# Patient Record
Sex: Male | Born: 1950 | Race: Black or African American | Hispanic: No | Marital: Married | Smoking: Former smoker
Health system: Southern US, Community
[De-identification: ages and names within clinical notes are randomized; demographics above are authoritative.]

## PROBLEM LIST (undated history)

## (undated) DIAGNOSIS — I2510 Atherosclerotic heart disease of native coronary artery without angina pectoris: Secondary | ICD-10-CM

## (undated) DIAGNOSIS — I10 Essential (primary) hypertension: Secondary | ICD-10-CM

## (undated) DIAGNOSIS — R002 Palpitations: Secondary | ICD-10-CM

## (undated) DIAGNOSIS — M1710 Unilateral primary osteoarthritis, unspecified knee: Secondary | ICD-10-CM

## (undated) DIAGNOSIS — Z87442 Personal history of urinary calculi: Secondary | ICD-10-CM

## (undated) DIAGNOSIS — M545 Low back pain, unspecified: Secondary | ICD-10-CM

## (undated) DIAGNOSIS — T7840XA Allergy, unspecified, initial encounter: Secondary | ICD-10-CM

## (undated) DIAGNOSIS — K4090 Unilateral inguinal hernia, without obstruction or gangrene, not specified as recurrent: Secondary | ICD-10-CM

## (undated) DIAGNOSIS — E538 Deficiency of other specified B group vitamins: Secondary | ICD-10-CM

## (undated) DIAGNOSIS — E785 Hyperlipidemia, unspecified: Secondary | ICD-10-CM

## (undated) DIAGNOSIS — E119 Type 2 diabetes mellitus without complications: Secondary | ICD-10-CM

## (undated) DIAGNOSIS — J392 Other diseases of pharynx: Secondary | ICD-10-CM

## (undated) DIAGNOSIS — I779 Disorder of arteries and arterioles, unspecified: Secondary | ICD-10-CM

## (undated) HISTORY — PX: CARDIAC CATHETERIZATION: SHX172

## (undated) HISTORY — DX: Unilateral primary osteoarthritis, unspecified knee: M17.10

## (undated) HISTORY — DX: Hyperlipidemia, unspecified: E78.5

## (undated) HISTORY — DX: Deficiency of other specified B group vitamins: E53.8

## (undated) HISTORY — DX: Low back pain, unspecified: M54.50

## (undated) HISTORY — DX: Essential (primary) hypertension: I10

## (undated) HISTORY — DX: Allergy, unspecified, initial encounter: T78.40XA

## (undated) HISTORY — PX: KNEE ARTHROSCOPY: SHX127

## (undated) HISTORY — DX: Low back pain: M54.5

## (undated) HISTORY — DX: Atherosclerotic heart disease of native coronary artery without angina pectoris: I25.10

## (undated) HISTORY — DX: Disorder of arteries and arterioles, unspecified: I77.9

## (undated) HISTORY — PX: INGUINAL HERNIA REPAIR: SHX194

## (undated) NOTE — *Deleted (*Deleted)
Responded to page, pt unavailable, no family present, staff will call if further chaplain services needed.   Rev. Donnel Saxon Chaplain

---
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
## 1999-01-07 ENCOUNTER — Encounter: Admission: RE | Admit: 1999-01-07 | Discharge: 1999-04-07 | Payer: Self-pay | Admitting: Orthopedic Surgery

## 1999-04-18 ENCOUNTER — Encounter: Payer: Self-pay | Admitting: Orthopedic Surgery

## 1999-04-20 ENCOUNTER — Ambulatory Visit (HOSPITAL_COMMUNITY): Admission: RE | Admit: 1999-04-20 | Discharge: 1999-04-20 | Payer: Self-pay | Admitting: Orthopedic Surgery

## 1999-05-10 ENCOUNTER — Encounter: Admission: RE | Admit: 1999-05-10 | Discharge: 1999-08-04 | Payer: Self-pay | Admitting: Orthopedic Surgery

## 2000-05-02 ENCOUNTER — Encounter: Payer: Self-pay | Admitting: Emergency Medicine

## 2000-05-02 ENCOUNTER — Emergency Department (HOSPITAL_COMMUNITY): Admission: EM | Admit: 2000-05-02 | Discharge: 2000-05-02 | Payer: Self-pay | Admitting: Emergency Medicine

## 2001-05-29 ENCOUNTER — Ambulatory Visit (HOSPITAL_COMMUNITY): Admission: RE | Admit: 2001-05-29 | Discharge: 2001-05-29 | Payer: Self-pay | Admitting: Cardiology

## 2001-05-29 ENCOUNTER — Encounter: Payer: Self-pay | Admitting: Cardiology

## 2004-01-04 ENCOUNTER — Encounter: Admission: RE | Admit: 2004-01-04 | Discharge: 2004-01-04 | Payer: Self-pay | Admitting: Cardiology

## 2004-01-08 DIAGNOSIS — M179 Osteoarthritis of knee, unspecified: Secondary | ICD-10-CM

## 2004-01-08 DIAGNOSIS — M1710 Unilateral primary osteoarthritis, unspecified knee: Secondary | ICD-10-CM

## 2004-01-08 HISTORY — DX: Unilateral primary osteoarthritis, unspecified knee: M17.10

## 2004-01-08 HISTORY — DX: Osteoarthritis of knee, unspecified: M17.9

## 2004-01-14 ENCOUNTER — Inpatient Hospital Stay (HOSPITAL_COMMUNITY): Admission: RE | Admit: 2004-01-14 | Discharge: 2004-01-18 | Payer: Self-pay | Admitting: Orthopedic Surgery

## 2004-01-14 HISTORY — PX: TOTAL KNEE ARTHROPLASTY: SHX125

## 2005-02-17 ENCOUNTER — Ambulatory Visit: Payer: Self-pay | Admitting: Internal Medicine

## 2005-03-14 ENCOUNTER — Ambulatory Visit: Payer: Self-pay | Admitting: Internal Medicine

## 2005-03-21 ENCOUNTER — Ambulatory Visit: Payer: Self-pay | Admitting: Internal Medicine

## 2005-03-28 ENCOUNTER — Ambulatory Visit: Payer: Self-pay | Admitting: Internal Medicine

## 2005-04-21 ENCOUNTER — Encounter: Admission: RE | Admit: 2005-04-21 | Discharge: 2005-04-21 | Payer: Self-pay | Admitting: Orthopedic Surgery

## 2005-05-08 ENCOUNTER — Ambulatory Visit: Payer: Self-pay | Admitting: Internal Medicine

## 2005-05-31 ENCOUNTER — Ambulatory Visit: Payer: Self-pay | Admitting: Internal Medicine

## 2005-07-11 ENCOUNTER — Ambulatory Visit: Payer: Self-pay | Admitting: Internal Medicine

## 2005-10-10 ENCOUNTER — Ambulatory Visit: Payer: Self-pay | Admitting: Internal Medicine

## 2005-10-17 ENCOUNTER — Ambulatory Visit: Payer: Self-pay | Admitting: Internal Medicine

## 2006-02-07 ENCOUNTER — Ambulatory Visit: Payer: Self-pay | Admitting: Internal Medicine

## 2006-02-12 ENCOUNTER — Ambulatory Visit: Payer: Self-pay | Admitting: Internal Medicine

## 2006-02-14 ENCOUNTER — Ambulatory Visit: Payer: Self-pay | Admitting: Internal Medicine

## 2006-04-17 ENCOUNTER — Ambulatory Visit: Payer: Self-pay | Admitting: Internal Medicine

## 2006-08-09 ENCOUNTER — Ambulatory Visit: Payer: Self-pay | Admitting: Internal Medicine

## 2006-08-09 LAB — CONVERTED CEMR LAB
BUN: 10 mg/dL (ref 6–23)
Calcium: 9.8 mg/dL (ref 8.4–10.5)
Chloride: 107 meq/L (ref 96–112)
GFR calc non Af Amer: 67 mL/min
Glomerular Filtration Rate, Af Am: 81 mL/min/{1.73_m2}
Hgb A1c MFr Bld: 6.1 % — ABNORMAL HIGH (ref 4.6–6.0)

## 2006-08-14 ENCOUNTER — Ambulatory Visit: Payer: Self-pay | Admitting: Internal Medicine

## 2006-11-07 ENCOUNTER — Ambulatory Visit: Payer: Self-pay | Admitting: Internal Medicine

## 2006-11-07 LAB — CONVERTED CEMR LAB
ALT: 25 units/L (ref 0–40)
AST: 24 units/L (ref 0–37)
Albumin: 4 g/dL (ref 3.5–5.2)
Alkaline Phosphatase: 40 units/L (ref 39–117)
BUN: 12 mg/dL (ref 6–23)
Bilirubin, Direct: 0.2 mg/dL (ref 0.0–0.3)
Calcium: 9.4 mg/dL (ref 8.4–10.5)
Chloride: 109 meq/L (ref 96–112)
GFR calc Af Amer: 81 mL/min
GFR calc non Af Amer: 67 mL/min
Vitamin B-12: 643 pg/mL (ref 211–911)

## 2006-11-13 ENCOUNTER — Ambulatory Visit: Payer: Self-pay | Admitting: Internal Medicine

## 2007-03-14 ENCOUNTER — Ambulatory Visit: Payer: Self-pay | Admitting: Internal Medicine

## 2007-03-14 LAB — CONVERTED CEMR LAB
BUN: 8 mg/dL (ref 6–23)
Calcium: 9.7 mg/dL (ref 8.4–10.5)
Chloride: 107 meq/L (ref 96–112)
Creatinine, Ser: 1 mg/dL (ref 0.4–1.5)
Hgb A1c MFr Bld: 5.3 % (ref 4.6–6.0)
Potassium: 3.8 meq/L (ref 3.5–5.1)

## 2007-03-18 ENCOUNTER — Ambulatory Visit: Payer: Self-pay | Admitting: Internal Medicine

## 2007-05-10 DIAGNOSIS — E119 Type 2 diabetes mellitus without complications: Secondary | ICD-10-CM

## 2007-05-10 DIAGNOSIS — I10 Essential (primary) hypertension: Secondary | ICD-10-CM | POA: Insufficient documentation

## 2007-05-31 ENCOUNTER — Ambulatory Visit: Payer: Self-pay | Admitting: Internal Medicine

## 2007-05-31 ENCOUNTER — Inpatient Hospital Stay (HOSPITAL_COMMUNITY): Admission: EM | Admit: 2007-05-31 | Discharge: 2007-06-02 | Payer: Self-pay | Admitting: Emergency Medicine

## 2007-07-01 ENCOUNTER — Ambulatory Visit: Payer: Self-pay | Admitting: Cardiology

## 2007-07-09 ENCOUNTER — Ambulatory Visit: Payer: Self-pay

## 2007-07-09 LAB — CONVERTED CEMR LAB
Cholesterol: 363 mg/dL (ref 0–200)
HDL: 27.4 mg/dL — ABNORMAL LOW (ref >39.0)
TSH: 2.14 microintl units/mL (ref 0.35–5.50)
Total CHOL/HDL Ratio: 13.2

## 2007-07-11 ENCOUNTER — Ambulatory Visit: Payer: Self-pay

## 2007-08-26 ENCOUNTER — Encounter: Payer: Self-pay | Admitting: Internal Medicine

## 2007-10-23 ENCOUNTER — Telehealth: Payer: Self-pay | Admitting: Internal Medicine

## 2007-10-28 ENCOUNTER — Ambulatory Visit: Payer: Self-pay | Admitting: Internal Medicine

## 2007-10-28 DIAGNOSIS — M1990 Unspecified osteoarthritis, unspecified site: Secondary | ICD-10-CM

## 2007-10-28 DIAGNOSIS — F411 Generalized anxiety disorder: Secondary | ICD-10-CM | POA: Insufficient documentation

## 2007-10-28 DIAGNOSIS — F1020 Alcohol dependence, uncomplicated: Secondary | ICD-10-CM

## 2007-11-18 ENCOUNTER — Telehealth: Payer: Self-pay | Admitting: Internal Medicine

## 2008-01-15 ENCOUNTER — Encounter: Payer: Self-pay | Admitting: Internal Medicine

## 2008-01-23 ENCOUNTER — Ambulatory Visit: Payer: Self-pay | Admitting: Internal Medicine

## 2008-01-24 LAB — CONVERTED CEMR LAB
Albumin: 3.6 g/dL (ref 3.5–5.2)
BUN: 6 mg/dL (ref 6–23)
Calcium: 9.1 mg/dL (ref 8.4–10.5)
Direct LDL: 103.5 mg/dL
GFR calc Af Amer: 89 mL/min
Glucose, Bld: 275 mg/dL — ABNORMAL HIGH (ref 70–99)
HDL: 40.7 mg/dL (ref >39.0)
Sodium: 139 meq/L (ref 135–145)
Total Protein: 6.9 g/dL (ref 6.0–8.3)
Triglycerides: 462 mg/dL (ref 0–149)
VLDL: 92 mg/dL — ABNORMAL HIGH (ref 0–40)
Vitamin B-12: 499 pg/mL (ref 211–911)

## 2008-01-27 ENCOUNTER — Ambulatory Visit: Payer: Self-pay | Admitting: Internal Medicine

## 2008-01-27 DIAGNOSIS — E559 Vitamin D deficiency, unspecified: Secondary | ICD-10-CM

## 2008-01-27 DIAGNOSIS — E669 Obesity, unspecified: Secondary | ICD-10-CM

## 2008-02-12 ENCOUNTER — Ambulatory Visit: Payer: Self-pay | Admitting: Psychology

## 2008-03-02 ENCOUNTER — Inpatient Hospital Stay (HOSPITAL_COMMUNITY): Admission: RE | Admit: 2008-03-02 | Discharge: 2008-03-04 | Payer: Self-pay | Admitting: Psychiatry

## 2008-03-02 ENCOUNTER — Emergency Department (HOSPITAL_COMMUNITY): Admission: EM | Admit: 2008-03-02 | Discharge: 2008-03-02 | Payer: Self-pay | Admitting: Emergency Medicine

## 2008-03-02 ENCOUNTER — Ambulatory Visit: Payer: Self-pay | Admitting: Psychiatry

## 2008-03-05 ENCOUNTER — Other Ambulatory Visit (HOSPITAL_COMMUNITY): Admission: RE | Admit: 2008-03-05 | Discharge: 2008-05-14 | Payer: Self-pay | Admitting: Psychiatry

## 2008-03-12 ENCOUNTER — Ambulatory Visit: Payer: Self-pay | Admitting: Psychiatry

## 2008-04-09 ENCOUNTER — Ambulatory Visit: Payer: Self-pay | Admitting: Psychiatry

## 2008-04-21 ENCOUNTER — Ambulatory Visit: Payer: Self-pay | Admitting: Internal Medicine

## 2008-04-21 LAB — CONVERTED CEMR LAB
Albumin: 3.5 g/dL (ref 3.5–5.2)
BUN: 7 mg/dL (ref 6–23)
Calcium: 9.5 mg/dL (ref 8.4–10.5)
Creatinine, Ser: 1.1 mg/dL (ref 0.4–1.5)
GFR calc Af Amer: 89 mL/min
Glucose, Bld: 107 mg/dL — ABNORMAL HIGH (ref 70–99)
Hgb A1c MFr Bld: 6.7 % — ABNORMAL HIGH (ref 4.6–6.0)
Total Protein: 6.8 g/dL (ref 6.0–8.3)

## 2008-04-27 ENCOUNTER — Ambulatory Visit: Payer: Self-pay | Admitting: Internal Medicine

## 2008-04-27 LAB — CONVERTED CEMR LAB: Blood Glucose, Fingerstick: 108

## 2008-05-27 ENCOUNTER — Telehealth: Payer: Self-pay | Admitting: Internal Medicine

## 2008-06-24 ENCOUNTER — Ambulatory Visit: Payer: Self-pay | Admitting: Internal Medicine

## 2008-06-24 LAB — CONVERTED CEMR LAB
Calcium: 9.3 mg/dL (ref 8.4–10.5)
GFR calc Af Amer: 62 mL/min
Glucose, Bld: 88 mg/dL (ref 70–99)
Sodium: 143 meq/L (ref 135–145)
Uric Acid, Serum: 6.6 mg/dL (ref 4.0–7.8)
Vitamin B-12: 442 pg/mL (ref 211–911)

## 2008-06-29 ENCOUNTER — Ambulatory Visit: Payer: Self-pay | Admitting: Internal Medicine

## 2008-07-06 ENCOUNTER — Telehealth: Payer: Self-pay | Admitting: Internal Medicine

## 2008-08-04 ENCOUNTER — Telehealth: Payer: Self-pay | Admitting: Internal Medicine

## 2008-10-12 ENCOUNTER — Telehealth: Payer: Self-pay | Admitting: Internal Medicine

## 2008-10-29 ENCOUNTER — Ambulatory Visit: Payer: Self-pay | Admitting: Internal Medicine

## 2008-10-29 LAB — CONVERTED CEMR LAB
ALT: 18 units/L (ref 0–53)
Bilirubin, Direct: 0.1 mg/dL (ref 0.0–0.3)
CO2: 27 meq/L (ref 19–32)
Calcium: 9.4 mg/dL (ref 8.4–10.5)
Glucose, Bld: 109 mg/dL — ABNORMAL HIGH (ref 70–99)
Sodium: 139 meq/L (ref 135–145)
Total Protein: 7.1 g/dL (ref 6.0–8.3)

## 2008-11-02 ENCOUNTER — Ambulatory Visit: Payer: Self-pay | Admitting: Internal Medicine

## 2008-11-02 DIAGNOSIS — L259 Unspecified contact dermatitis, unspecified cause: Secondary | ICD-10-CM

## 2008-11-02 DIAGNOSIS — E538 Deficiency of other specified B group vitamins: Secondary | ICD-10-CM | POA: Insufficient documentation

## 2009-02-15 ENCOUNTER — Telehealth: Payer: Self-pay | Admitting: Internal Medicine

## 2009-02-23 ENCOUNTER — Telehealth: Payer: Self-pay | Admitting: Internal Medicine

## 2009-03-02 ENCOUNTER — Ambulatory Visit: Payer: Self-pay | Admitting: Internal Medicine

## 2009-03-02 DIAGNOSIS — R42 Dizziness and giddiness: Secondary | ICD-10-CM

## 2009-03-02 LAB — CONVERTED CEMR LAB: Blood Glucose, Fingerstick: 120

## 2009-03-03 LAB — CONVERTED CEMR LAB
BUN: 10 mg/dL (ref 6–23)
CO2: 28 meq/L (ref 19–32)
Calcium: 9.1 mg/dL (ref 8.4–10.5)
Creatinine, Ser: 1.1 mg/dL (ref 0.4–1.5)
Direct LDL: 72.5 mg/dL
Glucose, Bld: 141 mg/dL — ABNORMAL HIGH (ref 70–99)
Hgb A1c MFr Bld: 5.8 % (ref 4.6–6.5)
Triglycerides: 286 mg/dL — ABNORMAL HIGH (ref 0.0–149.0)
Vitamin B-12: 279 pg/mL (ref 211–911)

## 2009-05-06 ENCOUNTER — Telehealth: Payer: Self-pay | Admitting: Internal Medicine

## 2009-06-30 ENCOUNTER — Ambulatory Visit: Payer: Self-pay | Admitting: Internal Medicine

## 2009-07-05 ENCOUNTER — Ambulatory Visit: Payer: Self-pay | Admitting: Internal Medicine

## 2009-07-06 LAB — CONVERTED CEMR LAB
AST: 15 units/L (ref 0–37)
Alkaline Phosphatase: 65 units/L (ref 39–117)
GFR calc non Af Amer: 80.16 mL/min (ref >60)
Potassium: 3.8 meq/L (ref 3.5–5.1)
Sodium: 137 meq/L (ref 135–145)
Total Bilirubin: 1 mg/dL (ref 0.3–1.2)

## 2009-11-01 ENCOUNTER — Ambulatory Visit: Payer: Self-pay | Admitting: Internal Medicine

## 2009-11-02 ENCOUNTER — Encounter (INDEPENDENT_AMBULATORY_CARE_PROVIDER_SITE_OTHER): Payer: Self-pay | Admitting: *Deleted

## 2009-11-02 ENCOUNTER — Ambulatory Visit: Payer: Self-pay | Admitting: Internal Medicine

## 2009-11-02 DIAGNOSIS — M25569 Pain in unspecified knee: Secondary | ICD-10-CM

## 2009-11-02 DIAGNOSIS — M545 Low back pain, unspecified: Secondary | ICD-10-CM | POA: Insufficient documentation

## 2009-11-02 LAB — CONVERTED CEMR LAB
ALT: 18 units/L (ref 0–53)
AST: 20 units/L (ref 0–37)
BUN: 9 mg/dL (ref 6–23)
Bilirubin, Direct: 0.2 mg/dL (ref 0.0–0.3)
Blood Glucose, Fingerstick: 91
CO2: 26 meq/L (ref 19–32)
Chloride: 104 meq/L (ref 96–112)
Cholesterol: 171 mg/dL (ref 0–200)
Creatinine, Ser: 1.2 mg/dL (ref 0.4–1.5)
Direct LDL: 97.5 mg/dL
Potassium: 4 meq/L (ref 3.5–5.1)
TSH: 1.38 microintl units/mL (ref 0.35–5.50)
Total Protein: 7.2 g/dL (ref 6.0–8.3)
Triglycerides: 444 mg/dL — ABNORMAL HIGH (ref 0.0–149.0)
Vitamin B-12: 564 pg/mL (ref 211–911)

## 2009-12-01 ENCOUNTER — Encounter (INDEPENDENT_AMBULATORY_CARE_PROVIDER_SITE_OTHER): Payer: Self-pay | Admitting: *Deleted

## 2009-12-06 ENCOUNTER — Encounter (INDEPENDENT_AMBULATORY_CARE_PROVIDER_SITE_OTHER): Payer: Self-pay | Admitting: *Deleted

## 2009-12-06 ENCOUNTER — Ambulatory Visit: Payer: Self-pay | Admitting: Gastroenterology

## 2010-01-26 ENCOUNTER — Ambulatory Visit: Payer: Self-pay | Admitting: Internal Medicine

## 2010-01-26 LAB — CONVERTED CEMR LAB
AST: 21 units/L (ref 0–37)
Albumin: 3.8 g/dL (ref 3.5–5.2)
Alkaline Phosphatase: 63 units/L (ref 39–117)
CO2: 28 meq/L (ref 19–32)
Calcium: 9.2 mg/dL (ref 8.4–10.5)
Glucose, Bld: 93 mg/dL (ref 70–99)
Hgb A1c MFr Bld: 5.9 % (ref 4.6–6.5)
Potassium: 4.1 meq/L (ref 3.5–5.1)
Total Protein: 6.6 g/dL (ref 6.0–8.3)
Vitamin B-12: 502 pg/mL (ref 211–911)

## 2010-01-31 ENCOUNTER — Ambulatory Visit: Payer: Self-pay | Admitting: Internal Medicine

## 2010-01-31 DIAGNOSIS — R079 Chest pain, unspecified: Secondary | ICD-10-CM | POA: Insufficient documentation

## 2010-01-31 DIAGNOSIS — E785 Hyperlipidemia, unspecified: Secondary | ICD-10-CM | POA: Insufficient documentation

## 2010-02-16 ENCOUNTER — Encounter (HOSPITAL_COMMUNITY): Admission: RE | Admit: 2010-02-16 | Discharge: 2010-04-12 | Payer: Self-pay | Admitting: Internal Medicine

## 2010-02-17 ENCOUNTER — Encounter: Payer: Self-pay | Admitting: Cardiology

## 2010-02-17 ENCOUNTER — Ambulatory Visit: Payer: Self-pay | Admitting: Internal Medicine

## 2010-03-15 ENCOUNTER — Telehealth: Payer: Self-pay | Admitting: Internal Medicine

## 2010-05-25 ENCOUNTER — Ambulatory Visit: Payer: Self-pay | Admitting: Internal Medicine

## 2010-05-27 LAB — CONVERTED CEMR LAB
Cholesterol: 168 mg/dL (ref 0–200)
GFR calc non Af Amer: 86.53 mL/min (ref >60)
HDL: 25 mg/dL — ABNORMAL LOW (ref >39.00)
Potassium: 4.2 meq/L (ref 3.5–5.1)
Sodium: 141 meq/L (ref 135–145)
VLDL: 58.2 mg/dL — ABNORMAL HIGH (ref 0.0–40.0)

## 2010-05-30 ENCOUNTER — Ambulatory Visit: Payer: Self-pay | Admitting: Internal Medicine

## 2010-05-30 DIAGNOSIS — R635 Abnormal weight gain: Secondary | ICD-10-CM | POA: Insufficient documentation

## 2010-06-06 ENCOUNTER — Telehealth: Payer: Self-pay | Admitting: Internal Medicine

## 2010-09-20 ENCOUNTER — Ambulatory Visit: Payer: Self-pay | Admitting: Internal Medicine

## 2010-09-21 LAB — CONVERTED CEMR LAB
Albumin: 3.8 g/dL (ref 3.5–5.2)
Alkaline Phosphatase: 71 units/L (ref 39–117)
Basophils Absolute: 0 10*3/uL (ref 0.0–0.1)
CO2: 25 meq/L (ref 19–32)
Calcium: 9.5 mg/dL (ref 8.4–10.5)
Cholesterol: 156 mg/dL (ref 0–200)
Creatinine, Ser: 1.1 mg/dL (ref 0.4–1.5)
Direct LDL: 83 mg/dL
Eosinophils Absolute: 0.3 10*3/uL (ref 0.0–0.7)
Glucose, Bld: 114 mg/dL — ABNORMAL HIGH (ref 70–99)
HDL: 25.5 mg/dL — ABNORMAL LOW (ref >39.00)
Hemoglobin: 16.4 g/dL (ref 13.0–17.0)
Ketones, ur: NEGATIVE mg/dL
Lymphocytes Relative: 36.3 % (ref 12.0–46.0)
MCHC: 34.5 g/dL (ref 30.0–36.0)
Monocytes Absolute: 0.7 10*3/uL (ref 0.1–1.0)
Neutro Abs: 3.3 10*3/uL (ref 1.4–7.7)
RDW: 13.4 % (ref 11.5–14.6)
TSH: 1.17 microintl units/mL (ref 0.35–5.50)
Total Protein, Urine: NEGATIVE mg/dL
Triglycerides: 310 mg/dL — ABNORMAL HIGH (ref 0.0–149.0)
Urine Glucose: NEGATIVE mg/dL
VLDL: 62 mg/dL — ABNORMAL HIGH (ref 0.0–40.0)
pH: 5 (ref 5.0–8.0)

## 2010-10-30 ENCOUNTER — Encounter: Payer: Self-pay | Admitting: Internal Medicine

## 2010-11-08 NOTE — Miscellaneous (Signed)
Summary: [facility] PV  Clinical Lists Changes  Medications: Added new medication of MOVIPREP 100 GM  SOLR (PEG-KCL-NACL-NASULF-NA ASC-C) As per prep instructions. - Signed Rx of MOVIPREP 100 GM  SOLR (PEG-KCL-NACL-NASULF-NA ASC-C) As per prep instructions.;  #1 x 0;  Signed;  Entered by: Ezra Sites RN;  Authorized by: Meryl Dare MD [facility];  Method used: [facility] [address] [phone]*, [address], [place], [place], [place]  [address], Ph: [phone] or [phone], Fax: [phone] Allergies: Added new allergy or adverse reaction of CODEINE Observations: Added new observation of NKA: F (12/06/2009 7:56)    Prescriptions: MOVIPREP 100 GM  SOLR (PEG-KCL-NACL-NASULF-NA ASC-C) As per prep instructions.  #1 x 0   Entered by:   Ezra Sites RN   Authorized by:   Meryl Dare MD [facility]   Signed by:   Ezra Sites RN on 12/06/2009   Method used:   Electronically to        [facility] [address] [phone]* (retail)       [address]       [place], [place]  [address]       Ph: [phone] or [phone]       Fax: [phone]   RxID:   [id number]

## 2010-11-08 NOTE — Assessment & Plan Note (Signed)
Summary: 3 mos f/u #/ cd   Vital Signs:  Patient profile:   60 year old male Weight:      354 pounds Temp:     98.0 degrees F Pulse rate:   82 / minute BP sitting:   124 / 70  (left arm) Cuff size:   large  Vitals Entered By: Lamar Sprinkles, CMA (January 31, 2010 9:45 AM) CC: F/U   CC:  F/U.  History of Present Illness: The patient presents for a follow up of hypertension, diabetes, hyperlipidemia C/o CP x 10 - 20 sec every 3 wks, pulsating 5/10  Current Medications (verified): 1)  Furosemide 40 Mg Tabs (Furosemide) .... Once Daily 2)  Klor-Con M20 20 Meq  Tbcr (Potassium Chloride Crys Cr) .... Take 1 Tab By Mouth Daily 3)  Glimepiride 2 Mg Tabs (Glimepiride) .Marland Kitchen.. 1 By Mouth Two Times A Day 4)  Etodolac 400 Mg Tabs (Etodolac) .Marland Kitchen.. 1 By Mouth Two Times A Day Pc 5)  Vitamin D3 1000 Unit  Tabs (Cholecalciferol) .Marland Kitchen.. 1 By Mouth Daily 6)  Vitamin B-12 100 Mcg Tabs (Cyanocobalamin) .... Once Daily 7)  Amlodipine Besylate 10 Mg Tabs (Amlodipine Besylate) .Marland Kitchen.. 1 By Mouth Daily 8)  Lotensin 40 Mg Tabs (Benazepril Hcl) .Marland Kitchen.. 1 By Mouth Daily 9)  Triamcinolone Acetonide 0.1 % Oint (Triamcinolone Acetonide) .... Use 1-2 Times A Day As Needed 10)  Cyclobenzaprine Hcl 10 Mg Tabs (Cyclobenzaprine Hcl) .... 1/2-1 Tab By Mouth Two Times A Day As Needed Muscle Spasms 11)  Aspir-Low 81 Mg Tbec (Aspirin) .Marland Kitchen.. 1 Once Daily  Allergies (verified): 1)  ! 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 Catrter  Family History: Last updated: 01/27/2008 Family History Hypertension  Social History: Last updated: 04/27/2008 Occupation : disabled due to knees Married Never Smoked Alcohol use-yes 2 gal of Vodka/week.  8 wks 11ff ETOH 7/9  Review of  Systems  The patient denies fever, weight loss, weight gain, prolonged cough, and abdominal pain.    Physical Exam  General:  overweight-appearing.   Nose:  WNL Mouth:  Oral mucosa and oropharynx without lesions or exudates.  Teeth in good repair. Lungs:  Normal respiratory effort, chest expands symmetrically. Lungs are clear to auscultation, no crackles or wheezes. Heart:  Normal rate and regular rhythm. S1 and S2 normal without gallop, murmur, click, rub or other extra sounds. Abdomen:  Bowel sounds positive,abdomen soft and non-tender without masses, organomegaly or hernias noted. Msk:  Lumbar-sacral spine is tender to palpation over paraspinal muscles and painfull with the ROM   Neurologic:  alert & oriented X3 and gait normal.   Skin:  Intact without suspicious lesions or rashes Psych:  Cognition and judgment appear intact. Alert and cooperative with normal attention span and concentration. No apparent delusions, illusions, hallucinations   Impression & Recommendations:  Problem # 1:  CHEST PAIN (ICD-786.50) atypical Assessment New  Orders: EKG w/ Interpretation (93000) Cardiolite (Cardiolite)  Problem # 2:  DYSLIPIDEMIA (ICD-272.4) Assessment: Deteriorated  Problem # 3:  LOW BACK PAIN (ICD-724.2) Assessment: Unchanged  His updated medication list for this problem includes:    Etodolac 400 Mg Tabs (Etodolac) .Marland Kitchen... 1 by mouth two times a day pc    Cyclobenzaprine Hcl 10 Mg Tabs (Cyclobenzaprine hcl) .Marland Kitchen... 1/2-1 tab by mouth two  times a day as needed muscle spasms    Aspir-low 81 Mg Tbec (Aspirin) .Marland Kitchen... 1 once daily  Problem # 4:  KNEE PAIN (ICD-719.46) Assessment: Unchanged  His updated medication list for this problem includes:    Etodolac 400 Mg Tabs (Etodolac) .Marland Kitchen... 1 by mouth two times a day pc    Cyclobenzaprine Hcl 10 Mg Tabs (Cyclobenzaprine hcl) .Marland Kitchen... 1/2-1 tab by mouth two times a day as needed muscle spasms    Aspir-low 81 Mg Tbec (Aspirin) .Marland Kitchen... 1 once  daily  Problem # 5:  B12 DEFICIENCY (ICD-266.2) Assessment: Unchanged On prescription/OTC  therapy   Problem # 6:  HYPERTENSION (ICD-401.9) Assessment: Improved  His updated medication list for this problem includes:    Furosemide 40 Mg Tabs (Furosemide) ..... Once daily    Amlodipine Besylate 10 Mg Tabs (Amlodipine besylate) .Marland Kitchen... 1 by mouth daily    Lotensin 40 Mg Tabs (Benazepril hcl) .Marland Kitchen... 1 by mouth daily  BP today: 124/70 Prior BP: 116/74 (11/02/2009)  Labs Reviewed: K+: 4.1 (01/26/2010) Creat: : 1.1 (01/26/2010)   Chol: 138 (01/26/2010)   HDL: 24.50 (01/26/2010)   LDL: DEL (01/23/2008)   TG: 435.0 Triglyceride is over 400; calculations on Lipids are invalid. mg/dL (16/07/9603)  Problem # 7:  DIABETES MELLITUS, TYPE II (ICD-250.00) Assessment: Improved  His updated medication list for this problem includes:    Glimepiride 2 Mg Tabs (Glimepiride) .Marland Kitchen... 1 by mouth two times a day    Lotensin 40 Mg Tabs (Benazepril hcl) .Marland Kitchen... 1 by mouth daily    Aspir-low 81 Mg Tbec (Aspirin) .Marland Kitchen... 1 once daily  Complete Medication List: 1)  Furosemide 40 Mg Tabs (Furosemide) .... Once daily 2)  Klor-con M20 20 Meq Tbcr (Potassium chloride crys cr) .... Take 1 tab by mouth daily 3)  Glimepiride 2 Mg Tabs (Glimepiride) .Marland Kitchen.. 1 by mouth two times a day 4)  Etodolac 400 Mg Tabs (Etodolac) .Marland Kitchen.. 1 by mouth two times a day pc 5)  Vitamin D3 1000 Unit Tabs (Cholecalciferol) .Marland Kitchen.. 1 by mouth daily 6)  Vitamin B-12 100 Mcg Tabs (Cyanocobalamin) .... Once daily 7)  Amlodipine Besylate 10 Mg Tabs (Amlodipine besylate) .Marland Kitchen.. 1 by mouth daily 8)  Lotensin 40 Mg Tabs (Benazepril hcl) .Marland Kitchen.. 1 by mouth daily 9)  Triamcinolone Acetonide 0.1 % Oint (Triamcinolone acetonide) .... Use 1-2 times a day as needed 10)  Cyclobenzaprine Hcl 10 Mg Tabs (Cyclobenzaprine hcl) .... 1/2-1 tab by mouth two times a day as needed muscle spasms 11)  Aspir-low 81 Mg Tbec (Aspirin) .Marland Kitchen.. 1 once daily  Patient Instructions: 1)   Please schedule a follow-up appointment in 4 months. 2)  BMP prior to visit, ICD-9: 3)  Lipid Panel prior to visit, ICD-9: 4)  HbgA1C prior to visit, ICD-9:272.20

## 2010-11-08 NOTE — Progress Notes (Signed)
  Phone Note Refill Request   Refills Requested: Medication #1:  FUROSEMIDE 40 MG TABS once daily  Medication #2:  KLOR-CON M20 20 MEQ  TBCR Take 1 tab by mouth daily  Medication #3:  AMLODIPINE BESYLATE 10 MG TABS 1 by mouth daily Initial call taken by: Ami Bullins CMA,  March 15, 2010 2:51 PM    Prescriptions: LOTENSIN 40 MG TABS (BENAZEPRIL HCL) 1 by mouth daily  #30 Tablet x 9   Entered by:   Ami Bullins CMA   Authorized by:   Tresa Garter MD   Signed by:   Bill Salinas CMA on 03/15/2010   Method used:   Faxed to ...       [facility] [address] [phone]* (retail)       [address]       [place], [place]  [address]       Ph: [phone] or [phone]       Fax: [phone]   RxID:   [id number] AMLODIPINE BESYLATE 10 MG TABS (AMLODIPINE BESYLATE) 1 by mouth daily  #30 Tablet x 9   Entered by:   Ami Bullins CMA   Authorized by:   Tresa Garter MD   Signed by:   Bill Salinas CMA on 03/15/2010   Method used:   Faxed to ...       [facility] [address] [phone]* (retail)       [address]       [place], [place]  [address]       Ph: [phone] or [phone]       Fax: [phone]   RxID:   [id number] KLOR-CON M20 20 MEQ  TBCR (POTASSIUM CHLORIDE CRYS CR) Take 1 tab by mouth daily  #30 Tablet x 10   Entered by:   Ami Bullins CMA   Authorized by:   Tresa Garter MD   Signed by:   Bill Salinas CMA on 03/15/2010   Method used:   Faxed to ...       [facility] [address] [phone]* (retail)       [address]       [place], [place]  [address]       Ph: [phone] or [phone]       Fax: [phone]   RxID:   [id number] FUROSEMIDE 40 MG TABS (FUROSEMIDE) once daily  #30 Tablet x 8   Entered by:   Ami Bullins CMA   Authorized by:   Tresa Garter MD   Signed by:   Bill Salinas CMA on 03/15/2010   Method used:   Faxed to ...       [facility] [address] [phone]*  (retail)       [address]       [place], [place]  [address]       Ph: [phone] or [phone]       Fax: [phone]   RxID:   [id number]

## 2010-11-08 NOTE — Assessment & Plan Note (Signed)
Summary: 4 MONTH FOLLOW UP-LB   Vital Signs:  Patient profile:   60 year old male Weight:      369 pounds O2 Sat:      95 % Temp:     98 degrees F Pulse rate:   76 / minute BP sitting:   145 / 80  History of Present Illness: C/o wt gain - he had been on bad diet The patient presents for a follow up of hypertension, diabetes, hyperlipidemia   Allergies: 1)  ! 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 Catrter  Social History: Last updated: 04/27/2008 Occupation : disabled due to knees Married Never Smoked Alcohol use-yes 2 gal of Vodka/week.  8 wks 41ff ETOH 7/9  Review of Systems  The patient denies fever, dyspnea on exertion, and abdominal pain.    Physical Exam  General:  overweight-appearing.   Nose:  WNL Mouth:  Oral mucosa and oropharynx without lesions or exudates.  Teeth in good repair. Lungs:  Normal respiratory effort, chest expands symmetrically. Lungs are clear to auscultation, no crackles or wheezes. Heart:  Normal rate and regular rhythm. S1 and S2 normal without gallop, murmur, click, rub or other extra sounds. Abdomen:  Bowel sounds positive,abdomen soft and non-tender without masses, organomegaly or hernias noted. Msk:  Lumbar-sacral spine is tender to palpation over paraspinal muscles and painfull with the ROM   Neurologic:  alert & oriented X3 and gait normal.   Skin:  Intact without suspicious lesions or rashes Psych:  Cognition and judgment appear intact. Alert and cooperative with normal attention span and concentration. No apparent delusions, illusions, hallucinations   Impression & Recommendations:  Problem # 1:  LOW BACK PAIN (ICD-724.2) Assessment Unchanged  His updated medication list  for this problem includes:    Etodolac 400 Mg Tabs (Etodolac) .Marland Kitchen... 1 by mouth two times a day pc    Cyclobenzaprine Hcl 10 Mg Tabs (Cyclobenzaprine hcl) .Marland Kitchen... 1/2-1 tab by mouth two times a day as needed muscle spasms    Aspir-low 81 Mg Tbec (Aspirin) .Marland Kitchen... 1 once daily  Problem # 2:  KNEE PAIN (ICD-719.46) Assessment: Unchanged  His updated medication list for this problem includes:    Etodolac 400 Mg Tabs (Etodolac) .Marland Kitchen... 1 by mouth two times a day pc    Cyclobenzaprine Hcl 10 Mg Tabs (Cyclobenzaprine hcl) .Marland Kitchen... 1/2-1 tab by mouth two times a day as needed muscle spasms    Aspir-low 81 Mg Tbec (Aspirin) .Marland Kitchen... 1 once daily  Problem # 3:  B12 DEFICIENCY (ICD-266.2) Assessment: Comment Only On the regimen of medicine(s) reflected in the chart    Problem # 4:  ALCOHOLISM (ICD-303.90) Assessment: Improved Not drinking  Problem # 5:  VITAMIN D DEFICIENCY (ICD-268.9) Assessment: Unchanged On the regimen of medicine(s) reflected in the chart    Problem # 6:  WEIGHT GAIN, ABNORMAL (ICD-783.1) Assessment: Deteriorated Improve diet  Complete Medication List: 1)  Furosemide 40 Mg Tabs (Furosemide) .... Once daily 2)  Klor-con M20 20 Meq Tbcr (Potassium chloride crys cr) .... Take 1 tab by mouth daily 3)  Glimepiride 2 Mg Tabs (Glimepiride) .Marland Kitchen.. 1 by mouth two times a day 4)  Etodolac 400 Mg Tabs (Etodolac) .Marland Kitchen.. 1 by mouth two times a day pc 5)  Vitamin D3 1000 Unit  Tabs (Cholecalciferol) .Marland Kitchen.. 1 by mouth daily 6)  Vitamin B-12 100 Mcg Tabs (Cyanocobalamin) .... Once daily 7)  Amlodipine Besylate 10 Mg Tabs (Amlodipine besylate) .Marland Kitchen.. 1 by mouth daily 8)  Lotensin 40 Mg Tabs (Benazepril hcl) .Marland Kitchen.. 1 by mouth daily 9)  Triamcinolone Acetonide 0.1 % Oint (Triamcinolone acetonide) .... Use 1-2 times a day as needed 10)  Cyclobenzaprine Hcl 10 Mg Tabs (Cyclobenzaprine hcl) .... 1/2-1 tab by mouth two times a day as needed muscle spasms 11)  Aspir-low 81 Mg Tbec (Aspirin) .Marland Kitchen.. 1 once daily 12)   Glimepiride 2 Mg Tabs (Glimepiride) .... Take 1 two times a day  Patient Instructions: 1)  Please schedule a follow-up appointment in 4 months well w/labs and A1c 250.00, vit B12 266.20 and Vit D 268.9 2)  Loose weight! 3)  Use stretching and balance exercises that I have provided (15 min. or longer every day)  Prescriptions: GLIMEPIRIDE 2 MG TABS (GLIMEPIRIDE) take 1 two times a day  #60 x 12   Entered and Authorized by:   Tresa Garter MD   Signed by:   Tresa Garter MD on 05/30/2010   Method used:   Electronically to        [facility] [address] [phone]* (retail)       [address]       [place], [place]  [address]       Ph: [phone] or [phone]       Fax: [phone]   RxID:   [id number] CYCLOBENZAPRINE HCL 10 MG TABS (CYCLOBENZAPRINE HCL) 1/2-1 tab by mouth two times a day as needed muscle spasms  #60 x 12   Entered and Authorized by:   Tresa Garter MD   Signed by:   Tresa Garter MD on 05/30/2010   Method used:   Electronically to        [facility] [address] [phone]* (retail)       [address]       [place], [place]  [address]       Ph: [phone] or [phone]       Fax: [phone]   RxID:   [id number] ETODOLAC 400 MG TABS (ETODOLAC) 1 by mouth two times a day pc  #60 x 12   Entered and Authorized by:   Tresa Garter MD   Signed by:   Tresa Garter MD on 05/30/2010   Method used:   Electronically to        [facility] [address] [phone]* (retail)       [address]       [place], [place]  [address]       Ph: [phone] or [phone]       Fax: [phone]   RxID:   [id number]

## 2010-11-08 NOTE — Progress Notes (Signed)
Summary: RESULTS  Phone Note Call from Patient Call back at Home Phone [phone]   Summary of Call: Patient is requesting results of stress test from May. Says he forgot to ask at office visit. Please advise.  Initial call taken by: Lamar Sprinkles, CMA,  June 06, 2010 10:05 AM  Follow-up for Phone Call        It was nl - good news! Follow-up by: Tresa Garter MD,  June 06, 2010 5:29 PM  Additional Follow-up for Phone Call Additional follow up Details #1::        Pt informed  Additional Follow-up by: Lamar Sprinkles, CMA,  June 07, 2010 9:34 AM

## 2010-11-08 NOTE — Procedures (Signed)
Summary: Instructions for procedure/[facility] (out pt)  Instructions for procedure/[facility] (out pt)   Imported By: Sherian Rein 12/08/2009 08:51:52  _____________________________________________________________________  External Attachment:    Type:   Image     Comment:   External Document

## 2010-11-08 NOTE — Letter (Signed)
Summary: Diabetic Instructions  [facility]  [address] [place], [place] [address]   Phone: [phone]  Fax: [phone]    Brian Mccullough 1951/03/12 MRN: [id number]   _  _   ORAL DIABETIC MEDICATION INSTRUCTIONS  The day before your procedure:   Take your diabetic pill as you do normally  The day of your procedure:   Do not take your diabetic pill    We will check your blood sugar levels during the admission process and again in Recovery before discharging you home  ________________________________________________________________________

## 2010-11-08 NOTE — Letter (Signed)
Summary: [facility] Instructions  [facility]  [address] [place], [place] [address]   Phone: [phone]  Fax: [phone]       Brian Mccullough    20-Nov-1950    MRN: [id number]        Procedure Day Dorna Bloom:  Wednesday 01/12/2010     Arrival Time:  7:30am     Procedure Time:  8:30am     Location of Procedure:                    _X _  [facility] (4th Floor)                        PREPARATION FOR COLONOSCOPY WITH MOVIPREP   Starting 5 days prior to your procedure   Friday April 1  do not eat nuts, seeds, popcorn, corn, beans, peas,  salads, or any raw vegetables.  Do not take any fiber supplements (e.g. Metamucil, Citrucel, and Benefiber).  THE DAY BEFORE YOUR PROCEDURE         DATE: Tuesday April 5  1.  Drink clear liquids the entire day-NO SOLID FOOD  2.  Do not drink anything colored red or purple.  Avoid juices with pulp.  No orange juice.  3.  Drink at least 64 oz. (8 glasses) of fluid/clear liquids during the day to prevent dehydration and help the prep work efficiently.  CLEAR LIQUIDS INCLUDE: Water Jello Ice Popsicles Tea (sugar ok, no milk/cream) Powdered fruit flavored drinks Coffee (sugar ok, no milk/cream) Gatorade Juice: apple, white grape, white cranberry  Lemonade Clear bullion, consomm, broth Carbonated beverages (any kind) Strained chicken noodle soup Hard Candy                             4.  In the morning, mix first dose of MoviPrep solution:    Empty 1 Pouch A and 1 Pouch B into the disposable container    Add lukewarm drinking water to the top line of the container. Mix to dissolve    Refrigerate (mixed solution should be used within 24 hrs)  5.  Begin drinking the prep at 5:00 p.m. The MoviPrep container is divided by 4 marks.   Every 15 minutes drink the solution down to the next mark (approximately 8 oz) until the full liter is complete.   6.  Follow completed prep with 16 oz of clear liquid of your choice  (Nothing red or purple).  Continue to drink clear liquids until bedtime.  7.  Before going to bed, mix second dose of MoviPrep solution:    Empty 1 Pouch A and 1 Pouch B into the disposable container    Add lukewarm drinking water to the top line of the container. Mix to dissolve    Refrigerate  THE DAY OF YOUR PROCEDURE      DATE:  Wednesday April 6  Beginning at  3:30 a.m. (5 hours before procedure):         1. Every 15 minutes, drink the solution down to the next mark (approx 8 oz) until the full liter is complete.  2. Follow completed prep with 16 oz. of clear liquid of your choice.    3. You may drink clear liquids until 4:30 am (4 HOURS BEFORE PROCEDURE).   MEDICATION INSTRUCTIONS  Unless otherwise instructed, you should take regular prescription medications with a small sip of water  as early as possible the morning of your procedure.  Diabetic patients - see separate instructions.  Additional medication instructions: Do not take Furosemide morning of procedure.         OTHER INSTRUCTIONS  You will need a responsible adult at least 59 years of age to accompany you and drive you home.   This person must remain in the waiting room during your procedure.  Wear loose fitting clothing that is easily removed.  Leave jewelry and other valuables at home.  However, you may wish to bring a book to read or  an iPod/MP3 player to listen to music as you wait for your procedure to start.  Remove all body piercing jewelry and leave at home.  Total time from sign-in until discharge is approximately 2-3 hours.  You should go home directly after your procedure and rest.  You can resume normal activities the  day after your procedure.  The day of your procedure you should not:   Drive   Make legal decisions   Operate machinery   Drink alcohol   Return to work  You will receive specific instructions about eating, activities and medications before you  leave.    The above instructions have been reviewed and explained to me by   Ezra Sites RN  December 06, 2009 8:27 AM    I fully understand and can verbalize these instructions _____________________________ Date _________

## 2010-11-08 NOTE — Assessment & Plan Note (Signed)
Summary: Brian Mccullough   Vital Signs:  Patient profile:   60 year old male Weight:      348 pounds Temp:     98.Brian degrees F oral Pulse rate:   82 / minute BP sitting:   116 / 74  (left arm)  Vitals Entered By: Tora Perches (November 02, 2009 11:12 AM) CC: f/u Is Patient Diabetic? Yes CBG Result 91   CC:  f/u.  History of Present Illness: The patient presents for a follow up of hypertension, diabetes, hyperlipidemia, OA   Preventive Screening-Counseling & Management  Alcohol-Tobacco     Smoking Status: never  Current Medications (verified): 1)  Furosemide 40 Mg Tabs (Furosemide) .... Once Daily Prn 2)  Klor-Con M20 20 Meq  Tbcr (Potassium Chloride Crys Cr) .... Take 1 Tab By Mouth Daily 3)  Glimepiride 2 Mg Tabs (Glimepiride) .Marland Kitchen.. 1 By Mouth Bid Brian)  Etodolac 400 Mg Tabs (Etodolac) .Marland Kitchen.. 1 By Mouth Once Daily Pc 5)  Vitamin D3 1000 Unit  Tabs (Cholecalciferol) .Marland Kitchen.. 1 By Mouth Daily 6)  B-100 Complex   Tabs (Vitamins-Lipotropics) .Marland Kitchen.. 1 By Mouth Qd 7)  Amlodipine Besylate 10 Mg Tabs (Amlodipine Besylate) .Marland Kitchen.. 1 By Mouth Daily 8)  Lotensin 40 Mg Tabs (Benazepril Hcl) .Marland Kitchen.. 1 By Mouth Daily 9)  Triamcinolone Acetonide 0.1 % Oint (Triamcinolone Acetonide) .... Use 1-2 Times A Day 10)  Cyclobenzaprine Hcl 10 Mg Tabs (Cyclobenzaprine Hcl) .... Two Times A Day  Allergies (verified): No Known Drug Allergies  Past History:  Social History: Last updated: 04/27/2008 Occupation : disabled due to knees Married Never Smoked Alcohol use-yes 2 gal of Vodka/week.  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 Catrter  Review of Systems       The patient complains of difficulty walking.  The patient denies fever, dyspnea on exertion, abdominal pain, and melena.    Physical  Exam  General:  overweight-appearing.   Nose:  WNL Mouth:  Oral mucosa and oropharynx without lesions or exudates.  Teeth in good repair. Lungs:  Normal respiratory effort, chest expands symmetrically. Lungs are clear to auscultation, no crackles or wheezes. Heart:  Normal rate and regular rhythm. S1 and S2 normal without gallop, murmur, click, rub or other extra sounds. Abdomen:  Bowel sounds positive,abdomen soft and non-tender without masses, organomegaly or hernias noted. Msk:  Lumbar-sacral spine is tender to palpation over paraspinal muscles and painfull with the ROM   Neurologic:  alert & oriented X3 and gait normal.   Skin:  Intact without suspicious lesions or rashes Psych:  Cognition and judgment appear intact. Alert and cooperative with normal attention span and concentration. No apparent delusions, illusions, hallucinations   Impression & Recommendations:  Problem # 1:  LOW BACK PAIN (ICD-724.2)  The following medications were removed from the medication list:    Cyclobenzaprine Hcl 10 Mg Tabs (Cyclobenzaprine hcl) .Marland Kitchen..Marland Kitchen Two times a day His updated medication list for this problem includes:    Etodolac 400 Mg Tabs (Etodolac) .Marland Kitchen... 1 by mouth bid pc    Cyclobenzaprine Hcl 10 Mg Tabs (Cyclobenzaprine hcl) .Marland Kitchen... 1/2-1 tab by mouth two times a day as needed muscle spasms  Problem # 2:  KNEE PAIN (ICD-719.46)  The following medications were removed from the medication list:    Cyclobenzaprine Hcl  10 Mg Tabs (Cyclobenzaprine hcl) .Marland Kitchen..Marland Kitchen Two times a day His updated medication list for this problem includes:    Etodolac 400 Mg Tabs (Etodolac) .Marland Kitchen... 1 by mouth bid pc    Cyclobenzaprine Hcl 10 Mg Tabs (Cyclobenzaprine hcl) .Marland Kitchen... 1/2-1 tab by mouth two times a day as needed muscle spasms  Problem # 3:  B12 DEFICIENCY (ICD-266.2) Assessment: Improved  Problem # Brian:  ALCOHOLISM (ICD-303.90) Assessment: Improved  Problem # 5:  HYPERTENSION (ICD-401.9) Assessment: Improved  His  updated medication list for this problem includes:    Furosemide 40 Mg Tabs (Furosemide) ..... Once daily prn    Amlodipine Besylate 10 Mg Tabs (Amlodipine besylate) .Marland Kitchen... 1 by mouth daily    Lotensin 40 Mg Tabs (Benazepril hcl) .Marland Kitchen... 1 by mouth daily  Problem # 6:  DIABETES MELLITUS, TYPE II (ICD-250.00) Assessment: Unchanged  His updated medication list for this problem includes:    Glimepiride 2 Mg Tabs (Glimepiride) .Marland Kitchen... 1 by mouth bid    Lotensin 40 Mg Tabs (Benazepril hcl) .Marland Kitchen... 1 by mouth daily Labs is pending   Complete Medication List: 1)  Furosemide 40 Mg Tabs (Furosemide) .... Once daily prn 2)  Klor-con M20 20 Meq Tbcr (Potassium chloride crys cr) .... Take 1 tab by mouth daily 3)  Glimepiride 2 Mg Tabs (Glimepiride) .Marland Kitchen.. 1 by mouth bid Brian)  Etodolac 400 Mg Tabs (Etodolac) .Marland Kitchen.. 1 by mouth bid pc 5)  Vitamin D3 1000 Unit Tabs (Cholecalciferol) .Marland Kitchen.. 1 by mouth daily 6)  B-100 Complex Tabs (Vitamins-lipotropics) .Marland Kitchen.. 1 by mouth qd 7)  Amlodipine Besylate 10 Mg Tabs (Amlodipine besylate) .Marland Kitchen.. 1 by mouth daily 8)  Lotensin 40 Mg Tabs (Benazepril hcl) .Marland Kitchen.. 1 by mouth daily 9)  Triamcinolone Acetonide 0.1 % Oint (Triamcinolone acetonide) .... Use 1-2 times a day 10)  Cyclobenzaprine Hcl 10 Mg Tabs (Cyclobenzaprine hcl) .... 1/2-1 tab by mouth two times a day as needed muscle spasms  Other Orders: TD Toxoids IM 7 YR + (84132) Pneumococcal Vaccine (44010) Admin 1st Vaccine (27253) Admin of Any Addtl Vaccine (66440) Admin 1st Vaccine (State) [phone]) Admin of Any Addtl Vaccine (State) (95638V) Gastroenterology Referral (GI)  Patient Instructions: 1)  Please schedule a follow-up appointment in 3 months. 2)  BMP prior to visit, ICD-9: 3)  Hepatic Panel prior to visit, ICD-9: Brian)  Lipid Panel prior to visit, ICD-9: 5)  TSH prior to visit, ICD-9: 6)  HbgA1C prior to visit, ICD-9: 7)  Vit B12 266.20  250.00 Prescriptions: CYCLOBENZAPRINE HCL 10 MG TABS (CYCLOBENZAPRINE HCL)  1/2-1 tab by mouth two times a day as needed muscle spasms  #60 x 6   Entered and Authorized by:   Tresa Garter MD   Signed by:   Tresa Garter MD on 11/02/2009   Method used:   [facility]ically to        [facility] [address] [phone]* (retail)       [address]       [place], [place]  [address]       Ph: [phone] or [phone]       Fax: [phone]   RxID:   [id number] ETODOLAC 400 MG TABS (ETODOLAC) 1 by mouth bid pc  #60 x 6   Entered and Authorized by:   Tresa Garter MD   Signed by:   Tresa Garter MD on 11/02/2009   Method used:   [facility] [address] [phone]* (retail)       [address]       [place], [place]  [address]       Ph: [phone] or [phone]       Fax: [phone]   RxID:   [id number]    Tetanus/Td Vaccine    Vaccine Type: Td    Site: left deltoid    Mfr: Sanofi Pasteur    Dose: 0.5 ml    Route: IM    Given by: Tora Perches    Exp. Date: 08/24/2011    Lot #: [id number]    VIS given: 08/27/07 version given November 02, 2009.  Pneumovax Vaccine    Vaccine Type: Pneumovax    Site: right deltoid    Mfr: Merck    Dose: 0.5 ml    Route: IM    Given by: Tora Perches    Exp. Date: 11/09/2010    Lot #: [id number]    VIS given: 05/06/96 version given November 02, 2009.

## 2010-11-08 NOTE — Letter (Signed)
Summary: Previsit letter  [facility]  [address] [place], [place] [address]   Phone: [phone]  Fax: [phone]       11/02/2009 MRN: [id number]  [facility] [address] [place], [place]  [address]  Dear [name],  Welcome to the Gastroenterology Division at [facility].    You are scheduled to see a nurse for your pre-procedure visit on 12/06/2009 at 8:00AM on the 3rd floor at [facility], [address]  We ask that you try to arrive at our office 15 minutes prior to your appointment time to allow for check-in.  Your nurse visit will consist of discussing your medical and surgical history, your immediate family medical history, and your medications.    Please bring a complete list of all your medications or, if you prefer, bring the medication bottles and we will list them.  We will need to be aware of both prescribed and over the counter drugs.  We will need to know exact dosage information as well.  If you are on blood thinners (Coumadin, Plavix, Aggrenox, Ticlid, etc.) please call our office today/prior to your appointment, as we need to consult with your physician about holding your medication.   Please be prepared to read and sign documents such as consent forms, a financial agreement, and acknowledgement forms.  If necessary, and with your consent, a friend or relative is welcome to sit-in on the nurse visit with you.  Please bring your insurance card so that we may make a copy of it.  If your insurance requires a referral to see a specialist, please bring your referral form from your primary care physician.  No co-pay is required for this nurse visit.     If you cannot keep your appointment, please call [phone] to cancel or reschedule prior to your appointment date.  This allows [place] the opportunity to schedule an appointment for another patient in need of care.    Thank you for choosing [facility] for your medical  needs.  We appreciate the opportunity to care for you.  Please visit [place] at our website  to learn more about our practice.                     Sincerely.                                                                                                                   The Gastroenterology Division

## 2010-11-10 NOTE — Assessment & Plan Note (Signed)
Summary: CPX/MEDICARE/#./CD   Vital Signs:  Patient profile:   60 year old male Height:      75 inches Weight:      365 pounds BMI:     45.79 Temp:     98.6 degrees F oral Pulse rate:   96 / minute Pulse rhythm:   regular Resp:     16 per minute BP sitting:   144 / 78  (left arm) Cuff size:   large  Vitals Entered By: Lanier Prude, Beverly Gust) (September 20, 2010 9:29 AM) CC: MWV Is Patient Diabetic? Yes   CC:  MWV.  History of Present Illness: The patient presents for a preventive health examination   Preventive Screening-Counseling & Management  Alcohol-Tobacco     Alcohol drinks/day: 0     Smoking Status: former  Caffeine-Diet-Exercise     Caffeine use/day: 2     Does Patient Exercise: yes     Type of exercise: stationary bike     Times/week: 3  Hep-HIV-STD-Contraception     Dental Visit-last 6 months no     TSE monthly: no     Sun Exposure-Excessive: no  Safety-Violence-Falls     Seat Belt Use: yes     Helmet Use: n/a     Firearms in the Home: no firearms in the home     Smoke Detectors: yes     Violence in the Home: no risk noted     Sexual Abuse: no     Fall Risk: no  Allergies: 1)  ! 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 Catrter  Family History: Last updated: 01/27/2008 Family History Hypertension  Social History: Last updated: 04/27/2008 Occupation : disabled due to knees Married Never Smoked Alcohol use-yes 2 gal of Vodka/week.  8 wks 40ff ETOH 7/9  Social History: Smoking Status:  former Caffeine use/day:  2 Does Patient Exercise:  yes Dental Care w/in 6 mos.:  no Sun Exposure-Excessive:  no Seat Belt Use:  yes Fall Risk:  no  Review of Systems       The patient complains  of difficulty walking.  The patient denies anorexia, fever, weight loss, weight gain, vision loss, decreased hearing, hoarseness, chest pain, syncope, dyspnea on exertion, peripheral edema, prolonged cough, headaches, hemoptysis, abdominal pain, melena, hematochezia, severe indigestion/heartburn, hematuria, incontinence, genital sores, muscle weakness, suspicious skin lesions, transient blindness, depression, unusual weight change, abnormal bleeding, enlarged lymph nodes, angioedema, and testicular masses.         LBP  Physical Exam  General:  overweight-appearing.   Eyes:  No corneal or conjunctival inflammation noted. EOMI. Perrla. Funduscopic exam benign, without hemorrhages, exudates or papilledema. Vision grossly normal. Ears:  External ear exam shows no significant lesions or deformities.  Otoscopic examination reveals clear canals, tympanic membranes are intact bilaterally without bulging, retraction, inflammation or discharge. Hearing is grossly normal bilaterally. Nose:  WNL Mouth:  Oral mucosa and oropharynx without lesions or exudates.  Teeth in good repair. Lungs:  Normal respiratory effort, chest expands symmetrically. Lungs are clear to auscultation, no crackles or wheezes. Heart:  Normal rate and regular rhythm. S1 and S2 normal without gallop, murmur, click, rub or other extra sounds. Abdomen:  Bowel sounds positive,abdomen soft and non-tender without masses, organomegaly or hernias noted. Rectal:  No external abnormalities  noted. Normal sphincter tone. No rectal masses or tenderness. Genitalia:  Testes bilaterally descended without nodularity, tenderness or masses. No scrotal masses or lesions. No penis lesions or urethral discharge. Prostate:  Prostate gland firm and smooth, no enlargement, nodularity, tenderness, mass, asymmetry or induration. Msk:  Lumbar-sacral spine is tender to palpation over paraspinal muscles and painfull with the ROM Stiff knees   Pulses:  R and L  carotid,radial,femoral,dorsalis pedis and posterior tibial pulses are full and equal bilaterally Extremities:  No clubbing, cyanosis, edema, or deformity noted with normal full range of motion of all joints.   Neurologic:  alert & oriented X3 and gait normal.   Skin:  Intact without suspicious lesions or rashes Cervical Nodes:  No lymphadenopathy noted Inguinal Nodes:  No significant adenopathy Psych:  Cognition and judgment appear intact. Alert and cooperative with normal attention span and concentration. No apparent delusions, illusions, hallucinations   Impression & Recommendations:  Problem # 1:  HEALTH MAINTENANCE EXAM (ICD-V70.0) Assessment New  Health and age related issues were discussed. Available screening tests and vaccinations were discussed as well. Healthy life style including good diet and exercise was discussed. Labs ordered.  Orders: TLB-BMP (Basic Metabolic Panel-BMET) (80048-METABOL) TLB-CBC Platelet - w/Differential (85025-CBCD) TLB-Hepatic/Liver Function Pnl (80076-HEPATIC) TLB-Lipid Panel (80061-LIPID) TLB-PSA (Prostate Specific Antigen) (84153-PSA) TLB-TSH (Thyroid Stimulating Hormone) (84443-TSH) TLB-Udip ONLY (81003-UDIP) TLB-B12, Serum-Total ONLY (82607-B12) TLB-A1C / Hgb A1C (Glycohemoglobin) (83036-A1C)  Problem # 2:  DIABETES MELLITUS, TYPE II (ICD-250.00) Assessment: Unchanged  His updated medication list for this problem includes:    Glimepiride 2 Mg Tabs (Glimepiride) .Marland Kitchen... 1 by mouth two times a day    Lotensin 40 Mg Tabs (Benazepril hcl) .Marland Kitchen... 1 by mouth daily    Aspir-low 81 Mg Tbec (Aspirin) .Marland Kitchen... 1 once daily    Glimepiride 2 Mg Tabs (Glimepiride) .Marland Kitchen... Take 1 two times a day  Orders: TLB-B12, Serum-Total ONLY (82607-B12) TLB-A1C / Hgb A1C (Glycohemoglobin) (83036-A1C)  Complete Medication List: 1)  Furosemide 40 Mg Tabs (Furosemide) .... Once daily 2)  Klor-con M20 20 Meq Tbcr (Potassium chloride crys cr) .... Take 1 tab by mouth  daily 3)  Glimepiride 2 Mg Tabs (Glimepiride) .Marland Kitchen.. 1 by mouth two times a day 4)  Etodolac 400 Mg Tabs (Etodolac) .Marland Kitchen.. 1 by mouth two times a day pc 5)  Vitamin D3 1000 Unit Tabs (Cholecalciferol) .Marland Kitchen.. 1 by mouth daily 6)  Vitamin B-12 100 Mcg Tabs (Cyanocobalamin) .... Once daily 7)  Amlodipine Besylate 10 Mg Tabs (Amlodipine besylate) .Marland Kitchen.. 1 by mouth daily 8)  Lotensin 40 Mg Tabs (Benazepril hcl) .Marland Kitchen.. 1 by mouth daily 9)  Triamcinolone Acetonide 0.1 % Oint (Triamcinolone acetonide) .... Use 1-2 times a day as needed 10)  Cyclobenzaprine Hcl 10 Mg Tabs (Cyclobenzaprine hcl) .... 1/2-1 tab by mouth two times a day as needed muscle spasms 11)  Aspir-low 81 Mg Tbec (Aspirin) .Marland Kitchen.. 1 once daily 12)  Glimepiride 2 Mg Tabs (Glimepiride) .... Take 1 two times a day  Other Orders: Influenza Vaccine MCR (13086)  Patient Instructions: 1)  Please schedule a follow-up appointment in 4 months. 2)  BMP prior to visit, ICD-9: 3)  HbgA1C prior to visit, ICD-9: 4)  Urine Microalbumin prior to visit, ICD-9:250.00   Orders Added: 1)  Influenza Vaccine MCR [00025] 2)  Est. Patient 40-64 years [99396] 3)  TLB-BMP (Basic Metabolic Panel-BMET) [80048-METABOL] 4)  TLB-CBC Platelet - w/Differential [85025-CBCD] 5)  TLB-Hepatic/Liver Function Pnl [80076-HEPATIC] 6)  TLB-Lipid Panel [80061-LIPID] 7)  TLB-PSA (Prostate Specific Antigen) [  84153-PSA] 8)  TLB-TSH (Thyroid Stimulating Hormone) [84443-TSH] 9)  TLB-Udip ONLY [81003-UDIP] 10)  TLB-B12, Serum-Total ONLY [82607-B12] 11)  TLB-A1C / Hgb A1C (Glycohemoglobin) [83036-A1C]   Immunizations Administered:  Influenza Vaccine # 1:    Vaccine Type: Fluvax MCR    Site: left deltoid    Mfr: Sanofi Pasteur    Dose: 0.5 ml    Route: IM    Given by: Lanier Prude, CMA(AAMA)    Exp. Date: 04/08/2011    Lot #: [id number]    VIS given: 05/03/10 version given September 20, 2010.   Immunizations Administered:  Influenza Vaccine # 1:    Vaccine Type: Fluvax  MCR    Site: left deltoid    Mfr: Sanofi Pasteur    Dose: 0.5 ml    Route: IM    Given by: Lanier Prude, CMA(AAMA)    Exp. Date: 04/08/2011    Lot #: [id number]    VIS given: 05/03/10 version given September 20, 2010.

## 2011-01-10 ENCOUNTER — Other Ambulatory Visit: Payer: Self-pay | Admitting: Internal Medicine

## 2011-01-16 ENCOUNTER — Other Ambulatory Visit: Payer: Self-pay

## 2011-01-16 ENCOUNTER — Other Ambulatory Visit: Payer: Self-pay | Admitting: Internal Medicine

## 2011-01-16 DIAGNOSIS — E119 Type 2 diabetes mellitus without complications: Secondary | ICD-10-CM

## 2011-01-23 ENCOUNTER — Encounter: Payer: Self-pay | Admitting: Internal Medicine

## 2011-01-23 ENCOUNTER — Ambulatory Visit (INDEPENDENT_AMBULATORY_CARE_PROVIDER_SITE_OTHER): Payer: Medicare Other | Admitting: Internal Medicine

## 2011-01-23 ENCOUNTER — Telehealth: Payer: Self-pay | Admitting: Internal Medicine

## 2011-01-23 ENCOUNTER — Other Ambulatory Visit (INDEPENDENT_AMBULATORY_CARE_PROVIDER_SITE_OTHER): Payer: Medicare Other

## 2011-01-23 ENCOUNTER — Other Ambulatory Visit (INDEPENDENT_AMBULATORY_CARE_PROVIDER_SITE_OTHER): Payer: Medicare Other | Admitting: Internal Medicine

## 2011-01-23 DIAGNOSIS — I10 Essential (primary) hypertension: Secondary | ICD-10-CM

## 2011-01-23 DIAGNOSIS — E785 Hyperlipidemia, unspecified: Secondary | ICD-10-CM

## 2011-01-23 DIAGNOSIS — F1020 Alcohol dependence, uncomplicated: Secondary | ICD-10-CM

## 2011-01-23 DIAGNOSIS — E119 Type 2 diabetes mellitus without complications: Secondary | ICD-10-CM

## 2011-01-23 DIAGNOSIS — E538 Deficiency of other specified B group vitamins: Secondary | ICD-10-CM

## 2011-01-23 DIAGNOSIS — R635 Abnormal weight gain: Secondary | ICD-10-CM

## 2011-01-23 LAB — VITAMIN B12: Vitamin B-12: 563 pg/mL (ref 211–911)

## 2011-01-23 LAB — COMPREHENSIVE METABOLIC PANEL
ALT: 10 U/L (ref 0–53)
AST: 16 U/L (ref 0–37)
CO2: 25 mEq/L (ref 19–32)
Chloride: 104 mEq/L (ref 96–112)
Creatinine, Ser: 1.4 mg/dL (ref 0.4–1.5)
GFR: 65.64 mL/min (ref >60.00)
Sodium: 137 mEq/L (ref 135–145)
Total Bilirubin: 0.5 mg/dL (ref 0.3–1.2)
Total Protein: 6.9 g/dL (ref 6.0–8.3)

## 2011-01-23 LAB — MICROALBUMIN / CREATININE URINE RATIO
Creatinine,U: 81.2 mg/dL
Microalb Creat Ratio: 0.4 mg/g (ref 0.0–30.0)
Microalb, Ur: 0.3 mg/dL (ref 0.0–1.9)

## 2011-01-23 LAB — TSH: TSH: 1.96 u[IU]/mL (ref 0.35–5.50)

## 2011-01-23 LAB — LIPID PANEL
Cholesterol: 154 mg/dL (ref 0–200)
Total CHOL/HDL Ratio: 6
VLDL: 54 mg/dL — ABNORMAL HIGH (ref 0.0–40.0)

## 2011-01-23 NOTE — Assessment & Plan Note (Signed)
BP Readings from Last 3 Encounters:  01/23/11 118/76  09/20/10 144/78  05/30/10 145/80

## 2011-01-23 NOTE — Progress Notes (Signed)
  Subjective:    Patient ID: Brian Mccullough, male    DOB: 10-05-1951, 60 y.o.   MRN: [id number]  HPI The patient presents for a follow-up of  chronic hypertension, chronic dyslipidemia, type 2 diabetes controlled with medicines     Review of Systems  Constitutional: Negative for chills and appetite change.  HENT: Positive for congestion (mild allergies).   Eyes: Negative for itching.  Respiratory: Negative for cough.   Cardiovascular: Negative for chest pain.  Gastrointestinal: Negative for abdominal distention.  Genitourinary: Negative for hematuria.  Neurological: Positive for numbness (hands B sometimes).  Psychiatric/Behavioral: Negative for behavioral problems.   Wt Readings from Last 3 Encounters:  01/23/11 362 lb (164.202 kg)  09/20/10 365 lb (165.563 kg)  05/30/10 369 lb (167.377 kg)       Objective:   Physical Exam  Vitals reviewed. Constitutional: He is oriented to person, place, and time. He appears well-developed.  HENT:  Mouth/Throat: Oropharynx is clear and moist.  Eyes: Conjunctivae are normal. Pupils are equal, round, and reactive to light.  Neck: Normal range of motion. No JVD present. No thyromegaly present.  Cardiovascular: Normal rate, regular rhythm, normal heart sounds and intact distal pulses.  Exam reveals no gallop and no friction rub.   No murmur heard. Pulmonary/Chest: Effort normal and breath sounds normal. No respiratory distress. He has no wheezes. He has no rales. He exhibits no tenderness.  Abdominal: Soft. Bowel sounds are normal. He exhibits no distension and no mass. There is no tenderness. There is no rebound and no guarding.  Musculoskeletal: Normal range of motion. He exhibits tenderness (LS is tender with ROM). He exhibits no edema.  Lymphadenopathy:    He has no cervical adenopathy.  Neurological: He is alert and oriented to person, place, and time. He has normal reflexes. No cranial nerve deficit. He exhibits normal muscle tone.  Coordination normal.  Skin: Skin is warm and dry. No rash noted.  Psychiatric: He has a normal mood and affect. His behavior is normal. Judgment and thought content normal.          Assessment & Plan:  B12 DEFICIENCY On po B12  HYPERTENSION BP Readings from Last 3 Encounters:  01/23/11 118/76  09/20/10 144/78  05/30/10 145/80     DYSLIPIDEMIA Will check today  WEIGHT GAIN, ABNORMAL Better. See above  ALCOHOLISM Staying dry

## 2011-01-23 NOTE — Assessment & Plan Note (Signed)
On po B12 

## 2011-01-23 NOTE — Assessment & Plan Note (Signed)
Staying dry °

## 2011-01-23 NOTE — Assessment & Plan Note (Signed)
Better. See above

## 2011-01-23 NOTE — Assessment & Plan Note (Signed)
Will check today

## 2011-01-23 NOTE — Telephone Encounter (Signed)
Stacey , please, inform the patient:  the labs are ok Please, keep  next office visit appointment.   Thank you !   

## 2011-01-24 NOTE — Telephone Encounter (Signed)
Left detailed mess informing of below.  

## 2011-02-09 ENCOUNTER — Other Ambulatory Visit: Payer: Self-pay | Admitting: Internal Medicine

## 2011-02-21 NOTE — H&P (Signed)
Brian Mccullough, Brian Mccullough              ACCOUNT NO.:  [id number]   MEDICAL RECORD NO.:  [id number]          PATIENT TYPE:  INP   LOCATION:  0108                         FACILITY:  [facility]   PHYSICIAN:  Georgina Quint. [name], MDDATE OF BIRTH:  [birth year]   DATE OF ADMISSION:  05/31/2007  DATE OF DISCHARGE:                              HISTORY & PHYSICAL   CHIEF COMPLAIN:  Diarrhea, weakness, sweating.   HISTORY OF PRESENT ILLNESS:  The patient 60 year old male who presents  with complaint of getting sick past Sunday with chills, fever, later the  diarrhea started 6-7 times a day, watery.  He had been having abdominal  cramps, no appetite, drinking and eating very little.  He has been  getting progressively weak with profuse sweats, shivering,  lightheadedness.  Presented to the office after he drove from home.   PAST MEDICAL HISTORY:  1. B12 deficiency.  2. Diabetes.  3. Hypertension.  4. Obesity.  5. Hypogonadism.  6. Sleep apnea.   ALLERGIES:  None   CURRENT MEDICATIONS:  1. She should be taking furosemide 40 mg one to two daily.  2. K-Dur 20 mEq daily.  3. Baby aspirin 81 mg daily.  4. Amaryl 1 mg daily.  5. Clomid 50 mg one fourth tablet daily.  6. AndroGel 1% 10 grams daily.  7. Glucophage 1000 b.i.d.  8. B12 500 mg daily.  9. Lotrel 10/40 daily   SOCIAL HISTORY:  He does not smoke now.  He has been drinking one and a  half gallon of vodka per week, none over past week.   FAMILY HISTORY:  Both parents are living.  Grandparents had some kind of  cancer.  No diabetes in the family.   REVIEW OF SYSTEMS:  No chest pain or shortness of breath.  Feeling quite  sick now.  No vomiting.  The rest is as above or negative.   PHYSICAL EXAMINATION:  VITAL SIGNS:  Blood pressure 84/64, pulse 117,  temperature 100.4, weight 360 pounds.  She is in mild to moderate acute  distress.  Appears pale, clammy skin, sweating profusely.  Shivering.  HEENT: With dry oral mucosa.  Tremor in the  extended extremities.  LUNGS:  Clear.  No wheezes.  HEART:  S1, S2, tachycardia.  ABDOMEN:  Obese, soft, sensitive throughout, nontender.  EXTREMITIES:  Lower extremities with trace edema.  Calves nontender.  He  is alert and oriented, cooperative.   LABORATORY DATA:  None available at this time.   PLAN:  1. Acute gastroenteritis, likely viral, his wife will come and take      him to the ER for direct admit.  Will place on telemetry.  2. Hypertension due to dehydration, rule out other causes including      infection.  Will hydrate aggressively with normal saline.  3. Delirium tremens.  Will start on lorazepam protocol, thiamine.  4. Diabetes.  Will control with sliding scale.  5. Dehydration.  Will replace with IV fluids.  6. Vitamin B12 deficiency.  Will continue oral.      Georgina Quint. [name], MD  Electronically Signed  AVP/MEDQ  D:  05/31/2007  T:  06/02/2007  Job:  [id number]

## 2011-02-21 NOTE — Discharge Summary (Signed)
Brian Mccullough, Brian Mccullough              ACCOUNT NO.:  [id number]   MEDICAL RECORD NO.:  [id number]          PATIENT TYPE:  INP   LOCATION:  1420                         FACILITY:  [facility]   PHYSICIAN:  Georgina Quint. [name], MDDATE OF BIRTH:  02/22/1951   DATE OF ADMISSION:  05/31/2007  DATE OF DISCHARGE:  06/02/2007                               DISCHARGE SUMMARY   FINAL DIAGNOSES:  1. Acute gastroenteritis, likely infectious.  2. Dehydration.  3. Delirium tremens.  4. Hypertensive episode, resolved.  5. Hypokalemia, corrected.  6. Elevated CK-MB probably due to general illness.  Cardiology workup      next week.  7. Acute renal failure, improved with intravenous hydration.  8. Type 2 diabetes.   HISTORY:  The patient is a 60 year old male, who presented to the office  on May 31, 2007, with the complaint of diarrhea, no appetite, perfuse  sweating, he had a fever of 100.4, a little bit diaphoretic and pale.  His blood pressure was 84/64.  Several days prior, he had traveled to  [place] and ate at the [employer] with his son and had  developed acute diarrhea.  His son has had diarrhea as well.  He also  stopped drinking, which aggravated the problem.  Further details are  addressed in the History and Physical dated May 31, 2007.   HOSPITAL COURSE:  During the course of hospitalization, he received IV  fluids, oral Cipro, and symptomatic medicines.   PROBLEM LIST:  1. Diarrhea, likely infectious:  He was treated with Cipro p.o. and      Lomotil.  It has improved, but not completely resolved.  He wants      to go home today and I will let him go.  He will come back if      problems.  Cultures, C.diff, ova and parasites so far negative.      Diet discussed.  2. Hypotension, dehydration, hypokalemia:  Corrected.  3. Delirium tremens:  He is much better now.  He will finish a      Lorazepam taper protocol. He will be on Thiamine.  He is planning      to remain sober.  4.  Hypokalemia:  He will continue with potassium orally.  5. Elevated CK-MB:  His EKG was normal.  He has been asymptomatic from      a cardiac standpoint.  Will obtain a Cardiolite stress test as an      outpatient.  6. Acute renal failure due to the above-illness:  Plan as above.  7. Type 2 diabetes:  Will resume meds.  8. Hypertension:  Will resume meds if blood pressure goes up.  I will      see him back in one week.   LABORATORY DATA:  EKG x2 normal.  Stool culture negative.  C.diff -  negative.  Ova and parasites negative.  Urine test was 3 to 6 WBCs.  Sodium 133 on admission, potassium 2.8, creatinine 3.83, AST of 65,  white count 6.7, hemoglobin 16.8, myoglobin greater than 500, CK-MB at  7.5, troponin less than 0.05.  On August 22nd,  his CK total 908, CK-MB  of 9.6, Vitamin B12 greater than 2000, folate 17.2 normal.   DISCHARGE MEDICATIONS:  1. Cipro 500 mg 1 twice a day for 3 days.  2. Vitamin B12 1000 mcg daily.  3. Thiamine 100 mg daily.  4. Lomotil 2 tablets 4 times daily as needed.  5. Amaril 1 mg daily.  6. Glucophage 100 mg twice a day, hold if diarrhea.  7. Aspirin 81 mg daily.  8. K-Dur 20 mEq twice a day for 1 month.  9. Lotrel 10/40 1 daily, hold if blood pressure less than 120/80.  10.Lorazepam 1 mg as directed.   FOLLOWUP:  Dr. Posey Rea in one week with lab work.  Will obtain a  stress test next week.   SPECIAL INSTRUCTIONS:  Call if problems, return two weeks after you see  me in the followup.      Georgina Quint. [name], MD  Electronically Signed     AVP/MEDQ  D:  06/02/2007  T:  06/02/2007  Job:  [id number]

## 2011-02-21 NOTE — H&P (Signed)
NAMEDAMONIE, ELLENWOOD               ACCOUNT NO.:  [id number]   MEDICAL RECORD NO.:  [id number]          PATIENT TYPE:  INP   LOCATION:  0506                          [facility]ACILITY:  [facility]   PHYSICIAN:  Geoffery Lyons, M.D.      DATE OF BIRTH:  1951/08/01   DATE OF ADMISSION:  03/02/2008  DATE OF DISCHARGE:                       PSYCHIATRIC ADMISSION ASSESSMENT   TIME OF ASSESSMENT:  9:15 a.m.   IDENTIFYING INFORMATION:  A 60 year old married African American male.  This is a voluntary admission.   HISTORY OF PRESENT ILLNESS:  First [facility] admission for this 60 year old,  who says, I fell in love with drinking.  He is requesting help  detoxing from alcohol.  He says that for the past 2 years his alcohol  use has escalated so that at this point he is drinking a fifth a day and  drinking all day every day, sometimes waking up a little bit anxious and  sweaty.  Sleep is disrupted.  Recognizes that he has a problem with  alcohol and wants to be off of it.  He says that he was an occasional  drinker up until around 2003,  when he stopped working.  He was having  problems with pain in his knees and had a total knee replacement at that  time.  Also reports that around that time since he had stopped working  he feels that he may have had a problem with excess idle time on his  hands, having some boredom, and his drinking gradually began to  increase.  Denies any legal problems.  Denies any specific conflict at  home.  Denies any suicidal thoughts.  Recognizes that his alcohol use is  unhealthy.  His wife is supportive of him stopping, and he recognizes  that he needs help getting off the alcohol.   PAST PSYCHIATRIC HISTORY:  First inpatient psychiatric admission.  No  prior history of admissions for depression or other mental illness.  No  prior treatment for substance abuse.  Has never taken psychotropics.  No  history of brain injury, blackouts or amnestic episodes or seizure.   SOCIAL HISTORY:   Married 30 years.  Has 3 children, ages 89, 104 and 75.  Relationship with his wife is good.  He is not working, but his wife is.  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  No history of DWIs or  legal problems.  Denies other substance abuse.   FAMILY HISTORY:  Remarkable for alcoholism on both his mother and  father's side of the family.   ALCOHOL AND DRUG HISTORY:  Noted above.  Denies other substance abuse.   MEDICAL HISTORY:  The patient is followed by Dr. Georgina Quint. [name],  his primary care physician.  Current medical problems are low back pain,  diabetes mellitus type 2, hypertension.  His past medical history is  remarkable for history of hernia repair and total knee replacement.   CURRENT MEDICATIONS:  1. Lorazepam 1 mg as needed prescribed by his primary care physician      to help him get off the alcohol, has been on this  on a short-term      basis.  2. Klor-Con 20 mEq daily.  3. Lotrel 5/20 mg 2 tablets daily.  4. Vitamin B 1 tablet daily.  5. Amaryl 2 mg p.o. b.i.d.  6. Glucophage 1000 mg p.o. b.i.d.  7. Skelaxin 800 mg b.i.d. p.r.n. for back spasms.  8. Lasix 40 mg daily p.r.n. for edema.   DRUG ALLERGIES:  CODEINE.   PHYSICAL EXAMINATION:  Physical exam was done in the emergency room as  noted in the record.  This is a pleasant, large-built African American  male in no distress, 6 feet, 3 inches tall, 376 pounds, temperature 98,  pulse 109, respirations 20 and blood pressure 156/85.   Diagnostic studies were done in the emergency room.  Urine drug screen  was positive for benzodiazepines.  CBC:  WBC 8.3, hemoglobin 15.7,  hematocrit 45.5, platelets 222,000, MCV 97.2.  Chemistry:  Sodium 140,  potassium 4.1, chloride 106, carbon dioxide 19, BUN 9, creatinine 1.25,  and random glucose 154.  His alcohol level was 42.   MENTAL STATUS EXAM:  Fully alert, pleasant, cooperative, appropriate, in  full contact with reality.  Immediate, recent, remote memory are  intact.  Oriented to person, place, situation and circumstances and time.  No  signs of delirium.  Cognition is fully preserved.  Speech is normal.  Mood is neutral.  Thought processes logical and coherent.  He is anxious  to become abstinent from the alcohol.  He had an experience several  years ago of quitting smoking when he was smoking 3 packs per day and  has maintained abstinence since that time, is willing to work with any  followup programs but feels that his will to quit is his most important  factor, very determined to be abstinent, feels that it will be better  for his health, wants to see his children and grandchildren grow up.  Denying any dangerous thoughts.   AXIS I:  Alcohol abuse and dependence.  AXIS II:  Deferred.  AXIS III:  Diabetes mellitus type 2, hypertension, low back pain.  AXIS IV:  Moderate issues with some excess idle time.  Having supportive  family and spouse is an asset to him.  AXIS V:  Current 46, past year 18.   Estimated plan is to voluntarily admit the patient with q. 15-minute  checks in place to our dual diagnosis program with a goal of a safe  detox in 4 days.  We have started him on a Librium protocol and he will  receive tapering doses of Librium 25 mg q.i.d. today and tapering down  along with thiamine, multivitamin and additional medications to manage  any withdrawal symptoms.  His initial CIWA score was taken and will be  monitored every 6 hours while awake.  Estimated length of stay is 4  days.      Margaret A. Scott, N.P.      Geoffery Lyons, M.D.  Electronically Signed    MAS/MEDQ  D:  03/03/2008  T:  03/03/2008  Job:  [id number]

## 2011-02-21 NOTE — Assessment & Plan Note (Signed)
[facility]                            CARDIOLOGY OFFICE NOTE   ELDEAN, NANNA                     MRN:          [id number]  DATE:07/01/2007                            DOB:          03-Oct-1951    PRIMARY CARE PHYSICIAN:  Georgina Quint. [name], MD   REASON FOR PRESENTATION:  Evaluate patient with abnormal cardiac markers  in an abnormal EKG.   HISTORY OF PRESENT ILLNESS:  The patient is a 60 year old gentleman  without prior cardiac history. He was hospitalized late last month for  gastroenteritis. He had salmonella. He also had alcohol withdrawal at  that time. He was noted to have an elevated CK-MB in the absence of any  acute EKG changes. His ratio did not meet criteria for a non-Q-wave  myocardial infarction. His troponin's were negative. However, because of  this and cardiovascular risk factors, he was referred for further  evaluation.   The patient says he is not particularly active. He does do his  activities of daily living and occasionally pushes a lawnmower. He says  with this, there may be some moderate dyspnea with exertion, but not  anything that is new. This has not been progressive. He does not have  any resting shortness of breath, PND or orthopnea. He does not describe  chest discomfort, neck or arm discomfort. He says his heart rate does go  up sometimes. He is not noticing any palpitations, pre-syncope or  syncope. Again, he has a fairly low functional level.   PAST MEDICAL HISTORY:  1. Hypertension x20 years.  2. Diabetes mellitus x2 years.  3. ETOH abuse.   PAST SURGICAL HISTORY:  1. Total knee replacement in 2005.  2. Inguinal hernia repair.  3. Tonsillectomy.   ALLERGIES:  CODEINE.   CURRENT MEDICATIONS:  1. Lotrel 10/40 daily.  2. Glimepiride 1 mg daily.  3. Lasix 40 mg daily.  4. Metformin 1000 mg daily.  5. Klor-Con 20 mEq b.i.d.  6. B12.  7. Cyclobenzaprine 5 mg daily.  8. Lorazepam.  9. B1.  10.Aspirin.  11.Etodolac 400 mg daily.   SOCIAL HISTORY:  The patient is married. He has three children. He is  retired. He was a three pack per day smoker for 20 years, but quit in  2004. He does drink alcohol routinely and thinks he probably is an  alcoholic.   FAMILY HISTORY:  Noncontributory for early coronary artery disease.   REVIEW OF SYSTEMS:  As stated in the HPI and otherwise positive for dry  throat, joint pains. Negative for all other systems.   PHYSICAL EXAMINATION:  The patient is in no distress. Blood pressure  120/76, heart rate 115 and regular. He is pleasant. Weight 363 pounds.  Body Mass Index is 45.  HEENT: Eyelids unremarkable. Pupils equal, round, and reactive to light.  Fundi not visualized. Oral mucosa is unremarkable.  NECK: No jugular venous distention at 45 degrees. Carotid upstroke brisk  and symmetrical. No bruits, no thyromegaly.  LYMPHATICS: No cervical, axillary or inguinal adenopathy.  LUNGS: Clear to auscultation bilaterally.  BACK: No costovertebral angle tenderness.  CHEST: Unremarkable.  HEART: PMI not displaced or sustained. S1, S2 within normal limits. No  S3. No S4. No clicks, rub or murmurs.  ABDOMEN: Obese, positive bowel sounds, normal in frequency and pitch. No  bruits. No rebounds. No guarding. No midline pulsatile mass. No  hepatomegaly, splenomegaly.  SKIN: No rashes, no nodules.  EXTREMITIES: 2+ pulses throughout. No edema, cyanosis or clubbing.  NEURO: Oriented to person, place and time. Cranial nerves II-XII grossly  intact. Motor grossly intact throughout.   EKG: Sinus tachycardia, rate 115, left axis deviation. Left anterior  fascicular block, no acute ST-T wave changes.   ASSESSMENT/PLAN:  1. Abnormal cardiac enzymes. The patient had non-diagnostic cardiac      enzymes. He does have significant cardiovascular risk factors.      Also, concerned about his heart rate. He has dyspnea which could be      an anginal equivalent.  Given all this, the pre-test probability of      obstructive coronary disease is moderately high. Therefore, he will      need evaluation with a stress perfusion study.  2. Tachycardia. This will be evaluated in context of looking for      coronary disease. I also will be assessing his LV function with the      Cardiolite. I will get a BNP. If there is any suggestion of an      elevated BNP or low EF on Cardiolite, he will need an      echocardiogram. I am going to check a TSH. Risk reduction because      of diabetes, he might benefit from a statin. I will check a lipid      profile.  3. Obesity. He understands the need to lose weight with diet and      exercise and I will encourage this over time.  4. Alcohol. He understands that this is probably his most important      health concern. He is      going to look into getting help with this.  5. Followup. I would like to see him back based on the results of the      above.     Rollene Rotunda, MD, [facility]  Electronically Signed    JH/MedQ  DD: 07/01/2007  DT: 07/01/2007  Job #: [id number]   cc:   Georgina Quint. [name], MD

## 2011-02-24 NOTE — H&P (Signed)
NAME:  Brian Mccullough, Brian Mccullough                        ACCOUNT NO.:  [id number]   MEDICAL RECORD NO.:  [id number]                   PATIENT TYPE:  INP   LOCATION:  2550                                 FACILITY:  [facility]   PHYSICIAN:  Myrtie Neither, M.D.                 DATE OF BIRTH:  01/19/1951   DATE OF ADMISSION:  01/14/2004  DATE OF DISCHARGE:                                HISTORY & PHYSICAL   CHIEF COMPLAINT:  Painful left knee.   HISTORY OF PRESENT ILLNESS:  This is a 60 year old male who has been  followed in the office for degenerative joint disease, severe, in the left  knee with persisting pain both at rest as well as on ambulation.  The  patient is unable to do any particular exercise due to severe pain in the  left knee.  The patient is getting around with the use of a cane.   PAST MEDICAL HISTORY:  High blood pressure, previous arthroscopy of the left  knee, hernia repair, tonsillectomy and adenoidectomy.   SOCIAL HISTORY:  The patient lives with his wife and his two sons.  Occasionally uses alcohol.  Denies use of tobacco or illegal substances.   FAMILY HISTORY:  Noncontributory.   REVIEW OF SYMPTOMS:  Basically as in history of present illness.  No  cardiac, respiratory, urinary or bowel symptoms.   ALLERGIES:  None known.   MEDICATIONS:  1. Lotrel 10/10 one tablet daily  2. Advil PRN  3. Lodine 400 mg b.i.d.   PHYSICAL EXAMINATION:  GENERAL:  Patient is alert and oriented, in no acute  distress.  VITAL SIGNS:  Temperature 96, pulse 90, respirations 18, blood pressure  154/83, height 6 feet, 3 inches, weight 348 pounds.  HEENT: Head is normocephalic.  Eyes with conjunctiva and sclerae clear.  NECK:  Supple.  CHEST:  Clear.  CARDIAC:  S1 and S2 regular.  EXTREMITIES:  Left knee has limited range of motion, tender in medial and  lateral compartments with crepitus in both medial and lateral compartments  as well as patellofemoral joint.  Negative Homans' test.   Negative drawer's  test.  Pulses intact.   CLINICAL DATA:  X-ray in the office revealed severe degenerative joint  changes both medial and lateral compartments and patellofemoral joint.   IMPRESSION:  1. Severe degenerative joint disease left knee.  2. High blood pressure.   PLAN:  Left total knee arthroplasty.                                                Myrtie Neither, M.D.    AC/MEDQ  D:  01/14/2004  T:  01/14/2004  Job:  [id number]

## 2011-02-24 NOTE — Discharge Summary (Signed)
NAME:  Brian Mccullough, Brian Mccullough                        ACCOUNT NO.:  [id number]   MEDICAL RECORD NO.:  [id number]                   PATIENT TYPE:  INP   LOCATION:  5039                                 FACILITY:  [facility]   PHYSICIAN:  Myrtie Neither, M.D.                 DATE OF BIRTH:  September 17, 1951   DATE OF ADMISSION:  01/14/2004  DATE OF DISCHARGE:  01/18/2004                                 DISCHARGE SUMMARY   ADMISSION DIAGNOSIS:  Degenerative arthritis, left knee.   DISCHARGE DIAGNOSIS:  Degenerative arthritis, left knee.   COMPLICATIONS:  None.   INFECTIONS:  None.   OPERATION:  Left total knee arthroplasty done on January 14, 2004.   PERTINENT HISTORY:  This is a 60 year old male who has been followed in the  office for degenerative joint disease involving his left knee which  progressively worsened over the past year. Has been treated with therapeutic  injections and anti-inflammatories.   PERTINENT PHYSICAL EXAM OF THE LEFT KNEE:  Left knee range of motion is  limited. Tender medial and lateral compartment with crepitus both medial and  lateral compartment and patellofemoral joint. Negative Homan's test.  Negative drawers test. Pulses intact. X-rays reveal severe degenerative  joint changes with loss of joint space, both medial and lateral compartment  and patellofemoral joint.   HOSPITAL COURSE:  The patient underwent preop medical exam by his primary  care doctor, Dr. Shana Chute, found to be stable to undergo surgery. The patient  had preop laboratory, CMET, EKG, chest x-ray, UA, PT, PTT. The patient's  laboratory was found to be stable enough for the patient to undergo surgery.   HOSPITAL COURSE:  The patient underwent left total knee arthroplasty,  tolerated the procedure quite well. Postoperative course was fairly benign.  Use of CPM machine, physical therapy, OT therapy. The patient was placed on  prophylactic Coumadin and received pre and postoperative IV antibiotics. The  patient progressed with therapy fairly well and was able to be discharged to  return to the office in one week. The patient was discharged on Percocet 5  mg q.4h. p.r.n. for pain, CPM at home for one week, continue his Coumadin.  The patient was discharged in stable and satisfactory condition.                                                Myrtie Neither, M.D.    AC/MEDQ  D:  02/24/2004  T:  02/26/2004  Job:  [id number]

## 2011-02-24 NOTE — Discharge Summary (Signed)
Brian, Mccullough NO.:  [id number]   MEDICAL RECORD NO.:  [id number]          PATIENT TYPE:  IPS   LOCATION:  0506                          FACILITY:  [facility]   PHYSICIAN:  Geoffery Lyons, M.D.      DATE OF BIRTH:  06-Apr-1951   DATE OF ADMISSION:  03/02/2008  DATE OF DISCHARGE:  03/04/2008                               DISCHARGE SUMMARY   CHIEF COMPLAINT/PRESENT ILLNESS:  This was the first admission to [facility] for this 60 year old married African American  male, who endorsed, I fell in love with drinking.  Requesting help  with detox.  The past 2 years his alcohol use has escalated.  He is  drinking a fifth a day and drinking all day every day, sometimes waking  up a little bit anxious and sweaty.  Disrupted sleep.  He was an  occasional drinker up until around 2003, when he stopped working.  He  was having problems with pain in his knee, had a total knee replacement.  Since he stopped working, endorsed he might have a problem with excess  idle time on his hands.   PAST PSYCHIATRIC HISTORY:  First time inpatient.  No previous treatment.   ALCOHOL AND DRUG HISTORY:  As already stated, escalating use of alcohol.  No other substances.   MEDICAL HISTORY:  Back pain, diabetes mellitus type 2, hypertension,  status post total knee replacement.   MEDICATIONS:  He was on Ativan and lorazepam 1 mg as needed to help him  get off the alcohol, Klor-Con 20 mEq per day, Lotrel 5/20 two tablets  daily, vitamin B1 one daily, Amaryl 2 mg twice daily, Glucophage 1000  twice a day, Skelaxin 100 mg twice a day as needed for back spasm, Lasix  20 mg daily for edema.   PHYSICAL EXAMINATION:  Failed to show any acute findings.   LABORATORY WORK:  White blood cells 8.3, hemoglobin 15.7.  Mean  corpuscular volume 97.2.  Sodium 140, potassium 4.1, BUN 9, creatinine  1.25, glucose 154.   MENTAL STATUS EXAM:  Reveals a fully alert, cooperative male.  Mood  anxious.  Affect anxious.  Thought processes logical, coherent and  relevant.  Endorsed that he was to get detoxed, trying not to go back to  drinking.  He was smoking up to 3 packs of cigarettes per day.  No  delusions.  No homicidal ideas.  No hallucinations.  Cognition well-  preserved.   ADMITTING DIAGNOSIS:  AXIS I:  Alcohol dependence.  AXIS II:  No diagnosis.  AXIS III:  Diabetes mellitus type 2.  Hypertension.  Low back pain.  Status post total hip replacement.  AXIS IV:  Moderate.  AXIS V:  On admission 40, highest GAF in the last year 70.   COURSE IN THE HOSPITAL:  Was admitted, started in individual and group  psychotherapy.  He was detoxified with Librium.  He was maintained on  his other medications.  As already stated, he endorsed he fell in love  with alcohol.  It became a more important part of his life once he  stopped working due mostly to arthritis and knee replacement.  Endorsed  that he was doing more so out of boredom and that now he was completely  dependent.  The detox went uneventfully.  His mood was euthymic.  His  affect was brighter.  By May 27, he felt that he could be discharged.  He was endorsing commitment to abstinence.  The Librium was holding him.  He was going to be discharged into a stable home environment.  Was  willing to come to the [facility] program, so we went ahead and discharged to  outpatient followup.   DISCHARGE DIAGNOSES:  AXIS I:  Alcohol dependence.  AXIS II:  No diagnosis.  AXIS III:  Diabetes mellitus type 2.  Hypertension.  Low back pain.  Status post knee replacement.  AXIS IV:  Moderate.  AXIS V:  On discharge 50.   Discharged on Campral 333 two 3 times a day, Librium 25, 1 at 5:00 p.m.  May 27, 1 three times a day May 28, one twice a day May 29 and the 30,  one daily May 31 and June 1, K-Dur 20 mEq daily, Lotrel 5/20 one daily,  Lasix 40 mg, vitamin B1, Glucophage 1000 twice a day, Amaryl 2 mg twice  a day and Skelaxin 100 mg  twice a day as needed.  Follow at [facility].      Geoffery Lyons, M.D.  Electronically Signed     IL/MEDQ  D:  03/31/2008  T:  03/31/2008  Job:  [id number]

## 2011-02-24 NOTE — Op Note (Signed)
NAME:  Brian Mccullough, Brian Mccullough                        ACCOUNT NO.:  [id number]   MEDICAL RECORD NO.:  [id number]                   PATIENT TYPE:  INP   LOCATION:  2550                                 FACILITY:  [facility]   PHYSICIAN:  Myrtie Neither, M.D.                 DATE OF BIRTH:  08-11-1951   DATE OF PROCEDURE:  01/14/2004  DATE OF DISCHARGE:                                 OPERATIVE REPORT   PREOPERATIVE DIAGNOSIS:  Degenerative joint disease, left knee.   POSTOPERATIVE DIAGNOSIS:  Degenerative joint disease, left knee.   ANESTHESIA:  General.   PROCEDURE:  Left total knee arthroplasty.   DESCRIPTION OF PROCEDURE:  The patient was taken to the operating room after  being given adequate preoperative medication and general anesthesia is  intubated.  The left knee was then prepped with DuraPrep and draped in a  sterile manner.  Tourniquet and Bovie used for hemostasis.  Anterior midline  incision was made over the left knee followed through skin and subcutaneous  tissue with sharp and blunt dissection both medially and laterally.  Medial  paramedian incision was made into the capsule extending from the quadriceps  down to the patellar tendon.  The patella was reflected laterally.  Osteophytes about the femur, patella and tibia were resected.  Soft tissue  release medially was done due to the varus angulation of his knee.  ACL was  resected. Tibia was brought out anteriorly.  Tibial cutting jig was set at  10 mm and tibial plateau surface was resected.  Next reaming was done down  the femoral canal, distal femoral cutting jig was put in place, set at 6  degrees of valgus.  Distal femoral cutting surface was prepared.  Next  sizing of the femur was 75 with flushing gap of 2 mm.  Reaming down the  femoral canal was done, anterior and posterior and Champion cutting jig was  put in place, 75 mm, anterior and posterior cuts were made as well as  chamfering cuts were made.  All of the  osteophytes were resected off the  femur as well as soft tissue resection.  Trial components of the femur with  very good snug fit, good contact.  Next attention was turned to the tibial  surface which was sized at 83 mm with good coverage.  Appropriate cutting  jig was put in place and appropriate cuts were made.  Trial components for  the tibia and femur were put in place.  The patella was sized as a large  size patella.  Appropriate cutting jig put in place and surface of the  patella was resected.  With all three components in place, range of motion  was found to be full in extension, full in flexion, 16 mm poly looked to be  good and stable.  Copious irrigation was then done with pulse and antibiotic  solution.  Methyl methacrylate was mixed and femoral  component was press-  fit.  Patella and tibia were cemented.  Trial components for the poly then  tried again and the 18 mm was tried and found to be even more stable both in  extension and flexion.  The patella tracked quite well without any  subluxation.  The final poly 18 mm was snapped in place and locked and again  range of motion tested with full extension, full flexion, good medial and  lateral stability, good tracking of the patella.  Tourniquet was let down.  Hemostasis was obtained with Bovie followed by wound closure with 0 Vicryl  to the fascia and capsule, 2-0 for the subcutaneous tissue, skin staples for  the skin.  A compressive dressing was applied and knee immobilizer was  applied.  The patient tolerated the procedure quite well and was sent to the  recovery room in stable and satisfactory condition.  The patient had  epidural attempt that was not successful but he did have femoral block prior  to going to the operating room.  The patient went to the recovery room in  stable and satisfactory condition.                                              Myrtie Neither, M.D.   AC/MEDQ  D:  01/14/2004  T:  01/14/2004  Job:   [id number]

## 2011-03-03 ENCOUNTER — Other Ambulatory Visit: Payer: Self-pay | Admitting: Internal Medicine

## 2011-03-03 NOTE — Telephone Encounter (Signed)
Ok to Rf? Med is not active in centricity 

## 2011-03-28 ENCOUNTER — Other Ambulatory Visit: Payer: Self-pay | Admitting: *Deleted

## 2011-03-28 MED ORDER — CLOTRIMAZOLE-BETAMETHASONE 1-0.05 % EX CREA: TOPICAL_CREAM | Freq: Two times a day (BID) | CUTANEOUS | Status: DC

## 2011-05-29 ENCOUNTER — Other Ambulatory Visit (INDEPENDENT_AMBULATORY_CARE_PROVIDER_SITE_OTHER): Payer: Medicare Other

## 2011-05-29 ENCOUNTER — Ambulatory Visit (INDEPENDENT_AMBULATORY_CARE_PROVIDER_SITE_OTHER): Payer: Medicare Other | Admitting: Internal Medicine

## 2011-05-29 ENCOUNTER — Telehealth: Payer: Self-pay | Admitting: Internal Medicine

## 2011-05-29 ENCOUNTER — Encounter: Payer: Self-pay | Admitting: Internal Medicine

## 2011-05-29 DIAGNOSIS — I10 Essential (primary) hypertension: Secondary | ICD-10-CM

## 2011-05-29 DIAGNOSIS — M545 Low back pain, unspecified: Secondary | ICD-10-CM

## 2011-05-29 DIAGNOSIS — E538 Deficiency of other specified B group vitamins: Secondary | ICD-10-CM

## 2011-05-29 DIAGNOSIS — G4700 Insomnia, unspecified: Secondary | ICD-10-CM | POA: Insufficient documentation

## 2011-05-29 DIAGNOSIS — E669 Obesity, unspecified: Secondary | ICD-10-CM

## 2011-05-29 DIAGNOSIS — E119 Type 2 diabetes mellitus without complications: Secondary | ICD-10-CM

## 2011-05-29 DIAGNOSIS — Z79899 Other long term (current) drug therapy: Secondary | ICD-10-CM

## 2011-05-29 LAB — COMPREHENSIVE METABOLIC PANEL
ALT: 14 U/L (ref 0–53)
CO2: 26 mEq/L (ref 19–32)
Calcium: 9 mg/dL (ref 8.4–10.5)
Chloride: 105 mEq/L (ref 96–112)
GFR: 73.25 mL/min (ref >60.00)
Sodium: 138 mEq/L (ref 135–145)
Total Protein: 7.3 g/dL (ref 6.0–8.3)

## 2011-05-29 LAB — LIPID PANEL
HDL: 29.7 mg/dL — ABNORMAL LOW (ref >39.00)
LDL Cholesterol: 99 mg/dL (ref 0–99)
Total CHOL/HDL Ratio: 5

## 2011-05-29 LAB — VITAMIN B12: Vitamin B-12: 982 pg/mL — ABNORMAL HIGH (ref 211–911)

## 2011-05-29 MED ORDER — NORTRIPTYLINE HCL 25 MG PO CAPS: 25.0000 mg | ORAL_CAPSULE | Freq: Every day | ORAL | Status: DC

## 2011-05-29 MED ORDER — TRAMADOL HCL 50 MG PO TABS: 50.0000 mg | ORAL_TABLET | Freq: Two times a day (BID) | ORAL | Status: DC | PRN

## 2011-05-29 NOTE — Assessment & Plan Note (Signed)
BP Readings from Last 3 Encounters:  05/29/11 120/68  01/23/11 118/76  09/20/10 144/78

## 2011-05-29 NOTE — Assessment & Plan Note (Signed)
Try Nortriptiline

## 2011-05-29 NOTE — Telephone Encounter (Signed)
Stacey , please, inform the patient: labs are OK   Please, keep  next office visit appointment.   Thank you !   

## 2011-05-29 NOTE — Assessment & Plan Note (Signed)
Labs today On Rx

## 2011-05-29 NOTE — Progress Notes (Signed)
  Subjective:    Patient ID: Brian Mccullough, male    DOB: 1951/02/09, 60 y.o.   MRN: [id number]  HPI   The patient presents for a follow-up of  chronic hypertension, chronic dyslipidemia, type 2 diabetes controlled with medicines  C/o insomnia and snoring. Denies OSA.  C/o LBP   Review of Systems  Constitutional: Negative for appetite change, fatigue and unexpected weight change.  HENT: Negative for nosebleeds, congestion, sore throat, sneezing, trouble swallowing and neck pain.   Eyes: Negative for itching and visual disturbance.  Respiratory: Negative for cough.   Cardiovascular: Negative for chest pain, palpitations and leg swelling.  Gastrointestinal: Negative for nausea, diarrhea, blood in stool and abdominal distention.  Genitourinary: Negative for frequency and hematuria.  Musculoskeletal: Positive for back pain. Negative for joint swelling and gait problem.  Skin: Negative for rash.  Neurological: Negative for dizziness, tremors, speech difficulty and weakness.  Psychiatric/Behavioral: Positive for sleep disturbance. Negative for suicidal ideas, dysphoric mood and agitation. The patient is nervous/anxious.     Wt Readings from Last 3 Encounters:  05/29/11 356 lb (161.481 kg)  01/23/11 362 lb (164.202 kg)  09/20/10 365 lb (165.563 kg)       Objective:   Physical Exam  Constitutional: He is oriented to person, place, and time. He appears well-developed.       Obese  HENT:  Mouth/Throat: Oropharynx is clear and moist.  Eyes: Conjunctivae are normal. Pupils are equal, round, and reactive to light.  Neck: Normal range of motion. No JVD present. No thyromegaly present.  Cardiovascular: Normal rate, regular rhythm, normal heart sounds and intact distal pulses.  Exam reveals no gallop and no friction rub.   No murmur heard. Pulmonary/Chest: Effort normal and breath sounds normal. No respiratory distress. He has no wheezes. He has no rales. He exhibits no tenderness.    Abdominal: Soft. Bowel sounds are normal. He exhibits no distension and no mass. There is no tenderness. There is no rebound and no guarding.  Musculoskeletal: Normal range of motion. He exhibits tenderness (LS is tender). He exhibits no edema.  Lymphadenopathy:    He has no cervical adenopathy.  Neurological: He is alert and oriented to person, place, and time. He has normal reflexes. No cranial nerve deficit. He exhibits normal muscle tone. Coordination normal.  Skin: Skin is warm and dry. No rash noted.  Psychiatric: He has a normal mood and affect. His behavior is normal. Judgment and thought content normal.   Str leg elev is neg B R IT band is sensitive       Assessment & Plan:    Stretch hips/IT bands

## 2011-05-29 NOTE — Assessment & Plan Note (Signed)
Lost some wt

## 2011-05-29 NOTE — Assessment & Plan Note (Signed)
On Rx 

## 2011-05-29 NOTE — Assessment & Plan Note (Signed)
Loose wt Tramadol prn

## 2011-05-30 NOTE — Telephone Encounter (Signed)
Pt informed

## 2011-06-07 ENCOUNTER — Other Ambulatory Visit: Payer: Self-pay | Admitting: Internal Medicine

## 2011-06-26 ENCOUNTER — Other Ambulatory Visit: Payer: Self-pay | Admitting: Internal Medicine

## 2011-07-05 LAB — BASIC METABOLIC PANEL
Calcium: 9.3
Creatinine, Ser: 1.25
GFR calc Af Amer: >60
GFR calc non Af Amer: 60 — ABNORMAL LOW
Glucose, Bld: 154 — ABNORMAL HIGH
Sodium: 140

## 2011-07-05 LAB — RAPID URINE DRUG SCREEN, HOSP PERFORMED
Amphetamines: NOT DETECTED
Barbiturates: NOT DETECTED
Benzodiazepines: POSITIVE — AB
Cocaine: NOT DETECTED

## 2011-07-05 LAB — CBC
HCT: 45.5
Hemoglobin: 15.7
MCHC: 34.4
Platelets: 222
RDW: 12
WBC: 8.3

## 2011-07-05 LAB — HEPATIC FUNCTION PANEL
ALT: 29
AST: 34
Alkaline Phosphatase: 48
Bilirubin, Direct: 0.2
Bilirubin, Direct: 0.3
Indirect Bilirubin: 0.9
Indirect Bilirubin: 0.9
Total Bilirubin: 1.1
Total Bilirubin: 1.2

## 2011-07-05 LAB — DIFFERENTIAL
Basophils Absolute: 0
Eosinophils Relative: 2
Lymphocytes Relative: 22
Lymphs Abs: 1.8
Neutro Abs: 5.8

## 2011-07-05 LAB — TSH: TSH: 2.236

## 2011-07-06 ENCOUNTER — Other Ambulatory Visit: Payer: Self-pay | Admitting: Internal Medicine

## 2011-07-06 LAB — URINE DRUGS OF ABUSE SCREEN W ALC, ROUTINE (REF LAB)
Amphetamine Screen, Ur: NEGATIVE
Barbiturate Quant, Ur: NEGATIVE
Barbiturate Quant, Ur: NEGATIVE
Barbiturate Quant, Ur: NEGATIVE
Barbiturate Quant, Ur: NEGATIVE
Barbiturate Quant, Ur: NEGATIVE
Benzodiazepines.: POSITIVE — AB
Benzodiazepines.: POSITIVE — AB
Benzodiazepines.: POSITIVE — AB
Benzodiazepines.: POSITIVE — AB
Cocaine Metabolites: NEGATIVE
Cocaine Metabolites: NEGATIVE
Cocaine Metabolites: NEGATIVE
Cocaine Metabolites: NEGATIVE
Creatinine,U: 170.6
Creatinine,U: 176.9
Creatinine,U: 205.5
Ethyl Alcohol: <10
Marijuana Metabolite: NEGATIVE
Methadone: NEGATIVE
Methadone: NEGATIVE
Opiate Screen, Urine: NEGATIVE
Opiate Screen, Urine: NEGATIVE
Opiate Screen, Urine: NEGATIVE
Phencyclidine (PCP): NEGATIVE
Phencyclidine (PCP): NEGATIVE
Propoxyphene: NEGATIVE

## 2011-07-06 LAB — BENZODIAZEPINE, QUANTITATIVE, URINE
Alprazolam (GC/LC/MS), ur confirm: 540 ng/mL
Alprazolam (GC/LC/MS), ur confirm: NEGATIVE
Alprazolam (GC/LC/MS), ur confirm: NEGATIVE
Flurazepam GC/MS Conf: NEGATIVE
Flurazepam GC/MS Conf: NEGATIVE
Flurazepam GC/MS Conf: NEGATIVE
Flurazepam GC/MS Conf: NEGATIVE
Nordiazepam GC/MS Conf: NEGATIVE
Nordiazepam GC/MS Conf: NEGATIVE
Nordiazepam GC/MS Conf: NEGATIVE
Oxazepam GC/MS Conf: 280 ng/mL

## 2011-07-07 LAB — URINE DRUGS OF ABUSE SCREEN W ALC, ROUTINE (REF LAB)
Barbiturate Quant, Ur: NEGATIVE
Benzodiazepines.: POSITIVE — AB
Cocaine Metabolites: NEGATIVE
Creatinine,U: 148.7
Marijuana Metabolite: NEGATIVE
Methadone: NEGATIVE
Phencyclidine (PCP): NEGATIVE
Phencyclidine (PCP): NEGATIVE

## 2011-07-07 LAB — BENZODIAZEPINE, QUANTITATIVE, URINE: Alprazolam (GC/LC/MS), ur confirm: NEGATIVE

## 2011-07-21 LAB — VITAMIN B12: Vitamin B-12: >2000 — ABNORMAL HIGH (ref 211–911)

## 2011-07-21 LAB — OVA AND PARASITE EXAMINATION: Ova and parasites: NONE SEEN

## 2011-07-21 LAB — POCT CARDIAC MARKERS: Myoglobin, poc: >500

## 2011-07-21 LAB — URINALYSIS, ROUTINE W REFLEX MICROSCOPIC
Glucose, UA: NEGATIVE
pH: 5

## 2011-07-21 LAB — URINE MICROSCOPIC-ADD ON

## 2011-07-21 LAB — CBC
MCHC: 34.9
MCV: 95.1
Platelets: 165
RDW: 13.8

## 2011-07-21 LAB — DIFFERENTIAL
Basophils Relative: 0
Eosinophils Absolute: 0
Eosinophils Relative: 1
Monocytes Relative: 16 — ABNORMAL HIGH
Neutrophils Relative %: 68

## 2011-07-21 LAB — TROPONIN I
Troponin I: 0.03
Troponin I: 0.03

## 2011-07-21 LAB — MAGNESIUM: Magnesium: 1.7

## 2011-07-21 LAB — COMPREHENSIVE METABOLIC PANEL
AST: 65 — ABNORMAL HIGH
Albumin: 3.2 — ABNORMAL LOW
Calcium: 9
Creatinine, Ser: 3.83 — ABNORMAL HIGH
GFR calc Af Amer: 20 — ABNORMAL LOW

## 2011-07-21 LAB — STOOL CULTURE

## 2011-07-21 LAB — BASIC METABOLIC PANEL
CO2: 20
CO2: 21
Chloride: 101
Chloride: 106
Creatinine, Ser: 1.46
GFR calc Af Amer: 33 — ABNORMAL LOW
GFR calc Af Amer: >60
Potassium: 2.9 — ABNORMAL LOW
Sodium: 133 — ABNORMAL LOW
Sodium: 135

## 2011-07-21 LAB — CLOSTRIDIUM DIFFICILE EIA

## 2011-07-21 LAB — CK TOTAL AND CKMB (NOT AT ARMC)
CK, MB: 9.6 — ABNORMAL HIGH
Relative Index: 1.1
Total CK: 876 — ABNORMAL HIGH
Total CK: 908 — ABNORMAL HIGH

## 2011-07-24 ENCOUNTER — Other Ambulatory Visit: Payer: Self-pay | Admitting: Internal Medicine

## 2011-08-07 ENCOUNTER — Other Ambulatory Visit: Payer: Self-pay | Admitting: Internal Medicine

## 2011-09-21 ENCOUNTER — Other Ambulatory Visit: Payer: Medicare Other

## 2011-09-28 ENCOUNTER — Ambulatory Visit (INDEPENDENT_AMBULATORY_CARE_PROVIDER_SITE_OTHER): Payer: Medicare Other | Admitting: Internal Medicine

## 2011-09-28 ENCOUNTER — Encounter: Payer: Self-pay | Admitting: Internal Medicine

## 2011-09-28 ENCOUNTER — Other Ambulatory Visit (INDEPENDENT_AMBULATORY_CARE_PROVIDER_SITE_OTHER): Payer: Medicare Other

## 2011-09-28 VITALS — BP 120/72 | HR 72 | Temp 98.2°F | Resp 16 | Wt 343.0 lb

## 2011-09-28 DIAGNOSIS — F1020 Alcohol dependence, uncomplicated: Secondary | ICD-10-CM

## 2011-09-28 DIAGNOSIS — M545 Low back pain, unspecified: Secondary | ICD-10-CM

## 2011-09-28 DIAGNOSIS — Z136 Encounter for screening for cardiovascular disorders: Secondary | ICD-10-CM

## 2011-09-28 DIAGNOSIS — R002 Palpitations: Secondary | ICD-10-CM

## 2011-09-28 DIAGNOSIS — G4700 Insomnia, unspecified: Secondary | ICD-10-CM

## 2011-09-28 DIAGNOSIS — Z0000 Encounter for general adult medical examination without abnormal findings: Secondary | ICD-10-CM

## 2011-09-28 DIAGNOSIS — Z23 Encounter for immunization: Secondary | ICD-10-CM

## 2011-09-28 DIAGNOSIS — N320 Bladder-neck obstruction: Secondary | ICD-10-CM

## 2011-09-28 DIAGNOSIS — Z79899 Other long term (current) drug therapy: Secondary | ICD-10-CM

## 2011-09-28 DIAGNOSIS — E538 Deficiency of other specified B group vitamins: Secondary | ICD-10-CM

## 2011-09-28 DIAGNOSIS — E119 Type 2 diabetes mellitus without complications: Secondary | ICD-10-CM

## 2011-09-28 LAB — CBC WITH DIFFERENTIAL/PLATELET
Basophils Absolute: 0 10*3/uL (ref 0.0–0.1)
Eosinophils Relative: 3.2 % (ref 0.0–5.0)
HCT: 46.1 % (ref 39.0–52.0)
Hemoglobin: 15.9 g/dL (ref 13.0–17.0)
Lymphocytes Relative: 34.5 % (ref 12.0–46.0)
Monocytes Relative: 10 % (ref 3.0–12.0)
Platelets: 201 10*3/uL (ref 150.0–400.0)
RDW: 13.6 % (ref 11.5–14.6)
WBC: 6.5 10*3/uL (ref 4.5–10.5)

## 2011-09-28 LAB — URINALYSIS
Leukocytes, UA: NEGATIVE
Nitrite: NEGATIVE
Specific Gravity, Urine: 1.02 (ref 1.000–1.030)
Total Protein, Urine: NEGATIVE
pH: 5.5 (ref 5.0–8.0)

## 2011-09-28 LAB — BASIC METABOLIC PANEL
BUN: 10 mg/dL (ref 6–23)
Chloride: 107 mEq/L (ref 96–112)
Creatinine, Ser: 1.2 mg/dL (ref 0.4–1.5)
GFR: 81.09 mL/min (ref >60.00)
Potassium: 4 mEq/L (ref 3.5–5.1)

## 2011-09-28 LAB — HEMOGLOBIN A1C: Hgb A1c MFr Bld: 5.8 % (ref 4.6–6.5)

## 2011-09-28 LAB — VITAMIN B12: Vitamin B-12: 542 pg/mL (ref 211–911)

## 2011-09-28 LAB — TSH: TSH: 1.01 u[IU]/mL (ref 0.35–5.50)

## 2011-09-28 MED ORDER — TIZANIDINE HCL 4 MG PO TABS: 4.0000 mg | ORAL_TABLET | Freq: Three times a day (TID) | ORAL | Status: DC | PRN

## 2011-09-28 NOTE — Progress Notes (Signed)
  Subjective:    Patient ID: Brian Mccullough, male    DOB: [birth year], 60 y.o.   MRN: [id number]  HPI  The patient presents for a follow-up of  chronic hypertension, chronic dyslipidemia, type 2 diabetes controlled with medicines  C/o palpitations episodes - rare - last happened yesturday  Review of Systems  Constitutional: Negative for appetite change, fatigue and unexpected weight change.  HENT: Negative for hearing loss, ear pain, nosebleeds, congestion, sore throat, sneezing, trouble swallowing and neck pain.   Eyes: Negative for itching and visual disturbance.  Respiratory: Negative for cough.   Cardiovascular: Positive for palpitations (rareo once a yer 20-30 sec). Negative for chest pain and leg swelling.  Gastrointestinal: Negative for nausea, vomiting, diarrhea, blood in stool and abdominal distention.  Genitourinary: Negative for frequency and hematuria.  Musculoskeletal: Negative for back pain, joint swelling and gait problem.  Skin: Negative for rash and wound.  Neurological: Negative for dizziness, tremors, speech difficulty, weakness, light-headedness, numbness and headaches.  Psychiatric/Behavioral: Negative for suicidal ideas, sleep disturbance, dysphoric mood and agitation. The patient is not nervous/anxious and is not hyperactive.        Objective:   Physical Exam  Constitutional: He is oriented to person, place, and time. He appears well-developed.       Obese   HENT:  Mouth/Throat: Oropharynx is clear and moist.  Eyes: Conjunctivae are normal. Pupils are equal, round, and reactive to light.  Neck: Normal range of motion. No JVD present. No thyromegaly present.  Cardiovascular: Normal rate, regular rhythm, normal heart sounds and intact distal pulses.  Exam reveals no gallop and no friction rub.   No murmur heard. Pulmonary/Chest: Effort normal and breath sounds normal. No respiratory distress. He has no wheezes. He has no rales. He exhibits no tenderness.    Abdominal: Soft. Bowel sounds are normal. He exhibits no distension and no mass. There is no tenderness. There is no rebound and no guarding.  Musculoskeletal: Normal range of motion. He exhibits no edema and no tenderness.  Lymphadenopathy:    He has no cervical adenopathy.  Neurological: He is alert and oriented to person, place, and time. He has normal reflexes. No cranial nerve deficit. He exhibits normal muscle tone. Coordination normal.  Skin: Skin is warm and dry. No rash noted.  Psychiatric: He has a normal mood and affect. His behavior is normal. Judgment and thought content normal.    Wt Readings from Last 3 Encounters:  09/28/11 343 lb (155.584 kg)  05/29/11 356 lb (161.481 kg)  01/23/11 362 lb (164.202 kg)         Assessment & Plan:

## 2011-09-28 NOTE — Assessment & Plan Note (Signed)
Chronic OA/[employer] co wants him to switch to Tizanidine

## 2011-09-28 NOTE — Assessment & Plan Note (Signed)
Not drinking x several years

## 2011-09-28 NOTE — Assessment & Plan Note (Signed)
Better  

## 2011-09-28 NOTE — Assessment & Plan Note (Signed)
Continue with current prescription therapy as reflected on the Med list.  

## 2011-09-28 NOTE — Assessment & Plan Note (Signed)
Card cons offered

## 2011-09-28 NOTE — Assessment & Plan Note (Signed)
Continue with current prescription therapy as reflected on the Med list. Labs  

## 2011-09-29 ENCOUNTER — Telehealth: Payer: Self-pay | Admitting: *Deleted

## 2011-09-29 NOTE — Telephone Encounter (Signed)
Pt informed

## 2011-09-29 NOTE — Telephone Encounter (Signed)
Message copied by Merrilyn Puma on Fri Sep 29, 2011  5:05 PM ------      Message from: Janeal Holmes      Created: Thu Sep 28, 2011 10:41 PM       Misty Stanley, please, inform patient that all labs are OK      Thank you!

## 2011-10-19 ENCOUNTER — Encounter: Payer: Self-pay | Admitting: Gastroenterology

## 2011-11-16 ENCOUNTER — Ambulatory Visit (AMBULATORY_SURGERY_CENTER): Payer: Medicare Other | Admitting: *Deleted

## 2011-11-16 VITALS — Ht 75.0 in | Wt 325.0 lb

## 2011-11-16 DIAGNOSIS — Z1211 Encounter for screening for malignant neoplasm of colon: Secondary | ICD-10-CM

## 2011-11-16 MED ORDER — PEG-KCL-NACL-NASULF-NA ASC-C 100 G PO SOLR: ORAL | Status: DC

## 2011-11-30 ENCOUNTER — Ambulatory Visit (AMBULATORY_SURGERY_CENTER): Payer: Medicare Other | Admitting: Gastroenterology

## 2011-11-30 ENCOUNTER — Encounter: Payer: Self-pay | Admitting: Gastroenterology

## 2011-11-30 VITALS — BP 103/73 | HR 62 | Temp 96.3°F | Resp 14 | Ht 75.0 in | Wt 325.0 lb

## 2011-11-30 DIAGNOSIS — D126 Benign neoplasm of colon, unspecified: Secondary | ICD-10-CM

## 2011-11-30 DIAGNOSIS — Z1211 Encounter for screening for malignant neoplasm of colon: Secondary | ICD-10-CM

## 2011-11-30 LAB — GLUCOSE, CAPILLARY: Glucose-Capillary: 87 mg/dL (ref 70–99)

## 2011-11-30 MED ORDER — SODIUM CHLORIDE 0.9 % IV SOLN: 500.0000 mL | INTRAVENOUS | Status: DC

## 2011-11-30 NOTE — Progress Notes (Signed)
No complaints noted in the recovery room. Maw  Patient did not experience any of the following events: a burn prior to discharge; a fall within the facility; wrong site/side/patient/procedure/implant event; or a hospital transfer or hospital admission upon discharge from the facility. (G8907) Patient did not have preoperative order for IV antibiotic SSI prophylaxis. (G8918)  

## 2011-11-30 NOTE — Op Note (Signed)
[facility] [address] [place], [place]  [address]  COLONOSCOPY PROCEDURE REPORT PATIENT:  Brian Mccullough, Brian Mccullough  MR#:  [id number] BIRTHDATE:  1951/04/21, 60 yrs. old  GENDER:  male ENDOSCOPIST:  Judie Petit T. Russella Dar, MD, [facility] Referred by:  Linda Hedges. [name], M.D. PROCEDURE DATE:  11/30/2011 PROCEDURE:  Colonoscopy with snare polypectomy ASA CLASS:  Class II INDICATIONS:  1) Routine Risk Screening MEDICATIONS:   These medications were titrated to patient response per physician's verbal order, Fentanyl 150 mcg IV, Versed 14 mg IV DESCRIPTION OF PROCEDURE:   After the risks benefits and alternatives of the procedure were thoroughly explained, informed consent was obtained.  Digital rectal exam was performed and revealed no abnormalities.   The [id number] endoscope was introduced through the anus and advanced to the cecum, which was identified by both the appendix and ileocecal valve, without limitations.  The quality of the prep was good, using MoviPrep. The instrument was then slowly withdrawn as the colon was fully examined. <<PROCEDUREIMAGES>> FINDINGS:  A sessile polyp was found in the mid transverse colon. It was 6 mm in size. Polyp was snared without cautery. Retrieval was successful. Otherwise normal colonoscopy without other polyps, masses, vascular ectasias, or inflammatory changes.  Retroflexed views in the rectum revealed no abnormalities.  The time to cecum =  5.5  minutes. The scope was then withdrawn (time =  13.5  min) from the patient and the procedure completed.  COMPLICATIONS:  None  ENDOSCOPIC IMPRESSION: 1) 6 mm sessile polyp in the mid transverse colon  RECOMMENDATIONS: 1) Await pathology results 2) Consider sedation with MAC for future procedures 3) If the polyp is adenomatous (pre-cancerous) polyps, repeat colonoscopy in 5 years. Otherwise follow colorectal cancer screening guidelines for "routine risk" patients with colonoscopy in 10  years.  Venita Lick. Russella Dar, MD, [facility]  n. eSIGNED:   Venita Lick. Kena Limon at 11/30/2011 10:16 AM  Audie Pinto, [id number]

## 2011-11-30 NOTE — Patient Instructions (Addendum)
Please resume your prior medications today.  Call if any questions or concerns. Blood sugar 87.  Info sheet given on polyps.  YOU HAD AN ENDOSCOPIC PROCEDURE TODAY AT THE [facility]: Refer to the procedure report that was given to you for any specific questions about what was found during the examination.  If the procedure report does not answer your questions, please call your gastroenterologist to clarify.  If you requested that your care partner not be given the details of your procedure findings, then the procedure report has been included in a sealed envelope for you to review at your convenience later.  YOU SHOULD EXPECT: Some feelings of bloating in the abdomen. Passage of more gas than usual.  Walking can help get rid of the air that was put into your GI tract during the procedure and reduce the bloating. If you had a lower endoscopy (such as a colonoscopy or flexible sigmoidoscopy) you may notice spotting of blood in your stool or on the toilet paper. If you underwent a bowel prep for your procedure, then you may not have a normal bowel movement for a few days.  DIET: Your first meal following the procedure should be a light meal and then it is ok to progress to your normal diet.  A half-sandwich or bowl of soup is an example of a good first meal.  Heavy or fried foods are harder to digest and may make you feel nauseous or bloated.  Likewise meals heavy in dairy and vegetables can cause extra gas to form and this can also increase the bloating.  Drink plenty of fluids but you should avoid alcoholic beverages for 24 hours.  ACTIVITY: Your care partner should take you home directly after the procedure.  You should plan to take it easy, moving slowly for the rest of the day.  You can resume normal activity the day after the procedure however you should NOT DRIVE or use heavy machinery for 24 hours (because of the sedation medicines used during the test).    SYMPTOMS TO REPORT  IMMEDIATELY: A gastroenterologist can be reached at any hour.  During normal business hours, 8:30 AM to 5:00 PM Monday through Friday, call [phone].  After hours and on weekends, please call the GI answering service at [phone] who will take a message and have the physician on call contact you.   Following lower endoscopy (colonoscopy or flexible sigmoidoscopy):  Excessive amounts of blood in the stool  Significant tenderness or worsening of abdominal pains  Swelling of the abdomen that is new, acute  Fever of 100F or higher  Following upper endoscopy (EGD)  Vomiting of blood or coffee ground material  New chest pain or pain under the shoulder blades  Painful or persistently difficult swallowing  New shortness of breath  Fever of 100F or higher  Black, tarry-looking stools  FOLLOW UP: If any biopsies were taken you will be contacted by phone or by letter within the next 1-3 weeks.  Call your gastroenterologist if you have not heard about the biopsies in 3 weeks.  Our staff will call the home number listed on your records the next business day following your procedure to check on you and address any questions or concerns that you may have at that time regarding the information given to you following your procedure. This is a courtesy call and so if there is no answer at the home number and we have not heard from you through the emergency  physician on call, we will assume that you have returned to your regular daily activities without incident.  SIGNATURES/CONFIDENTIALITY: You and/or your care partner have signed paperwork which will be entered into your electronic medical record.  These signatures attest to the fact that that the information above on your After Visit Summary has been reviewed and is understood.  Full responsibility of the confidentiality of this discharge information lies with you and/or your care-partner.

## 2011-12-01 ENCOUNTER — Telehealth: Payer: Self-pay | Admitting: *Deleted

## 2011-12-01 NOTE — Telephone Encounter (Signed)
  Follow up Call-  Call back number 11/30/2011  Post procedure Call Back phone  # [phone]  Permission to leave phone message Yes     Patient questions:  Do you have a fever, pain , or abdominal swelling? no Pain Score  0 *  Have you tolerated food without any problems? yes  Have you been able to return to your normal activities? yes  Do you have any questions about your discharge instructions: Diet   no Medications  no Follow up visit  no  Do you have questions or concerns about your Care? no  Actions: * If pain score is 4 or above: No action needed, pain <4.

## 2011-12-04 ENCOUNTER — Other Ambulatory Visit: Payer: Self-pay | Admitting: Internal Medicine

## 2011-12-05 ENCOUNTER — Encounter: Payer: Self-pay | Admitting: Gastroenterology

## 2011-12-21 ENCOUNTER — Other Ambulatory Visit: Payer: Self-pay | Admitting: Internal Medicine

## 2011-12-21 NOTE — Telephone Encounter (Signed)
Please advise re: refills for below requests:     etodolac (LODINE) 400 MG tablet [Pharmacy Med Name: ETODOLAC 400 MG TABLET]   TAKE 1 TABLET BY MOUTH 2 TIMES A DAY AFTER MEALS   Disp: 60 tablet R: 5 Start: 12/21/2011  Class: Normal   Requested on: 06/26/2011   Originally ordered on: 01/17/2011  Last refill: 11/27/2011  Order History and Details     glimepiride (AMARYL) 2 MG tablet [Pharmacy Med Name: GLIMEPIRIDE 2 MG TABLET]   TAKE 1 TABLET BY MOUTH 2 TIMES A DAY   Disp: 60 tablet R: 5 Start: 12/21/2011  Class: Normal   Requested on: 06/26/2011   Originally ordered on: 01/17/2011  Last refill: 11/27/2011  Order History and Details    traMADol (ULTRAM) 50 MG tablet [Pharmacy Med Name: TRAMADOL HCL 50 MG TABLET]   The source prescription has been discontinued.   TAKE 1 TO 2 TABLETS BY MOUTH TWICE A DAY AS NEEDED FOR PAIN   Disp: 100 tablet R: 3 Start: 12/21/2011  Class: Normal   Requested on: 05/29/2011   Originally ordered on: 05/29/2011  Last refill: 10/23/2011

## 2012-01-08 ENCOUNTER — Other Ambulatory Visit: Payer: Self-pay | Admitting: Internal Medicine

## 2012-01-29 ENCOUNTER — Ambulatory Visit (INDEPENDENT_AMBULATORY_CARE_PROVIDER_SITE_OTHER): Payer: Medicare Other | Admitting: Internal Medicine

## 2012-01-29 ENCOUNTER — Encounter: Payer: Self-pay | Admitting: Internal Medicine

## 2012-01-29 ENCOUNTER — Other Ambulatory Visit (INDEPENDENT_AMBULATORY_CARE_PROVIDER_SITE_OTHER): Payer: Medicare Other

## 2012-01-29 VITALS — BP 98/68 | HR 80 | Temp 98.4°F | Resp 16 | Wt 314.0 lb

## 2012-01-29 DIAGNOSIS — Z136 Encounter for screening for cardiovascular disorders: Secondary | ICD-10-CM

## 2012-01-29 DIAGNOSIS — N320 Bladder-neck obstruction: Secondary | ICD-10-CM

## 2012-01-29 DIAGNOSIS — F411 Generalized anxiety disorder: Secondary | ICD-10-CM

## 2012-01-29 DIAGNOSIS — E119 Type 2 diabetes mellitus without complications: Secondary | ICD-10-CM

## 2012-01-29 DIAGNOSIS — Z125 Encounter for screening for malignant neoplasm of prostate: Secondary | ICD-10-CM

## 2012-01-29 DIAGNOSIS — I10 Essential (primary) hypertension: Secondary | ICD-10-CM

## 2012-01-29 DIAGNOSIS — R002 Palpitations: Secondary | ICD-10-CM

## 2012-01-29 DIAGNOSIS — E538 Deficiency of other specified B group vitamins: Secondary | ICD-10-CM

## 2012-01-29 DIAGNOSIS — F1020 Alcohol dependence, uncomplicated: Secondary | ICD-10-CM

## 2012-01-29 DIAGNOSIS — M545 Low back pain: Secondary | ICD-10-CM

## 2012-01-29 DIAGNOSIS — Z23 Encounter for immunization: Secondary | ICD-10-CM

## 2012-01-29 DIAGNOSIS — G4700 Insomnia, unspecified: Secondary | ICD-10-CM

## 2012-01-29 LAB — BASIC METABOLIC PANEL
CO2: 22 mEq/L (ref 19–32)
Calcium: 9.3 mg/dL (ref 8.4–10.5)
Creatinine, Ser: 1.2 mg/dL (ref 0.4–1.5)
Glucose, Bld: 77 mg/dL (ref 70–99)
Sodium: 141 mEq/L (ref 135–145)

## 2012-01-29 MED ORDER — SILDENAFIL CITRATE 100 MG PO TABS: 100.0000 mg | ORAL_TABLET | ORAL | Status: DC | PRN

## 2012-01-29 NOTE — Assessment & Plan Note (Signed)
Continue with current prescription therapy as reflected on the Med list.  

## 2012-01-29 NOTE — Progress Notes (Signed)
Patient ID: Brian Mccullough, male   DOB: Aug 01, 1951, 61 y.o.   MRN: [id number]  Subjective:    Patient ID: Brian Mccullough, male    DOB: [birth year], 61 y.o.   MRN: [id number]  HPI  The patient presents for a follow-up of  chronic hypertension, chronic dyslipidemia, type 2 diabetes controlled with medicines  C/o palpitations episodes - rare - last happened yesturday   Wt Readings from Last 3 Encounters:  01/29/12 314 lb (142.429 kg)  11/30/11 325 lb (147.419 kg)  11/16/11 325 lb (147.419 kg)   BP Readings from Last 3 Encounters:  01/29/12 98/68  11/30/11 103/73  09/28/11 120/72     Review of Systems  Constitutional: Negative for appetite change, fatigue and unexpected weight change.  HENT: Negative for hearing loss, ear pain, nosebleeds, congestion, sore throat, sneezing, trouble swallowing and neck pain.   Eyes: Negative for itching and visual disturbance.  Respiratory: Negative for cough.   Cardiovascular: Positive for palpitations (rare once a yer 20-30 sec ). Negative for chest pain and leg swelling.  Gastrointestinal: Negative for nausea, vomiting, diarrhea, blood in stool and abdominal distention.  Genitourinary: Negative for frequency and hematuria.  Musculoskeletal: Negative for back pain, joint swelling and gait problem.  Skin: Negative for rash and wound.  Neurological: Negative for dizziness, tremors, speech difficulty, weakness, light-headedness, numbness and headaches.  Psychiatric/Behavioral: Negative for suicidal ideas, sleep disturbance, dysphoric mood and agitation. The patient is not nervous/anxious and is not hyperactive.        Objective:   Physical Exam  Constitutional: He is oriented to person, place, and time. He appears well-developed.       Obese   HENT:  Mouth/Throat: Oropharynx is clear and moist.  Eyes: Conjunctivae are normal. Pupils are equal, round, and reactive to light.  Neck: Normal range of motion. No JVD present. No thyromegaly present.    Cardiovascular: Normal rate, regular rhythm, normal heart sounds and intact distal pulses.  Exam reveals no gallop and no friction rub.   No murmur heard. Pulmonary/Chest: Effort normal and breath sounds normal. No respiratory distress. He has no wheezes. He has no rales. He exhibits no tenderness.  Abdominal: Soft. Bowel sounds are normal. He exhibits no distension and no mass. There is no tenderness. There is no rebound and no guarding.  Musculoskeletal: Normal range of motion. He exhibits no edema and no tenderness.  Lymphadenopathy:    He has no cervical adenopathy.  Neurological: He is alert and oriented to person, place, and time. He has normal reflexes. No cranial nerve deficit. He exhibits normal muscle tone. Coordination normal.  Skin: Skin is warm and dry. No rash noted.  Psychiatric: He has a normal mood and affect. His behavior is normal. Judgment and thought content normal.   Lab Results  Component Value Date   WBC 6.5 09/28/2011   HGB 15.9 09/28/2011   HCT 46.1 09/28/2011   PLT 201.0 09/28/2011   GLUCOSE 70 09/28/2011   CHOL 156 05/29/2011   TRIG 138.0 05/29/2011   HDL 29.70* 05/29/2011   LDLDIRECT 88.1 01/23/2011   LDLCALC 99 05/29/2011   ALT 14 05/29/2011   AST 18 05/29/2011   NA 140 09/28/2011   K 4.0 09/28/2011   CL 107 09/28/2011   CREATININE 1.2 09/28/2011   BUN 10 09/28/2011   CO2 24 09/28/2011   TSH 1.01 09/28/2011   PSA 0.19 09/20/2010   HGBA1C 5.8 09/28/2011   MICROALBUR 0.3 01/23/2011  Assessment & Plan:

## 2012-01-29 NOTE — Assessment & Plan Note (Signed)
On Pamelor 

## 2012-02-09 ENCOUNTER — Other Ambulatory Visit: Payer: Self-pay | Admitting: Internal Medicine

## 2012-02-20 ENCOUNTER — Telehealth: Payer: Self-pay | Admitting: *Deleted

## 2012-02-20 ENCOUNTER — Other Ambulatory Visit: Payer: Self-pay | Admitting: *Deleted

## 2012-02-20 ENCOUNTER — Telehealth: Payer: Self-pay

## 2012-02-20 MED ORDER — POTASSIUM CHLORIDE CRYS ER 20 MEQ PO TBCR: 20.0000 meq | EXTENDED_RELEASE_TABLET | Freq: Every day | ORAL | Status: DC

## 2012-02-20 MED ORDER — TIZANIDINE HCL 4 MG PO TABS: 4.0000 mg | ORAL_TABLET | Freq: Three times a day (TID) | ORAL | Status: DC | PRN

## 2012-02-20 MED ORDER — BENAZEPRIL HCL 40 MG PO TABS: 40.0000 mg | ORAL_TABLET | Freq: Every day | ORAL | Status: DC

## 2012-02-20 MED ORDER — ETODOLAC 400 MG PO TABS: 400.0000 mg | ORAL_TABLET | Freq: Two times a day (BID) | ORAL | Status: DC

## 2012-02-20 MED ORDER — GLIMEPIRIDE 2 MG PO TABS: 2.0000 mg | ORAL_TABLET | Freq: Two times a day (BID) | ORAL | Status: DC

## 2012-02-20 MED ORDER — FUROSEMIDE 40 MG PO TABS: 40.0000 mg | ORAL_TABLET | Freq: Every day | ORAL | Status: DC

## 2012-02-20 MED ORDER — AMLODIPINE BESYLATE 10 MG PO TABS: 10.0000 mg | ORAL_TABLET | Freq: Every day | ORAL | Status: DC

## 2012-02-20 NOTE — Telephone Encounter (Signed)
MOM 60 cc bid prn Thx

## 2012-02-20 NOTE — Telephone Encounter (Signed)
Rec requests to Rf meds for 90 day supply. Rfs sent.

## 2012-02-20 NOTE — Telephone Encounter (Signed)
Patient called triage lmovm c/o constipation x 2 weeks with no relief from OTC medication. Please advise

## 2012-04-05 ENCOUNTER — Emergency Department (HOSPITAL_COMMUNITY)
Admission: EM | Admit: 2012-04-05 | Discharge: 2012-04-05 | Disposition: A | Discharge status: Home / Self Care | Payer: Medicare Other | Attending: Emergency Medicine | Admitting: Emergency Medicine

## 2012-04-05 ENCOUNTER — Emergency Department (HOSPITAL_COMMUNITY): Payer: Medicare Other

## 2012-04-05 ENCOUNTER — Encounter (HOSPITAL_COMMUNITY): Payer: Self-pay | Admitting: Emergency Medicine

## 2012-04-05 DIAGNOSIS — L03211 Cellulitis of face: Secondary | ICD-10-CM | POA: Insufficient documentation

## 2012-04-05 DIAGNOSIS — E119 Type 2 diabetes mellitus without complications: Secondary | ICD-10-CM | POA: Insufficient documentation

## 2012-04-05 DIAGNOSIS — I10 Essential (primary) hypertension: Secondary | ICD-10-CM | POA: Insufficient documentation

## 2012-04-05 DIAGNOSIS — Z79899 Other long term (current) drug therapy: Secondary | ICD-10-CM | POA: Insufficient documentation

## 2012-04-05 DIAGNOSIS — L0201 Cutaneous abscess of face: Secondary | ICD-10-CM | POA: Insufficient documentation

## 2012-04-05 DIAGNOSIS — K047 Periapical abscess without sinus: Secondary | ICD-10-CM | POA: Insufficient documentation

## 2012-04-05 DIAGNOSIS — R51 Headache: Secondary | ICD-10-CM | POA: Insufficient documentation

## 2012-04-05 DIAGNOSIS — Z7982 Long term (current) use of aspirin: Secondary | ICD-10-CM | POA: Insufficient documentation

## 2012-04-05 MED ORDER — BUPIVACAINE HCL (PF) 0.25 % IJ SOLN
10.0000 mL | Freq: Once | INTRAMUSCULAR | Status: AC
  Administered 2012-04-05: 10 mL

## 2012-04-05 MED ORDER — CLINDAMYCIN HCL 150 MG PO CAPS: 300.0000 mg | ORAL_CAPSULE | Freq: Three times a day (TID) | ORAL | Status: AC

## 2012-04-05 MED ORDER — HYDROCODONE-ACETAMINOPHEN 5-325 MG PO TABS: 1.0000 | ORAL_TABLET | Freq: Four times a day (QID) | ORAL | Status: AC | PRN

## 2012-04-05 MED ORDER — BUPIVACAINE HCL (PF) 0.5 % IJ SOLN
INTRAMUSCULAR | Status: AC
  Filled 2012-04-05: qty 10

## 2012-04-05 MED ORDER — CLINDAMYCIN PHOSPHATE 600 MG/50ML IV SOLN
600.0000 mg | Freq: Once | INTRAVENOUS | Status: AC
  Administered 2012-04-05: 600 mg via INTRAVENOUS
  Filled 2012-04-05: qty 50

## 2012-04-05 NOTE — ED Notes (Signed)
Tripped and fell Monday evening while exercising. Unsure if he hit his head or not. No LOC. Patient remembers everything and is not amnesic to the event. Starting noticing teeth ache, right jaw soreness, right jaw swelling 1 day afterwards. Discomfort has been increasing in severity. Has taken ibuprofen with no relief. Denies memory loss, dizziness or syncope. States that he has a generalized headache, rate as 8/10. Describes as a constant dull. Alert and oriented x 4, resp e/u, neuro intact, NAD.

## 2012-04-05 NOTE — Discharge Instructions (Signed)
Your pain and swelling over your face is caused by a tooth infection. Apply warm compresses to that part of the face several times a day. Take clindamycin as prescribed until all gone for the infection. Listerine mouth washes every 2-3 hrs. Ibuprofen for pain. Vicodin for severe pain, do not drive if taking this medication. Follow up with an oral surgeon. Call today to get an appointment for next week. Return or come to closest ER if swelling worsening, or if develop high fever.   Abscessed Tooth A tooth abscess is a collection of infected fluid (pus) from a bacterial infection in the inner part of the tooth (pulp). It usually occurs at the end of the tooth's root.  CAUSES   A very bad cavity (extensive tooth decay).   Trauma to the tooth, such as a broken or chipped tooth, that allows bacteria to enter into the pulp.  SYMPTOMS  Severe pain in and around the infected tooth.   Swelling and redness around the abscessed tooth or in the mouth or face.   Tenderness.   Pus drainage.   Bad breath.   Bitter taste in the mouth.   Difficulty swallowing.   Difficulty opening the mouth.   Feeling sick to your stomach (nauseous).   Vomiting.   Chills.   Swollen neck glands.  DIAGNOSIS  A medical and dental history will be taken.   An examination will be performed by tapping on the abscessed tooth.   X-rays may be taken of the tooth to identify the abscess.  TREATMENT The goal of treatment is to eliminate the infection.   You may be prescribed antibiotic medicine to stop the infection from spreading.   A root canal may be performed to save the tooth. If the tooth cannot be saved, it may be pulled (extracted) and the abscess may be drained.  HOME CARE INSTRUCTIONS  Only take over-the-counter or prescription medicines for pain, fever, or discomfort as directed by your caregiver.   Do not drive after taking pain medicine (narcotics).   Rinse your mouth (gargle) often with salt  water ( tsp salt in 8 oz of warm water) to relieve pain or swelling.   Do not apply heat to the outside of your face.   Return to your dentist for further treatment as directed.  SEEK IMMEDIATE DENTAL CARE IF:  You have a temperature by mouth above 102 F (38.9 C), not controlled by medicine.   You have chills or a very bad headache.   You have problems breathing or swallowing.   Your have trouble opening your mouth.   You develop swelling in the neck or around the eye.   Your pain is not helped by medicine.   Your pain is getting worse instead of better.  Document Released: 09/25/2005 Document Revised: 09/14/2011 Document Reviewed: 01/03/2011 [facility] Patient Information 2012 [place], [place].

## 2012-04-05 NOTE — ED Provider Notes (Signed)
History     CSN: [id number]  Arrival date & time 04/05/12  0243   First MD Initiated Contact with Patient 04/05/12 [phone]      Chief Complaint  Patient presents with  . Fall    (Consider location/radiation/quality/duration/timing/severity/associated sxs/prior treatment) Patient is a 61 y.o. male presenting with facial injury. The history is provided by the patient.  Facial Injury  The incident occurred more than 2 days ago. Pertinent negatives include no numbness, no headaches and no weakness.  Pt states he fell 5 days. States he does not believe he hit his head. States since then developed pain in the right upper jaw going up his right cheek. States has been swelling since then. Denies fever, chills, malaise. Not sure if related to his head injury. No other complains.   Past Medical History  Diagnosis Date  . Diabetes mellitus   . Hypertension   . Anxiety   . ED (erectile dysfunction)   . Vitamin B 12 deficiency   . Alcoholism   . OA (osteoarthritis) of knee   . LBP (low back pain)     Past Surgical History  Procedure Date  . Total knee arthroplasty     left  . Knee arthroscopy     right- Dr. Thelma Comp  . Inguinal hernia repair 1958  . Tonsillectomy and adenoidectomy 1960    Family History  Problem Relation Age of Onset  . Hypertension Other   . Hypertension Mother   . Diabetes Mother   . Diabetes Father     History  Substance Use Topics  . Smoking status: Former Games developer  . Smokeless tobacco: Never Used  . Alcohol Use: No     It was up to 2 gallon of Vodka/week.  8 wks. off ETOH since 7/9      Review of Systems  Constitutional: Negative for fever and chills.  HENT: Positive for facial swelling and dental problem. Negative for ear pain, congestion, rhinorrhea, mouth sores and neck stiffness.   Respiratory: Negative.   Cardiovascular: Negative.   Musculoskeletal: Negative.   Skin: Negative.   Neurological: Negative for weakness, numbness and headaches.      Allergies  Codeine  Home Medications   Current Outpatient Rx  Name Route Sig Dispense Refill  . AMLODIPINE BESYLATE 10 MG PO TABS Oral Take 1 tablet (10 mg total) by mouth daily. 90 tablet 2  . ASPIRIN 81 MG PO TBEC Oral Take 81 mg by mouth daily.      Marland Kitchen BENAZEPRIL HCL 40 MG PO TABS Oral Take 1 tablet (40 mg total) by mouth daily. 90 tablet 2  . VITAMIN D3 1000 UNITS PO TABS Oral Take 1,000 Units by mouth daily.      Marland Kitchen CLOTRIMAZOLE-BETAMETHASONE 1-0.05 % EX CREA Topical Apply topically 2 (two) times daily. 135 g 1  . CYANOCOBALAMIN 100 MCG PO TABS Oral Take 100 mcg by mouth daily.      . ETODOLAC 400 MG PO TABS Oral Take 1 tablet (400 mg total) by mouth 2 (two) times daily. 180 tablet 2  . FUROSEMIDE 40 MG PO TABS Oral Take 1 tablet (40 mg total) by mouth daily. 90 tablet 2  . GLIMEPIRIDE 2 MG PO TABS Oral Take 1 tablet (2 mg total) by mouth 2 (two) times daily. 180 tablet 2  . NORTRIPTYLINE HCL 25 MG PO CAPS Oral Take 1-2 capsules (25-50 mg total) by mouth at bedtime. 60 capsule 5  . OMEGA-3-ACID ETHYL ESTERS 1 G PO CAPS  Oral Take 1 g by mouth daily.    Marland Kitchen POTASSIUM CHLORIDE CRYS ER 20 MEQ PO TBCR Oral Take 1 tablet (20 mEq total) by mouth daily. 90 tablet 2  . TIZANIDINE HCL 4 MG PO TABS Oral Take 1 tablet (4 mg total) by mouth every 8 (eight) hours as needed. 90 tablet 2  . TRAMADOL HCL 50 MG PO TABS Oral Take 50 mg by mouth every 6 (six) hours as needed.    . TRIAMCINOLONE ACETONIDE 0.1 % EX OINT Topical Apply 1 application topically 2 (two) times daily at 10 AM and 5 PM.      . SILDENAFIL CITRATE 100 MG PO TABS Oral Take 1 tablet (100 mg total) by mouth as needed for erectile dysfunction. 12 tablet 11  . TRAMADOL HCL 50 MG PO TABS Oral Take 50-100 mg by mouth 2 (two) times daily as needed.        BP 154/75  Pulse 75  Temp 99.6 F (37.6 C) (Oral)  Resp 14  SpO2 99%  Physical Exam  Nursing note and vitals reviewed. Constitutional: He appears well-developed and  well-nourished. No distress.  HENT:  Head: Atraumatic.  Right Ear: External ear normal.  Left Ear: External ear normal.  Nose: Nose normal.  Mouth/Throat: Oropharynx is clear and moist.       Erythema and swelling over right upper lip extending into right maxilla. Poor dentition. Swelling over right upper lateral incisor. Tender to palpation.   Eyes: EOM are normal. Pupils are equal, round, and reactive to light.  Neck: Normal range of motion. Neck supple.  Cardiovascular: Normal rate, regular rhythm and normal heart sounds.   Pulmonary/Chest: Effort normal and breath sounds normal. No respiratory distress. He has no wheezes. He has no rales.  Lymphadenopathy:    He has no cervical adenopathy.  Neurological: He is alert. No cranial nerve deficit. Coordination normal.  Skin: Skin is warm and dry.  Psychiatric: He has a normal mood and affect.    ED Course  Procedures (including critical care time)  Labs Reviewed - No data to display Ct Head Wo Contrast  04/05/2012  *RADIOLOGY REPORT*  Clinical Data:  The patient fell on Monday.  Frontal and posterior headache.  Right-sided facial pain.  Swollen upper lip.  CT HEAD WITHOUT CONTRAST CT MAXILLOFACIAL WITHOUT CONTRAST  Technique:  Multidetector CT imaging of the head and maxillofacial structures were performed using the standard protocol without intravenous contrast. Multiplanar CT image reconstructions of the maxillofacial structures were also generated.  Comparison:   None.  CT HEAD  Findings: The the ventricles and sulci appear symmetrical.  No mass effect or midline shift.  No abnormal extra-axial fluid collections.  The gray-white matter junctions are distinct.  The basal cisterns are not effaced.  Calcification along the falx and frontal calvarium.  No depressed skull fractures.  Visualized mastoid air cells are not opacified.  Vascular calcifications.  IMPRESSION: No acute intracranial abnormalities.  CT MAXILLOFACIAL  Findings:    Subcutaneous soft tissue foreign bodies versus calcification in the anterior frontal region.  Soft tissue swelling inferior to the right orbit and extending along the right and anterior side of the mouth and mandible. Scattered soft tissue calcifications or foreign bodies in the subcutaneous fat bilaterally.  Mild mucosal membrane thickening in the right maxillary antrum.  No opacification of the paranasal sinuses.  No acute air-fluid levels.  There is a defect in the right lamina paprecia with mild herniation of medial rectus muscles.  There  is no associated soft tissue swelling or infiltration and no discrete cortical break is identified.  This is likely to represent chronic change.  No definite evidence of any acute fractures of the orbital rims, maxillary antral walls, nasal bones, nasal septum, pterygoid plates, zygomatic arches, mandibles, or temporomandibular joints. There appears to be poor dentition with prior tooth extractions and multiple dental caries.  Peri apical lucencies around multiple teeth may suggest periodontal disease.  Incidental note of concha bullosa in the middle turbinates.  Globes and extraocular muscles appear symmetrical.  IMPRESSION: Soft tissue swelling over the right side of the face and mandible. No underlying acute fractures are demonstrated.  There is an old appearing fracture of the medial wall of the right orbit with some herniation of orbital fat and extraocular muscles.  Original Report Authenticated By: Marlon Pel, M.D.   Ct Maxillofacial Wo Cm  04/05/2012  *RADIOLOGY REPORT*  Clinical Data:  The patient fell on Monday.  Frontal and posterior headache.  Right-sided facial pain.  Swollen upper lip.  CT HEAD WITHOUT CONTRAST CT MAXILLOFACIAL WITHOUT CONTRAST  Technique:  Multidetector CT imaging of the head and maxillofacial structures were performed using the standard protocol without intravenous contrast. Multiplanar CT image reconstructions of the maxillofacial  structures were also generated.  Comparison:   None.  CT HEAD  Findings: The the ventricles and sulci appear symmetrical.  No mass effect or midline shift.  No abnormal extra-axial fluid collections.  The gray-white matter junctions are distinct.  The basal cisterns are not effaced.  Calcification along the falx and frontal calvarium.  No depressed skull fractures.  Visualized mastoid air cells are not opacified.  Vascular calcifications.  IMPRESSION: No acute intracranial abnormalities.  CT MAXILLOFACIAL  Findings:   Subcutaneous soft tissue foreign bodies versus calcification in the anterior frontal region.  Soft tissue swelling inferior to the right orbit and extending along the right and anterior side of the mouth and mandible. Scattered soft tissue calcifications or foreign bodies in the subcutaneous fat bilaterally.  Mild mucosal membrane thickening in the right maxillary antrum.  No opacification of the paranasal sinuses.  No acute air-fluid levels.  There is a defect in the right lamina paprecia with mild herniation of medial rectus muscles.  There is no associated soft tissue swelling or infiltration and no discrete cortical break is identified.  This is likely to represent chronic change.  No definite evidence of any acute fractures of the orbital rims, maxillary antral walls, nasal bones, nasal septum, pterygoid plates, zygomatic arches, mandibles, or temporomandibular joints. There appears to be poor dentition with prior tooth extractions and multiple dental caries.  Peri apical lucencies around multiple teeth may suggest periodontal disease.  Incidental note of concha bullosa in the middle turbinates.  Globes and extraocular muscles appear symmetrical.  IMPRESSION: Soft tissue swelling over the right side of the face and mandible. No underlying acute fractures are demonstrated.  There is an old appearing fracture of the medial wall of the right orbit with some herniation of orbital fat and extraocular  muscles.  Original Report Authenticated By: Marlon Pel, M.D.   7:29 AM I do not think pt's fall is related to his facial swelling. He has an abscess of the right upper lateral incisor, with extending cellulitis up right cheek. Will I&D the abscess, clindamycin IV started here. He is non toxic, afebrile.   INCISION AND DRAINAGE Performed by: Jaynie Crumble A Consent: Verbal consent obtained. Risks and benefits: risks, benefits and alternatives  were discussed Type: abscess  Body area: right upper lateral incisor  Anesthesia: superior alveolar nerve block  Local anesthetic: Marcaine 0.25% wo epinephrine  Anesthetic total: 3 ml  Complexity: complex Blunt dissection to break up loculations  Drainage: purulent  Drainage amount: large  Patient tolerance: Patient tolerated the procedure well with no immediate complications.  8:57 AM Pt received 600mg  of IV clindamycin for the infection. Tooth I&Ded with large amount of purulent drainage. Pt feeling better. He is non toxic. Will start on clindamycin at home, follow up with oral surgery.   1. Dental abscess   2. Cellulitis of face       MDM          Lottie Mussel, PA 04/05/12 [phone]

## 2012-04-05 NOTE — ED Notes (Signed)
Pt states he tripped on sidewalk and fell on Monday.  Doesn't remember hitting face.  C/o headache and swelling to R side of face since fall.  Denies LOC.

## 2012-04-07 NOTE — ED Provider Notes (Signed)
Medical screening examination/treatment/procedure(s) were performed by non-physician practitioner and as supervising physician I was immediately available for consultation/collaboration.  Jasmine Awe, MD 04/07/12 2329

## 2012-05-27 ENCOUNTER — Other Ambulatory Visit (INDEPENDENT_AMBULATORY_CARE_PROVIDER_SITE_OTHER): Payer: Medicare Other

## 2012-05-27 ENCOUNTER — Ambulatory Visit (INDEPENDENT_AMBULATORY_CARE_PROVIDER_SITE_OTHER): Payer: Medicare Other | Admitting: Internal Medicine

## 2012-05-27 ENCOUNTER — Encounter: Payer: Self-pay | Admitting: Internal Medicine

## 2012-05-27 VITALS — BP 110/80 | HR 72 | Temp 98.4°F | Resp 16 | Wt 291.0 lb

## 2012-05-27 DIAGNOSIS — N529 Male erectile dysfunction, unspecified: Secondary | ICD-10-CM

## 2012-05-27 DIAGNOSIS — E785 Hyperlipidemia, unspecified: Secondary | ICD-10-CM

## 2012-05-27 DIAGNOSIS — E538 Deficiency of other specified B group vitamins: Secondary | ICD-10-CM

## 2012-05-27 DIAGNOSIS — M25569 Pain in unspecified knee: Secondary | ICD-10-CM

## 2012-05-27 DIAGNOSIS — E669 Obesity, unspecified: Secondary | ICD-10-CM

## 2012-05-27 DIAGNOSIS — E119 Type 2 diabetes mellitus without complications: Secondary | ICD-10-CM

## 2012-05-27 DIAGNOSIS — I10 Essential (primary) hypertension: Secondary | ICD-10-CM

## 2012-05-27 DIAGNOSIS — M545 Low back pain, unspecified: Secondary | ICD-10-CM

## 2012-05-27 DIAGNOSIS — Z79899 Other long term (current) drug therapy: Secondary | ICD-10-CM

## 2012-05-27 DIAGNOSIS — F1020 Alcohol dependence, uncomplicated: Secondary | ICD-10-CM

## 2012-05-27 LAB — BASIC METABOLIC PANEL
Chloride: 109 mEq/L (ref 96–112)
Creatinine, Ser: 1.1 mg/dL (ref 0.4–1.5)
GFR: 92.58 mL/min (ref >60.00)
Potassium: 3.8 mEq/L (ref 3.5–5.1)

## 2012-05-27 LAB — TESTOSTERONE: Testosterone: 301.03 ng/dL — ABNORMAL LOW (ref 350.00–890.00)

## 2012-05-27 LAB — HEMOGLOBIN A1C: Hgb A1c MFr Bld: 5.1 % (ref 4.6–6.5)

## 2012-05-27 MED ORDER — TADALAFIL 5 MG PO TABS: 5.0000 mg | ORAL_TABLET | Freq: Every day | ORAL | Status: DC | PRN

## 2012-05-27 NOTE — Assessment & Plan Note (Signed)
Wt loss  

## 2012-05-27 NOTE — Assessment & Plan Note (Signed)
Organic  Viagra doesn't work Will try daily cialis

## 2012-05-27 NOTE — Assessment & Plan Note (Signed)
Doing well 

## 2012-05-27 NOTE — Assessment & Plan Note (Addendum)
Continue with current prescription therapy as reflected on the Med list. We can cut back on rx dose

## 2012-05-27 NOTE — Patient Instructions (Addendum)
Wt Readings from Last 3 Encounters:  05/27/12 291 lb (131.997 kg)  01/29/12 314 lb (142.429 kg)  11/30/11 325 lb (147.419 kg)   Take Amlodipine 1/2 tab a day You can take Furosemide 20 mg every other day

## 2012-05-27 NOTE — Assessment & Plan Note (Signed)
Continue with current prescription therapy as reflected on the Med list. Better w/wt loss

## 2012-05-27 NOTE — Assessment & Plan Note (Signed)
Continue with current prescription therapy as reflected on the Med list.  

## 2012-05-27 NOTE — Assessment & Plan Note (Signed)
Cont w/wt lost

## 2012-05-27 NOTE — Progress Notes (Signed)
Patient ID: Brian Mccullough, male   DOB: [birth year], 61 y.o.   MRN: [id number] Patient ID: Brian Mccullough, male   DOB: 25-Aug-1951, 61 y.o.   MRN: [id number]  Subjective:    Patient ID: Brian Mccullough, male    DOB: 04-Feb-1951, 61 y.o.   MRN: [id number]  HPI  The patient presents for a follow-up of  chronic hypertension, chronic dyslipidemia, type 2 diabetes controlled with medicines. He lost wt on diet  F/u palpitations episodes - rare or none    Wt Readings from Last 3 Encounters:  05/27/12 291 lb (131.997 kg)  01/29/12 314 lb (142.429 kg)  11/30/11 325 lb (147.419 kg)   BP Readings from Last 3 Encounters:  05/27/12 110/80  04/05/12 154/75  01/29/12 98/68     Review of Systems  Constitutional: Negative for appetite change, fatigue and unexpected weight change.  HENT: Negative for hearing loss, ear pain, nosebleeds, congestion, sore throat, sneezing, trouble swallowing and neck pain.   Eyes: Negative for itching and visual disturbance.  Respiratory: Negative for cough.   Cardiovascular: Negative for chest pain, palpitations and leg swelling.  Gastrointestinal: Negative for nausea, vomiting, diarrhea, blood in stool and abdominal distention.  Genitourinary: Negative for frequency and hematuria.  Musculoskeletal: Negative for back pain, joint swelling and gait problem.  Skin: Negative for rash and wound.  Neurological: Negative for dizziness, tremors, speech difficulty, weakness, light-headedness, numbness and headaches.  Psychiatric/Behavioral: Negative for suicidal ideas, disturbed wake/sleep cycle, dysphoric mood and agitation. The patient is not nervous/anxious and is not hyperactive.        Objective:   Physical Exam  Constitutional: He is oriented to person, place, and time. He appears well-developed.       Obese   HENT:  Mouth/Throat: Oropharynx is clear and moist.  Eyes: Conjunctivae are normal. Pupils are equal, round, and reactive to light.  Neck: Normal range of  motion. No JVD present. No thyromegaly present.  Cardiovascular: Normal rate, regular rhythm, normal heart sounds and intact distal pulses.  Exam reveals no gallop and no friction rub.   No murmur heard. Pulmonary/Chest: Effort normal and breath sounds normal. No respiratory distress. He has no wheezes. He has no rales. He exhibits no tenderness.  Abdominal: Soft. Bowel sounds are normal. He exhibits no distension and no mass. There is no tenderness. There is no rebound and no guarding.  Musculoskeletal: Normal range of motion. He exhibits no edema and no tenderness.  Lymphadenopathy:    He has no cervical adenopathy.  Neurological: He is alert and oriented to person, place, and time. He has normal reflexes. No cranial nerve deficit. He exhibits normal muscle tone. Coordination normal.  Skin: Skin is warm and dry. No rash noted.  Psychiatric: He has a normal mood and affect. His behavior is normal. Judgment and thought content normal.   Lab Results  Component Value Date   WBC 6.5 09/28/2011   HGB 15.9 09/28/2011   HCT 46.1 09/28/2011   PLT 201.0 09/28/2011   GLUCOSE 77 01/29/2012   CHOL 156 05/29/2011   TRIG 138.0 05/29/2011   HDL 29.70* 05/29/2011   LDLDIRECT 88.1 01/23/2011   LDLCALC 99 05/29/2011   ALT 14 05/29/2011   AST 18 05/29/2011   NA 141 01/29/2012   K 4.1 01/29/2012   CL 111 01/29/2012   CREATININE 1.2 01/29/2012   BUN 13 01/29/2012   CO2 22 01/29/2012   TSH 1.01 09/28/2011   PSA 0.30 01/29/2012   HGBA1C 5.4 01/29/2012   MICROALBUR 0.3  01/23/2011           Assessment & Plan:

## 2012-05-28 ENCOUNTER — Telehealth: Payer: Self-pay | Admitting: Internal Medicine

## 2012-05-28 NOTE — Telephone Encounter (Signed)
Pt informed. OV scheduled.

## 2012-05-28 NOTE — Telephone Encounter (Signed)
Brian Mccullough, please, inform patient that all labs are normal except for low testosterone. OV if he would like to discuss replacement therapy.OK to w/in Thx

## 2012-05-29 ENCOUNTER — Ambulatory Visit (INDEPENDENT_AMBULATORY_CARE_PROVIDER_SITE_OTHER): Payer: Medicare Other | Admitting: Internal Medicine

## 2012-05-29 ENCOUNTER — Encounter: Payer: Self-pay | Admitting: Internal Medicine

## 2012-05-29 VITALS — BP 100/64 | HR 76 | Temp 98.1°F | Resp 16 | Wt 291.0 lb

## 2012-05-29 DIAGNOSIS — E291 Testicular hypofunction: Secondary | ICD-10-CM

## 2012-05-29 MED ORDER — TESTOSTERONE 20.25 MG/ACT (1.62%) TD GEL: 2.0000 | TRANSDERMAL | Status: DC

## 2012-05-29 NOTE — Progress Notes (Signed)
  Subjective:    Patient ID: Brian Mccullough, male    DOB: August 05, 1951, 61 y.o.   MRN: [id number]  HPI  To discuss low testosterone   Review of Systems     Objective:   Physical Exam    NE 20 min discussion    Assessment & Plan:

## 2012-05-29 NOTE — Assessment & Plan Note (Addendum)
Start Androgel 20 min discussion FTF

## 2012-06-03 ENCOUNTER — Telehealth: Payer: Self-pay

## 2012-06-03 MED ORDER — TESTOSTERONE CYPIONATE 200 MG/ML IM SOLN: 200.0000 mg | INTRAMUSCULAR | Status: DC

## 2012-06-03 NOTE — Telephone Encounter (Signed)
It is going to be IM Testosterone. He will have to have q 2 wks shots here OK Rx  Thx

## 2012-06-03 NOTE — Telephone Encounter (Signed)
Pt called stating that Rx for Androgel is too expensive and discount card cannot be used because he has Medicare. Pt is requesting a cheaper alternative, please advise.

## 2012-06-04 NOTE — Telephone Encounter (Signed)
Pt informed, Rx in cabinet for pt pick up. Pt will call to schedule nurse visit once medication is picked up

## 2012-06-05 ENCOUNTER — Ambulatory Visit (INDEPENDENT_AMBULATORY_CARE_PROVIDER_SITE_OTHER): Payer: Medicare Other | Admitting: General Practice

## 2012-06-05 DIAGNOSIS — N529 Male erectile dysfunction, unspecified: Secondary | ICD-10-CM

## 2012-06-05 DIAGNOSIS — E291 Testicular hypofunction: Secondary | ICD-10-CM

## 2012-06-05 MED ORDER — TESTOSTERONE CYPIONATE 200 MG/ML IM SOLN: 200.0000 mg | INTRAMUSCULAR | Status: DC

## 2012-06-19 ENCOUNTER — Ambulatory Visit (INDEPENDENT_AMBULATORY_CARE_PROVIDER_SITE_OTHER): Payer: Medicare Other | Admitting: *Deleted

## 2012-06-19 DIAGNOSIS — E291 Testicular hypofunction: Secondary | ICD-10-CM

## 2012-06-19 DIAGNOSIS — Z23 Encounter for immunization: Secondary | ICD-10-CM

## 2012-06-19 MED ORDER — TESTOSTERONE CYPIONATE 200 MG/ML IM SOLN
200.0000 mg | Freq: Once | INTRAMUSCULAR | Status: AC
  Administered 2012-06-19: 200 mg via INTRAMUSCULAR

## 2012-06-25 ENCOUNTER — Other Ambulatory Visit: Payer: Self-pay | Admitting: Internal Medicine

## 2012-07-03 ENCOUNTER — Ambulatory Visit (INDEPENDENT_AMBULATORY_CARE_PROVIDER_SITE_OTHER): Payer: Medicare Other

## 2012-07-03 DIAGNOSIS — E291 Testicular hypofunction: Secondary | ICD-10-CM

## 2012-07-03 MED ORDER — TESTOSTERONE CYPIONATE 200 MG/ML IM SOLN
200.0000 mg | Freq: Once | INTRAMUSCULAR | Status: AC
  Administered 2012-07-03: 200 mg via INTRAMUSCULAR

## 2012-07-17 ENCOUNTER — Ambulatory Visit (INDEPENDENT_AMBULATORY_CARE_PROVIDER_SITE_OTHER): Payer: Medicare Other | Admitting: *Deleted

## 2012-07-17 DIAGNOSIS — E291 Testicular hypofunction: Secondary | ICD-10-CM

## 2012-07-17 MED ORDER — TESTOSTERONE CYPIONATE 200 MG/ML IM SOLN
200.0000 mg | Freq: Once | INTRAMUSCULAR | Status: AC
  Administered 2012-07-17: 200 mg via INTRAMUSCULAR

## 2012-07-31 ENCOUNTER — Ambulatory Visit (INDEPENDENT_AMBULATORY_CARE_PROVIDER_SITE_OTHER): Payer: Medicare Other

## 2012-07-31 DIAGNOSIS — E291 Testicular hypofunction: Secondary | ICD-10-CM

## 2012-07-31 MED ORDER — TESTOSTERONE CYPIONATE 200 MG/ML IM SOLN
200.0000 mg | Freq: Once | INTRAMUSCULAR | Status: AC
  Administered 2012-07-31: 200 mg via INTRAMUSCULAR

## 2012-08-14 ENCOUNTER — Ambulatory Visit (INDEPENDENT_AMBULATORY_CARE_PROVIDER_SITE_OTHER): Payer: Medicare Other

## 2012-08-14 DIAGNOSIS — E291 Testicular hypofunction: Secondary | ICD-10-CM

## 2012-08-14 MED ORDER — TESTOSTERONE CYPIONATE 200 MG/ML IM SOLN
200.0000 mg | Freq: Once | INTRAMUSCULAR | Status: AC
  Administered 2012-08-14: 200 mg via INTRAMUSCULAR

## 2012-08-28 ENCOUNTER — Ambulatory Visit (INDEPENDENT_AMBULATORY_CARE_PROVIDER_SITE_OTHER): Payer: Medicare Other

## 2012-08-28 DIAGNOSIS — E291 Testicular hypofunction: Secondary | ICD-10-CM

## 2012-08-28 MED ORDER — TESTOSTERONE CYPIONATE 200 MG/ML IM SOLN
200.0000 mg | Freq: Once | INTRAMUSCULAR | Status: AC
  Administered 2012-08-28: 200 mg via INTRAMUSCULAR

## 2012-09-08 ENCOUNTER — Other Ambulatory Visit: Payer: Self-pay | Admitting: Internal Medicine

## 2012-09-09 ENCOUNTER — Other Ambulatory Visit: Payer: Self-pay | Admitting: Internal Medicine

## 2012-09-09 MED ORDER — TIZANIDINE HCL 4 MG PO TABS: 4.0000 mg | ORAL_TABLET | Freq: Three times a day (TID) | ORAL | Status: DC | PRN

## 2012-09-09 NOTE — Addendum Note (Signed)
Addended by: Merrilyn Puma on: 09/09/2012 03:07 PM   Modules accepted: Orders

## 2012-09-09 NOTE — Telephone Encounter (Signed)
Ok to Rf? 

## 2012-09-11 ENCOUNTER — Ambulatory Visit (INDEPENDENT_AMBULATORY_CARE_PROVIDER_SITE_OTHER): Payer: Medicare Other

## 2012-09-11 DIAGNOSIS — E291 Testicular hypofunction: Secondary | ICD-10-CM

## 2012-09-11 MED ORDER — TESTOSTERONE CYPIONATE 200 MG/ML IM SOLN
200.0000 mg | Freq: Once | INTRAMUSCULAR | Status: AC
  Administered 2012-09-11: 200 mg via INTRAMUSCULAR

## 2012-09-16 NOTE — Telephone Encounter (Signed)
Pt is also requesting Rx for updated Norvasc dosage - 5 mg QD. Pt says he was told to reduce medication at last OV, please advise

## 2012-09-17 MED ORDER — TRAMADOL HCL 50 MG PO TABS: 50.0000 mg | ORAL_TABLET | Freq: Two times a day (BID) | ORAL | Status: DC | PRN

## 2012-09-18 NOTE — Telephone Encounter (Signed)
Please advise on Norvasc Rx

## 2012-09-19 ENCOUNTER — Other Ambulatory Visit: Payer: Self-pay | Admitting: Internal Medicine

## 2012-09-20 NOTE — Telephone Encounter (Signed)
Ok to Rf? 

## 2012-09-25 ENCOUNTER — Ambulatory Visit (INDEPENDENT_AMBULATORY_CARE_PROVIDER_SITE_OTHER): Payer: Medicare Other | Admitting: *Deleted

## 2012-09-25 DIAGNOSIS — E291 Testicular hypofunction: Secondary | ICD-10-CM

## 2012-09-25 MED ORDER — TESTOSTERONE CYPIONATE 200 MG/ML IM SOLN
200.0000 mg | Freq: Once | INTRAMUSCULAR | Status: AC
  Administered 2012-09-25: 200 mg via INTRAMUSCULAR

## 2012-10-07 ENCOUNTER — Ambulatory Visit (INDEPENDENT_AMBULATORY_CARE_PROVIDER_SITE_OTHER): Payer: Medicare Other | Admitting: Internal Medicine

## 2012-10-07 ENCOUNTER — Other Ambulatory Visit (INDEPENDENT_AMBULATORY_CARE_PROVIDER_SITE_OTHER): Payer: Medicare Other

## 2012-10-07 ENCOUNTER — Encounter: Payer: Self-pay | Admitting: Internal Medicine

## 2012-10-07 VITALS — BP 110/60 | HR 80 | Temp 98.6°F | Resp 16 | Ht 75.0 in | Wt 294.0 lb

## 2012-10-07 DIAGNOSIS — Z136 Encounter for screening for cardiovascular disorders: Secondary | ICD-10-CM

## 2012-10-07 DIAGNOSIS — Z125 Encounter for screening for malignant neoplasm of prostate: Secondary | ICD-10-CM

## 2012-10-07 DIAGNOSIS — I10 Essential (primary) hypertension: Secondary | ICD-10-CM

## 2012-10-07 DIAGNOSIS — E119 Type 2 diabetes mellitus without complications: Secondary | ICD-10-CM

## 2012-10-07 DIAGNOSIS — E291 Testicular hypofunction: Secondary | ICD-10-CM

## 2012-10-07 DIAGNOSIS — Z0000 Encounter for general adult medical examination without abnormal findings: Secondary | ICD-10-CM

## 2012-10-07 DIAGNOSIS — F1020 Alcohol dependence, uncomplicated: Secondary | ICD-10-CM

## 2012-10-07 DIAGNOSIS — E538 Deficiency of other specified B group vitamins: Secondary | ICD-10-CM

## 2012-10-07 DIAGNOSIS — M545 Low back pain: Secondary | ICD-10-CM

## 2012-10-07 LAB — HEPATIC FUNCTION PANEL
ALT: 11 U/L (ref 0–53)
AST: 15 U/L (ref 0–37)
Albumin: 3.9 g/dL (ref 3.5–5.2)
Alkaline Phosphatase: 53 U/L (ref 39–117)
Total Protein: 7.3 g/dL (ref 6.0–8.3)

## 2012-10-07 LAB — URINALYSIS
Hgb urine dipstick: NEGATIVE
Leukocytes, UA: NEGATIVE
Specific Gravity, Urine: >=1.03 (ref 1.000–1.030)
Urine Glucose: NEGATIVE
Urobilinogen, UA: 0.2 (ref 0.0–1.0)

## 2012-10-07 LAB — PSA: PSA: 0.46 ng/mL (ref 0.10–4.00)

## 2012-10-07 LAB — BASIC METABOLIC PANEL
CO2: 26 mEq/L (ref 19–32)
Calcium: 9.1 mg/dL (ref 8.4–10.5)
GFR: 89.51 mL/min (ref >60.00)
Sodium: 141 mEq/L (ref 135–145)

## 2012-10-07 LAB — LIPID PANEL
Cholesterol: 182 mg/dL (ref 0–200)
LDL Cholesterol: 128 mg/dL — ABNORMAL HIGH (ref 0–99)
Total CHOL/HDL Ratio: 6
VLDL: 23.4 mg/dL (ref 0.0–40.0)

## 2012-10-07 MED ORDER — GLIMEPIRIDE 2 MG PO TABS: 2.0000 mg | ORAL_TABLET | Freq: Every day | ORAL | Status: DC

## 2012-10-07 MED ORDER — NORTRIPTYLINE HCL 25 MG PO CAPS: 25.0000 mg | ORAL_CAPSULE | Freq: Every day | ORAL | Status: DC

## 2012-10-07 MED ORDER — AMLODIPINE BESYLATE 5 MG PO TABS: 5.0000 mg | ORAL_TABLET | Freq: Every day | ORAL | Status: DC

## 2012-10-07 NOTE — Assessment & Plan Note (Signed)
We reduced Amaryl

## 2012-10-07 NOTE — Assessment & Plan Note (Signed)
Continue with current prescription therapy as reflected on the Med list. Better 

## 2012-10-07 NOTE — Assessment & Plan Note (Signed)
Continue with current prescription therapy as reflected on the Med list.  

## 2012-10-07 NOTE — Assessment & Plan Note (Signed)
Doing well 

## 2012-10-07 NOTE — Assessment & Plan Note (Signed)

## 2012-10-07 NOTE — Progress Notes (Signed)
Subjective:    HPI The patient is here for a wellness exam. The patient has been doing well overall without major physical or psychological issues going on lately.  The patient presents for a follow-up of  chronic hypertension, chronic dyslipidemia, type 2 diabetes controlled with medicines. He lost wt on diet; low CBGs at times- he reduced glimeperide to 1 qd  F/u palpitations episodes - rare or none    Wt Readings from Last 3 Encounters:  10/07/12 294 lb (133.358 kg)  05/29/12 291 lb (131.997 kg)  05/27/12 291 lb (131.997 kg)   BP Readings from Last 3 Encounters:  10/07/12 110/60  05/29/12 100/64  05/27/12 110/80     Review of Systems  Constitutional: Negative for appetite change, fatigue and unexpected weight change.  HENT: Negative for hearing loss, ear pain, nosebleeds, congestion, sore throat, sneezing, trouble swallowing and neck pain.   Eyes: Negative for itching and visual disturbance.  Respiratory: Negative for cough.   Cardiovascular: Negative for chest pain, palpitations and leg swelling.  Gastrointestinal: Negative for nausea, vomiting, diarrhea, blood in stool and abdominal distention.  Genitourinary: Negative for frequency and hematuria.  Musculoskeletal: Negative for back pain, joint swelling and gait problem.  Skin: Negative for rash and wound.  Neurological: Negative for dizziness, tremors, speech difficulty, weakness, light-headedness, numbness and headaches.  Psychiatric/Behavioral: Negative for suicidal ideas, sleep disturbance, dysphoric mood and agitation. The patient is not nervous/anxious and is not hyperactive.        Objective:   Physical Exam  Constitutional: He is oriented to person, place, and time. He appears well-developed. No distress.       Less obese   HENT:  Nose: Nose normal.  Mouth/Throat: Oropharynx is clear and moist. No oropharyngeal exudate.  Eyes: Conjunctivae normal are normal. Pupils are equal, round, and reactive to  light.  Neck: Normal range of motion. No JVD present. No thyromegaly present.  Cardiovascular: Normal rate, regular rhythm, normal heart sounds and intact distal pulses.  Exam reveals no gallop and no friction rub.   No murmur heard. Pulmonary/Chest: Effort normal and breath sounds normal. No respiratory distress. He has no wheezes. He has no rales. He exhibits no tenderness.  Abdominal: Soft. Bowel sounds are normal. He exhibits no distension and no mass. There is no tenderness. There is no rebound and no guarding.  Genitourinary: Rectum normal, prostate normal and penis normal. Guaiac negative stool. No penile tenderness.  Musculoskeletal: Normal range of motion. He exhibits no edema and no tenderness.  Lymphadenopathy:    He has no cervical adenopathy.  Neurological: He is alert and oriented to person, place, and time. He has normal reflexes. No cranial nerve deficit. He exhibits normal muscle tone. Coordination normal.  Skin: Skin is warm and dry. No rash noted. He is not diaphoretic.  Psychiatric: He has a normal mood and affect. His behavior is normal. Judgment and thought content normal.  [facility] Lab Results  Component Value Date   WBC 6.5 09/28/2011   HGB 15.9 09/28/2011   HCT 46.1 09/28/2011   PLT 201.0 09/28/2011   GLUCOSE 71 05/27/2012   CHOL 156 05/29/2011   TRIG 138.0 05/29/2011   HDL 29.70* 05/29/2011   LDLDIRECT 88.1 01/23/2011   LDLCALC 99 05/29/2011   ALT 14 05/29/2011   AST 18 05/29/2011   NA 141 05/27/2012   K 3.8 05/27/2012   CL 109 05/27/2012   CREATININE 1.1 05/27/2012   BUN 16 05/27/2012   CO2 23 05/27/2012   TSH  1.01 09/28/2011   PSA 0.30 01/29/2012   HGBA1C 5.1 05/27/2012   MICROALBUR 0.3 01/23/2011           Assessment & Plan:

## 2012-10-08 LAB — CBC WITH DIFFERENTIAL/PLATELET
Basophils Absolute: 0.2 10*3/uL — ABNORMAL HIGH (ref 0.0–0.1)
HCT: 51.4 % (ref 39.0–52.0)
Lymphs Abs: 1.9 10*3/uL (ref 0.7–4.0)
Monocytes Absolute: 0.7 10*3/uL (ref 0.1–1.0)
Monocytes Relative: 12.2 % — ABNORMAL HIGH (ref 3.0–12.0)
Neutrophils Relative %: 47.5 % (ref 43.0–77.0)
Platelets: 192 10*3/uL (ref 150.0–400.0)
RDW: 14 % (ref 11.5–14.6)

## 2012-10-08 LAB — HEMOGLOBIN A1C: Hgb A1c MFr Bld: 5.7 % (ref 4.6–6.5)

## 2012-10-08 LAB — VITAMIN B12: Vitamin B-12: 307 pg/mL (ref 211–911)

## 2012-10-09 ENCOUNTER — Encounter: Payer: Self-pay | Admitting: Internal Medicine

## 2012-10-10 ENCOUNTER — Ambulatory Visit (INDEPENDENT_AMBULATORY_CARE_PROVIDER_SITE_OTHER): Payer: Medicare Other | Admitting: *Deleted

## 2012-10-10 DIAGNOSIS — E291 Testicular hypofunction: Secondary | ICD-10-CM

## 2012-10-10 MED ORDER — TESTOSTERONE CYPIONATE 200 MG/ML IM SOLN
100.0000 mg | Freq: Once | INTRAMUSCULAR | Status: AC
  Administered 2012-10-10: 100 mg via INTRAMUSCULAR

## 2012-10-23 ENCOUNTER — Other Ambulatory Visit: Payer: Self-pay | Admitting: Internal Medicine

## 2012-10-23 ENCOUNTER — Ambulatory Visit: Payer: Medicare Other

## 2012-10-25 ENCOUNTER — Telehealth: Payer: Self-pay | Admitting: Internal Medicine

## 2012-10-25 NOTE — Telephone Encounter (Signed)
Noted. Thx.

## 2012-10-25 NOTE — Telephone Encounter (Signed)
Caller: Brian Mccullough/Patient; Phone: [phone]; Reason for Call: Patient wanted to let Dr.  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  They have a product that the patient would like to try, and the company will be calling to talk about this product.  Please let the patient know if this product would be something that Dr.  Posey Rea would approve for him to begin taking.  In order for the insurance company to pay for this, he would need a prescription.  Please contact the patient if you have any questions.  Thanks.

## 2012-11-20 ENCOUNTER — Other Ambulatory Visit: Payer: Self-pay | Admitting: Internal Medicine

## 2012-11-26 ENCOUNTER — Other Ambulatory Visit: Payer: Self-pay | Admitting: Internal Medicine

## 2012-12-22 ENCOUNTER — Other Ambulatory Visit: Payer: Self-pay | Admitting: Internal Medicine

## 2012-12-27 ENCOUNTER — Other Ambulatory Visit: Payer: Self-pay | Admitting: Internal Medicine

## 2013-01-03 ENCOUNTER — Telehealth: Payer: Self-pay | Admitting: Internal Medicine

## 2013-01-03 NOTE — Telephone Encounter (Signed)
Patient Information:  Caller Name: Ariv  Phone: [phone]  Patient: Brian Mccullough, Brian Mccullough  Gender: Male  DOB: June 04, 1951  Age: 62 Years  PCP: [name], Alex (Adults only)  Office Follow Up:  Does the office need to follow up with this patient?: Yes  Instructions For The Office: Please contact patient concerning Amaryl dosage. Episodes of low blood sugar  RN Note:  Please contact patient concerning Amaryl dosage.  Symptoms  Reason For Call & Symptoms: Patient states that Dr. Posey Rea changed his Amaryl 2mg  once a day instead of two due to drops in GBS. in 10/07/13.Marland Kitchen  He is taking Amaryl once a day.  His blood sugars have been running now in the low 60"s.  His total weight 80lb in last 18 months. voice is calm and clear. He states s/sx are  "queesy stomach and a slight blurry spell" .  Reviewed Health History In EMR: Yes  Reviewed Medications In EMR: Yes  Reviewed Allergies In EMR: Yes  Reviewed Surgeries / Procedures: Yes  Date of Onset of Symptoms: 12/20/2012  Guideline(s) Used:  Diabetes - Low Blood Sugar  Disposition Per Guideline:   Discuss with PCP and Callback by Nurse Today  Reason For Disposition Reached:   Morning (before breakfast) blood glucose < 80 mg/dl (4.5 mmol/L) and more than once in past week  Advice Given:  Call Back If:  There is no improvement within 30 minutes  Sleepiness or confusion occur  You become worse.  Treatment  Precose (acarbose):  ln this case low blood sugar must be treated with commercial glucose tablets. Take 3 glucose tablets now. If glucose tablets are not available, milk may work.  Special note: juice, candies and table sugar are not effective because of the mechanism of action of this diabetic pill.  Treatment  Orange juice (1/2 cup; 120 ml)  Table sugar or honey (3 teaspoons; 15 ml)  Reassurance  It sounds like an episode of low blood sugar (hypoglycemia) that we can treat at home.  Call Back If:  Morning blood glucose less  than 80 mg/dl (4.5 mmol/L) more than once in a week  You have more questions  You become worse.  Patient Will Follow Care Advice:  YES

## 2013-01-06 NOTE — Telephone Encounter (Signed)
Try Amaryl 1 mg daily (1/2 tab a day) Thx

## 2013-01-07 NOTE — Telephone Encounter (Signed)
Pt informed of MD's advisement. 

## 2013-01-23 ENCOUNTER — Other Ambulatory Visit (INDEPENDENT_AMBULATORY_CARE_PROVIDER_SITE_OTHER): Payer: Medicare Other

## 2013-01-23 ENCOUNTER — Ambulatory Visit (INDEPENDENT_AMBULATORY_CARE_PROVIDER_SITE_OTHER): Payer: Medicare Other | Admitting: Internal Medicine

## 2013-01-23 ENCOUNTER — Encounter: Payer: Self-pay | Admitting: Internal Medicine

## 2013-01-23 VITALS — BP 110/72 | HR 76 | Temp 98.1°F | Resp 16 | Wt 283.0 lb

## 2013-01-23 DIAGNOSIS — E291 Testicular hypofunction: Secondary | ICD-10-CM

## 2013-01-23 DIAGNOSIS — E119 Type 2 diabetes mellitus without complications: Secondary | ICD-10-CM

## 2013-01-23 DIAGNOSIS — E669 Obesity, unspecified: Secondary | ICD-10-CM

## 2013-01-23 DIAGNOSIS — I10 Essential (primary) hypertension: Secondary | ICD-10-CM

## 2013-01-23 DIAGNOSIS — E559 Vitamin D deficiency, unspecified: Secondary | ICD-10-CM

## 2013-01-23 LAB — CBC WITH DIFFERENTIAL/PLATELET
Basophils Relative: 0.4 % (ref 0.0–3.0)
Eosinophils Absolute: 0.2 10*3/uL (ref 0.0–0.7)
Eosinophils Relative: 3.8 % (ref 0.0–5.0)
HCT: 46.7 % (ref 39.0–52.0)
Hemoglobin: 15.9 g/dL (ref 13.0–17.0)
Lymphs Abs: 1.8 10*3/uL (ref 0.7–4.0)
MCHC: 34.1 g/dL (ref 30.0–36.0)
MCV: 89.8 fl (ref 78.0–100.0)
Monocytes Absolute: 0.7 10*3/uL (ref 0.1–1.0)
Neutro Abs: 3.2 10*3/uL (ref 1.4–7.7)
RBC: 5.2 Mil/uL (ref 4.22–5.81)

## 2013-01-23 LAB — BASIC METABOLIC PANEL
BUN: 12 mg/dL (ref 6–23)
Creatinine, Ser: 1 mg/dL (ref 0.4–1.5)
GFR: 94.45 mL/min (ref >60.00)
Glucose, Bld: 70 mg/dL (ref 70–99)

## 2013-01-23 LAB — HEMOGLOBIN A1C: Hgb A1c MFr Bld: 5.5 % (ref 4.6–6.5)

## 2013-01-23 MED ORDER — METFORMIN HCL 500 MG PO TABS: 500.0000 mg | ORAL_TABLET | Freq: Two times a day (BID) | ORAL | Status: DC

## 2013-01-23 MED ORDER — METFORMIN HCL 500 MG PO TABS: 500.0000 mg | ORAL_TABLET | Freq: Every day | ORAL | Status: DC

## 2013-01-23 NOTE — Assessment & Plan Note (Signed)
Continue with current prescription therapy as reflected on the Med list.  

## 2013-01-23 NOTE — Assessment & Plan Note (Signed)
Not on rx 

## 2013-01-23 NOTE — Assessment & Plan Note (Signed)
Change to metformin

## 2013-01-23 NOTE — Progress Notes (Signed)
Patient ID: Brian Mccullough, male   DOB: [birth year], 62 y.o.   MRN: [id number]   Subjective:    HPI The patient is here for a wellness exam. The patient has been doing well overall without major physical or psychological issues going on lately.  The patient presents for a follow-up of  chronic hypertension, chronic dyslipidemia, type 2 diabetes controlled with medicines. He lost wt on diet; low CBGs at times- he reduced glimeperide to 1 qd  F/u palpitations episodes - rare or none    Wt Readings from Last 3 Encounters:  01/23/13 283 lb (128.368 kg)  10/07/12 294 lb (133.358 kg)  05/29/12 291 lb (131.997 kg)   BP Readings from Last 3 Encounters:  01/23/13 110/72  10/07/12 110/60  05/29/12 100/64     Review of Systems  Constitutional: Negative for appetite change, fatigue and unexpected weight change.  HENT: Negative for hearing loss, ear pain, nosebleeds, congestion, sore throat, sneezing, trouble swallowing and neck pain.   Eyes: Negative for itching and visual disturbance.  Respiratory: Negative for cough.   Cardiovascular: Negative for chest pain, palpitations and leg swelling.  Gastrointestinal: Negative for nausea, vomiting, diarrhea, blood in stool and abdominal distention.  Genitourinary: Negative for frequency and hematuria.  Musculoskeletal: Negative for back pain, joint swelling and gait problem.  Skin: Negative for rash and wound.  Neurological: Negative for dizziness, tremors, speech difficulty, weakness, light-headedness, numbness and headaches.  Psychiatric/Behavioral: Negative for suicidal ideas, sleep disturbance, dysphoric mood and agitation. The patient is not nervous/anxious and is not hyperactive.        Objective:   Physical Exam  Constitutional: He is oriented to person, place, and time. He appears well-developed. No distress.  Less obese   HENT:  Nose: Nose normal.  Mouth/Throat: Oropharynx is clear and moist. No oropharyngeal exudate.  Eyes:  Conjunctivae are normal. Pupils are equal, round, and reactive to light.  Neck: Normal range of motion. No JVD present. No thyromegaly present.  Cardiovascular: Normal rate, regular rhythm, normal heart sounds and intact distal pulses.  Exam reveals no gallop and no friction rub.   No murmur heard. Pulmonary/Chest: Effort normal and breath sounds normal. No respiratory distress. He has no wheezes. He has no rales. He exhibits no tenderness.  Abdominal: Soft. Bowel sounds are normal. He exhibits no distension and no mass. There is no tenderness. There is no rebound and no guarding.  Genitourinary: Rectum normal, prostate normal and penis normal. Guaiac negative stool. No penile tenderness.  Musculoskeletal: Normal range of motion. He exhibits no edema and no tenderness.  Lymphadenopathy:    He has no cervical adenopathy.  Neurological: He is alert and oriented to person, place, and time. He has normal reflexes. No cranial nerve deficit. He exhibits normal muscle tone. Coordination normal.  Skin: Skin is warm and dry. No rash noted. He is not diaphoretic.  Psychiatric: He has a normal mood and affect. His behavior is normal. Judgment and thought content normal.  [facility] Results  Component Value Date   WBC 5.8 10/07/2012   HGB 17.1* 10/07/2012   HCT 51.4 10/07/2012   PLT 192.0 10/07/2012   GLUCOSE 63* 10/07/2012   CHOL 182 10/07/2012   TRIG 117.0 10/07/2012   HDL 30.20* 10/07/2012   LDLDIRECT 88.1 01/23/2011   LDLCALC 128* 10/07/2012   ALT 11 10/07/2012   AST 15 10/07/2012   NA 141 10/07/2012   K 4.2 10/07/2012   CL 107 10/07/2012   CREATININE 1.1 10/07/2012   BUN  13 10/07/2012   CO2 26 10/07/2012   TSH 0.92 10/07/2012   PSA 0.46 10/07/2012   HGBA1C 5.7 10/07/2012   MICROALBUR 0.3 01/23/2011           Assessment & Plan:

## 2013-01-23 NOTE — Assessment & Plan Note (Signed)
Much better Wt Readings from Last 3 Encounters:  01/23/13 283 lb (128.368 kg)  10/07/12 294 lb (133.358 kg)  05/29/12 291 lb (131.997 kg)

## 2013-02-05 ENCOUNTER — Ambulatory Visit: Payer: Medicare Other | Admitting: Internal Medicine

## 2013-02-25 ENCOUNTER — Other Ambulatory Visit: Payer: Self-pay | Admitting: Internal Medicine

## 2013-03-18 ENCOUNTER — Other Ambulatory Visit: Payer: Self-pay | Admitting: Internal Medicine

## 2013-03-26 ENCOUNTER — Other Ambulatory Visit: Payer: Self-pay | Admitting: Orthopedic Surgery

## 2013-03-26 ENCOUNTER — Ambulatory Visit
Admission: RE | Admit: 2013-03-26 | Discharge: 2013-03-26 | Disposition: A | Discharge status: Home / Self Care | Payer: Medicare Other | Source: Ambulatory Visit | Attending: Orthopedic Surgery | Admitting: Orthopedic Surgery

## 2013-03-26 DIAGNOSIS — M25462 Effusion, left knee: Secondary | ICD-10-CM

## 2013-03-27 ENCOUNTER — Ambulatory Visit (INDEPENDENT_AMBULATORY_CARE_PROVIDER_SITE_OTHER): Payer: Medicare Other | Admitting: Internal Medicine

## 2013-03-27 ENCOUNTER — Encounter: Payer: Self-pay | Admitting: Internal Medicine

## 2013-03-27 VITALS — BP 112/80 | HR 68 | Temp 97.9°F | Resp 16 | Wt 279.0 lb

## 2013-03-27 DIAGNOSIS — I10 Essential (primary) hypertension: Secondary | ICD-10-CM

## 2013-03-27 DIAGNOSIS — E559 Vitamin D deficiency, unspecified: Secondary | ICD-10-CM

## 2013-03-27 DIAGNOSIS — R1031 Right lower quadrant pain: Secondary | ICD-10-CM | POA: Insufficient documentation

## 2013-03-27 MED ORDER — IBUPROFEN 600 MG PO TABS: 600.0000 mg | ORAL_TABLET | Freq: Two times a day (BID) | ORAL | Status: DC | PRN

## 2013-03-27 NOTE — Progress Notes (Signed)
Subjective:    Groin Pain The patient's pertinent negatives include no genital injury. This is a new problem. The current episode started more than 1 month ago. The problem occurs intermittently. The problem has been unchanged. Pertinent negatives include no abdominal pain, chest pain, coughing, diarrhea, discolored urine, dysuria, fever, frequency, headaches, joint pain, joint swelling, nausea, rash, sore throat or vomiting. There is no history of an inguinal hernia.    The patient presents for a follow-up of  chronic hypertension, chronic dyslipidemia, type 2 diabetes controlled with medicines. He lost wt on diet; low CBGs at times- he reduced glimeperide to 1 qd  F/u palpitations episodes - rare or none    Wt Readings from Last 3 Encounters:  03/27/13 279 lb (126.554 kg)  01/23/13 283 lb (128.368 kg)  10/07/12 294 lb (133.358 kg)   BP Readings from Last 3 Encounters:  03/27/13 112/80  01/23/13 110/72  10/07/12 110/60     Review of Systems  Constitutional: Negative for fever, appetite change, fatigue and unexpected weight change.  HENT: Negative for hearing loss, ear pain, nosebleeds, congestion, sore throat, sneezing, trouble swallowing and neck pain.   Eyes: Negative for itching and visual disturbance.  Respiratory: Negative for cough.   Cardiovascular: Negative for chest pain, palpitations and leg swelling.  Gastrointestinal: Negative for nausea, vomiting, abdominal pain, diarrhea, blood in stool and abdominal distention.  Genitourinary: Negative for dysuria, frequency and hematuria.  Musculoskeletal: Negative for back pain, joint pain, joint swelling and gait problem.  Skin: Negative for rash and wound.  Neurological: Negative for dizziness, tremors, speech difficulty, weakness, light-headedness, numbness and headaches.  Psychiatric/Behavioral: Negative for suicidal ideas, sleep disturbance, dysphoric mood and agitation. The patient is not nervous/anxious and is not  hyperactive.        Objective:   Physical Exam  Constitutional: He is oriented to person, place, and time. He appears well-developed. No distress.  Less obese   HENT:  Nose: Nose normal.  Mouth/Throat: Oropharynx is clear and moist. No oropharyngeal exudate.  Eyes: Conjunctivae are normal. Pupils are equal, round, and reactive to light.  Neck: Normal range of motion. No JVD present. No thyromegaly present.  Cardiovascular: Normal rate, regular rhythm, normal heart sounds and intact distal pulses.  Exam reveals no gallop and no friction rub.   No murmur heard. Pulmonary/Chest: Effort normal and breath sounds normal. No respiratory distress. He has no wheezes. He has no rales. He exhibits no tenderness.  Abdominal: Soft. Bowel sounds are normal. He exhibits no distension and no mass. There is no tenderness. There is no rebound and no guarding.  Genitourinary: Rectum normal, prostate normal and penis normal. Guaiac negative stool. No penile tenderness.  B testes - nl NT  Musculoskeletal: Normal range of motion. He exhibits tenderness. He exhibits no edema.  No hernia R groin is a little sensitive  Lymphadenopathy:    He has no cervical adenopathy.  Neurological: He is alert and oriented to person, place, and time. He has normal reflexes. No cranial nerve deficit. He exhibits normal muscle tone. Coordination normal.  Skin: Skin is warm and dry. No rash noted. He is not diaphoretic.  Psychiatric: He has a normal mood and affect. His behavior is normal. Judgment and thought content normal.  [facility] Results  Component Value Date   WBC 5.9 01/23/2013   HGB 15.9 01/23/2013   HCT 46.7 01/23/2013   PLT 179.0 01/23/2013   GLUCOSE 70 01/23/2013   CHOL 182 10/07/2012   TRIG 117.0  10/07/2012   HDL 30.20* 10/07/2012   LDLDIRECT 88.1 01/23/2011   LDLCALC 128* 10/07/2012   ALT 11 10/07/2012   AST 15 10/07/2012   NA 140 01/23/2013   K 3.7 01/23/2013   CL 106 01/23/2013   CREATININE 1.0 01/23/2013    BUN 12 01/23/2013   CO2 26 01/23/2013   TSH 0.92 10/07/2012   PSA 0.46 10/07/2012   HGBA1C 5.5 01/23/2013   MICROALBUR 0.3 01/23/2011           Assessment & Plan:

## 2013-03-27 NOTE — Assessment & Plan Note (Addendum)
6/14 recurrent Bursitis(?) Ortho appt in 2 wk - he has it

## 2013-03-27 NOTE — Assessment & Plan Note (Signed)
Continue with current prescription therapy as reflected on the Med list.  

## 2013-04-21 NOTE — Assessment & Plan Note (Signed)
See meds 

## 2013-05-05 ENCOUNTER — Other Ambulatory Visit: Payer: Self-pay | Admitting: Internal Medicine

## 2013-05-12 ENCOUNTER — Other Ambulatory Visit: Payer: Self-pay | Admitting: Internal Medicine

## 2013-05-26 ENCOUNTER — Other Ambulatory Visit (INDEPENDENT_AMBULATORY_CARE_PROVIDER_SITE_OTHER): Payer: Medicare Other

## 2013-05-26 ENCOUNTER — Ambulatory Visit (INDEPENDENT_AMBULATORY_CARE_PROVIDER_SITE_OTHER): Payer: Medicare Other | Admitting: Internal Medicine

## 2013-05-26 ENCOUNTER — Encounter: Payer: Self-pay | Admitting: Internal Medicine

## 2013-05-26 VITALS — BP 100/70 | HR 72 | Temp 98.3°F | Resp 16 | Wt 283.0 lb

## 2013-05-26 DIAGNOSIS — I10 Essential (primary) hypertension: Secondary | ICD-10-CM

## 2013-05-26 DIAGNOSIS — E291 Testicular hypofunction: Secondary | ICD-10-CM

## 2013-05-26 DIAGNOSIS — E119 Type 2 diabetes mellitus without complications: Secondary | ICD-10-CM

## 2013-05-26 DIAGNOSIS — E669 Obesity, unspecified: Secondary | ICD-10-CM

## 2013-05-26 DIAGNOSIS — E538 Deficiency of other specified B group vitamins: Secondary | ICD-10-CM

## 2013-05-26 LAB — BASIC METABOLIC PANEL
BUN: 15 mg/dL (ref 6–23)
Chloride: 107 mEq/L (ref 96–112)
Glucose, Bld: 97 mg/dL (ref 70–99)
Potassium: 4 mEq/L (ref 3.5–5.1)

## 2013-05-26 LAB — HEMOGLOBIN A1C: Hgb A1c MFr Bld: 5.6 % (ref 4.6–6.5)

## 2013-05-26 MED ORDER — ETODOLAC 400 MG PO TABS: ORAL_TABLET | ORAL | Status: DC

## 2013-05-26 NOTE — Assessment & Plan Note (Signed)
Wt Readings from Last 3 Encounters:  05/26/13 283 lb (128.368 kg)  03/27/13 279 lb (126.554 kg)  01/23/13 283 lb (128.368 kg)

## 2013-05-26 NOTE — Progress Notes (Signed)
Subjective:    HPI  R groin pain has resolved w/stretching  The patient presents for a follow-up of  chronic hypertension, chronic dyslipidemia, type 2 diabetes controlled with medicines. He lost wt on diet; low CBGs at times- he reduced glimeperide to 1 qd  F/u palpitations episodes - rare or none    Wt Readings from Last 3 Encounters:  05/26/13 283 lb (128.368 kg)  03/27/13 279 lb (126.554 kg)  01/23/13 283 lb (128.368 kg)   BP Readings from Last 3 Encounters:  05/26/13 100/70  03/27/13 112/80  01/23/13 110/72     Review of Systems  Constitutional: Negative for appetite change, fatigue and unexpected weight change.  HENT: Negative for hearing loss, ear pain, nosebleeds, congestion, sore throat, sneezing, trouble swallowing and neck pain.   Eyes: Negative for itching and visual disturbance.  Respiratory: Negative for cough.   Cardiovascular: Negative for chest pain, palpitations and leg swelling.  Gastrointestinal: Negative for nausea, vomiting, diarrhea, blood in stool and abdominal distention.  Genitourinary: Negative for frequency and hematuria.  Musculoskeletal: Negative for back pain, joint swelling and gait problem.  Skin: Negative for rash and wound.  Neurological: Negative for dizziness, tremors, speech difficulty, weakness, light-headedness, numbness and headaches.  Psychiatric/Behavioral: Negative for suicidal ideas, sleep disturbance, dysphoric mood and agitation. The patient is not nervous/anxious and is not hyperactive.        Objective:   Physical Exam  Constitutional: He is oriented to person, place, and time. He appears well-developed. No distress.  Less obese   HENT:  Nose: Nose normal.  Mouth/Throat: Oropharynx is clear and moist. No oropharyngeal exudate.  Eyes: Conjunctivae are normal. Pupils are equal, round, and reactive to light.  Neck: Normal range of motion. No JVD present. No thyromegaly present.  Cardiovascular: Normal rate, regular  rhythm, normal heart sounds and intact distal pulses.  Exam reveals no gallop and no friction rub.   No murmur heard. Pulmonary/Chest: Effort normal and breath sounds normal. No respiratory distress. He has no wheezes. He has no rales. He exhibits no tenderness.  Abdominal: Soft. Bowel sounds are normal. He exhibits no distension and no mass. There is no tenderness. There is no rebound and no guarding.  Genitourinary: Rectum normal, prostate normal and penis normal. Guaiac negative stool. No penile tenderness.  Musculoskeletal: Normal range of motion. He exhibits no edema and no tenderness.  Lymphadenopathy:    He has no cervical adenopathy.  Neurological: He is alert and oriented to person, place, and time. He has normal reflexes. No cranial nerve deficit. He exhibits normal muscle tone. Coordination normal.  Skin: Skin is warm and dry. No rash noted. He is not diaphoretic.  Psychiatric: He has a normal mood and affect. His behavior is normal. Judgment and thought content normal.  [facility] Results  Component Value Date   WBC 5.9 01/23/2013   HGB 15.9 01/23/2013   HCT 46.7 01/23/2013   PLT 179.0 01/23/2013   GLUCOSE 70 01/23/2013   CHOL 182 10/07/2012   TRIG 117.0 10/07/2012   HDL 30.20* 10/07/2012   LDLDIRECT 88.1 01/23/2011   LDLCALC 128* 10/07/2012   ALT 11 10/07/2012   AST 15 10/07/2012   NA 140 01/23/2013   K 3.7 01/23/2013   CL 106 01/23/2013   CREATININE 1.0 01/23/2013   BUN 12 01/23/2013   CO2 26 01/23/2013   TSH 0.92 10/07/2012   PSA 0.46 10/07/2012   HGBA1C 5.5 01/23/2013   MICROALBUR 0.3 01/23/2011  Assessment & Plan:

## 2013-05-26 NOTE — Assessment & Plan Note (Signed)
Continue with current prescription therapy as reflected on the Med list.  

## 2013-05-26 NOTE — Assessment & Plan Note (Signed)
Continue with current prescription therapy as reflected on the Med list. Labs  

## 2013-06-18 ENCOUNTER — Telehealth: Payer: Self-pay | Admitting: Internal Medicine

## 2013-06-18 NOTE — Telephone Encounter (Signed)
Pt scheduled appoint 9.11.14 @ 2:45

## 2013-06-18 NOTE — Telephone Encounter (Signed)
Patient Information:  Caller Name: Bao  Phone: [phone]  Patient: ARION, SHANKLES  Gender: Male  DOB: [birth year]  Age: 62 Years  PCP: [name], Alex (Adults only)  Office Follow Up:  Does the office need to follow up with this patient?: Yes- No appointments available  Instructions For The Office: Can he be working in or should he go to UC?   Symptoms  Reason For Call & Symptoms: Had palpatations for an hour today @ 12:30- 30 min after taking long one hour walk. Last week had cold virus for a couple days. When it happened he sat down after feeling whoozy. He started to feel sweaty and was trying to take slow deep breaths. Feels fine for past hour. Has not had any episodes for past 3 - 4 months.  Reviewed Health History In EMR: Yes  Reviewed Medications In EMR: Yes  Reviewed Allergies In EMR: Yes  Reviewed Surgeries / Procedures: Yes  Date of Onset of Symptoms: 06/18/2013  Guideline(s) Used:  Heart Rate and Heartbeat Questions  Disposition Per Guideline:   See Today in Office  Reason For Disposition Reached:   Taking water pill (i.e., diuretic) or heart medication (e.g., digoxin)  Advice Given:  Avoid Caffeine  Avoid caffeine-containing beverages (Reason: caffeine is a stimulant and can aggravate palpitations).  Examples of caffeine-containing beverages include coffee, tea, colas, [address], [employer], and some energy drinks.  Reassurance  Everybody has palpitations at some point in their lives. Sometimes it is simply a heightened awareness of the heart's normal beating.  Occasional extra heart beats are experienced by most everyone. Lack of sleep, stress, and caffeinated beverages can make palpitations worse.  Patients with anxiety or stress may describe a "rapid heartbeat" or "pounding" in their chest from their heart beating.  Avoid Diet Pills  : Do not use diet pills (Reason: they act as stimulants).  Limit Alcohol:  Limit your alcohol consumption to no more  than 2 drinks a day. Ideally, eliminate alcohol entirely for the next 2 weeks.  Limit Smoking:   Stop or reduce your smoking.  Expected Course  : If your symptoms do not improve over the next couple days then you should make an appointment to see your doctor.  Call Back If:  Chest pain, lightheadedness, or difficulty breathing occurs  Heart beating more than 130 beats / minute  More than 3 extra or skipped beats / minute  You become worse.  Patient Will Follow Care Advice:  YES

## 2013-06-19 ENCOUNTER — Ambulatory Visit (INDEPENDENT_AMBULATORY_CARE_PROVIDER_SITE_OTHER): Payer: Medicare Other | Admitting: Internal Medicine

## 2013-06-19 ENCOUNTER — Encounter: Payer: Self-pay | Admitting: Internal Medicine

## 2013-06-19 ENCOUNTER — Other Ambulatory Visit (INDEPENDENT_AMBULATORY_CARE_PROVIDER_SITE_OTHER): Payer: Medicare Other

## 2013-06-19 VITALS — BP 120/80 | HR 76 | Temp 98.5°F | Resp 16 | Wt 285.0 lb

## 2013-06-19 DIAGNOSIS — Z23 Encounter for immunization: Secondary | ICD-10-CM

## 2013-06-19 DIAGNOSIS — E538 Deficiency of other specified B group vitamins: Secondary | ICD-10-CM

## 2013-06-19 DIAGNOSIS — R002 Palpitations: Secondary | ICD-10-CM

## 2013-06-19 DIAGNOSIS — E119 Type 2 diabetes mellitus without complications: Secondary | ICD-10-CM

## 2013-06-19 DIAGNOSIS — I10 Essential (primary) hypertension: Secondary | ICD-10-CM

## 2013-06-19 LAB — BASIC METABOLIC PANEL
BUN: 13 mg/dL (ref 6–23)
CO2: 24 mEq/L (ref 19–32)
Calcium: 9.4 mg/dL (ref 8.4–10.5)
Creatinine, Ser: 1.1 mg/dL (ref 0.4–1.5)

## 2013-06-19 MED ORDER — METOPROLOL TARTRATE 25 MG PO TABS: 25.0000 mg | ORAL_TABLET | Freq: Two times a day (BID) | ORAL | Status: DC | PRN

## 2013-06-19 NOTE — Assessment & Plan Note (Signed)
Continue with current prescription therapy as reflected on the Med list.  

## 2013-06-19 NOTE — Progress Notes (Signed)
Subjective:    HPI  C/o palpitations yesterday after an hour and 15 min walk. Palpitations started at home. It stopped after 45 min. A little lightheaded then, no CP.  The patient presents for a follow-up of  chronic hypertension, chronic dyslipidemia, type 2 diabetes controlled with medicines. He lost wt on diet; low CBGs at times- he reduced glimeperide to 1 qd  H/o palpitations episodes - rare or none    Wt Readings from Last 3 Encounters:  06/19/13 285 lb (129.275 kg)  05/26/13 283 lb (128.368 kg)  03/27/13 279 lb (126.554 kg)   BP Readings from Last 3 Encounters:  06/19/13 120/80  05/26/13 100/70  03/27/13 112/80     Review of Systems  Constitutional: Negative for appetite change, fatigue and unexpected weight change.  HENT: Negative for hearing loss, ear pain, nosebleeds, congestion, sore throat, sneezing, trouble swallowing and neck pain.   Eyes: Negative for itching and visual disturbance.  Respiratory: Negative for cough.   Cardiovascular: Negative for chest pain, palpitations and leg swelling.  Gastrointestinal: Negative for nausea, vomiting, diarrhea, blood in stool and abdominal distention.  Genitourinary: Negative for frequency and hematuria.  Musculoskeletal: Negative for back pain, joint swelling and gait problem.  Skin: Negative for rash and wound.  Neurological: Negative for dizziness, tremors, speech difficulty, weakness, light-headedness, numbness and headaches.  Psychiatric/Behavioral: Negative for suicidal ideas, sleep disturbance, dysphoric mood and agitation. The patient is not nervous/anxious and is not hyperactive.        Objective:   Physical Exam  Constitutional: He is oriented to person, place, and time. He appears well-developed. No distress.  Less obese   HENT:  Nose: Nose normal.  Mouth/Throat: Oropharynx is clear and moist. No oropharyngeal exudate.  Eyes: Conjunctivae are normal. Pupils are equal, round, and reactive to light.   Neck: Normal range of motion. No JVD present. No thyromegaly present.  Cardiovascular: Normal rate, regular rhythm, normal heart sounds and intact distal pulses.  Exam reveals no gallop and no friction rub.   No murmur heard. Pulmonary/Chest: Effort normal and breath sounds normal. No respiratory distress. He has no wheezes. He has no rales. He exhibits no tenderness.  Abdominal: Soft. Bowel sounds are normal. He exhibits no distension and no mass. There is no tenderness. There is no rebound and no guarding.  Genitourinary: Rectum normal, prostate normal and penis normal. Guaiac negative stool. No penile tenderness.  Musculoskeletal: Normal range of motion. He exhibits no edema and no tenderness.  Lymphadenopathy:    He has no cervical adenopathy.  Neurological: He is alert and oriented to person, place, and time. He has normal reflexes. No cranial nerve deficit. He exhibits normal muscle tone. Coordination normal.  Skin: Skin is warm and dry. No rash noted. He is not diaphoretic.  Psychiatric: He has a normal mood and affect. His behavior is normal. Judgment and thought content normal.  [facility] Results  Component Value Date   WBC 5.9 01/23/2013   HGB 15.9 01/23/2013   HCT 46.7 01/23/2013   PLT 179.0 01/23/2013   GLUCOSE 97 05/26/2013   CHOL 182 10/07/2012   TRIG 117.0 10/07/2012   HDL 30.20* 10/07/2012   LDLDIRECT 88.1 01/23/2011   LDLCALC 128* 10/07/2012   ALT 11 10/07/2012   AST 15 10/07/2012   NA 139 05/26/2013   K 4.0 05/26/2013   CL 107 05/26/2013   CREATININE 1.2 05/26/2013   BUN 15 05/26/2013   CO2 24 05/26/2013   TSH 0.92 10/07/2012  PSA 0.46 10/07/2012   HGBA1C 5.6 05/26/2013   MICROALBUR 0.3 01/23/2011           Assessment & Plan:

## 2013-06-19 NOTE — Assessment & Plan Note (Signed)
9/14 recurrent - likely SVT EKG Card ref Toprol prn Decaf coffee

## 2013-06-23 ENCOUNTER — Other Ambulatory Visit: Payer: Self-pay | Admitting: Internal Medicine

## 2013-06-23 ENCOUNTER — Ambulatory Visit (INDEPENDENT_AMBULATORY_CARE_PROVIDER_SITE_OTHER): Payer: Medicare Other | Admitting: Cardiovascular Disease

## 2013-06-23 ENCOUNTER — Encounter: Payer: Self-pay | Admitting: Cardiovascular Disease

## 2013-06-23 VITALS — BP 112/60 | HR 64 | Ht 75.0 in | Wt 288.0 lb

## 2013-06-23 DIAGNOSIS — E785 Hyperlipidemia, unspecified: Secondary | ICD-10-CM

## 2013-06-23 DIAGNOSIS — R0602 Shortness of breath: Secondary | ICD-10-CM

## 2013-06-23 DIAGNOSIS — I10 Essential (primary) hypertension: Secondary | ICD-10-CM

## 2013-06-23 DIAGNOSIS — E119 Type 2 diabetes mellitus without complications: Secondary | ICD-10-CM

## 2013-06-23 DIAGNOSIS — R002 Palpitations: Secondary | ICD-10-CM

## 2013-06-23 NOTE — Assessment & Plan Note (Signed)
Cholesterol is at goal.  Continue current dose of statin and diet Rx.  No myalgias or side effects.  F/U  LFT's in 6 months. Lab Results  Component Value Date   LDLCALC 128* 10/07/2012

## 2013-06-23 NOTE — Addendum Note (Signed)
Addended by: Scherrie Bateman E on: 06/23/2013 10:38 AM   Modules accepted: Orders

## 2013-06-23 NOTE — Assessment & Plan Note (Signed)
Well controlled.  Continue current medications and low sodium Dash type diet.    

## 2013-06-23 NOTE — Assessment & Plan Note (Signed)
Benign sounding  ETT to r/o CAD and exercise induced VT  Especially since they seem to occur post exercise  Echo to r/o structural heart disease

## 2013-06-23 NOTE — Progress Notes (Signed)
Patient ID: Brian Mccullough, male   DOB: 11-12-1950, 62 y.o.   MRN: [id number] 62 yo referred by Dr Paulette Blanch for palpitations.  History of HTN, elevated lipids and DM. Has had palpitations for years.  They usually only last a minute or two.  Earlier in month had an episode after walking for 1 hours and 20 mintues that lasted for an hour or so.  Flip flops not persistant tachycardia. No presyncope chest pain or dyspnea.  No excess cafeine or ETOH.  Dr Paulette Blanch gave him PRN lopresser. Has had a couple episodes since but they go away before he needs to take medicine.  No history of CAD but multiple CRF;s  Episodes not ppt by stress.  Frequently post exercise but not always.  Has lost about 100lbs over the last year with diet and exercise  ROS: Denies fever, malais, weight loss, blurry vision, decreased visual acuity, cough, sputum, SOB, hemoptysis, pleuritic pain, palpitaitons, heartburn, abdominal pain, melena, lower extremity edema, claudication, or rash.  All other systems reviewed and negative   General: Affect appropriate Healthy:  appears stated age HEENT: normal Neck supple with no adenopathy JVP normal no bruits no thyromegaly Lungs clear with no wheezing and good diaphragmatic motion Heart:  S1/S2 no murmur,rub, gallop or click PMI normal Abdomen: benighn, BS positve, no tenderness, no AAA no bruit.  No HSM or HJR Distal pulses intact with no bruits No edema Neuro non-focal Skin warm and dry No muscular weakness  Medications Current Outpatient Prescriptions  Medication Sig Dispense Refill  . amLODipine (NORVASC) 5 MG tablet Take 1 tablet (5 mg total) by mouth daily.  90 tablet  3  . aspirin 81 MG EC tablet Take 81 mg by mouth daily.        . benazepril (LOTENSIN) 40 MG tablet TAKE 1 TABLET (40 MG TOTAL) BY MOUTH DAILY.  90 tablet  1  . Cholecalciferol (VITAMIN D3) 1000 UNITS tablet Take 1,000 Units by mouth daily.        . clotrimazole-betamethasone (LOTRISONE) cream APPLY  TOPICALLY 2 (TWO) TIMES DAILY.  135 g  0  . cyanocobalamin 100 MCG tablet Take 100 mcg by mouth daily.        Marland Kitchen etodolac (LODINE) 400 MG tablet TAKE 1 TABLET (400 MG TOTAL) BY MOUTH 2 (TWO) TIMES DAILY.  180 tablet  2  . furosemide (LASIX) 40 MG tablet Take 40 mg by mouth every other day.      . ibuprofen (ADVIL,MOTRIN) 600 MG tablet Take 1 tablet (600 mg total) by mouth 2 (two) times daily as needed for pain.  60 tablet  1  . KLOR-CON M20 20 MEQ tablet TAKE 1 TABLET BY MOUTH DAILY  30 tablet  5  . metFORMIN (GLUCOPHAGE) 500 MG tablet Take 1 tablet (500 mg total) by mouth 2 (two) times daily with a meal.  180 tablet  3  . metoprolol tartrate (LOPRESSOR) 25 MG tablet Take 1-2 tablets (25-50 mg total) by mouth 2 (two) times daily as needed (palpitations).  60 tablet  3  . nortriptyline (PAMELOR) 25 MG capsule Take 1-2 capsules (25-50 mg total) by mouth at bedtime.  180 capsule  3  . omega-3 acid ethyl esters (LOVAZA) 1 G capsule Take 1 g by mouth daily.      . potassium chloride SA (KLOR-CON M20) 20 MEQ tablet Take 1 tablet (20 mEq total) by mouth daily.  90 tablet  2  . sildenafil (VIAGRA) 100 MG tablet Take 100 mg by mouth  as needed.      . tadalafil (CIALIS) 5 MG tablet Take 5 mg by mouth daily as needed.      . testosterone cypionate (DEPOTESTOTERONE CYPIONATE) 200 MG/ML injection Inject 200 mg into the muscle every 14 (fourteen) days.      Marland Kitchen tiZANidine (ZANAFLEX) 4 MG tablet TAKE 1 TABLET (4 MG TOTAL) BY MOUTH EVERY 8 (EIGHT) HOURS AS NEEDED.  90 tablet  2  . traMADol (ULTRAM) 50 MG tablet TAKE 1 TO 2 TABLETS BY MOUTH TWICE A DAY AS NEEDED FOR PAIN  100 tablet  3  . triamcinolone (KENALOG) 0.1 % ointment Apply 1 application topically 2 (two) times daily at 10 AM and 5 PM.         Current Facility-Administered Medications  Medication Dose Route Frequency Provider Last Rate Last Dose  . testosterone cypionate (DEPOTESTOTERONE CYPIONATE) injection 200 mg  200 mg Intramuscular Q14 Days Tresa Garter, MD        Allergies Codeine and Viagra  Family History: Family History  Problem Relation Age of Onset  . Hypertension Other   . Hypertension Mother   . Diabetes Mother   . Diabetes Father     Social History: History   Social History  . Marital Status: Married    Spouse Name: N/A    Number of Children: N/A  . Years of Education: N/A   Occupational History  . diabled     due to knees   Social History Main Topics  . Smoking status: Former Games developer  . Smokeless tobacco: Never Used  . Alcohol Use: No     Comment: It was up to 2 gallon of Vodka/week.  8 wks. off ETOH since 7/9  . Drug Use: No  . Sexual Activity: Yes   Other Topics Concern  . Not on file   Social History Narrative  . No narrative on file    Electrocardiogram:  9/11 SR rate 61 T wave inversion lead 3    Assessment and Plan

## 2013-06-23 NOTE — Patient Instructions (Signed)

## 2013-06-23 NOTE — Assessment & Plan Note (Signed)
Discussed low carb diet.  Target hemoglobin A1c is 6.5 or less.  Continue current medications.  

## 2013-06-27 ENCOUNTER — Ambulatory Visit (HOSPITAL_COMMUNITY): Payer: Medicare Other | Attending: Cardiology

## 2013-06-27 DIAGNOSIS — R002 Palpitations: Secondary | ICD-10-CM

## 2013-06-27 DIAGNOSIS — I079 Rheumatic tricuspid valve disease, unspecified: Secondary | ICD-10-CM | POA: Insufficient documentation

## 2013-06-27 DIAGNOSIS — I059 Rheumatic mitral valve disease, unspecified: Secondary | ICD-10-CM | POA: Insufficient documentation

## 2013-06-27 DIAGNOSIS — I379 Nonrheumatic pulmonary valve disorder, unspecified: Secondary | ICD-10-CM | POA: Insufficient documentation

## 2013-06-27 DIAGNOSIS — R0602 Shortness of breath: Secondary | ICD-10-CM

## 2013-06-27 NOTE — Progress Notes (Signed)
Echocardiogram performed by Matt LeBeau  

## 2013-07-21 ENCOUNTER — Ambulatory Visit (INDEPENDENT_AMBULATORY_CARE_PROVIDER_SITE_OTHER): Payer: Medicare Other | Admitting: Physician Assistant

## 2013-07-21 ENCOUNTER — Other Ambulatory Visit: Payer: Self-pay | Admitting: *Deleted

## 2013-07-21 DIAGNOSIS — R0602 Shortness of breath: Secondary | ICD-10-CM

## 2013-07-21 DIAGNOSIS — R002 Palpitations: Secondary | ICD-10-CM

## 2013-07-21 DIAGNOSIS — R9439 Abnormal result of other cardiovascular function study: Secondary | ICD-10-CM

## 2013-07-21 DIAGNOSIS — E119 Type 2 diabetes mellitus without complications: Secondary | ICD-10-CM

## 2013-07-21 NOTE — Progress Notes (Signed)
Exercise Treadmill Test  Pre-Exercise Testing Evaluation Rhythm: normal sinus  Rate: 67     Test  Exercise Tolerance Test Ordering MD: Charlton Haws, MD  Interpreting MD: Tereso Newcomer, PA-C  Unique Test No: 1  Treadmill:  1  Indication for ETT: SOB, Palpitations, Diabetes  Contraindication to ETT: No   Stress Modality: exercise - treadmill  Cardiac Imaging Performed: non   Protocol: standard Bruce - maximal  Max BP:  177/66  Max MPHR (bpm):  159 85% MPR (bpm):  135  MPHR obtained (bpm):  148 % MPHR obtained:  93  Reached 85% MPHR (min:sec):  3;27 Total Exercise Time (min-sec):  5:15  Workload in METS:  7.0 Borg Scale: 13  Reason ETT Terminated:  desired heart rate attained    ST Segment Analysis At Rest: normal ST segments - no evidence of significant ST depression With Exercise: significant ischemic ST depression  Other Information Arrhythmia:  No Angina during ETT:  absent (0) Quality of ETT:  diagnostic  ETT Interpretation:  abnormal - evidence of ST depression consistent with ischemia  Comments: Fair exercise capacity. No chest pain. Normal BP response to exercise. There was 1-2 mm of ST depression at peak exercise that improved quickly in recovery.  There was some residual ST flattening in recovery.  No exercise induced arrhythmias.   Recommendations: Patient had no symptoms c/w angina. However, there were significant ST changes at peak exercise that improved quickly in recovery. I will schedule a Lexiscan Myoview. Signed,  Tereso Newcomer, PA-C   07/21/2013 11:27 AM

## 2013-08-13 ENCOUNTER — Ambulatory Visit (HOSPITAL_COMMUNITY): Payer: Medicare Other | Attending: Cardiovascular Disease | Admitting: Radiology

## 2013-08-13 VITALS — BP 102/63 | Ht 75.0 in | Wt 290.0 lb

## 2013-08-13 DIAGNOSIS — I491 Atrial premature depolarization: Secondary | ICD-10-CM

## 2013-08-13 DIAGNOSIS — R002 Palpitations: Secondary | ICD-10-CM | POA: Insufficient documentation

## 2013-08-13 DIAGNOSIS — Z8249 Family history of ischemic heart disease and other diseases of the circulatory system: Secondary | ICD-10-CM | POA: Insufficient documentation

## 2013-08-13 DIAGNOSIS — R0789 Other chest pain: Secondary | ICD-10-CM

## 2013-08-13 DIAGNOSIS — R9439 Abnormal result of other cardiovascular function study: Secondary | ICD-10-CM

## 2013-08-13 DIAGNOSIS — E119 Type 2 diabetes mellitus without complications: Secondary | ICD-10-CM | POA: Insufficient documentation

## 2013-08-13 DIAGNOSIS — R079 Chest pain, unspecified: Secondary | ICD-10-CM

## 2013-08-13 DIAGNOSIS — R61 Generalized hyperhidrosis: Secondary | ICD-10-CM | POA: Insufficient documentation

## 2013-08-13 DIAGNOSIS — E785 Hyperlipidemia, unspecified: Secondary | ICD-10-CM | POA: Insufficient documentation

## 2013-08-13 DIAGNOSIS — R0602 Shortness of breath: Secondary | ICD-10-CM

## 2013-08-13 DIAGNOSIS — Z87891 Personal history of nicotine dependence: Secondary | ICD-10-CM | POA: Insufficient documentation

## 2013-08-13 DIAGNOSIS — R9431 Abnormal electrocardiogram [ECG] [EKG]: Secondary | ICD-10-CM | POA: Insufficient documentation

## 2013-08-13 MED ORDER — TECHNETIUM TC 99M SESTAMIBI GENERIC - CARDIOLITE
30.0000 | Freq: Once | INTRAVENOUS | Status: AC | PRN
  Administered 2013-08-13: 30 via INTRAVENOUS

## 2013-08-13 MED ORDER — REGADENOSON 0.4 MG/5ML IV SOLN
0.4000 mg | Freq: Once | INTRAVENOUS | Status: AC
  Administered 2013-08-13: 0.4 mg via INTRAVENOUS

## 2013-08-13 NOTE — Progress Notes (Signed)
[facility] SITE 3 NUCLEAR MED [address] [place], [place] [address] [phone]    Cardiology Nuclear Med Study  Brian Mccullough is a 62 y.o. male     MRN : [id number]     DOB: [birth year]  Procedure Date: 08/13/2013  Nuclear Med Background Indication for Stress Test:  Evaluation for Ischemia and Abnormal EKG History:  '14 ECHO: EF: 60-65% Cardiac Risk Factors: Family History - CAD, History of Smoking, Lipids and NIDDM  Symptoms:  Chest Pressure.  (last date of chest discomfort 05/24/13-05/25/13), Diaphoresis, Palpitations and SOB   Nuclear Pre-Procedure Caffeine/Decaff Intake:  None NPO After: 8:30pm   Lungs:  clear O2 Sat: 96% on room air. IV 0.9% NS with Angio Cath:  20g  IV Site: R Hand  IV Started by:  Milana Na, EMT-P  Chest Size (in):  52 Cup Size: n/a  Height: 6\' 3"  (1.905 m)  Weight:  290 lb (131.543 kg)  BMI:  Body mass index is 36.25 kg/(m^2). Tech Comments:  Rx with water    Nuclear Med Study 1 or 2 day study: 2 day  Stress Test Type:  Eugenie Birks  Reading MD: Tobias Alexander, MD  Order Authorizing Provider:  P.Nishan MD  Resting Radionuclide: Technetium 31m Sestamibi  Resting Radionuclide Dose: 33.0 mCi on 08/20/13   Stress Radionuclide:  Technetium 76m Sestamibi  Stress Radionuclide Dose: 33.0 mCi on 08/13/13          Stress Protocol Rest HR: 57 Stress HR: 78  Rest BP: 102/63 Stress BP: 108/65  Exercise Time (min): n/a METS: n/a   Predicted Max HR: 159 bpm % Max HR: 49.06 bpm Rate Pressure Product: 8424   Dose of Adenosine (mg):  n/a Dose of Lexiscan: 0.4 mg  Dose of Atropine (mg): n/a Dose of Dobutamine: n/a mcg/kg/min (at max HR)  Stress Test Technologist: Milana Na, EMT-P  Nuclear Technologist:  Domenic Polite, CNMT     Rest Procedure:  Myocardial perfusion imaging was performed at rest 45 minutes following the intravenous administration of Technetium 3m Sestamibi. Rest ECG: NSR - Normal EKG  Stress Procedure:  The  patient received IV Lexiscan 0.4 mg over 15-seconds.  Technetium 37m Sestamibi injected at 30-seconds. This patient had sob and chest discomfort with the Lexiscan injection. Quantitative spect images were obtained after a 45 minute delay. Stress ECG: No significant change from baseline ECG  QPS Raw Data Images:  Mild diaphragmatic attenuation.  Normal left ventricular size. Motion noted.  Stress Images:  There is decreased uptake in the basal inferior, basal and mid inferolateral wall.  Rest Images:  There is decreased uptake in the basal inferior, basal and mid inferolateral wall.  Subtraction (SDS):  No evidence of ischemia. Transient Ischemic Dilatation (Normal <1.22):  1.00 Lung/Heart Ratio (Normal <0.45):  0.28  Quantitative Gated Spect Images QGS EDV:  155 ml QGS ESV:  70 ml  Impression Exercise Capacity:  Lexiscan with no exercise. BP Response:  Normal blood pressure response. Clinical Symptoms:  Mild chest pain/dyspnea. ECG Impression:  No significant ST segment change suggestive of ischemia. Comparison with Prior Nuclear Study: No images to compare  Overall Impression:  Normal study with significant diaphragmatic attenuation. No ischemia.   LV Ejection Fraction: 55%.  LV Wall Motion:  NL LV Function; NL Wall Motion  Tobias Alexander, Rexene Edison 08/20/2013

## 2013-08-20 ENCOUNTER — Encounter: Payer: Self-pay | Admitting: Cardiology

## 2013-08-20 ENCOUNTER — Ambulatory Visit (HOSPITAL_COMMUNITY): Payer: Medicare Other | Attending: Cardiology

## 2013-08-20 DIAGNOSIS — R0789 Other chest pain: Secondary | ICD-10-CM

## 2013-08-20 DIAGNOSIS — R0989 Other specified symptoms and signs involving the circulatory and respiratory systems: Secondary | ICD-10-CM

## 2013-08-20 MED ORDER — TECHNETIUM TC 99M SESTAMIBI GENERIC - CARDIOLITE
33.0000 | Freq: Once | INTRAVENOUS | Status: AC | PRN
  Administered 2013-08-20: 33 via INTRAVENOUS

## 2013-08-22 ENCOUNTER — Encounter: Payer: Self-pay | Admitting: Physician Assistant

## 2013-08-22 ENCOUNTER — Other Ambulatory Visit: Payer: Self-pay | Admitting: Internal Medicine

## 2013-09-01 ENCOUNTER — Other Ambulatory Visit: Payer: Self-pay | Admitting: *Deleted

## 2013-09-01 MED ORDER — POTASSIUM CHLORIDE CRYS ER 20 MEQ PO TBCR: 20.0000 meq | EXTENDED_RELEASE_TABLET | Freq: Once | ORAL | Status: DC

## 2013-09-19 ENCOUNTER — Other Ambulatory Visit (HOSPITAL_COMMUNITY): Payer: Medicare Other

## 2013-10-03 ENCOUNTER — Other Ambulatory Visit: Payer: Self-pay | Admitting: Internal Medicine

## 2013-10-03 NOTE — Telephone Encounter (Signed)
Refill done.  

## 2013-10-08 ENCOUNTER — Encounter: Payer: Self-pay | Admitting: Internal Medicine

## 2013-10-08 ENCOUNTER — Ambulatory Visit (INDEPENDENT_AMBULATORY_CARE_PROVIDER_SITE_OTHER): Payer: Medicare Other | Admitting: Internal Medicine

## 2013-10-08 ENCOUNTER — Other Ambulatory Visit (INDEPENDENT_AMBULATORY_CARE_PROVIDER_SITE_OTHER): Payer: Medicare Other

## 2013-10-08 VITALS — BP 102/70 | HR 72 | Temp 99.1°F | Resp 16 | Ht 75.0 in | Wt 290.0 lb

## 2013-10-08 DIAGNOSIS — I10 Essential (primary) hypertension: Secondary | ICD-10-CM

## 2013-10-08 DIAGNOSIS — E538 Deficiency of other specified B group vitamins: Secondary | ICD-10-CM

## 2013-10-08 DIAGNOSIS — E119 Type 2 diabetes mellitus without complications: Secondary | ICD-10-CM

## 2013-10-08 DIAGNOSIS — E559 Vitamin D deficiency, unspecified: Secondary | ICD-10-CM

## 2013-10-08 DIAGNOSIS — M1990 Unspecified osteoarthritis, unspecified site: Secondary | ICD-10-CM

## 2013-10-08 LAB — BASIC METABOLIC PANEL
Chloride: 107 mEq/L (ref 96–112)
Potassium: 4.2 mEq/L (ref 3.5–5.1)

## 2013-10-08 LAB — HEMOGLOBIN A1C: Hgb A1c MFr Bld: 5.6 % (ref 4.6–6.5)

## 2013-10-08 MED ORDER — MELOXICAM 15 MG PO TABS: 15.0000 mg | ORAL_TABLET | Freq: Every day | ORAL | Status: DC

## 2013-10-08 NOTE — Assessment & Plan Note (Signed)
Continue with current prescription therapy as reflected on the Med list.  

## 2013-10-08 NOTE — Progress Notes (Signed)
Pre visit review using our clinic review tool, if applicable. No additional management support is needed unless otherwise documented below in the visit note. 

## 2013-10-08 NOTE — Progress Notes (Signed)
Subjective:    HPI  F/u palpitations yesterday after an hour and 15 min walk. Palpitations started at home. It stopped after 45 min. A little lightheaded then, no CP.  The patient presents for a follow-up of  chronic hypertension, chronic dyslipidemia, type 2 diabetes controlled with medicines. He lost wt on diet; low CBGs at times- he reduced glimeperide to 1 qd  H/o palpitations episodes - rare or none    Wt Readings from Last 3 Encounters:  10/08/13 290 lb (131.543 kg)  08/13/13 290 lb (131.543 kg)  06/23/13 288 lb (130.636 kg)   BP Readings from Last 3 Encounters:  10/08/13 102/70  08/13/13 102/63  06/23/13 112/60     Review of Systems  Constitutional: Negative for appetite change, fatigue and unexpected weight change.  HENT: Negative for congestion, ear pain, hearing loss, nosebleeds, sneezing, sore throat and trouble swallowing.   Eyes: Negative for itching and visual disturbance.  Respiratory: Negative for cough.   Cardiovascular: Negative for chest pain, palpitations and leg swelling.  Gastrointestinal: Negative for nausea, vomiting, diarrhea, blood in stool and abdominal distention.  Genitourinary: Negative for frequency and hematuria.  Musculoskeletal: Negative for back pain, gait problem, joint swelling and neck pain.  Skin: Negative for rash and wound.  Neurological: Negative for dizziness, tremors, speech difficulty, weakness, light-headedness, numbness and headaches.  Psychiatric/Behavioral: Negative for suicidal ideas, sleep disturbance, dysphoric mood and agitation. The patient is not nervous/anxious and is not hyperactive.        Objective:   Physical Exam  Constitutional: He is oriented to person, place, and time. He appears well-developed. No distress.  Less obese   HENT:  Nose: Nose normal.  Mouth/Throat: Oropharynx is clear and moist. No oropharyngeal exudate.  Eyes: Conjunctivae are normal. Pupils are equal, round, and reactive to light.   Neck: Normal range of motion. No JVD present. No thyromegaly present.  Cardiovascular: Normal rate, regular rhythm, normal heart sounds and intact distal pulses.  Exam reveals no gallop and no friction rub.   No murmur heard. Pulmonary/Chest: Effort normal and breath sounds normal. No respiratory distress. He has no wheezes. He has no rales. He exhibits no tenderness.  Abdominal: Soft. Bowel sounds are normal. He exhibits no distension and no mass. There is no tenderness. There is no rebound and no guarding.  Genitourinary: Rectum normal, prostate normal and penis normal. Guaiac negative stool. No penile tenderness.  Musculoskeletal: Normal range of motion. He exhibits no edema and no tenderness.  Lymphadenopathy:    He has no cervical adenopathy.  Neurological: He is alert and oriented to person, place, and time. He has normal reflexes. No cranial nerve deficit. He exhibits normal muscle tone. Coordination normal.  Skin: Skin is warm and dry. No rash noted. He is not diaphoretic.  Psychiatric: He has a normal mood and affect. His behavior is normal. Judgment and thought content normal.  [facility] Lab Results  Component Value Date   WBC 5.9 01/23/2013   HGB 15.9 01/23/2013   HCT 46.7 01/23/2013   PLT 179.0 01/23/2013   GLUCOSE 94 06/19/2013   CHOL 182 10/07/2012   TRIG 117.0 10/07/2012   HDL 30.20* 10/07/2012   LDLDIRECT 88.1 01/23/2011   LDLCALC 128* 10/07/2012   ALT 11 10/07/2012   AST 15 10/07/2012   NA 138 06/19/2013   K 3.8 06/19/2013   CL 107 06/19/2013   CREATININE 1.1 06/19/2013   BUN 13 06/19/2013   CO2 24 06/19/2013   TSH 0.80 06/19/2013  PSA 0.46 10/07/2012   HGBA1C 5.6 05/26/2013   MICROALBUR 0.3 01/23/2011           Assessment & Plan:

## 2013-10-08 NOTE — Assessment & Plan Note (Signed)
Will switch to Meloxicam due to cost

## 2013-10-09 ENCOUNTER — Encounter: Payer: Self-pay | Admitting: Internal Medicine

## 2013-10-14 ENCOUNTER — Telehealth: Payer: Self-pay | Admitting: *Deleted

## 2013-10-14 MED ORDER — FUROSEMIDE 40 MG PO TABS: 40.0000 mg | ORAL_TABLET | ORAL | Status: DC

## 2013-10-14 NOTE — Telephone Encounter (Signed)
Patient phoned requesting his lasix 40 mg to be refilled to correct pharmacy.  Correct pharmacy confirmed.  Refill request sent to corrected pharmacy.  Patient aware.

## 2013-12-12 ENCOUNTER — Other Ambulatory Visit: Payer: Self-pay | Admitting: Internal Medicine

## 2013-12-30 ENCOUNTER — Ambulatory Visit (INDEPENDENT_AMBULATORY_CARE_PROVIDER_SITE_OTHER): Payer: Medicare Other | Admitting: Internal Medicine

## 2013-12-30 ENCOUNTER — Encounter: Payer: Self-pay | Admitting: Internal Medicine

## 2013-12-30 VITALS — BP 134/62 | HR 76 | Temp 98.1°F | Resp 16 | Wt 294.0 lb

## 2013-12-30 DIAGNOSIS — Z23 Encounter for immunization: Secondary | ICD-10-CM

## 2013-12-30 DIAGNOSIS — R002 Palpitations: Secondary | ICD-10-CM

## 2013-12-30 DIAGNOSIS — E119 Type 2 diabetes mellitus without complications: Secondary | ICD-10-CM

## 2013-12-30 DIAGNOSIS — R1031 Right lower quadrant pain: Secondary | ICD-10-CM | POA: Insufficient documentation

## 2013-12-30 NOTE — Assessment & Plan Note (Signed)
R groin pain and swelling after a walk. It was obvious and resolved in a couple hrs - r/o hernia He is to travel in the end of April Surg ref

## 2013-12-30 NOTE — Assessment & Plan Note (Signed)
Resolved

## 2013-12-30 NOTE — Progress Notes (Signed)
Subjective:    HPI C/o R groin pain and swelling after a walk. It was obvious and resolved in a couple hrs  F/u palpitations  - no relapse  The patient presents for a follow-up of  chronic hypertension, chronic dyslipidemia, type 2 diabetes controlled with medicines. He lost wt on diet; low CBGs at times- he reduced glimeperide to 1 qd     Wt Readings from Last 3 Encounters:  12/30/13 294 lb (133.358 kg)  10/08/13 290 lb (131.543 kg)  08/13/13 290 lb (131.543 kg)   BP Readings from Last 3 Encounters:  12/30/13 134/62  10/08/13 102/70  08/13/13 102/63     Review of Systems  Constitutional: Negative for appetite change, fatigue and unexpected weight change.  HENT: Negative for congestion, ear pain, hearing loss, nosebleeds, sneezing, sore throat and trouble swallowing.   Eyes: Negative for itching and visual disturbance.  Respiratory: Negative for cough.   Cardiovascular: Negative for chest pain, palpitations and leg swelling.  Gastrointestinal: Negative for nausea, vomiting, diarrhea, blood in stool and abdominal distention.  Genitourinary: Negative for frequency and hematuria.  Musculoskeletal: Negative for back pain, gait problem, joint swelling and neck pain.  Skin: Negative for rash and wound.  Neurological: Negative for dizziness, tremors, speech difficulty, weakness, light-headedness, numbness and headaches.  Psychiatric/Behavioral: Negative for suicidal ideas, sleep disturbance, dysphoric mood and agitation. The patient is not nervous/anxious and is not hyperactive.        Objective:   Physical Exam  Constitutional: He is oriented to person, place, and time. He appears well-developed. No distress.  Less obese   HENT:  Nose: Nose normal.  Mouth/Throat: Oropharynx is clear and moist. No oropharyngeal exudate.  Eyes: Conjunctivae are normal. Pupils are equal, round, and reactive to light.  Neck: Normal range of motion. No JVD present. No thyromegaly present.   Cardiovascular: Normal rate, regular rhythm, normal heart sounds and intact distal pulses.  Exam reveals no gallop and no friction rub.   No murmur heard. Pulmonary/Chest: Effort normal and breath sounds normal. No respiratory distress. He has no wheezes. He has no rales. He exhibits no tenderness.  Abdominal: Soft. Bowel sounds are normal. He exhibits no distension and no mass. There is no tenderness. There is no rebound and no guarding.  Genitourinary: Penis normal. Guaiac negative stool. No penile tenderness.  I do not feel a hernia or varicies in B groin at the moment  Musculoskeletal: Normal range of motion. He exhibits no edema and no tenderness.  Lymphadenopathy:    He has no cervical adenopathy.  Neurological: He is alert and oriented to person, place, and time. He has normal reflexes. No cranial nerve deficit. He exhibits normal muscle tone. Coordination normal.  Skin: Skin is warm and dry. No rash noted. He is not diaphoretic.  Psychiatric: He has a normal mood and affect. His behavior is normal. Judgment and thought content normal.  [facility]   Lab Results  Component Value Date   WBC 5.9 01/23/2013   HGB 15.9 01/23/2013   HCT 46.7 01/23/2013   PLT 179.0 01/23/2013   GLUCOSE 95 10/08/2013   CHOL 182 10/07/2012   TRIG 117.0 10/07/2012   HDL 30.20* 10/07/2012   LDLDIRECT 88.1 01/23/2011   LDLCALC 128* 10/07/2012   ALT 11 10/07/2012   AST 15 10/07/2012   NA 140 10/08/2013   K 4.2 10/08/2013   CL 107 10/08/2013   CREATININE 1.2 10/08/2013   BUN 14 10/08/2013   CO2 27 10/08/2013   TSH  0.80 06/19/2013   PSA 0.46 10/07/2012   HGBA1C 5.6 10/08/2013   MICROALBUR 0.3 01/23/2011           Assessment & Plan:

## 2013-12-30 NOTE — Progress Notes (Signed)
Pre visit review using our clinic review tool, if applicable. No additional management support is needed unless otherwise documented below in the visit note. 

## 2013-12-30 NOTE — Assessment & Plan Note (Signed)
Continue with current prescription therapy as reflected on the Med list.  

## 2014-01-05 ENCOUNTER — Other Ambulatory Visit: Payer: Self-pay | Admitting: Internal Medicine

## 2014-01-07 DIAGNOSIS — K4090 Unilateral inguinal hernia, without obstruction or gangrene, not specified as recurrent: Secondary | ICD-10-CM

## 2014-01-07 HISTORY — DX: Unilateral inguinal hernia, without obstruction or gangrene, not specified as recurrent: K40.90

## 2014-01-08 ENCOUNTER — Telehealth: Payer: Self-pay

## 2014-01-08 NOTE — Telephone Encounter (Signed)
Relevant patient education mailed to patient.  

## 2014-01-11 ENCOUNTER — Encounter (HOSPITAL_COMMUNITY): Payer: Self-pay | Admitting: Emergency Medicine

## 2014-01-11 ENCOUNTER — Emergency Department (HOSPITAL_COMMUNITY)
Admission: EM | Admit: 2014-01-11 | Discharge: 2014-01-11 | Disposition: A | Discharge status: Home / Self Care | Payer: Medicare Other | Attending: Emergency Medicine | Admitting: Emergency Medicine

## 2014-01-11 DIAGNOSIS — F411 Generalized anxiety disorder: Secondary | ICD-10-CM | POA: Insufficient documentation

## 2014-01-11 DIAGNOSIS — Z87891 Personal history of nicotine dependence: Secondary | ICD-10-CM | POA: Insufficient documentation

## 2014-01-11 DIAGNOSIS — Z8719 Personal history of other diseases of the digestive system: Secondary | ICD-10-CM | POA: Insufficient documentation

## 2014-01-11 DIAGNOSIS — M1710 Unilateral primary osteoarthritis, unspecified knee: Secondary | ICD-10-CM | POA: Insufficient documentation

## 2014-01-11 DIAGNOSIS — Z79899 Other long term (current) drug therapy: Secondary | ICD-10-CM | POA: Insufficient documentation

## 2014-01-11 DIAGNOSIS — Z7982 Long term (current) use of aspirin: Secondary | ICD-10-CM | POA: Insufficient documentation

## 2014-01-11 DIAGNOSIS — IMO0002 Reserved for concepts with insufficient information to code with codable children: Secondary | ICD-10-CM | POA: Insufficient documentation

## 2014-01-11 DIAGNOSIS — K4090 Unilateral inguinal hernia, without obstruction or gangrene, not specified as recurrent: Secondary | ICD-10-CM | POA: Insufficient documentation

## 2014-01-11 DIAGNOSIS — E538 Deficiency of other specified B group vitamins: Secondary | ICD-10-CM | POA: Insufficient documentation

## 2014-01-11 DIAGNOSIS — E119 Type 2 diabetes mellitus without complications: Secondary | ICD-10-CM | POA: Insufficient documentation

## 2014-01-11 DIAGNOSIS — Z791 Long term (current) use of non-steroidal anti-inflammatories (NSAID): Secondary | ICD-10-CM | POA: Insufficient documentation

## 2014-01-11 DIAGNOSIS — I10 Essential (primary) hypertension: Secondary | ICD-10-CM | POA: Insufficient documentation

## 2014-01-11 LAB — URINALYSIS, ROUTINE W REFLEX MICROSCOPIC
Glucose, UA: NEGATIVE mg/dL
HGB URINE DIPSTICK: NEGATIVE
KETONES UR: 15 mg/dL — AB
Leukocytes, UA: NEGATIVE
Nitrite: NEGATIVE
Protein, ur: NEGATIVE mg/dL
Specific Gravity, Urine: 1.031 — ABNORMAL HIGH (ref 1.005–1.030)
UROBILINOGEN UA: 0.2 mg/dL (ref 0.0–1.0)
pH: 5 (ref 5.0–8.0)

## 2014-01-11 MED ORDER — HYDROMORPHONE HCL PF 1 MG/ML IJ SOLN
2.0000 mg | Freq: Once | INTRAMUSCULAR | Status: DC
  Filled 2014-01-11: qty 2

## 2014-01-11 MED ORDER — TRAMADOL HCL 50 MG PO TABS: 50.0000 mg | ORAL_TABLET | Freq: Four times a day (QID) | ORAL | Status: DC | PRN

## 2014-01-11 MED ORDER — ONDANSETRON 4 MG PO TBDP
4.0000 mg | ORAL_TABLET | Freq: Once | ORAL | Status: DC
  Filled 2014-01-11: qty 1

## 2014-01-11 NOTE — Progress Notes (Signed)
Patient ID: Brian Mccullough, male   DOB: 1951/04/26, 63 y.o.   MRN: [id number] Known HX RIH scheduled to see Dr. Dalbert Batman Tuesday. On my exam hernia is reduced. Recommend pain meds and F/U as scheduled. Will hold off on formal consult until he comes to the office. Georganna Skeans, MD, MPH, FACS Trauma: [phone] General Surgery: [phone]

## 2014-01-11 NOTE — ED Provider Notes (Signed)
CSN: [id number]     Arrival date & time 01/11/14  1846 History   First MD Initiated Contact with Patient 01/11/14 1930     Chief Complaint  Patient presents with  . Hernia     (Consider location/radiation/quality/duration/timing/severity/associated sxs/prior Treatment) HPI Pt with known R inguinal hernia is scheduled to see General Surgery later this week. He has noticed increased pain and swelling in R groin since earlier today. Pain is worse with movement. No abdominal pain or vomiting. Ate a full meal just prior to arrival.   Past Medical History  Diagnosis Date  . Diabetes mellitus   . Hypertension   . Anxiety   . ED (erectile dysfunction)   . Vitamin B 12 deficiency   . Alcoholism   . OA (osteoarthritis) of knee   . LBP (low back pain)   . Hx of cardiovascular stress test 08/2013    Lexiscan Myoview (11/14):  Diaphragmatic attenuation, no ischemia, EF 55%, normal study.   Past Surgical History  Procedure Laterality Date  . Total knee arthroplasty      left  . Knee arthroscopy      right- Dr. Jodell Cipro  . Inguinal hernia repair  1958  . Tonsillectomy and adenoidectomy  1960   Family History  Problem Relation Age of Onset  . Hypertension Other   . Hypertension Mother   . Diabetes Mother   . Diabetes Father    History  Substance Use Topics  . Smoking status: Former Research scientist (life sciences)  . Smokeless tobacco: Never Used  . Alcohol Use: No     Comment: It was up to 2 gallon of Vodka/week.  8 wks. off ETOH since 7/9    Review of Systems All other systems reviewed and are negative except as noted in HPI.     Allergies  Codeine  Home Medications   Current Outpatient Rx  Name  Route  Sig  Dispense  Refill  . aspirin 81 MG EC tablet   Oral   Take 81 mg by mouth daily.           . Cholecalciferol (VITAMIN D3) 1000 UNITS tablet   Oral   Take 1,000 Units by mouth daily.           . cyanocobalamin 100 MCG tablet   Oral   Take 100 mcg by mouth daily.           .  furosemide (LASIX) 40 MG tablet   Oral   Take 1 tablet (40 mg total) by mouth every other day.   30 tablet   5   . ibuprofen (ADVIL,MOTRIN) 600 MG tablet   Oral   Take 1 tablet (600 mg total) by mouth 2 (two) times daily as needed for pain.   60 tablet   1   . meloxicam (MOBIC) 15 MG tablet   Oral   Take 1 tablet (15 mg total) by mouth daily.   90 tablet   2   . metoprolol tartrate (LOPRESSOR) 25 MG tablet   Oral   Take 1-2 tablets (25-50 mg total) by mouth 2 (two) times daily as needed (palpitations).   60 tablet   3   . triamcinolone (KENALOG) 0.1 % ointment   Topical   Apply 1 application topically 2 (two) times daily at 10 AM and 5 PM.            BP 105/69  Pulse 68  Temp(Src) 98.2 F (36.8 C) (Oral)  Resp 18  Ht 6'  3" (1.905 m)  Wt 295 lb (133.811 kg)  BMI 36.87 kg/m2  SpO2 98% Physical Exam  Nursing note and vitals reviewed. Constitutional: He is oriented to person, place, and time. He appears well-developed and well-nourished.  HENT:  Head: Normocephalic and atraumatic.  Eyes: EOM are normal. Pupils are equal, round, and reactive to light.  Neck: Normal range of motion. Neck supple.  Cardiovascular: Normal rate, normal heart sounds and intact distal pulses.   Pulmonary/Chest: Effort normal and breath sounds normal.  Abdominal: Bowel sounds are normal. He exhibits no distension. There is no tenderness. There is no rebound.  Genitourinary:  R inguinal hernia, mildly tender to palpation, unable to reduce  Musculoskeletal: Normal range of motion. He exhibits no edema and no tenderness.  Neurological: He is alert and oriented to person, place, and time. He has normal strength. No cranial nerve deficit or sensory deficit.  Skin: Skin is warm and dry. No rash noted.  Psychiatric: He has a normal mood and affect.    ED Course  Procedures (including critical care time) Labs Review Labs Reviewed  URINALYSIS, ROUTINE W REFLEX MICROSCOPIC - Abnormal; Notable  for the following:    Specific Gravity, Urine 1.031 (*)    Bilirubin Urine SMALL (*)    Ketones, ur 15 (*)    All other components within normal limits   Imaging Review No results found.   EKG Interpretation None      MDM   Final diagnoses:  Inguinal hernia    Pt with inguinal hernia, unable to reduce. Will place supine, IM pain meds. Dr. Grandville Silos from Gen Surg to see patient in the ED. Hold off on labs for now.   7:42 PM Dr Grandville Silos states hernia was back in before he saw the patient. He recommends pain meds and hernia truss at home. Followup with Dr. Dalbert Batman in 2 days as scheduled.   Clyde Upshaw B. Karle Starch, MD 01/11/14 1945

## 2014-01-11 NOTE — ED Notes (Signed)
The pt has been diagnosed with an inguinal hernia 2 weeks ago by his doctor.  He has an appointment with a surgeon Tuesday.  Today the pain has been more severe and the swollen area in his rt groin is not getting smaller and it is hard

## 2014-01-11 NOTE — Discharge Instructions (Signed)

## 2014-01-13 ENCOUNTER — Encounter (INDEPENDENT_AMBULATORY_CARE_PROVIDER_SITE_OTHER): Payer: Self-pay | Admitting: General Surgery

## 2014-01-13 ENCOUNTER — Ambulatory Visit (INDEPENDENT_AMBULATORY_CARE_PROVIDER_SITE_OTHER): Payer: Medicare Other | Admitting: General Surgery

## 2014-01-13 VITALS — BP 134/84 | HR 80 | Temp 98.0°F | Resp 16 | Ht 75.0 in | Wt 293.8 lb

## 2014-01-13 DIAGNOSIS — K4091 Unilateral inguinal hernia, without obstruction or gangrene, recurrent: Secondary | ICD-10-CM | POA: Insufficient documentation

## 2014-01-13 DIAGNOSIS — K4090 Unilateral inguinal hernia, without obstruction or gangrene, not specified as recurrent: Secondary | ICD-10-CM | POA: Insufficient documentation

## 2014-01-13 NOTE — Progress Notes (Signed)
Patient ID: Brian Mccullough, male   DOB: Apr 19, 1951, 63 y.o.   MRN: [id number]  Chief Complaint  Patient presents with  . eval hernia    new pt    HPI Brian Mccullough is a 63 y.o. male.  He has seen Dr. Alain Marion and Dr. Marily Memos for right groin pain. It is felt he has a right inguinal hernia.  The patient states that he had Left inguinal hernia repair at age 23. The past 9 months she's noticed progressive pain in his right groin after he walks. Initial evaluation with Dr. Alain Marion failed to reveal the hernia. Evaluation by Marily Memos failed to reveal any bony or joint abnormalities. He says it is now obvious that he has a bulge. No bowel pain nausea or vomiting. The pain was apparently pretty bad this past weekend he went to the emergency room. Apparently saw Dr. Elwin Mocha who reduced a right inguinal hernia. Since he already had an appointment with me he recommended that he keep that appointment for definitive surgical management.  More recently, he states that he's noticed some stinging discomfort in his left groin.  Past history significant for obesity. Hypertension on Lopressor and Lasix and aspirin. Type 2 diabetes. 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 Nonsmoker.  Retired. Married.  HPI  Past Medical History  Diagnosis Date  . Diabetes mellitus   . Hypertension   . Anxiety   . ED (erectile dysfunction)   . Vitamin B 12 deficiency   . Alcoholism   . OA (osteoarthritis) of knee   . LBP (low back pain)   . Hx of cardiovascular stress test 08/2013    Lexiscan Myoview (11/14):  Diaphragmatic attenuation, no ischemia, EF 55%, normal study.    Past Surgical History  Procedure Laterality Date  . Total knee arthroplasty      left  . Knee arthroscopy      right- Dr. Jodell Cipro  . Inguinal hernia repair  1958  . Tonsillectomy and adenoidectomy  1960    Family History  Problem Relation Age of Onset  . Hypertension Other   .  Hypertension Mother   . Diabetes Mother   . Diabetes Father     Social History History  Substance Use Topics  . Smoking status: Former Research scientist (life sciences)  . Smokeless tobacco: Never Used  . Alcohol Use: No     Comment: It was up to 2 gallon of Vodka/week.  8 wks. off ETOH since 7/9    Allergies  Allergen Reactions  . Codeine Nausea Only    REACTION: nausea    Current Outpatient Prescriptions  Medication Sig Dispense Refill  . amLODipine (NORVASC) 5 MG tablet Take 5 mg by mouth daily.      Marland Kitchen aspirin 81 MG EC tablet Take 81 mg by mouth daily.        . benazepril (LOTENSIN) 40 MG tablet Take 40 mg by mouth daily.      . Cholecalciferol (VITAMIN D3) 1000 UNITS tablet Take 1,000 Units by mouth daily.        . clotrimazole-betamethasone (LOTRISONE) cream Apply 1 application topically 2 (two) times daily as needed (yeast).      . COCONUT OIL PO Take 1 tablet by mouth daily.      . cyanocobalamin 100 MCG tablet Take 100 mcg by mouth daily.        . furosemide (LASIX) 40 MG tablet Take 1 tablet (40 mg total) by mouth every other day.  [place]  tablet  5  . ibuprofen (ADVIL,MOTRIN) 600 MG tablet Take 1 tablet (600 mg total) by mouth 2 (two) times daily as needed for pain.  60 tablet  1  . meloxicam (MOBIC) 15 MG tablet Take 1 tablet (15 mg total) by mouth daily.  90 tablet  2  . metFORMIN (GLUCOPHAGE) 500 MG tablet Take 500 mg by mouth daily.      . metoprolol tartrate (LOPRESSOR) 25 MG tablet Take 1-2 tablets (25-50 mg total) by mouth 2 (two) times daily as needed (palpitations).  60 tablet  3  . potassium chloride SA (K-DUR,KLOR-CON) 20 MEQ tablet Take 20 mEq by mouth daily.      Marland Kitchen tiZANidine (ZANAFLEX) 4 MG tablet Take 4 mg by mouth every 8 (eight) hours as needed for muscle spasms.      . traMADol (ULTRAM) 50 MG tablet Take 1 tablet (50 mg total) by mouth every 6 (six) hours as needed for moderate pain.  30 tablet  0  . triamcinolone (KENALOG) 0.1 % ointment Apply 1 application topically 2 (two) times  daily as needed (rash).        Current Facility-Administered Medications  Medication Dose Route Frequency Provider Last Rate Last Dose  . testosterone cypionate (DEPOTESTOTERONE CYPIONATE) injection 200 mg  200 mg Intramuscular Q14 Days Brian Anger, MD        Review of Systems Review of Systems  Constitutional: Negative for fever, chills and unexpected weight change.  HENT: Negative for congestion, hearing loss, sore throat, trouble swallowing and voice change.   Eyes: Negative for visual disturbance.  Respiratory: Negative for cough and wheezing.   Cardiovascular: Negative for chest pain, palpitations and leg swelling.  Gastrointestinal: Negative for nausea, vomiting, abdominal pain, diarrhea, constipation, blood in stool, abdominal distention, anal bleeding and rectal pain.  Genitourinary: Negative for hematuria and difficulty urinating.  Musculoskeletal: Positive for joint swelling and myalgias. Negative for arthralgias.  Skin: Negative for rash and wound.  Neurological: Negative for seizures, syncope, weakness and headaches.  Hematological: Negative for adenopathy. Does not bruise/bleed easily.  Psychiatric/Behavioral: Negative for confusion. The patient is nervous/anxious.     Blood pressure 134/84, pulse 80, temperature 98 F (36.7 C), temperature source Oral, resp. rate 16, height 6\' 3"  (1.905 m), weight 293 lb 12.8 oz (133.267 kg).  Physical Exam Physical Exam  Constitutional: He is oriented to person, place, and time. He appears well-developed and well-nourished. No distress.  HENT:  Head: Normocephalic.  Nose: Nose normal.  Mouth/Throat: No oropharyngeal exudate.  Eyes: Conjunctivae and EOM are normal. Pupils are equal, round, and reactive to light. Right eye exhibits no discharge. Left eye exhibits no discharge. No scleral icterus.  Neck: Normal range of motion. Neck supple. No JVD present. No tracheal deviation present. No thyromegaly present.  Cardiovascular:  Normal rate, regular rhythm, normal heart sounds and intact distal pulses.   No murmur heard. Pulmonary/Chest: Effort normal and breath sounds normal. No stridor. No respiratory distress. He has no wheezes. He has no rales. He exhibits no tenderness.  Abdominal: Soft. Bowel sounds are normal. He exhibits no distension and no mass. There is no tenderness. There is no rebound and no guarding.  Somewhat obese. Umbilicus normal. Liver not enlarged.  Genitourinary:  Medium size right inguinal hernia, a little bit tender. Partially reducible. No scrotal mass. Smaller left inguinal hernia, but obvious.  Musculoskeletal: Normal range of motion. He exhibits no edema and no tenderness.  Lymphadenopathy:    He has no cervical adenopathy.  Neurological: He is alert and oriented to person, place, and time. He has normal reflexes. Coordination normal.  Skin: Skin is warm and dry. No rash noted. He is not diaphoretic. No erythema. No pallor.  Psychiatric: He has a normal mood and affect. His behavior is normal. Judgment and thought content normal.    Data Reviewed [facility] our office notes. ED notes. Lab work.  Assessment    Partial incarcerated right inguinal hernia without compromise bowel  Recurrent left inguinal hernia, becoming symptomatic  Obesity  Hypertension  Type 2 diabetes     Plan    He would like to have these hernias repaired, which I think is very reasonable considering his symptoms. He wants both repaired simultaneously  We'll schedule for open repair of bilateral inguinal hernias with mesh. I am reluctant to attempt this laparoscopically since the right side seems partially incarcerated.  I discussed the indications, details, techniques, numerous risk of the surgery with him. He is aware of the risk of bleeding, infection, nerve damage chronic pain, recurrence, injury to adjacent organs his intestine or bladder .  All of his questions rancher. He understands all these issues. He  agrees with this plan.       Brian Mccullough. Brian Mccullough, M.D., [facility] General and Minimally invasive Surgery Breast and Colorectal Surgery Office:   [phone] Pager:   [phone]  01/13/2014, 5:05 PM

## 2014-01-13 NOTE — Patient Instructions (Signed)
You have an obvious right inguinal hernia. This is only partially reducible, so it appears that it is somewhat incarcerated. This is the cause of your pain.There is no sign of any intestinal obstruction, however.  You also have a smaller recurrent left inguinal hernia.  We have talked about techniques for repair. Temporary disability. Risks and complications. You have stated that you would like to go ahead with the surgery  You will be scheduled for open repair of bilateral inguinal hernias with mesh sometime in May after you return from the wedding in the [place]    Inguinal Hernia, Adult Muscles help keep everything in the body in its proper place. But if a weak spot in the muscles develops, something can poke through. That is called a hernia. When this happens in the lower part of the belly (abdomen), it is called an inguinal hernia. (It takes its name from a part of the body in this region called the inguinal canal.) A weak spot in the wall of muscles lets some fat or part of the small intestine bulge through. An inguinal hernia can develop at any age. Men get them more often than women. CAUSES  In adults, an inguinal hernia develops over time.  It can be triggered by:  Suddenly straining the muscles of the lower abdomen.  Lifting heavy objects.  Straining to have a bowel movement. Difficult bowel movements (constipation) can lead to this.  Constant coughing. This may be caused by smoking or lung disease.  Being overweight.  Being pregnant.  Working at a job that requires long periods of standing or heavy lifting.  Having had an inguinal hernia before. One type can be an emergency situation. It is called a strangulated inguinal hernia. It develops if part of the small intestine slips through the weak spot and cannot get back into the abdomen. The blood supply can be cut off. If that happens, part of the intestine may die. This situation requires emergency  surgery. SYMPTOMS  Often, a small inguinal hernia has no symptoms. It is found when a healthcare provider does a physical exam. Larger hernias usually have symptoms.   In adults, symptoms may include:  A lump in the groin. This is easier to see when the person is standing. It might disappear when lying down.  In men, a lump in the scrotum.  Pain or burning in the groin. This occurs especially when lifting, straining or coughing.  A dull ache or feeling of pressure in the groin.  Signs of a strangulated hernia can include:  A bulge in the groin that becomes very painful and tender to the touch.  A bulge that turns red or purple.  Fever, nausea and vomiting.  Inability to have a bowel movement or to pass gas. DIAGNOSIS  To decide if you have an inguinal hernia, a healthcare provider will probably do a physical examination.  This will include asking questions about any symptoms you have noticed.  The healthcare provider might feel the groin area and ask you to cough. If an inguinal hernia is felt, the healthcare provider may try to slide it back into the abdomen.  Usually no other tests are needed. TREATMENT  Treatments can vary. The size of the hernia makes a difference. Options include:  Watchful waiting. This is often suggested if the hernia is small and you have had no symptoms.  No medical procedure will be done unless symptoms develop.  You will need to watch closely for symptoms. If any  occur, contact your healthcare provider right away.  Surgery. This is used if the hernia is larger or you have symptoms.  Open surgery. This is usually an outpatient procedure (you will not stay overnight in a hospital). An cut (incision) is made through the skin in the groin. The hernia is put back inside the abdomen. The weak area in the muscles is then repaired by herniorrhaphy or hernioplasty. Herniorrhaphy: in this type of surgery, the weak muscles are sewn back together.  Hernioplasty: a patch or mesh is used to close the weak area in the abdominal wall.  Laparoscopy. In this procedure, a surgeon makes small incisions. A thin tube with a tiny video camera (called a laparoscope) is put into the abdomen. The surgeon repairs the hernia with mesh by looking with the video camera and using two long instruments. HOME CARE INSTRUCTIONS   After surgery to repair an inguinal hernia:  You will need to take pain medicine prescribed by your healthcare provider. Follow all directions carefully.  You will need to take care of the wound from the incision.  Your activity will be restricted for awhile. This will probably include no heavy lifting for several weeks. You also should not do anything too active for a few weeks. When you can return to work will depend on the type of job that you have.  During "watchful waiting" periods, you should:  Maintain a healthy weight.  Eat a diet high in fiber (fruits, vegetables and whole grains).  Drink plenty of fluids to avoid constipation. This means drinking enough water and other liquids to keep your urine clear or pale yellow.  Do not lift heavy objects.  Do not stand for long periods of time.  Quit smoking. This should keep you from developing a frequent cough. SEEK MEDICAL CARE IF:   A bulge develops in your groin area.  You feel pain, a burning sensation or pressure in the groin. This might be worse if you are lifting or straining.  You develop a fever of more than 100.5 F (38.1 C). SEEK IMMEDIATE MEDICAL CARE IF:   Pain in the groin increases suddenly.  A bulge in the groin gets bigger suddenly and does not go down.  For men, there is sudden pain in the scrotum. Or, the size of the scrotum increases.  A bulge in the groin area becomes red or purple and is painful to touch.  You have nausea or vomiting that does not go away.  You feel your heart beating much faster than normal.  You cannot have a bowel  movement or pass gas.  You develop a fever of more than 102.0 F (38.9 C). Document Released: 02/11/2009 Document Revised: 12/18/2011 Document Reviewed: 02/11/2009 [facility] Patient Information 2014 [place], [place].

## 2014-01-15 ENCOUNTER — Telehealth (INDEPENDENT_AMBULATORY_CARE_PROVIDER_SITE_OTHER): Payer: Self-pay

## 2014-01-15 NOTE — Telephone Encounter (Signed)
Yesterday, pt had an episode of pain, burning and spasms in the right inguinal area.  He also reports that his right testicle became more swollen and tender.  After lying down a while the spasms and some of the swelling resolved.  If this happens again I suggested he lie down and gently push the hernia back in.  Apply ice and take Ibuprofen.  He has not heard from the surgery schedulers as yet, so this message will be routed to them as well.

## 2014-01-20 ENCOUNTER — Other Ambulatory Visit: Payer: Self-pay | Admitting: Internal Medicine

## 2014-02-02 ENCOUNTER — Encounter: Payer: Self-pay | Admitting: Internal Medicine

## 2014-02-02 ENCOUNTER — Encounter (HOSPITAL_BASED_OUTPATIENT_CLINIC_OR_DEPARTMENT_OTHER): Payer: Self-pay | Admitting: *Deleted

## 2014-02-02 ENCOUNTER — Other Ambulatory Visit (INDEPENDENT_AMBULATORY_CARE_PROVIDER_SITE_OTHER): Payer: Medicare Other

## 2014-02-02 ENCOUNTER — Ambulatory Visit (INDEPENDENT_AMBULATORY_CARE_PROVIDER_SITE_OTHER): Payer: Medicare Other | Admitting: Internal Medicine

## 2014-02-02 VITALS — BP 110/60 | HR 68 | Temp 100.4°F | Resp 16 | Wt 286.0 lb

## 2014-02-02 DIAGNOSIS — A09 Infectious gastroenteritis and colitis, unspecified: Secondary | ICD-10-CM | POA: Insufficient documentation

## 2014-02-02 DIAGNOSIS — I10 Essential (primary) hypertension: Secondary | ICD-10-CM

## 2014-02-02 DIAGNOSIS — M545 Low back pain, unspecified: Secondary | ICD-10-CM

## 2014-02-02 DIAGNOSIS — E785 Hyperlipidemia, unspecified: Secondary | ICD-10-CM

## 2014-02-02 DIAGNOSIS — E119 Type 2 diabetes mellitus without complications: Secondary | ICD-10-CM

## 2014-02-02 DIAGNOSIS — R1031 Right lower quadrant pain: Secondary | ICD-10-CM

## 2014-02-02 DIAGNOSIS — E538 Deficiency of other specified B group vitamins: Secondary | ICD-10-CM

## 2014-02-02 LAB — BASIC METABOLIC PANEL
BUN: 21 mg/dL (ref 6–23)
CALCIUM: 9 mg/dL (ref 8.4–10.5)
CO2: 25 meq/L (ref 19–32)
Chloride: 108 mEq/L (ref 96–112)
Creatinine, Ser: 1.2 mg/dL (ref 0.4–1.5)
GFR: 81.25 mL/min (ref >60.00)
GLUCOSE: 107 mg/dL — AB (ref 70–99)
Potassium: 3.8 mEq/L (ref 3.5–5.1)
SODIUM: 141 meq/L (ref 135–145)

## 2014-02-02 LAB — HEMOGLOBIN A1C: Hgb A1c MFr Bld: 5.5 % (ref 4.6–6.5)

## 2014-02-02 MED ORDER — CIPROFLOXACIN HCL 500 MG PO TABS: 500.0000 mg | ORAL_TABLET | Freq: Two times a day (BID) | ORAL | Status: DC

## 2014-02-02 NOTE — Assessment & Plan Note (Signed)
Continue with current prescription therapy as reflected on the Med list.  

## 2014-02-02 NOTE — Progress Notes (Signed)
Subjective:    HPI C/o diarrhea after a return from [place] last week F/u R groin hernia - surgery pending F/u palpitations  - no relapse  The patient presents for a follow-up of  chronic hypertension, chronic dyslipidemia, type 2 diabetes controlled with medicines. He lost wt on diet; low CBGs at times- he reduced glimeperide to 1 qd     Wt Readings from Last 3 Encounters:  02/02/14 286 lb (129.729 kg)  01/13/14 293 lb 12.8 oz (133.267 kg)  01/11/14 295 lb (133.811 kg)   BP Readings from Last 3 Encounters:  02/02/14 110/60  01/13/14 134/84  01/11/14 116/71     Review of Systems  Constitutional: Negative for appetite change, fatigue and unexpected weight change.  HENT: Negative for congestion, ear pain, hearing loss, nosebleeds, sneezing, sore throat and trouble swallowing.   Eyes: Negative for itching and visual disturbance.  Respiratory: Negative for cough.   Cardiovascular: Negative for chest pain, palpitations and leg swelling.  Gastrointestinal: Negative for nausea, vomiting, diarrhea, blood in stool and abdominal distention.  Genitourinary: Negative for frequency and hematuria.  Musculoskeletal: Negative for back pain, gait problem, joint swelling and neck pain.  Skin: Negative for rash and wound.  Neurological: Negative for dizziness, tremors, speech difficulty, weakness, light-headedness, numbness and headaches.  Psychiatric/Behavioral: Negative for suicidal ideas, sleep disturbance, dysphoric mood and agitation. The patient is not nervous/anxious and is not hyperactive.        Objective:   Physical Exam  Constitutional: He is oriented to person, place, and time. He appears well-developed. No distress.  Less obese   HENT:  Nose: Nose normal.  Mouth/Throat: Oropharynx is clear and moist. No oropharyngeal exudate.  Eyes: Conjunctivae are normal. Pupils are equal, round, and reactive to light.  Neck: Normal range of motion. No JVD present. No  thyromegaly present.  Cardiovascular: Normal rate, regular rhythm, normal heart sounds and intact distal pulses.  Exam reveals no gallop and no friction rub.   No murmur heard. Pulmonary/Chest: Effort normal and breath sounds normal. No respiratory distress. He has no wheezes. He has no rales. He exhibits no tenderness.  Abdominal: Soft. Bowel sounds are normal. He exhibits no distension and no mass. There is no tenderness. There is no rebound and no guarding.  Genitourinary: Penis normal. Guaiac negative stool. No penile tenderness.  I do not feel a hernia or varicies in B groin at the moment  Musculoskeletal: Normal range of motion. He exhibits no edema and no tenderness.  Lymphadenopathy:    He has no cervical adenopathy.  Neurological: He is alert and oriented to person, place, and time. He has normal reflexes. No cranial nerve deficit. He exhibits normal muscle tone. Coordination normal.  Skin: Skin is warm and dry. No rash noted. He is not diaphoretic.  Psychiatric: He has a normal mood and affect. His behavior is normal. Judgment and thought content normal.  [place]   Lab Results  Component Value Date   WBC 5.9 01/23/2013   HGB 15.9 01/23/2013   HCT 46.7 01/23/2013   PLT 179.0 01/23/2013   GLUCOSE 95 10/08/2013   CHOL 182 10/07/2012   TRIG 117.0 10/07/2012   HDL 30.20* 10/07/2012   LDLDIRECT 88.1 01/23/2011   LDLCALC 128* 10/07/2012   ALT 11 10/07/2012   AST 15 10/07/2012   NA 140 10/08/2013   K 4.2 10/08/2013   CL 107 10/08/2013   CREATININE 1.2 10/08/2013   BUN 14 10/08/2013   CO2 27 10/08/2013   TSH  0.80 06/19/2013   PSA 0.46 10/07/2012   HGBA1C 5.6 10/08/2013   MICROALBUR 0.3 01/23/2011           Assessment & Plan:

## 2014-02-02 NOTE — Patient Instructions (Signed)
Imodium AD as needed

## 2014-02-02 NOTE — Progress Notes (Signed)
Pre visit review using our clinic review tool, if applicable. No additional management support is needed unless otherwise documented below in the visit note. 

## 2014-02-02 NOTE — Assessment & Plan Note (Addendum)
6/38 [place] trip Cipro bid x 5 d Imodium

## 2014-02-02 NOTE — Progress Notes (Signed)
02/02/14 1511  OBSTRUCTIVE SLEEP APNEA  Have you ever been diagnosed with sleep apnea through a sleep study? No  Do you snore loudly (loud enough to be heard through closed doors)?  0  Do you often feel tired, fatigued, or sleepy during the daytime? 0  Has anyone observed you stop breathing during your sleep? 0  Do you have, or are you being treated for high blood pressure? 1  BMI more than 35 kg/m2? 1  Age over 62 years old? 1  Gender: 1  Obstructive Sleep Apnea Score 4  Score 4 or greater  Results sent to PCP (Dr. Lew Dawes)

## 2014-02-02 NOTE — Assessment & Plan Note (Signed)
Surgery is scheduled 

## 2014-02-03 ENCOUNTER — Telehealth: Payer: Self-pay | Admitting: Internal Medicine

## 2014-02-03 NOTE — H&P (Signed)
Brian Mccullough   MRN:  [id number]   Description: 63 year old male  Provider: [name], Mccullough  Department: [facility]              Diagnoses      Right inguinal hernia    -  Primary      550.90      Recurrent left inguinal hernia          550.91               Current Vitals Most recent update: 01/13/2014  4:21 PM by Brian Mccullough, Brian Mccullough      BP Pulse Temp(Src) Resp Ht Wt      134/84 80 98 F (36.7 C) (Oral) 16 6\' 3"  (1.905 m) 293 lb 12.8 oz (133.267 kg)      BMI 36.72 kg/m2                      History and Physical   Brian Mccullough    Status: Signed            Patient ID: Brian Mccullough, male   DOB: [birth year], 63 y.o.   MRN: [id number]             HPI Brian Mccullough is a 63 y.o. male.  He has seen Dr. Alain Mccullough and Dr. Marily Mccullough for right groin pain. It is felt he has a right inguinal hernia.   The patient states that he had Left inguinal hernia repair at age 54. The past 9 months she's noticed progressive pain in his right groin after he walks. Initial evaluation with Dr. Alain Mccullough failed to reveal the hernia. Evaluation by Marily Mccullough failed to reveal any bony or joint abnormalities. He says it is now obvious that he has a bulge. No bowel pain nausea or vomiting. The pain was apparently pretty bad this past weekend he went to the emergency room. Apparently saw Dr. Elwin Mccullough who reduced a right inguinal hernia. Since he already had an appointment with me he recommended that he keep that appointment for definitive surgical management.   More recently, he states that he's noticed some stinging discomfort in his left groin.   Past history significant for obesity. Hypertension on Lopressor and Lasix and aspirin. Type 2 diabetes. 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 Nonsmoker.   Retired. Married.         Past Medical History   Diagnosis  Date   .  Diabetes mellitus     .  Hypertension     .   Anxiety     .  ED (erectile dysfunction)     .  Vitamin B 12 deficiency     .  Alcoholism     .  OA (osteoarthritis) of knee     .  LBP (low back pain)     .  Hx of cardiovascular stress test  08/2013       Lexiscan Myoview (11/14):  Diaphragmatic attenuation, no ischemia, EF 55%, normal study.         Past Surgical History   Procedure  Laterality  Date   .  Total knee arthroplasty           left   .  Knee arthroscopy           right- Brian Mccullough   .  Inguinal hernia repair    1958   .  Tonsillectomy and adenoidectomy    1960         Family History   Problem  Relation  Age of Onset   .  Hypertension  Other     .  Hypertension  Mother     .  Diabetes  Mother     .  Diabetes  Father          Social History History   Substance Use Topics   .  Smoking status:  Former Research scientist (life sciences)   .  Smokeless tobacco:  Never Used   .  Alcohol Use:  No         Comment: It was up to 2 gallon of Vodka/week.  8 wks. off ETOH since 7/9         Allergies   Allergen  Reactions   .  Codeine  Nausea Only       REACTION: nausea         Current Outpatient Prescriptions   Medication  Sig  Dispense  Refill   .  amLODipine (NORVASC) 5 MG tablet  Take 5 mg by mouth daily.         Marland Kitchen  aspirin 81 MG EC tablet  Take 81 mg by mouth daily.           .  benazepril (LOTENSIN) 40 MG tablet  Take 40 mg by mouth daily.         .  Cholecalciferol (VITAMIN D3) 1000 UNITS tablet  Take 1,000 Units by mouth daily.           .  clotrimazole-betamethasone (LOTRISONE) cream  Apply 1 application topically 2 (two) times daily as needed (yeast).         .  COCONUT OIL PO  Take 1 tablet by mouth daily.         .  cyanocobalamin 100 MCG tablet  Take 100 mcg by mouth daily.           .  furosemide (LASIX) 40 MG tablet  Take 1 tablet (40 mg total) by mouth every other day.   30 tablet   5   .  ibuprofen (ADVIL,MOTRIN) 600 MG tablet  Take 1 tablet (600 mg total) by mouth 2 (two) times daily as needed for pain.   60  tablet   1   .  meloxicam (MOBIC) 15 MG tablet  Take 1 tablet (15 mg total) by mouth daily.   90 tablet   2   .  metFORMIN (GLUCOPHAGE) 500 MG tablet  Take 500 mg by mouth daily.         .  metoprolol tartrate (LOPRESSOR) 25 MG tablet  Take 1-2 tablets (25-50 mg total) by mouth 2 (two) times daily as needed (palpitations).   60 tablet   3   .  potassium chloride SA (K-DUR,KLOR-CON) 20 MEQ tablet  Take 20 mEq by mouth daily.         Marland Kitchen  tiZANidine (ZANAFLEX) 4 MG tablet  Take 4 mg by mouth every 8 (eight) hours as needed for muscle spasms.         .  traMADol (ULTRAM) 50 MG tablet  Take 1 tablet (50 mg total) by mouth every 6 (six) hours as needed for moderate pain.   30 tablet   0   .  triamcinolone (KENALOG) 0.1 % ointment  Apply 1 application topically 2 (two) times daily as needed (rash).  Current Facility-Administered Medications   Medication  Dose  Route  Frequency  Provider  Last Rate  Last Dose   .  testosterone cypionate (DEPOTESTOTERONE CYPIONATE) injection 200 mg   200 mg  Intramuscular  Q14 Days  Brian Anger, Mccullough              Review of Systems   Constitutional: Negative for fever, chills and unexpected weight change.  HENT: Negative for congestion, hearing loss, sore throat, trouble swallowing and voice change.   Eyes: Negative for visual disturbance.  Respiratory: Negative for cough and wheezing.   Cardiovascular: Negative for chest pain, palpitations and leg swelling.  Gastrointestinal: Negative for nausea, vomiting, abdominal pain, diarrhea, constipation, blood in stool, abdominal distention, anal bleeding and rectal pain.  Genitourinary: Negative for hematuria and difficulty urinating.  Musculoskeletal: Positive for joint swelling and myalgias. Negative for arthralgias.  Skin: Negative for rash and wound.  Neurological: Negative for seizures, syncope, weakness and headaches.  Hematological: Negative for adenopathy. Does not bruise/bleed easily.   Psychiatric/Behavioral: Negative for confusion. The patient is nervous/anxious.       Blood pressure 134/84, pulse 80, temperature 98 F (36.7 C), temperature source Oral, resp. rate 16, height 6\' 3"  (1.905 m), weight 293 lb 12.8 oz (133.267 kg).   Physical Exam   Constitutional: He is oriented to person, place, and time. He appears well-developed and well-nourished. No distress.  HENT:   Head: Normocephalic.   Nose: Nose normal.   Mouth/Throat: No oropharyngeal exudate.  Eyes: Conjunctivae and EOM are normal. Pupils are equal, round, and reactive to light. Right eye exhibits no discharge. Left eye exhibits no discharge. No scleral icterus.  Neck: Normal range of motion. Neck supple. No JVD present. No tracheal deviation present. No thyromegaly present.  Cardiovascular: Normal rate, regular rhythm, normal heart sounds and intact distal pulses.    No murmur heard. Pulmonary/Chest: Effort normal and breath sounds normal. No stridor. No respiratory distress. He has no wheezes. He has no rales. He exhibits no tenderness.  Abdominal: Soft. Bowel sounds are normal. He exhibits no distension and no mass. There is no tenderness. There is no rebound and no guarding.  Somewhat obese. Umbilicus normal. Liver not enlarged.  Genitourinary:  Medium size right inguinal hernia, a little bit tender. Partially reducible. No scrotal mass. Smaller left inguinal hernia, but obvious.  Musculoskeletal: Normal range of motion. He exhibits no edema and no tenderness.  Lymphadenopathy:    He has no cervical adenopathy.  Neurological: He is alert and oriented to person, place, and time. He has normal reflexes. Coordination normal.  Skin: Skin is warm and dry. No rash noted. He is not diaphoretic. No erythema. No pallor.  Psychiatric: He has a normal mood and affect. His behavior is normal. Judgment and thought content normal.      Data Reviewed [facility] our office notes. ED notes. Lab work.   Assessment     Partial incarcerated right inguinal hernia without compromise bowel   Recurrent left inguinal hernia, becoming symptomatic   Obesity   Hypertension   Type 2 diabetes      Plan    He would like to have these hernias repaired, which I think is very reasonable considering his symptoms. He wants both repaired simultaneously   We'll schedule for open repair of bilateral inguinal hernias with mesh. I am reluctant to attempt this laparoscopically since the right side seems partially incarcerated.   I discussed the indications, details, techniques, numerous risk of the surgery  with him. He is aware of the risk of bleeding, infection, nerve damage chronic pain, recurrence, injury to adjacent organs his intestine or bladder .  All of his questions are answered. He understands all these issues. He agrees with this plan.         Edsel Petrin. Dalbert Batman, M.D., [facility] General and Minimally invasive Surgery Breast and Colorectal Surgery Office:   [phone] Pager:   [phone]

## 2014-02-03 NOTE — Telephone Encounter (Signed)
Relevant patient education mailed to patient.  

## 2014-02-04 ENCOUNTER — Telehealth (INDEPENDENT_AMBULATORY_CARE_PROVIDER_SITE_OTHER): Payer: Self-pay

## 2014-02-04 NOTE — Telephone Encounter (Signed)
Brian Mccullough from [facility] surgery called states she was asking if Dr Dalbert Batman still wanted a CMET on this pt. Pt had a BMET drawn on 02/04/14. Call back # for [facility] [phone]. Please advise

## 2014-02-04 NOTE — Pre-Procedure Instructions (Signed)
Office will send a message to Dr. Dalbert Batman to clarify CMET order; pt. just had a BMET 02/02/2014 - does he need a CMET pre-op?

## 2014-02-05 ENCOUNTER — Encounter (HOSPITAL_BASED_OUTPATIENT_CLINIC_OR_DEPARTMENT_OTHER)
Admission: RE | Admit: 2014-02-05 | Discharge: 2014-02-05 | Disposition: A | Discharge status: Home / Self Care | Payer: Medicare Other | Source: Ambulatory Visit | Attending: General Surgery | Admitting: General Surgery

## 2014-02-05 LAB — URINALYSIS, ROUTINE W REFLEX MICROSCOPIC
GLUCOSE, UA: NEGATIVE mg/dL
Ketones, ur: 15 mg/dL — AB
Nitrite: NEGATIVE
PH: 5.5 (ref 5.0–8.0)
Protein, ur: 30 mg/dL — AB
SPECIFIC GRAVITY, URINE: 1.026 (ref 1.005–1.030)
Urobilinogen, UA: 2 mg/dL — ABNORMAL HIGH (ref 0.0–1.0)

## 2014-02-05 LAB — CBC WITH DIFFERENTIAL/PLATELET
BASOS PCT: 1 % (ref 0–1)
Basophils Absolute: 0.1 10*3/uL (ref 0.0–0.1)
EOS PCT: 3 % (ref 0–5)
Eosinophils Absolute: 0.3 10*3/uL (ref 0.0–0.7)
HEMATOCRIT: 40.7 % (ref 39.0–52.0)
HEMOGLOBIN: 14.6 g/dL (ref 13.0–17.0)
LYMPHS PCT: 26 % (ref 12–46)
Lymphs Abs: 2.5 10*3/uL (ref 0.7–4.0)
MCH: 31.4 pg (ref 26.0–34.0)
MCHC: 35.9 g/dL (ref 30.0–36.0)
MCV: 87.5 fL (ref 78.0–100.0)
MONOS PCT: 12 % (ref 3–12)
Monocytes Absolute: 1.2 10*3/uL — ABNORMAL HIGH (ref 0.1–1.0)
Neutro Abs: 5.5 10*3/uL (ref 1.7–7.7)
Neutrophils Relative %: 58 % (ref 43–77)
Platelets: 300 10*3/uL (ref 150–400)
RBC: 4.65 MIL/uL (ref 4.22–5.81)
RDW: 12.5 % (ref 11.5–15.5)
WBC: 9.6 10*3/uL (ref 4.0–10.5)

## 2014-02-05 LAB — URINE MICROSCOPIC-ADD ON

## 2014-02-09 ENCOUNTER — Ambulatory Visit (HOSPITAL_BASED_OUTPATIENT_CLINIC_OR_DEPARTMENT_OTHER)
Admission: RE | Admit: 2014-02-09 | Discharge: 2014-02-09 | Disposition: A | Discharge status: Home / Self Care | Payer: Medicare Other | Source: Ambulatory Visit | Attending: General Surgery | Admitting: General Surgery

## 2014-02-09 ENCOUNTER — Telehealth (INDEPENDENT_AMBULATORY_CARE_PROVIDER_SITE_OTHER): Payer: Self-pay

## 2014-02-09 ENCOUNTER — Encounter (HOSPITAL_BASED_OUTPATIENT_CLINIC_OR_DEPARTMENT_OTHER): Payer: Self-pay

## 2014-02-09 ENCOUNTER — Encounter (HOSPITAL_BASED_OUTPATIENT_CLINIC_OR_DEPARTMENT_OTHER)
Admission: RE | Disposition: A | Discharge status: Home / Self Care | Payer: Self-pay | Source: Ambulatory Visit | Attending: General Surgery

## 2014-02-09 ENCOUNTER — Encounter (HOSPITAL_BASED_OUTPATIENT_CLINIC_OR_DEPARTMENT_OTHER): Payer: Medicare Other | Admitting: Anesthesiology

## 2014-02-09 ENCOUNTER — Ambulatory Visit (HOSPITAL_BASED_OUTPATIENT_CLINIC_OR_DEPARTMENT_OTHER): Payer: Medicare Other | Admitting: Anesthesiology

## 2014-02-09 DIAGNOSIS — Z6835 Body mass index (BMI) 35.0-35.9, adult: Secondary | ICD-10-CM | POA: Insufficient documentation

## 2014-02-09 DIAGNOSIS — K4091 Unilateral inguinal hernia, without obstruction or gangrene, recurrent: Secondary | ICD-10-CM

## 2014-02-09 DIAGNOSIS — E669 Obesity, unspecified: Secondary | ICD-10-CM | POA: Insufficient documentation

## 2014-02-09 DIAGNOSIS — E119 Type 2 diabetes mellitus without complications: Secondary | ICD-10-CM | POA: Insufficient documentation

## 2014-02-09 DIAGNOSIS — I10 Essential (primary) hypertension: Secondary | ICD-10-CM | POA: Insufficient documentation

## 2014-02-09 DIAGNOSIS — K4030 Unilateral inguinal hernia, with obstruction, without gangrene, not specified as recurrent: Secondary | ICD-10-CM

## 2014-02-09 DIAGNOSIS — K4090 Unilateral inguinal hernia, without obstruction or gangrene, not specified as recurrent: Secondary | ICD-10-CM | POA: Diagnosis present

## 2014-02-09 DIAGNOSIS — M1990 Unspecified osteoarthritis, unspecified site: Secondary | ICD-10-CM | POA: Insufficient documentation

## 2014-02-09 HISTORY — PX: INSERTION OF MESH: SHX5868

## 2014-02-09 HISTORY — PX: INGUINAL HERNIA REPAIR: SHX194

## 2014-02-09 HISTORY — DX: Unilateral inguinal hernia, without obstruction or gangrene, not specified as recurrent: K40.90

## 2014-02-09 HISTORY — DX: Type 2 diabetes mellitus without complications: E11.9

## 2014-02-09 HISTORY — DX: Other diseases of pharynx: J39.2

## 2014-02-09 HISTORY — DX: Palpitations: R00.2

## 2014-02-09 LAB — GLUCOSE, CAPILLARY
Glucose-Capillary: 95 mg/dL (ref 70–99)
Glucose-Capillary: 115 mg/dL — ABNORMAL HIGH (ref 70–99)

## 2014-02-09 LAB — POCT HEMOGLOBIN-HEMACUE: HEMOGLOBIN: 14.6 g/dL (ref 13.0–17.0)

## 2014-02-09 SURGERY — REPAIR, HERNIA, INGUINAL, BILATERAL, ADULT
Anesthesia: General | Site: Groin | Laterality: Bilateral

## 2014-02-09 MED ORDER — OXYCODONE-ACETAMINOPHEN 7.5-325 MG PO TABS: 1.0000 | ORAL_TABLET | ORAL | Status: DC | PRN

## 2014-02-09 MED ORDER — DEXTROSE 5 % IV SOLN: 3.0000 g | INTRAVENOUS | Status: DC

## 2014-02-09 MED ORDER — SUCCINYLCHOLINE CHLORIDE 20 MG/ML IJ SOLN
INTRAMUSCULAR | Status: DC | PRN
  Administered 2014-02-09: 50 mg via INTRAVENOUS

## 2014-02-09 MED ORDER — FENTANYL CITRATE 0.05 MG/ML IJ SOLN
INTRAMUSCULAR | Status: DC | PRN
  Administered 2014-02-09: 100 ug via INTRAVENOUS
  Administered 2014-02-09: 50 ug via INTRAVENOUS
  Administered 2014-02-09: 100 ug via INTRAVENOUS

## 2014-02-09 MED ORDER — FENTANYL CITRATE 0.05 MG/ML IJ SOLN
INTRAMUSCULAR | Status: AC
  Filled 2014-02-09: qty 6

## 2014-02-09 MED ORDER — BUPIVACAINE-EPINEPHRINE (PF) 0.5% -1:200000 IJ SOLN
INTRAMUSCULAR | Status: AC
  Filled 2014-02-09: qty 60

## 2014-02-09 MED ORDER — FENTANYL CITRATE 0.05 MG/ML IJ SOLN: 50.0000 ug | INTRAMUSCULAR | Status: DC | PRN

## 2014-02-09 MED ORDER — MIDAZOLAM HCL 2 MG/ML PO SYRP: 12.0000 mg | ORAL_SOLUTION | Freq: Once | ORAL | Status: DC | PRN

## 2014-02-09 MED ORDER — MIDAZOLAM HCL 5 MG/5ML IJ SOLN
INTRAMUSCULAR | Status: DC | PRN
Start: 2014-02-09 — End: 2014-02-09
  Administered 2014-02-09: 2 mg via INTRAVENOUS

## 2014-02-09 MED ORDER — PROPOFOL 10 MG/ML IV BOLUS
INTRAVENOUS | Status: DC | PRN
  Administered 2014-02-09: 300 mg via INTRAVENOUS

## 2014-02-09 MED ORDER — OXYCODONE HCL 5 MG/5ML PO SOLN: 5.0000 mg | Freq: Once | ORAL | Status: AC | PRN

## 2014-02-09 MED ORDER — BUPIVACAINE-EPINEPHRINE 0.5% -1:200000 IJ SOLN
INTRAMUSCULAR | Status: DC | PRN
  Administered 2014-02-09: 30 mL

## 2014-02-09 MED ORDER — PHENYLEPHRINE HCL 10 MG/ML IJ SOLN
INTRAMUSCULAR | Status: DC | PRN
  Administered 2014-02-09: 100 ug via INTRAVENOUS

## 2014-02-09 MED ORDER — ONDANSETRON HCL 4 MG/2ML IJ SOLN
INTRAMUSCULAR | Status: DC | PRN
  Administered 2014-02-09: 4 mg via INTRAVENOUS

## 2014-02-09 MED ORDER — HYDROMORPHONE HCL PF 1 MG/ML IJ SOLN: 0.2500 mg | INTRAMUSCULAR | Status: DC | PRN

## 2014-02-09 MED ORDER — ACETAMINOPHEN 10 MG/ML IV SOLN
INTRAVENOUS | Status: DC | PRN
  Administered 2014-02-09: 1000 mg via INTRAVENOUS

## 2014-02-09 MED ORDER — MIDAZOLAM HCL 2 MG/2ML IJ SOLN
INTRAMUSCULAR | Status: AC
  Filled 2014-02-09: qty 2

## 2014-02-09 MED ORDER — DEXTROSE 5 % IV SOLN
3.0000 g | INTRAVENOUS | Status: DC | PRN
  Administered 2014-02-09: 3 g via INTRAVENOUS

## 2014-02-09 MED ORDER — MIDAZOLAM HCL 2 MG/2ML IJ SOLN: 1.0000 mg | INTRAMUSCULAR | Status: DC | PRN

## 2014-02-09 MED ORDER — CEFAZOLIN SODIUM 1-5 GM-% IV SOLN
INTRAVENOUS | Status: AC
  Filled 2014-02-09: qty 50

## 2014-02-09 MED ORDER — OXYCODONE HCL 5 MG PO TABS
5.0000 mg | ORAL_TABLET | Freq: Once | ORAL | Status: AC | PRN
  Administered 2014-02-09: 5 mg via ORAL

## 2014-02-09 MED ORDER — LIDOCAINE HCL (CARDIAC) 20 MG/ML IV SOLN
INTRAVENOUS | Status: DC | PRN
  Administered 2014-02-09: 50 mg via INTRAVENOUS

## 2014-02-09 MED ORDER — ROCURONIUM BROMIDE 100 MG/10ML IV SOLN
INTRAVENOUS | Status: DC | PRN
  Administered 2014-02-09: 20 mg via INTRAVENOUS

## 2014-02-09 MED ORDER — ACETAMINOPHEN 10 MG/ML IV SOLN
INTRAVENOUS | Status: AC
  Filled 2014-02-09: qty 100

## 2014-02-09 MED ORDER — GLYCOPYRROLATE 0.2 MG/ML IJ SOLN
INTRAMUSCULAR | Status: DC | PRN
  Administered 2014-02-09: 0.2 mg via INTRAVENOUS

## 2014-02-09 MED ORDER — CEFAZOLIN SODIUM-DEXTROSE 2-3 GM-% IV SOLR
INTRAVENOUS | Status: AC
  Filled 2014-02-09: qty 50

## 2014-02-09 MED ORDER — LACTATED RINGERS IV SOLN
INTRAVENOUS | Status: DC
  Administered 2014-02-09 (×2): via INTRAVENOUS

## 2014-02-09 MED ORDER — ONDANSETRON HCL 4 MG/2ML IJ SOLN: 4.0000 mg | Freq: Once | INTRAMUSCULAR | Status: DC | PRN

## 2014-02-09 MED ORDER — OXYCODONE HCL 5 MG PO TABS
ORAL_TABLET | ORAL | Status: AC
  Filled 2014-02-09: qty 1

## 2014-02-09 MED ORDER — CHLORHEXIDINE GLUCONATE 4 % EX LIQD: 1.0000 "application " | Freq: Once | CUTANEOUS | Status: DC

## 2014-02-09 SURGICAL SUPPLY — 62 items
ADH SKN CLS APL DERMABOND .7 (GAUZE/BANDAGES/DRESSINGS) ×1
APL SKNCLS STERI-STRIP NONHPOA (GAUZE/BANDAGES/DRESSINGS) ×1
BENZOIN TINCTURE PRP APPL 2/3 (GAUZE/BANDAGES/DRESSINGS) ×3 IMPLANT
BLADE 10 SAFETY STRL DISP (BLADE) ×3 IMPLANT
BLADE HEX COATED 2.75 (ELECTRODE) ×3 IMPLANT
BLADE SURG 10 STRL SS (BLADE) ×2 IMPLANT
BLADE SURG 15 STRL LF DISP TIS (BLADE) IMPLANT
BLADE SURG 15 STRL SS (BLADE) ×3
BLADE SURG ROTATE 9660 (MISCELLANEOUS) ×2 IMPLANT
CANISTER SUCT 1200ML W/VALVE (MISCELLANEOUS) ×3 IMPLANT
CLOSURE WOUND 1/2 X4 (GAUZE/BANDAGES/DRESSINGS) ×1
COVER MAYO STAND STRL (DRAPES) ×3 IMPLANT
COVER TABLE BACK 60X90 (DRAPES) ×3 IMPLANT
DECANTER SPIKE VIAL GLASS SM (MISCELLANEOUS) IMPLANT
DERMABOND ADVANCED (GAUZE/BANDAGES/DRESSINGS) ×2
DERMABOND ADVANCED .7 DNX12 (GAUZE/BANDAGES/DRESSINGS) IMPLANT
DRAIN PENROSE 1/2X12 LTX STRL (WOUND CARE) ×3 IMPLANT
DRAPE LAPAROTOMY TRNSV 102X78 (DRAPE) ×2 IMPLANT
DRAPE PED LAPAROTOMY (DRAPES) ×1 IMPLANT
ELECT REM PT RETURN 9FT ADLT (ELECTROSURGICAL) ×3
ELECTRODE REM PT RTRN 9FT ADLT (ELECTROSURGICAL) ×1 IMPLANT
GLOVE BIO SURGEON STRL SZ7 (GLOVE) ×2 IMPLANT
GLOVE EUDERMIC 7 POWDERFREE (GLOVE) ×3 IMPLANT
GLOVE EXAM NITRILE EXT CUFF MD (GLOVE) ×2 IMPLANT
GOWN STRL REUS W/ TWL LRG LVL3 (GOWN DISPOSABLE) ×1 IMPLANT
GOWN STRL REUS W/ TWL XL LVL3 (GOWN DISPOSABLE) ×1 IMPLANT
GOWN STRL REUS W/TWL LRG LVL3 (GOWN DISPOSABLE) ×3
GOWN STRL REUS W/TWL XL LVL3 (GOWN DISPOSABLE) ×3
MESH ULTRAPRO 3X6 7.6X15CM (Mesh General) ×4 IMPLANT
NDL HYPO 25X1 1.5 SAFETY (NEEDLE) ×1 IMPLANT
NEEDLE HYPO 22GX1.5 SAFETY (NEEDLE) ×2 IMPLANT
NEEDLE HYPO 25X1 1.5 SAFETY (NEEDLE) ×3 IMPLANT
NS IRRIG 1000ML POUR BTL (IV SOLUTION) ×3 IMPLANT
PACK BASIN DAY SURGERY FS (CUSTOM PROCEDURE TRAY) ×3 IMPLANT
PENCIL BUTTON HOLSTER BLD 10FT (ELECTRODE) ×3 IMPLANT
SLEEVE SCD COMPRESS KNEE MED (MISCELLANEOUS) ×2 IMPLANT
SPONGE GAUZE 4X4 12PLY STER LF (GAUZE/BANDAGES/DRESSINGS) IMPLANT
SPONGE LAP 4X18 X RAY DECT (DISPOSABLE) ×2 IMPLANT
STAPLER VISISTAT 35W (STAPLE) IMPLANT
STRIP CLOSURE SKIN 1/2X4 (GAUZE/BANDAGES/DRESSINGS) ×2 IMPLANT
SUT MNCRL AB 4-0 PS2 18 (SUTURE) ×5 IMPLANT
SUT PROLENE 1 CT (SUTURE) IMPLANT
SUT PROLENE 2 0 CT2 30 (SUTURE) ×18 IMPLANT
SUT SILK 2 0 SH (SUTURE) IMPLANT
SUT SILK 2 0 TIES 17X18 (SUTURE) ×3
SUT SILK 2-0 18XBRD TIE BLK (SUTURE) ×1 IMPLANT
SUT SILK 3 0 SH 30 (SUTURE) IMPLANT
SUT VIC AB 2-0 CT1 27 (SUTURE)
SUT VIC AB 2-0 CT1 TAPERPNT 27 (SUTURE) IMPLANT
SUT VIC AB 2-0 SH 27 (SUTURE) ×12
SUT VIC AB 2-0 SH 27XBRD (SUTURE) ×1 IMPLANT
SUT VIC AB 3-0 54X BRD REEL (SUTURE) IMPLANT
SUT VIC AB 3-0 BRD 54 (SUTURE)
SUT VIC AB 3-0 FS2 27 (SUTURE) IMPLANT
SUT VIC AB 3-0 SH 27 (SUTURE) ×6
SUT VIC AB 3-0 SH 27X BRD (SUTURE) ×1 IMPLANT
SUT VICRYL 3-0 CR8 SH (SUTURE) ×4 IMPLANT
SYR CONTROL 10ML LL (SYRINGE) ×3 IMPLANT
TRAY DSU PREP LF (CUSTOM PROCEDURE TRAY) ×3 IMPLANT
TUBE CONNECTING 20'X1/4 (TUBING) ×1
TUBE CONNECTING 20X1/4 (TUBING) ×2 IMPLANT
YANKAUER SUCT BULB TIP NO VENT (SUCTIONS) ×3 IMPLANT

## 2014-02-09 NOTE — Op Note (Signed)
Patient Name:           Brian Mccullough   Date of Surgery:        02/09/2014  Pre op Diagnosis:      Partially incarcerated right inguinal hernia, recurrent left inguinal hernia  Post op Diagnosis:    Same  Procedure:                 Upper appeared incarcerated right inguinal hernia with mesh, open repair recurrent left inguinal hernia with mesh  Karl Pock repair)  Surgeon:                     Edsel Petrin. Dalbert Batman, M.D., FACS  Assistant:                      None  Operative Indications:   Brian Mccullough is a 63 y.o. male. He has seen Dr. Alain Marion and Dr. Marily Memos for right groin pain. The patient states that he had Left inguinal hernia repair at age 35. The past 9 months she's noticed progressive pain in his right groin after he walks. Initial evaluation with Dr. Alain Marion failed to reveal the hernia. Evaluation by Marily Memos failed to reveal any bony or joint abnormalities. He says it is now obvious that he has a bulge. No abdominal pain nausea or vomiting. The pain was apparently pretty bad this past weekend he went to the emergency room. Apparently saw Dr. Elwin Mocha who reduced a right inguinal hernia. Since he already had an appointment with me he recommended that he keep that appointment for definitive surgical management.  More recently, he states that he's noticed some stinging discomfort in his left groin.  Examination reveals a moderate sized right inguinal hernia that is only partially reducible. He has an obvious but smaller hernia on the left. Past history significant for obesity. Hypertension on Lopressor and Lasix and aspirin. Type 2 diabetes. 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 Nonsmoker.   Operative Findings:       On the right side the patient had a large indirect hernia with a large sac that was reducible and had no incarcerated contents or sliding component. On the left there was intense chronic scar tissue and he had a large but  reducible sliding indirect left inguinal hernia. There was no evidence of direct hernia on either side.  Procedure in Detail:          Following the induction of general endotracheal anesthesia a surgical time out was performed. The lower abdomen groins and genitalia and upper thighs were prepped and draped in a sterile fashion. Intravenous antibiotics were given. 0.5% Marcaine with epinephrine was used as local infiltration anesthetic bilaterally.  I will essentially dictate the procedure once, noting differences in technique on each side since the repair was essentially the same.  Bilateral transverse incisions were made overlying the right and left inguinal canals. Dissection was carried down through the subcutaneous tissue and Scarpa's fascia. On the right side exposure of the external oblique and external inguinal ring was rather straightforward and the external oblique was incised in the direction of its fibers opening up the external ring, dissected away from the underlying tissues and  retractors were placed. On the left the dissection was somewhat more tedious because of intense scar tissue but ultimately were able to divide the external oblique  off of the cord structures and muscle layers and dissected down inferiorly exposing the inguinal ligament and  then dissecting it up above that as well. There was no evidence of mesh on the left. On each side we isolated the cord structures and controlled with a Penrose drain. On each side we skeletonized the cremasteric muscle fibers. On each side there was a large indirect hernia sac. The right side this was a simple  sac and on the left this was a complex sliding  hernia. On the right side we opened the sac and inspected it and then closed with a pursestring suture of 2-0 silk at the level of the internal ring and then amputated the redundant sac. On the left we simply dissected the indirect sac away from the cord structures making sure of the  the anatomy  and simply reduced it and then tightened the internal ring with figure-of-eight sutures of 2-0 Vicryl. On each side we  repaired and reinforced the repair with a 3" x 6" piece of UltraPro  mesh. The mesh was trimmed slightly to accommodate the anatomy the wound and sutured in place with running sutures of 2-0 Prolene and interrupted sutures of 2-0 Prolene. On the right side sutured the mesh so as  to overlap the pubic tubercle generously and then the running suture inferiorly along the inguinal ligament. On the left-side I used interrupted sutures along the inguinal ligament inferiorly. On both sides I placed interrupted mattress sutures of 2-0 Prolene medially, superiorly and superolaterally to position and secure the mesh. On each side I incised the mesh laterally so as to wrap around the cord structures at the internal ring. On each side I overlapped tails of the mesh laterally and then placed further sutures laterally. This provided very secure repair both medial and lateral to the internal ring but allowed adequate fingertip opening for the cord structures. There was no bleeding. The wounds were irrigated with saline. The external oblique was closed bilaterally with running sutures of 2-0 Vicryl. Scarpa's fascia was closed bilaterally with 3-0 Vicryl sutures and the skin was closed bilaterally with running subcuticular suture of 4-0 Monocryl and Dermabond. The patient tolerated the procedure well was taken to PACU in stable condition. EBL 25 cc or less.  Counts correct. Complications none.       Edsel Petrin. Dalbert Batman, M.D., FACS General and Minimally Invasive Surgery Breast and Colorectal Surgery  02/09/2014 9:50 AM

## 2014-02-09 NOTE — Transfer of Care (Signed)
Immediate Anesthesia Transfer of Care Note  Patient: Brian Mccullough  Procedure(s) Performed: Procedure(s): OPEN BILATERAL INGUINAL HERNIA REPAIR WITH MESH (Bilateral) INSERTION OF MESH (Bilateral)  Patient Location: PACU  Anesthesia Type:General  Level of Consciousness: sedated  Airway & Oxygen Therapy: Patient Spontanous Breathing and Patient connected to face mask oxygen  Post-op Assessment: Report given to PACU RN and Post -op Vital signs reviewed and stable  Post vital signs: Reviewed and stable  Complications: No apparent anesthesia complications

## 2014-02-09 NOTE — Anesthesia Procedure Notes (Signed)
Procedure Name: Intubation Date/Time: 02/09/2014 7:43 AM Performed by: Marrianne Mood Pre-anesthesia Checklist: Patient identified, Emergency Drugs available, Suction available, Patient being monitored and Timeout performed Patient Re-evaluated:Patient Re-evaluated prior to inductionOxygen Delivery Method: Circle System Utilized Preoxygenation: Pre-oxygenation with 100% oxygen Intubation Type: IV induction Ventilation: Mask ventilation without difficulty Laryngoscope Size: Miller and 3 Grade View: Grade IV Tube type: Oral Tube size: 8.0 mm Number of attempts: 1 Airway Equipment and Method: stylet and oral airway Placement Confirmation: ETT inserted through vocal cords under direct vision,  positive ETCO2 and breath sounds checked- equal and bilateral Secured at: 21 cm Tube secured with: Tape Dental Injury: Teeth and Oropharynx as per pre-operative assessment  Comments: Very poor dentition with loose and missing teeth throughout.  All teeth in pre-op condition after meticulously careful and gentle intubation.

## 2014-02-09 NOTE — Anesthesia Preprocedure Evaluation (Signed)
Anesthesia Evaluation  Patient identified by MRN, date of birth, ID band Patient awake    Reviewed: Allergy & Precautions, H&P , NPO status   Airway Mallampati: I TM Distance: >3 FB Neck ROM: Full    Dental  (+) Teeth Intact, Dental Advisory Given   Pulmonary Current Smoker,  breath sounds clear to auscultation        Cardiovascular hypertension, Pt. on medications Rhythm:Regular Rate:Normal     Neuro/Psych    GI/Hepatic   Endo/Other  diabetes, Well Controlled, Type 2, Oral Hypoglycemic AgentsMorbid obesity  Renal/GU      Musculoskeletal   Abdominal   Peds  Hematology   Anesthesia Other Findings   Reproductive/Obstetrics                           Anesthesia Physical Anesthesia Plan  ASA: III  Anesthesia Plan: General   Post-op Pain Management:    Induction: Intravenous  Airway Management Planned: Oral ETT  Additional Equipment:   Intra-op Plan:   Post-operative Plan: Extubation in OR  Informed Consent: I have reviewed the patients History and Physical, chart, labs and discussed the procedure including the risks, benefits and alternatives for the proposed anesthesia with the patient or authorized representative who has indicated his/her understanding and acceptance.   Dental advisory given  Plan Discussed with: CRNA, Anesthesiologist and Surgeon  Anesthesia Plan Comments:         Anesthesia Quick Evaluation

## 2014-02-09 NOTE — Discharge Instructions (Signed)
[facility] _______[facility] ° °UMBILICAL OR INGUINAL HERNIA REPAIR: POST OP INSTRUCTIONS ° °Always review your discharge instruction sheet given to you by the facility where your surgery was performed. °IF YOU HAVE DISABILITY OR FAMILY LEAVE FORMS, YOU MUST BRING THEM TO THE OFFICE FOR PROCESSING.   °DO NOT GIVE THEM TO YOUR DOCTOR. ° °1. A  prescription for pain medication may be given to you upon discharge.  Take your pain medication as prescribed, if needed.  If narcotic pain medicine is not needed, then you may take acetaminophen (Tylenol) or ibuprofen (Advil) as needed. °2. Take your usually prescribed medications unless otherwise directed. °3. If you need a refill on your pain medication, please contact your pharmacy.  They will contact our office to request authorization. Prescriptions will not be filled after 5 pm or on week-ends. °4. You should follow a light diet the first 24 hours after arrival home, such as soup and crackers, etc.  Be sure to include lots of fluids daily.  Resume your normal diet the day after surgery. °5. Most patients will experience some swelling and bruising around the umbilicus or in the groin and scrotum.  Ice packs and reclining will help.  Swelling and bruising can take several days to resolve.  °6. It is common to experience some constipation if taking pain medication after surgery.  Increasing fluid intake and taking a stool softener (such as Colace) will usually help or prevent this problem from occurring.  A mild laxative (Milk of Magnesia or Miralax) should be taken according to package directions if there are no bowel movements after 48 hours. °7. Unless discharge instructions indicate otherwise, you may remove your bandages 24-48 hours after surgery, and you may shower at that time.  You may have steri-strips (small skin tapes) in place directly over the incision.  These strips should be left on the skin for 7-10 days.  If your surgeon used skin glue on the  incision, you may shower in 24 hours.  The glue will flake off over the next 2-3 weeks.  Any sutures or staples will be removed at the office during your follow-up visit. °8. ACTIVITIES:  You may resume regular (light) daily activities beginning the next day--such as daily self-care, walking, climbing stairs--gradually increasing activities as tolerated.  You may have sexual intercourse when it is comfortable.  Refrain from any heavy lifting or straining until approved by your doctor. °a. You may drive when you are no longer taking prescription pain medication, you can comfortably wear a seatbelt, and you can safely maneuver your car and apply brakes. °b. RETURN TO WORK:  __________________________________________________________ °9. You should see your doctor in the office for a follow-up appointment approximately 2-3 weeks after your surgery.  Make sure that you call for this appointment within a day or two after you arrive home to insure a convenient appointment time. °10. OTHER INSTRUCTIONS:  __________________________________________________________________________________________________________________________________________________________________________________________  °WHEN TO CALL YOUR DOCTOR: °1. Fever over 101.0 °2. Inability to urinate °3. Nausea and/or vomiting °4. Extreme swelling or bruising °5. Continued bleeding from incision. °6. Increased pain, redness, or drainage from the incision ° °The clinic staff is available to answer your questions during regular business hours.  Please don’t hesitate to call and ask to speak to one of the nurses for clinical concerns.  If you have a medical emergency, go to the nearest emergency room or call 911.  A surgeon from [facility] is always on call at the hospital ° ° °  [address], [address], [place], [place]  [address] ? ° [address] [address], [place], [place]   [address] °[phone] ? [phone] ? FAX [phone] °Web site:  [email or web address] ° ° ° °Post Anesthesia Home Care Instructions ° °Activity: °Get plenty of rest for the remainder of the day. A responsible adult should stay with you for 24 hours following the procedure.  °For the next 24 hours, DO NOT: °-Drive a car °-Operate machinery °-Drink alcoholic beverages °-Take any medication unless instructed by your physician °-Make any legal decisions or sign important papers. ° °Meals: °Start with liquid foods such as gelatin or soup. Progress to regular foods as tolerated. Avoid greasy, spicy, heavy foods. If nausea and/or vomiting occur, drink only clear liquids until the nausea and/or vomiting subsides. Call your physician if vomiting continues. ° °Special Instructions/Symptoms: °Your throat may feel dry or sore from the anesthesia or the breathing tube placed in your throat during surgery. If this causes discomfort, gargle with warm salt water. The discomfort should disappear within 24 hours. ° °

## 2014-02-09 NOTE — Interval H&P Note (Signed)
History and Physical Interval Note:  02/09/2014 7:10 AM  Brian Mccullough  has presented today for surgery, with the diagnosis of bilateral ingunial hernia repairs  The various methods of treatment have been discussed with the patient and family. After consideration of risks, benefits and other options for treatment, the patient has consented to  Procedure(s): BILATERAL OPEN BILATERAL INGUINAL HERNIA REPAIR WITH MESH (Bilateral) INSERTION OF MESH (Bilateral) as a surgical intervention .  The patient's history has been reviewed, patient examined, no change in status, stable for surgery.  I have reviewed the patient's chart and labs.  Questions were answered to the patient's satisfaction.     [name]

## 2014-02-09 NOTE — Anesthesia Postprocedure Evaluation (Signed)
  Anesthesia Post-op Note  Patient: Brian Mccullough  Procedure(s) Performed: Procedure(s): OPEN BILATERAL INGUINAL HERNIA REPAIR WITH MESH (Bilateral) INSERTION OF MESH (Bilateral)  Patient Location: PACU  Anesthesia Type:General  Level of Consciousness: awake, alert , oriented and patient cooperative  Airway and Oxygen Therapy: Patient Spontanous Breathing  Post-op Pain: mild  Post-op Assessment: Post-op Vital signs reviewed, Patient's Cardiovascular Status Stable, Respiratory Function Stable, Patent Airway, No signs of Nausea or vomiting and Pain level controlled  Post-op Vital Signs: stable  Last Vitals:  Filed Vitals:   02/09/14 1015  BP: 121/69  Pulse: 67  Temp:   Resp: 22    Complications: No apparent anesthesia complications

## 2014-02-09 NOTE — Telephone Encounter (Signed)
Per Dr Darrel Hoover request will disregaurd msg re: U/A since pt has had IV antibiotics this am.

## 2014-02-11 ENCOUNTER — Encounter (HOSPITAL_BASED_OUTPATIENT_CLINIC_OR_DEPARTMENT_OTHER): Payer: Self-pay | Admitting: General Surgery

## 2014-02-12 ENCOUNTER — Encounter (INDEPENDENT_AMBULATORY_CARE_PROVIDER_SITE_OTHER): Payer: Medicare Other | Admitting: General Surgery

## 2014-02-24 ENCOUNTER — Other Ambulatory Visit: Payer: Self-pay | Admitting: Internal Medicine

## 2014-02-26 ENCOUNTER — Ambulatory Visit (INDEPENDENT_AMBULATORY_CARE_PROVIDER_SITE_OTHER): Payer: Medicare Other | Admitting: General Surgery

## 2014-02-26 ENCOUNTER — Encounter (INDEPENDENT_AMBULATORY_CARE_PROVIDER_SITE_OTHER): Payer: Self-pay | Admitting: General Surgery

## 2014-02-26 VITALS — BP 102/70 | HR 76 | Resp 18 | Ht 75.0 in | Wt 274.0 lb

## 2014-02-26 DIAGNOSIS — K4090 Unilateral inguinal hernia, without obstruction or gangrene, not specified as recurrent: Secondary | ICD-10-CM

## 2014-02-26 DIAGNOSIS — K4091 Unilateral inguinal hernia, without obstruction or gangrene, recurrent: Secondary | ICD-10-CM

## 2014-02-26 NOTE — Progress Notes (Signed)
Patient ID: Brian Mccullough, male   DOB: 10/09/1951, 63 y.o.   MRN: [id number] History: This gentleman underwent opera pair of recurrent left inguinal hernia with mesh, an open repair of partially incarcerated right inguinal hernia with mesh on 02/09/2014. He is doing very well considering. In a lot of pain the first week but now things are settling down. He still feels a little tightness and pulling. Normal bowel and bladder function. Normal appetite. He says he has mode as long.  Exam: Patient looks well. No distress Exam is standing. Bilateral groin scars are healing normally. No hematoma or infection. Normal thickening. No sign of infection. Repairs are intact  Assessment: Recurrent left inguinal hernia, and partially incarcerated right inguinal hernia, recovering uneventfully following open repair with mesh  Plan: Resume normal physical activities without restriction after June 15 Return to see me in 2 months.   Edsel Petrin. Dalbert Batman, M.D., [facility] General and Minimally invasive Surgery Breast and Colorectal Surgery Office:   [phone] Pager:   [phone]

## 2014-02-26 NOTE — Patient Instructions (Signed)
Your hernia repairs are intact, and they appear to be healing without any obvious surgical complication.  The discomfort and tightness that you feel is normal and will slowly go away.  The swelling and thickness will slowly go away over the next few months.  Walk a lot and stretch a lot.  You may resume normal activities without restriction after June 15  Return to see Dr. Dalbert Batman in 2 months to make sure everything is healed properly.

## 2014-03-15 ENCOUNTER — Other Ambulatory Visit: Payer: Self-pay | Admitting: Internal Medicine

## 2014-03-30 ENCOUNTER — Other Ambulatory Visit: Payer: Self-pay | Admitting: Internal Medicine

## 2014-04-02 ENCOUNTER — Other Ambulatory Visit: Payer: Self-pay | Admitting: Internal Medicine

## 2014-04-07 ENCOUNTER — Telehealth: Payer: Self-pay | Admitting: *Deleted

## 2014-04-07 DIAGNOSIS — E119 Type 2 diabetes mellitus without complications: Secondary | ICD-10-CM

## 2014-04-07 NOTE — Telephone Encounter (Signed)
Patient will come by for a lipid panel.  Order placed.

## 2014-04-08 ENCOUNTER — Other Ambulatory Visit (INDEPENDENT_AMBULATORY_CARE_PROVIDER_SITE_OTHER): Payer: Medicare Other

## 2014-04-08 DIAGNOSIS — E119 Type 2 diabetes mellitus without complications: Secondary | ICD-10-CM

## 2014-04-08 DIAGNOSIS — I10 Essential (primary) hypertension: Secondary | ICD-10-CM

## 2014-04-08 LAB — BASIC METABOLIC PANEL
BUN: 9 mg/dL (ref 6–23)
CHLORIDE: 106 meq/L (ref 96–112)
CO2: 25 meq/L (ref 19–32)
Calcium: 9.2 mg/dL (ref 8.4–10.5)
Creatinine, Ser: 0.9 mg/dL (ref 0.4–1.5)
GFR: 108.53 mL/min (ref >60.00)
Glucose, Bld: 104 mg/dL — ABNORMAL HIGH (ref 70–99)
Potassium: 3.8 mEq/L (ref 3.5–5.1)
Sodium: 139 mEq/L (ref 135–145)

## 2014-04-08 LAB — LIPID PANEL
CHOL/HDL RATIO: 5
CHOLESTEROL: 203 mg/dL — AB (ref 0–200)
HDL: 37.7 mg/dL — ABNORMAL LOW (ref >39.00)
LDL CALC: 146 mg/dL — AB (ref 0–99)
NonHDL: 165.3
Triglycerides: 96 mg/dL (ref 0.0–149.0)
VLDL: 19.2 mg/dL (ref 0.0–40.0)

## 2014-04-08 LAB — HEMOGLOBIN A1C: Hgb A1c MFr Bld: 5.2 % (ref 4.6–6.5)

## 2014-04-14 ENCOUNTER — Ambulatory Visit (INDEPENDENT_AMBULATORY_CARE_PROVIDER_SITE_OTHER): Payer: Medicare Other | Admitting: Internal Medicine

## 2014-04-14 ENCOUNTER — Encounter: Payer: Self-pay | Admitting: Internal Medicine

## 2014-04-14 ENCOUNTER — Telehealth: Payer: Self-pay | Admitting: Internal Medicine

## 2014-04-14 VITALS — BP 120/80 | HR 86 | Temp 99.4°F | Ht 75.0 in | Wt 260.5 lb

## 2014-04-14 DIAGNOSIS — J209 Acute bronchitis, unspecified: Secondary | ICD-10-CM

## 2014-04-14 DIAGNOSIS — E119 Type 2 diabetes mellitus without complications: Secondary | ICD-10-CM

## 2014-04-14 DIAGNOSIS — I10 Essential (primary) hypertension: Secondary | ICD-10-CM

## 2014-04-14 MED ORDER — AZITHROMYCIN 250 MG PO TABS: ORAL_TABLET | ORAL | Status: DC

## 2014-04-14 NOTE — Patient Instructions (Signed)
Please take all new medication as prescribed - the antibiotic  Please continue all other medications as before, and refills have been done if requested.  Please have the pharmacy call with any other refills you may need.  Please keep your appointments with your specialists as you may have planned   

## 2014-04-14 NOTE — Progress Notes (Signed)
Subjective:    Patient ID: Brian Mccullough, male    DOB: 1951/09/25, 63 y.o.   MRN: [id number]  HPI Here with acute onset mild to mod 2-3 days ST, HA, general weakness and malaise, with prod cough greenish sputum, but Pt denies chest pain, increased sob or doe, wheezing, orthopnea, PND, increased LE swelling, palpitations, dizziness or syncope. Felt better 2 days after onset, then worse again last few days  Wife ill with similar.  Pt denies polydipsia, polyuria, Pt denies new neurological symptoms such as new headache, or facial or extremity weakness or numbness Past Medical History  Diagnosis Date  . Vitamin B 12 deficiency   . LBP (low back pain)   . Hypertension     under control with meds., has been on med. x 30 yr.  . Non-insulin dependent type 2 diabetes mellitus   . OA (osteoarthritis) of knee 01/2004    left  . Palpitations   . Hyperactive gag reflex   . Inguinal hernia 01/2014    bilateral   Past Surgical History  Procedure Laterality Date  . Total knee arthroplasty Left 01/14/2004  . Knee arthroscopy Right   . Inguinal hernia repair  age 2-5  . Tonsillectomy and adenoidectomy  1960  . Inguinal hernia repair Bilateral 02/09/2014    Procedure: OPEN BILATERAL INGUINAL HERNIA REPAIR WITH MESH;  Surgeon: [name], MD;  Location: [place];  Service: General;  Laterality: Bilateral;  . Insertion of mesh Bilateral 02/09/2014    Procedure: INSERTION OF MESH;  Surgeon: [name], MD;  Location: [place];  Service: General;  Laterality: Bilateral;    reports that he has been smoking Cigars.  He has never used smokeless tobacco. He reports that he does not drink alcohol or use illicit drugs. family history includes Diabetes in his father and mother; Hypertension in his mother. Allergies  Allergen Reactions  . Codeine Nausea Only   Current Outpatient Prescriptions on File Prior to Visit  Medication Sig Dispense Refill  . amLODipine  (NORVASC) 5 MG tablet Take 5 mg by mouth daily.      Marland Kitchen amLODipine (NORVASC) 5 MG tablet TAKE 1 TABLET BY MOUTH DAILY  90 tablet  3  . aspirin 81 MG EC tablet Take 81 mg by mouth daily.        . benazepril (LOTENSIN) 40 MG tablet Take 40 mg by mouth daily.      . benazepril (LOTENSIN) 40 MG tablet TAKE 1 TABLET BY MOUTH EVERY DAY  90 tablet  3  . Cholecalciferol (VITAMIN D3) 1000 UNITS tablet Take 1,000 Units by mouth daily.       . clotrimazole-betamethasone (LOTRISONE) cream Apply 1 application topically 2 (two) times daily as needed (yeast).      . COCONUT OIL PO Take 1 tablet by mouth daily. 1000 mg      . cyanocobalamin 100 MCG tablet Take 1,000 mcg by mouth daily.       . furosemide (LASIX) 40 MG tablet Take 1 tablet (40 mg total) by mouth every other day.  30 tablet  5  . meloxicam (MOBIC) 15 MG tablet Take 1 tablet (15 mg total) by mouth daily.  90 tablet  2  . metFORMIN (GLUCOPHAGE) 500 MG tablet Take 500 mg by mouth daily.      . metoprolol tartrate (LOPRESSOR) 25 MG tablet Take 1-2 tablets (25-50 mg total) by mouth 2 (two) times daily as needed (palpitations).  60 tablet  3  . potassium chloride SA (K-DUR,KLOR-CON) 20 MEQ tablet TAKE ONE TABLET BY MOUTH EVERY DAY  30 tablet  11  . tiZANidine (ZANAFLEX) 4 MG tablet Take 4 mg by mouth every 8 (eight) hours as needed for muscle spasms.      Marland Kitchen tiZANidine (ZANAFLEX) 4 MG tablet TAKE 1 TABLET BY MOUTH EVERY 8 HOURS AS NEEDED  90 tablet  1  . traMADol (ULTRAM) 50 MG tablet TAKE 1-2 TABLETS BY MOUTH TWICE DAILY AS NEEDED  100 tablet  1  . triamcinolone (KENALOG) 0.1 % ointment Apply 1 application topically 2 (two) times daily as needed (rash).        Current Facility-Administered Medications on File Prior to Visit  Medication Dose Route Frequency Provider Last Rate Last Dose  . testosterone cypionate (DEPOTESTOTERONE CYPIONATE) injection 200 mg  200 mg Intramuscular Q14 Days Aleksei [name] V, MD       Review of Systems  Constitutional:  Negative for unusual diaphoresis or other sweats  HENT: Negative for ringing in ear Eyes: Negative for double vision or worsening visual disturbance.  Respiratory: Negative for choking and stridor.   Gastrointestinal: Negative for vomiting or other signifcant bowel change Genitourinary: Negative for hematuria or decreased urine volume.  Musculoskeletal: Negative for other MSK pain or swelling Skin: Negative for color change and worsening wound.  Neurological: Negative for tremors and numbness other than noted  Psychiatric/Behavioral: Negative for decreased concentration or agitation other than above       Objective:   Physical Exam BP 120/80  Pulse 86  Temp(Src) 99.4 F (37.4 C) (Oral)  Ht 6\' 3"  (1.905 m)  Wt 260 lb 8 oz (118.162 kg)  BMI 32.56 kg/m2  SpO2 97% VS noted, mild ill  Constitutional: Pt appears well-developed, well-nourished.  HENT: Head: NCAT.  Right Ear: External ear normal.  Left Ear: External ear normal.  Eyes: . Pupils are equal, round, and reactive to light. Conjunctivae and EOM are normal Bilat tm's with mild erythema.  Max sinus areas non tender.  Pharynx with mild erythema, no exudate Neck: Normal range of motion. Neck supple.  Bilat tm's with mild erythema.  Max sinus areas non tender.  Pharynx with mild erythema, no exudate Cardiovascular: Normal rate and regular rhythm.   Pulmonary/Chest: Effort normal and breath sounds normal.  Neurological: Pt is alert. Not confused , motor grossly intact Skin: Skin is warm. No rash Psychiatric: Pt behavior is normal. No agitation.     Assessment & Plan:

## 2014-04-14 NOTE — Telephone Encounter (Signed)
Relevant patient education mailed to patient.  

## 2014-04-14 NOTE — Progress Notes (Signed)
Pre visit review using our clinic review tool, if applicable. No additional management support is needed unless otherwise documented below in the visit note. 

## 2014-04-19 DIAGNOSIS — J209 Acute bronchitis, unspecified: Secondary | ICD-10-CM | POA: Insufficient documentation

## 2014-04-19 NOTE — Assessment & Plan Note (Signed)
Mild to mod, for antibx course,  to f/u any worsening symptoms or concerns 

## 2014-04-19 NOTE — Assessment & Plan Note (Signed)
stable overall by history and exam, recent data reviewed with pt, and pt to continue medical treatment as before,  to f/u any worsening symptoms or concerns BP Readings from Last 3 Encounters:  04/14/14 120/80  02/26/14 102/70  02/09/14 121/64

## 2014-04-19 NOTE — Assessment & Plan Note (Signed)
stable overall by history and exam, recent data reviewed with pt, and pt to continue medical treatment as before,  to f/u any worsening symptoms or concerns Lab Results  Component Value Date   HGBA1C 5.2 04/08/2014   To call for onset polys or cbg > 200

## 2014-04-20 ENCOUNTER — Other Ambulatory Visit: Payer: Self-pay | Admitting: Internal Medicine

## 2014-04-30 ENCOUNTER — Ambulatory Visit (INDEPENDENT_AMBULATORY_CARE_PROVIDER_SITE_OTHER): Payer: Medicare Other | Admitting: General Surgery

## 2014-04-30 ENCOUNTER — Encounter (INDEPENDENT_AMBULATORY_CARE_PROVIDER_SITE_OTHER): Payer: Self-pay | Admitting: General Surgery

## 2014-04-30 VITALS — BP 110/60 | HR 70 | Temp 98.3°F | Resp 18 | Ht 75.0 in | Wt 256.0 lb

## 2014-04-30 DIAGNOSIS — K4091 Unilateral inguinal hernia, without obstruction or gangrene, recurrent: Secondary | ICD-10-CM

## 2014-04-30 DIAGNOSIS — K4090 Unilateral inguinal hernia, without obstruction or gangrene, not specified as recurrent: Secondary | ICD-10-CM

## 2014-04-30 NOTE — Patient Instructions (Signed)
Your bilateral inguinal hernia repairs looked good. The repair is intact. There is no sign of any infection or fluid.  Be sure to stretch well before and after exercise or strenuous work.  Return to see Dr. Dalbert Batman if necessary.

## 2014-04-30 NOTE — Progress Notes (Signed)
Patient ID: Brian Mccullough, male   DOB: [birth year], 63 y.o.   MRN: [id number]   History:  This gentleman underwent opera pair of recurrent left inguinal hernia with mesh, an open repair of partially incarcerated right inguinal hernia with mesh on 02/09/2014. He is doing very well. He is back to full activities without restriction. Denies pain. Satisfied with his progress  Exam:  Patient looks well. No distress  Exam is standing. Bilateral groin scars have healed normally. No hematoma or infection. Still a little thickening of the left cord structures and the left testicle rides a little higher than the right but still in the scrotum. Nontender. Normal testicular contour and size. Nontender. No sign of infection. Repairs are intact   Assessment:  Recurrent left inguinal hernia, and partially incarcerated right inguinal hernia, recovering uneventfully following open repair with mesh   Plan:  Stretch well before and after strenuous activities Return to see me as needed.    Edsel Petrin. Dalbert Batman, M.D., [facility]  General and Minimally invasive Surgery  Breast and Colorectal Surgery  Office: [phone]  Pager: [phone]

## 2014-05-11 ENCOUNTER — Ambulatory Visit: Payer: Medicare Other | Admitting: Internal Medicine

## 2014-05-29 ENCOUNTER — Ambulatory Visit (INDEPENDENT_AMBULATORY_CARE_PROVIDER_SITE_OTHER): Payer: Medicare Other | Admitting: Internal Medicine

## 2014-05-29 ENCOUNTER — Encounter: Payer: Self-pay | Admitting: Internal Medicine

## 2014-05-29 VITALS — BP 110/62 | HR 64 | Temp 98.7°F | Resp 16 | Ht 75.0 in | Wt 259.0 lb

## 2014-05-29 DIAGNOSIS — Z0000 Encounter for general adult medical examination without abnormal findings: Secondary | ICD-10-CM

## 2014-05-29 DIAGNOSIS — K029 Dental caries, unspecified: Secondary | ICD-10-CM

## 2014-05-29 DIAGNOSIS — I10 Essential (primary) hypertension: Secondary | ICD-10-CM

## 2014-05-29 DIAGNOSIS — Z23 Encounter for immunization: Secondary | ICD-10-CM

## 2014-05-29 DIAGNOSIS — N320 Bladder-neck obstruction: Secondary | ICD-10-CM

## 2014-05-29 DIAGNOSIS — E119 Type 2 diabetes mellitus without complications: Secondary | ICD-10-CM

## 2014-05-29 DIAGNOSIS — E538 Deficiency of other specified B group vitamins: Secondary | ICD-10-CM

## 2014-05-29 MED ORDER — TRAMADOL HCL 50 MG PO TABS: ORAL_TABLET | ORAL | Status: DC

## 2014-05-29 MED ORDER — AMLODIPINE BESYLATE 5 MG PO TABS: 5.0000 mg | ORAL_TABLET | Freq: Every day | ORAL | Status: DC

## 2014-05-29 MED ORDER — MELOXICAM 15 MG PO TABS: 15.0000 mg | ORAL_TABLET | Freq: Every day | ORAL | Status: DC | PRN

## 2014-05-29 MED ORDER — TIZANIDINE HCL 4 MG PO TABS: 4.0000 mg | ORAL_TABLET | Freq: Three times a day (TID) | ORAL | Status: DC | PRN

## 2014-05-29 MED ORDER — METFORMIN HCL 500 MG PO TABS: ORAL_TABLET | ORAL | Status: DC

## 2014-05-29 NOTE — Progress Notes (Signed)
Subjective:    HPI The patient is here for a wellness exam.   The patient has been doing well overall without major physical or psychological issues going on lately.  The patient presents for a follow-up of  chronic hypertension, chronic dyslipidemia, type 2 diabetes controlled with medicines.  F/u palpitations episodes - rare or none    Wt Readings from Last 3 Encounters:  05/29/14 259 lb (117.482 kg)  04/30/14 256 lb (116.121 kg)  04/14/14 260 lb 8 oz (118.162 kg)   BP Readings from Last 3 Encounters:  05/29/14 110/62  04/30/14 110/60  04/14/14 120/80     Review of Systems  Constitutional: Negative for appetite change, fatigue and unexpected weight change.  HENT: Negative for congestion, ear pain, hearing loss, nosebleeds, sneezing, sore throat and trouble swallowing.   Eyes: Negative for itching and visual disturbance.  Respiratory: Negative for cough.   Cardiovascular: Negative for chest pain, palpitations and leg swelling.  Gastrointestinal: Negative for nausea, vomiting, diarrhea, blood in stool and abdominal distention.  Genitourinary: Negative for frequency and hematuria.  Musculoskeletal: Negative for back pain, gait problem, joint swelling and neck pain.  Skin: Negative for rash and wound.  Neurological: Negative for dizziness, tremors, speech difficulty, weakness, light-headedness, numbness and headaches.  Psychiatric/Behavioral: Negative for suicidal ideas, sleep disturbance, dysphoric mood and agitation. The patient is not nervous/anxious and is not hyperactive.        Objective:   Physical Exam  Constitutional: He is oriented to person, place, and time. He appears well-developed. No distress.  Less obese   HENT:  Nose: Nose normal.  Mouth/Throat: Oropharynx is clear and moist. No oropharyngeal exudate.  Eyes: Conjunctivae are normal. Pupils are equal, round, and reactive to light.  Neck: Normal range of motion. No JVD present. No thyromegaly present.   Cardiovascular: Normal rate, regular rhythm, normal heart sounds and intact distal pulses.  Exam reveals no gallop and no friction rub.   No murmur heard. Pulmonary/Chest: Effort normal and breath sounds normal. No respiratory distress. He has no wheezes. He has no rales. He exhibits no tenderness.  Abdominal: Soft. Bowel sounds are normal. He exhibits no distension and no mass. There is no tenderness. There is no rebound and no guarding.  Genitourinary: Rectum normal, prostate normal and penis normal. Guaiac negative stool. No penile tenderness.  Musculoskeletal: Normal range of motion. He exhibits no edema and no tenderness.  Lymphadenopathy:    He has no cervical adenopathy.  Neurological: He is alert and oriented to person, place, and time. He has normal reflexes. No cranial nerve deficit. He exhibits normal muscle tone. Coordination normal.  Skin: Skin is warm and dry. No rash noted. He is not diaphoretic.  Psychiatric: He has a normal mood and affect. His behavior is normal. Judgment and thought content normal.  Caries  Lab Results  Component Value Date   WBC 9.6 02/05/2014   HGB 14.6 02/09/2014   HCT 40.7 02/05/2014   PLT 300 02/05/2014   GLUCOSE 104* 04/08/2014   CHOL 203* 04/08/2014   TRIG 96.0 04/08/2014   HDL 37.70* 04/08/2014   LDLDIRECT 88.1 01/23/2011   LDLCALC 146* 04/08/2014   ALT 11 10/07/2012   AST 15 10/07/2012   NA 139 04/08/2014   K 3.8 04/08/2014   CL 106 04/08/2014   CREATININE 0.9 04/08/2014   BUN 9 04/08/2014   CO2 25 04/08/2014   TSH 0.80 06/19/2013   PSA 0.46 10/07/2012   HGBA1C 5.2 04/08/2014   MICROALBUR 0.3  01/23/2011           Assessment & Plan:

## 2014-05-29 NOTE — Assessment & Plan Note (Signed)
PSA

## 2014-05-29 NOTE — Assessment & Plan Note (Signed)
Doing well 

## 2014-05-29 NOTE — Assessment & Plan Note (Signed)
Continue with current prescription therapy as reflected on the Med list.  

## 2014-05-29 NOTE — Assessment & Plan Note (Signed)
Here for medicare wellness/physical  Diet: heart healthy  Physical activity: not sedentary  Depression/mood screen: negative  Hearing: intact to whispered voice  Visual acuity: grossly normal, performs annual eye exam  ADLs: capable  Fall risk: none  Home safety: good  Cognitive evaluation: intact to orientation, naming, recall and repetition  EOL planning: adv directives, full code/ I agree  I have personally reviewed and have noted  1. The patient's medical and social history  2. Their use of alcohol, tobacco or illicit drugs  3. Their current medications and supplements  4. The patient's functional ability including ADL's, fall risks, home safety risks and hearing or visual impairment.  5. Diet and physical activities  6. Evidence for depression or mood disorders    Today patient counseled on age appropriate routine health concerns for screening and prevention, each reviewed and up to date or declined. Immunizations reviewed and up to date or declined. Labs ordered and reviewed. Risk factors for depression reviewed and negative. Hearing function and visual acuity are intact. ADLs screened and addressed as needed. Functional ability and level of safety reviewed and appropriate. Education, counseling and referrals performed based on assessed risks today. Patient provided with a copy of personalized plan for preventive services.    [place] appt to address caries

## 2014-05-29 NOTE — Patient Instructions (Signed)

## 2014-05-29 NOTE — Progress Notes (Signed)
Pre visit review using our clinic review tool, if applicable. No additional management support is needed unless otherwise documented below in the visit note. 

## 2014-05-29 NOTE — Assessment & Plan Note (Signed)
[facility] appt to address caries

## 2014-07-07 ENCOUNTER — Telehealth: Payer: Self-pay | Admitting: Internal Medicine

## 2014-07-07 NOTE — Telephone Encounter (Signed)
Patient Information:  Caller Name: [place]  Phone: [phone]  Patient: Brian Mccullough, Brian Mccullough  Gender: Male  DOB: [birth year]  Age: 63 Years  PCP: [name], Alex (Adults only)  Office Follow Up:  Does the office need to follow up with this patient?: Yes  Instructions For The Office: Has see today in office disposition. No appt available in EPIC   Symptoms  Reason For Call & Symptoms: Rishon states he has had flu like symptoms onset 07/05/14.  Has had light headedness, chills, tactile fever, low energy. Feels some better on 07/07/14. States he had onset of orange colored urine on 07/07/14. Denies pain , new frequency or urgency with urination.  Has a refill for ZPak from a previous episode with similar symptoms. Asking shlould he refill the [place] not to take Zpak until directed by office. Per influenza seasonal protocol has see provider today in office due to fever > 3 days. No appointments available in EPIC. Please call Ad[facility] at [phone].  Reviewed Health History In EMR: Yes  Reviewed Medications In EMR: Yes  Reviewed Allergies In EMR: Yes  Reviewed Surgeries / Procedures: Yes  Date of Onset of Symptoms: 07/05/2014  Any Fever: Yes  Fever Taken: Tactile  Fever Time Of Reading: 06:35:32  Fever Last Reading: N/A  Guideline(s) Used:  Urination Pain - Male  Influenza - Seasonal  Disposition Per Guideline:   See Today in Office  Reason For Disposition Reached:   Fever present > 3 days (72 hours)  Advice Given:  N/A  Patient Will Follow Care Advice:  YES

## 2014-07-07 NOTE — Telephone Encounter (Signed)
I called pt- advised PCP is out of office all week. He needs to see another MD. Transferred to scheduler.

## 2014-07-09 ENCOUNTER — Ambulatory Visit: Payer: Medicare Other | Admitting: Nurse Practitioner

## 2014-07-09 ENCOUNTER — Other Ambulatory Visit: Payer: Medicare Other

## 2014-07-09 ENCOUNTER — Encounter: Payer: Self-pay | Admitting: Internal Medicine

## 2014-07-09 ENCOUNTER — Ambulatory Visit (INDEPENDENT_AMBULATORY_CARE_PROVIDER_SITE_OTHER): Payer: Medicare Other | Admitting: Internal Medicine

## 2014-07-09 VITALS — BP 100/60 | HR 86 | Temp 98.9°F | Resp 12 | Wt 252.8 lb

## 2014-07-09 DIAGNOSIS — R8290 Unspecified abnormal findings in urine: Secondary | ICD-10-CM

## 2014-07-09 DIAGNOSIS — K029 Dental caries, unspecified: Secondary | ICD-10-CM

## 2014-07-09 DIAGNOSIS — R3989 Other symptoms and signs involving the genitourinary system: Secondary | ICD-10-CM

## 2014-07-09 DIAGNOSIS — J069 Acute upper respiratory infection, unspecified: Secondary | ICD-10-CM

## 2014-07-09 LAB — POCT URINALYSIS DIPSTICK
Bilirubin, UA: NEGATIVE
Glucose, UA: NEGATIVE
Nitrite, UA: POSITIVE
PH UA: 6
SPEC GRAV UA: 1.015
Urobilinogen, UA: 0.2

## 2014-07-09 MED ORDER — SULFAMETHOXAZOLE-TMP DS 800-160 MG PO TABS: 1.0000 | ORAL_TABLET | Freq: Two times a day (BID) | ORAL | Status: DC

## 2014-07-09 NOTE — Progress Notes (Signed)
Pre visit review using our clinic review tool, if applicable. No additional management support is needed unless otherwise documented below in the visit note. 

## 2014-07-09 NOTE — Progress Notes (Signed)
   Subjective:    Patient ID: Brian Mccullough, male    DOB: Mar 10, 1951, 63 y.o.   MRN: [id number]  HPI    Symptoms began/28/15 with fever and chills. Symptoms progressed to include a frontal headache and some stiffness in his neck described as tightness. He had associated dizziness and diarrhea.  He's had a cough with thick, clear sputum which he believes represents postnasal drainage.  In the last 36 hours "viral symptoms" have essentially resolved; but as of 9/30 his urine turned orange  He's had a friend who had urine color changes in the context of prostate cancer and was concerned.  He does smoke 3 cigars a day. He has a past history of 14 pack years.    Review of Systems  He denies facial pain, nasal purulence  He is not having fever, chills, or sweats.  He also denies dysuria, pyuria, or hematuria.       Objective:   Physical Exam   Positive or pertinent physical findings include: Head shaven. Mustache He has severe caries especially the left maxilla.  General appearance :adequately nourished; in no distress. Eyes: No conjunctival inflammation or scleral icterus is present. Oral exam:  Lips and gums are healthy appearing.There is mild oropharyngeal erythema ; no exudate noted.  Heart:  Normal rate and regular rhythm. S1 and S2 normal without gallop, murmur, click, rub or other extra sounds   Lungs:Chest clear to auscultation; no wheezes, rhonchi,rales ,or rubs present.No increased work of breathing.  Vascular : all pulses equal ; no bruits present. Skin:Warm & dry.  Intact without suspicious lesions or rashes ; no jaundice or tenting Lymphatic: No lymphadenopathy is noted about the head, neck, axilla            Assessment & Plan:  #1 viral upper respiratory tract infection, resolving  #2 abnormal urine suggesting active infection  #3 severe caries  #4 active smoker  Plan: The risk of #3 and #4 were discussed. He'll be placed on sulfa pending the  results of his urine culture. Aggressive nasal hygiene recommended to prevent sinusitis

## 2014-07-09 NOTE — Patient Instructions (Addendum)
Plain Mucinex (NOT D) for thick secretions ;force NON dairy fluids .   Nasal cleansing in the shower as discussed with lather of mild shampoo.After 10 seconds wash off lather while  exhaling through nostrils. Make sure that all residual soap is removed to prevent irritation.  Flonase OR Nasacort AQ 1 spray in each nostril twice a day as needed. Use the "crossover" technique into opposite nostril spraying toward opposite ear @ 45 degree angle, not straight up into nostril.  Use a Neti pot daily only  as needed for significant sinus congestion; going from open side to congested side . Plain Allegra (NOT D )  160 daily , Loratidine 10 mg , OR Zyrtec 10 mg @ bedtime  as needed for itchy eyes & sneezing.  Your next office appointment will be determined based upon review of your pending urine culture. Those instructions will be transmitted to you through My Chart  OR  by mail;whichever process is your choice to receive results & recommendations .  Followup as needed for your acute issue. Please report any significant change in your symptoms.   Caries ( cavities) and plaque on the teeth can lead to significant local and systemic infections with threat to your health.Please have dental care completed as soon as possible.

## 2014-07-09 NOTE — Progress Notes (Signed)
   Subjective:    Patient ID: Brian Mccullough, male    DOB: May 23, 1951, 63 y.o.   MRN: [id number]  HPI  Brian Mccullough is here today for upper respiratory symptoms and a change in urine color.  He first noticed symptoms on 9/28.  Symptoms began with fever and chills.  He has experienced progression of symptoms to include headache, lateral neck stiffness described as "tightness," dizziness, and rhinorrhea.   Current symptoms include rhinorrhea, productive cough with thick, clear sputum originating from his nose.  In the past 36 hours he has not experienced fever, chills, headache, dizziness, or neck stiffness.  He is a current smoker: 3 black & mild cigars per day; with a pack year history of 14 years.  Of concern to him is a change in color of urine to orange on 9/30.    Review of Systems Patient denies dysuria, hematuria, increased urinary frequency, and urgency.     Objective:   Physical Exam  Positive physical findings: Nasal turbinate edema and redness, Pharyngeal redness, caries, mild anterior cervical lymphadenopathy.      Assessment & Plan:

## 2014-07-10 ENCOUNTER — Other Ambulatory Visit: Payer: Self-pay | Admitting: Internal Medicine

## 2014-07-12 LAB — URINE CULTURE: Colony Count: >=100000

## 2014-07-18 ENCOUNTER — Other Ambulatory Visit: Payer: Self-pay

## 2014-07-18 MED ORDER — TRAMADOL HCL 50 MG PO TABS: ORAL_TABLET | ORAL | Status: DC

## 2014-07-20 ENCOUNTER — Telehealth: Payer: Self-pay

## 2014-07-20 NOTE — Telephone Encounter (Signed)
Faxed over Tramadol 50mg  1 to 2 tablets po bid prn to [facility] on [address].

## 2014-08-31 ENCOUNTER — Ambulatory Visit (INDEPENDENT_AMBULATORY_CARE_PROVIDER_SITE_OTHER): Payer: Medicare Other | Admitting: Internal Medicine

## 2014-08-31 ENCOUNTER — Encounter: Payer: Self-pay | Admitting: Internal Medicine

## 2014-08-31 ENCOUNTER — Other Ambulatory Visit (INDEPENDENT_AMBULATORY_CARE_PROVIDER_SITE_OTHER): Payer: Medicare Other

## 2014-08-31 VITALS — BP 138/72 | HR 62 | Temp 99.2°F | Wt 259.0 lb

## 2014-08-31 DIAGNOSIS — E119 Type 2 diabetes mellitus without complications: Secondary | ICD-10-CM

## 2014-08-31 DIAGNOSIS — K029 Dental caries, unspecified: Secondary | ICD-10-CM

## 2014-08-31 DIAGNOSIS — Z0000 Encounter for general adult medical examination without abnormal findings: Secondary | ICD-10-CM

## 2014-08-31 DIAGNOSIS — E538 Deficiency of other specified B group vitamins: Secondary | ICD-10-CM

## 2014-08-31 DIAGNOSIS — N320 Bladder-neck obstruction: Secondary | ICD-10-CM

## 2014-08-31 DIAGNOSIS — I10 Essential (primary) hypertension: Secondary | ICD-10-CM

## 2014-08-31 DIAGNOSIS — E539 Vitamin B deficiency, unspecified: Secondary | ICD-10-CM

## 2014-08-31 LAB — CBC WITH DIFFERENTIAL/PLATELET
Basophils Absolute: 0 10*3/uL (ref 0.0–0.1)
Basophils Relative: 0.6 % (ref 0.0–3.0)
Eosinophils Absolute: 0.2 10*3/uL (ref 0.0–0.7)
Eosinophils Relative: 3.7 % (ref 0.0–5.0)
HEMATOCRIT: 44.7 % (ref 39.0–52.0)
HEMOGLOBIN: 14.6 g/dL (ref 13.0–17.0)
LYMPHS ABS: 2.1 10*3/uL (ref 0.7–4.0)
Lymphocytes Relative: 34.2 % (ref 12.0–46.0)
MCHC: 32.6 g/dL (ref 30.0–36.0)
MCV: 89.8 fl (ref 78.0–100.0)
MONOS PCT: 10.3 % (ref 3.0–12.0)
Monocytes Absolute: 0.6 10*3/uL (ref 0.1–1.0)
NEUTROS ABS: 3.1 10*3/uL (ref 1.4–7.7)
Neutrophils Relative %: 51.2 % (ref 43.0–77.0)
Platelets: 187 10*3/uL (ref 150.0–400.0)
RBC: 4.98 Mil/uL (ref 4.22–5.81)
RDW: 14.4 % (ref 11.5–15.5)
WBC: 6.1 10*3/uL (ref 4.0–10.5)

## 2014-08-31 LAB — BASIC METABOLIC PANEL
BUN: 11 mg/dL (ref 6–23)
CO2: 24 meq/L (ref 19–32)
CREATININE: 0.9 mg/dL (ref 0.4–1.5)
Calcium: 9.3 mg/dL (ref 8.4–10.5)
Chloride: 105 mEq/L (ref 96–112)
GFR: 112.66 mL/min (ref >60.00)
GLUCOSE: 102 mg/dL — AB (ref 70–99)
Potassium: 3.8 mEq/L (ref 3.5–5.1)
Sodium: 139 mEq/L (ref 135–145)

## 2014-08-31 LAB — VITAMIN B12: Vitamin B-12: 436 pg/mL (ref 211–911)

## 2014-08-31 LAB — HEMOGLOBIN A1C: Hgb A1c MFr Bld: 5.6 % (ref 4.6–6.5)

## 2014-08-31 LAB — LIPID PANEL
CHOL/HDL RATIO: 6
Cholesterol: 203 mg/dL — ABNORMAL HIGH (ref 0–200)
HDL: 33.4 mg/dL — AB (ref >39.00)
LDL Cholesterol: 150 mg/dL — ABNORMAL HIGH (ref 0–99)
NONHDL: 169.6
Triglycerides: 99 mg/dL (ref 0.0–149.0)
VLDL: 19.8 mg/dL (ref 0.0–40.0)

## 2014-08-31 LAB — URINALYSIS, ROUTINE W REFLEX MICROSCOPIC
Hgb urine dipstick: NEGATIVE
NITRITE: POSITIVE — AB
Total Protein, Urine: NEGATIVE
Urine Glucose: NEGATIVE
Urobilinogen, UA: 0.2 (ref 0.0–1.0)
pH: 5.5 (ref 5.0–8.0)

## 2014-08-31 LAB — TSH: TSH: 1.41 u[IU]/mL (ref 0.35–4.50)

## 2014-08-31 LAB — HEPATIC FUNCTION PANEL
ALBUMIN: 3.8 g/dL (ref 3.5–5.2)
ALT: 11 U/L (ref 0–53)
AST: 14 U/L (ref 0–37)
Alkaline Phosphatase: 54 U/L (ref 39–117)
Bilirubin, Direct: 0.1 mg/dL (ref 0.0–0.3)
Total Bilirubin: 0.6 mg/dL (ref 0.2–1.2)
Total Protein: 7.2 g/dL (ref 6.0–8.3)

## 2014-08-31 LAB — PSA: PSA: 0.25 ng/mL (ref 0.10–4.00)

## 2014-08-31 MED ORDER — METFORMIN HCL 500 MG PO TABS: ORAL_TABLET | ORAL | Status: DC

## 2014-08-31 MED ORDER — METOPROLOL TARTRATE 25 MG PO TABS: 25.0000 mg | ORAL_TABLET | Freq: Two times a day (BID) | ORAL | Status: DC | PRN

## 2014-08-31 MED ORDER — TIZANIDINE HCL 4 MG PO TABS: 4.0000 mg | ORAL_TABLET | Freq: Three times a day (TID) | ORAL | Status: DC | PRN

## 2014-08-31 MED ORDER — MELOXICAM 15 MG PO TABS: 15.0000 mg | ORAL_TABLET | Freq: Every day | ORAL | Status: DC | PRN

## 2014-08-31 NOTE — Progress Notes (Signed)
Subjective:    HPI  F/u palpitations  - no relapse  The patient presents for a follow-up of  chronic hypertension, chronic dyslipidemia, type 2 diabetes controlled with medicines. He lost wt on diet; low CBGs at times- he reduced glimeperide to 1 qd     Wt Readings from Last 3 Encounters:  08/31/14 259 lb (117.482 kg)  07/09/14 252 lb 12 oz (114.647 kg)  05/29/14 259 lb (117.482 kg)   BP Readings from Last 3 Encounters:  08/31/14 138/72  07/09/14 100/60  05/29/14 110/62     Review of Systems  Constitutional: Negative for appetite change, fatigue and unexpected weight change.  HENT: Negative for congestion, ear pain, hearing loss, nosebleeds, sneezing, sore throat and trouble swallowing.   Eyes: Negative for itching and visual disturbance.  Respiratory: Negative for cough.   Cardiovascular: Negative for chest pain, palpitations and leg swelling.  Gastrointestinal: Negative for nausea, vomiting, diarrhea, blood in stool and abdominal distention.  Genitourinary: Negative for frequency and hematuria.  Musculoskeletal: Negative for back pain, joint swelling, gait problem and neck pain.  Skin: Negative for rash and wound.  Neurological: Negative for dizziness, tremors, speech difficulty, weakness, light-headedness, numbness and headaches.  Psychiatric/Behavioral: Negative for suicidal ideas, sleep disturbance, dysphoric mood and agitation. The patient is not nervous/anxious and is not hyperactive.        Objective:   Physical Exam  Constitutional: He is oriented to person, place, and time. He appears well-developed. No distress.  NAD  HENT:  Mouth/Throat: Oropharynx is clear and moist.  Eyes: Conjunctivae are normal. Pupils are equal, round, and reactive to light.  Neck: Normal range of motion. No JVD present. No thyromegaly present.  Cardiovascular: Normal rate, regular rhythm, normal heart sounds and intact distal pulses.  Exam reveals no gallop and no friction rub.    No murmur heard. Pulmonary/Chest: Effort normal and breath sounds normal. No respiratory distress. He has no wheezes. He has no rales. He exhibits no tenderness.  Abdominal: Soft. Bowel sounds are normal. He exhibits no distension and no mass. There is no tenderness. There is no rebound and no guarding.  Musculoskeletal: Normal range of motion. He exhibits no edema or tenderness.  Lymphadenopathy:    He has no cervical adenopathy.  Neurological: He is alert and oriented to person, place, and time. He has normal reflexes. No cranial nerve deficit. He exhibits normal muscle tone. He displays a negative Romberg sign. Coordination and gait normal.  No meningeal signs  Skin: Skin is warm and dry. No rash noted.  Psychiatric: He has a normal mood and affect. His behavior is normal. Judgment and thought content normal.  Caries, missing teeth   Lab Results  Component Value Date   WBC 9.6 02/05/2014   HGB 14.6 02/09/2014   HCT 40.7 02/05/2014   PLT 300 02/05/2014   GLUCOSE 104* 04/08/2014   CHOL 203* 04/08/2014   TRIG 96.0 04/08/2014   HDL 37.70* 04/08/2014   LDLDIRECT 88.1 01/23/2011   LDLCALC 146* 04/08/2014   ALT 11 10/07/2012   AST 15 10/07/2012   NA 139 04/08/2014   K 3.8 04/08/2014   CL 106 04/08/2014   CREATININE 0.9 04/08/2014   BUN 9 04/08/2014   CO2 25 04/08/2014   TSH 0.80 06/19/2013   PSA 0.46 10/07/2012   HGBA1C 5.2 04/08/2014   MICROALBUR 0.3 01/23/2011           Assessment & Plan:  Patient ID: Brian Mccullough, male   DOB: 1950-10-18,  63 y.o.   MRN: [id number]

## 2014-08-31 NOTE — Assessment & Plan Note (Signed)
Continue with current prescription therapy as reflected on the Med list. Labs  

## 2014-08-31 NOTE — Assessment & Plan Note (Signed)
Continue with current prescription therapy as reflected on the Med list.  BP Readings from Last 3 Encounters:  08/31/14 138/72  07/09/14 100/60  05/29/14 110/62

## 2014-08-31 NOTE — Progress Notes (Signed)
Pre visit review using our clinic review tool, if applicable. No additional management support is needed unless otherwise documented below in the visit note. 

## 2014-08-31 NOTE — Assessment & Plan Note (Signed)
Brian Mccullough is making plans

## 2014-09-01 MED ORDER — CIPROFLOXACIN HCL 500 MG PO TABS: 500.0000 mg | ORAL_TABLET | Freq: Two times a day (BID) | ORAL | Status: DC

## 2014-09-16 ENCOUNTER — Telehealth: Payer: Self-pay | Admitting: Internal Medicine

## 2014-09-16 NOTE — Telephone Encounter (Signed)
Notified pt have him a1c from last visit...Brian Mccullough

## 2014-09-16 NOTE — Telephone Encounter (Signed)
Pt just wants his A1C numbers.   Best number [phone]

## 2014-12-30 ENCOUNTER — Other Ambulatory Visit: Payer: Self-pay | Admitting: Internal Medicine

## 2014-12-30 ENCOUNTER — Other Ambulatory Visit (INDEPENDENT_AMBULATORY_CARE_PROVIDER_SITE_OTHER): Payer: Medicare Other

## 2014-12-30 ENCOUNTER — Encounter: Payer: Self-pay | Admitting: Internal Medicine

## 2014-12-30 ENCOUNTER — Ambulatory Visit (INDEPENDENT_AMBULATORY_CARE_PROVIDER_SITE_OTHER): Payer: Medicare Other | Admitting: Internal Medicine

## 2014-12-30 VITALS — BP 106/68 | HR 49 | Wt 263.0 lb

## 2014-12-30 DIAGNOSIS — E669 Obesity, unspecified: Secondary | ICD-10-CM

## 2014-12-30 DIAGNOSIS — G4700 Insomnia, unspecified: Secondary | ICD-10-CM

## 2014-12-30 DIAGNOSIS — E538 Deficiency of other specified B group vitamins: Secondary | ICD-10-CM

## 2014-12-30 DIAGNOSIS — E119 Type 2 diabetes mellitus without complications: Secondary | ICD-10-CM

## 2014-12-30 DIAGNOSIS — I10 Essential (primary) hypertension: Secondary | ICD-10-CM | POA: Diagnosis not present

## 2014-12-30 DIAGNOSIS — R002 Palpitations: Secondary | ICD-10-CM

## 2014-12-30 LAB — HEMOGLOBIN A1C: Hgb A1c MFr Bld: 5.6 % (ref 4.6–6.5)

## 2014-12-30 LAB — BASIC METABOLIC PANEL
BUN: 13 mg/dL (ref 6–23)
CALCIUM: 9.5 mg/dL (ref 8.4–10.5)
CO2: 28 mEq/L (ref 19–32)
Chloride: 108 mEq/L (ref 96–112)
Creatinine, Ser: 1.12 mg/dL (ref 0.40–1.50)
GFR: 85.2 mL/min (ref >60.00)
GLUCOSE: 95 mg/dL (ref 70–99)
POTASSIUM: 4.4 meq/L (ref 3.5–5.1)
Sodium: 140 mEq/L (ref 135–145)

## 2014-12-30 MED ORDER — TRAZODONE HCL 50 MG PO TABS: 25.0000 mg | ORAL_TABLET | Freq: Every evening | ORAL | Status: DC | PRN

## 2014-12-30 MED ORDER — METOPROLOL TARTRATE 25 MG PO TABS: 25.0000 mg | ORAL_TABLET | Freq: Two times a day (BID) | ORAL | Status: DC | PRN

## 2014-12-30 NOTE — Assessment & Plan Note (Signed)
Chronic On Metformin 

## 2014-12-30 NOTE — Progress Notes (Signed)
Pre visit review using our clinic review tool, if applicable. No additional management support is needed unless otherwise documented below in the visit note. 

## 2014-12-30 NOTE — Assessment & Plan Note (Signed)
Wt Readings from Last 3 Encounters:  12/30/14 263 lb (119.296 kg)  08/31/14 259 lb (117.482 kg)  07/09/14 252 lb 12 oz (114.647 kg)

## 2014-12-30 NOTE — Assessment & Plan Note (Signed)
Cont Vit B12 

## 2014-12-30 NOTE — Assessment & Plan Note (Signed)
On Metoprolol prn 

## 2014-12-30 NOTE — Assessment & Plan Note (Signed)
Amlodipine, Benazepril, Furosemide

## 2014-12-30 NOTE — Assessment & Plan Note (Signed)
Trazodone qhs 

## 2014-12-30 NOTE — Progress Notes (Signed)
Subjective:    HPI  F/u palpitations  - relapse x1, pt lost his Rx  C/o insomnia  The patient presents for a follow-up of  chronic hypertension, chronic dyslipidemia, type 2 diabetes controlled with medicines. He lost wt on diet; low CBGs at times- he reduced glimeperide to 1 qd     Wt Readings from Last 3 Encounters:  12/30/14 263 lb (119.296 kg)  08/31/14 259 lb (117.482 kg)  07/09/14 252 lb 12 oz (114.647 kg)   BP Readings from Last 3 Encounters:  12/30/14 106/68  08/31/14 138/72  07/09/14 100/60     Review of Systems  Constitutional: Negative for appetite change, fatigue and unexpected weight change.  HENT: Negative for congestion, ear pain, hearing loss, nosebleeds, sneezing, sore throat and trouble swallowing.   Eyes: Negative for itching and visual disturbance.  Respiratory: Negative for cough.   Cardiovascular: Negative for chest pain, palpitations and leg swelling.  Gastrointestinal: Negative for nausea, vomiting, diarrhea, blood in stool and abdominal distention.  Genitourinary: Negative for frequency and hematuria.  Musculoskeletal: Negative for back pain, joint swelling, gait problem and neck pain.  Skin: Negative for rash and wound.  Neurological: Negative for dizziness, tremors, speech difficulty, weakness, light-headedness, numbness and headaches.  Psychiatric/Behavioral: Negative for suicidal ideas, sleep disturbance, dysphoric mood and agitation. The patient is not nervous/anxious and is not hyperactive.        Objective:   Physical Exam  Constitutional: He is oriented to person, place, and time. He appears well-developed. No distress.  NAD  HENT:  Mouth/Throat: Oropharynx is clear and moist.  Eyes: Conjunctivae are normal. Pupils are equal, round, and reactive to light.  Neck: Normal range of motion. No JVD present. No thyromegaly present.  Cardiovascular: Normal rate, regular rhythm, normal heart sounds and intact distal pulses.  Exam reveals no  gallop and no friction rub.   No murmur heard. Pulmonary/Chest: Effort normal and breath sounds normal. No respiratory distress. He has no wheezes. He has no rales. He exhibits no tenderness.  Abdominal: Soft. Bowel sounds are normal. He exhibits no distension and no mass. There is no tenderness. There is no rebound and no guarding.  Musculoskeletal: Normal range of motion. He exhibits no edema or tenderness.  Lymphadenopathy:    He has no cervical adenopathy.  Neurological: He is alert and oriented to person, place, and time. He has normal reflexes. No cranial nerve deficit. He exhibits normal muscle tone. He displays a negative Romberg sign. Coordination and gait normal.  No meningeal signs  Skin: Skin is warm and dry. No rash noted.  Psychiatric: He has a normal mood and affect. His behavior is normal. Judgment and thought content normal.  Caries, missing teeth   Lab Results  Component Value Date   WBC 6.1 08/31/2014   HGB 14.6 08/31/2014   HCT 44.7 08/31/2014   PLT 187.0 08/31/2014   GLUCOSE 102* 08/31/2014   CHOL 203* 08/31/2014   TRIG 99.0 08/31/2014   HDL 33.40* 08/31/2014   LDLDIRECT 88.1 01/23/2011   LDLCALC 150* 08/31/2014   ALT 11 08/31/2014   AST 14 08/31/2014   NA 139 08/31/2014   K 3.8 08/31/2014   CL 105 08/31/2014   CREATININE 0.9 08/31/2014   BUN 11 08/31/2014   CO2 24 08/31/2014   TSH 1.41 08/31/2014   PSA 0.25 08/31/2014   HGBA1C 5.6 08/31/2014   MICROALBUR 0.3 01/23/2011           Assessment & Plan:  Patient ID: Brian Mccullough  Graciella Belton, male   DOB: 06-10-1951, 64 y.o.   MRN: [id number]

## 2015-01-04 ENCOUNTER — Other Ambulatory Visit: Payer: Self-pay | Admitting: Internal Medicine

## 2015-02-04 ENCOUNTER — Other Ambulatory Visit: Payer: Self-pay | Admitting: Internal Medicine

## 2015-02-05 NOTE — Telephone Encounter (Signed)
Please advise, thanks.

## 2015-02-08 ENCOUNTER — Other Ambulatory Visit: Payer: Self-pay | Admitting: Internal Medicine

## 2015-03-17 ENCOUNTER — Other Ambulatory Visit: Payer: Self-pay | Admitting: Internal Medicine

## 2015-03-23 ENCOUNTER — Other Ambulatory Visit: Payer: Self-pay | Admitting: Internal Medicine

## 2015-03-23 NOTE — Telephone Encounter (Signed)
benazapril rx sent to pharm

## 2015-04-01 ENCOUNTER — Other Ambulatory Visit: Payer: Self-pay | Admitting: *Deleted

## 2015-04-01 MED ORDER — MELOXICAM 15 MG PO TABS: 15.0000 mg | ORAL_TABLET | Freq: Every day | ORAL | Status: DC | PRN

## 2015-04-01 MED ORDER — TIZANIDINE HCL 4 MG PO TABS: 4.0000 mg | ORAL_TABLET | Freq: Three times a day (TID) | ORAL | Status: DC | PRN

## 2015-04-01 MED ORDER — POTASSIUM CHLORIDE CRYS ER 20 MEQ PO TBCR: 20.0000 meq | EXTENDED_RELEASE_TABLET | Freq: Every day | ORAL | Status: DC

## 2015-04-01 MED ORDER — METOPROLOL TARTRATE 25 MG PO TABS: 25.0000 mg | ORAL_TABLET | Freq: Two times a day (BID) | ORAL | Status: DC | PRN

## 2015-04-01 MED ORDER — METFORMIN HCL 500 MG PO TABS: ORAL_TABLET | ORAL | Status: DC

## 2015-04-05 ENCOUNTER — Other Ambulatory Visit: Payer: Self-pay

## 2015-04-15 ENCOUNTER — Other Ambulatory Visit: Payer: Self-pay | Admitting: Internal Medicine

## 2015-04-28 ENCOUNTER — Ambulatory Visit (INDEPENDENT_AMBULATORY_CARE_PROVIDER_SITE_OTHER): Payer: Medicare Other | Admitting: Internal Medicine

## 2015-04-28 ENCOUNTER — Encounter: Payer: Self-pay | Admitting: Internal Medicine

## 2015-04-28 VITALS — BP 144/80 | HR 80 | Ht 75.0 in | Wt 264.0 lb

## 2015-04-28 DIAGNOSIS — M159 Polyosteoarthritis, unspecified: Secondary | ICD-10-CM

## 2015-04-28 DIAGNOSIS — Z0000 Encounter for general adult medical examination without abnormal findings: Secondary | ICD-10-CM

## 2015-04-28 DIAGNOSIS — I10 Essential (primary) hypertension: Secondary | ICD-10-CM

## 2015-04-28 DIAGNOSIS — E119 Type 2 diabetes mellitus without complications: Secondary | ICD-10-CM | POA: Diagnosis not present

## 2015-04-28 DIAGNOSIS — M150 Primary generalized (osteo)arthritis: Secondary | ICD-10-CM

## 2015-04-28 DIAGNOSIS — E538 Deficiency of other specified B group vitamins: Secondary | ICD-10-CM | POA: Diagnosis not present

## 2015-04-28 DIAGNOSIS — N320 Bladder-neck obstruction: Secondary | ICD-10-CM

## 2015-04-28 MED ORDER — TRIAMCINOLONE ACETONIDE 0.1 % EX OINT: 1.0000 "application " | TOPICAL_OINTMENT | Freq: Two times a day (BID) | CUTANEOUS | Status: DC | PRN

## 2015-04-28 NOTE — Assessment & Plan Note (Signed)
Chronic Amlodipine, Benazepril, Furosemide

## 2015-04-28 NOTE — Assessment & Plan Note (Signed)
Here for medicare wellness/physical  Diet: heart healthy  Physical activity: not sedentary  Depression/mood screen: negative  Hearing: intact to whispered voice  Visual acuity: grossly normal, performs annual eye exam  ADLs: capable  Fall risk: none  Home safety: good  Cognitive evaluation: intact to orientation, naming, recall and repetition  EOL planning: adv directives, full code/ I agree  I have personally reviewed and have noted  1. The patient's medical and social history  2. Their use of alcohol, tobacco or illicit drugs  3. Their current medications and supplements  4. The patient's functional ability including ADL's, fall risks, home safety risks and hearing or visual impairment.  5. Diet and physical activities  6. Evidence for depression or mood disorders 7. Providers roster reviewed   Today patient counseled on age appropriate routine health concerns for screening and prevention, each reviewed and up to date or declined. Immunizations reviewed and up to date or declined. Labs ordered and reviewed. Risk factors for depression reviewed and negative. Hearing function and visual acuity are intact. ADLs screened and addressed as needed. Functional ability and level of safety reviewed and appropriate. Education, counseling and referrals performed based on assessed risks today. Patient provided with a copy of personalized plan for preventive services.

## 2015-04-28 NOTE — Progress Notes (Signed)
Subjective:  Patient ID: Brian Mccullough, male    DOB: [birth year]  Age: 64 y.o. MRN: [id number]  CC: No chief complaint on file.   HPI [name] presents for well exam. F/u DM, HTN, OA  Past Medical History  Diagnosis Date  . Vitamin B 12 deficiency   . LBP (low back pain)   . Hypertension     under control with meds., has been on med. x 30 yr.  . Non-insulin dependent type 2 diabetes mellitus   . OA (osteoarthritis) of knee 01/2004    left  . Palpitations   . Hyperactive gag reflex   . Inguinal hernia 01/2014    bilateral   Family History  Problem Relation Age of Onset  . Hypertension Mother   . Diabetes Mother   . Diabetes Father    . Outpatient Prescriptions Prior to Visit  Medication Sig Dispense Refill  . amLODipine (NORVASC) 5 MG tablet Take 1 tablet (5 mg total) by mouth daily. 90 tablet 3  . aspirin 81 MG EC tablet Take 81 mg by mouth daily.      . benazepril (LOTENSIN) 40 MG tablet TAKE 1 TABLET BY MOUTH DAILY 90 tablet 1  . Cholecalciferol (VITAMIN D3) 1000 UNITS tablet Take 1,000 Units by mouth daily.     . ciprofloxacin (CIPRO) 500 MG tablet TAKE 1 TABLET BY MOUTH TWICE DAILY 20 tablet 0  . COCONUT OIL PO Take 1 tablet by mouth daily. 1000 mg    . cyanocobalamin 100 MCG tablet Take 1,000 mcg by mouth daily.     . furosemide (LASIX) 40 MG tablet Take 1 tablet (40 mg total) by mouth every other day. 30 tablet 5  . meloxicam (MOBIC) 15 MG tablet Take 1 tablet (15 mg total) by mouth daily as needed for pain. 90 tablet 3  . metFORMIN (GLUCOPHAGE) 500 MG tablet TAKE 1 TABLET BY MOUTH EVERY DAY WITH BREAKFAST 90 tablet 3  . metoprolol tartrate (LOPRESSOR) 25 MG tablet Take 1-2 tablets (25-50 mg total) by mouth 2 (two) times daily as needed (palpitations). 180 tablet 3  . potassium chloride SA (K-DUR,KLOR-CON) 20 MEQ tablet Take 1 tablet (20 mEq total) by mouth daily. 90 tablet 3  . tiZANidine (ZANAFLEX) 4 MG tablet Take 1 tablet (4 mg total) by mouth every 8  (eight) hours as needed for muscle spasms. 90 tablet 1  . traMADol (ULTRAM) 50 MG tablet TAKE 1-2 TABLETS BY MOUTH TWICE DAILY AS NEEDED 100 tablet 0  . traZODone (DESYREL) 50 MG tablet Take 0.5-1 tablets (25-50 mg total) by mouth at bedtime as needed for sleep. 90 tablet 1  . benazepril (LOTENSIN) 40 MG tablet Take 40 mg by mouth daily.    . clotrimazole-betamethasone (LOTRISONE) cream Apply 1 application topically 2 (two) times daily as needed (yeast).    . potassium chloride SA (K-DUR,KLOR-CON) 20 MEQ tablet TAKE 1 TABLET BY MOUTH EVERY DAY 30 tablet 5  . sulfamethoxazole-trimethoprim (BACTRIM DS) 800-160 MG per tablet Take 1 tablet by mouth 2 (two) times daily. 14 tablet 0  . triamcinolone (KENALOG) 0.1 % ointment Apply 1 application topically 2 (two) times daily as needed (rash).      Facility-Administered Medications Prior to Visit  Medication Dose Route Frequency Provider Last Rate Last Dose  . testosterone cypionate (DEPOTESTOTERONE CYPIONATE) injection 200 mg  200 mg Intramuscular Q14 Days Braelyn Jenson V, MD        ROS Review of Systems  Constitutional: Negative for appetite  change, fatigue and unexpected weight change.  HENT: Negative for congestion, nosebleeds, sneezing, sore throat and trouble swallowing.   Eyes: Negative for itching and visual disturbance.  Respiratory: Negative for cough.   Cardiovascular: Negative for chest pain, palpitations and leg swelling.  Gastrointestinal: Negative for nausea, diarrhea, blood in stool and abdominal distention.  Genitourinary: Negative for frequency and hematuria.  Musculoskeletal: Negative for back pain, joint swelling, gait problem and neck pain.  Skin: Negative for rash.  Neurological: Negative for dizziness, tremors, speech difficulty and weakness.  Psychiatric/Behavioral: Negative for sleep disturbance, dysphoric mood and agitation. The patient is not nervous/anxious.     Objective:  BP 144/80 mmHg  Pulse 80  Ht 6\' 3"   (1.905 m)  Wt 264 lb (119.75 kg)  BMI 33.00 kg/m2  SpO2 97%  BP Readings from Last 3 Encounters:  04/28/15 144/80  12/30/14 106/68  08/31/14 138/72    Wt Readings from Last 3 Encounters:  04/28/15 264 lb (119.75 kg)  12/30/14 263 lb (119.296 kg)  08/31/14 259 lb (117.482 kg)    Physical Exam  Constitutional: He is oriented to person, place, and time. He appears well-developed. No distress.  NAD  HENT:  Mouth/Throat: Oropharynx is clear and moist.  Eyes: Conjunctivae are normal. Pupils are equal, round, and reactive to light.  Neck: Normal range of motion. No JVD present. No thyromegaly present.  Cardiovascular: Normal rate, regular rhythm, normal heart sounds and intact distal pulses.  Exam reveals no gallop and no friction rub.   No murmur heard. Pulmonary/Chest: Effort normal and breath sounds normal. No respiratory distress. He has no wheezes. He has no rales. He exhibits no tenderness.  Abdominal: Soft. Bowel sounds are normal. He exhibits no distension and no mass. There is no tenderness. There is no rebound and no guarding.  Musculoskeletal: Normal range of motion. He exhibits no edema or tenderness.  Lymphadenopathy:    He has no cervical adenopathy.  Neurological: He is alert and oriented to person, place, and time. He has normal reflexes. No cranial nerve deficit. He exhibits normal muscle tone. He displays a negative Romberg sign. Coordination and gait normal.  Skin: Skin is warm and dry. No rash noted.  Psychiatric: He has a normal mood and affect. His behavior is normal. Judgment and thought content normal.    Lab Results  Component Value Date   WBC 6.1 08/31/2014   HGB 14.6 08/31/2014   HCT 44.7 08/31/2014   PLT 187.0 08/31/2014   GLUCOSE 95 12/30/2014   CHOL 203* 08/31/2014   TRIG 99.0 08/31/2014   HDL 33.40* 08/31/2014   LDLDIRECT 88.1 01/23/2011   LDLCALC 150* 08/31/2014   ALT 11 08/31/2014   AST 14 08/31/2014   NA 140 12/30/2014   K 4.4 12/30/2014     CL 108 12/30/2014   CREATININE 1.12 12/30/2014   BUN 13 12/30/2014   CO2 28 12/30/2014   TSH 1.41 08/31/2014   PSA 0.25 08/31/2014   HGBA1C 5.6 12/30/2014   MICROALBUR 0.3 01/23/2011    No results found.  Assessment & Plan:   Diagnoses and all orders for this visit:  Well adult exam Orders: -     PSA; Future -     TSH; Future -     Urinalysis; Future -     Vitamin B12; Future -     Basic metabolic panel; Future -     Hepatic function panel; Future -     Hemoglobin A1c; Future -  Lipid panel; Future  Essential hypertension Orders: -     PSA; Future -     TSH; Future -     Urinalysis; Future -     Vitamin B12; Future -     Basic metabolic panel; Future -     Hepatic function panel; Future -     Hemoglobin A1c; Future -     Lipid panel; Future  B12 deficiency Orders: -     PSA; Future -     TSH; Future -     Urinalysis; Future -     Vitamin B12; Future -     Basic metabolic panel; Future -     Hepatic function panel; Future -     Hemoglobin A1c; Future -     Lipid panel; Future  Diabetes mellitus type 2, controlled Orders: -     PSA; Future -     TSH; Future -     Urinalysis; Future -     Vitamin B12; Future -     Basic metabolic panel; Future -     Hepatic function panel; Future -     Hemoglobin A1c; Future -     Lipid panel; Future  Primary osteoarthritis involving multiple joints Orders: -     PSA; Future -     TSH; Future -     Urinalysis; Future -     Vitamin B12; Future -     Basic metabolic panel; Future -     Hepatic function panel; Future -     Hemoglobin A1c; Future -     Lipid panel; Future  Bladder neck obstruction Orders: -     PSA; Future -     TSH; Future -     Urinalysis; Future -     Vitamin B12; Future -     Basic metabolic panel; Future -     Hepatic function panel; Future -     Hemoglobin A1c; Future -     Lipid panel; Future  Other orders -     Cancel: clotrimazole-betamethasone (LOTRISONE) cream; Apply 1  application topically 2 (two) times daily as needed (yeast). -     Cancel: triamcinolone ointment (KENALOG) 0.1 %; Apply 1 application topically 2 (two) times daily as needed (rash). -     Cancel: clotrimazole-betamethasone (LOTRISONE) cream; Apply 1 application topically 2 (two) times daily as needed (yeast). -     triamcinolone ointment (KENALOG) 0.1 %; Apply 1 application topically 2 (two) times daily as needed (rash).  I have discontinued Mr. Thoman clotrimazole-betamethasone and sulfamethoxazole-trimethoprim. I have also changed his triamcinolone ointment. Additionally, I am having him maintain his aspirin, cyanocobalamin, cholecalciferol, furosemide, COCONUT OIL PO, amLODipine, traZODone, ciprofloxacin, benazepril, meloxicam, potassium chloride SA, tiZANidine, metFORMIN, metoprolol tartrate, and traMADol. We will continue to administer testosterone cypionate.  Meds ordered this encounter  Medications  . triamcinolone ointment (KENALOG) 0.1 %    Sig: Apply 1 application topically 2 (two) times daily as needed (rash).    Dispense:  80 g    Refill:  3     Follow-up: Return in about 4 months (around 08/29/2015).  Walker Kehr, MD

## 2015-04-28 NOTE — Assessment & Plan Note (Signed)
PSA

## 2015-04-28 NOTE — Assessment & Plan Note (Signed)
On Vit D 

## 2015-04-28 NOTE — Assessment & Plan Note (Signed)
On B12 

## 2015-04-28 NOTE — Assessment & Plan Note (Signed)
On Metformin 

## 2015-04-28 NOTE — Progress Notes (Signed)
Pre visit review using our clinic review tool, if applicable. No additional management support is needed unless otherwise documented below in the visit note. 

## 2015-04-28 NOTE — Patient Instructions (Signed)
Preventive Care for Adults A healthy lifestyle and preventive care can promote health and wellness. Preventive health guidelines for men include the following key practices:  A routine yearly physical is a good way to check with your health care provider about your health and preventative screening. It is a chance to share any concerns and updates on your health and to receive a thorough exam.  Visit your dentist for a routine exam and preventative care every 6 months. Brush your teeth twice a day and floss once a day. Good oral hygiene prevents tooth decay and gum disease.  The frequency of eye exams is based on your age, health, family medical history, use of contact lenses, and other factors. Follow your health care provider's recommendations for frequency of eye exams.  Eat a healthy diet. Foods such as vegetables, fruits, whole grains, low-fat dairy products, and lean protein foods contain the nutrients you need without too many calories. Decrease your intake of foods high in solid fats, added sugars, and salt. Eat the right amount of calories for you.Get information about a proper diet from your health care provider, if necessary.  Regular physical exercise is one of the most important things you can do for your health. Most adults should get at least 150 minutes of moderate-intensity exercise (any activity that increases your heart rate and causes you to sweat) each week. In addition, most adults need muscle-strengthening exercises on 2 or more days a week.  Maintain a healthy weight. The body mass index (BMI) is a screening tool to identify possible weight problems. It provides an estimate of body fat based on height and weight. Your health care provider can find your BMI and can help you achieve or maintain a healthy weight.For adults 20 years and older:  A BMI below 18.5 is considered underweight.  A BMI of 18.5 to 24.9 is normal.  A BMI of 25 to 29.9 is considered overweight.  A BMI  of 30 and above is considered obese.  Maintain normal blood lipids and cholesterol levels by exercising and minimizing your intake of saturated fat. Eat a balanced diet with plenty of fruit and vegetables. Blood tests for lipids and cholesterol should begin at age 50 and be repeated every 5 years. If your lipid or cholesterol levels are high, you are over 50, or you are at high risk for heart disease, you may need your cholesterol levels checked more frequently.Ongoing high lipid and cholesterol levels should be treated with medicines if diet and exercise are not working.  If you smoke, find out from your health care provider how to quit. If you do not use tobacco, do not start.  Lung cancer screening is recommended for adults aged 73-80 years who are at high risk for developing lung cancer because of a history of smoking. A yearly low-dose CT scan of the lungs is recommended for people who have at least a 30-pack-year history of smoking and are a current smoker or have quit within the past 15 years. A pack year of smoking is smoking an average of 1 pack of cigarettes a day for 1 year (for example: 1 pack a day for 30 years or 2 packs a day for 15 years). Yearly screening should continue until the smoker has stopped smoking for at least 15 years. Yearly screening should be stopped for people who develop a health problem that would prevent them from having lung cancer treatment.  If you choose to drink alcohol, do not have more than  2 drinks per day. One drink is considered to be 12 ounces (355 mL) of beer, 5 ounces (148 mL) of wine, or 1.5 ounces (44 mL) of liquor.  Avoid use of street drugs. Do not share needles with anyone. Ask for help if you need support or instructions about stopping the use of drugs.  High blood pressure causes heart disease and increases the risk of stroke. Your blood pressure should be checked at least every 1-2 years. Ongoing high blood pressure should be treated with  medicines, if weight loss and exercise are not effective.  If you are 45-79 years old, ask your health care provider if you should take aspirin to prevent heart disease.  Diabetes screening involves taking a blood sample to check your fasting blood sugar level. This should be done once every 3 years, after age 45, if you are within normal weight and without risk factors for diabetes. Testing should be considered at a younger age or be carried out more frequently if you are overweight and have at least 1 risk factor for diabetes.  Colorectal cancer can be detected and often prevented. Most routine colorectal cancer screening begins at the age of 50 and continues through age 75. However, your health care provider may recommend screening at an earlier age if you have risk factors for colon cancer. On a yearly basis, your health care provider may provide home test kits to check for hidden blood in the stool. Use of a small camera at the end of a tube to directly examine the colon (sigmoidoscopy or colonoscopy) can detect the earliest forms of colorectal cancer. Talk to your health care provider about this at age 50, when routine screening begins. Direct exam of the colon should be repeated every 5-10 years through age 75, unless early forms of precancerous polyps or small growths are found.  People who are at an increased risk for hepatitis B should be screened for this virus. You are considered at high risk for hepatitis B if:  You were born in a country where hepatitis B occurs often. Talk with your health care provider about which countries are considered high risk.  Your parents were born in a high-risk country and you have not received a shot to protect against hepatitis B (hepatitis B vaccine).  You have HIV or AIDS.  You use needles to inject street drugs.  You live with, or have sex with, someone who has hepatitis B.  You are a man who has sex with other men (MSM).  You get hemodialysis  treatment.  You take certain medicines for conditions such as cancer, organ transplantation, and autoimmune conditions.  Hepatitis C blood testing is recommended for all people born from 1945 through 1965 and any individual with known risks for hepatitis C.  Practice safe sex. Use condoms and avoid high-risk sexual practices to reduce the spread of sexually transmitted infections (STIs). STIs include gonorrhea, chlamydia, syphilis, trichomonas, herpes, HPV, and human immunodeficiency virus (HIV). Herpes, HIV, and HPV are viral illnesses that have no cure. They can result in disability, cancer, and death.  If you are at risk of being infected with HIV, it is recommended that you take a prescription medicine daily to prevent HIV infection. This is called preexposure prophylaxis (PrEP). You are considered at risk if:  You are a man who has sex with other men (MSM) and have other risk factors.  You are a heterosexual man, are sexually active, and are at increased risk for HIV infection.    You take drugs by injection.  You are sexually active with a partner who has HIV.  Talk with your health care provider about whether you are at high risk of being infected with HIV. If you choose to begin PrEP, you should first be tested for HIV. You should then be tested every 3 months for as long as you are taking PrEP.  A one-time screening for abdominal aortic aneurysm (AAA) and surgical repair of large AAAs by ultrasound are recommended for men ages 32 to 67 years who are current or former smokers.  Healthy men should no longer receive prostate-specific antigen (PSA) blood tests as part of routine cancer screening. Talk with your health care provider about prostate cancer screening.  Testicular cancer screening is not recommended for adult males who have no symptoms. Screening includes self-exam, a health care provider exam, and other screening tests. Consult with your health care provider about any symptoms  you have or any concerns you have about testicular cancer.  Use sunscreen. Apply sunscreen liberally and repeatedly throughout the day. You should seek shade when your shadow is shorter than you. Protect yourself by wearing long sleeves, pants, a wide-brimmed hat, and sunglasses year round, whenever you are outdoors.  Once a month, do a whole-body skin exam, using a mirror to look at the skin on your back. Tell your health care provider about new moles, moles that have irregular borders, moles that are larger than a pencil eraser, or moles that have changed in shape or color.  Stay current with required vaccines (immunizations).  Influenza vaccine. All adults should be immunized every year.  Tetanus, diphtheria, and acellular pertussis (Td, Tdap) vaccine. An adult who has not previously received Tdap or who does not know his vaccine status should receive 1 dose of Tdap. This initial dose should be followed by tetanus and diphtheria toxoids (Td) booster doses every 10 years. Adults with an unknown or incomplete history of completing a 3-dose immunization series with Td-containing vaccines should begin or complete a primary immunization series including a Tdap dose. Adults should receive a Td booster every 10 years.  Varicella vaccine. An adult without evidence of immunity to varicella should receive 2 doses or a second dose if he has previously received 1 dose.  Human papillomavirus (HPV) vaccine. Males aged 68-21 years who have not received the vaccine previously should receive the 3-dose series. Males aged 22-26 years may be immunized. Immunization is recommended through the age of 6 years for any male who has sex with males and did not get any or all doses earlier. Immunization is recommended for any person with an immunocompromised condition through the age of 49 years if he did not get any or all doses earlier. During the 3-dose series, the second dose should be obtained 4-8 weeks after the first  dose. The third dose should be obtained 24 weeks after the first dose and 16 weeks after the second dose.  Zoster vaccine. One dose is recommended for adults aged 50 years or older unless certain conditions are present.  Measles, mumps, and rubella (MMR) vaccine. Adults born before [birth year] generally are considered immune to measles and mumps. Adults born in [birth year] or later should have 1 or more doses of MMR vaccine unless there is a contraindication to the vaccine or there is laboratory evidence of immunity to each of the three diseases. A routine second dose of MMR vaccine should be obtained at least 28 days after the first dose for students attending postsecondary  schools, health care workers, or international travelers. People who received inactivated measles vaccine or an unknown type of measles vaccine during 1963-1967 should receive 2 doses of MMR vaccine. People who received inactivated mumps vaccine or an unknown type of mumps vaccine before 1979 and are at high risk for mumps infection should consider immunization with 2 doses of MMR vaccine. Unvaccinated health care workers born before 1957 who lack laboratory evidence of measles, mumps, or rubella immunity or laboratory confirmation of disease should consider measles and mumps immunization with 2 doses of MMR vaccine or rubella immunization with 1 dose of MMR vaccine.  Pneumococcal 13-valent conjugate (PCV13) vaccine. When indicated, a person who is uncertain of his immunization history and has no record of immunization should receive the PCV13 vaccine. An adult aged 19 years or older who has certain medical conditions and has not been previously immunized should receive 1 dose of PCV13 vaccine. This PCV13 should be followed with a dose of pneumococcal polysaccharide (PPSV23) vaccine. The PPSV23 vaccine dose should be obtained at least 8 weeks after the dose of PCV13 vaccine. An adult aged 19 years or older who has certain medical conditions and  previously received 1 or more doses of PPSV23 vaccine should receive 1 dose of PCV13. The PCV13 vaccine dose should be obtained 1 or more years after the last PPSV23 vaccine dose.  Pneumococcal polysaccharide (PPSV23) vaccine. When PCV13 is also indicated, PCV13 should be obtained first. All adults aged 65 years and older should be immunized. An adult younger than age 65 years who has certain medical conditions should be immunized. Any person who resides in a nursing home or long-term care facility should be immunized. An adult smoker should be immunized. People with an immunocompromised condition and certain other conditions should receive both PCV13 and PPSV23 vaccines. People with human immunodeficiency virus (HIV) infection should be immunized as soon as possible after diagnosis. Immunization during chemotherapy or radiation therapy should be avoided. Routine use of PPSV23 vaccine is not recommended for American Indians, [place] Natives, or people younger than 65 years unless there are medical conditions that require PPSV23 vaccine. When indicated, people who have unknown immunization and have no record of immunization should receive PPSV23 vaccine. One-time revaccination 5 years after the first dose of PPSV23 is recommended for people aged 19-64 years who have chronic kidney failure, nephrotic syndrome, asplenia, or immunocompromised conditions. People who received 1-2 doses of PPSV23 before age 65 years should receive another dose of PPSV23 vaccine at age 65 years or later if at least 5 years have passed since the previous dose. Doses of PPSV23 are not needed for people immunized with PPSV23 at or after age 65 years.  Meningococcal vaccine. Adults with asplenia or persistent complement component deficiencies should receive 2 doses of quadrivalent meningococcal conjugate (MenACWY-D) vaccine. The doses should be obtained at least 2 months apart. Microbiologists working with certain meningococcal bacteria,  military recruits, people at risk during an outbreak, and people who travel to or live in countries with a high rate of meningitis should be immunized. A first-year college student up through age 21 years who is living in a residence hall should receive a dose if he did not receive a dose on or after his 16th birthday. Adults who have certain high-risk conditions should receive one or more doses of vaccine.  Hepatitis A vaccine. Adults who wish to be protected from this disease, have certain high-risk conditions, work with hepatitis A-infected animals, work in hepatitis A research labs, or   travel to or work in countries with a high rate of hepatitis A should be immunized. Adults who were previously unvaccinated and who anticipate close contact with an international adoptee during the first 60 days after arrival in the [place] from a country with a high rate of hepatitis A should be immunized.  Hepatitis B vaccine. Adults should be immunized if they wish to be protected from this disease, have certain high-risk conditions, may be exposed to blood or other infectious body fluids, are household contacts or sex partners of hepatitis B positive people, are clients or workers in certain care facilities, or travel to or work in countries with a high rate of hepatitis B.  Haemophilus influenzae type b (Hib) vaccine. A previously unvaccinated person with asplenia or sickle cell disease or having a scheduled splenectomy should receive 1 dose of Hib vaccine. Regardless of previous immunization, a recipient of a hematopoietic stem cell transplant should receive a 3-dose series 6-12 months after his successful transplant. Hib vaccine is not recommended for adults with HIV infection. Preventive Service / Frequency Ages 52 to 17  Blood pressure check.** / Every 1 to 2 years.  Lipid and cholesterol check.** / Every 5 years beginning at age 69.  Hepatitis C blood test.** / For any individual with known risks for  hepatitis C.  Skin self-exam. / Monthly.  Influenza vaccine. / Every year.  Tetanus, diphtheria, and acellular pertussis (Tdap, Td) vaccine.** / Consult your health care provider. 1 dose of Td every 10 years.  Varicella vaccine.** / Consult your health care provider.  HPV vaccine. / 3 doses over 6 months, if 72 or younger.  Measles, mumps, rubella (MMR) vaccine.** / You need at least 1 dose of MMR if you were born in 1957 or later. You may also need a second dose.  Pneumococcal 13-valent conjugate (PCV13) vaccine.** / Consult your health care provider.  Pneumococcal polysaccharide (PPSV23) vaccine.** / 1 to 2 doses if you smoke cigarettes or if you have certain conditions.  Meningococcal vaccine.** / 1 dose if you are age 35 to 60 years and a Market researcher living in a residence hall, or have one of several medical conditions. You may also need additional booster doses.  Hepatitis A vaccine.** / Consult your health care provider.  Hepatitis B vaccine.** / Consult your health care provider.  Haemophilus influenzae type b (Hib) vaccine.** / Consult your health care provider. Ages 35 to 8  Blood pressure check.** / Every 1 to 2 years.  Lipid and cholesterol check.** / Every 5 years beginning at age 57.  Lung cancer screening. / Every year if you are aged 44-80 years and have a 30-pack-year history of smoking and currently smoke or have quit within the past 15 years. Yearly screening is stopped once you have quit smoking for at least 15 years or develop a health problem that would prevent you from having lung cancer treatment.  Fecal occult blood test (FOBT) of stool. / Every year beginning at age 55 and continuing until age 73. You may not have to do this test if you get a colonoscopy every 10 years.  Flexible sigmoidoscopy** or colonoscopy.** / Every 5 years for a flexible sigmoidoscopy or every 10 years for a colonoscopy beginning at age 28 and continuing until age  1.  Hepatitis C blood test.** / For all people born from [birth year] through 1965 and any individual with known risks for hepatitis C.  Skin self-exam. / Monthly.  Influenza vaccine. / Every  year.  Tetanus, diphtheria, and acellular pertussis (Tdap/Td) vaccine.** / Consult your health care provider. 1 dose of Td every 10 years.  Varicella vaccine.** / Consult your health care provider.  Zoster vaccine.** / 1 dose for adults aged 53 years or older.  Measles, mumps, rubella (MMR) vaccine.** / You need at least 1 dose of MMR if you were born in 1957 or later. You may also need a second dose.  Pneumococcal 13-valent conjugate (PCV13) vaccine.** / Consult your health care provider.  Pneumococcal polysaccharide (PPSV23) vaccine.** / 1 to 2 doses if you smoke cigarettes or if you have certain conditions.  Meningococcal vaccine.** / Consult your health care provider.  Hepatitis A vaccine.** / Consult your health care provider.  Hepatitis B vaccine.** / Consult your health care provider.  Haemophilus influenzae type b (Hib) vaccine.** / Consult your health care provider. Ages 77 and over  Blood pressure check.** / Every 1 to 2 years.  Lipid and cholesterol check.**/ Every 5 years beginning at age 85.  Lung cancer screening. / Every year if you are aged 55-80 years and have a 30-pack-year history of smoking and currently smoke or have quit within the past 15 years. Yearly screening is stopped once you have quit smoking for at least 15 years or develop a health problem that would prevent you from having lung cancer treatment.  Fecal occult blood test (FOBT) of stool. / Every year beginning at age 33 and continuing until age 11. You may not have to do this test if you get a colonoscopy every 10 years.  Flexible sigmoidoscopy** or colonoscopy.** / Every 5 years for a flexible sigmoidoscopy or every 10 years for a colonoscopy beginning at age 28 and continuing until age 73.  Hepatitis C blood  test.** / For all people born from [birth year] through 1964 and any individual with known risks for hepatitis C.  Abdominal aortic aneurysm (AAA) screening.** / A one-time screening for ages 50 to 27 years who are current or former smokers.  Skin self-exam. / Monthly.  Influenza vaccine. / Every year.  Tetanus, diphtheria, and acellular pertussis (Tdap/Td) vaccine.** / 1 dose of Td every 10 years.  Varicella vaccine.** / Consult your health care provider.  Zoster vaccine.** / 1 dose for adults aged 34 years or older.  Pneumococcal 13-valent conjugate (PCV13) vaccine.** / Consult your health care provider.  Pneumococcal polysaccharide (PPSV23) vaccine.** / 1 dose for all adults aged 63 years and older.  Meningococcal vaccine.** / Consult your health care provider.  Hepatitis A vaccine.** / Consult your health care provider.  Hepatitis B vaccine.** / Consult your health care provider.  Haemophilus influenzae type b (Hib) vaccine.** / Consult your health care provider. **Family history and personal history of risk and conditions may change your health care provider's recommendations. Document Released: 11/21/2001 Document Revised: 09/30/2013 Document Reviewed: 02/20/2011 [facility] Patient Information 2015 [place], [place]. This information is not intended to replace advice given to you by your health care provider. Make sure you discuss any questions you have with your health care provider.

## 2015-04-29 ENCOUNTER — Other Ambulatory Visit (INDEPENDENT_AMBULATORY_CARE_PROVIDER_SITE_OTHER): Payer: Medicare Other

## 2015-04-29 DIAGNOSIS — M159 Polyosteoarthritis, unspecified: Secondary | ICD-10-CM

## 2015-04-29 DIAGNOSIS — E538 Deficiency of other specified B group vitamins: Secondary | ICD-10-CM | POA: Diagnosis not present

## 2015-04-29 DIAGNOSIS — Z0000 Encounter for general adult medical examination without abnormal findings: Secondary | ICD-10-CM

## 2015-04-29 DIAGNOSIS — I10 Essential (primary) hypertension: Secondary | ICD-10-CM | POA: Diagnosis not present

## 2015-04-29 DIAGNOSIS — E119 Type 2 diabetes mellitus without complications: Secondary | ICD-10-CM

## 2015-04-29 DIAGNOSIS — N320 Bladder-neck obstruction: Secondary | ICD-10-CM | POA: Diagnosis not present

## 2015-04-29 DIAGNOSIS — M150 Primary generalized (osteo)arthritis: Secondary | ICD-10-CM

## 2015-04-29 LAB — LIPID PANEL
CHOL/HDL RATIO: 5
Cholesterol: 178 mg/dL (ref 0–200)
HDL: 37 mg/dL — AB (ref >39.00)
LDL CALC: 121 mg/dL — AB (ref 0–99)
NONHDL: 141
Triglycerides: 99 mg/dL (ref 0.0–149.0)
VLDL: 19.8 mg/dL (ref 0.0–40.0)

## 2015-04-29 LAB — URINALYSIS
BILIRUBIN URINE: NEGATIVE
Hgb urine dipstick: NEGATIVE
KETONES UR: NEGATIVE
Leukocytes, UA: NEGATIVE
Nitrite: NEGATIVE
PH: 6 (ref 5.0–8.0)
Specific Gravity, Urine: 1.025 (ref 1.000–1.030)
Total Protein, Urine: NEGATIVE
Urine Glucose: NEGATIVE
Urobilinogen, UA: 0.2 (ref 0.0–1.0)

## 2015-04-29 LAB — BASIC METABOLIC PANEL
BUN: 12 mg/dL (ref 6–23)
CALCIUM: 9.2 mg/dL (ref 8.4–10.5)
CO2: 26 meq/L (ref 19–32)
Chloride: 108 mEq/L (ref 96–112)
Creatinine, Ser: 0.94 mg/dL (ref 0.40–1.50)
GFR: 104.18 mL/min (ref >60.00)
Glucose, Bld: 113 mg/dL — ABNORMAL HIGH (ref 70–99)
POTASSIUM: 4 meq/L (ref 3.5–5.1)
SODIUM: 141 meq/L (ref 135–145)

## 2015-04-29 LAB — HEPATIC FUNCTION PANEL
ALBUMIN: 4 g/dL (ref 3.5–5.2)
ALT: 6 U/L (ref 0–53)
AST: 10 U/L (ref 0–37)
Alkaline Phosphatase: 50 U/L (ref 39–117)
BILIRUBIN TOTAL: 0.7 mg/dL (ref 0.2–1.2)
Bilirubin, Direct: 0.1 mg/dL (ref 0.0–0.3)
TOTAL PROTEIN: 6.4 g/dL (ref 6.0–8.3)

## 2015-04-29 LAB — PSA: PSA: 0.17 ng/mL (ref 0.10–4.00)

## 2015-04-29 LAB — VITAMIN B12: Vitamin B-12: 410 pg/mL (ref 211–911)

## 2015-04-29 LAB — TSH: TSH: 0.69 u[IU]/mL (ref 0.35–4.50)

## 2015-04-29 LAB — HEMOGLOBIN A1C: Hgb A1c MFr Bld: 5.4 % (ref 4.6–6.5)

## 2015-06-07 ENCOUNTER — Other Ambulatory Visit: Payer: Self-pay | Admitting: Internal Medicine

## 2015-06-12 ENCOUNTER — Other Ambulatory Visit: Payer: Self-pay | Admitting: Internal Medicine

## 2015-06-25 ENCOUNTER — Other Ambulatory Visit: Payer: Self-pay | Admitting: Internal Medicine

## 2015-07-17 ENCOUNTER — Other Ambulatory Visit: Payer: Self-pay | Admitting: Internal Medicine

## 2015-08-05 ENCOUNTER — Other Ambulatory Visit: Payer: Self-pay | Admitting: *Deleted

## 2015-08-05 MED ORDER — AMLODIPINE BESYLATE 5 MG PO TABS: 5.0000 mg | ORAL_TABLET | Freq: Every day | ORAL | Status: DC

## 2015-08-24 ENCOUNTER — Other Ambulatory Visit: Payer: Self-pay | Admitting: *Deleted

## 2015-08-24 MED ORDER — AMLODIPINE BESYLATE 5 MG PO TABS: 5.0000 mg | ORAL_TABLET | Freq: Every day | ORAL | Status: DC

## 2015-08-24 MED ORDER — BENAZEPRIL HCL 40 MG PO TABS: 40.0000 mg | ORAL_TABLET | Freq: Every day | ORAL | Status: DC

## 2015-08-24 MED ORDER — TRAZODONE HCL 50 MG PO TABS: 25.0000 mg | ORAL_TABLET | Freq: Every evening | ORAL | Status: DC | PRN

## 2015-08-30 ENCOUNTER — Ambulatory Visit (INDEPENDENT_AMBULATORY_CARE_PROVIDER_SITE_OTHER): Payer: Medicare Other | Admitting: Internal Medicine

## 2015-08-30 ENCOUNTER — Other Ambulatory Visit (INDEPENDENT_AMBULATORY_CARE_PROVIDER_SITE_OTHER): Payer: Medicare Other

## 2015-08-30 ENCOUNTER — Encounter: Payer: Self-pay | Admitting: Internal Medicine

## 2015-08-30 VITALS — BP 100/60 | HR 72 | Wt 252.0 lb

## 2015-08-30 DIAGNOSIS — Z23 Encounter for immunization: Secondary | ICD-10-CM

## 2015-08-30 DIAGNOSIS — I10 Essential (primary) hypertension: Secondary | ICD-10-CM

## 2015-08-30 DIAGNOSIS — E538 Deficiency of other specified B group vitamins: Secondary | ICD-10-CM

## 2015-08-30 DIAGNOSIS — K029 Dental caries, unspecified: Secondary | ICD-10-CM

## 2015-08-30 DIAGNOSIS — E119 Type 2 diabetes mellitus without complications: Secondary | ICD-10-CM

## 2015-08-30 LAB — HEMOGLOBIN A1C: Hgb A1c MFr Bld: 5.3 % (ref 4.6–6.5)

## 2015-08-30 LAB — HEPATITIS C ANTIBODY: HCV Ab: NEGATIVE

## 2015-08-30 LAB — BASIC METABOLIC PANEL
BUN: 9 mg/dL (ref 6–23)
CO2: 25 meq/L (ref 19–32)
Calcium: 9.7 mg/dL (ref 8.4–10.5)
Chloride: 105 mEq/L (ref 96–112)
Creatinine, Ser: 0.98 mg/dL (ref 0.40–1.50)
GFR: 99.18 mL/min (ref >60.00)
GLUCOSE: 68 mg/dL — AB (ref 70–99)
POTASSIUM: 4.4 meq/L (ref 3.5–5.1)
Sodium: 139 mEq/L (ref 135–145)

## 2015-08-30 MED ORDER — POTASSIUM CHLORIDE CRYS ER 20 MEQ PO TBCR
20.0000 meq | EXTENDED_RELEASE_TABLET | Freq: Every day | ORAL | Status: DC
Start: 2015-08-30 — End: 2016-03-27

## 2015-08-30 NOTE — Progress Notes (Signed)
Pre visit review using our clinic review tool, if applicable. No additional management support is needed unless otherwise documented below in the visit note. 

## 2015-08-30 NOTE — Assessment & Plan Note (Signed)
Metformin 

## 2015-08-30 NOTE — Assessment & Plan Note (Signed)
Amlodipine, Benazepril, Furosemide 

## 2015-08-30 NOTE — Assessment & Plan Note (Signed)
11/16 new full dentures

## 2015-08-30 NOTE — Progress Notes (Signed)
Subjective:  Patient ID: Brian Mccullough, male    DOB: 01/20/1951  Age: 64 y.o. MRN: [id number]  CC: No chief complaint on file.   HPI Brian Mccullough presents for f/u HTN, DM, OA f/u  Outpatient Prescriptions Prior to Visit  Medication Sig Dispense Refill  . amLODipine (NORVASC) 5 MG tablet Take 1 tablet (5 mg total) by mouth daily. 90 tablet 3  . aspirin 81 MG EC tablet Take 81 mg by mouth daily.      . benazepril (LOTENSIN) 40 MG tablet Take 1 tablet (40 mg total) by mouth daily. 90 tablet 3  . Cholecalciferol (VITAMIN D3) 1000 UNITS tablet Take 1,000 Units by mouth daily.     . ciprofloxacin (CIPRO) 500 MG tablet TAKE 1 TABLET BY MOUTH TWICE DAILY 20 tablet 0  . COCONUT OIL PO Take 1 tablet by mouth daily. 1000 mg    . cyanocobalamin 100 MCG tablet Take 1,000 mcg by mouth daily.     . furosemide (LASIX) 40 MG tablet Take 1 tablet (40 mg total) by mouth every other day. 30 tablet 5  . meloxicam (MOBIC) 15 MG tablet TAKE 1 TABLET BY MOUTH EVERY DAY AS NEEDED FOR PAIN 90 tablet 0  . metFORMIN (GLUCOPHAGE) 500 MG tablet TAKE 1 TABLET BY MOUTH EVERY DAY WITH BREAKFAST 90 tablet 3  . metoprolol tartrate (LOPRESSOR) 25 MG tablet Take 1-2 tablets (25-50 mg total) by mouth 2 (two) times daily as needed (palpitations). 180 tablet 3  . potassium chloride SA (K-DUR,KLOR-CON) 20 MEQ tablet Take 1 tablet (20 mEq total) by mouth daily. 90 tablet 3  . tiZANidine (ZANAFLEX) 4 MG tablet TAKE 1 TABLET BY MOUTH EVERY 8 HOURS AS NEEDED FOR MUSCLE SPASMS 90 tablet 2  . traMADol (ULTRAM) 50 MG tablet TAKE 1 TO 2 TABLETS BY MOUTH TWICE DAILY AS NEEDED 100 tablet 1  . traZODone (DESYREL) 50 MG tablet Take 0.5-1 tablets (25-50 mg total) by mouth at bedtime as needed for sleep. 90 tablet 1  . triamcinolone ointment (KENALOG) 0.1 % Apply 1 application topically 2 (two) times daily as needed (rash). 80 g 3   Facility-Administered Medications Prior to Visit  Medication Dose Route Frequency Provider Last Rate  Last Dose  . testosterone cypionate (DEPOTESTOTERONE CYPIONATE) injection 200 mg  200 mg Intramuscular Q14 Days Aleksei [name] V, MD        ROS Review of Systems  Constitutional: Negative for appetite change, fatigue and unexpected weight change.  HENT: Negative for congestion, nosebleeds, sneezing, sore throat and trouble swallowing.   Eyes: Negative for itching and visual disturbance.  Respiratory: Negative for cough.   Cardiovascular: Negative for chest pain, palpitations and leg swelling.  Gastrointestinal: Negative for nausea, diarrhea, blood in stool and abdominal distention.  Genitourinary: Negative for frequency and hematuria.  Musculoskeletal: Negative for back pain, joint swelling, gait problem and neck pain.  Skin: Negative for rash.  Neurological: Negative for dizziness, tremors, speech difficulty and weakness.  Psychiatric/Behavioral: Negative for sleep disturbance, dysphoric mood and agitation. The patient is not nervous/anxious.     Objective:  BP 100/60 mmHg  Pulse 72  Wt 252 lb (114.306 kg)  SpO2 99%  BP Readings from Last 3 Encounters:  08/30/15 100/60  04/28/15 144/80  12/30/14 106/68    Wt Readings from Last 3 Encounters:  08/30/15 252 lb (114.306 kg)  04/28/15 264 lb (119.75 kg)  12/30/14 263 lb (119.296 kg)    Physical Exam  Constitutional: He is oriented to  person, place, and time. He appears well-developed. No distress.  NAD  HENT:  Mouth/Throat: Oropharynx is clear and moist.  Eyes: Conjunctivae are normal. Pupils are equal, round, and reactive to light.  Neck: Normal range of motion. No JVD present. No thyromegaly present.  Cardiovascular: Normal rate, regular rhythm, normal heart sounds and intact distal pulses.  Exam reveals no gallop and no friction rub.   No murmur heard. Pulmonary/Chest: Effort normal and breath sounds normal. No respiratory distress. He has no wheezes. He has no rales. He exhibits no tenderness.  Abdominal: Soft. Bowel  sounds are normal. He exhibits no distension and no mass. There is no tenderness. There is no rebound and no guarding.  Musculoskeletal: Normal range of motion. He exhibits no edema or tenderness.  Lymphadenopathy:    He has no cervical adenopathy.  Neurological: He is alert and oriented to person, place, and time. He has normal reflexes. No cranial nerve deficit. He exhibits normal muscle tone. He displays a negative Romberg sign. Coordination and gait normal.  Skin: Skin is warm and dry. No rash noted.  Psychiatric: He has a normal mood and affect. His behavior is normal. Judgment and thought content normal.    Lab Results  Component Value Date   WBC 6.1 08/31/2014   HGB 14.6 08/31/2014   HCT 44.7 08/31/2014   PLT 187.0 08/31/2014   GLUCOSE 113* 04/29/2015   CHOL 178 04/29/2015   TRIG 99.0 04/29/2015   HDL 37.00* 04/29/2015   LDLDIRECT 88.1 01/23/2011   LDLCALC 121* 04/29/2015   ALT 6 04/29/2015   AST 10 04/29/2015   NA 141 04/29/2015   K 4.0 04/29/2015   CL 108 04/29/2015   CREATININE 0.94 04/29/2015   BUN 12 04/29/2015   CO2 26 04/29/2015   TSH 0.69 04/29/2015   PSA 0.17 04/29/2015   HGBA1C 5.4 04/29/2015   MICROALBUR 0.3 01/23/2011    No results found.  Assessment & Plan:   There are no diagnoses linked to this encounter. I am having Brian Mccullough maintain his aspirin, cyanocobalamin, cholecalciferol, furosemide, COCONUT OIL PO, potassium chloride SA, metFORMIN, metoprolol tartrate, triamcinolone ointment, traMADol, tiZANidine, meloxicam, ciprofloxacin, amLODipine, benazepril, and traZODone. We will continue to administer testosterone cypionate.  No orders of the defined types were placed in this encounter.     Follow-up: No Follow-up on file.  Walker Kehr, MD

## 2015-08-30 NOTE — Assessment & Plan Note (Signed)
On B12 

## 2015-10-06 ENCOUNTER — Other Ambulatory Visit: Payer: Self-pay | Admitting: Internal Medicine

## 2015-10-25 ENCOUNTER — Other Ambulatory Visit: Payer: Self-pay | Admitting: Internal Medicine

## 2015-10-25 NOTE — Telephone Encounter (Signed)
Please advise, thanks.

## 2015-10-29 NOTE — Telephone Encounter (Signed)
Tramadol rx called in to [facility]

## 2015-11-30 ENCOUNTER — Encounter: Payer: Self-pay | Admitting: Internal Medicine

## 2015-11-30 ENCOUNTER — Ambulatory Visit (INDEPENDENT_AMBULATORY_CARE_PROVIDER_SITE_OTHER): Payer: Medicare Other | Admitting: Internal Medicine

## 2015-11-30 ENCOUNTER — Other Ambulatory Visit (INDEPENDENT_AMBULATORY_CARE_PROVIDER_SITE_OTHER): Payer: Medicare Other

## 2015-11-30 VITALS — BP 100/68 | HR 73 | Wt 245.0 lb

## 2015-11-30 DIAGNOSIS — M5441 Lumbago with sciatica, right side: Secondary | ICD-10-CM

## 2015-11-30 DIAGNOSIS — I10 Essential (primary) hypertension: Secondary | ICD-10-CM

## 2015-11-30 DIAGNOSIS — M5442 Lumbago with sciatica, left side: Secondary | ICD-10-CM

## 2015-11-30 DIAGNOSIS — G8929 Other chronic pain: Secondary | ICD-10-CM

## 2015-11-30 DIAGNOSIS — E119 Type 2 diabetes mellitus without complications: Secondary | ICD-10-CM

## 2015-11-30 DIAGNOSIS — E538 Deficiency of other specified B group vitamins: Secondary | ICD-10-CM

## 2015-11-30 LAB — BASIC METABOLIC PANEL
BUN: 9 mg/dL (ref 6–23)
CHLORIDE: 107 meq/L (ref 96–112)
CO2: 28 mEq/L (ref 19–32)
Calcium: 9.7 mg/dL (ref 8.4–10.5)
Creatinine, Ser: 1.01 mg/dL (ref 0.40–1.50)
GFR: 95.71 mL/min (ref >60.00)
Glucose, Bld: 81 mg/dL (ref 70–99)
POTASSIUM: 4.7 meq/L (ref 3.5–5.1)
Sodium: 141 mEq/L (ref 135–145)

## 2015-11-30 LAB — HEMOGLOBIN A1C: HEMOGLOBIN A1C: 5.5 % (ref 4.6–6.5)

## 2015-11-30 MED ORDER — TRAMADOL HCL 50 MG PO TABS: ORAL_TABLET | ORAL | Status: DC

## 2015-11-30 NOTE — Assessment & Plan Note (Signed)
On Metformin labs 

## 2015-11-30 NOTE — Progress Notes (Signed)
Subjective:  Patient ID: Brian Mccullough, male    DOB: 08-Jan-1951  Age: 65 y.o. MRN: [id number]  CC: No chief complaint on file.   HPI Brian Mccullough presents for OA, DM2, HTN f/u  Outpatient Prescriptions Prior to Visit  Medication Sig Dispense Refill  . amLODipine (NORVASC) 5 MG tablet Take 1 tablet (5 mg total) by mouth daily. 90 tablet 3  . aspirin 81 MG EC tablet Take 81 mg by mouth daily.      . benazepril (LOTENSIN) 40 MG tablet Take 1 tablet (40 mg total) by mouth daily. 90 tablet 3  . Cholecalciferol (VITAMIN D3) 1000 UNITS tablet Take 1,000 Units by mouth daily.     . ciprofloxacin (CIPRO) 500 MG tablet TAKE 1 TABLET BY MOUTH TWICE DAILY 20 tablet 0  . COCONUT OIL PO Take 1 tablet by mouth daily. 1000 mg    . cyanocobalamin 100 MCG tablet Take 1,000 mcg by mouth daily.     . furosemide (LASIX) 40 MG tablet Take 1 tablet (40 mg total) by mouth every other day. 30 tablet 5  . meloxicam (MOBIC) 15 MG tablet TAKE 1 TABLET BY MOUTH EVERY DAY AS NEEDED FOR PAIN 90 tablet 0  . metFORMIN (GLUCOPHAGE) 500 MG tablet TAKE 1 TABLET BY MOUTH EVERY DAY WITH BREAKFAST 90 tablet 3  . metoprolol tartrate (LOPRESSOR) 25 MG tablet Take 1 to 2 tablets by  mouth two times daily as  needed for palpitations 360 tablet 3  . potassium chloride SA (K-DUR,KLOR-CON) 20 MEQ tablet Take 1 tablet (20 mEq total) by mouth daily. 90 tablet 3  . tiZANidine (ZANAFLEX) 4 MG tablet TAKE 1 TABLET BY MOUTH EVERY 8 HOURS AS NEEDED FOR MUSCLE SPASMS 90 tablet 2  . traMADol (ULTRAM) 50 MG tablet TAKE 1-2 TABLETS BY MOUTH TWICE DAILY AS NEEDED 100 tablet 0  . traZODone (DESYREL) 50 MG tablet Take 0.5-1 tablets (25-50 mg total) by mouth at bedtime as needed for sleep. 90 tablet 1  . triamcinolone ointment (KENALOG) 0.1 % Apply 1 application topically 2 (two) times daily as needed (rash). 80 g 3   Facility-Administered Medications Prior to Visit  Medication Dose Route Frequency Provider Last Rate Last Dose  .  testosterone cypionate (DEPOTESTOTERONE CYPIONATE) injection 200 mg  200 mg Intramuscular Q14 Days Aleksei [name] V, MD        ROS Review of Systems  Constitutional: Negative for appetite change, fatigue and unexpected weight change.  HENT: Negative for congestion, nosebleeds, sneezing, sore throat and trouble swallowing.   Eyes: Negative for itching and visual disturbance.  Respiratory: Negative for cough.   Cardiovascular: Negative for chest pain, palpitations and leg swelling.  Gastrointestinal: Negative for nausea, diarrhea, blood in stool and abdominal distention.  Genitourinary: Negative for frequency and hematuria.  Musculoskeletal: Positive for back pain and arthralgias. Negative for joint swelling, gait problem and neck pain.  Skin: Negative for rash.  Neurological: Negative for dizziness, tremors, speech difficulty and weakness.  Psychiatric/Behavioral: Negative for sleep disturbance, dysphoric mood and agitation. The patient is not nervous/anxious.     Objective:  BP 100/68 mmHg  Pulse 73  Wt 245 lb (111.131 kg)  SpO2 97%  BP Readings from Last 3 Encounters:  11/30/15 100/68  08/30/15 100/60  04/28/15 144/80    Wt Readings from Last 3 Encounters:  11/30/15 245 lb (111.131 kg)  08/30/15 252 lb (114.306 kg)  04/28/15 264 lb (119.75 kg)    Physical Exam  Constitutional: He is  oriented to person, place, and time. He appears well-developed. No distress.  NAD  HENT:  Mouth/Throat: Oropharynx is clear and moist.  Eyes: Conjunctivae are normal. Pupils are equal, round, and reactive to light.  Neck: Normal range of motion. No JVD present. No thyromegaly present.  Cardiovascular: Normal rate, regular rhythm, normal heart sounds and intact distal pulses.  Exam reveals no gallop and no friction rub.   No murmur heard. Pulmonary/Chest: Effort normal and breath sounds normal. No respiratory distress. He has no wheezes. He has no rales. He exhibits no tenderness.    Abdominal: Soft. Bowel sounds are normal. He exhibits no distension and no mass. There is no tenderness. There is no rebound and no guarding.  Musculoskeletal: Normal range of motion. He exhibits no edema or tenderness.  Lymphadenopathy:    He has no cervical adenopathy.  Neurological: He is alert and oriented to person, place, and time. He has normal reflexes. No cranial nerve deficit. He exhibits normal muscle tone. He displays a negative Romberg sign. Coordination and gait normal.  Skin: Skin is warm and dry. No rash noted.  Psychiatric: He has a normal mood and affect. His behavior is normal. Judgment and thought content normal.    Lab Results  Component Value Date   WBC 6.1 08/31/2014   HGB 14.6 08/31/2014   HCT 44.7 08/31/2014   PLT 187.0 08/31/2014   GLUCOSE 68* 08/30/2015   CHOL 178 04/29/2015   TRIG 99.0 04/29/2015   HDL 37.00* 04/29/2015   LDLDIRECT 88.1 01/23/2011   LDLCALC 121* 04/29/2015   ALT 6 04/29/2015   AST 10 04/29/2015   NA 139 08/30/2015   K 4.4 08/30/2015   CL 105 08/30/2015   CREATININE 0.98 08/30/2015   BUN 9 08/30/2015   CO2 25 08/30/2015   TSH 0.69 04/29/2015   PSA 0.17 04/29/2015   HGBA1C 5.3 08/30/2015   MICROALBUR 0.3 01/23/2011    No results found.  Assessment & Plan:   There are no diagnoses linked to this encounter. I am having Mr. Czarny maintain his aspirin, cyanocobalamin, cholecalciferol, furosemide, COCONUT OIL PO, metFORMIN, triamcinolone ointment, tiZANidine, meloxicam, ciprofloxacin, amLODipine, benazepril, traZODone, potassium chloride SA, metoprolol tartrate, traMADol, amoxicillin, and chlorhexidine. We will continue to administer testosterone cypionate.  Meds ordered this encounter  Medications  . amoxicillin (AMOXIL) 500 MG capsule    Sig: TK ONE C PO  TID FOR 7 DAYS    Refill:  0  . chlorhexidine (PERIDEX) 0.12 % solution    Sig:     Refill:  0     Follow-up: No Follow-up on file.  Walker Kehr, MD

## 2015-11-30 NOTE — Assessment & Plan Note (Signed)
Tramadol prn ° Potential benefits of a long term opioids use as well as potential risks (i.e. addiction risk, apnea etc) and complications (i.e. Somnolence, constipation and others) were explained to the patient and were aknowledged. ° ° °

## 2015-11-30 NOTE — Progress Notes (Signed)
Pre visit review using our clinic review tool, if applicable. No additional management support is needed unless otherwise documented below in the visit note. 

## 2015-11-30 NOTE — Assessment & Plan Note (Signed)
On B12 

## 2015-12-22 ENCOUNTER — Other Ambulatory Visit: Payer: Self-pay | Admitting: Internal Medicine

## 2015-12-27 ENCOUNTER — Other Ambulatory Visit: Payer: Self-pay

## 2015-12-27 MED ORDER — TRAZODONE HCL 50 MG PO TABS: 25.0000 mg | ORAL_TABLET | Freq: Every evening | ORAL | Status: DC | PRN

## 2016-03-25 ENCOUNTER — Other Ambulatory Visit: Payer: Self-pay | Admitting: Internal Medicine

## 2016-03-27 ENCOUNTER — Encounter: Payer: Self-pay | Admitting: Internal Medicine

## 2016-03-27 ENCOUNTER — Ambulatory Visit (INDEPENDENT_AMBULATORY_CARE_PROVIDER_SITE_OTHER): Payer: Medicare Other | Admitting: Internal Medicine

## 2016-03-27 ENCOUNTER — Other Ambulatory Visit (INDEPENDENT_AMBULATORY_CARE_PROVIDER_SITE_OTHER): Payer: Medicare Other

## 2016-03-27 VITALS — BP 90/64 | HR 73 | Wt 246.0 lb

## 2016-03-27 DIAGNOSIS — G8929 Other chronic pain: Secondary | ICD-10-CM

## 2016-03-27 DIAGNOSIS — M5442 Lumbago with sciatica, left side: Secondary | ICD-10-CM

## 2016-03-27 DIAGNOSIS — Z638 Other specified problems related to primary support group: Secondary | ICD-10-CM

## 2016-03-27 DIAGNOSIS — E119 Type 2 diabetes mellitus without complications: Secondary | ICD-10-CM | POA: Diagnosis not present

## 2016-03-27 DIAGNOSIS — I10 Essential (primary) hypertension: Secondary | ICD-10-CM

## 2016-03-27 DIAGNOSIS — Z0000 Encounter for general adult medical examination without abnormal findings: Secondary | ICD-10-CM

## 2016-03-27 DIAGNOSIS — M5441 Lumbago with sciatica, right side: Secondary | ICD-10-CM

## 2016-03-27 DIAGNOSIS — F439 Reaction to severe stress, unspecified: Secondary | ICD-10-CM

## 2016-03-27 DIAGNOSIS — N320 Bladder-neck obstruction: Secondary | ICD-10-CM | POA: Diagnosis not present

## 2016-03-27 DIAGNOSIS — E538 Deficiency of other specified B group vitamins: Secondary | ICD-10-CM | POA: Diagnosis not present

## 2016-03-27 LAB — BASIC METABOLIC PANEL
BUN: 12 mg/dL (ref 6–23)
CALCIUM: 9.5 mg/dL (ref 8.4–10.5)
CO2: 24 mEq/L (ref 19–32)
Chloride: 107 mEq/L (ref 96–112)
Creatinine, Ser: 0.98 mg/dL (ref 0.40–1.50)
GFR: 99 mL/min (ref >60.00)
GLUCOSE: 87 mg/dL (ref 70–99)
POTASSIUM: 4.2 meq/L (ref 3.5–5.1)
Sodium: 140 mEq/L (ref 135–145)

## 2016-03-27 LAB — LIPID PANEL
CHOL/HDL RATIO: 5
Cholesterol: 175 mg/dL (ref 0–200)
HDL: 33.6 mg/dL — AB (ref >39.00)
LDL CALC: 120 mg/dL — AB (ref 0–99)
NonHDL: 141.59
TRIGLYCERIDES: 109 mg/dL (ref 0.0–149.0)
VLDL: 21.8 mg/dL (ref 0.0–40.0)

## 2016-03-27 LAB — CBC WITH DIFFERENTIAL/PLATELET
BASOS ABS: 0 10*3/uL (ref 0.0–0.1)
Basophils Relative: 0.8 % (ref 0.0–3.0)
EOS ABS: 0.2 10*3/uL (ref 0.0–0.7)
Eosinophils Relative: 4.7 % (ref 0.0–5.0)
HCT: 44.8 % (ref 39.0–52.0)
Hemoglobin: 15.3 g/dL (ref 13.0–17.0)
LYMPHS ABS: 2 10*3/uL (ref 0.7–4.0)
Lymphocytes Relative: 41.3 % (ref 12.0–46.0)
MCHC: 34.2 g/dL (ref 30.0–36.0)
MCV: 88.6 fl (ref 78.0–100.0)
MONO ABS: 0.4 10*3/uL (ref 0.1–1.0)
Monocytes Relative: 8.4 % (ref 3.0–12.0)
NEUTROS PCT: 44.8 % (ref 43.0–77.0)
Neutro Abs: 2.2 10*3/uL (ref 1.4–7.7)
PLATELETS: 188 10*3/uL (ref 150.0–400.0)
RBC: 5.06 Mil/uL (ref 4.22–5.81)
RDW: 13.2 % (ref 11.5–15.5)
WBC: 4.9 10*3/uL (ref 4.0–10.5)

## 2016-03-27 LAB — HEPATIC FUNCTION PANEL
ALBUMIN: 4.1 g/dL (ref 3.5–5.2)
ALK PHOS: 49 U/L (ref 39–117)
ALT: 6 U/L (ref 0–53)
AST: 10 U/L (ref 0–37)
Bilirubin, Direct: 0.1 mg/dL (ref 0.0–0.3)
TOTAL PROTEIN: 6.8 g/dL (ref 6.0–8.3)
Total Bilirubin: 0.6 mg/dL (ref 0.2–1.2)

## 2016-03-27 LAB — URINALYSIS
Hgb urine dipstick: NEGATIVE
Ketones, ur: 15 — AB
Leukocytes, UA: NEGATIVE
NITRITE: NEGATIVE
PH: 5.5 (ref 5.0–8.0)
Specific Gravity, Urine: >=1.03 — AB (ref 1.000–1.030)
URINE GLUCOSE: NEGATIVE
Urobilinogen, UA: 1 (ref 0.0–1.0)

## 2016-03-27 LAB — HEMOGLOBIN A1C: Hgb A1c MFr Bld: 5.2 % (ref 4.6–6.5)

## 2016-03-27 LAB — PSA: PSA: 0.25 ng/mL (ref 0.10–4.00)

## 2016-03-27 LAB — TSH: TSH: 0.63 u[IU]/mL (ref 0.35–4.50)

## 2016-03-27 MED ORDER — TIZANIDINE HCL 4 MG PO TABS: 4.0000 mg | ORAL_TABLET | Freq: Three times a day (TID) | ORAL | Status: DC | PRN

## 2016-03-27 MED ORDER — MELOXICAM 15 MG PO TABS: ORAL_TABLET | ORAL | Status: DC

## 2016-03-27 MED ORDER — AMLODIPINE BESYLATE 5 MG PO TABS: 5.0000 mg | ORAL_TABLET | Freq: Every day | ORAL | Status: DC

## 2016-03-27 MED ORDER — TRAZODONE HCL 50 MG PO TABS: 25.0000 mg | ORAL_TABLET | Freq: Every evening | ORAL | Status: DC | PRN

## 2016-03-27 MED ORDER — BENAZEPRIL HCL 40 MG PO TABS: 40.0000 mg | ORAL_TABLET | Freq: Every day | ORAL | Status: DC

## 2016-03-27 MED ORDER — METOPROLOL TARTRATE 25 MG PO TABS: ORAL_TABLET | ORAL | Status: DC

## 2016-03-27 MED ORDER — POTASSIUM CHLORIDE CRYS ER 20 MEQ PO TBCR: 20.0000 meq | EXTENDED_RELEASE_TABLET | Freq: Every day | ORAL | Status: DC

## 2016-03-27 NOTE — Progress Notes (Signed)
Subjective:  Patient ID: Brian Mccullough, male    DOB: August 18, 1951  Age: 65 y.o. MRN: [id number]  CC: No chief complaint on file.   HPI Brian Mccullough presents for DM2, HTN, B12 def f/u. C/o stress - wife's brother is terminally ill  Outpatient Prescriptions Prior to Visit  Medication Sig Dispense Refill  . amLODipine (NORVASC) 5 MG tablet Take 1 tablet (5 mg total) by mouth daily. 90 tablet 3  . aspirin 81 MG EC tablet Take 81 mg by mouth daily.      . benazepril (LOTENSIN) 40 MG tablet Take 1 tablet (40 mg total) by mouth daily. 90 tablet 3  . chlorhexidine (PERIDEX) 0.12 % solution   0  . Cholecalciferol (VITAMIN D3) 1000 UNITS tablet Take 1,000 Units by mouth daily.     . COCONUT OIL PO Take 1 tablet by mouth daily. 1000 mg    . cyanocobalamin 100 MCG tablet Take 1,000 mcg by mouth daily.     . furosemide (LASIX) 40 MG tablet Take 1 tablet (40 mg total) by mouth every other day. 30 tablet 5  . meloxicam (MOBIC) 15 MG tablet TAKE 1 TABLET BY MOUTH EVERY DAY AS NEEDED FOR PAIN 90 tablet 0  . metFORMIN (GLUCOPHAGE) 500 MG tablet Take 1 tablet by mouth  every day with breakfast 90 tablet 3  . metoprolol tartrate (LOPRESSOR) 25 MG tablet Take 1 to 2 tablets by  mouth two times daily as  needed for palpitations 360 tablet 3  . potassium chloride SA (K-DUR,KLOR-CON) 20 MEQ tablet Take 1 tablet (20 mEq total) by mouth daily. 90 tablet 3  . traMADol (ULTRAM) 50 MG tablet TAKE 1-2 TABLETS BY MOUTH TWICE DAILY AS NEEDED 100 tablet 3  . traZODone (DESYREL) 50 MG tablet Take 0.5-1 tablets (25-50 mg total) by mouth at bedtime as needed for sleep. 90 tablet 0  . triamcinolone ointment (KENALOG) 0.1 % Apply 1 application topically 2 (two) times daily as needed (rash). 80 g 3  . tiZANidine (ZANAFLEX) 4 MG tablet TAKE 1 TABLET BY MOUTH EVERY 8 HOURS AS NEEDED FOR MUSCLE SPASMS 90 tablet 2   Facility-Administered Medications Prior to Visit  Medication Dose Route Frequency Provider Last Rate Last  Dose  . testosterone cypionate (DEPOTESTOTERONE CYPIONATE) injection 200 mg  200 mg Intramuscular Q14 Days Cassandria Anger, MD        ROS Review of Systems  Constitutional: Positive for fatigue. Negative for appetite change and unexpected weight change.  HENT: Negative for congestion, nosebleeds, sneezing, sore throat and trouble swallowing.   Eyes: Negative for itching and visual disturbance.  Respiratory: Negative for cough.   Cardiovascular: Negative for chest pain, palpitations and leg swelling.  Gastrointestinal: Negative for nausea, diarrhea, blood in stool and abdominal distention.  Genitourinary: Negative for frequency and hematuria.  Musculoskeletal: Negative for back pain, joint swelling, gait problem and neck pain.  Skin: Negative for rash.  Neurological: Negative for dizziness, tremors, speech difficulty and weakness.  Psychiatric/Behavioral: Positive for sleep disturbance. Negative for dysphoric mood and agitation. The patient is nervous/anxious.     Objective:  BP 90/64 mmHg  Pulse 73  Wt 246 lb (111.585 kg)  SpO2 97%  BP Readings from Last 3 Encounters:  03/27/16 90/64  11/30/15 100/68  08/30/15 100/60    Wt Readings from Last 3 Encounters:  03/27/16 246 lb (111.585 kg)  11/30/15 245 lb (111.131 kg)  08/30/15 252 lb (114.306 kg)    Physical Exam  Constitutional:  He is oriented to person, place, and time. He appears well-developed. No distress.  NAD  HENT:  Mouth/Throat: Oropharynx is clear and moist.  Eyes: Conjunctivae are normal. Pupils are equal, round, and reactive to light.  Neck: Normal range of motion. No JVD present. No thyromegaly present.  Cardiovascular: Normal rate, regular rhythm, normal heart sounds and intact distal pulses.  Exam reveals no gallop and no friction rub.   No murmur heard. Pulmonary/Chest: Effort normal and breath sounds normal. No respiratory distress. He has no wheezes. He has no rales. He exhibits no tenderness.    Abdominal: Soft. Bowel sounds are normal. He exhibits no distension and no mass. There is no tenderness. There is no rebound and no guarding.  Musculoskeletal: Normal range of motion. He exhibits tenderness. He exhibits no edema.  Lymphadenopathy:    He has no cervical adenopathy.  Neurological: He is alert and oriented to person, place, and time. He has normal reflexes. No cranial nerve deficit. He exhibits normal muscle tone. He displays a negative Romberg sign. Coordination and gait normal.  Skin: Skin is warm and dry. No rash noted.  Psychiatric: He has a normal mood and affect. His behavior is normal. Judgment and thought content normal.  L knee w/a scar Neuro - nonfocal Romberg (-)  Lab Results  Component Value Date   WBC 6.1 08/31/2014   HGB 14.6 08/31/2014   HCT 44.7 08/31/2014   PLT 187.0 08/31/2014   GLUCOSE 81 11/30/2015   CHOL 178 04/29/2015   TRIG 99.0 04/29/2015   HDL 37.00* 04/29/2015   LDLDIRECT 88.1 01/23/2011   LDLCALC 121* 04/29/2015   ALT 6 04/29/2015   AST 10 04/29/2015   NA 141 11/30/2015   K 4.7 11/30/2015   CL 107 11/30/2015   CREATININE 1.01 11/30/2015   BUN 9 11/30/2015   CO2 28 11/30/2015   TSH 0.69 04/29/2015   PSA 0.17 04/29/2015   HGBA1C 5.5 11/30/2015   MICROALBUR 0.3 01/23/2011    No results found.  Assessment & Plan:   Diagnoses and all orders for this visit:  Essential hypertension  B12 deficiency  Controlled type 2 diabetes mellitus without complication, without long-term current use of insulin (HCC)  Chronic bilateral low back pain with bilateral sciatica  Stress at home  Other orders -     tiZANidine (ZANAFLEX) 4 MG tablet; Take 1 tablet (4 mg total) by mouth every 8 (eight) hours as needed for muscle spasms. -     benazepril (LOTENSIN) 40 MG tablet; Take 1 tablet (40 mg total) by mouth daily. -     amLODipine (NORVASC) 5 MG tablet; Take 1 tablet (5 mg total) by mouth daily. -     potassium chloride SA (K-DUR,KLOR-CON) 20  MEQ tablet; Take 1 tablet (20 mEq total) by mouth daily. -     metoprolol tartrate (LOPRESSOR) 25 MG tablet;  -     traZODone (DESYREL) 50 MG tablet; Take 0.5-1 tablets (25-50 mg total) by mouth at bedtime as needed for sleep. -     meloxicam (MOBIC) 15 MG tablet;    I have changed Mr. Walla's tiZANidine. I am also having him maintain his aspirin, cyanocobalamin, cholecalciferol, furosemide, COCONUT OIL PO, triamcinolone ointment, meloxicam, amLODipine, benazepril, potassium chloride SA, metoprolol tartrate, chlorhexidine, traMADol, metFORMIN, and traZODone. We will continue to administer testosterone cypionate.  Meds ordered this encounter  Medications  . tiZANidine (ZANAFLEX) 4 MG tablet    Sig: Take 1 tablet (4 mg total) by mouth every 8 (  eight) hours as needed for muscle spasms.    Dispense:  90 tablet    Refill:  2     Follow-up: No Follow-up on file.  Walker Kehr, MD

## 2016-03-27 NOTE — Assessment & Plan Note (Signed)
On B12 

## 2016-03-27 NOTE — Assessment & Plan Note (Signed)
On Metformin 

## 2016-03-27 NOTE — Assessment & Plan Note (Signed)
Amlodipine, Benazepril, Furosemide 

## 2016-03-27 NOTE — Progress Notes (Signed)
Pre visit review using our clinic review tool, if applicable. No additional management support is needed unless otherwise documented below in the visit note. 

## 2016-03-27 NOTE — Assessment & Plan Note (Signed)
6/17 wife's brother is terminally ill

## 2016-03-27 NOTE — Assessment & Plan Note (Signed)
Tramadol prn ° Potential benefits of a long term opioids use as well as potential risks (i.e. addiction risk, apnea etc) and complications (i.e. Somnolence, constipation and others) were explained to the patient and were aknowledged. ° ° °

## 2016-05-15 ENCOUNTER — Other Ambulatory Visit: Payer: Self-pay | Admitting: Internal Medicine

## 2016-06-02 ENCOUNTER — Other Ambulatory Visit: Payer: Self-pay | Admitting: Internal Medicine

## 2016-06-08 NOTE — Telephone Encounter (Signed)
Done

## 2016-06-26 ENCOUNTER — Other Ambulatory Visit: Payer: Self-pay | Admitting: Internal Medicine

## 2016-06-30 ENCOUNTER — Other Ambulatory Visit: Payer: Self-pay | Admitting: *Deleted

## 2016-06-30 MED ORDER — TRIAMCINOLONE ACETONIDE 0.1 % EX OINT: 1.0000 "application " | TOPICAL_OINTMENT | Freq: Two times a day (BID) | CUTANEOUS | 0 refills | Status: DC | PRN

## 2016-07-27 ENCOUNTER — Encounter: Payer: Medicare Other | Admitting: Internal Medicine

## 2016-08-01 ENCOUNTER — Ambulatory Visit (INDEPENDENT_AMBULATORY_CARE_PROVIDER_SITE_OTHER): Payer: Medicare Other | Admitting: Internal Medicine

## 2016-08-01 ENCOUNTER — Other Ambulatory Visit (INDEPENDENT_AMBULATORY_CARE_PROVIDER_SITE_OTHER): Payer: Medicare Other

## 2016-08-01 ENCOUNTER — Encounter: Payer: Self-pay | Admitting: Internal Medicine

## 2016-08-01 VITALS — BP 100/60 | HR 61 | Temp 98.9°F | Ht 75.0 in | Wt 236.0 lb

## 2016-08-01 DIAGNOSIS — Z23 Encounter for immunization: Secondary | ICD-10-CM | POA: Diagnosis not present

## 2016-08-01 DIAGNOSIS — I10 Essential (primary) hypertension: Secondary | ICD-10-CM | POA: Diagnosis not present

## 2016-08-01 DIAGNOSIS — Z0000 Encounter for general adult medical examination without abnormal findings: Secondary | ICD-10-CM | POA: Diagnosis not present

## 2016-08-01 DIAGNOSIS — E119 Type 2 diabetes mellitus without complications: Secondary | ICD-10-CM | POA: Diagnosis not present

## 2016-08-01 DIAGNOSIS — E559 Vitamin D deficiency, unspecified: Secondary | ICD-10-CM

## 2016-08-01 DIAGNOSIS — E538 Deficiency of other specified B group vitamins: Secondary | ICD-10-CM | POA: Diagnosis not present

## 2016-08-01 LAB — MICROALBUMIN / CREATININE URINE RATIO
CREATININE, U: 198.6 mg/dL
MICROALB UR: 0.7 mg/dL (ref 0.0–1.9)
Microalb Creat Ratio: 0.4 mg/g (ref 0.0–30.0)

## 2016-08-01 LAB — BASIC METABOLIC PANEL
BUN: 11 mg/dL (ref 6–23)
CALCIUM: 9.9 mg/dL (ref 8.4–10.5)
CO2: 29 mEq/L (ref 19–32)
Chloride: 108 mEq/L (ref 96–112)
Creatinine, Ser: 0.94 mg/dL (ref 0.40–1.50)
GFR: 103.77 mL/min (ref >60.00)
Glucose, Bld: 90 mg/dL (ref 70–99)
Potassium: 4.6 mEq/L (ref 3.5–5.1)
SODIUM: 141 meq/L (ref 135–145)

## 2016-08-01 LAB — HEMOGLOBIN A1C: Hgb A1c MFr Bld: 5.3 % (ref 4.6–6.5)

## 2016-08-01 MED ORDER — TRAMADOL HCL 50 MG PO TABS: ORAL_TABLET | ORAL | 2 refills | Status: DC

## 2016-08-01 NOTE — Assessment & Plan Note (Signed)
Low BP - d/c Norvasc

## 2016-08-01 NOTE — Patient Instructions (Signed)

## 2016-08-01 NOTE — Assessment & Plan Note (Signed)
On Vit D 

## 2016-08-01 NOTE — Assessment & Plan Note (Signed)
Here for medicare wellness/physical  Diet: heart healthy - lost wt due to new dentures Physical activity: not sedentary  Depression/mood screen: negative  Hearing: intact to whispered voice  Visual acuity: grossly normal, performs annual eye exam  ADLs: capable  Fall risk: none  Home safety: good  Cognitive evaluation: intact to orientation, naming, recall and repetition  EOL planning: adv directives, full code/ I agree  I have personally reviewed and have noted  1. The patient's medical and social history  2. Their use of alcohol, tobacco or illicit drugs  3. Their current medications and supplements  4. The patient's functional ability including ADL's, fall risks, home safety risks and hearing or visual impairment.  5. Diet and physical activities  6. Evidence for depression or mood disorders 7. Providers roster reviewed   Today patient counseled on age appropriate routine health concerns for screening and prevention, each reviewed and up to date or declined. Immunizations reviewed and up to date or declined. Labs ordered and reviewed. Risk factors for depression reviewed and negative. Hearing function and visual acuity are intact. ADLs screened and addressed as needed. Functional ability and level of safety reviewed and appropriate. Education, counseling and referrals performed based on assessed risks today. Patient provided with a copy of personalized plan for preventive services.

## 2016-08-01 NOTE — Assessment & Plan Note (Signed)
Lost 

## 2016-08-01 NOTE — Addendum Note (Signed)
Addended by: Cresenciano Lick on: 08/01/2016 09:37 AM   Modules accepted: Orders

## 2016-08-01 NOTE — Assessment & Plan Note (Signed)
Chronic. 

## 2016-08-01 NOTE — Progress Notes (Signed)
Pre visit review using our clinic review tool, if applicable. No additional management support is needed unless otherwise documented below in the visit note. 

## 2016-08-01 NOTE — Progress Notes (Signed)
Subjective:  Patient ID: Brian Mccullough, male    DOB: 02-Apr-1951  Age: 65 y.o. MRN: [id number]  CC: No chief complaint on file.   HPI Brian Mccullough presents for a well exam and a HTN, DM, OA f/u. Loosing wt due to dentures adaptation  Outpatient Medications Prior to Visit  Medication Sig Dispense Refill  . amLODipine (NORVASC) 5 MG tablet Take 1 tablet (5 mg total) by mouth daily. 90 tablet 3  . aspirin 81 MG EC tablet Take 81 mg by mouth daily.      . benazepril (LOTENSIN) 40 MG tablet Take 1 tablet (40 mg total) by mouth daily. 90 tablet 3  . chlorhexidine (PERIDEX) 0.12 % solution   0  . Cholecalciferol (VITAMIN D3) 1000 UNITS tablet Take 1,000 Units by mouth daily.     . COCONUT OIL PO Take 1 tablet by mouth daily. 1000 mg    . cyanocobalamin 100 MCG tablet Take 1,000 mcg by mouth daily.     . furosemide (LASIX) 40 MG tablet Take 1 tablet (40 mg total) by mouth every other day. 30 tablet 5  . meloxicam (MOBIC) 15 MG tablet TAKE 1 TABLET BY MOUTH EVERY DAY AS NEEDED FOR PAIN 90 tablet 1  . metFORMIN (GLUCOPHAGE) 500 MG tablet Take 1 tablet by mouth  every day with breakfast 90 tablet 3  . metoprolol tartrate (LOPRESSOR) 25 MG tablet Take 1 to 2 tablets by  mouth two times daily as  needed for palpitations 360 tablet 3  . potassium chloride SA (K-DUR,KLOR-CON) 20 MEQ tablet Take 1 tablet (20 mEq total) by mouth daily. 90 tablet 3  . tiZANidine (ZANAFLEX) 4 MG tablet TAKE 1 TABLET(4 MG) BY MOUTH EVERY 8 HOURS AS NEEDED FOR MUSCLE SPASMS 270 tablet 0  . traMADol (ULTRAM) 50 MG tablet TAKE 1-2 TABLETS BY MOUTH TWICE DAILY AS NEEDED 100 tablet 2  . traZODone (DESYREL) 50 MG tablet TAKE 0.5-1 TABLETS BY MOUTH AT BEDTIME AS NEEDED FOR  SLEEP. 90 tablet 3  . triamcinolone ointment (KENALOG) 0.1 % Apply 1 application topically 2 (two) times daily as needed (rash). 80 g 0   Facility-Administered Medications Prior to Visit  Medication Dose Route Frequency Provider Last Rate Last Dose  .  testosterone cypionate (DEPOTESTOTERONE CYPIONATE) injection 200 mg  200 mg Intramuscular Q14 Days Cassandria Anger, MD        ROS Review of Systems  Constitutional: Negative for appetite change, fatigue and unexpected weight change.  HENT: Negative for congestion, nosebleeds, sneezing, sore throat and trouble swallowing.   Eyes: Negative for itching and visual disturbance.  Respiratory: Negative for cough.   Cardiovascular: Negative for chest pain, palpitations and leg swelling.  Gastrointestinal: Negative for abdominal distention, blood in stool, diarrhea and nausea.  Genitourinary: Negative for frequency and hematuria.  Musculoskeletal: Positive for arthralgias, back pain and gait problem. Negative for joint swelling and neck pain.  Skin: Negative for rash.  Neurological: Negative for dizziness, tremors, speech difficulty and weakness.  Psychiatric/Behavioral: Negative for agitation, dysphoric mood and sleep disturbance. The patient is not nervous/anxious.     Objective:  BP 100/60   Pulse 61   Temp 98.9 F (37.2 C) (Oral)   Ht 6\' 3"  (1.905 m)   Wt 236 lb (107 kg)   SpO2 96%   BMI 29.50 kg/m   BP Readings from Last 3 Encounters:  08/01/16 100/60  03/27/16 90/64  11/30/15 100/68    Wt Readings from Last 3 Encounters:  08/01/16 236 lb (107 kg)  03/27/16 246 lb (111.6 kg)  11/30/15 245 lb (111.1 kg)    Physical Exam  Constitutional: He is oriented to person, place, and time. He appears well-developed and well-nourished. No distress.  HENT:  Head: Normocephalic and atraumatic.  Right Ear: External ear normal.  Left Ear: External ear normal.  Nose: Nose normal.  Mouth/Throat: Oropharynx is clear and moist. No oropharyngeal exudate.  Eyes: Conjunctivae and EOM are normal. Pupils are equal, round, and reactive to light. Right eye exhibits no discharge. Left eye exhibits no discharge. No scleral icterus.  Neck: Normal range of motion. Neck supple. No JVD present. No  tracheal deviation present. No thyromegaly present.  Cardiovascular: Normal rate, regular rhythm, normal heart sounds and intact distal pulses.  Exam reveals no gallop and no friction rub.   No murmur heard. Pulmonary/Chest: Effort normal and breath sounds normal. No stridor. No respiratory distress. He has no wheezes. He has no rales. He exhibits no tenderness.  Abdominal: Soft. Bowel sounds are normal. He exhibits no distension and no mass. There is no tenderness. There is no rebound and no guarding.  Genitourinary: Rectum normal and penis normal. Rectal exam shows guaiac negative stool. No penile tenderness.  Musculoskeletal: Normal range of motion. He exhibits no edema or tenderness.  Lymphadenopathy:    He has no cervical adenopathy.  Neurological: He is alert and oriented to person, place, and time. He has normal reflexes. No cranial nerve deficit. He exhibits normal muscle tone. Coordination normal.  Skin: Skin is warm and dry. No rash noted. He is not diaphoretic. No erythema. No pallor.  Psychiatric: He has a normal mood and affect. His behavior is normal. Judgment and thought content normal.  prostate 1+  Lab Results  Component Value Date   WBC 4.9 03/27/2016   HGB 15.3 03/27/2016   HCT 44.8 03/27/2016   PLT 188.0 03/27/2016   GLUCOSE 87 03/27/2016   CHOL 175 03/27/2016   TRIG 109.0 03/27/2016   HDL 33.60 (L) 03/27/2016   LDLDIRECT 88.1 01/23/2011   LDLCALC 120 (H) 03/27/2016   ALT 6 03/27/2016   AST 10 03/27/2016   NA 140 03/27/2016   K 4.2 03/27/2016   CL 107 03/27/2016   CREATININE 0.98 03/27/2016   BUN 12 03/27/2016   CO2 24 03/27/2016   TSH 0.63 03/27/2016   PSA 0.25 03/27/2016   HGBA1C 5.2 03/27/2016   MICROALBUR 0.3 01/23/2011    No results found.  Assessment & Plan:   There are no diagnoses linked to this encounter. I am having [name] maintain his aspirin, cyanocobalamin, cholecalciferol, furosemide, COCONUT OIL PO, chlorhexidine, metFORMIN,  benazepril, amLODipine, potassium chloride SA, metoprolol tartrate, meloxicam, traZODone, traMADol, tiZANidine, and triamcinolone ointment. We will continue to administer testosterone cypionate.  No orders of the defined types were placed in this encounter.    Follow-up: No Follow-up on file.  Walker Kehr, MD

## 2016-08-02 LAB — HEPATITIS C ANTIBODY: HCV Ab: NEGATIVE

## 2016-10-03 ENCOUNTER — Other Ambulatory Visit: Payer: Self-pay | Admitting: *Deleted

## 2016-10-03 MED ORDER — TIZANIDINE HCL 4 MG PO TABS: ORAL_TABLET | ORAL | 0 refills | Status: DC

## 2016-10-19 ENCOUNTER — Encounter: Payer: Self-pay | Admitting: Gastroenterology

## 2016-11-04 ENCOUNTER — Other Ambulatory Visit: Payer: Self-pay | Admitting: Internal Medicine

## 2016-12-04 ENCOUNTER — Ambulatory Visit (INDEPENDENT_AMBULATORY_CARE_PROVIDER_SITE_OTHER): Payer: Medicare Other | Admitting: Internal Medicine

## 2016-12-04 ENCOUNTER — Encounter: Payer: Self-pay | Admitting: Internal Medicine

## 2016-12-04 ENCOUNTER — Other Ambulatory Visit (INDEPENDENT_AMBULATORY_CARE_PROVIDER_SITE_OTHER): Payer: Medicare Other

## 2016-12-04 DIAGNOSIS — E119 Type 2 diabetes mellitus without complications: Secondary | ICD-10-CM | POA: Diagnosis not present

## 2016-12-04 DIAGNOSIS — I10 Essential (primary) hypertension: Secondary | ICD-10-CM | POA: Diagnosis not present

## 2016-12-04 DIAGNOSIS — M5442 Lumbago with sciatica, left side: Secondary | ICD-10-CM

## 2016-12-04 DIAGNOSIS — M5441 Lumbago with sciatica, right side: Secondary | ICD-10-CM | POA: Diagnosis not present

## 2016-12-04 DIAGNOSIS — E559 Vitamin D deficiency, unspecified: Secondary | ICD-10-CM | POA: Diagnosis not present

## 2016-12-04 DIAGNOSIS — E538 Deficiency of other specified B group vitamins: Secondary | ICD-10-CM | POA: Diagnosis not present

## 2016-12-04 DIAGNOSIS — G8929 Other chronic pain: Secondary | ICD-10-CM

## 2016-12-04 LAB — BASIC METABOLIC PANEL
BUN: 12 mg/dL (ref 6–23)
CO2: 27 mEq/L (ref 19–32)
Calcium: 9.4 mg/dL (ref 8.4–10.5)
Chloride: 109 mEq/L (ref 96–112)
Creatinine, Ser: 0.95 mg/dL (ref 0.40–1.50)
GFR: 102.4 mL/min (ref >60.00)
GLUCOSE: 78 mg/dL (ref 70–99)
POTASSIUM: 4.4 meq/L (ref 3.5–5.1)
SODIUM: 142 meq/L (ref 135–145)

## 2016-12-04 LAB — HEMOGLOBIN A1C: HEMOGLOBIN A1C: 5.5 % (ref 4.6–6.5)

## 2016-12-04 MED ORDER — METOPROLOL TARTRATE 25 MG PO TABS: ORAL_TABLET | ORAL | 3 refills | Status: DC

## 2016-12-04 MED ORDER — TIZANIDINE HCL 4 MG PO TABS: ORAL_TABLET | ORAL | 0 refills | Status: DC

## 2016-12-04 MED ORDER — MELOXICAM 15 MG PO TABS: ORAL_TABLET | ORAL | 0 refills | Status: DC

## 2016-12-04 MED ORDER — FUROSEMIDE 40 MG PO TABS: 40.0000 mg | ORAL_TABLET | Freq: Every day | ORAL | 0 refills | Status: DC | PRN

## 2016-12-04 MED ORDER — POTASSIUM CHLORIDE CRYS ER 20 MEQ PO TBCR: 20.0000 meq | EXTENDED_RELEASE_TABLET | Freq: Every day | ORAL | 3 refills | Status: DC

## 2016-12-04 MED ORDER — BENAZEPRIL HCL 40 MG PO TABS: 40.0000 mg | ORAL_TABLET | Freq: Every day | ORAL | 3 refills | Status: DC

## 2016-12-04 MED ORDER — METFORMIN HCL 500 MG PO TABS: ORAL_TABLET | ORAL | 3 refills | Status: DC

## 2016-12-04 NOTE — Assessment & Plan Note (Signed)
Labs On metformin

## 2016-12-04 NOTE — Assessment & Plan Note (Signed)
On Vit D 

## 2016-12-04 NOTE — Assessment & Plan Note (Signed)
Benazepril, Furosemide

## 2016-12-04 NOTE — Assessment & Plan Note (Signed)
Tramadol prn 

## 2016-12-04 NOTE — Assessment & Plan Note (Signed)
On B12 

## 2016-12-04 NOTE — Progress Notes (Signed)
Subjective:  Patient ID: Brian Mccullough, male    DOB: September 03, 1951  Age: 66 y.o. MRN: [id number]  CC: Follow-up (HTN)   HPI Brian Mccullough presents for HTN, DM, OA, LBP f/u  Outpatient Medications Prior to Visit  Medication Sig Dispense Refill  . aspirin 81 MG EC tablet Take 81 mg by mouth daily.      . benazepril (LOTENSIN) 40 MG tablet Take 1 tablet (40 mg total) by mouth daily. 90 tablet 3  . chlorhexidine (PERIDEX) 0.12 % solution   0  . Cholecalciferol (VITAMIN D3) 1000 UNITS tablet Take 1,000 Units by mouth daily.     . COCONUT OIL PO Take 1 tablet by mouth daily. 1000 mg    . cyanocobalamin 100 MCG tablet Take 1,000 mcg by mouth daily.     . furosemide (LASIX) 40 MG tablet Take 1 tablet (40 mg total) by mouth every other day. 30 tablet 5  . meloxicam (MOBIC) 15 MG tablet TAKE 1 TABLET BY MOUTH  EVERY DAY AS NEEDED FOR  PAIN 90 tablet 0  . metFORMIN (GLUCOPHAGE) 500 MG tablet Take 1 tablet by mouth  every day with breakfast 90 tablet 3  . metoprolol tartrate (LOPRESSOR) 25 MG tablet Take 1 to 2 tablets by  mouth two times daily as  needed for palpitations 360 tablet 3  . potassium chloride SA (K-DUR,KLOR-CON) 20 MEQ tablet Take 1 tablet (20 mEq total) by mouth daily. 90 tablet 3  . tiZANidine (ZANAFLEX) 4 MG tablet TAKE 1 TABLET(4 MG) BY MOUTH EVERY 8 HOURS AS NEEDED FOR MUSCLE SPASMS 270 tablet 0  . traMADol (ULTRAM) 50 MG tablet TAKE 1-2 TABLETS BY MOUTH TWICE DAILY AS NEEDED 100 tablet 2  . traZODone (DESYREL) 50 MG tablet TAKE 0.5-1 TABLETS BY MOUTH AT BEDTIME AS NEEDED FOR  SLEEP. 90 tablet 3  . triamcinolone ointment (KENALOG) 0.1 % Apply 1 application topically 2 (two) times daily as needed (rash). 80 g 0   Facility-Administered Medications Prior to Visit  Medication Dose Route Frequency Provider Last Rate Last Dose  . testosterone cypionate (DEPOTESTOTERONE CYPIONATE) injection 200 mg  200 mg Intramuscular Q14 Days Jentzen Minasyan V, MD        ROS Review of  Systems  Constitutional: Negative for appetite change, fatigue and unexpected weight change.  HENT: Negative for congestion, nosebleeds, sneezing, sore throat and trouble swallowing.   Eyes: Negative for itching and visual disturbance.  Respiratory: Negative for cough.   Cardiovascular: Negative for chest pain, palpitations and leg swelling.  Gastrointestinal: Negative for abdominal distention, blood in stool, diarrhea and nausea.  Genitourinary: Negative for frequency and hematuria.  Musculoskeletal: Positive for arthralgias and back pain. Negative for gait problem, joint swelling and neck pain.  Skin: Negative for rash.  Neurological: Negative for dizziness, tremors, speech difficulty and weakness.  Psychiatric/Behavioral: Negative for agitation, dysphoric mood and sleep disturbance. The patient is not nervous/anxious.     Objective:  BP 122/72   Pulse (!) 56   Temp 99 F (37.2 C) (Oral)   Resp 16   Ht 6\' 3"  (1.905 m)   Wt 238 lb 12 oz (108.3 kg)   SpO2 99%   BMI 29.84 kg/m   BP Readings from Last 3 Encounters:  12/04/16 122/72  08/01/16 100/60  03/27/16 90/64    Wt Readings from Last 3 Encounters:  12/04/16 238 lb 12 oz (108.3 kg)  08/01/16 236 lb (107 kg)  03/27/16 246 lb (111.6 kg)  Physical Exam  Constitutional: He is oriented to person, place, and time. He appears well-developed. No distress.  NAD  HENT:  Mouth/Throat: Oropharynx is clear and moist.  Eyes: Conjunctivae are normal. Pupils are equal, round, and reactive to light.  Neck: Normal range of motion. No JVD present. No thyromegaly present.  Cardiovascular: Normal rate, regular rhythm, normal heart sounds and intact distal pulses.  Exam reveals no gallop and no friction rub.   No murmur heard. Pulmonary/Chest: Effort normal and breath sounds normal. No respiratory distress. He has no wheezes. He has no rales. He exhibits no tenderness.  Abdominal: Soft. Bowel sounds are normal. He exhibits no distension  and no mass. There is no tenderness. There is no rebound and no guarding.  Musculoskeletal: Normal range of motion. He exhibits tenderness. He exhibits no edema.  Lymphadenopathy:    He has no cervical adenopathy.  Neurological: He is alert and oriented to person, place, and time. He has normal reflexes. No cranial nerve deficit. He exhibits normal muscle tone. He displays a negative Romberg sign. Coordination and gait normal.  Skin: Skin is warm and dry. No rash noted.  Psychiatric: He has a normal mood and affect. His behavior is normal. Judgment and thought content normal.    Lab Results  Component Value Date   WBC 4.9 03/27/2016   HGB 15.3 03/27/2016   HCT 44.8 03/27/2016   PLT 188.0 03/27/2016   GLUCOSE 90 08/01/2016   CHOL 175 03/27/2016   TRIG 109.0 03/27/2016   HDL 33.60 (L) 03/27/2016   LDLDIRECT 88.1 01/23/2011   LDLCALC 120 (H) 03/27/2016   ALT 6 03/27/2016   AST 10 03/27/2016   NA 141 08/01/2016   K 4.6 08/01/2016   CL 108 08/01/2016   CREATININE 0.94 08/01/2016   BUN 11 08/01/2016   CO2 29 08/01/2016   TSH 0.63 03/27/2016   PSA 0.25 03/27/2016   HGBA1C 5.3 08/01/2016   MICROALBUR 0.7 08/01/2016    No results found.  Assessment & Plan:   There are no diagnoses linked to this encounter. I am having Mr. Corazza maintain his aspirin, cyanocobalamin, cholecalciferol, furosemide, COCONUT OIL PO, chlorhexidine, metFORMIN, benazepril, potassium chloride SA, metoprolol tartrate, traZODone, triamcinolone ointment, traMADol, tiZANidine, and meloxicam. We will continue to administer testosterone cypionate.  No orders of the defined types were placed in this encounter.    Follow-up: No Follow-up on file.  Walker Kehr, MD

## 2016-12-04 NOTE — Progress Notes (Signed)
Pre-visit discussion using our clinic review tool. No additional management support is needed unless otherwise documented below in the visit note.  

## 2017-01-22 ENCOUNTER — Other Ambulatory Visit: Payer: Self-pay | Admitting: Internal Medicine

## 2017-02-12 ENCOUNTER — Encounter: Payer: Self-pay | Admitting: Gastroenterology

## 2017-03-01 ENCOUNTER — Other Ambulatory Visit: Payer: Self-pay

## 2017-03-01 MED ORDER — TRAMADOL HCL 50 MG PO TABS: ORAL_TABLET | ORAL | 1 refills | Status: DC

## 2017-04-04 ENCOUNTER — Encounter: Payer: Self-pay | Admitting: Gastroenterology

## 2017-04-04 ENCOUNTER — Ambulatory Visit (AMBULATORY_SURGERY_CENTER): Payer: Self-pay

## 2017-04-04 VITALS — Ht 76.0 in | Wt 230.0 lb

## 2017-04-04 DIAGNOSIS — Z86010 Personal history of colonic polyps: Secondary | ICD-10-CM

## 2017-04-04 MED ORDER — NA SULFATE-K SULFATE-MG SULF 17.5-3.13-1.6 GM/177ML PO SOLN: 1.0000 | Freq: Once | ORAL | 0 refills | Status: AC

## 2017-04-04 NOTE — Progress Notes (Signed)
Denies allergies to eggs or soy products. Denies complication of anesthesia or sedation. Denies use of weight loss medication. Denies use of O2.   Emmi instructions declined. Patient has had many colonoscopies.

## 2017-04-09 ENCOUNTER — Ambulatory Visit (INDEPENDENT_AMBULATORY_CARE_PROVIDER_SITE_OTHER): Payer: Medicare Other | Admitting: Internal Medicine

## 2017-04-09 ENCOUNTER — Other Ambulatory Visit (INDEPENDENT_AMBULATORY_CARE_PROVIDER_SITE_OTHER): Payer: Medicare Other

## 2017-04-09 ENCOUNTER — Telehealth: Payer: Self-pay | Admitting: Internal Medicine

## 2017-04-09 ENCOUNTER — Encounter: Payer: Self-pay | Admitting: Internal Medicine

## 2017-04-09 DIAGNOSIS — E119 Type 2 diabetes mellitus without complications: Secondary | ICD-10-CM

## 2017-04-09 DIAGNOSIS — R42 Dizziness and giddiness: Secondary | ICD-10-CM | POA: Diagnosis not present

## 2017-04-09 DIAGNOSIS — E559 Vitamin D deficiency, unspecified: Secondary | ICD-10-CM | POA: Diagnosis not present

## 2017-04-09 DIAGNOSIS — I10 Essential (primary) hypertension: Secondary | ICD-10-CM | POA: Diagnosis not present

## 2017-04-09 DIAGNOSIS — E538 Deficiency of other specified B group vitamins: Secondary | ICD-10-CM | POA: Diagnosis not present

## 2017-04-09 DIAGNOSIS — N500 Atrophy of testis: Secondary | ICD-10-CM

## 2017-04-09 LAB — BASIC METABOLIC PANEL
BUN: 12 mg/dL (ref 6–23)
CHLORIDE: 110 meq/L (ref 96–112)
CO2: 25 meq/L (ref 19–32)
Calcium: 9.3 mg/dL (ref 8.4–10.5)
Creatinine, Ser: 0.88 mg/dL (ref 0.40–1.50)
GFR: 111.73 mL/min (ref >60.00)
GLUCOSE: 97 mg/dL (ref 70–99)
POTASSIUM: 3.9 meq/L (ref 3.5–5.1)
SODIUM: 143 meq/L (ref 135–145)

## 2017-04-09 LAB — HEMOGLOBIN A1C: Hgb A1c MFr Bld: 5.2 % (ref 4.6–6.5)

## 2017-04-09 MED ORDER — BENAZEPRIL HCL 40 MG PO TABS: 20.0000 mg | ORAL_TABLET | Freq: Every day | ORAL | 3 refills | Status: DC

## 2017-04-09 NOTE — Telephone Encounter (Signed)
They needs an additional order put in [facility] 2[address] ultrasound venus flow

## 2017-04-09 NOTE — Assessment & Plan Note (Signed)
Labs Metformin 

## 2017-04-09 NOTE — Progress Notes (Signed)
Subjective:  Patient ID: Brian Mccullough, male    DOB: Feb 07, 1951  Age: 66 y.o. MRN: [id number]  CC: No chief complaint on file.   HPI GRAIG HESSLING presents for DM, OA, HTN f/u C/o occ lightheadedness - 2 seconds  Outpatient Medications Prior to Visit  Medication Sig Dispense Refill  . aspirin 81 MG EC tablet Take 81 mg by mouth daily.      . benazepril (LOTENSIN) 40 MG tablet Take 1 tablet (40 mg total) by mouth daily. 90 tablet 3  . Cholecalciferol (VITAMIN D3) 1000 UNITS tablet Take 1,000 Units by mouth daily.     . COCONUT OIL PO Take 1 tablet by mouth daily. 1000 mg    . cyanocobalamin 100 MCG tablet Take 1,000 mcg by mouth daily.     . meloxicam (MOBIC) 15 MG tablet TAKE 1 TABLET BY MOUTH  EVERY DAY AS NEEDED FOR  PAIN 90 tablet 0  . metFORMIN (GLUCOPHAGE) 500 MG tablet Take 1 tablet by mouth  every day with breakfast 90 tablet 3  . metoprolol tartrate (LOPRESSOR) 25 MG tablet Take 1 to 2 tablets by  mouth two times daily as  needed for palpitations 360 tablet 3  . potassium chloride SA (K-DUR,KLOR-CON) 20 MEQ tablet Take 1 tablet (20 mEq total) by mouth daily. 90 tablet 3  . tiZANidine (ZANAFLEX) 4 MG tablet TAKE 1 TABLET(4 MG) BY MOUTH EVERY 8 HOURS AS NEEDED FOR MUSCLE SPASMS 270 tablet 0  . traMADol (ULTRAM) 50 MG tablet TAKE 1-2 TABLETS BY MOUTH TWICE DAILY AS NEEDED 100 tablet 1  . traZODone (DESYREL) 50 MG tablet TAKE 0.5-1 TABLETS BY MOUTH AT BEDTIME AS NEEDED FOR  SLEEP. 90 tablet 3  . triamcinolone ointment (KENALOG) 0.1 % Apply 1 application topically 2 (two) times daily as needed (rash). 80 g 0  . furosemide (LASIX) 40 MG tablet TAKE 1 TABLET BY MOUTH  DAILY AS NEEDED 90 tablet 0   Facility-Administered Medications Prior to Visit  Medication Dose Route Frequency Provider Last Rate Last Dose  . testosterone cypionate (DEPOTESTOTERONE CYPIONATE) injection 200 mg  200 mg Intramuscular Q14 Days Cearra Portnoy, Evie Lacks, MD        ROS Review of Systems    Constitutional: Positive for fatigue. Negative for appetite change and unexpected weight change.  HENT: Negative for congestion, nosebleeds, sneezing, sore throat and trouble swallowing.   Eyes: Negative for itching and visual disturbance.  Respiratory: Negative for cough.   Cardiovascular: Negative for chest pain, palpitations and leg swelling.  Gastrointestinal: Negative for abdominal distention, blood in stool, diarrhea and nausea.  Genitourinary: Negative for frequency and hematuria.  Musculoskeletal: Negative for back pain, gait problem, joint swelling and neck pain.  Skin: Negative for rash.  Neurological: Positive for light-headedness. Negative for dizziness, tremors, speech difficulty and weakness.  Psychiatric/Behavioral: Negative for agitation, dysphoric mood and sleep disturbance. The patient is not nervous/anxious.   Scrotum: L testis - very small; R nl  Objective:  BP 118/70 (BP Location: Left Arm, Patient Position: Sitting, Cuff Size: Large)   Pulse (!) 58   Temp 99.1 F (37.3 C) (Oral)   Ht 6\' 4"  (1.93 m)   Wt 231 lb (104.8 kg)   SpO2 99%   BMI 28.12 kg/m   BP Readings from Last 3 Encounters:  04/09/17 118/70  12/04/16 122/72  08/01/16 100/60    Wt Readings from Last 3 Encounters:  04/09/17 231 lb (104.8 kg)  04/04/17 230 lb (104.3 kg)  12/04/16  238 lb 12 oz (108.3 kg)    Physical Exam  Constitutional: He is oriented to person, place, and time. He appears well-developed. No distress.  NAD  HENT:  Mouth/Throat: Oropharynx is clear and moist.  Eyes: Conjunctivae are normal. Pupils are equal, round, and reactive to light.  Neck: Normal range of motion. No JVD present. No thyromegaly present.  Cardiovascular: Normal rate, regular rhythm, normal heart sounds and intact distal pulses.  Exam reveals no gallop and no friction rub.   No murmur heard. Pulmonary/Chest: Effort normal and breath sounds normal. No respiratory distress. He has no wheezes. He has no  rales. He exhibits no tenderness.  Abdominal: Soft. Bowel sounds are normal. He exhibits no distension and no mass. There is no tenderness. There is no rebound and no guarding.  Musculoskeletal: Normal range of motion. He exhibits no edema or tenderness.  Lymphadenopathy:    He has no cervical adenopathy.  Neurological: He is alert and oriented to person, place, and time. He has normal reflexes. No cranial nerve deficit. He exhibits normal muscle tone. He displays a negative Romberg sign. Coordination and gait normal.  Skin: Skin is warm and dry. No rash noted.  Psychiatric: He has a normal mood and affect. His behavior is normal. Judgment and thought content normal.  L knee w/a scar  Lab Results  Component Value Date   WBC 4.9 03/27/2016   HGB 15.3 03/27/2016   HCT 44.8 03/27/2016   PLT 188.0 03/27/2016   GLUCOSE 78 12/04/2016   CHOL 175 03/27/2016   TRIG 109.0 03/27/2016   HDL 33.60 (L) 03/27/2016   LDLDIRECT 88.1 01/23/2011   LDLCALC 120 (H) 03/27/2016   ALT 6 03/27/2016   AST 10 03/27/2016   NA 142 12/04/2016   K 4.4 12/04/2016   CL 109 12/04/2016   CREATININE 0.95 12/04/2016   BUN 12 12/04/2016   CO2 27 12/04/2016   TSH 0.63 03/27/2016   PSA 0.25 03/27/2016   HGBA1C 5.5 12/04/2016   MICROALBUR 0.7 08/01/2016    No results found.  Assessment & Plan:   There are no diagnoses linked to this encounter. I have discontinued Mr. Canan's furosemide. I am also having him maintain his aspirin, cyanocobalamin, cholecalciferol, COCONUT OIL PO, traZODone, triamcinolone ointment, benazepril, metFORMIN, metoprolol tartrate, potassium chloride SA, tiZANidine, meloxicam, and traMADol. We will continue to administer testosterone cypionate.  No orders of the defined types were placed in this encounter.    Follow-up: No Follow-up on file.  Walker Kehr, MD

## 2017-04-09 NOTE — Assessment & Plan Note (Signed)
On Vit D 

## 2017-04-09 NOTE — Assessment & Plan Note (Signed)
Reduce Benazepril to 20 mg/d due to lightheadedness

## 2017-04-09 NOTE — Assessment & Plan Note (Signed)
On B12 

## 2017-04-09 NOTE — Assessment & Plan Note (Signed)
?  L testis Scrotum US

## 2017-04-09 NOTE — Assessment & Plan Note (Signed)
Reduce Benazepril to 20 mg/d due to lightheadedness Labs

## 2017-04-10 NOTE — Addendum Note (Signed)
Addended by: Karren Cobble on: 04/10/2017 01:00 PM   Modules accepted: Orders

## 2017-04-10 NOTE — Telephone Encounter (Signed)
order entered 

## 2017-04-13 ENCOUNTER — Ambulatory Visit
Admission: RE | Admit: 2017-04-13 | Discharge: 2017-04-13 | Disposition: A | Discharge status: Home / Self Care | Payer: Medicare Other | Source: Ambulatory Visit | Attending: Internal Medicine | Admitting: Internal Medicine

## 2017-04-13 DIAGNOSIS — N500 Atrophy of testis: Secondary | ICD-10-CM

## 2017-04-15 ENCOUNTER — Other Ambulatory Visit: Payer: Self-pay | Admitting: Internal Medicine

## 2017-04-15 DIAGNOSIS — N500 Atrophy of testis: Secondary | ICD-10-CM

## 2017-04-18 ENCOUNTER — Ambulatory Visit (AMBULATORY_SURGERY_CENTER): Payer: Medicare Other | Admitting: Gastroenterology

## 2017-04-18 ENCOUNTER — Encounter: Payer: Self-pay | Admitting: Gastroenterology

## 2017-04-18 VITALS — BP 156/83 | HR 48 | Temp 98.0°F | Resp 15 | Ht 76.0 in | Wt 231.0 lb

## 2017-04-18 DIAGNOSIS — D123 Benign neoplasm of transverse colon: Secondary | ICD-10-CM

## 2017-04-18 DIAGNOSIS — Z86010 Personal history of colonic polyps: Secondary | ICD-10-CM | POA: Diagnosis present

## 2017-04-18 MED ORDER — SODIUM CHLORIDE 0.9 % IV SOLN: 500.0000 mL | INTRAVENOUS | Status: DC

## 2017-04-18 MED ORDER — DEXTROSE 5 % IV SOLN: INTRAVENOUS | Status: DC

## 2017-04-18 NOTE — Patient Instructions (Signed)
NO ASPIRIN, ASPIRIN PRODUCTS OR NSAIDS(MOTRIN, ADVIL, IBUPROFEN OR ALEVE) FOR TWO WEEKS, May 02, 2017.  HANDOUTS GIVEN: POLYPS.     YOU HAD AN ENDOSCOPIC PROCEDURE TODAY AT [facility]:   Refer to the procedure report that was given to you for any specific questions about what was found during the examination.  If the procedure report does not answer your questions, please call your gastroenterologist to clarify.  If you requested that your care partner not be given the details of your procedure findings, then the procedure report has been included in a sealed envelope for you to review at your convenience later.  YOU SHOULD EXPECT: Some feelings of bloating in the abdomen. Passage of more gas than usual.  Walking can help get rid of the air that was put into your GI tract during the procedure and reduce the bloating. If you had a lower endoscopy (such as a colonoscopy or flexible sigmoidoscopy) you may notice spotting of blood in your stool or on the toilet paper. If you underwent a bowel prep for your procedure, you may not have a normal bowel movement for a few days.  Please Note:  You might notice some irritation and congestion in your nose or some drainage.  This is from the oxygen used during your procedure.  There is no need for concern and it should clear up in a day or so.  SYMPTOMS TO REPORT IMMEDIATELY:   Following lower endoscopy (colonoscopy or flexible sigmoidoscopy):  Excessive amounts of blood in the stool  Significant tenderness or worsening of abdominal pains  Swelling of the abdomen that is new, acute  Fever of 100F or higher   For urgent or emergent issues, a gastroenterologist can be reached at any hour by calling [phone].   DIET:  We do recommend a small meal at first, but then you may proceed to your regular diet.  Drink plenty of fluids but you should avoid alcoholic beverages for 24 hours.  ACTIVITY:  You should plan to take it easy for  the rest of today and you should NOT DRIVE or use heavy machinery until tomorrow (because of the sedation medicines used during the test).    FOLLOW UP: Our staff will call the number listed on your records the next business day following your procedure to check on you and address any questions or concerns that you may have regarding the information given to you following your procedure. If we do not reach you, we will leave a message.  However, if you are feeling well and you are not experiencing any problems, there is no need to return our call.  We will assume that you have returned to your regular daily activities without incident.  If any biopsies were taken you will be contacted by phone or by letter within the next 1-3 weeks.  Please call us at [phone] if you have not heard about the biopsies in 3 weeks.    SIGNATURES/CONFIDENTIALITY: You and/or your care partner have signed paperwork which will be entered into your electronic medical record.  These signatures attest to the fact that that the information above on your After Visit Summary has been reviewed and is understood.  Full responsibility of the confidentiality of this discharge information lies with you and/or your care-partner.

## 2017-04-18 NOTE — Progress Notes (Signed)
Report to PACU, RN, vss, BBS= Clear.  

## 2017-04-18 NOTE — Op Note (Signed)
[place] Patient Name: Brian Mccullough Procedure Date: 04/18/2017 1:27 PM MRN: [id number] Endoscopist: Ladene Artist , MD Age: 66 Referring MD:  Date of Birth: [birth year] Gender: Male Account #: [id number] Procedure:                Colonoscopy Indications:              Surveillance: Personal history of adenomatous                            polyps on last colonoscopy 5 years ago Medicines:                Monitored Anesthesia Care Procedure:                Pre-Anesthesia Assessment:                           - Prior to the procedure, a History and Physical                            was performed, and patient medications and                            allergies were reviewed. The patient's tolerance of                            previous anesthesia was also reviewed. The risks                            and benefits of the procedure and the sedation                            options and risks were discussed with the patient.                            All questions were answered, and informed consent                            was obtained. Prior Anticoagulants: The patient has                            taken no previous anticoagulant or antiplatelet                            agents. ASA Grade Assessment: II - A patient with                            mild systemic disease. After reviewing the risks                            and benefits, the patient was deemed in                            satisfactory condition to undergo the procedure.  After obtaining informed consent, the colonoscope                            was passed under direct vision. Throughout the                            procedure, the patient's blood pressure, pulse, and                            oxygen saturations were monitored continuously. The                            Colonoscope was introduced through the anus and                            advanced to the the cecum,  identified by                            appendiceal orifice and ileocecal valve. The                            ileocecal valve, appendiceal orifice, and rectum                            were photographed. The quality of the bowel                            preparation was [Prep Quality]. The colonoscopy was                            performed without difficulty. The patient tolerated                            the procedure well. The quality of the bowel                            preparation was good. Scope In: 1:46:41 PM Scope Out: 2:05:05 PM Scope Withdrawal Time: 0 hours 12 minutes 50 seconds  Total Procedure Duration: 0 hours 18 minutes 24 seconds  Findings:                 The perianal and digital rectal examinations were                            normal.                           A 12 mm polyp was found in the transverse colon.                            The polyp was sessile. The polyp was removed with a                            hot snare. Resection and retrieval were complete.  A 8 mm polyp was found in the transverse colon. The                            polyp was sessile. The polyp was removed with a                            cold snare. Resection and retrieval were complete.                           The exam was otherwise normal throughout the                            examined colon. Complications:            No immediate complications. Estimated blood loss:                            None. Estimated Blood Loss:     Estimated blood loss: none. Estimated blood loss:                            none. Impression:               - One 12 mm polyp in the transverse colon, removed                            with a hot snare. Resected and retrieved.                           - One 8 mm polyp in the transverse colon, removed                            with a cold snare. Resected and retrieved. Recommendation:           - Patient has a contact number  available for                            emergencies. The signs and symptoms of potential                            delayed complications were discussed with the                            patient. Return to normal activities tomorrow.                            Written discharge instructions were provided to the                            patient.                           - Resume previous diet.                           - Continue  present medications.                           - Await pathology results.                           - Repeat colonoscopy in 3 - 5 years for                            surveillance pending pathology review.                           - No aspirin, ibuprofen, naproxen, or other                            non-steroidal anti-inflammatory drugs for 2 weeks                            after polyp removal. Ladene Artist, MD 04/18/2017 2:09:28 PM This report has been signed electronically.

## 2017-04-18 NOTE — Progress Notes (Signed)
Called to room to assist during endoscopic procedure.  Patient ID and intended procedure confirmed with present staff. Received instructions for my participation in the procedure from the performing physician.  

## 2017-04-18 NOTE — Progress Notes (Signed)
Pt. states no medical or surgical changes since previsit. Blood sugar 66, D5W IV infusing at [facility]. Patient denies any low blood sugar symptoms at this time.

## 2017-04-19 ENCOUNTER — Telehealth: Payer: Self-pay | Admitting: *Deleted

## 2017-04-19 NOTE — Telephone Encounter (Signed)
  Follow up Call-  Call back number 04/18/2017  Post procedure Call Back phone  # [phone]  Permission to leave phone message Yes  Some recent data might be hidden     Patient questions:  Do you have a fever, pain , or abdominal swelling? No. Pain Score  0 *  Have you tolerated food without any problems? Yes.    Have you been able to return to your normal activities? Yes.    Do you have any questions about your discharge instructions: Diet   No. Medications  No. Follow up visit  No.  Do you have questions or concerns about your Care? No.  Actions: * If pain score is 4 or above: No action needed, pain <4.

## 2017-04-25 ENCOUNTER — Encounter: Payer: Self-pay | Admitting: Gastroenterology

## 2017-04-30 ENCOUNTER — Other Ambulatory Visit: Payer: Self-pay | Admitting: Internal Medicine

## 2017-05-14 ENCOUNTER — Other Ambulatory Visit: Payer: Self-pay | Admitting: Urology

## 2017-05-14 DIAGNOSIS — D4012 Neoplasm of uncertain behavior of left testis: Secondary | ICD-10-CM | POA: Diagnosis not present

## 2017-05-21 ENCOUNTER — Encounter (HOSPITAL_BASED_OUTPATIENT_CLINIC_OR_DEPARTMENT_OTHER): Payer: Self-pay | Admitting: *Deleted

## 2017-05-21 NOTE — Progress Notes (Signed)
To [facility] at 0845-Istat,Ekg on arrival-Npo after Mn.

## 2017-05-30 NOTE — H&P (Signed)
CC: I have swelling in my scrotum.  HPI: Brian Mccullough is a 66 year-old male patient who was referred by Dr. Evie Lacks. [name], MD who is here for scrotal swelling.  The mass is on the left side. He does have testicular pain on the side of his mass.   He has had the following surgeries: Hernia Repair. The patient denies having the following scrotal surgeries: Vasectomy, Hydrocele Repair, Undescended Testis Surgery, and Varicocele Repair.   Brian Mccullough is a 66 yo BM who is sent by Dr. Alain Marion for a left testicular mass. 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 He was having some pain and saw Dr. Alain Marion and a scrotal US was done which showed a hypoechoic mass in the superior pole of the left testicle. He can't feel a mass but something doesn't feel right. He is voiding without complaints but has seen Dr. Janice Norrie in the past for prostatitis. He has not had a vasectomy. He has a history of chronic back pain and has been on tramadol for years. He had atrophy of the testicles on the [place].      ALLERGIES: Codeine - Nausea    MEDICATIONS: Metformin Hcl 500 mg tablet  Aspirin Ec 81 mg tablet, delayed release  Benazepril Hcl 40 mg tablet 1/2 tablet PO Daily  Coconut Oil 1,000 mg capsule  Meloxicam 15 mg tablet tablet PRN  Metoprolol Tartrate 25 mg tablet tablet PRN  Potassium Chloride 20 meq tablet, extended release  Tizanidine Hcl 4 mg tablet tablet PRN  Tramadol Hcl 50 mg tablet  Trazodone Hcl 50 mg tablet tablet PRN  Triamcinolone Acetonide 0.1 % ointment  Vitamin B12  Vitamin D3 1,000 unit tablet     GU PSH: None   NON-GU PSH: Hernia Repair Total Knee Replacement, Left    GU PMH: Renal calculus      PMH Notes:  1898-10-09 00:00:00 - Note: Normal Routine History And Physical Adult   NON-GU PMH: Arthritis Diabetes Type 2 Hypertension    FAMILY HISTORY: Death of family member - Father liver disease - Father    Notes: 3 sons   SOCIAL HISTORY:  Marital Status: Married Preferred Language: English; Ethnicity: Not Hispanic Or Latino; Race: Black or African American Current Smoking Status: Patient smokes. Has smoked since 05/10/1987. Smokes less than 1/2 pack per day.   Tobacco Use Assessment Completed: Used Tobacco in last 30 days? Does not use smokeless tobacco. Has never drank.  Drinks 2 caffeinated drinks per day. Patient's occupation is/was retired.    REVIEW OF SYSTEMS:    GU Review Male:   Patient denies frequent urination, hard to postpone urination, burning/ pain with urination, get up at night to urinate, leakage of urine, stream starts and stops, trouble starting your stream, have to strain to urinate , erection problems, and penile pain.  Gastrointestinal (Upper):   Patient denies indigestion/ heartburn, nausea, and vomiting.  Gastrointestinal (Lower):   Patient reports diarrhea and constipation.   Constitutional:   Patient denies fever, night sweats, weight loss, and fatigue.  Skin:   Patient denies skin rash/ lesion and itching.  Eyes:   Patient denies blurred vision and double vision.  Ears/ Nose/ Throat:   Patient reports sinus problems. Patient denies sore throat.  Hematologic/Lymphatic:   Patient denies swollen glands and easy bruising.  Cardiovascular:   Patient denies leg swelling and chest pains.  Respiratory:   Patient reports cough. Patient denies shortness of breath.  Endocrine:  Patient denies excessive thirst.  Musculoskeletal:   Patient reports back pain and joint pain.   Neurological:   Patient denies headaches and dizziness.  Psychologic:   Patient denies depression and anxiety.   VITAL SIGNS:      05/14/2017 08:18 AM  Weight 230 lb / 104.33 kg  Height 75.5 in / 191.77 cm  BP 131/77 mmHg  Pulse 60 /min  Temperature 99.8 F / 37.6 C  BMI 28.4 kg/m   GU PHYSICAL EXAMINATION:    Scrotum: No lesions. No edema. No cysts. No warts.  Epididymides: Right: no spermatocele, no masses, no cysts, no  tenderness, no induration, no enlargement. Left: no spermatocele, no masses, no cysts, no tenderness, no induration, no enlargement.  Testes: Atrophic left testis. Solid mass left testis 1.5cm. No tenderness, no swelling, no enlargement left testis. No tenderness, no swelling, no enlargement right testis. Normal location left testis. Normal location right testis. No cyst, no varicocele, no hydrocele left testis. No mass, no cyst, no varicocele, no hydrocele right testis.   Urethral Meatus: Normal size. No lesion, no wart, no discharge, no polyp. Normal location.  Penis: Circumcised, no warts, no cracks. No dorsal Peyronie's plaques, no left corporal Peyronie's plaques, no right corporal Peyronie's plaques, no scarring, no warts. No balanitis, no meatal stenosis.   MULTI-SYSTEM PHYSICAL EXAMINATION:    Constitutional: Well-nourished. No physical deformities. Normally developed. Good grooming.  Neck: Neck symmetrical, not swollen. Normal tracheal position.  Respiratory: No labored breathing, no use of accessory muscles. CTA  Cardiovascular: Normal temperature, RRR without murmur  Lymphatic: No enlargement of neck, axillae, groin.  Skin: No paleness, no jaundice, no cyanosis. No lesion, no ulcer, no rash.  Neurologic / Psychiatric: Oriented to time, oriented to place, oriented to person. No depression, no anxiety, no agitation.  Gastrointestinal: No hernia. No mass, no tenderness, no rigidity, non obese abdomen.   Ears, Nose, Mouth, and Throat: Left ear no scars, no lesions, no masses. Right ear no scars, no lesions, no masses. Nose no scars, no lesions, no masses. Normal hearing. Normal lips.  Musculoskeletal: Normal gait and station of head and neck.     PAST DATA REVIEWED:  Source Of History:  Patient  Records Review:   Previous Doctor Records  Urine Test Review:   Urinalysis  X-Ray Review: Scrotal Ultrasound: Reviewed Films. Reviewed Report. Discussed With Patient.     10/27/05  PSA  Total  PSA 0.21     08/14/06 02/08/06 10/27/05  Hormones  Testosterone, Total 1.99  1.20  1.14    Notes:                     Records and labs from Dr. Alain Marion reviewed. His PSA is very low at 0.17.    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 I reviewed the risks of bleeding, infection, wound complications, nerve injury, thrombotic events and anesthetic complications. He is not interested in a prosthesis. I will get markers on him as well.  PLAN:           Orders Labs LDH, Alpha Fetoprotein (AFP), Beta HCG, Alpha Fetoprotein (AFP)          Schedule Return Visit/Planned Activity: 2 Weeks - Schedule Surgery    CC: I have swelling in my scrotum.  HPI: Brian Mccullough is a 66 year-old male patient who was referred by Dr. Evie Lacks. [name], MD who is here for scrotal swelling.  The mass is on the left side. He does have testicular pain on the side of his mass.   He has had the following surgeries: Hernia Repair. The patient denies having the following scrotal surgeries: Vasectomy, Hydrocele Repair, Undescended Testis Surgery, and Varicocele Repair.   Mr. Kinney is a 66 yo BM who is sent by Dr. Alain Marion for a left testicular mass. 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 He was having some pain and saw Dr. Alain Marion and a scrotal US was done which showed a hypoechoic mass in the superior pole of the left testicle. He can't feel a mass but something doesn't feel right. He is voiding without complaints but has seen Dr. Janice Norrie in the past for  prostatitis. He has not had a vasectomy. He has a history of chronic back pain and has been on tramadol for years. He had atrophy of the testicles on the [place].      ALLERGIES: Codeine - Nausea    MEDICATIONS: Metformin Hcl 500 mg tablet  Aspirin Ec 81 mg tablet, delayed release  Benazepril Hcl 40 mg tablet 1/2 tablet PO Daily  Coconut Oil 1,000 mg capsule  Meloxicam 15 mg tablet tablet PRN  Metoprolol Tartrate 25 mg tablet tablet PRN  Potassium Chloride 20 meq tablet, extended release  Tizanidine Hcl 4 mg tablet tablet PRN  Tramadol Hcl 50 mg tablet  Trazodone Hcl 50 mg tablet tablet PRN  Triamcinolone Acetonide 0.1 % ointment  Vitamin B12  Vitamin D3 1,000 unit tablet     GU PSH: None   NON-GU PSH: Hernia Repair Total Knee Replacement, Left    GU PMH: Renal calculus      PMH Notes:  1898-10-09 00:00:00 - Note: Normal Routine History And Physical Adult   NON-GU PMH: Arthritis Diabetes Type 2 Hypertension    FAMILY HISTORY: Death of family member - Father liver disease - Father    Notes: 3 sons   SOCIAL HISTORY: Marital Status: Married Preferred Language: English; Ethnicity: Not Hispanic Or Latino; Race: Black or African American Current Smoking Status: Patient smokes. Has smoked since 05/10/1987. Smokes less than 1/2 pack per day.   Tobacco Use Assessment Completed: Used Tobacco in last 30 days? Does not use smokeless tobacco. Has never drank.  Drinks 2 caffeinated drinks per day. Patient's occupation is/was retired.    REVIEW OF SYSTEMS:    GU Review Male:   Patient denies frequent urination, hard to postpone urination, burning/ pain with urination, get up at night to urinate, leakage of urine, stream starts and stops, trouble starting your stream, have to strain to urinate , erection problems, and penile pain.  Gastrointestinal (Upper):   Patient denies indigestion/ heartburn, nausea, and vomiting.  Gastrointestinal (Lower):   Patient reports diarrhea and  constipation.   Constitutional:   Patient denies fever, night sweats, weight loss, and fatigue.  Skin:   Patient denies skin rash/ lesion and itching.  Eyes:   Patient denies blurred vision and double vision.  Ears/ Nose/ Throat:   Patient  reports sinus problems. Patient denies sore throat.  Hematologic/Lymphatic:   Patient denies swollen glands and easy bruising.  Cardiovascular:   Patient denies leg swelling and chest pains.  Respiratory:   Patient reports cough. Patient denies shortness of breath.  Endocrine:   Patient denies excessive thirst.  Musculoskeletal:   Patient reports back pain and joint pain.   Neurological:   Patient denies headaches and dizziness.  Psychologic:   Patient denies depression and anxiety.   VITAL SIGNS:      05/14/2017 08:18 AM  Weight 230 lb / 104.33 kg  Height 75.5 in / 191.77 cm  BP 131/77 mmHg  Pulse 60 /min  Temperature 99.8 F / 37.6 C  BMI 28.4 kg/m   GU PHYSICAL EXAMINATION:    Scrotum: No lesions. No edema. No cysts. No warts.  Epididymides: Right: no spermatocele, no masses, no cysts, no tenderness, no induration, no enlargement. Left: no spermatocele, no masses, no cysts, no tenderness, no induration, no enlargement.  Testes: Atrophic left testis. Solid mass left testis 1.5cm. No tenderness, no swelling, no enlargement left testis. No tenderness, no swelling, no enlargement right testis. Normal location left testis. Normal location right testis. No cyst, no varicocele, no hydrocele left testis. No mass, no cyst, no varicocele, no hydrocele right testis.   Urethral Meatus: Normal size. No lesion, no wart, no discharge, no polyp. Normal location.  Penis: Circumcised, no warts, no cracks. No dorsal Peyronie's plaques, no left corporal Peyronie's plaques, no right corporal Peyronie's plaques, no scarring, no warts. No balanitis, no meatal stenosis.   MULTI-SYSTEM PHYSICAL EXAMINATION:    Constitutional: Well-nourished. No physical deformities.  Normally developed. Good grooming.  Neck: Neck symmetrical, not swollen. Normal tracheal position.  Respiratory: No labored breathing, no use of accessory muscles. CTA  Cardiovascular: Normal temperature, RRR without murmur  Lymphatic: No enlargement of neck, axillae, groin.  Skin: No paleness, no jaundice, no cyanosis. No lesion, no ulcer, no rash.  Neurologic / Psychiatric: Oriented to time, oriented to place, oriented to person. No depression, no anxiety, no agitation.  Gastrointestinal: No hernia. No mass, no tenderness, no rigidity, non obese abdomen.   Ears, Nose, Mouth, and Throat: Left ear no scars, no lesions, no masses. Right ear no scars, no lesions, no masses. Nose no scars, no lesions, no masses. Normal hearing. Normal lips.  Musculoskeletal: Normal gait and station of head and neck.     PAST DATA REVIEWED:  Source Of History:  Patient  Records Review:   Previous Doctor Records  Urine Test Review:   Urinalysis  X-Ray Review: Scrotal Ultrasound: Reviewed Films. Reviewed Report. Discussed With Patient.     10/27/05  PSA  Total PSA 0.21     08/14/06 02/08/06 10/27/05  Hormones  Testosterone, Total 1.99  1.20  1.14    Notes:                     Records and labs from Dr. Alain Marion reviewed. His PSA is very low at 0.17.    PROCEDURES:          Urinalysis - 81003 Dipstick Dipstick Cont'd  Specimen: Voided Bilirubin: Neg  Color: Yellow Ketones: Trace  Appearance: Clear Blood: Neg  Specific Gravity: 1.025 Protein: Neg  pH: 5.0 Urobilinogen: 1.0  Glucose: Neg Nitrites: Neg    Leukocyte Esterase: Neg    ASSESSMENT:      ICD-10 Details  1 GU:   Testis, Left, Neoplasm of uncertain behavior - Q03.47 He has a left  testicular mass in an atrophied testicle and needs a left radical orchiectomy. I reviewed the risks of bleeding, infection, wound complications, nerve injury, thrombotic events and anesthetic complications. He is not interested in a prosthesis. I will get markers on  him as well.   PLAN:           Orders Labs LDH, Alpha Fetoprotein (AFP), Beta HCG, Alpha Fetoprotein (AFP)          Schedule Return Visit/Planned Activity: 2 Weeks - Schedule Surgery

## 2017-05-31 ENCOUNTER — Other Ambulatory Visit: Payer: Self-pay

## 2017-05-31 ENCOUNTER — Ambulatory Visit (HOSPITAL_BASED_OUTPATIENT_CLINIC_OR_DEPARTMENT_OTHER): Payer: Medicare Other | Admitting: Anesthesiology

## 2017-05-31 ENCOUNTER — Ambulatory Visit (HOSPITAL_BASED_OUTPATIENT_CLINIC_OR_DEPARTMENT_OTHER)
Admission: RE | Admit: 2017-05-31 | Discharge: 2017-05-31 | Disposition: A | Discharge status: Home / Self Care | Payer: Medicare Other | Source: Ambulatory Visit | Attending: Urology | Admitting: Urology

## 2017-05-31 ENCOUNTER — Encounter (HOSPITAL_BASED_OUTPATIENT_CLINIC_OR_DEPARTMENT_OTHER)
Admission: RE | Disposition: A | Discharge status: Home / Self Care | Payer: Self-pay | Source: Ambulatory Visit | Attending: Urology

## 2017-05-31 ENCOUNTER — Encounter (HOSPITAL_BASED_OUTPATIENT_CLINIC_OR_DEPARTMENT_OTHER): Payer: Self-pay | Admitting: *Deleted

## 2017-05-31 DIAGNOSIS — E119 Type 2 diabetes mellitus without complications: Secondary | ICD-10-CM | POA: Insufficient documentation

## 2017-05-31 DIAGNOSIS — D4012 Neoplasm of uncertain behavior of left testis: Secondary | ICD-10-CM | POA: Diagnosis not present

## 2017-05-31 DIAGNOSIS — I10 Essential (primary) hypertension: Secondary | ICD-10-CM | POA: Diagnosis not present

## 2017-05-31 DIAGNOSIS — Z79899 Other long term (current) drug therapy: Secondary | ICD-10-CM | POA: Diagnosis not present

## 2017-05-31 DIAGNOSIS — Z7984 Long term (current) use of oral hypoglycemic drugs: Secondary | ICD-10-CM | POA: Diagnosis not present

## 2017-05-31 DIAGNOSIS — Z7982 Long term (current) use of aspirin: Secondary | ICD-10-CM | POA: Insufficient documentation

## 2017-05-31 DIAGNOSIS — N452 Orchitis: Secondary | ICD-10-CM | POA: Diagnosis not present

## 2017-05-31 DIAGNOSIS — F17210 Nicotine dependence, cigarettes, uncomplicated: Secondary | ICD-10-CM | POA: Diagnosis not present

## 2017-05-31 HISTORY — PX: ORCHIECTOMY: SHX2116

## 2017-05-31 LAB — POCT I-STAT 4, (NA,K, GLUC, HGB,HCT)
Glucose, Bld: 81 mg/dL (ref 65–99)
HEMATOCRIT: 44 % (ref 39.0–52.0)
HEMOGLOBIN: 15 g/dL (ref 13.0–17.0)
Potassium: 3.7 mmol/L (ref 3.5–5.1)
Sodium: 144 mmol/L (ref 135–145)

## 2017-05-31 LAB — GLUCOSE, CAPILLARY: GLUCOSE-CAPILLARY: 81 mg/dL (ref 65–99)

## 2017-05-31 SURGERY — ORCHIECTOMY
Anesthesia: General | Laterality: Left

## 2017-05-31 MED ORDER — ACETAMINOPHEN 325 MG PO TABS
650.0000 mg | ORAL_TABLET | ORAL | Status: DC | PRN
  Filled 2017-05-31: qty 2

## 2017-05-31 MED ORDER — FENTANYL CITRATE (PF) 100 MCG/2ML IJ SOLN
INTRAMUSCULAR | Status: AC
  Filled 2017-05-31: qty 2

## 2017-05-31 MED ORDER — TRAMADOL HCL 50 MG PO TABS
ORAL_TABLET | ORAL | Status: AC
  Filled 2017-05-31: qty 1

## 2017-05-31 MED ORDER — DEXAMETHASONE SODIUM PHOSPHATE 4 MG/ML IJ SOLN
INTRAMUSCULAR | Status: DC | PRN
  Administered 2017-05-31: 10 mg via INTRAVENOUS

## 2017-05-31 MED ORDER — TRAMADOL HCL 50 MG PO TABS
50.0000 mg | ORAL_TABLET | Freq: Two times a day (BID) | ORAL | Status: DC | PRN
Start: 2017-05-31 — End: 2017-05-31
  Administered 2017-05-31: 50 mg via ORAL
  Filled 2017-05-31: qty 1

## 2017-05-31 MED ORDER — CEFAZOLIN SODIUM-DEXTROSE 2-4 GM/100ML-% IV SOLN
INTRAVENOUS | Status: AC
  Filled 2017-05-31: qty 100

## 2017-05-31 MED ORDER — ONDANSETRON HCL 4 MG/2ML IJ SOLN
INTRAMUSCULAR | Status: AC
  Filled 2017-05-31: qty 2

## 2017-05-31 MED ORDER — CEFAZOLIN SODIUM-DEXTROSE 2-4 GM/100ML-% IV SOLN
2.0000 g | INTRAVENOUS | Status: AC
  Administered 2017-05-31: 2 g via INTRAVENOUS
  Filled 2017-05-31: qty 100

## 2017-05-31 MED ORDER — LIDOCAINE 2% (20 MG/ML) 5 ML SYRINGE
INTRAMUSCULAR | Status: DC | PRN
  Administered 2017-05-31: 100 mg via INTRAVENOUS

## 2017-05-31 MED ORDER — MORPHINE SULFATE (PF) 2 MG/ML IV SOLN
2.0000 mg | INTRAVENOUS | Status: DC | PRN
  Filled 2017-05-31: qty 1

## 2017-05-31 MED ORDER — PROMETHAZINE HCL 25 MG/ML IJ SOLN
6.2500 mg | INTRAMUSCULAR | Status: DC | PRN
  Filled 2017-05-31: qty 1

## 2017-05-31 MED ORDER — LACTATED RINGERS IV SOLN
INTRAVENOUS | Status: DC
  Administered 2017-05-31 (×2): via INTRAVENOUS
  Filled 2017-05-31: qty 1000

## 2017-05-31 MED ORDER — DEXAMETHASONE SODIUM PHOSPHATE 10 MG/ML IJ SOLN
INTRAMUSCULAR | Status: AC
  Filled 2017-05-31: qty 1

## 2017-05-31 MED ORDER — SODIUM CHLORIDE 0.9% FLUSH
3.0000 mL | Freq: Two times a day (BID) | INTRAVENOUS | Status: DC
  Filled 2017-05-31: qty 3

## 2017-05-31 MED ORDER — PROPOFOL 10 MG/ML IV BOLUS
INTRAVENOUS | Status: AC
  Filled 2017-05-31: qty 40

## 2017-05-31 MED ORDER — ACETAMINOPHEN 650 MG RE SUPP
650.0000 mg | RECTAL | Status: DC | PRN
  Filled 2017-05-31: qty 1

## 2017-05-31 MED ORDER — PROPOFOL 10 MG/ML IV BOLUS
INTRAVENOUS | Status: DC | PRN
  Administered 2017-05-31: 300 mg via INTRAVENOUS
  Administered 2017-05-31: 50 mg via INTRAVENOUS

## 2017-05-31 MED ORDER — LIDOCAINE 2% (20 MG/ML) 5 ML SYRINGE
INTRAMUSCULAR | Status: AC
  Filled 2017-05-31: qty 5

## 2017-05-31 MED ORDER — HYDROMORPHONE HCL 1 MG/ML IJ SOLN
0.2500 mg | INTRAMUSCULAR | Status: DC | PRN
  Filled 2017-05-31: qty 0.5

## 2017-05-31 MED ORDER — BUPIVACAINE HCL (PF) 0.25 % IJ SOLN
INTRAMUSCULAR | Status: DC | PRN
  Administered 2017-05-31: 18 mL

## 2017-05-31 MED ORDER — MIDAZOLAM HCL 5 MG/5ML IJ SOLN
INTRAMUSCULAR | Status: DC | PRN
  Administered 2017-05-31: 2 mg via INTRAVENOUS

## 2017-05-31 MED ORDER — ONDANSETRON HCL 4 MG/2ML IJ SOLN
INTRAMUSCULAR | Status: DC | PRN
  Administered 2017-05-31: 4 mg via INTRAVENOUS

## 2017-05-31 MED ORDER — SODIUM CHLORIDE 0.9% FLUSH
3.0000 mL | INTRAVENOUS | Status: DC | PRN
  Filled 2017-05-31: qty 3

## 2017-05-31 MED ORDER — FENTANYL CITRATE (PF) 100 MCG/2ML IJ SOLN
INTRAMUSCULAR | Status: DC | PRN
Start: 2017-05-31 — End: 2017-05-31
  Administered 2017-05-31 (×2): 50 ug via INTRAVENOUS

## 2017-05-31 MED ORDER — SODIUM CHLORIDE 0.9 % IV SOLN
250.0000 mL | INTRAVENOUS | Status: DC | PRN
  Filled 2017-05-31: qty 250

## 2017-05-31 MED ORDER — MIDAZOLAM HCL 2 MG/2ML IJ SOLN
INTRAMUSCULAR | Status: AC
  Filled 2017-05-31: qty 2

## 2017-05-31 SURGICAL SUPPLY — 50 items
ADH SKN CLS APL DERMABOND .7 (GAUZE/BANDAGES/DRESSINGS) ×2
APPLICATOR COTTON TIP 6IN STRL (MISCELLANEOUS) IMPLANT
BLADE CLIPPER SURG (BLADE) ×3 IMPLANT
BLADE SURG 15 STRL LF DISP TIS (BLADE) ×1 IMPLANT
BLADE SURG 15 STRL SS (BLADE) ×3
BNDG GAUZE ELAST 4 BULKY (GAUZE/BANDAGES/DRESSINGS) ×3 IMPLANT
CANISTER SUCTION 1200CC (MISCELLANEOUS) ×3 IMPLANT
CLEANER CAUTERY TIP 5X5 PAD (MISCELLANEOUS) ×1 IMPLANT
COVER BACK TABLE 60X90IN (DRAPES) ×3 IMPLANT
COVER MAYO STAND STRL (DRAPES) ×3 IMPLANT
DERMABOND ADVANCED (GAUZE/BANDAGES/DRESSINGS) ×4
DERMABOND ADVANCED .7 DNX12 (GAUZE/BANDAGES/DRESSINGS) ×1 IMPLANT
DISSECTOR ROUND CHERRY 3/8 STR (MISCELLANEOUS) IMPLANT
DRAIN PENROSE 18X1/4 LTX STRL (WOUND CARE) ×2 IMPLANT
DRAPE LAPAROTOMY TRNSV 102X78 (DRAPE) ×3 IMPLANT
DRSG TEGADERM 4X4.75 (GAUZE/BANDAGES/DRESSINGS) IMPLANT
ELECT NDL TIP 2.8 STRL (NEEDLE) IMPLANT
ELECT NEEDLE TIP 2.8 STRL (NEEDLE) ×3 IMPLANT
ELECT REM PT RETURN 9FT ADLT (ELECTROSURGICAL) ×3
ELECTRODE REM PT RTRN 9FT ADLT (ELECTROSURGICAL) ×1 IMPLANT
GLOVE SURG SS PI 8.0 STRL IVOR (GLOVE) ×3 IMPLANT
GOWN STRL REUS W/TWL LRG LVL3 (GOWN DISPOSABLE) IMPLANT
GOWN STRL REUS W/TWL XL LVL3 (GOWN DISPOSABLE) ×3 IMPLANT
KIT RM TURNOVER CYSTO AR (KITS) ×3 IMPLANT
NEEDLE HYPO 22GX1.5 SAFETY (NEEDLE) ×3 IMPLANT
NS IRRIG 500ML POUR BTL (IV SOLUTION) ×2 IMPLANT
PACK BASIN DAY SURGERY FS (CUSTOM PROCEDURE TRAY) ×3 IMPLANT
PAD CLEANER CAUTERY TIP 5X5 (MISCELLANEOUS) ×2
PENCIL BUTTON HOLSTER BLD 10FT (ELECTRODE) ×3 IMPLANT
SET COLLECT BLD 21X3/4 12 (NEEDLE) IMPLANT
SPONGE GAUZE 4X4 12PLY (GAUZE/BANDAGES/DRESSINGS) IMPLANT
SUPPORT SCROTAL LG STRP (MISCELLANEOUS) ×1 IMPLANT
SUPPORTER ATHLETIC LG (MISCELLANEOUS) ×1
SUT CHROMIC 3 0 SH 27 (SUTURE) ×3 IMPLANT
SUT CHROMIC GUT AB #0 18 (SUTURE) IMPLANT
SUT MNCRL AB 4-0 PS2 18 (SUTURE) ×2 IMPLANT
SUT SILK 3 0 TIES 17X18 (SUTURE)
SUT SILK 3-0 18XBRD TIE BLK (SUTURE) IMPLANT
SUT VICRYL 0 TIES 12 18 (SUTURE) ×2 IMPLANT
SUT VICRYL 2 0 18  UND BR (SUTURE)
SUT VICRYL 2 0 18 UND BR (SUTURE) IMPLANT
SYR 20CC LL (SYRINGE) ×3 IMPLANT
SYR BULB IRRIGATION 50ML (SYRINGE) ×3 IMPLANT
SYR CONTROL 10ML LL (SYRINGE) ×3 IMPLANT
TOWEL OR 17X24 6PK STRL BLUE (TOWEL DISPOSABLE) ×6 IMPLANT
TRAY DSU PREP LF (CUSTOM PROCEDURE TRAY) ×3 IMPLANT
TUBE CONNECTING 12'X1/4 (SUCTIONS) ×1
TUBE CONNECTING 12X1/4 (SUCTIONS) ×2 IMPLANT
WATER STERILE IRR 500ML POUR (IV SOLUTION) IMPLANT
YANKAUER SUCT BULB TIP NO VENT (SUCTIONS) ×3 IMPLANT

## 2017-05-31 NOTE — Anesthesia Preprocedure Evaluation (Addendum)
Anesthesia Evaluation  Patient identified by MRN, date of birth, ID band Patient awake    Reviewed: Allergy & Precautions, H&P , NPO status , Patient's Chart, lab work & pertinent test results  Airway Mallampati: I  TM Distance: >3 FB Neck ROM: Full    Dental  (+) Upper Dentures, Lower Dentures   Pulmonary Current Smoker,    breath sounds clear to auscultation       Cardiovascular hypertension, Pt. on medications  Rhythm:Regular Rate:Normal  ECG: SB, rate 57  Notes Recorded by Liliane Shi, PA-C on 08/22/2013 at 11:53 AM Please inform patient stress test normal. Richardson Dopp, PA-C 11:53 AM 08/22/2013   Neuro/Psych negative psych ROS   GI/Hepatic negative GI ROS, Neg liver ROS,   Endo/Other  diabetes, Well Controlled, Type 2, Oral Hypoglycemic Agents  Renal/GU negative Renal ROS     Musculoskeletal  (+) Arthritis ,   Abdominal   Peds  Hematology   Anesthesia Other Findings   Reproductive/Obstetrics                            Anesthesia Physical  Anesthesia Plan  ASA: III  Anesthesia Plan: General   Post-op Pain Management:    Induction: Intravenous  PONV Risk Score and Plan: 1 and Ondansetron, Dexamethasone and Treatment may vary due to age or medical condition  Airway Management Planned: LMA  Additional Equipment:   Intra-op Plan:   Post-operative Plan: Extubation in OR  Informed Consent: I have reviewed the patients History and Physical, chart, labs and discussed the procedure including the risks, benefits and alternatives for the proposed anesthesia with the patient or authorized representative who has indicated his/her understanding and acceptance.     Plan Discussed with: CRNA  Anesthesia Plan Comments:        Anesthesia Quick Evaluation

## 2017-05-31 NOTE — Discharge Instructions (Addendum)
Post Anesthesia Home Care Instructions  Activity: Get plenty of rest for the remainder of the day. A responsible individual must stay with you for 24 hours following the procedure.  For the next 24 hours, DO NOT: -Drive a car -Paediatric nurse -Drink alcoholic beverages -Take any medication unless instructed by your physician -Make any legal decisions or sign important papers.  Meals: Start with liquid foods such as gelatin or soup. Progress to regular foods as tolerated. Avoid greasy, spicy, heavy foods. If nausea and/or vomiting occur, drink only clear liquids until the nausea and/or vomiting subsides. Call your physician if vomiting continues.  Special Instructions/Symptoms: Your throat may feel dry or sore from the anesthesia or the breathing tube placed in your throat during surgery. If this causes discomfort, gargle with warm salt water. The discomfort should disappear within 24 hours.  If you had a scopolamine patch placed behind your ear for the management of post- operative nausea and/or vomiting:  1. The medication in the patch is effective for 72 hours, after which it should be removed.  Wrap patch in a tissue and discard in the trash. Wash hands thoroughly with soap and water. 2. You may remove the patch earlier than 72 hours if you experience unpleasant side effects which may include dry mouth, dizziness or visual disturbances. 3. Avoid touching the patch. Wash your hands with soap and water after contact with the patch.   Orchiectomy, Care After This sheet gives you information about how to care for yourself after your procedure. Your health care provider may also give you more specific instructions. If you have problems or questions, contact your health care provider. What can I expect after the procedure? After the procedure, it is common to have:  Pain.  Bruising.  Blood pooling (hematoma) in the area where your testicles were removed.  Depression or mood  changes.  Fatigue.  Hot flashes.  Follow these instructions at home: Managing pain and swelling  If directed, put ice on the affected area: ? Put ice in a plastic bag. ? Place a towel between your skin and the bag. ? Leave the ice on for 20 minutes, 2-3 times a day.  Wear scrotal support as told by your health care provider.  To relieve pressure and pain when sitting, you may use a donut cushion if directed by your health care provider. Incision care  Follow instructions from your health care provider about how to take care of your incision. Make sure you: ? Wash your hands with soap and water before you change your bandage (dressing). If soap and water are not available, use hand sanitizer. ? Change your dressing once a day, or as often as told by your health care provider. If the dressing sticks to your incision area: ? Use warm, soapy water or hydrogen peroxide to dampen the dressing. ? When the dressing becomes loose, lift it from the incision area. Make sure that the incision stays closed. ? Leave stitches (sutures), skin glue, or adhesive strips in place. These skin closures may need to stay in place for 2 weeks or longer. If adhesive strip edges start to loosen and curl up, you may trim the loose edges. Do not remove adhesive strips completely unless your health care provider tells you to do that.  Keep your dressing dry until it has been removed.  Check your incision area every day for signs of infection. Check for: ? More redness, swelling, or pain. ? More fluid or blood. ? Warmth. ?  Pus or a bad smell. Bathing  Do not take baths, swim, or use a hot tub until your health care provider approves. You may start taking showers two days after your procedure.  Do not rub your incision to dry it. Pat the area gently with a clean cloth or let it air-dry. Medicines  Take over-the-counter and prescription medicines only as told by your health care provider.  If you were  prescribed an antibiotic medicine, use it as told by your health care provider. Do not stop using the antibiotic even if you start to feel better.  If you had both testicles removed, talk with your health care provider about medicine supplements to replace one of the male hormones (testosterone) that your body will no longer make. Driving  Do not drive for 24 hours if you were given a medicine to help you relax (sedative).  Do not drive or use heavy machinery while taking prescription pain medicines. Activity  Avoid activities that may cause your incision to open, such as jogging, playing sports, and straining with bowel movements. Ask your health care provider what activities are safe for you.  Do not lift anything that is heavier than 10 lb (4.5 kg), or the limit that your health care provider tells you, until he or she says that it is safe.  Do not engage in sexual activity until the area is healed and your health care provider approves. This could take up to 4 weeks. General instructions  To prevent or treat constipation while you are taking prescription pain medicine, your health care provider may recommend that you: ? Drink enough fluid to keep your urine clear or pale yellow. ? Take over-the-counter or prescription medicines. ? Eat foods that are high in fiber, such as fresh fruits and vegetables, whole grains, and beans. ? Limit foods that are high in fat and processed sugars, such as fried and sweet foods.  Do not use any products that contain nicotine or tobacco, such as cigarettes and e-cigarettes. If you need help quitting, ask your health care provider.  Keep all follow-up visits as told by your health care provider. This is important. Contact a health care provider if:  You have more pain, swelling, or redness in your genital or groin area.  You have more fluid or blood coming from your incision.  Your incision feels warm to the touch.  You have pus or a bad smell  coming from your incision.  You have constipation that is not helped by changing your diet or drinking more fluid.  You develop nausea or vomiting.  You cannot eat or drink without vomiting. Get help right away if:  You have dizziness or nausea that does not go away.  You have trouble breathing.  You have a wet (congested) cough.  You have a fever or shaking chills.  Your incision breaks open after the skin closures have been removed.  You are not able to urinate. Summary  After this procedure, it is most common to have bruising or blood pooling in the area where the testicles were removed.  You should check your incision area every day for signs of infection, such as redness, swelling, pain, fluid, blood, warmth, pus, or a bad smell.  Avoid activities that may cause your incision to open, such as jogging, playing sports, and straining with bowel movements. Ask your health care provider what activities are safe for you.  You should not engage in sexual activity until the area is healed and  your health care provider approves. This could take up to 4 weeks.  Men who have both testicles removed may have emotional and physical side effects. Your health care provider can help you with ways to manage those side effects. This information is not intended to replace advice given to you by your health care provider. Make sure you discuss any questions you have with your health care provider. Document Released: 05/28/2013 Document Revised: 08/17/2016 Document Reviewed: 08/17/2016 Elsevier Interactive Patient Education  2017 [employer].

## 2017-05-31 NOTE — Brief Op Note (Signed)
05/31/2017  10:48 AM  PATIENT:  Brian Mccullough  66 y.o. male  PRE-OPERATIVE DIAGNOSIS:  LEFT TESTICULAR MASS  POST-OPERATIVE DIAGNOSIS:  LEFT TESTICULAR MASS  PROCEDURE:  Procedure(s): LEFT RADICAL ORCHIECTOMY (Left)  SURGEON:  Surgeon(s) and Role:    * Irine Seal, MD - Primary  PHYSICIAN ASSISTANT:   ASSISTANTS: none   ANESTHESIA:   general  EBL:  Total I/O In: 700 [I.V.:700] Out: -   BLOOD ADMINISTERED:none  DRAINS: none   LOCAL MEDICATIONS USED:  MARCAINE    and Amount: 15 ml 0.25%  SPECIMEN:  Source of Specimen:  left testicle and left inguinal node  DISPOSITION OF SPECIMEN:  PATHOLOGY  COUNTS:  YES  TOURNIQUET:  * No tourniquets in log *  DICTATION: .Other Dictation: Dictation Number [id number]  PLAN OF CARE: Discharge to home after PACU  PATIENT DISPOSITION:  PACU - hemodynamically stable.   Delay start of Pharmacological VTE agent (>24hrs) due to surgical blood loss or risk of bleeding: not applicable

## 2017-05-31 NOTE — Anesthesia Procedure Notes (Signed)
Procedure Name: LMA Insertion Date/Time: 05/31/2017 9:47 AM Performed by: Murvin Natal Pre-anesthesia Checklist: Patient identified, Emergency Drugs available, Suction available and Patient being monitored Patient Re-evaluated:Patient Re-evaluated prior to induction Oxygen Delivery Method: Circle system utilized Preoxygenation: Pre-oxygenation with 100% oxygen Induction Type: IV induction Ventilation: Mask ventilation without difficulty LMA: LMA inserted LMA Size: 5.0 Number of attempts: 1 Placement Confirmation: positive ETCO2 Tube secured with: Tape Dental Injury: Teeth and Oropharynx as per pre-operative assessment

## 2017-05-31 NOTE — Interval H&P Note (Signed)
History and Physical Interval Note:  05/31/2017 8:28 AM  Brian Mccullough  has presented today for surgery, with the diagnosis of LEFT TESTICULAR MASS  The various methods of treatment have been discussed with the patient and family. After consideration of risks, benefits and other options for treatment, the patient has consented to  Procedure(s): LEFT RADICAL ORCHIECTOMY (Left) as a surgical intervention .  The patient's history has been reviewed, patient examined, no change in status, stable for surgery.  I have reviewed the patient's chart and labs.  Questions were answered to the patient's satisfaction.     [name]

## 2017-05-31 NOTE — Transfer of Care (Signed)
Last Vitals:  Vitals:   05/31/17 0745 05/31/17 1100  BP: 136/75 (!) 149/79  Pulse: 63 70  Resp: 16 (!) 9  Temp: 37.2 C (!) 36.3 C  SpO2: 100% 100%    Last Pain:  Vitals:   05/31/17 0745  TempSrc: Oral      Patients Stated Pain Goal: 6 (05/31/17 0800)  Immediate Anesthesia Transfer of Care Note  Patient: Brian Mccullough  Procedure(s) Performed: Procedure(s) (LRB): LEFT RADICAL ORCHIECTOMY (Left)  Patient Location: PACU  Anesthesia Type: General  Level of Consciousness: awake, alert  and oriented  Airway & Oxygen Therapy: Patient Spontanous Breathing and Patient connected to nasal cannula oxygen  Post-op Assessment: Report given to PACU RN and Post -op Vital signs reviewed and stable  Post vital signs: Reviewed and stable  Complications: No apparent anesthesia complications

## 2017-06-01 ENCOUNTER — Encounter (HOSPITAL_BASED_OUTPATIENT_CLINIC_OR_DEPARTMENT_OTHER): Payer: Self-pay | Admitting: Urology

## 2017-06-01 NOTE — Op Note (Signed)
NAMEJERMICHAEL, BELMARES              ACCOUNT NO.:  [id number]  MEDICAL RECORD NO.:  [id number]  LOCATION:                                 FACILITY:  PHYSICIAN:  Marshall Cork. Jeffie Pollock, M.D.         DATE OF BIRTH:  DATE OF PROCEDURE:  05/31/2017 DATE OF DISCHARGE:                              OPERATIVE REPORT   PREOPERATIVE DIAGNOSIS:  Left testicular mass.  POSTOPERATIVE DIAGNOSIS:  Left testicular mass.  PROCEDURE:  Left inguinal and scrotal exploration with left orchiectomy.  SURGEON:  Marshall Cork. Jeffie Pollock, M.D.  ANESTHESIA:  General.  SPECIMEN:  Left testicle and left inguinal node.  DRAINS:  None.  BLOOD LOSS:  Minimal.  COMPLICATIONS:  None.  INDICATIONS:  Mr. Sites is a 66 year old African American male who had undergone a prior left inguinal hernia.  He had left testicular atrophy and pain, and on ultrasound, had a hypoechoic area in the testicle, which is worrisome for neoplasm.  It was felt that radical orchectomy was indicated.  FINDINGS AND PROCEDURE:  He was taken to the operating room where he was given Ancef.  A general anesthetic was induced.  He was placed in a supine position.  His lower abdomen and genitalia were clipped.  He was prepped with Betadine solution, draped in usual sterile fashion.  His prior inguinal incision was opened with a knife and carried down through the fat and subcutaneous tissues with the Bovie.  There was fair amount of residual scarring from the prior hernia repair.  Initially, I felt I had the cord up, but on closer inspection, it was just fat adjacent to the cord.  There was a prominent lymph node, so this material was removed and sent for permanent section.  At this point, attempts were made to palpate the testicle, which was quite small and pushed it up into the inguinal area to aid dissection since I could not easily find the cord and was afraid that further dissection might be problematic in this area.  So, I made a small incision in  the left upper scrotum, exposing the testicle, it was quite atrophied with some minimal induration of the upper pole.  The testicle was densely adherent to the surrounding tissues and a Bovie was required to dissect the cord away from the surrounding tissues up to the level of the inguinal area.  I was unable to pass it through into the inguinal incision, but dissected the cord as high as I could through the scrotal incision.  The cord was then broken down into two packets using hemostats, they were clamped.  The specimen was removed and each of the packets was doubly ligated with a 0 Vicryl tie.  Inspection revealed no bleeding in the scrotal or inguinal incision. The scrotal incision was closed using a running 3-0 chromic on the dartos and interrupted vertical mattress, 3-0 chromic on the skin.  The wound was infiltrated with approximately 4-5 mL of 0.25% Marcaine.  The angle incision was then infiltrated with about 10 mL of 0.25% Marcaine.  A subcutaneous layer of interrupted 3-0 chromic was placed followed by a 4-0 intracuticular Monocryl stitch reinforced with Dermabond after the wound  was cleaned and dried.  A dressing of 4x4s was applied followed by scrotal support.  The patient's anesthetic was reversed.  He was moved to the recovery room in stable condition.  There were no complications.     Marshall Cork. Jeffie Pollock, M.D.   ______________________________ Marshall Cork. Jeffie Pollock, M.D.    JJW/MEDQ  D:  05/31/2017  T:  05/31/2017  Job:  [id number]

## 2017-06-01 NOTE — Anesthesia Postprocedure Evaluation (Signed)
Anesthesia Post Note  Patient: Brian Mccullough  Procedure(s) Performed: Procedure(s) (LRB): LEFT RADICAL ORCHIECTOMY (Left)     Patient location during evaluation: PACU Anesthesia Type: General Level of consciousness: awake and alert Pain management: pain level controlled Vital Signs Assessment: post-procedure vital signs reviewed and stable Respiratory status: spontaneous breathing, nonlabored ventilation, respiratory function stable and patient connected to nasal cannula oxygen Cardiovascular status: blood pressure returned to baseline and stable Postop Assessment: no signs of nausea or vomiting Anesthetic complications: no    Last Vitals:  Vitals:   05/31/17 1145 05/31/17 1245  BP: 134/72 (!) 160/78  Pulse: (!) 58 (!) 47  Resp: 15 16  Temp:  36.9 C  SpO2: 100% 98%    Last Pain:  Vitals:   05/31/17 1215  TempSrc:   PainSc: 4                  Marinda Tyer P Senaida Chilcote

## 2017-06-07 DIAGNOSIS — N500 Atrophy of testis: Secondary | ICD-10-CM | POA: Diagnosis not present

## 2017-06-26 ENCOUNTER — Telehealth: Payer: Self-pay | Admitting: Internal Medicine

## 2017-06-26 NOTE — Telephone Encounter (Signed)
Check  registry last filled 04/21/2017...Brian Mccullough

## 2017-06-26 NOTE — Telephone Encounter (Signed)
Patient is requesting refill on tramadol to be sent to [facility] at [facility] and [address].  Patient is currently out of medication.  States pharmacy has been requesting for a week but do not see anything in chart.

## 2017-06-26 NOTE — Telephone Encounter (Signed)
OK to fill this/these prescription(s) with additional refills x2 Thank you!  

## 2017-06-27 MED ORDER — TRAMADOL HCL 50 MG PO TABS: ORAL_TABLET | ORAL | 2 refills | Status: DC

## 2017-06-27 NOTE — Telephone Encounter (Signed)
Notified pt MD ok refill has been called into [facility] had to leave on pharmacy vm.Marland KitchenJohny Chess

## 2017-08-14 ENCOUNTER — Other Ambulatory Visit (INDEPENDENT_AMBULATORY_CARE_PROVIDER_SITE_OTHER): Payer: Medicare Other

## 2017-08-14 ENCOUNTER — Encounter: Payer: Self-pay | Admitting: Internal Medicine

## 2017-08-14 ENCOUNTER — Encounter: Payer: Medicare Other | Admitting: Internal Medicine

## 2017-08-14 ENCOUNTER — Ambulatory Visit (INDEPENDENT_AMBULATORY_CARE_PROVIDER_SITE_OTHER): Payer: Medicare Other | Admitting: Internal Medicine

## 2017-08-14 VITALS — BP 120/82 | HR 57 | Temp 99.6°F | Ht 75.5 in | Wt 221.0 lb

## 2017-08-14 DIAGNOSIS — G8929 Other chronic pain: Secondary | ICD-10-CM

## 2017-08-14 DIAGNOSIS — Z23 Encounter for immunization: Secondary | ICD-10-CM | POA: Diagnosis not present

## 2017-08-14 DIAGNOSIS — M5442 Lumbago with sciatica, left side: Secondary | ICD-10-CM

## 2017-08-14 DIAGNOSIS — M5441 Lumbago with sciatica, right side: Secondary | ICD-10-CM

## 2017-08-14 DIAGNOSIS — E119 Type 2 diabetes mellitus without complications: Secondary | ICD-10-CM | POA: Diagnosis not present

## 2017-08-14 DIAGNOSIS — Z0000 Encounter for general adult medical examination without abnormal findings: Secondary | ICD-10-CM

## 2017-08-14 DIAGNOSIS — E538 Deficiency of other specified B group vitamins: Secondary | ICD-10-CM

## 2017-08-14 DIAGNOSIS — I10 Essential (primary) hypertension: Secondary | ICD-10-CM

## 2017-08-14 DIAGNOSIS — E559 Vitamin D deficiency, unspecified: Secondary | ICD-10-CM

## 2017-08-14 LAB — LIPID PANEL
CHOL/HDL RATIO: 5
Cholesterol: 167 mg/dL (ref 0–200)
HDL: 35.7 mg/dL — AB (ref >39.00)
LDL CALC: 110 mg/dL — AB (ref 0–99)
NONHDL: 130.98
Triglycerides: 107 mg/dL (ref 0.0–149.0)
VLDL: 21.4 mg/dL (ref 0.0–40.0)

## 2017-08-14 LAB — CBC WITH DIFFERENTIAL/PLATELET
BASOS ABS: 0 10*3/uL (ref 0.0–0.1)
Basophils Relative: 1.2 % (ref 0.0–3.0)
Eosinophils Absolute: 0.1 10*3/uL (ref 0.0–0.7)
Eosinophils Relative: 3.6 % (ref 0.0–5.0)
HCT: 46.1 % (ref 39.0–52.0)
Hemoglobin: 15.7 g/dL (ref 13.0–17.0)
Lymphocytes Relative: 33 % (ref 12.0–46.0)
Lymphs Abs: 1.3 10*3/uL (ref 0.7–4.0)
MCHC: 34 g/dL (ref 30.0–36.0)
MCV: 91.6 fl (ref 78.0–100.0)
MONO ABS: 0.4 10*3/uL (ref 0.1–1.0)
Monocytes Relative: 11.1 % (ref 3.0–12.0)
NEUTROS PCT: 51.1 % (ref 43.0–77.0)
Neutro Abs: 2 10*3/uL (ref 1.4–7.7)
Platelets: 173 10*3/uL (ref 150.0–400.0)
RBC: 5.03 Mil/uL (ref 4.22–5.81)
RDW: 13.6 % (ref 11.5–15.5)
WBC: 3.9 10*3/uL — ABNORMAL LOW (ref 4.0–10.5)

## 2017-08-14 LAB — URINALYSIS
BILIRUBIN URINE: NEGATIVE
HGB URINE DIPSTICK: NEGATIVE
KETONES UR: NEGATIVE
LEUKOCYTES UA: NEGATIVE
NITRITE: NEGATIVE
Specific Gravity, Urine: <=1.005 — AB (ref 1.000–1.030)
TOTAL PROTEIN, URINE-UPE24: NEGATIVE
Urine Glucose: NEGATIVE
Urobilinogen, UA: 0.2 (ref 0.0–1.0)
pH: 5.5 (ref 5.0–8.0)

## 2017-08-14 LAB — TSH: TSH: 0.74 u[IU]/mL (ref 0.35–4.50)

## 2017-08-14 LAB — BASIC METABOLIC PANEL
BUN: 10 mg/dL (ref 6–23)
CHLORIDE: 106 meq/L (ref 96–112)
CO2: 29 mEq/L (ref 19–32)
Calcium: 9.7 mg/dL (ref 8.4–10.5)
Creatinine, Ser: 0.84 mg/dL (ref 0.40–1.50)
GFR: 117.77 mL/min (ref >60.00)
GLUCOSE: 83 mg/dL (ref 70–99)
POTASSIUM: 4.5 meq/L (ref 3.5–5.1)
Sodium: 140 mEq/L (ref 135–145)

## 2017-08-14 LAB — HEMOGLOBIN A1C: Hgb A1c MFr Bld: 5.2 % (ref 4.6–6.5)

## 2017-08-14 LAB — HEPATIC FUNCTION PANEL
ALT: 4 U/L (ref 0–53)
AST: 8 U/L (ref 0–37)
Albumin: 4.2 g/dL (ref 3.5–5.2)
Alkaline Phosphatase: 56 U/L (ref 39–117)
BILIRUBIN DIRECT: 0.1 mg/dL (ref 0.0–0.3)
BILIRUBIN TOTAL: 0.6 mg/dL (ref 0.2–1.2)
Total Protein: 6.9 g/dL (ref 6.0–8.3)

## 2017-08-14 LAB — MICROALBUMIN / CREATININE URINE RATIO
Creatinine,U: 54.1 mg/dL
Microalb Creat Ratio: 1.3 mg/g (ref 0.0–30.0)
Microalb, Ur: <0.7 mg/dL (ref 0.0–1.9)

## 2017-08-14 LAB — PSA: PSA: 0.21 ng/mL (ref 0.10–4.00)

## 2017-08-14 MED ORDER — TRIAMCINOLONE ACETONIDE 0.1 % EX OINT: 1.0000 "application " | TOPICAL_OINTMENT | Freq: Two times a day (BID) | CUTANEOUS | 0 refills | Status: DC | PRN

## 2017-08-14 MED ORDER — ZOSTER VAC RECOMB ADJUVANTED 50 MCG/0.5ML IM SUSR: 0.5000 mL | Freq: Once | INTRAMUSCULAR | 1 refills | Status: AC

## 2017-08-14 NOTE — Progress Notes (Signed)
Subjective:  Patient ID: Brian Mccullough, male    DOB: 1951/08/03  Age: 66 y.o. MRN: [id number]  CC: No chief complaint on file.   HPI Brian Mccullough presents for a well exam F/u his testicular surgery - doing well F/u HTN, DM    Outpatient Medications Prior to Visit  Medication Sig Dispense Refill  . aspirin 81 MG EC tablet Take 81 mg by mouth daily.      . benazepril (LOTENSIN) 40 MG tablet Take 0.5 tablets (20 mg total) by mouth daily. 90 tablet 3  . Cholecalciferol (VITAMIN D3) 1000 UNITS tablet Take 1,000 Units by mouth daily.     . cyanocobalamin 100 MCG tablet Take 1,000 mcg by mouth daily.     . meloxicam (MOBIC) 15 MG tablet Take 1 tablet (15 mg total) by mouth daily as needed for pain. Annual appt due in Oct must see provider for future refills 90 tablet 0  . metFORMIN (GLUCOPHAGE) 500 MG tablet Take 1 tablet by mouth  every day with breakfast 90 tablet 3  . metoprolol tartrate (LOPRESSOR) 25 MG tablet Take 1 to 2 tablets by  mouth two times daily as  needed for palpitations 360 tablet 3  . potassium chloride SA (K-DUR,KLOR-CON) 20 MEQ tablet Take 1 tablet (20 mEq total) by mouth daily. 90 tablet 3  . tiZANidine (ZANAFLEX) 4 MG tablet TAKE 1 TABLET(4 MG) BY MOUTH EVERY 8 HOURS AS NEEDED FOR MUSCLE SPASMS 270 tablet 0  . traMADol (ULTRAM) 50 MG tablet TAKE 1-2 TABLETS BY MOUTH TWICE DAILY AS NEEDED 100 tablet 2  . traZODone (DESYREL) 50 MG tablet TAKE 0.5-1 TABLETS BY MOUTH AT BEDTIME AS NEEDED FOR  SLEEP. 90 tablet 3  . triamcinolone ointment (KENALOG) 0.1 % Apply 1 application topically 2 (two) times daily as needed (rash). 80 g 0  . amLODipine (NORVASC) 5 MG tablet Take 1 tablet (5 mg total) by mouth daily. Annual appt due in Oct must see provider for future refills (Patient not taking: Reported on 08/14/2017) 90 tablet 0   No facility-administered medications prior to visit.     ROS Review of Systems  Constitutional: Negative for appetite change, fatigue and  unexpected weight change.  HENT: Negative for congestion, nosebleeds, sneezing, sore throat and trouble swallowing.   Eyes: Negative for itching and visual disturbance.  Respiratory: Negative for cough.   Cardiovascular: Negative for chest pain, palpitations and leg swelling.  Gastrointestinal: Negative for abdominal distention, blood in stool, diarrhea and nausea.  Genitourinary: Negative for frequency and hematuria.  Musculoskeletal: Positive for arthralgias. Negative for back pain, gait problem, joint swelling and neck pain.  Skin: Negative for rash.  Neurological: Negative for dizziness, tremors, speech difficulty and weakness.  Psychiatric/Behavioral: Negative for agitation, dysphoric mood and sleep disturbance. The patient is not nervous/anxious.     Objective:  BP 120/82 (BP Location: Left Arm, Patient Position: Sitting, Cuff Size: Large)   Pulse (!) 57   Temp 99.6 F (37.6 C) (Oral)   Ht 6' 3.5" (1.918 m)   Wt 221 lb (100.2 kg)   SpO2 98%   BMI 27.26 kg/m   BP Readings from Last 3 Encounters:  08/14/17 120/82  05/31/17 (!) 160/78  04/18/17 (!) 156/83    Wt Readings from Last 3 Encounters:  08/14/17 221 lb (100.2 kg)  05/31/17 221 lb 8 oz (100.5 kg)  04/18/17 231 lb (104.8 kg)    Physical Exam  Constitutional: He is oriented to person, place, and time.  He appears well-developed. No distress.  NAD  HENT:  Mouth/Throat: Oropharynx is clear and moist.  Eyes: Conjunctivae are normal. Pupils are equal, round, and reactive to light.  Neck: Normal range of motion. No JVD present. No thyromegaly present.  Cardiovascular: Normal rate, regular rhythm, normal heart sounds and intact distal pulses. Exam reveals no gallop and no friction rub.  No murmur heard. Pulmonary/Chest: Effort normal and breath sounds normal. No respiratory distress. He has no wheezes. He has no rales. He exhibits no tenderness.  Abdominal: Soft. Bowel sounds are normal. He exhibits no distension and no  mass. There is no tenderness. There is no rebound and no guarding.  Musculoskeletal: Normal range of motion. He exhibits no edema or tenderness.  Lymphadenopathy:    He has no cervical adenopathy.  Neurological: He is alert and oriented to person, place, and time. He has normal reflexes. No cranial nerve deficit. He exhibits normal muscle tone. He displays a negative Romberg sign. Coordination and gait normal.  Skin: Skin is warm and dry. No rash noted.  Psychiatric: He has a normal mood and affect. His behavior is normal. Judgment and thought content normal.  recent rectal per Dr Fuller Plan  Lab Results  Component Value Date   WBC 4.9 03/27/2016   HGB 15.0 05/31/2017   HCT 44.0 05/31/2017   PLT 188.0 03/27/2016   GLUCOSE 81 05/31/2017   CHOL 175 03/27/2016   TRIG 109.0 03/27/2016   HDL 33.60 (L) 03/27/2016   LDLDIRECT 88.1 01/23/2011   LDLCALC 120 (H) 03/27/2016   ALT 6 03/27/2016   AST 10 03/27/2016   NA 144 05/31/2017   K 3.7 05/31/2017   CL 110 04/09/2017   CREATININE 0.88 04/09/2017   BUN 12 04/09/2017   CO2 25 04/09/2017   TSH 0.63 03/27/2016   PSA 0.25 03/27/2016   HGBA1C 5.2 04/09/2017   MICROALBUR 0.7 08/01/2016    No results found.  Assessment & Plan:   There are no diagnoses linked to this encounter. I have discontinued Devinn L. Stella's amLODipine. I am also having him maintain his aspirin, cyanocobalamin, cholecalciferol, traZODone, triamcinolone ointment, metFORMIN, metoprolol tartrate, potassium chloride SA, tiZANidine, benazepril, meloxicam, and traMADol.  No orders of the defined types were placed in this encounter.    Follow-up: No Follow-up on file.  Walker Kehr, MD

## 2017-08-14 NOTE — Assessment & Plan Note (Signed)
Here for medicare wellness/physical  Diet: heart healthy  Physical activity: not sedentary  Depression/mood screen: negative  Hearing: intact to whispered voice  Visual acuity: grossly normal, performs annual eye exam  ADLs: capable  Fall risk: none  Home safety: good  Cognitive evaluation: intact to orientation, naming, recall and repetition  EOL planning: adv directives, full code/ I agree  I have personally reviewed and have noted  1. The patient's medical and social history  2. Their use of alcohol, tobacco or illicit drugs  3. Their current medications and supplements  4. The patient's functional ability including ADL's, fall risks, home safety risks and hearing or visual impairment.  5. Diet and physical activities  6. Evidence for depression or mood disorders 7. Providers roster reviewed   Today patient counseled on age appropriate routine health concerns for screening and prevention, each reviewed and up to date or declined. Immunizations reviewed and up to date or declined. Labs ordered and reviewed. Risk factors for depression reviewed and negative. Hearing function and visual acuity are intact. ADLs screened and addressed as needed. Functional ability and level of safety reviewed and appropriate. Education, counseling and referrals performed based on assessed risks today. Patient provided with a copy of personalized plan for preventive services.   Colon Dr Fuller Plan 2018, due in 2023

## 2017-08-14 NOTE — Assessment & Plan Note (Signed)
On Vit D 

## 2017-08-14 NOTE — Patient Instructions (Signed)

## 2017-08-14 NOTE — Assessment & Plan Note (Signed)
On VB12

## 2017-08-14 NOTE — Addendum Note (Signed)
Addended by: Karren Cobble on: 08/14/2017 12:41 PM   Modules accepted: Orders

## 2017-08-14 NOTE — Assessment & Plan Note (Signed)
Benazepril was reduced to 20 mg/d - no lightheadedness

## 2017-08-14 NOTE — Assessment & Plan Note (Signed)
Labs

## 2017-08-14 NOTE — Assessment & Plan Note (Signed)
Tramadol prn ° Potential benefits of a long term opioids use as well as potential risks (i.e. addiction risk, apnea etc) and complications (i.e. Somnolence, constipation and others) were explained to the patient and were aknowledged. ° ° °

## 2017-08-21 ENCOUNTER — Other Ambulatory Visit: Payer: Self-pay | Admitting: Internal Medicine

## 2017-09-18 ENCOUNTER — Other Ambulatory Visit: Payer: Self-pay | Admitting: Internal Medicine

## 2017-11-06 ENCOUNTER — Other Ambulatory Visit: Payer: Self-pay | Admitting: Internal Medicine

## 2017-11-15 ENCOUNTER — Ambulatory Visit (INDEPENDENT_AMBULATORY_CARE_PROVIDER_SITE_OTHER): Payer: Medicare Other | Admitting: Internal Medicine

## 2017-11-15 ENCOUNTER — Encounter: Payer: Self-pay | Admitting: Internal Medicine

## 2017-11-15 ENCOUNTER — Other Ambulatory Visit (INDEPENDENT_AMBULATORY_CARE_PROVIDER_SITE_OTHER): Payer: Medicare Other

## 2017-11-15 VITALS — BP 116/74 | HR 48 | Temp 99.0°F | Ht 76.0 in | Wt 224.0 lb

## 2017-11-15 DIAGNOSIS — M5442 Lumbago with sciatica, left side: Secondary | ICD-10-CM

## 2017-11-15 DIAGNOSIS — E538 Deficiency of other specified B group vitamins: Secondary | ICD-10-CM

## 2017-11-15 DIAGNOSIS — Z23 Encounter for immunization: Secondary | ICD-10-CM

## 2017-11-15 DIAGNOSIS — E119 Type 2 diabetes mellitus without complications: Secondary | ICD-10-CM

## 2017-11-15 DIAGNOSIS — I10 Essential (primary) hypertension: Secondary | ICD-10-CM

## 2017-11-15 DIAGNOSIS — M5441 Lumbago with sciatica, right side: Secondary | ICD-10-CM | POA: Diagnosis not present

## 2017-11-15 DIAGNOSIS — G8929 Other chronic pain: Secondary | ICD-10-CM

## 2017-11-15 LAB — BASIC METABOLIC PANEL
BUN: 11 mg/dL (ref 6–23)
CO2: 29 meq/L (ref 19–32)
CREATININE: 0.92 mg/dL (ref 0.40–1.50)
Calcium: 9.2 mg/dL (ref 8.4–10.5)
Chloride: 104 mEq/L (ref 96–112)
GFR: 105.95 mL/min (ref >60.00)
GLUCOSE: 82 mg/dL (ref 70–99)
Potassium: 4.3 mEq/L (ref 3.5–5.1)
SODIUM: 137 meq/L (ref 135–145)

## 2017-11-15 LAB — HEMOGLOBIN A1C: HEMOGLOBIN A1C: 5.5 % (ref 4.6–6.5)

## 2017-11-15 MED ORDER — BENAZEPRIL HCL 20 MG PO TABS
20.0000 mg | ORAL_TABLET | Freq: Every day | ORAL | 3 refills | Status: DC
Start: 2017-11-15 — End: 2018-12-16

## 2017-11-15 MED ORDER — POTASSIUM CHLORIDE CRYS ER 20 MEQ PO TBCR: 20.0000 meq | EXTENDED_RELEASE_TABLET | Freq: Every day | ORAL | 3 refills | Status: DC

## 2017-11-15 MED ORDER — METFORMIN HCL 500 MG PO TABS: 500.0000 mg | ORAL_TABLET | Freq: Every day | ORAL | 3 refills | Status: DC

## 2017-11-15 MED ORDER — TRAZODONE HCL 50 MG PO TABS: ORAL_TABLET | ORAL | 3 refills | Status: DC

## 2017-11-15 MED ORDER — MELOXICAM 15 MG PO TABS: 15.0000 mg | ORAL_TABLET | Freq: Every day | ORAL | 3 refills | Status: DC | PRN

## 2017-11-15 MED ORDER — TRAMADOL HCL 50 MG PO TABS: ORAL_TABLET | ORAL | 3 refills | Status: DC

## 2017-11-15 MED ORDER — TIZANIDINE HCL 4 MG PO TABS: ORAL_TABLET | ORAL | 1 refills | Status: DC

## 2017-11-15 NOTE — Patient Instructions (Signed)
Wt Readings from Last 3 Encounters:  11/15/17 224 lb (101.6 kg)  08/14/17 221 lb (100.2 kg)  05/31/17 221 lb 8 oz (100.5 kg)

## 2017-11-15 NOTE — Progress Notes (Signed)
Subjective:  Patient ID: Brian Mccullough, male    DOB: [birth year]  Age: 67 y.o. MRN: [id number]  CC: No chief complaint on file.   HPI Brian Mccullough presents for DM, HTN, OA f/u  Outpatient Medications Prior to Visit  Medication Sig Dispense Refill  . aspirin 81 MG EC tablet Take 81 mg by mouth daily.      . Cholecalciferol (VITAMIN D3) 1000 UNITS tablet Take 1,000 Units by mouth daily.     . cyanocobalamin 100 MCG tablet Take 1,000 mcg by mouth daily.     . metoprolol tartrate (LOPRESSOR) 25 MG tablet Take 1 to 2 tablets by  mouth two times daily as  needed for palpitations 360 tablet 3  . triamcinolone ointment (KENALOG) 0.1 % Apply 1 application 2 (two) times daily as needed topically (rash). 80 g 0  . benazepril (LOTENSIN) 40 MG tablet Take 0.5 tablets (20 mg total) by mouth daily. 90 tablet 3  . benazepril (LOTENSIN) 40 MG tablet TAKE 1 TABLET BY MOUTH  DAILY (Patient taking differently: TAKE 1/2 TABLET BY MOUTH  DAILY) 90 tablet 3  . meloxicam (MOBIC) 15 MG tablet TAKE 1 TABLET BY MOUTH  DAILY AS NEEDED FOR PAIN. 90 tablet 0  . metFORMIN (GLUCOPHAGE) 500 MG tablet TAKE 1 TABLET BY MOUTH  EVERY DAY WITH BREAKFAST 90 tablet 3  . potassium chloride SA (K-DUR,KLOR-CON) 20 MEQ tablet TAKE 1 TABLET BY MOUTH  DAILY 90 tablet 3  . tiZANidine (ZANAFLEX) 4 MG tablet TAKE 1 TABLET BY MOUTH  EVERY 8 HOURS AS NEEDED FOR MUSCLE SPASM(S) 270 tablet 0  . traMADol (ULTRAM) 50 MG tablet TAKE 1-2 TABLETS BY MOUTH TWICE DAILY AS NEEDED 100 tablet 2  . traZODone (DESYREL) 50 MG tablet TAKE 0.5-1 TABLETS BY MOUTH AT BEDTIME AS NEEDED FOR  SLEEP. 90 tablet 3  . amLODipine (NORVASC) 5 MG tablet TAKE 1 TABLET BY MOUTH  DAILY. 90 tablet 3   No facility-administered medications prior to visit.     ROS Review of Systems  Constitutional: Negative for appetite change, fatigue and unexpected weight change.  HENT: Negative for congestion, nosebleeds, sneezing, sore throat and trouble swallowing.   Eyes:  Negative for itching and visual disturbance.  Respiratory: Negative for cough.   Cardiovascular: Negative for chest pain, palpitations and leg swelling.  Gastrointestinal: Negative for abdominal distention, blood in stool, diarrhea and nausea.  Genitourinary: Negative for frequency and hematuria.  Musculoskeletal: Negative for back pain, gait problem, joint swelling and neck pain.  Skin: Negative for rash.  Neurological: Negative for dizziness, tremors, speech difficulty and weakness.  Psychiatric/Behavioral: Negative for agitation, dysphoric mood and sleep disturbance. The patient is not nervous/anxious.     Objective:  BP 116/74 (BP Location: Left Arm, Patient Position: Sitting, Cuff Size: Large)   Pulse (!) 48   Temp 99 F (37.2 C) (Oral)   Ht 6\' 4"  (1.93 m)   Wt 224 lb (101.6 kg)   SpO2 98%   BMI 27.27 kg/m   BP Readings from Last 3 Encounters:  11/15/17 116/74  08/14/17 120/82  05/31/17 (!) 160/78    Wt Readings from Last 3 Encounters:  11/15/17 224 lb (101.6 kg)  08/14/17 221 lb (100.2 kg)  05/31/17 221 lb 8 oz (100.5 kg)    Physical Exam  Constitutional: He is oriented to person, place, and time. He appears well-developed. No distress.  NAD  HENT:  Mouth/Throat: Oropharynx is clear and moist.  Eyes: Conjunctivae are normal. Pupils  are equal, round, and reactive to light.  Neck: Normal range of motion. No JVD present. No thyromegaly present.  Cardiovascular: Normal rate, regular rhythm, normal heart sounds and intact distal pulses. Exam reveals no gallop and no friction rub.  No murmur heard. Pulmonary/Chest: Effort normal and breath sounds normal. No respiratory distress. He has no wheezes. He has no rales. He exhibits no tenderness.  Abdominal: Soft. Bowel sounds are normal. He exhibits no distension and no mass. There is no tenderness. There is no rebound and no guarding.  Musculoskeletal: Normal range of motion. He exhibits no edema or tenderness.    Lymphadenopathy:    He has no cervical adenopathy.  Neurological: He is alert and oriented to person, place, and time. He has normal reflexes. No cranial nerve deficit. He exhibits normal muscle tone. He displays a negative Romberg sign. Coordination and gait normal.  Skin: Skin is warm and dry. No rash noted.  Psychiatric: He has a normal mood and affect. His behavior is normal. Judgment and thought content normal.  HR in the 60s  Lab Results  Component Value Date   WBC 3.9 (L) 08/14/2017   HGB 15.7 08/14/2017   HCT 46.1 08/14/2017   PLT 173.0 08/14/2017   GLUCOSE 83 08/14/2017   CHOL 167 08/14/2017   TRIG 107.0 08/14/2017   HDL 35.70 (L) 08/14/2017   LDLDIRECT 88.1 01/23/2011   LDLCALC 110 (H) 08/14/2017   ALT 4 08/14/2017   AST 8 08/14/2017   NA 140 08/14/2017   K 4.5 08/14/2017   CL 106 08/14/2017   CREATININE 0.84 08/14/2017   BUN 10 08/14/2017   CO2 29 08/14/2017   TSH 0.74 08/14/2017   PSA 0.21 08/14/2017   HGBA1C 5.2 08/14/2017   MICROALBUR <0.7 08/14/2017    No results found.  Assessment & Plan:   Diagnoses and all orders for this visit:  Need for pneumococcal vaccination -     Pneumococcal polysaccharide vaccine 23-valent greater than or equal to 2yo subcutaneous/IM  Other orders -     benazepril (LOTENSIN) 20 MG tablet; Take 1 tablet (20 mg total) by mouth daily. -     meloxicam (MOBIC) 15 MG tablet; Take 1 tablet (15 mg total) by mouth daily as needed. for pain -     metFORMIN (GLUCOPHAGE) 500 MG tablet; Take 1 tablet (500 mg total) by mouth daily with breakfast. -     potassium chloride SA (K-DUR,KLOR-CON) 20 MEQ tablet; Take 1 tablet (20 mEq total) by mouth daily. -     tiZANidine (ZANAFLEX) 4 MG tablet; TAKE 1 TABLET BY MOUTH  EVERY 8 HOURS AS NEEDED FOR MUSCLE SPASM(S) -     traMADol (ULTRAM) 50 MG tablet; TAKE 1-2 TABLETS BY MOUTH TWICE DAILY AS NEEDED -     traZODone (DESYREL) 50 MG tablet; TAKE 0.5-1 TABLETS BY MOUTH AT BEDTIME AS NEEDED FOR   SLEEP.   I have discontinued Brian Mccullough's benazepril, amLODipine, and benazepril. I have also changed his meloxicam, metFORMIN, and potassium chloride SA. Additionally, I am having him start on benazepril. Lastly, I am having him maintain his aspirin, cyanocobalamin, cholecalciferol, metoprolol tartrate, triamcinolone ointment, tiZANidine, traMADol, and traZODone.  Meds ordered this encounter  Medications  . benazepril (LOTENSIN) 20 MG tablet    Sig: Take 1 tablet (20 mg total) by mouth daily.    Dispense:  90 tablet    Refill:  3  . meloxicam (MOBIC) 15 MG tablet    Sig: Take 1 tablet (15  mg total) by mouth daily as needed. for pain    Dispense:  90 tablet    Refill:  3  . metFORMIN (GLUCOPHAGE) 500 MG tablet    Sig: Take 1 tablet (500 mg total) by mouth daily with breakfast.    Dispense:  90 tablet    Refill:  3  . potassium chloride SA (K-DUR,KLOR-CON) 20 MEQ tablet    Sig: Take 1 tablet (20 mEq total) by mouth daily.    Dispense:  90 tablet    Refill:  3  . tiZANidine (ZANAFLEX) 4 MG tablet    Sig: TAKE 1 TABLET BY MOUTH  EVERY 8 HOURS AS NEEDED FOR MUSCLE SPASM(S)    Dispense:  270 tablet    Refill:  1  . traMADol (ULTRAM) 50 MG tablet    Sig: TAKE 1-2 TABLETS BY MOUTH TWICE DAILY AS NEEDED    Dispense:  100 tablet    Refill:  3  . traZODone (DESYREL) 50 MG tablet    Sig: TAKE 0.5-1 TABLETS BY MOUTH AT BEDTIME AS NEEDED FOR  SLEEP.    Dispense:  90 tablet    Refill:  3     Follow-up: Return in about 4 months (around 03/15/2018) for a follow-up visit.  Walker Kehr, MD

## 2017-11-15 NOTE — Assessment & Plan Note (Signed)
On B12 

## 2017-11-15 NOTE — Assessment & Plan Note (Signed)
Zanaflex Tramadol

## 2017-11-15 NOTE — Assessment & Plan Note (Signed)
Metformin [facility] 

## 2017-11-15 NOTE — Assessment & Plan Note (Signed)
Benazepril 20 mg/d

## 2018-01-03 ENCOUNTER — Telehealth: Payer: Self-pay | Admitting: Internal Medicine

## 2018-01-03 NOTE — Telephone Encounter (Signed)
Copied from [place]. Topic: Quick Communication - See Telephone Encounter >> Jan 03, 2018  3:37 PM Aurelio Brash B wrote: CRM for notification. See Telephone encounter for: 01/03/18.  Chaw from [facility] is asking for clarification  on the pts benazepril (LOTENSIN) .  20mg   -  or   40 mg    contact [phone]  ref [phone]

## 2018-01-03 NOTE — Telephone Encounter (Signed)
Faxed to pharmacy

## 2018-03-18 ENCOUNTER — Ambulatory Visit (INDEPENDENT_AMBULATORY_CARE_PROVIDER_SITE_OTHER): Payer: Medicare Other | Admitting: Internal Medicine

## 2018-03-18 ENCOUNTER — Encounter: Payer: Self-pay | Admitting: Internal Medicine

## 2018-03-18 ENCOUNTER — Other Ambulatory Visit (INDEPENDENT_AMBULATORY_CARE_PROVIDER_SITE_OTHER): Payer: Medicare Other

## 2018-03-18 DIAGNOSIS — E119 Type 2 diabetes mellitus without complications: Secondary | ICD-10-CM

## 2018-03-18 DIAGNOSIS — E559 Vitamin D deficiency, unspecified: Secondary | ICD-10-CM | POA: Diagnosis not present

## 2018-03-18 DIAGNOSIS — M5441 Lumbago with sciatica, right side: Secondary | ICD-10-CM | POA: Diagnosis not present

## 2018-03-18 DIAGNOSIS — M5442 Lumbago with sciatica, left side: Secondary | ICD-10-CM

## 2018-03-18 DIAGNOSIS — N50811 Right testicular pain: Secondary | ICD-10-CM | POA: Diagnosis not present

## 2018-03-18 DIAGNOSIS — E538 Deficiency of other specified B group vitamins: Secondary | ICD-10-CM | POA: Diagnosis not present

## 2018-03-18 DIAGNOSIS — G8929 Other chronic pain: Secondary | ICD-10-CM

## 2018-03-18 LAB — URINALYSIS
Bilirubin Urine: NEGATIVE
Hgb urine dipstick: NEGATIVE
KETONES UR: NEGATIVE
LEUKOCYTES UA: NEGATIVE
Nitrite: NEGATIVE
PH: 6 (ref 5.0–8.0)
Specific Gravity, Urine: 1.01 (ref 1.000–1.030)
TOTAL PROTEIN, URINE-UPE24: NEGATIVE
Urine Glucose: NEGATIVE
Urobilinogen, UA: 1 (ref 0.0–1.0)

## 2018-03-18 MED ORDER — TRAMADOL HCL 50 MG PO TABS: ORAL_TABLET | ORAL | 3 refills | Status: DC

## 2018-03-18 MED ORDER — CIPROFLOXACIN HCL 500 MG PO TABS: 500.0000 mg | ORAL_TABLET | Freq: Two times a day (BID) | ORAL | 0 refills | Status: DC

## 2018-03-18 NOTE — Assessment & Plan Note (Signed)
Tramadol

## 2018-03-18 NOTE — Patient Instructions (Signed)
Support nylon briefs

## 2018-03-18 NOTE — Addendum Note (Signed)
Addended by: Cassandria Anger on: 03/18/2018 09:56 AM   Modules accepted: Orders

## 2018-03-18 NOTE — Assessment & Plan Note (Signed)
On Metformin 

## 2018-03-18 NOTE — Progress Notes (Addendum)
Subjective:  Patient ID: Brian Mccullough, male    DOB: 05-11-1951  Age: 67 y.o. MRN: [id number]  CC: No chief complaint on file.   HPI Brian Mccullough presents for a possible UTI sx's C/o a "pull" in the R groin - testicle F/u DM, OA  Outpatient Medications Prior to Visit  Medication Sig Dispense Refill  . aspirin 81 MG EC tablet Take 81 mg by mouth daily.      . benazepril (LOTENSIN) 20 MG tablet Take 1 tablet (20 mg total) by mouth daily. 90 tablet 3  . Cholecalciferol (VITAMIN D3) 1000 UNITS tablet Take 1,000 Units by mouth daily.     . cyanocobalamin 100 MCG tablet Take 1,000 mcg by mouth daily.     . meloxicam (MOBIC) 15 MG tablet Take 1 tablet (15 mg total) by mouth daily as needed. for pain 90 tablet 3  . metFORMIN (GLUCOPHAGE) 500 MG tablet Take 1 tablet (500 mg total) by mouth daily with breakfast. 90 tablet 3  . metoprolol tartrate (LOPRESSOR) 25 MG tablet Take 1 to 2 tablets by  mouth two times daily as  needed for palpitations 360 tablet 3  . potassium chloride SA (K-DUR,KLOR-CON) 20 MEQ tablet Take 1 tablet (20 mEq total) by mouth daily. 90 tablet 3  . tiZANidine (ZANAFLEX) 4 MG tablet TAKE 1 TABLET BY MOUTH  EVERY 8 HOURS AS NEEDED FOR MUSCLE SPASM(S) 270 tablet 1  . traMADol (ULTRAM) 50 MG tablet TAKE 1-2 TABLETS BY MOUTH TWICE DAILY AS NEEDED 100 tablet 3  . traZODone (DESYREL) 50 MG tablet TAKE 0.5-1 TABLETS BY MOUTH AT BEDTIME AS NEEDED FOR  SLEEP. 90 tablet 3  . triamcinolone ointment (KENALOG) 0.1 % Apply 1 application 2 (two) times daily as needed topically (rash). 80 g 0   No facility-administered medications prior to visit.     ROS: Review of Systems  Constitutional: Negative for appetite change, fatigue and unexpected weight change.  HENT: Negative for congestion, nosebleeds, sneezing, sore throat and trouble swallowing.   Eyes: Negative for itching and visual disturbance.  Respiratory: Negative for cough.   Cardiovascular: Negative for chest pain,  palpitations and leg swelling.  Gastrointestinal: Negative for abdominal distention, blood in stool, diarrhea and nausea.  Genitourinary: Positive for testicular pain. Negative for frequency and hematuria.  Musculoskeletal: Positive for arthralgias. Negative for back pain, gait problem, joint swelling and neck pain.  Skin: Negative for rash.  Neurological: Negative for dizziness, tremors, speech difficulty and weakness.  Psychiatric/Behavioral: Negative for agitation, dysphoric mood and sleep disturbance. The patient is not nervous/anxious.     Objective:  BP 112/68 (BP Location: Left Arm, Patient Position: Sitting, Cuff Size: Large)   Pulse 60   Temp 99 F (37.2 C) (Oral)   Ht 6\' 4"  (1.93 m)   Wt 221 lb (100.2 kg)   SpO2 99%   BMI 26.90 kg/m   BP Readings from Last 3 Encounters:  03/18/18 112/68  11/15/17 116/74  08/14/17 120/82    Wt Readings from Last 3 Encounters:  03/18/18 221 lb (100.2 kg)  11/15/17 224 lb (101.6 kg)  08/14/17 221 lb (100.2 kg)    Physical Exam  Constitutional: He is oriented to person, place, and time. He appears well-developed. No distress.  NAD  HENT:  Mouth/Throat: Oropharynx is clear and moist.  Eyes: Pupils are equal, round, and reactive to light. Conjunctivae are normal.  Neck: Normal range of motion. No JVD present. No thyromegaly present.  Cardiovascular: Normal rate, regular  rhythm, normal heart sounds and intact distal pulses. Exam reveals no gallop and no friction rub.  No murmur heard. Pulmonary/Chest: Effort normal and breath sounds normal. No respiratory distress. He has no wheezes. He has no rales. He exhibits no tenderness.  Abdominal: Soft. Bowel sounds are normal. He exhibits no distension and no mass. There is no tenderness. There is no rebound and no guarding.  Musculoskeletal: Normal range of motion. He exhibits no edema or tenderness.  Lymphadenopathy:    He has no cervical adenopathy.  Neurological: He is alert and oriented  to person, place, and time. He has normal reflexes. No cranial nerve deficit. He exhibits normal muscle tone. He displays a negative Romberg sign. Coordination and gait normal.  Skin: Skin is warm and dry. No rash noted.  Psychiatric: He has a normal mood and affect. His behavior is normal. Judgment and thought content normal.  R testis is sensitive  Lab Results  Component Value Date   WBC 3.9 (L) 08/14/2017   HGB 15.7 08/14/2017   HCT 46.1 08/14/2017   PLT 173.0 08/14/2017   GLUCOSE 82 11/15/2017   CHOL 167 08/14/2017   TRIG 107.0 08/14/2017   HDL 35.70 (L) 08/14/2017   LDLDIRECT 88.1 01/23/2011   LDLCALC 110 (H) 08/14/2017   ALT 4 08/14/2017   AST 8 08/14/2017   NA 137 11/15/2017   K 4.3 11/15/2017   CL 104 11/15/2017   CREATININE 0.92 11/15/2017   BUN 11 11/15/2017   CO2 29 11/15/2017   TSH 0.74 08/14/2017   PSA 0.21 08/14/2017   HGBA1C 5.5 11/15/2017   MICROALBUR <0.7 08/14/2017    No results found.  Assessment & Plan:   There are no diagnoses linked to this encounter.   No orders of the defined types were placed in this encounter.    Follow-up: No follow-ups on file.  Walker Kehr, MD

## 2018-03-18 NOTE — Assessment & Plan Note (Addendum)
?  epididimitis Cipro UA

## 2018-03-18 NOTE — Assessment & Plan Note (Signed)
On B12 

## 2018-03-18 NOTE — Assessment & Plan Note (Signed)
Vit D 

## 2018-05-24 DIAGNOSIS — E119 Type 2 diabetes mellitus without complications: Secondary | ICD-10-CM | POA: Diagnosis not present

## 2018-05-24 LAB — HM DIABETES EYE EXAM

## 2018-06-05 ENCOUNTER — Encounter: Payer: Self-pay | Admitting: Internal Medicine

## 2018-06-05 NOTE — Progress Notes (Signed)
Abstracted and sent to scan  

## 2018-06-21 ENCOUNTER — Other Ambulatory Visit: Payer: Self-pay | Admitting: Internal Medicine

## 2018-08-16 ENCOUNTER — Encounter: Payer: Medicare Other | Admitting: Internal Medicine

## 2018-08-19 ENCOUNTER — Other Ambulatory Visit (INDEPENDENT_AMBULATORY_CARE_PROVIDER_SITE_OTHER): Payer: Medicare Other

## 2018-08-19 ENCOUNTER — Encounter: Payer: Self-pay | Admitting: Internal Medicine

## 2018-08-19 ENCOUNTER — Ambulatory Visit (INDEPENDENT_AMBULATORY_CARE_PROVIDER_SITE_OTHER): Payer: Medicare Other | Admitting: Internal Medicine

## 2018-08-19 DIAGNOSIS — I10 Essential (primary) hypertension: Secondary | ICD-10-CM

## 2018-08-19 DIAGNOSIS — M5442 Lumbago with sciatica, left side: Secondary | ICD-10-CM

## 2018-08-19 DIAGNOSIS — E119 Type 2 diabetes mellitus without complications: Secondary | ICD-10-CM

## 2018-08-19 DIAGNOSIS — M5441 Lumbago with sciatica, right side: Secondary | ICD-10-CM | POA: Diagnosis not present

## 2018-08-19 DIAGNOSIS — E538 Deficiency of other specified B group vitamins: Secondary | ICD-10-CM | POA: Diagnosis not present

## 2018-08-19 DIAGNOSIS — R002 Palpitations: Secondary | ICD-10-CM | POA: Diagnosis not present

## 2018-08-19 DIAGNOSIS — G8929 Other chronic pain: Secondary | ICD-10-CM

## 2018-08-19 LAB — BASIC METABOLIC PANEL
BUN: 10 mg/dL (ref 6–23)
CALCIUM: 9.3 mg/dL (ref 8.4–10.5)
CHLORIDE: 106 meq/L (ref 96–112)
CO2: 30 mEq/L (ref 19–32)
CREATININE: 0.98 mg/dL (ref 0.40–1.50)
GFR: 98.27 mL/min (ref >60.00)
Glucose, Bld: 89 mg/dL (ref 70–99)
Potassium: 4.8 mEq/L (ref 3.5–5.1)
Sodium: 140 mEq/L (ref 135–145)

## 2018-08-19 LAB — HEMOGLOBIN A1C: HEMOGLOBIN A1C: 5.4 % (ref 4.6–6.5)

## 2018-08-19 MED ORDER — TRAMADOL HCL 50 MG PO TABS: ORAL_TABLET | ORAL | 3 refills | Status: DC

## 2018-08-19 NOTE — Assessment & Plan Note (Signed)
Tramadol, Zanaflex prn  Potential benefits of a long term opioids use as well as potential risks (i.e. addiction risk, apnea etc) and complications (i.e. Somnolence, constipation and others) were explained to the patient and were aknowledged. 

## 2018-08-19 NOTE — Assessment & Plan Note (Signed)
B 12

## 2018-08-19 NOTE — Assessment & Plan Note (Addendum)
Amlodipine and Benazepril CT ca scoring info

## 2018-08-19 NOTE — Patient Instructions (Signed)

## 2018-08-19 NOTE — Progress Notes (Signed)
Subjective:  Patient ID: Brian Mccullough, male    DOB: 1950/12/28  Age: 67 y.o. MRN: [id number]  CC: No chief complaint on file.   HPI Brian Mccullough presents for DM, HTN, insomnia f/u  Outpatient Medications Prior to Visit  Medication Sig Dispense Refill  . aspirin 81 MG EC tablet Take 81 mg by mouth daily.      . benazepril (LOTENSIN) 20 MG tablet Take 1 tablet (20 mg total) by mouth daily. 90 tablet 3  . Cholecalciferol (VITAMIN D3) 1000 UNITS tablet Take 1,000 Units by mouth daily.     . ciprofloxacin (CIPRO) 500 MG tablet Take 1 tablet (500 mg total) by mouth 2 (two) times daily. 20 tablet 0  . cyanocobalamin 100 MCG tablet Take 1,000 mcg by mouth daily.     . meloxicam (MOBIC) 15 MG tablet Take 1 tablet (15 mg total) by mouth daily as needed. for pain 90 tablet 3  . metFORMIN (GLUCOPHAGE) 500 MG tablet Take 1 tablet (500 mg total) by mouth daily with breakfast. 90 tablet 3  . metoprolol tartrate (LOPRESSOR) 25 MG tablet Take 1 to 2 tablets by  mouth two times daily as  needed for palpitations 360 tablet 3  . potassium chloride SA (K-DUR,KLOR-CON) 20 MEQ tablet Take 1 tablet (20 mEq total) by mouth daily. 90 tablet 3  . tiZANidine (ZANAFLEX) 4 MG tablet TAKE 1 TABLET BY MOUTH  EVERY 8 HOURS AS NEEDED FOR MUSCLE SPASM(S) 270 tablet 1  . traMADol (ULTRAM) 50 MG tablet TAKE 1-2 TABLETS BY MOUTH TWICE DAILY AS NEEDED 100 tablet 3  . traZODone (DESYREL) 50 MG tablet TAKE 0.5-1 TABLETS BY MOUTH AT BEDTIME AS NEEDED FOR  SLEEP. 90 tablet 3  . triamcinolone ointment (KENALOG) 0.1 % Apply 1 application 2 (two) times daily as needed topically (rash). 80 g 0   No facility-administered medications prior to visit.     ROS: Review of Systems  Constitutional: Negative for appetite change, fatigue and unexpected weight change.  HENT: Negative for congestion, nosebleeds, sneezing, sore throat and trouble swallowing.   Eyes: Negative for itching and visual disturbance.  Respiratory: Negative  for cough.   Cardiovascular: Negative for chest pain, palpitations and leg swelling.  Gastrointestinal: Negative for abdominal distention, blood in stool, diarrhea and nausea.  Genitourinary: Negative for frequency and hematuria.  Musculoskeletal: Negative for back pain, gait problem, joint swelling and neck pain.  Skin: Negative for rash.  Neurological: Negative for dizziness, tremors, speech difficulty and weakness.  Psychiatric/Behavioral: Positive for sleep disturbance. Negative for agitation, dysphoric mood and suicidal ideas. The patient is not nervous/anxious.     Objective:  BP 118/82 (BP Location: Left Arm, Patient Position: Sitting, Cuff Size: Normal)   Pulse (!) 46   Temp 98.9 F (37.2 C) (Oral)   Ht 6\' 4"  (1.93 m)   Wt 220 lb (99.8 kg)   SpO2 99%   BMI 26.78 kg/m   BP Readings from Last 3 Encounters:  08/19/18 118/82  03/18/18 112/68  11/15/17 116/74    Wt Readings from Last 3 Encounters:  08/19/18 220 lb (99.8 kg)  03/18/18 221 lb (100.2 kg)  11/15/17 224 lb (101.6 kg)    Physical Exam  Constitutional: He is oriented to person, place, and time. He appears well-developed. No distress.  NAD  HENT:  Mouth/Throat: Oropharynx is clear and moist.  Eyes: Pupils are equal, round, and reactive to light. Conjunctivae are normal.  Neck: Normal range of motion. No JVD present.  No thyromegaly present.  Cardiovascular: Normal rate, regular rhythm, normal heart sounds and intact distal pulses. Exam reveals no gallop and no friction rub.  No murmur heard. Pulmonary/Chest: Effort normal and breath sounds normal. No respiratory distress. He has no wheezes. He has no rales. He exhibits no tenderness.  Abdominal: Soft. Bowel sounds are normal. He exhibits no distension and no mass. There is no tenderness. There is no rebound and no guarding.  Musculoskeletal: Normal range of motion. He exhibits no edema or tenderness.  Lymphadenopathy:    He has no cervical adenopathy.    Neurological: He is alert and oriented to person, place, and time. He has normal reflexes. No cranial nerve deficit. He exhibits normal muscle tone. He displays a negative Romberg sign. Coordination and gait normal.  Skin: Skin is warm and dry. No rash noted.  Psychiatric: He has a normal mood and affect. His behavior is normal. Judgment and thought content normal.    Lab Results  Component Value Date   WBC 3.9 (L) 08/14/2017   HGB 15.7 08/14/2017   HCT 46.1 08/14/2017   PLT 173.0 08/14/2017   GLUCOSE 82 11/15/2017   CHOL 167 08/14/2017   TRIG 107.0 08/14/2017   HDL 35.70 (L) 08/14/2017   LDLDIRECT 88.1 01/23/2011   LDLCALC 110 (H) 08/14/2017   ALT 4 08/14/2017   AST 8 08/14/2017   NA 137 11/15/2017   K 4.3 11/15/2017   CL 104 11/15/2017   CREATININE 0.92 11/15/2017   BUN 11 11/15/2017   CO2 29 11/15/2017   TSH 0.74 08/14/2017   PSA 0.21 08/14/2017   HGBA1C 5.5 11/15/2017   MICROALBUR <0.7 08/14/2017    No results found.  Assessment & Plan:   There are no diagnoses linked to this encounter.   No orders of the defined types were placed in this encounter.    Follow-up: No follow-ups on file.  Walker Kehr, MD

## 2018-08-19 NOTE — Assessment & Plan Note (Signed)
Toprol prn

## 2018-12-16 ENCOUNTER — Ambulatory Visit: Payer: Medicare Other | Admitting: Internal Medicine

## 2018-12-16 ENCOUNTER — Other Ambulatory Visit: Payer: Self-pay

## 2018-12-16 MED ORDER — TIZANIDINE HCL 4 MG PO TABS: ORAL_TABLET | ORAL | 0 refills | Status: DC

## 2018-12-16 MED ORDER — MELOXICAM 15 MG PO TABS: 15.0000 mg | ORAL_TABLET | Freq: Every day | ORAL | 3 refills | Status: DC | PRN

## 2018-12-16 MED ORDER — METFORMIN HCL 500 MG PO TABS: 500.0000 mg | ORAL_TABLET | Freq: Every day | ORAL | 3 refills | Status: DC

## 2018-12-16 MED ORDER — BENAZEPRIL HCL 20 MG PO TABS: 20.0000 mg | ORAL_TABLET | Freq: Every day | ORAL | 3 refills | Status: DC

## 2018-12-16 MED ORDER — METOPROLOL TARTRATE 25 MG PO TABS: ORAL_TABLET | ORAL | 3 refills | Status: DC

## 2018-12-16 MED ORDER — POTASSIUM CHLORIDE CRYS ER 20 MEQ PO TBCR: 20.0000 meq | EXTENDED_RELEASE_TABLET | Freq: Every day | ORAL | 3 refills | Status: DC

## 2018-12-16 MED ORDER — TRAZODONE HCL 50 MG PO TABS: ORAL_TABLET | ORAL | 3 refills | Status: DC

## 2018-12-20 ENCOUNTER — Encounter: Payer: Self-pay | Admitting: Internal Medicine

## 2018-12-20 ENCOUNTER — Other Ambulatory Visit (INDEPENDENT_AMBULATORY_CARE_PROVIDER_SITE_OTHER): Payer: Medicare Other

## 2018-12-20 ENCOUNTER — Other Ambulatory Visit: Payer: Self-pay

## 2018-12-20 ENCOUNTER — Ambulatory Visit (INDEPENDENT_AMBULATORY_CARE_PROVIDER_SITE_OTHER): Payer: Medicare Other | Admitting: Internal Medicine

## 2018-12-20 DIAGNOSIS — M5441 Lumbago with sciatica, right side: Secondary | ICD-10-CM

## 2018-12-20 DIAGNOSIS — I10 Essential (primary) hypertension: Secondary | ICD-10-CM

## 2018-12-20 DIAGNOSIS — M5442 Lumbago with sciatica, left side: Secondary | ICD-10-CM

## 2018-12-20 DIAGNOSIS — E291 Testicular hypofunction: Secondary | ICD-10-CM | POA: Diagnosis not present

## 2018-12-20 DIAGNOSIS — E538 Deficiency of other specified B group vitamins: Secondary | ICD-10-CM

## 2018-12-20 DIAGNOSIS — E119 Type 2 diabetes mellitus without complications: Secondary | ICD-10-CM

## 2018-12-20 DIAGNOSIS — G8929 Other chronic pain: Secondary | ICD-10-CM

## 2018-12-20 DIAGNOSIS — R002 Palpitations: Secondary | ICD-10-CM

## 2018-12-20 DIAGNOSIS — E559 Vitamin D deficiency, unspecified: Secondary | ICD-10-CM

## 2018-12-20 LAB — BASIC METABOLIC PANEL
BUN: 10 mg/dL (ref 6–23)
CO2: 27 meq/L (ref 19–32)
Calcium: 9.2 mg/dL (ref 8.4–10.5)
Chloride: 104 mEq/L (ref 96–112)
Creatinine, Ser: 1.02 mg/dL (ref 0.40–1.50)
GFR: 88.19 mL/min (ref >60.00)
GLUCOSE: 82 mg/dL (ref 70–99)
Potassium: 3.9 mEq/L (ref 3.5–5.1)
Sodium: 138 mEq/L (ref 135–145)

## 2018-12-20 LAB — HEMOGLOBIN A1C: Hgb A1c MFr Bld: 5.4 % (ref 4.6–6.5)

## 2018-12-20 MED ORDER — TRAMADOL HCL 50 MG PO TABS: ORAL_TABLET | ORAL | 3 refills | Status: DC

## 2018-12-20 NOTE — Assessment & Plan Note (Signed)
Tramadol, Zanaflex prn  Potential benefits of a long term opioids use as well as potential risks (i.e. addiction risk, apnea etc) and complications (i.e. Somnolence, constipation and others) were explained to the patient and were aknowledged. 

## 2018-12-20 NOTE — Assessment & Plan Note (Signed)
Vit D 

## 2018-12-20 NOTE — Assessment & Plan Note (Signed)
Potential benefits of a long term testosterone use as well as potential risks  and complications were explained to the patient and were aknowledged.

## 2018-12-20 NOTE — Assessment & Plan Note (Signed)
On Metoprolol prn

## 2018-12-20 NOTE — Progress Notes (Signed)
Subjective:  Patient ID: Brian Mccullough, male    DOB: May 05, 1951  Age: 68 y.o. MRN: [id number]  CC: No chief complaint on file.   HPI Brian Mccullough presents for DM, HTN, Vit B12 def  Outpatient Medications Prior to Visit  Medication Sig Dispense Refill  . aspirin 81 MG EC tablet Take 81 mg by mouth daily.      . benazepril (LOTENSIN) 20 MG tablet Take 1 tablet (20 mg total) by mouth daily. 90 tablet 3  . Cholecalciferol (VITAMIN D3) 1000 UNITS tablet Take 1,000 Units by mouth daily.     . ciprofloxacin (CIPRO) 500 MG tablet Take 1 tablet (500 mg total) by mouth 2 (two) times daily. 20 tablet 0  . cyanocobalamin 100 MCG tablet Take 1,000 mcg by mouth daily.     . meloxicam (MOBIC) 15 MG tablet Take 1 tablet (15 mg total) by mouth daily as needed. for pain 90 tablet 3  . metFORMIN (GLUCOPHAGE) 500 MG tablet Take 1 tablet (500 mg total) by mouth daily with breakfast. 90 tablet 3  . metoprolol tartrate (LOPRESSOR) 25 MG tablet Take 1 to 2 tablets by  mouth two times daily as  needed for palpitations 360 tablet 3  . potassium chloride SA (K-DUR,KLOR-CON) 20 MEQ tablet Take 1 tablet (20 mEq total) by mouth daily. 90 tablet 3  . tiZANidine (ZANAFLEX) 4 MG tablet TAKE 1 TABLET BY MOUTH  EVERY 8 HOURS AS NEEDED FOR MUSCLE SPASM(S) 270 tablet 0  . traZODone (DESYREL) 50 MG tablet TAKE 0.5-1 TABLETS BY MOUTH AT BEDTIME AS NEEDED FOR  SLEEP. 90 tablet 3  . triamcinolone ointment (KENALOG) 0.1 % Apply 1 application 2 (two) times daily as needed topically (rash). 80 g 0  . traMADol (ULTRAM) 50 MG tablet TAKE 1-2 TABLETS BY MOUTH TWICE DAILY AS NEEDED 100 tablet 3   No facility-administered medications prior to visit.     ROS: Review of Systems  Constitutional: Negative for appetite change, fatigue and unexpected weight change.  HENT: Negative for congestion, nosebleeds, sneezing, sore throat and trouble swallowing.   Eyes: Negative for itching and visual disturbance.  Respiratory: Negative  for cough.   Cardiovascular: Negative for chest pain, palpitations and leg swelling.  Gastrointestinal: Negative for abdominal distention, blood in stool, diarrhea and nausea.  Genitourinary: Negative for frequency and hematuria.  Musculoskeletal: Negative for back pain, gait problem, joint swelling and neck pain.  Skin: Negative for rash.  Neurological: Negative for dizziness, tremors, speech difficulty and weakness.  Psychiatric/Behavioral: Negative for agitation, dysphoric mood, sleep disturbance and suicidal ideas. The patient is not nervous/anxious.     Objective:  BP 124/80 (BP Location: Left Arm, Patient Position: Sitting, Cuff Size: Normal)   Pulse 61   Temp 98.6 F (37 C) (Oral)   Ht 6\' 4"  (1.93 m)   Wt 228 lb (103.4 kg)   SpO2 99%   BMI 27.75 kg/m   BP Readings from Last 3 Encounters:  12/20/18 124/80  08/19/18 118/82  03/18/18 112/68    Wt Readings from Last 3 Encounters:  12/20/18 228 lb (103.4 kg)  08/19/18 220 lb (99.8 kg)  03/18/18 221 lb (100.2 kg)    Physical Exam Constitutional:      General: He is not in acute distress.    Appearance: He is well-developed.     Comments: NAD  Eyes:     Conjunctiva/sclera: Conjunctivae normal.     Pupils: Pupils are equal, round, and reactive to light.  Neck:  Musculoskeletal: Normal range of motion.     Thyroid: No thyromegaly.     Vascular: No JVD.  Cardiovascular:     Rate and Rhythm: Normal rate and regular rhythm.     Heart sounds: Normal heart sounds. No murmur. No friction rub. No gallop.   Pulmonary:     Effort: Pulmonary effort is normal. No respiratory distress.     Breath sounds: Normal breath sounds. No wheezing or rales.  Chest:     Chest wall: No tenderness.  Abdominal:     General: Bowel sounds are normal. There is no distension.     Palpations: Abdomen is soft. There is no mass.     Tenderness: There is no abdominal tenderness. There is no guarding or rebound.  Musculoskeletal: Normal range  of motion.        General: No tenderness.  Lymphadenopathy:     Cervical: No cervical adenopathy.  Skin:    General: Skin is warm and dry.     Findings: No rash.  Neurological:     Mental Status: He is alert and oriented to person, place, and time.     Cranial Nerves: No cranial nerve deficit.     Motor: No abnormal muscle tone.     Coordination: Coordination normal.     Gait: Gait normal.     Deep Tendon Reflexes: Reflexes are normal and symmetric.  Psychiatric:        Behavior: Behavior normal.        Thought Content: Thought content normal.        Judgment: Judgment normal.     Lab Results  Component Value Date   WBC 3.9 (L) 08/14/2017   HGB 15.7 08/14/2017   HCT 46.1 08/14/2017   PLT 173.0 08/14/2017   GLUCOSE 89 08/19/2018   CHOL 167 08/14/2017   TRIG 107.0 08/14/2017   HDL 35.70 (L) 08/14/2017   LDLDIRECT 88.1 01/23/2011   LDLCALC 110 (H) 08/14/2017   ALT 4 08/14/2017   AST 8 08/14/2017   NA 140 08/19/2018   K 4.8 08/19/2018   CL 106 08/19/2018   CREATININE 0.98 08/19/2018   BUN 10 08/19/2018   CO2 30 08/19/2018   TSH 0.74 08/14/2017   PSA 0.21 08/14/2017   HGBA1C 5.4 08/19/2018   MICROALBUR <0.7 08/14/2017    No results found.  Assessment & Plan:   Diagnoses and all orders for this visit:  Essential hypertension  Hypogonadism male  B12 deficiency  Chronic bilateral low back pain with bilateral sciatica  Controlled type 2 diabetes mellitus without complication, without long-term current use of insulin (HCC)  Vitamin D deficiency  Other orders -     traMADol (ULTRAM) 50 MG tablet; TAKE 1-2 TABLETS BY MOUTH TWICE DAILY AS NEEDED     Meds ordered this encounter  Medications  . traMADol (ULTRAM) 50 MG tablet    Sig: TAKE 1-2 TABLETS BY MOUTH TWICE DAILY AS NEEDED    Dispense:  100 tablet    Refill:  3     Follow-up: No follow-ups on file.  Walker Kehr, MD

## 2018-12-20 NOTE — Assessment & Plan Note (Signed)
On B12 

## 2018-12-20 NOTE — Assessment & Plan Note (Signed)
Metformin 

## 2018-12-20 NOTE — Assessment & Plan Note (Signed)
CT ca scoring info given 2019

## 2019-02-25 ENCOUNTER — Other Ambulatory Visit: Payer: Self-pay | Admitting: Internal Medicine

## 2019-04-21 ENCOUNTER — Other Ambulatory Visit (INDEPENDENT_AMBULATORY_CARE_PROVIDER_SITE_OTHER): Payer: Medicare Other

## 2019-04-21 ENCOUNTER — Encounter: Payer: Self-pay | Admitting: Internal Medicine

## 2019-04-21 ENCOUNTER — Ambulatory Visit (INDEPENDENT_AMBULATORY_CARE_PROVIDER_SITE_OTHER): Payer: Medicare Other | Admitting: Internal Medicine

## 2019-04-21 ENCOUNTER — Other Ambulatory Visit: Payer: Self-pay

## 2019-04-21 DIAGNOSIS — G8929 Other chronic pain: Secondary | ICD-10-CM | POA: Diagnosis not present

## 2019-04-21 DIAGNOSIS — E119 Type 2 diabetes mellitus without complications: Secondary | ICD-10-CM | POA: Diagnosis not present

## 2019-04-21 DIAGNOSIS — I10 Essential (primary) hypertension: Secondary | ICD-10-CM

## 2019-04-21 DIAGNOSIS — R1032 Left lower quadrant pain: Secondary | ICD-10-CM

## 2019-04-21 DIAGNOSIS — E538 Deficiency of other specified B group vitamins: Secondary | ICD-10-CM

## 2019-04-21 LAB — BASIC METABOLIC PANEL
BUN: 14 mg/dL (ref 6–23)
CO2: 25 mEq/L (ref 19–32)
Calcium: 9.3 mg/dL (ref 8.4–10.5)
Chloride: 107 mEq/L (ref 96–112)
Creatinine, Ser: 0.98 mg/dL (ref 0.40–1.50)
GFR: 92.27 mL/min (ref >60.00)
Glucose, Bld: 87 mg/dL (ref 70–99)
Potassium: 4.5 mEq/L (ref 3.5–5.1)
Sodium: 140 mEq/L (ref 135–145)

## 2019-04-21 LAB — HEMOGLOBIN A1C: Hgb A1c MFr Bld: 5.5 % (ref 4.6–6.5)

## 2019-04-21 NOTE — Assessment & Plan Note (Signed)
Labs

## 2019-04-21 NOTE — Assessment & Plan Note (Signed)
B 12

## 2019-04-21 NOTE — Assessment & Plan Note (Addendum)
Exam ok - will watch CT abd if worse

## 2019-04-21 NOTE — Assessment & Plan Note (Signed)
Metformin [facility] 

## 2019-04-21 NOTE — Patient Instructions (Signed)
If you have medicare related insurance (such as traditional Medicare, Blue Cross Medicare, [employer] Medicare, or similar), Please make an appointment at the scheduling desk with Jill, the Wellness Health Coach, for your Wellness visit in this office, which is a benefit with your insurance.  

## 2019-04-21 NOTE — Progress Notes (Signed)
Subjective:  Patient ID: Brian Mccullough, male    DOB: [birth year]  Age: 68 y.o. MRN: [id number]  CC: No chief complaint on file.   HPI Brian Mccullough presents for HTN, OA, insomnia f/u C/o chronic L groin pain - mild and sporadic x long time  Outpatient Medications Prior to Visit  Medication Sig Dispense Refill  . aspirin 81 MG EC tablet Take 81 mg by mouth daily.      . benazepril (LOTENSIN) 20 MG tablet Take 1 tablet (20 mg total) by mouth daily. 90 tablet 3  . Cholecalciferol (VITAMIN D3) 1000 UNITS tablet Take 1,000 Units by mouth daily.     . ciprofloxacin (CIPRO) 500 MG tablet Take 1 tablet (500 mg total) by mouth 2 (two) times daily. 20 tablet 0  . cyanocobalamin 100 MCG tablet Take 1,000 mcg by mouth daily.     . meloxicam (MOBIC) 15 MG tablet Take 1 tablet (15 mg total) by mouth daily as needed. for pain 90 tablet 3  . metFORMIN (GLUCOPHAGE) 500 MG tablet Take 1 tablet (500 mg total) by mouth daily with breakfast. 90 tablet 3  . metoprolol tartrate (LOPRESSOR) 25 MG tablet Take 1 to 2 tablets by  mouth two times daily as  needed for palpitations 360 tablet 3  . potassium chloride SA (K-DUR,KLOR-CON) 20 MEQ tablet Take 1 tablet (20 mEq total) by mouth daily. 90 tablet 3  . tiZANidine (ZANAFLEX) 4 MG tablet TAKE 1 TABLET BY MOUTH  EVERY 8 HOURS AS NEEDED FOR MUSCLE SPASM(S) 270 tablet 0  . traMADol (ULTRAM) 50 MG tablet TAKE 1 TO 2 TABLETS BY MOUTH TWICE DAILY AS NEEDED 100 tablet 3  . traZODone (DESYREL) 50 MG tablet TAKE 0.5-1 TABLETS BY MOUTH AT BEDTIME AS NEEDED FOR  SLEEP. 90 tablet 3  . triamcinolone ointment (KENALOG) 0.1 % Apply 1 application 2 (two) times daily as needed topically (rash). 80 g 0   No facility-administered medications prior to visit.     ROS: Review of Systems  Constitutional: Negative for appetite change, fatigue and unexpected weight change.  HENT: Negative for congestion, nosebleeds, sneezing, sore throat and trouble swallowing.   Eyes:  Negative for itching and visual disturbance.  Respiratory: Negative for cough.   Cardiovascular: Negative for chest pain, palpitations and leg swelling.  Gastrointestinal: Negative for abdominal distention, blood in stool, diarrhea and nausea.  Genitourinary: Negative for frequency and hematuria.  Musculoskeletal: Positive for arthralgias. Negative for back pain, gait problem, joint swelling and neck pain.  Skin: Negative for rash.  Neurological: Negative for dizziness, tremors, speech difficulty and weakness.  Psychiatric/Behavioral: Negative for agitation, dysphoric mood, sleep disturbance and suicidal ideas. The patient is not nervous/anxious.     Objective:  BP 122/72 (BP Location: Left Arm, Patient Position: Sitting, Cuff Size: Large)   Pulse (!) 45   Temp 98.3 F (36.8 C) (Oral)   Ht 6\' 4"  (1.93 m)   Wt 215 lb (97.5 kg)   SpO2 98%   BMI 26.17 kg/m   BP Readings from Last 3 Encounters:  04/21/19 122/72  12/20/18 124/80  08/19/18 118/82    Wt Readings from Last 3 Encounters:  04/21/19 215 lb (97.5 kg)  12/20/18 228 lb (103.4 kg)  08/19/18 220 lb (99.8 kg)    Physical Exam Constitutional:      General: He is not in acute distress.    Appearance: He is well-developed.     Comments: NAD  Eyes:     Conjunctiva/sclera:  Conjunctivae normal.     Pupils: Pupils are equal, round, and reactive to light.  Neck:     Musculoskeletal: Normal range of motion.     Thyroid: No thyromegaly.     Vascular: No JVD.  Cardiovascular:     Rate and Rhythm: Normal rate and regular rhythm.     Heart sounds: Normal heart sounds. No murmur. No friction rub. No gallop.   Pulmonary:     Effort: Pulmonary effort is normal. No respiratory distress.     Breath sounds: Normal breath sounds. No wheezing or rales.  Chest:     Chest wall: No tenderness.  Abdominal:     General: Bowel sounds are normal. There is no distension.     Palpations: Abdomen is soft. There is no mass.     Tenderness:  There is no abdominal tenderness. There is no guarding or rebound.  Musculoskeletal: Normal range of motion.        General: No tenderness.  Lymphadenopathy:     Cervical: No cervical adenopathy.  Skin:    General: Skin is warm and dry.     Findings: No rash.  Neurological:     Mental Status: He is alert and oriented to person, place, and time.     Cranial Nerves: No cranial nerve deficit.     Motor: No abnormal muscle tone.     Coordination: Coordination normal.     Gait: Gait normal.     Deep Tendon Reflexes: Reflexes are normal and symmetric.  Psychiatric:        Behavior: Behavior normal.        Thought Content: Thought content normal.        Judgment: Judgment normal.   L testis absent No hernia R testis OK  Lab Results  Component Value Date   WBC 3.9 (L) 08/14/2017   HGB 15.7 08/14/2017   HCT 46.1 08/14/2017   PLT 173.0 08/14/2017   GLUCOSE 82 12/20/2018   CHOL 167 08/14/2017   TRIG 107.0 08/14/2017   HDL 35.70 (L) 08/14/2017   LDLDIRECT 88.1 01/23/2011   LDLCALC 110 (H) 08/14/2017   ALT 4 08/14/2017   AST 8 08/14/2017   NA 138 12/20/2018   K 3.9 12/20/2018   CL 104 12/20/2018   CREATININE 1.02 12/20/2018   BUN 10 12/20/2018   CO2 27 12/20/2018   TSH 0.74 08/14/2017   PSA 0.21 08/14/2017   HGBA1C 5.4 12/20/2018   MICROALBUR <0.7 08/14/2017    No results found.  Assessment & Plan:   There are no diagnoses linked to this encounter.   No orders of the defined types were placed in this encounter.    Follow-up: No follow-ups on file.  Walker Kehr, MD

## 2019-08-25 ENCOUNTER — Other Ambulatory Visit: Payer: Self-pay

## 2019-08-25 ENCOUNTER — Encounter: Payer: Self-pay | Admitting: Internal Medicine

## 2019-08-25 ENCOUNTER — Ambulatory Visit (INDEPENDENT_AMBULATORY_CARE_PROVIDER_SITE_OTHER): Payer: Medicare Other | Admitting: Internal Medicine

## 2019-08-25 VITALS — BP 126/74 | HR 45 | Temp 98.3°F | Ht 76.0 in | Wt 215.0 lb

## 2019-08-25 DIAGNOSIS — M5441 Lumbago with sciatica, right side: Secondary | ICD-10-CM | POA: Diagnosis not present

## 2019-08-25 DIAGNOSIS — E559 Vitamin D deficiency, unspecified: Secondary | ICD-10-CM | POA: Diagnosis not present

## 2019-08-25 DIAGNOSIS — E538 Deficiency of other specified B group vitamins: Secondary | ICD-10-CM | POA: Diagnosis not present

## 2019-08-25 DIAGNOSIS — E119 Type 2 diabetes mellitus without complications: Secondary | ICD-10-CM

## 2019-08-25 DIAGNOSIS — G8929 Other chronic pain: Secondary | ICD-10-CM

## 2019-08-25 DIAGNOSIS — Z0000 Encounter for general adult medical examination without abnormal findings: Secondary | ICD-10-CM

## 2019-08-25 DIAGNOSIS — M5442 Lumbago with sciatica, left side: Secondary | ICD-10-CM

## 2019-08-25 MED ORDER — TRAZODONE HCL 50 MG PO TABS: ORAL_TABLET | ORAL | 3 refills | Status: DC

## 2019-08-25 MED ORDER — METOPROLOL TARTRATE 25 MG PO TABS: ORAL_TABLET | ORAL | 3 refills | Status: DC

## 2019-08-25 MED ORDER — METFORMIN HCL 500 MG PO TABS: 500.0000 mg | ORAL_TABLET | Freq: Every day | ORAL | 3 refills | Status: DC

## 2019-08-25 MED ORDER — BENAZEPRIL HCL 20 MG PO TABS: 20.0000 mg | ORAL_TABLET | Freq: Every day | ORAL | 3 refills | Status: DC

## 2019-08-25 MED ORDER — TRAMADOL HCL 50 MG PO TABS: ORAL_TABLET | ORAL | 3 refills | Status: DC

## 2019-08-25 MED ORDER — POTASSIUM CHLORIDE CRYS ER 20 MEQ PO TBCR: 20.0000 meq | EXTENDED_RELEASE_TABLET | Freq: Every day | ORAL | 3 refills | Status: DC

## 2019-08-25 MED ORDER — TRIAMCINOLONE ACETONIDE 0.1 % EX OINT: 1.0000 "application " | TOPICAL_OINTMENT | Freq: Two times a day (BID) | CUTANEOUS | 0 refills | Status: DC | PRN

## 2019-08-25 MED ORDER — MELOXICAM 15 MG PO TABS: 15.0000 mg | ORAL_TABLET | Freq: Every day | ORAL | 3 refills | Status: DC | PRN

## 2019-08-25 MED ORDER — TIZANIDINE HCL 4 MG PO TABS: ORAL_TABLET | ORAL | 0 refills | Status: DC

## 2019-08-25 NOTE — Assessment & Plan Note (Signed)
Metformin 

## 2019-08-25 NOTE — Progress Notes (Signed)
Subjective:  Patient ID: Brian Mccullough, male    DOB: 17-Nov-1950  Age: 68 y.o. MRN: [id number]  CC: No chief complaint on file.   HPI DARRAN FRANKLIN presents for DM, HTN, OA f/u  Outpatient Medications Prior to Visit  Medication Sig Dispense Refill  . aspirin 81 MG EC tablet Take 81 mg by mouth daily.      . benazepril (LOTENSIN) 20 MG tablet Take 1 tablet (20 mg total) by mouth daily. 90 tablet 3  . Cholecalciferol (VITAMIN D3) 1000 UNITS tablet Take 1,000 Units by mouth daily.     . ciprofloxacin (CIPRO) 500 MG tablet Take 1 tablet (500 mg total) by mouth 2 (two) times daily. 20 tablet 0  . cyanocobalamin 100 MCG tablet Take 1,000 mcg by mouth daily.     . meloxicam (MOBIC) 15 MG tablet Take 1 tablet (15 mg total) by mouth daily as needed. for pain 90 tablet 3  . metFORMIN (GLUCOPHAGE) 500 MG tablet Take 1 tablet (500 mg total) by mouth daily with breakfast. 90 tablet 3  . metoprolol tartrate (LOPRESSOR) 25 MG tablet Take 1 to 2 tablets by  mouth two times daily as  needed for palpitations 360 tablet 3  . potassium chloride SA (K-DUR,KLOR-CON) 20 MEQ tablet Take 1 tablet (20 mEq total) by mouth daily. 90 tablet 3  . tiZANidine (ZANAFLEX) 4 MG tablet TAKE 1 TABLET BY MOUTH  EVERY 8 HOURS AS NEEDED FOR MUSCLE SPASM(S) 270 tablet 0  . traMADol (ULTRAM) 50 MG tablet TAKE 1 TO 2 TABLETS BY MOUTH TWICE DAILY AS NEEDED 100 tablet 3  . traZODone (DESYREL) 50 MG tablet TAKE 0.5-1 TABLETS BY MOUTH AT BEDTIME AS NEEDED FOR  SLEEP. 90 tablet 3  . triamcinolone ointment (KENALOG) 0.1 % Apply 1 application 2 (two) times daily as needed topically (rash). 80 g 0   No facility-administered medications prior to visit.     ROS: Review of Systems  Constitutional: Negative for appetite change, fatigue and unexpected weight change.  HENT: Negative for congestion, nosebleeds, sneezing, sore throat and trouble swallowing.   Eyes: Negative for itching and visual disturbance.  Respiratory: Negative  for cough.   Cardiovascular: Negative for chest pain, palpitations and leg swelling.  Gastrointestinal: Negative for abdominal distention, blood in stool, diarrhea and nausea.  Genitourinary: Negative for frequency and hematuria.  Musculoskeletal: Positive for arthralgias and gait problem. Negative for back pain, joint swelling and neck pain.  Skin: Negative for rash.  Neurological: Negative for dizziness, tremors, speech difficulty and weakness.  Psychiatric/Behavioral: Negative for agitation, dysphoric mood, sleep disturbance and suicidal ideas. The patient is not nervous/anxious.     Objective:  BP 126/74 (BP Location: Left Arm, Patient Position: Sitting, Cuff Size: Normal)   Pulse (!) 45   Temp 98.3 F (36.8 C) (Oral)   Ht 6\' 4"  (1.93 m)   Wt 215 lb (97.5 kg)   SpO2 99%   BMI 26.17 kg/m   BP Readings from Last 3 Encounters:  08/25/19 126/74  04/21/19 122/72  12/20/18 124/80    Wt Readings from Last 3 Encounters:  08/25/19 215 lb (97.5 kg)  04/21/19 215 lb (97.5 kg)  12/20/18 228 lb (103.4 kg)    Physical Exam Constitutional:      General: He is not in acute distress.    Appearance: He is well-developed.     Comments: NAD  Eyes:     Conjunctiva/sclera: Conjunctivae normal.     Pupils: Pupils are equal, round,  and reactive to light.  Neck:     Musculoskeletal: Normal range of motion.     Thyroid: No thyromegaly.     Vascular: No JVD.  Cardiovascular:     Rate and Rhythm: Normal rate and regular rhythm.     Heart sounds: Normal heart sounds. No murmur. No friction rub. No gallop.   Pulmonary:     Effort: Pulmonary effort is normal. No respiratory distress.     Breath sounds: Normal breath sounds. No wheezing or rales.  Chest:     Chest wall: No tenderness.  Abdominal:     General: Bowel sounds are normal. There is no distension.     Palpations: Abdomen is soft. There is no mass.     Tenderness: There is no abdominal tenderness. There is no guarding or rebound.   Musculoskeletal: Normal range of motion.        General: No tenderness.  Lymphadenopathy:     Cervical: No cervical adenopathy.  Skin:    General: Skin is warm and dry.     Findings: No rash.  Neurological:     Mental Status: He is alert and oriented to person, place, and time.     Cranial Nerves: No cranial nerve deficit.     Motor: No abnormal muscle tone.     Coordination: Coordination normal.     Gait: Gait normal.     Deep Tendon Reflexes: Reflexes are normal and symmetric.  Psychiatric:        Behavior: Behavior normal.        Thought Content: Thought content normal.        Judgment: Judgment normal.   R shin eczema LS tender w/ROM  Lab Results  Component Value Date   WBC 3.9 (L) 08/14/2017   HGB 15.7 08/14/2017   HCT 46.1 08/14/2017   PLT 173.0 08/14/2017   GLUCOSE 87 04/21/2019   CHOL 167 08/14/2017   TRIG 107.0 08/14/2017   HDL 35.70 (L) 08/14/2017   LDLDIRECT 88.1 01/23/2011   LDLCALC 110 (H) 08/14/2017   ALT 4 08/14/2017   AST 8 08/14/2017   NA 140 04/21/2019   K 4.5 04/21/2019   CL 107 04/21/2019   CREATININE 0.98 04/21/2019   BUN 14 04/21/2019   CO2 25 04/21/2019   TSH 0.74 08/14/2017   PSA 0.21 08/14/2017   HGBA1C 5.5 04/21/2019   MICROALBUR <0.7 08/14/2017    No results found.  Assessment & Plan:   There are no diagnoses linked to this encounter.   No orders of the defined types were placed in this encounter.    Follow-up: No follow-ups on file.  Walker Kehr, MD

## 2019-08-25 NOTE — Assessment & Plan Note (Signed)
On B12 

## 2019-08-25 NOTE — Assessment & Plan Note (Signed)
OA/MSK Tramadol, Zanaflex prn  Potential benefits of a long term opioids use as well as potential risks (i.e. addiction risk, apnea etc) and complications (i.e. Somnolence, constipation and others) were explained to the patient and were aknowledged.

## 2019-08-29 ENCOUNTER — Other Ambulatory Visit (INDEPENDENT_AMBULATORY_CARE_PROVIDER_SITE_OTHER): Payer: Medicare Other

## 2019-08-29 DIAGNOSIS — M5442 Lumbago with sciatica, left side: Secondary | ICD-10-CM | POA: Diagnosis not present

## 2019-08-29 DIAGNOSIS — E538 Deficiency of other specified B group vitamins: Secondary | ICD-10-CM

## 2019-08-29 DIAGNOSIS — E119 Type 2 diabetes mellitus without complications: Secondary | ICD-10-CM | POA: Diagnosis not present

## 2019-08-29 DIAGNOSIS — Z0000 Encounter for general adult medical examination without abnormal findings: Secondary | ICD-10-CM | POA: Diagnosis not present

## 2019-08-29 DIAGNOSIS — G8929 Other chronic pain: Secondary | ICD-10-CM

## 2019-08-29 DIAGNOSIS — E559 Vitamin D deficiency, unspecified: Secondary | ICD-10-CM

## 2019-08-29 DIAGNOSIS — M5441 Lumbago with sciatica, right side: Secondary | ICD-10-CM

## 2019-08-29 LAB — URINALYSIS
Bilirubin Urine: NEGATIVE
Hgb urine dipstick: NEGATIVE
Ketones, ur: NEGATIVE
Leukocytes,Ua: NEGATIVE
Nitrite: NEGATIVE
Specific Gravity, Urine: 1.01 (ref 1.000–1.030)
Total Protein, Urine: NEGATIVE
Urine Glucose: NEGATIVE
Urobilinogen, UA: 0.2 (ref 0.0–1.0)
pH: 6 (ref 5.0–8.0)

## 2019-08-29 LAB — CBC WITH DIFFERENTIAL/PLATELET
Basophils Absolute: 0.1 10*3/uL (ref 0.0–0.1)
Basophils Relative: 1.2 % (ref 0.0–3.0)
Eosinophils Absolute: 0.2 10*3/uL (ref 0.0–0.7)
Eosinophils Relative: 4.1 % (ref 0.0–5.0)
HCT: 45.8 % (ref 39.0–52.0)
Hemoglobin: 15.6 g/dL (ref 13.0–17.0)
Lymphocytes Relative: 40.2 % (ref 12.0–46.0)
Lymphs Abs: 2 10*3/uL (ref 0.7–4.0)
MCHC: 34 g/dL (ref 30.0–36.0)
MCV: 91 fl (ref 78.0–100.0)
Monocytes Absolute: 0.5 10*3/uL (ref 0.1–1.0)
Monocytes Relative: 9.6 % (ref 3.0–12.0)
Neutro Abs: 2.2 10*3/uL (ref 1.4–7.7)
Neutrophils Relative %: 44.9 % (ref 43.0–77.0)
Platelets: 188 10*3/uL (ref 150.0–400.0)
RBC: 5.03 Mil/uL (ref 4.22–5.81)
RDW: 13.4 % (ref 11.5–15.5)
WBC: 4.9 10*3/uL (ref 4.0–10.5)

## 2019-08-29 LAB — BASIC METABOLIC PANEL
BUN: 10 mg/dL (ref 6–23)
CO2: 25 mEq/L (ref 19–32)
Calcium: 9.3 mg/dL (ref 8.4–10.5)
Chloride: 105 mEq/L (ref 96–112)
Creatinine, Ser: 0.99 mg/dL (ref 0.40–1.50)
GFR: 91.1 mL/min (ref >60.00)
Glucose, Bld: 99 mg/dL (ref 70–99)
Potassium: 4.6 mEq/L (ref 3.5–5.1)
Sodium: 138 mEq/L (ref 135–145)

## 2019-08-29 LAB — HEPATIC FUNCTION PANEL
ALT: 4 U/L (ref 0–53)
AST: 9 U/L (ref 0–37)
Albumin: 4.2 g/dL (ref 3.5–5.2)
Alkaline Phosphatase: 57 U/L (ref 39–117)
Bilirubin, Direct: 0.1 mg/dL (ref 0.0–0.3)
Total Bilirubin: 0.6 mg/dL (ref 0.2–1.2)
Total Protein: 6.7 g/dL (ref 6.0–8.3)

## 2019-08-29 LAB — LIPID PANEL
Cholesterol: 175 mg/dL (ref 0–200)
HDL: 35.5 mg/dL — ABNORMAL LOW (ref >39.00)
LDL Cholesterol: 119 mg/dL — ABNORMAL HIGH (ref 0–99)
NonHDL: 139.48
Total CHOL/HDL Ratio: 5
Triglycerides: 104 mg/dL (ref 0.0–149.0)
VLDL: 20.8 mg/dL (ref 0.0–40.0)

## 2019-08-29 LAB — PSA: PSA: 0.29 ng/mL (ref 0.10–4.00)

## 2019-08-29 LAB — TSH: TSH: 0.87 u[IU]/mL (ref 0.35–4.50)

## 2019-09-01 ENCOUNTER — Encounter: Payer: Self-pay | Admitting: Internal Medicine

## 2019-11-04 ENCOUNTER — Other Ambulatory Visit: Payer: Self-pay | Admitting: Internal Medicine

## 2019-12-22 ENCOUNTER — Other Ambulatory Visit: Payer: Self-pay | Admitting: *Deleted

## 2019-12-22 ENCOUNTER — Encounter: Payer: Self-pay | Admitting: Internal Medicine

## 2019-12-22 ENCOUNTER — Other Ambulatory Visit: Payer: Self-pay

## 2019-12-22 ENCOUNTER — Ambulatory Visit (INDEPENDENT_AMBULATORY_CARE_PROVIDER_SITE_OTHER): Payer: Medicare Other | Admitting: Internal Medicine

## 2019-12-22 DIAGNOSIS — G8929 Other chronic pain: Secondary | ICD-10-CM

## 2019-12-22 DIAGNOSIS — E119 Type 2 diabetes mellitus without complications: Secondary | ICD-10-CM

## 2019-12-22 DIAGNOSIS — E538 Deficiency of other specified B group vitamins: Secondary | ICD-10-CM | POA: Diagnosis not present

## 2019-12-22 DIAGNOSIS — M5442 Lumbago with sciatica, left side: Secondary | ICD-10-CM | POA: Diagnosis not present

## 2019-12-22 DIAGNOSIS — I10 Essential (primary) hypertension: Secondary | ICD-10-CM | POA: Diagnosis not present

## 2019-12-22 DIAGNOSIS — E785 Hyperlipidemia, unspecified: Secondary | ICD-10-CM | POA: Diagnosis not present

## 2019-12-22 DIAGNOSIS — M5441 Lumbago with sciatica, right side: Secondary | ICD-10-CM

## 2019-12-22 LAB — BASIC METABOLIC PANEL
BUN: 14 mg/dL (ref 6–23)
CO2: 25 mEq/L (ref 19–32)
Calcium: 9 mg/dL (ref 8.4–10.5)
Chloride: 106 mEq/L (ref 96–112)
Creatinine, Ser: 0.87 mg/dL (ref 0.40–1.50)
GFR: 105.64 mL/min (ref >60.00)
Glucose, Bld: 82 mg/dL (ref 70–99)
Potassium: 4 mEq/L (ref 3.5–5.1)
Sodium: 138 mEq/L (ref 135–145)

## 2019-12-22 LAB — HEMOGLOBIN A1C: Hgb A1c MFr Bld: 5.3 % (ref 4.6–6.5)

## 2019-12-22 MED ORDER — TIZANIDINE HCL 4 MG PO TABS: ORAL_TABLET | ORAL | 0 refills | Status: DC

## 2019-12-22 MED ORDER — METOPROLOL TARTRATE 25 MG PO TABS: ORAL_TABLET | ORAL | 0 refills | Status: DC

## 2019-12-22 MED ORDER — MELOXICAM 15 MG PO TABS: 15.0000 mg | ORAL_TABLET | Freq: Every day | ORAL | 3 refills | Status: DC | PRN

## 2019-12-22 MED ORDER — TRAZODONE HCL 50 MG PO TABS: ORAL_TABLET | ORAL | 3 refills | Status: DC

## 2019-12-22 MED ORDER — BENAZEPRIL HCL 20 MG PO TABS: 20.0000 mg | ORAL_TABLET | Freq: Every day | ORAL | 3 refills | Status: DC

## 2019-12-22 MED ORDER — POTASSIUM CHLORIDE CRYS ER 20 MEQ PO TBCR: 20.0000 meq | EXTENDED_RELEASE_TABLET | Freq: Every day | ORAL | 3 refills | Status: DC

## 2019-12-22 MED ORDER — METFORMIN HCL 500 MG PO TABS: 500.0000 mg | ORAL_TABLET | Freq: Every day | ORAL | 3 refills | Status: DC

## 2019-12-22 MED ORDER — TRAMADOL HCL 50 MG PO TABS: ORAL_TABLET | ORAL | 3 refills | Status: DC

## 2019-12-22 NOTE — Assessment & Plan Note (Signed)
Tramadol, Zanaflex prn  Potential benefits of a long term opioids use as well as potential risks (i.e. addiction risk, apnea etc) and complications (i.e. Somnolence, constipation and others) were explained to the patient and were aknowledged.

## 2019-12-22 NOTE — Addendum Note (Signed)
Addended by: Cresenciano Lick on: 12/22/2019 09:01 AM   Modules accepted: Orders

## 2019-12-22 NOTE — Patient Instructions (Signed)

## 2019-12-22 NOTE — Assessment & Plan Note (Signed)
Benazepril 

## 2019-12-22 NOTE — Assessment & Plan Note (Signed)
On Metformin 

## 2019-12-22 NOTE — Assessment & Plan Note (Signed)
CT info given

## 2019-12-22 NOTE — Progress Notes (Signed)
Subjective:  Patient ID: Brian Mccullough, male    DOB: 1951/07/07  Age: 69 y.o. MRN: [id number]  CC: No chief complaint on file.   HPI Brian Mccullough presents for DM, HTN, OA f/u  Outpatient Medications Prior to Visit  Medication Sig Dispense Refill  . aspirin 81 MG EC tablet Take 81 mg by mouth daily.      . benazepril (LOTENSIN) 20 MG tablet TAKE 1 TABLET BY MOUTH  DAILY 90 tablet 0  . Cholecalciferol (VITAMIN D3) 1000 UNITS tablet Take 1,000 Units by mouth daily.     . cyanocobalamin 100 MCG tablet Take 1,000 mcg by mouth daily.     . meloxicam (MOBIC) 15 MG tablet TAKE 1 TABLET BY MOUTH  DAILY AS NEEDED FOR PAIN 90 tablet 3  . metFORMIN (GLUCOPHAGE) 500 MG tablet TAKE 1 TABLET BY MOUTH  DAILY WITH BREAKFAST 90 tablet 3  . metoprolol tartrate (LOPRESSOR) 25 MG tablet TAKE 1 TO 2 TABLETS BY  MOUTH TWO TIMES DAILY AS  NEEDED FOR PALPITATIONS 360 tablet 3  . potassium chloride SA (KLOR-CON) 20 MEQ tablet TAKE 1 TABLET BY MOUTH  DAILY 90 tablet 0  . tiZANidine (ZANAFLEX) 4 MG tablet TAKE 1 TABLET BY MOUTH  EVERY 8 HOURS AS NEEDED FOR MUSCLE SPASM(S) 270 tablet 0  . traMADol (ULTRAM) 50 MG tablet TAKE 1 TO 2 TABLETS BY MOUTH TWICE DAILY AS NEEDED 100 tablet 3  . traZODone (DESYREL) 50 MG tablet TAKE 1/2 TO 1 TABLETS BY  MOUTH AT BEDTIME AS NEEDED  FOR SLEEP. 90 tablet 0  . triamcinolone ointment (KENALOG) 0.1 % Apply 1 application topically 2 (two) times daily as needed (rash). 80 g 0  . ciprofloxacin (CIPRO) 500 MG tablet Take 1 tablet (500 mg total) by mouth 2 (two) times daily. 20 tablet 0   No facility-administered medications prior to visit.    ROS: Review of Systems  Constitutional: Negative for appetite change, fatigue and unexpected weight change.  HENT: Negative for congestion, nosebleeds, sneezing, sore throat and trouble swallowing.   Eyes: Negative for itching and visual disturbance.  Respiratory: Negative for cough.   Cardiovascular: Negative for chest pain,  palpitations and leg swelling.  Gastrointestinal: Negative for abdominal distention, blood in stool, diarrhea and nausea.  Genitourinary: Negative for frequency and hematuria.  Musculoskeletal: Positive for back pain. Negative for gait problem, joint swelling and neck pain.  Skin: Negative for rash.  Neurological: Negative for dizziness, tremors, speech difficulty and weakness.  Psychiatric/Behavioral: Negative for agitation, dysphoric mood, sleep disturbance and suicidal ideas. The patient is not nervous/anxious.     Objective:  BP 122/76 (BP Location: Left Arm, Patient Position: Sitting, Cuff Size: Normal)   Pulse (!) 46   Temp 98.5 F (36.9 C) (Oral)   Ht 6\' 4"  (1.93 m)   Wt 219 lb (99.3 kg)   SpO2 98%   BMI 26.66 kg/m   BP Readings from Last 3 Encounters:  12/22/19 122/76  08/25/19 126/74  04/21/19 122/72    Wt Readings from Last 3 Encounters:  12/22/19 219 lb (99.3 kg)  08/25/19 215 lb (97.5 kg)  04/21/19 215 lb (97.5 kg)    Physical Exam Constitutional:      General: He is not in acute distress.    Appearance: He is well-developed.     Comments: NAD  Eyes:     Conjunctiva/sclera: Conjunctivae normal.     Pupils: Pupils are equal, round, and reactive to light.  Neck:  Thyroid: No thyromegaly.     Vascular: No JVD.  Cardiovascular:     Rate and Rhythm: Normal rate and regular rhythm.     Heart sounds: Normal heart sounds. No murmur. No friction rub. No gallop.   Pulmonary:     Effort: Pulmonary effort is normal. No respiratory distress.     Breath sounds: Normal breath sounds. No wheezing or rales.  Chest:     Chest wall: No tenderness.  Abdominal:     General: Bowel sounds are normal. There is no distension.     Palpations: Abdomen is soft. There is no mass.     Tenderness: There is no abdominal tenderness. There is no guarding or rebound.  Musculoskeletal:        General: Tenderness present. Normal range of motion.     Cervical back: Normal range of  motion.  Lymphadenopathy:     Cervical: No cervical adenopathy.  Skin:    General: Skin is warm and dry.     Findings: No rash.  Neurological:     Mental Status: He is alert and oriented to person, place, and time.     Cranial Nerves: No cranial nerve deficit.     Motor: No abnormal muscle tone.     Coordination: Coordination normal.     Gait: Gait normal.     Deep Tendon Reflexes: Reflexes are normal and symmetric.  Psychiatric:        Behavior: Behavior normal.        Thought Content: Thought content normal.        Judgment: Judgment normal.    LS - sensitive w/ROM   Lab Results  Component Value Date   WBC 4.9 08/29/2019   HGB 15.6 08/29/2019   HCT 45.8 08/29/2019   PLT 188.0 08/29/2019   GLUCOSE 99 08/29/2019   CHOL 175 08/29/2019   TRIG 104.0 08/29/2019   HDL 35.50 (L) 08/29/2019   LDLDIRECT 88.1 01/23/2011   LDLCALC 119 (H) 08/29/2019   ALT 4 08/29/2019   AST 9 08/29/2019   NA 138 08/29/2019   K 4.6 08/29/2019   CL 105 08/29/2019   CREATININE 0.99 08/29/2019   BUN 10 08/29/2019   CO2 25 08/29/2019   TSH 0.87 08/29/2019   PSA 0.29 08/29/2019   HGBA1C 5.5 04/21/2019   MICROALBUR <0.7 08/14/2017    No results found.  Assessment & Plan:    Walker Kehr, MD

## 2019-12-22 NOTE — Assessment & Plan Note (Signed)
On B12 

## 2020-03-11 ENCOUNTER — Other Ambulatory Visit: Payer: Self-pay | Admitting: Internal Medicine

## 2020-03-29 ENCOUNTER — Ambulatory Visit: Payer: Medicare Other | Admitting: Internal Medicine

## 2020-04-26 ENCOUNTER — Encounter: Payer: Self-pay | Admitting: Internal Medicine

## 2020-04-26 ENCOUNTER — Other Ambulatory Visit: Payer: Self-pay

## 2020-04-26 ENCOUNTER — Ambulatory Visit (INDEPENDENT_AMBULATORY_CARE_PROVIDER_SITE_OTHER): Payer: Medicare Other | Admitting: Internal Medicine

## 2020-04-26 DIAGNOSIS — E559 Vitamin D deficiency, unspecified: Secondary | ICD-10-CM | POA: Diagnosis not present

## 2020-04-26 DIAGNOSIS — I10 Essential (primary) hypertension: Secondary | ICD-10-CM | POA: Diagnosis not present

## 2020-04-26 DIAGNOSIS — E785 Hyperlipidemia, unspecified: Secondary | ICD-10-CM | POA: Diagnosis not present

## 2020-04-26 DIAGNOSIS — E119 Type 2 diabetes mellitus without complications: Secondary | ICD-10-CM | POA: Diagnosis not present

## 2020-04-26 DIAGNOSIS — M65311 Trigger thumb, right thumb: Secondary | ICD-10-CM

## 2020-04-26 LAB — BASIC METABOLIC PANEL
BUN: 11 mg/dL (ref 6–23)
CO2: 28 mEq/L (ref 19–32)
Calcium: 9.7 mg/dL (ref 8.4–10.5)
Chloride: 107 mEq/L (ref 96–112)
Creatinine, Ser: 0.96 mg/dL (ref 0.40–1.50)
GFR: 94.2 mL/min (ref >60.00)
Glucose, Bld: 84 mg/dL (ref 70–99)
Potassium: 4.3 mEq/L (ref 3.5–5.1)
Sodium: 141 mEq/L (ref 135–145)

## 2020-04-26 LAB — HEMOGLOBIN A1C: Hgb A1c MFr Bld: 5.4 % (ref 4.6–6.5)

## 2020-04-26 MED ORDER — METOPROLOL TARTRATE 25 MG PO TABS: ORAL_TABLET | ORAL | 3 refills | Status: DC

## 2020-04-26 MED ORDER — TIZANIDINE HCL 4 MG PO TABS: ORAL_TABLET | ORAL | 0 refills | Status: DC

## 2020-04-26 MED ORDER — METHYLPREDNISOLONE ACETATE 40 MG/ML IJ SUSP
10.0000 mg | Freq: Once | INTRAMUSCULAR | Status: AC
  Administered 2020-04-26: 10 mg via INTRAMUSCULAR

## 2020-04-26 MED ORDER — POTASSIUM CHLORIDE CRYS ER 20 MEQ PO TBCR: 20.0000 meq | EXTENDED_RELEASE_TABLET | Freq: Every day | ORAL | 3 refills | Status: DC

## 2020-04-26 MED ORDER — TRAMADOL HCL 50 MG PO TABS: ORAL_TABLET | ORAL | 3 refills | Status: DC

## 2020-04-26 MED ORDER — MELOXICAM 15 MG PO TABS: 15.0000 mg | ORAL_TABLET | Freq: Every day | ORAL | 3 refills | Status: DC | PRN

## 2020-04-26 MED ORDER — BENAZEPRIL HCL 20 MG PO TABS: 20.0000 mg | ORAL_TABLET | Freq: Every day | ORAL | 3 refills | Status: DC

## 2020-04-26 MED ORDER — METFORMIN HCL 500 MG PO TABS: 500.0000 mg | ORAL_TABLET | Freq: Every day | ORAL | 3 refills | Status: DC

## 2020-04-26 MED ORDER — TRAZODONE HCL 50 MG PO TABS: ORAL_TABLET | ORAL | 3 refills | Status: DC

## 2020-04-26 NOTE — Assessment & Plan Note (Signed)
A cardiac CT scan for calcium scoring offered 

## 2020-04-26 NOTE — Addendum Note (Signed)
Addended by: Darlys Gales on: 04/26/2020 03:18 PM   Modules accepted: Orders

## 2020-04-26 NOTE — Assessment & Plan Note (Addendum)
On Metformin A cardiac CT scan for calcium scoring offered

## 2020-04-26 NOTE — Assessment & Plan Note (Addendum)
Will inject ACE wrap

## 2020-04-26 NOTE — Patient Instructions (Addendum)
Trigger Finger  Trigger finger, also called stenosing tenosynovitis,  is a condition that causes a finger to get stuck in a bent position. Each finger has a tendon, which is a tough, cord-like tissue that connects muscle to bone, and each tendon passes through a tunnel of tissue called a tendon sheath. To move your finger, your tendon needs to glide freely through the sheath. Trigger finger happens when the tendon or the sheath thickens, making it difficult to move your finger. Trigger finger can affect any finger or a thumb. It may affect more than one finger. Mild cases may clear up with rest and medicine. Severe cases require more treatment. What are the causes? Trigger finger is caused by a thickened finger tendon or tendon sheath. The cause of this thickening is not known. What increases the risk? The following factors may make you more likely to develop this condition:  Doing activities that require a strong grip.  Having rheumatoid arthritis, gout, or diabetes.  Being 68-71 years old.  Being male. What are the signs or symptoms? Symptoms of this condition include:  Pain when bending or straightening your finger.  Tenderness or swelling where your finger attaches to the palm of your hand.  A lump in the palm of your hand or on the inside of your finger.  Hearing a noise like a pop or a snap when you try to straighten your finger.  Feeling a catching or locking sensation when you try to straighten your finger.  Being unable to straighten your finger. How is this diagnosed? This condition is diagnosed based on your symptoms and a physical exam. How is this treated? This condition may be treated by:  Resting your finger and avoiding activities that make symptoms worse.  Wearing a finger splint to keep your finger extended.  Taking NSAIDs, such as ibuprofen, to relieve pain and swelling.  Doing gentle exercises to stretch the finger as told by your health care  provider.  Having medicine that reduces swelling and inflammation (steroids) injected into the tendon sheath. Injections may need to be repeated.  Having surgery to open the tendon sheath. This may be done if other treatments do not work and you cannot straighten your finger. You may need physical therapy after surgery. Follow these instructions at home: If you have a splint:  Wear the splint as told by your health care provider. Remove it only as told by your health care provider.  Loosen it if your fingers tingle, become numb, or turn cold and blue.  Keep it clean.  If the splint is not waterproof: ? Do not let it get wet. ? Cover it with a watertight covering when you take a bath or shower. Managing pain, stiffness, and swelling     If directed, apply heat to the affected area as often as told by your health care provider. Use the heat source that your health care provider recommends, such as a moist heat pack or a heating pad.  Place a towel between your skin and the heat source.  Leave the heat on for 20-30 minutes.  Remove the heat if your skin turns bright red. This is especially important if you are unable to feel pain, heat, or cold. You may have a greater risk of getting burned. If directed, put ice on the painful area. To do this:  If you have a removable splint, remove it as told by your health care provider.  Put ice in a plastic bag.  Place a  towel between your skin and the bag or between your splint and the bag.  Leave the ice on for 20 minutes, 2-3 times a day.  Activity  Rest your finger as told by your health care provider. Avoid activities that make the pain worse.  Return to your normal activities as told by your health care provider. Ask your health care provider what activities are safe for you.  Do exercises as told by your health care provider.  Ask your health care provider when it is safe to drive if you have a splint on your hand. General  instructions  Take over-the-counter and prescription medicines only as told by your health care provider.  Keep all follow-up visits as told by your health care provider. This is important. Contact a health care provider if:  Your symptoms are not improving with home care. Summary  Trigger finger, also called stenosing tenosynovitis, causes your finger to get stuck in a bent position. This can make it difficult and painful to straighten your finger.  This condition develops when a finger tendon or tendon sheath thickens.  Treatment may include resting your finger, wearing a splint, and taking medicines.  In severe cases, surgery to open the tendon sheath may be needed. This information is not intended to replace advice given to you by your health care provider. Make sure you discuss any questions you have with your health care provider. Document Revised: 02/10/2019 Document Reviewed: 02/10/2019 Elsevier Patient Education  [place].   Cardiac CT calcium scoring test $150 Tel # is [phone]   Computed tomography, more commonly known as a CT or CAT scan, is a diagnostic medical imaging test. Like traditional x-rays, it produces multiple images or pictures of the inside of the body. The cross-sectional images generated during a CT scan can be reformatted in multiple planes. They can even generate three-dimensional images. These images can be viewed on a computer monitor, printed on film or by a 3D printer, or transferred to a CD or DVD. CT images of internal organs, bones, soft tissue and blood vessels provide greater detail than traditional x-rays, particularly of soft tissues and blood vessels. A cardiac CT scan for coronary calcium is a non-invasive way of obtaining information about the presence, location and extent of calcified plaque in the coronary arteries--the vessels that supply oxygen-containing blood to the heart muscle. Calcified plaque results when there is a  build-up of fat and other substances under the inner layer of the artery. This material can calcify which signals the presence of atherosclerosis, a disease of the vessel wall, also called coronary artery disease (CAD). People with this disease have an increased risk for heart attacks. In addition, over time, progression of plaque build up (CAD) can narrow the arteries or even close off blood flow to the heart. The result may be chest pain, sometimes called "angina," or a heart attack. Because calcium is a marker of CAD, the amount of calcium detected on a cardiac CT scan is a helpful prognostic tool. The findings on cardiac CT are expressed as a calcium score. Another name for this test is coronary artery calcium scoring.  What are some common uses of the procedure? The goal of cardiac CT scan for calcium scoring is to determine if CAD is present and to what extent, even if there are no symptoms. It is a screening study that may be recommended by a physician for patients with risk factors for CAD but no clinical symptoms. The major risk  factors for CAD are: . high blood cholesterol levels  . family history of heart attacks  . diabetes  . high blood pressure  . cigarette smoking  . overweight or obese  . physical inactivity   A negative cardiac CT scan for calcium scoring shows no calcification within the coronary arteries. This suggests that CAD is absent or so minimal it cannot be seen by this technique. The chance of having a heart attack over the next two to five years is very low under these circumstances. A positive test means that CAD is present, regardless of whether or not the patient is experiencing any symptoms. The amount of calcification--expressed as the calcium score--may help to predict the likelihood of a myocardial infarction (heart attack) in the coming years and helps your medical doctor or cardiologist decide whether the patient may need to take preventive medicine or undertake  other measures such as diet and exercise to lower the risk for heart attack. The extent of CAD is graded according to your calcium score:  Calcium Score  Presence of CAD (coronary artery disease)  0 No evidence of CAD   1-10 Minimal evidence of CAD  11-100 Mild evidence of CAD  101-400 Moderate evidence of CAD  Over 400 Extensive evidence of CAD

## 2020-04-26 NOTE — Assessment & Plan Note (Signed)
Vit D 

## 2020-04-26 NOTE — Progress Notes (Signed)
Subjective:  Patient ID: Brian Mccullough, male    DOB: [birth year]  Age: 69 y.o. MRN: [id number]  CC: No chief complaint on file.   HPI Brian Mccullough presents for trigger thumb on the R  F/u DM, HTN, insomnia  Outpatient Medications Prior to Visit  Medication Sig Dispense Refill  . aspirin 81 MG EC tablet Take 81 mg by mouth daily.      . Cholecalciferol (VITAMIN D3) 1000 UNITS tablet Take 1,000 Units by mouth daily.     . cyanocobalamin 100 MCG tablet Take 1,000 mcg by mouth daily.     Marland Kitchen triamcinolone ointment (KENALOG) 0.1 % Apply 1 application topically 2 (two) times daily as needed (rash). 80 g 0  . benazepril (LOTENSIN) 20 MG tablet Take 1 tablet (20 mg total) by mouth daily. 90 tablet 3  . meloxicam (MOBIC) 15 MG tablet Take 1 tablet (15 mg total) by mouth daily as needed. for pain 90 tablet 3  . metFORMIN (GLUCOPHAGE) 500 MG tablet Take 1 tablet (500 mg total) by mouth daily with breakfast. 90 tablet 3  . metoprolol tartrate (LOPRESSOR) 25 MG tablet TAKE 1 TO 2 TABLETS BY  MOUTH TWICE DAILY AS NEEDED FOR PALPITATIONS 360 tablet 3  . potassium chloride SA (KLOR-CON) 20 MEQ tablet Take 1 tablet (20 mEq total) by mouth daily. 90 tablet 3  . tiZANidine (ZANAFLEX) 4 MG tablet TAKE 1 TABLET BY MOUTH  EVERY 8 HOURS AS NEEDED FOR MUSCLE SPASM(S) 270 tablet 0  . traMADol (ULTRAM) 50 MG tablet TAKE 1 TO 2 TABLETS BY MOUTH TWICE DAILY AS NEEDED 100 tablet 3  . traZODone (DESYREL) 50 MG tablet TAKE 1/2 TO 1 TABLETS BY  MOUTH AT BEDTIME AS NEEDED  FOR SLEEP. 90 tablet 3   No facility-administered medications prior to visit.    ROS: Review of Systems  Objective:  BP 138/88 (BP Location: Left Arm, Patient Position: Sitting, Cuff Size: Normal)   Pulse (!) 57   Temp 99 F (37.2 C) (Oral)   Ht 6\' 4"  (1.93 m)   Wt 219 lb (99.3 kg)   SpO2 97%   BMI 26.66 kg/m   BP Readings from Last 3 Encounters:  04/26/20 138/88  12/22/19 122/76  08/25/19 126/74    Wt Readings from Last 3  Encounters:  04/26/20 219 lb (99.3 kg)  12/22/19 219 lb (99.3 kg)  08/25/19 215 lb (97.5 kg)    Physical Exam  Trigger R thumb   Procedure Note :    Trigger finger tendon Injection:   Indication : Trigger thumb R   Risks including unsuccessful procedure , bleeding, infection, bruising, skin atrophy and others were explained to the patient in detail as well as the benefits. Informed consent was obtained verbally.   Tthe patient was placed in a comfortable position.    Flexor digitorum tendon was marked and  the skin was prepped with Betadine and alcohol. 1 inch 25-gauge needle was used. The needle was advanced  into the skin down to tendon. It  was injected with 0.5 mL of 2% lidocaine and 10 mg of Depo-Medrol in a usual fashion.  Band-Aids applied.   Tolerated well. Complications: None. Good pain relief following the procedure.  Lab Results  Component Value Date   WBC 4.9 08/29/2019   HGB 15.6 08/29/2019   HCT 45.8 08/29/2019   PLT 188.0 08/29/2019   GLUCOSE 82 12/22/2019   CHOL 175 08/29/2019   TRIG 104.0 08/29/2019   HDL 35.50 (  L) 08/29/2019   LDLDIRECT 88.1 01/23/2011   LDLCALC 119 (H) 08/29/2019   ALT 4 08/29/2019   AST 9 08/29/2019   NA 138 12/22/2019   K 4.0 12/22/2019   CL 106 12/22/2019   CREATININE 0.87 12/22/2019   BUN 14 12/22/2019   CO2 25 12/22/2019   TSH 0.87 08/29/2019   PSA 0.29 08/29/2019   HGBA1C 5.3 12/22/2019   MICROALBUR <0.7 08/14/2017    No results found.  Assessment & Plan:   There are no diagnoses linked to this encounter.   Meds ordered this encounter  Medications  . benazepril (LOTENSIN) 20 MG tablet    Sig: Take 1 tablet (20 mg total) by mouth daily.    Dispense:  90 tablet    Refill:  3    Requesting 1 year supply  . meloxicam (MOBIC) 15 MG tablet    Sig: Take 1 tablet (15 mg total) by mouth daily as needed. for pain    Dispense:  90 tablet    Refill:  3    Requesting 1 year supply  . metFORMIN (GLUCOPHAGE) 500 MG tablet      Sig: Take 1 tablet (500 mg total) by mouth daily with breakfast.    Dispense:  90 tablet    Refill:  3    Requesting 1 year supply  . metoprolol tartrate (LOPRESSOR) 25 MG tablet    Sig: TAKE 1 TO 2 TABLETS BY  MOUTH TWICE DAILY AS NEEDED FOR PALPITATIONS    Dispense:  360 tablet    Refill:  3    Requesting 1 year supply  . potassium chloride SA (KLOR-CON) 20 MEQ tablet    Sig: Take 1 tablet (20 mEq total) by mouth daily.    Dispense:  90 tablet    Refill:  3    Requesting 1 year supply  . tiZANidine (ZANAFLEX) 4 MG tablet    Sig: TAKE 1 TABLET BY MOUTH  EVERY 8 HOURS AS NEEDED FOR MUSCLE SPASM(S)    Dispense:  270 tablet    Refill:  0  . traMADol (ULTRAM) 50 MG tablet    Sig: TAKE 1 TO 2 TABLETS BY MOUTH TWICE DAILY AS NEEDED    Dispense:  100 tablet    Refill:  3  . traZODone (DESYREL) 50 MG tablet    Sig: TAKE 1/2 TO 1 TABLETS BY  MOUTH AT BEDTIME AS NEEDED  FOR SLEEP.    Dispense:  90 tablet    Refill:  3    Requesting 1 year supply     Follow-up: Return in about 3 months (around 07/27/2020) for a follow-up visit.  Walker Kehr, MD

## 2020-04-27 LAB — URINALYSIS
Bilirubin Urine: NEGATIVE
Glucose, UA: NEGATIVE
Hgb urine dipstick: NEGATIVE
Ketones, ur: NEGATIVE
Leukocytes,Ua: NEGATIVE
Nitrite: NEGATIVE
Protein, ur: NEGATIVE
Specific Gravity, Urine: 1.006 (ref 1.001–1.03)
pH: 5.5 (ref 5.0–8.0)

## 2020-04-27 LAB — MICROALBUMIN / CREATININE URINE RATIO
Creatinine, Urine: 29 mg/dL (ref 20–320)
Microalb, Ur: <0.2 mg/dL

## 2020-05-12 ENCOUNTER — Other Ambulatory Visit: Payer: Self-pay

## 2020-06-29 ENCOUNTER — Ambulatory Visit: Payer: Medicare Other

## 2020-07-09 DIAGNOSIS — I252 Old myocardial infarction: Secondary | ICD-10-CM

## 2020-07-09 HISTORY — DX: Old myocardial infarction: I25.2

## 2020-07-27 ENCOUNTER — Ambulatory Visit: Payer: Medicare Other | Admitting: Internal Medicine

## 2020-07-30 ENCOUNTER — Encounter: Payer: Self-pay | Admitting: Internal Medicine

## 2020-07-30 ENCOUNTER — Ambulatory Visit (INDEPENDENT_AMBULATORY_CARE_PROVIDER_SITE_OTHER): Payer: Medicare Other

## 2020-07-30 ENCOUNTER — Ambulatory Visit (INDEPENDENT_AMBULATORY_CARE_PROVIDER_SITE_OTHER): Payer: Medicare Other | Admitting: Internal Medicine

## 2020-07-30 ENCOUNTER — Other Ambulatory Visit: Payer: Self-pay

## 2020-07-30 VITALS — BP 142/78 | HR 39 | Temp 97.2°F | Ht 76.0 in | Wt 225.2 lb

## 2020-07-30 DIAGNOSIS — E291 Testicular hypofunction: Secondary | ICD-10-CM

## 2020-07-30 DIAGNOSIS — E538 Deficiency of other specified B group vitamins: Secondary | ICD-10-CM

## 2020-07-30 DIAGNOSIS — N320 Bladder-neck obstruction: Secondary | ICD-10-CM

## 2020-07-30 DIAGNOSIS — E119 Type 2 diabetes mellitus without complications: Secondary | ICD-10-CM

## 2020-07-30 DIAGNOSIS — Z23 Encounter for immunization: Secondary | ICD-10-CM

## 2020-07-30 DIAGNOSIS — Z0000 Encounter for general adult medical examination without abnormal findings: Secondary | ICD-10-CM

## 2020-07-30 DIAGNOSIS — E559 Vitamin D deficiency, unspecified: Secondary | ICD-10-CM

## 2020-07-30 LAB — CBC WITH DIFFERENTIAL/PLATELET
Basophils Absolute: 0.1 10*3/uL (ref 0.0–0.1)
Basophils Relative: 2.3 % (ref 0.0–3.0)
Eosinophils Absolute: 0.3 10*3/uL (ref 0.0–0.7)
Eosinophils Relative: 5.1 % — ABNORMAL HIGH (ref 0.0–5.0)
HCT: 45.3 % (ref 39.0–52.0)
Hemoglobin: 15.2 g/dL (ref 13.0–17.0)
Lymphocytes Relative: 37.6 % (ref 12.0–46.0)
Lymphs Abs: 1.9 10*3/uL (ref 0.7–4.0)
MCHC: 33.5 g/dL (ref 30.0–36.0)
MCV: 90.2 fl (ref 78.0–100.0)
Monocytes Absolute: 0.4 10*3/uL (ref 0.1–1.0)
Monocytes Relative: 8.4 % (ref 3.0–12.0)
Neutro Abs: 2.4 10*3/uL (ref 1.4–7.7)
Neutrophils Relative %: 46.6 % (ref 43.0–77.0)
Platelets: 172 10*3/uL (ref 150.0–400.0)
RBC: 5.03 Mil/uL (ref 4.22–5.81)
RDW: 13.2 % (ref 11.5–15.5)
WBC: 5.1 10*3/uL (ref 4.0–10.5)

## 2020-07-30 LAB — COMPREHENSIVE METABOLIC PANEL
ALT: 6 U/L (ref 0–53)
AST: 11 U/L (ref 0–37)
Albumin: 4.2 g/dL (ref 3.5–5.2)
Alkaline Phosphatase: 62 U/L (ref 39–117)
BUN: 11 mg/dL (ref 6–23)
CO2: 30 mEq/L (ref 19–32)
Calcium: 9.4 mg/dL (ref 8.4–10.5)
Chloride: 106 mEq/L (ref 96–112)
Creatinine, Ser: 0.93 mg/dL (ref 0.40–1.50)
GFR: 84.42 mL/min (ref >60.00)
Glucose, Bld: 78 mg/dL (ref 70–99)
Potassium: 4.5 mEq/L (ref 3.5–5.1)
Sodium: 141 mEq/L (ref 135–145)
Total Bilirubin: 0.5 mg/dL (ref 0.2–1.2)
Total Protein: 6.6 g/dL (ref 6.0–8.3)

## 2020-07-30 LAB — LIPID PANEL
Cholesterol: 179 mg/dL (ref 0–200)
HDL: 36.5 mg/dL — ABNORMAL LOW (ref >39.00)
LDL Cholesterol: 116 mg/dL — ABNORMAL HIGH (ref 0–99)
NonHDL: 142.26
Total CHOL/HDL Ratio: 5
Triglycerides: 133 mg/dL (ref 0.0–149.0)
VLDL: 26.6 mg/dL (ref 0.0–40.0)

## 2020-07-30 LAB — PSA: PSA: 0.26 ng/mL (ref 0.10–4.00)

## 2020-07-30 LAB — TSH: TSH: 0.88 u[IU]/mL (ref 0.35–4.50)

## 2020-07-30 LAB — HEMOGLOBIN A1C: Hgb A1c MFr Bld: 5.7 % (ref 4.6–6.5)

## 2020-07-30 MED ORDER — TIZANIDINE HCL 4 MG PO TABS: ORAL_TABLET | ORAL | 0 refills | Status: DC

## 2020-07-30 MED ORDER — TRAMADOL HCL 50 MG PO TABS
ORAL_TABLET | ORAL | 3 refills | Status: DC
Start: 2020-07-30 — End: 2020-12-06

## 2020-07-30 MED ORDER — METOPROLOL TARTRATE 25 MG PO TABS
ORAL_TABLET | ORAL | 3 refills | Status: DC
Start: 2020-07-30 — End: 2020-08-09

## 2020-07-30 MED ORDER — METFORMIN HCL 500 MG PO TABS: 500.0000 mg | ORAL_TABLET | Freq: Every day | ORAL | 3 refills | Status: DC

## 2020-07-30 MED ORDER — BENAZEPRIL HCL 20 MG PO TABS: 20.0000 mg | ORAL_TABLET | Freq: Every day | ORAL | 3 refills | Status: DC

## 2020-07-30 MED ORDER — TRAZODONE HCL 50 MG PO TABS
ORAL_TABLET | ORAL | 3 refills | Status: DC
Start: 2020-07-30 — End: 2021-06-03

## 2020-07-30 MED ORDER — POTASSIUM CHLORIDE CRYS ER 20 MEQ PO TBCR
20.0000 meq | EXTENDED_RELEASE_TABLET | Freq: Every day | ORAL | 3 refills | Status: DC
Start: 2020-07-30 — End: 2020-08-03

## 2020-07-30 NOTE — Assessment & Plan Note (Signed)
On B12 

## 2020-07-30 NOTE — Assessment & Plan Note (Signed)
Vit D 

## 2020-07-30 NOTE — Progress Notes (Addendum)
I connected with [name] today by telephone and verified that I am speaking with the correct person using two identifiers. Location patient: home Location provider: work Persons participating in the virtual visit: Jaecion Dempster and Lisette Abu, LPN.   I discussed the limitations, risks, security and privacy concerns of performing an evaluation and management service by telephone and the availability of in person appointments. I also discussed with the patient that there may be a patient responsible charge related to this service. The patient expressed understanding and verbally consented to this telephonic visit.    Interactive audio and video telecommunications were attempted between this provider and patient, however failed, due to patient having technical difficulties OR patient did not have access to video capability.  We continued and completed visit with audio only.  Some vital signs may be absent or patient reported.   Time Spent with patient on telephone encounter: 20 minutes  Subjective:   Brian Mccullough is a 69 y.o. male who presents for Medicare Annual/Subsequent preventive examination.  Review of Systems    No ROS. Medicare Wellness Visit. Additional risk factors are reflected in social history. Cardiac Risk Factors include: advanced age (>61men, >32 women);diabetes mellitus;dyslipidemia;family history of premature cardiovascular disease;hypertension;male gender     Objective:    There were no vitals filed for this visit. There is no height or weight on file to calculate BMI.  Advanced Directives 07/30/2020 05/31/2017 04/18/2017 04/04/2017 02/02/2014  Does Patient Have a Medical Advance Directive? No No No No Patient does not have advance directive;Patient would not like information  Would patient like information on creating a medical advance directive? No - Patient declined No - Patient declined - - -    Current Medications (verified) Outpatient Encounter  Medications as of 07/30/2020  Medication Sig   aspirin 81 MG EC tablet Take 81 mg by mouth daily.     benazepril (LOTENSIN) 20 MG tablet Take 1 tablet (20 mg total) by mouth daily.   Cholecalciferol (VITAMIN D3) 1000 UNITS tablet Take 1,000 Units by mouth daily.    cyanocobalamin 100 MCG tablet Take 1,000 mcg by mouth daily.    meloxicam (MOBIC) 15 MG tablet Take 1 tablet (15 mg total) by mouth daily as needed. for pain   metFORMIN (GLUCOPHAGE) 500 MG tablet Take 1 tablet (500 mg total) by mouth daily with breakfast.   metoprolol tartrate (LOPRESSOR) 25 MG tablet TAKE 1 TO 2 TABLETS BY  MOUTH TWICE DAILY AS NEEDED FOR PALPITATIONS   potassium chloride SA (KLOR-CON) 20 MEQ tablet Take 1 tablet (20 mEq total) by mouth daily.   traZODone (DESYREL) 50 MG tablet TAKE 1/2 TO 1 TABLETS BY  MOUTH AT BEDTIME AS NEEDED  FOR SLEEP.   triamcinolone ointment (KENALOG) 0.1 % Apply 1 application topically 2 (two) times daily as needed (rash).   No facility-administered encounter medications on file as of 07/30/2020.    Allergies (verified) Codeine   History: Past Medical History:  Diagnosis Date   Allergy    Hyperactive gag reflex    Hypertension    under control with meds., has been on med. x 30 yr.   Inguinal hernia 01/2014   bilateral   LBP (low back pain)    Non-insulin dependent type 2 diabetes mellitus (HCC)    OA (osteoarthritis) of knee 01/2004   left   Palpitations    Vitamin B 12 deficiency    Past Surgical History:  Procedure Laterality Date   INGUINAL HERNIA REPAIR  age  3-5   INGUINAL HERNIA REPAIR Bilateral 02/09/2014   Procedure: OPEN BILATERAL INGUINAL HERNIA REPAIR WITH MESH;  Surgeon: [name], MD;  Location: [facility];  Service: General;  Laterality: Bilateral;   INSERTION OF MESH Bilateral 02/09/2014   Procedure: INSERTION OF MESH;  Surgeon: [name], MD;  Location: [facility];  Service: General;  Laterality: Bilateral;   KNEE  ARTHROSCOPY Right    ORCHIECTOMY Left 05/31/2017   Procedure: LEFT RADICAL ORCHIECTOMY;  Surgeon: Irine Seal, MD;  Location: [facility];  Service: Urology;  Laterality: Left;   TONSILLECTOMY AND ADENOIDECTOMY  1960   TOTAL KNEE ARTHROPLASTY Left 01/14/2004   Family History  Problem Relation Age of Onset   Hypertension Mother    Diabetes Mother    Diabetes Father    Colon cancer Neg Hx    Esophageal cancer Neg Hx    Rectal cancer Neg Hx    Stomach cancer Neg Hx    Social History   Socioeconomic History   Marital status: Married    Spouse name: Not on file   Number of children: Not on file   Years of education: Not on file   Highest education level: Not on file  Occupational History   Occupation: diabled    Employer: UNEMPLOYED    Comment: due to knees  Tobacco Use   Smoking status: Current Every Day Smoker    Years: 22.00    Types: Cigars   Smokeless tobacco: Never Used   Tobacco comment: 2-3 small cigars/day  Vaping Use   Vaping Use: Never used  Substance and Sexual Activity   Alcohol use: No   Drug use: No   Sexual activity: Yes  Other Topics Concern   Not on file  Social History Narrative   Not on file   Social Determinants of Health   Financial Resource Strain: Low Risk    Difficulty of Paying Living Expenses: Not hard at all  Food Insecurity: No Food Insecurity   Worried About Charity fundraiser in the Last Year: Never true   [place] in the Last Year: Never true  Transportation Needs: No Transportation Needs   Lack of Transportation (Medical): No   Lack of Transportation (Non-Medical): No  Physical Activity: Sufficiently Active   Days of Exercise per Week: 5 days   Minutes of Exercise per Session: 30 min  Stress: No Stress Concern Present   Feeling of Stress : Not at all  Social Connections: Socially Integrated   Frequency of Communication with Friends and Family: More than three times a week   Frequency of Social Gatherings  with Friends and Family: More than three times a week   Attends Religious Services: More than 4 times per year   Active Member of [employer] or Organizations: Yes   Attends Music therapist: More than 4 times per year   Marital Status: Married    Tobacco Counseling Ready to quit: Not Answered Counseling given: Not Answered Comment: 2-3 small cigars/day   Clinical Intake:  Pre-visit preparation completed: Yes  Pain : No/denies pain     Nutritional Risks: None Diabetes: Yes CBG done?: No Did pt. bring in CBG monitor from home?: No  How often do you need to have someone help you when you read instructions, pamphlets, or other written materials from your doctor or pharmacy?: 1 - Never What is the last grade level you completed in school?: HSG  Diabetic? yes  Interpreter Needed?: No  Information entered by :: Lisette Abu, LPN   Activities of Daily Living In your present state of health, do you have any difficulty performing the following activities: 07/30/2020  Hearing? N  Vision? N  Difficulty concentrating or making decisions? N  Walking or climbing stairs? N  Dressing or bathing? N  Doing errands, shopping? N  Preparing Food and eating ? N  Using the Toilet? N  In the past six months, have you accidently leaked urine? N  Do you have problems with loss of bowel control? N  Managing your Medications? N  Managing your Finances? N  Housekeeping or managing your Housekeeping? N  Some recent data might be hidden    Patient Care Team: [name], Evie Lacks, MD as PCP - General Marily Memos, MD (Orthopedic Surgery)  Indicate any recent Medical Services you may have received from other than [facility] providers in the past year (date may be approximate).     Assessment:   This is a routine wellness examination for [place].  Hearing/Vision screen No exam data present  Dietary issues and exercise activities discussed: Current Exercise Habits: Home  exercise routine, Type of exercise: walking;Other - see comments (yard work), Time (Minutes): 30, Frequency (Times/Week): 5, Weekly Exercise (Minutes/Week): 150, Intensity: Moderate, Exercise limited by: None identified  Goals   None    Depression Screen PHQ 2/9 Scores 07/30/2020 12/22/2019 12/20/2018 04/09/2017  PHQ - 2 Score 0 0 0 0    Fall Risk Fall Risk  07/30/2020 12/22/2019 12/20/2018 04/09/2017  Falls in the past year? 0 0 0 No  Number falls in past yr: 0 - - -  Injury with Fall? 0 - - -  Risk for fall due to : No Fall Risks - - -  Follow up Falls evaluation completed Falls evaluation completed Falls evaluation completed -    Any stairs in or around the home? No  If so, are there any without handrails? No  Home free of loose throw rugs in walkways, pet beds, electrical cords, etc? Yes  Adequate lighting in your home to reduce risk of falls? Yes   ASSISTIVE DEVICES UTILIZED TO PREVENT FALLS:  Life alert? No  Use of a cane, walker or w/c? No  Grab bars in the bathroom? No  Shower chair or bench in shower? No  Elevated toilet seat or a handicapped toilet? No   TIMED UP AND GO:  Was the test performed? No .  Length of time to ambulate 10 feet: 0 sec.   Gait steady and fast without use of assistive device  Cognitive Function: Patient is cogitatively intact.        Immunizations Immunization History  Administered Date(s) Administered   Fluad Quad(high Dose 65+) 07/30/2020   Influenza Split 09/28/2011, 06/19/2012   Influenza Whole 06/29/2008, 07/05/2009, 09/20/2010   Influenza, High Dose Seasonal PF 08/14/2017, 06/14/2018, 07/12/2019   Influenza,inj,Quad PF,6+ Mos 06/19/2013, 05/29/2014, 08/30/2015, 08/01/2016   PFIZER SARS-COV-2 Vaccination 10/28/2019, 11/18/2019   Pneumococcal Conjugate-13 05/29/2014   Pneumococcal Polysaccharide-23 11/02/2009, 11/15/2017   Td 11/02/2009   Tdap 07/30/2020   Zoster 12/30/2013    TDAP status: Up to date Flu Vaccine status: Up to  date Pneumococcal vaccine status: Up to date Covid-19 vaccine status: Completed vaccines  Qualifies for Shingles Vaccine? Yes   Zostavax completed Yes   Shingrix Completed?: No.    Education has been provided regarding the importance of this vaccine. Patient has been advised to call insurance company to determine out of  pocket expense if they have not yet received this vaccine. Advised may also receive vaccine at local pharmacy or Health Dept. Verbalized acceptance and understanding.  Screening Tests Health Maintenance  Topic Date Due   FOOT EXAM  Never done   OPHTHALMOLOGY EXAM  05/25/2019   COLONOSCOPY  04/18/2020   HEMOGLOBIN A1C  10/27/2020   TETANUS/TDAP  07/30/2030   INFLUENZA VACCINE  Completed   COVID-19 Vaccine  Completed   Hepatitis C Screening  Completed   PNA vac Low Risk Adult  Completed    Health Maintenance  Health Maintenance Due  Topic Date Due   FOOT EXAM  Never done   OPHTHALMOLOGY EXAM  05/25/2019   COLONOSCOPY  04/18/2020    Colorectal cancer screening: Completed 04/18/2017. Repeat every 3 years  Lung Cancer Screening: (Low Dose CT Chest recommended if Age 74-80 years, 30 pack-year currently smoking OR have quit w/in 15years.) does not qualify.   Lung Cancer Screening Referral: no  Additional Screening:  Hepatitis C Screening: does qualify; Completed yes  Vision Screening: Recommended annual ophthalmology exams for early detection of glaucoma and other disorders of the eye. Is the patient up to date with their annual eye exam?  Yes  Who is the provider or what is the name of the office in which the patient attends annual eye exams? [facility] If pt is not established with a provider, would they like to be referred to a provider to establish care? No .   Dental Screening: Recommended annual dental exams for proper oral hygiene  Community Resource Referral / Chronic Care Management: CRR required this visit?  No   CCM required this visit?  No       Plan:     I have personally reviewed and noted the following in the patient's chart:   Medical and social history Use of alcohol, tobacco or illicit drugs  Current medications and supplements Functional ability and status Nutritional status Physical activity Advanced directives List of other physicians Hospitalizations, surgeries, and ER visits in previous 12 months Vitals Screenings to include cognitive, depression, and falls Referrals and appointments  In addition, I have reviewed and discussed with patient certain preventive protocols, quality metrics, and best practice recommendations. A written personalized care plan for preventive services as well as general preventive health recommendations were provided to patient.     Sheral Flow, LPN   [id number]   Nurse Notes:  There were no vitals filed for this visit. There is no height or weight on file to calculate BMI. Patient stated that he has no issues with gait or balance; does not use any assistive devices.  Medical screening examination/treatment/procedure(s) were performed by non-physician practitioner and as supervising physician I was immediately available for consultation/collaboration.  I agree with above. Lew Dawes, MD

## 2020-07-30 NOTE — Patient Instructions (Addendum)
Brian Mccullough , Thank you for taking time to come for your Medicare Wellness Visit. I appreciate your ongoing commitment to your health goals. Please review the following plan we discussed and let me know if I can assist you in the future.   Screening recommendations/referrals: Colonoscopy: 04/18/2017; due every 3 years Recommended yearly ophthalmology/optometry visit for glaucoma screening and checkup Recommended yearly dental visit for hygiene and checkup  Vaccinations: Influenza vaccine: 07/30/2020 Pneumococcal vaccine: up to date Tdap vaccine: 07/30/2020 Shingles vaccine: never done   Covid-19: up to date (with booster)  Advanced directives: Advance directive discussed with you today. Even though you declined this today please call our office should you change your mind and we can give you the proper paperwork for you to fill out.  Conditions/risks identified: Yes; Reviewed health maintenance screenings with patient today and relevant education, vaccines, and/or referrals were provided. Please continue to do your personal lifestyle choices by: daily care of teeth and gums, regular physical activity (goal should be 5 days a week for 30 minutes), eat a healthy diet, avoid tobacco and drug use, limiting any alcohol intake, taking a low-dose aspirin (if not allergic or have been advised by your provider otherwise) and taking vitamins and minerals as recommended by your provider. Continue doing brain stimulating activities (puzzles, reading, adult coloring books, staying active) to keep memory sharp. Continue to eat heart healthy diet (full of fruits, vegetables, whole grains, lean protein, water--limit salt, fat, and sugar intake) and increase physical activity as tolerated.  Next appointment:  Please schedule your next Medicare Wellness Visit with your Nurse Health Advisor in 1 year by calling [phone].   Preventive Care 68 Years and Older, Male Preventive care refers to lifestyle choices and  visits with your health care provider that can promote health and wellness. What does preventive care include?  A yearly physical exam. This is also called an annual well check.  Dental exams once or twice a year.  Routine eye exams. Ask your health care provider how often you should have your eyes checked.  Personal lifestyle choices, including:  Daily care of your teeth and gums.  Regular physical activity.  Eating a healthy diet.  Avoiding tobacco and drug use.  Limiting alcohol use.  Practicing safe sex.  Taking low doses of aspirin every day.  Taking vitamin and mineral supplements as recommended by your health care provider. What happens during an annual well check? The services and screenings done by your health care provider during your annual well check will depend on your age, overall health, lifestyle risk factors, and family history of disease. Counseling  Your health care provider may ask you questions about your:  Alcohol use.  Tobacco use.  Drug use.  Emotional well-being.  Home and relationship well-being.  Sexual activity.  Eating habits.  History of falls.  Memory and ability to understand (cognition).  Work and work Statistician. Screening  You may have the following tests or measurements:  Height, weight, and BMI.  Blood pressure.  Lipid and cholesterol levels. These may be checked every 5 years, or more frequently if you are over 22 years old.  Skin check.  Lung cancer screening. You may have this screening every year starting at age 68 if you have a 30-pack-year history of smoking and currently smoke or have quit within the past 15 years.  Fecal occult blood test (FOBT) of the stool. You may have this test every year starting at age 68.  Flexible sigmoidoscopy or colonoscopy.  You may have a sigmoidoscopy every 5 years or a colonoscopy every 10 years starting at age 68.  Prostate cancer screening. Recommendations will vary  depending on your family history and other risks.  Hepatitis C blood test.  Hepatitis B blood test.  Sexually transmitted disease (STD) testing.  Diabetes screening. This is done by checking your blood sugar (glucose) after you have not eaten for a while (fasting). You may have this done every 1-3 years.  Abdominal aortic aneurysm (AAA) screening. You may need this if you are a current or former smoker.  Osteoporosis. You may be screened starting at age 68 if you are at high risk. Talk with your health care provider about your test results, treatment options, and if necessary, the need for more tests. Vaccines  Your health care provider may recommend certain vaccines, such as:  Influenza vaccine. This is recommended every year.  Tetanus, diphtheria, and acellular pertussis (Tdap, Td) vaccine. You may need a Td booster every 10 years.  Zoster vaccine. You may need this after age 21.  Pneumococcal 13-valent conjugate (PCV13) vaccine. One dose is recommended after age 2.  Pneumococcal polysaccharide (PPSV23) vaccine. One dose is recommended after age 27. Talk to your health care provider about which screenings and vaccines you need and how often you need them. This information is not intended to replace advice given to you by your health care provider. Make sure you discuss any questions you have with your health care provider. Document Released: 10/22/2015 Document Revised: 06/14/2016 Document Reviewed: 07/27/2015 Elsevier Interactive Patient Education  2017 [place] Prevention in the Home Falls can cause injuries. They can happen to people of all ages. There are many things you can do to make your home safe and to help prevent falls. What can I do on the outside of my home?  Regularly fix the edges of walkways and driveways and fix any cracks.  Remove anything that might make you trip as you walk through a door, such as a raised step or threshold.  Trim any bushes  or trees on the path to your home.  Use bright outdoor lighting.  Clear any walking paths of anything that might make someone trip, such as rocks or tools.  Regularly check to see if handrails are loose or broken. Make sure that both sides of any steps have handrails.  Any raised decks and porches should have guardrails on the edges.  Have any leaves, snow, or ice cleared regularly.  Use sand or salt on walking paths during winter.  Clean up any spills in your garage right away. This includes oil or grease spills. What can I do in the bathroom?  Use night lights.  Install grab bars by the toilet and in the tub and shower. Do not use towel bars as grab bars.  Use non-skid mats or decals in the tub or shower.  If you need to sit down in the shower, use a plastic, non-slip stool.  Keep the floor dry. Clean up any water that spills on the floor as soon as it happens.  Remove soap buildup in the tub or shower regularly.  Attach bath mats securely with double-sided non-slip rug tape.  Do not have throw rugs and other things on the floor that can make you trip. What can I do in the bedroom?  Use night lights.  Make sure that you have a light by your bed that is easy to reach.  Do not use any sheets  or blankets that are too big for your bed. They should not hang down onto the floor.  Have a firm chair that has side arms. You can use this for support while you get dressed.  Do not have throw rugs and other things on the floor that can make you trip. What can I do in the kitchen?  Clean up any spills right away.  Avoid walking on wet floors.  Keep items that you use a lot in easy-to-reach places.  If you need to reach something above you, use a strong step stool that has a grab bar.  Keep electrical cords out of the way.  Do not use floor polish or wax that makes floors slippery. If you must use wax, use non-skid floor wax.  Do not have throw rugs and other things on  the floor that can make you trip. What can I do with my stairs?  Do not leave any items on the stairs.  Make sure that there are handrails on both sides of the stairs and use them. Fix handrails that are broken or loose. Make sure that handrails are as long as the stairways.  Check any carpeting to make sure that it is firmly attached to the stairs. Fix any carpet that is loose or worn.  Avoid having throw rugs at the top or bottom of the stairs. If you do have throw rugs, attach them to the floor with carpet tape.  Make sure that you have a light switch at the top of the stairs and the bottom of the stairs. If you do not have them, ask someone to add them for you. What else can I do to help prevent falls?  Wear shoes that:  Do not have high heels.  Have rubber bottoms.  Are comfortable and fit you well.  Are closed at the toe. Do not wear sandals.  If you use a stepladder:  Make sure that it is fully opened. Do not climb a closed stepladder.  Make sure that both sides of the stepladder are locked into place.  Ask someone to hold it for you, if possible.  Clearly mark and make sure that you can see:  Any grab bars or handrails.  First and last steps.  Where the edge of each step is.  Use tools that help you move around (mobility aids) if they are needed. These include:  Canes.  Walkers.  Scooters.  Crutches.  Turn on the lights when you go into a dark area. Replace any light bulbs as soon as they burn out.  Set up your furniture so you have a clear path. Avoid moving your furniture around.  If any of your floors are uneven, fix them.  If there are any pets around you, be aware of where they are.  Review your medicines with your doctor. Some medicines can make you feel dizzy. This can increase your chance of falling. Ask your doctor what other things that you can do to help prevent falls. This information is not intended to replace advice given to you by  your health care provider. Make sure you discuss any questions you have with your health care provider. Document Released: 07/22/2009 Document Revised: 03/02/2016 Document Reviewed: 10/30/2014 Elsevier Interactive Patient Education  2017 [employer].

## 2020-07-30 NOTE — Assessment & Plan Note (Signed)
Labs Metformin 

## 2020-07-30 NOTE — Progress Notes (Signed)
Subjective:  Patient ID: Brian Mccullough, male    DOB: 08-23-1951  Age: 69 y.o. MRN: [id number]  CC: Follow-up   HPI ARVIL UTZ presents for a well exam and HTN,DM, OA f/u  Outpatient Medications Prior to Visit  Medication Sig Dispense Refill  . aspirin 81 MG EC tablet Take 81 mg by mouth daily.      . benazepril (LOTENSIN) 20 MG tablet Take 1 tablet (20 mg total) by mouth daily. 90 tablet 3  . Cholecalciferol (VITAMIN D3) 1000 UNITS tablet Take 1,000 Units by mouth daily.     . cyanocobalamin 100 MCG tablet Take 1,000 mcg by mouth daily.     . meloxicam (MOBIC) 15 MG tablet Take 1 tablet (15 mg total) by mouth daily as needed. for pain 90 tablet 3  . metFORMIN (GLUCOPHAGE) 500 MG tablet Take 1 tablet (500 mg total) by mouth daily with breakfast. 90 tablet 3  . metoprolol tartrate (LOPRESSOR) 25 MG tablet TAKE 1 TO 2 TABLETS BY  MOUTH TWICE DAILY AS NEEDED FOR PALPITATIONS 360 tablet 3  . potassium chloride SA (KLOR-CON) 20 MEQ tablet Take 1 tablet (20 mEq total) by mouth daily. 90 tablet 3  . tiZANidine (ZANAFLEX) 4 MG tablet TAKE 1 TABLET BY MOUTH  EVERY 8 HOURS AS NEEDED FOR MUSCLE SPASM(S) 270 tablet 0  . traMADol (ULTRAM) 50 MG tablet TAKE 1 TO 2 TABLETS BY MOUTH TWICE DAILY AS NEEDED 100 tablet 3  . traZODone (DESYREL) 50 MG tablet TAKE 1/2 TO 1 TABLETS BY  MOUTH AT BEDTIME AS NEEDED  FOR SLEEP. 90 tablet 3  . triamcinolone ointment (KENALOG) 0.1 % Apply 1 application topically 2 (two) times daily as needed (rash). 80 g 0   No facility-administered medications prior to visit.    ROS: Review of Systems  Constitutional: Negative for appetite change, fatigue and unexpected weight change.  HENT: Negative for congestion, nosebleeds, sneezing, sore throat and trouble swallowing.   Eyes: Negative for itching and visual disturbance.  Respiratory: Negative for cough.   Cardiovascular: Negative for chest pain, palpitations and leg swelling.  Gastrointestinal: Negative for  abdominal distention, blood in stool, diarrhea and nausea.  Genitourinary: Negative for frequency and hematuria.  Musculoskeletal: Positive for arthralgias. Negative for back pain, gait problem, joint swelling and neck pain.  Skin: Negative for rash.  Neurological: Negative for dizziness, tremors, speech difficulty and weakness.  Psychiatric/Behavioral: Negative for agitation, dysphoric mood and sleep disturbance. The patient is not nervous/anxious.     Objective:  BP (!) 142/78 (BP Location: Left Arm, Patient Position: Sitting, Cuff Size: Large)   Pulse (!) 39   Temp (!) 97.2 F (36.2 C) (Temporal)   Ht 6\' 4"  (1.93 m)   Wt 225 lb 3.2 oz (102.2 kg)   SpO2 99%   BMI 27.41 kg/m   BP Readings from Last 3 Encounters:  07/30/20 (!) 142/78  04/26/20 138/88  12/22/19 122/76    Wt Readings from Last 3 Encounters:  07/30/20 225 lb 3.2 oz (102.2 kg)  04/26/20 219 lb (99.3 kg)  12/22/19 219 lb (99.3 kg)    Physical Exam Constitutional:      General: He is not in acute distress.    Appearance: He is well-developed.     Comments: NAD  Eyes:     Conjunctiva/sclera: Conjunctivae normal.     Pupils: Pupils are equal, round, and reactive to light.  Neck:     Thyroid: No thyromegaly.     Vascular: No  JVD.  Cardiovascular:     Rate and Rhythm: Normal rate and regular rhythm.     Heart sounds: Normal heart sounds. No murmur heard.  No friction rub. No gallop.   Pulmonary:     Effort: Pulmonary effort is normal. No respiratory distress.     Breath sounds: Normal breath sounds. No wheezing or rales.  Chest:     Chest wall: No tenderness.  Abdominal:     General: Bowel sounds are normal. There is no distension.     Palpations: Abdomen is soft. There is no mass.     Tenderness: There is no abdominal tenderness. There is no guarding or rebound.  Musculoskeletal:        General: Tenderness present. Normal range of motion.     Cervical back: Normal range of motion.  Lymphadenopathy:      Cervical: No cervical adenopathy.  Skin:    General: Skin is warm and dry.     Findings: No rash.  Neurological:     Mental Status: He is alert and oriented to person, place, and time.     Cranial Nerves: No cranial nerve deficit.     Motor: No abnormal muscle tone.     Coordination: Coordination normal.     Gait: Gait normal.     Deep Tendon Reflexes: Reflexes are normal and symmetric.  Psychiatric:        Behavior: Behavior normal.        Thought Content: Thought content normal.        Judgment: Judgment normal.     Lab Results  Component Value Date   WBC 4.9 08/29/2019   HGB 15.6 08/29/2019   HCT 45.8 08/29/2019   PLT 188.0 08/29/2019   GLUCOSE 84 04/26/2020   CHOL 175 08/29/2019   TRIG 104.0 08/29/2019   HDL 35.50 (L) 08/29/2019   LDLDIRECT 88.1 01/23/2011   LDLCALC 119 (H) 08/29/2019   ALT 4 08/29/2019   AST 9 08/29/2019   NA 141 04/26/2020   K 4.3 04/26/2020   CL 107 04/26/2020   CREATININE 0.96 04/26/2020   BUN 11 04/26/2020   CO2 28 04/26/2020   TSH 0.87 08/29/2019   PSA 0.29 08/29/2019   HGBA1C 5.4 04/26/2020   MICROALBUR <0.2 04/26/2020    No results found.  Assessment & Plan:    Follow-up: No follow-ups on file.  Walker Kehr, MD

## 2020-07-30 NOTE — Assessment & Plan Note (Signed)
PSA

## 2020-07-30 NOTE — Assessment & Plan Note (Signed)

## 2020-07-30 NOTE — Addendum Note (Signed)
Addended by: Rolm Baptise on: 07/30/2020 08:41 AM   Modules accepted: Orders

## 2020-07-30 NOTE — Addendum Note (Signed)
Addended by: Cresenciano Lick on: 07/30/2020 08:30 AM   Modules accepted: Orders

## 2020-08-03 ENCOUNTER — Inpatient Hospital Stay (HOSPITAL_COMMUNITY)
Admission: EM | Admit: 2020-08-03 | Discharge: 2020-08-09 | DRG: 231 | Disposition: A | Discharge status: Home / Self Care | Payer: Medicare Other | Attending: Cardiothoracic Surgery | Admitting: Cardiothoracic Surgery

## 2020-08-03 ENCOUNTER — Emergency Department (HOSPITAL_COMMUNITY): Payer: Medicare Other

## 2020-08-03 ENCOUNTER — Inpatient Hospital Stay (HOSPITAL_COMMUNITY)
Admission: EM | Disposition: A | Discharge status: Home / Self Care | Payer: Self-pay | Source: Home / Self Care | Attending: Cardiothoracic Surgery

## 2020-08-03 ENCOUNTER — Other Ambulatory Visit: Payer: Self-pay

## 2020-08-03 DIAGNOSIS — E785 Hyperlipidemia, unspecified: Secondary | ICD-10-CM | POA: Diagnosis not present

## 2020-08-03 DIAGNOSIS — I471 Supraventricular tachycardia: Secondary | ICD-10-CM | POA: Diagnosis not present

## 2020-08-03 DIAGNOSIS — Z79899 Other long term (current) drug therapy: Secondary | ICD-10-CM

## 2020-08-03 DIAGNOSIS — R079 Chest pain, unspecified: Secondary | ICD-10-CM | POA: Diagnosis not present

## 2020-08-03 DIAGNOSIS — D696 Thrombocytopenia, unspecified: Secondary | ICD-10-CM | POA: Diagnosis not present

## 2020-08-03 DIAGNOSIS — F17210 Nicotine dependence, cigarettes, uncomplicated: Secondary | ICD-10-CM | POA: Diagnosis present

## 2020-08-03 DIAGNOSIS — I213 ST elevation (STEMI) myocardial infarction of unspecified site: Secondary | ICD-10-CM | POA: Diagnosis not present

## 2020-08-03 DIAGNOSIS — I4901 Ventricular fibrillation: Secondary | ICD-10-CM | POA: Diagnosis not present

## 2020-08-03 DIAGNOSIS — Z9861 Coronary angioplasty status: Secondary | ICD-10-CM

## 2020-08-03 DIAGNOSIS — I2510 Atherosclerotic heart disease of native coronary artery without angina pectoris: Secondary | ICD-10-CM | POA: Diagnosis not present

## 2020-08-03 DIAGNOSIS — R6889 Other general symptoms and signs: Secondary | ICD-10-CM | POA: Diagnosis not present

## 2020-08-03 DIAGNOSIS — I2119 ST elevation (STEMI) myocardial infarction involving other coronary artery of inferior wall: Secondary | ICD-10-CM | POA: Diagnosis not present

## 2020-08-03 DIAGNOSIS — I509 Heart failure, unspecified: Secondary | ICD-10-CM

## 2020-08-03 DIAGNOSIS — Z885 Allergy status to narcotic agent status: Secondary | ICD-10-CM

## 2020-08-03 DIAGNOSIS — Z20822 Contact with and (suspected) exposure to covid-19: Secondary | ICD-10-CM | POA: Diagnosis not present

## 2020-08-03 DIAGNOSIS — I442 Atrioventricular block, complete: Secondary | ICD-10-CM | POA: Diagnosis present

## 2020-08-03 DIAGNOSIS — J9811 Atelectasis: Secondary | ICD-10-CM | POA: Diagnosis not present

## 2020-08-03 DIAGNOSIS — I2111 ST elevation (STEMI) myocardial infarction involving right coronary artery: Secondary | ICD-10-CM | POA: Diagnosis not present

## 2020-08-03 DIAGNOSIS — I959 Hypotension, unspecified: Secondary | ICD-10-CM | POA: Diagnosis not present

## 2020-08-03 DIAGNOSIS — G4700 Insomnia, unspecified: Secondary | ICD-10-CM | POA: Diagnosis present

## 2020-08-03 DIAGNOSIS — Z7982 Long term (current) use of aspirin: Secondary | ICD-10-CM | POA: Diagnosis not present

## 2020-08-03 DIAGNOSIS — Z8249 Family history of ischemic heart disease and other diseases of the circulatory system: Secondary | ICD-10-CM | POA: Diagnosis not present

## 2020-08-03 DIAGNOSIS — Z7984 Long term (current) use of oral hypoglycemic drugs: Secondary | ICD-10-CM

## 2020-08-03 DIAGNOSIS — E7800 Pure hypercholesterolemia, unspecified: Secondary | ICD-10-CM | POA: Diagnosis not present

## 2020-08-03 DIAGNOSIS — I480 Paroxysmal atrial fibrillation: Secondary | ICD-10-CM | POA: Diagnosis not present

## 2020-08-03 DIAGNOSIS — Z791 Long term (current) use of non-steroidal anti-inflammatories (NSAID): Secondary | ICD-10-CM | POA: Diagnosis not present

## 2020-08-03 DIAGNOSIS — I088 Other rheumatic multiple valve diseases: Secondary | ICD-10-CM | POA: Diagnosis not present

## 2020-08-03 DIAGNOSIS — I10 Essential (primary) hypertension: Secondary | ICD-10-CM

## 2020-08-03 DIAGNOSIS — J939 Pneumothorax, unspecified: Secondary | ICD-10-CM | POA: Diagnosis not present

## 2020-08-03 DIAGNOSIS — D62 Acute posthemorrhagic anemia: Secondary | ICD-10-CM | POA: Diagnosis not present

## 2020-08-03 DIAGNOSIS — Z833 Family history of diabetes mellitus: Secondary | ICD-10-CM

## 2020-08-03 DIAGNOSIS — E119 Type 2 diabetes mellitus without complications: Secondary | ICD-10-CM

## 2020-08-03 DIAGNOSIS — Z96652 Presence of left artificial knee joint: Secondary | ICD-10-CM | POA: Diagnosis present

## 2020-08-03 DIAGNOSIS — I25110 Atherosclerotic heart disease of native coronary artery with unstable angina pectoris: Secondary | ICD-10-CM | POA: Diagnosis not present

## 2020-08-03 DIAGNOSIS — Z01810 Encounter for preprocedural cardiovascular examination: Secondary | ICD-10-CM | POA: Diagnosis not present

## 2020-08-03 DIAGNOSIS — Z743 Need for continuous supervision: Secondary | ICD-10-CM | POA: Diagnosis not present

## 2020-08-03 DIAGNOSIS — Z951 Presence of aortocoronary bypass graft: Secondary | ICD-10-CM

## 2020-08-03 DIAGNOSIS — Z9889 Other specified postprocedural states: Secondary | ICD-10-CM

## 2020-08-03 DIAGNOSIS — I499 Cardiac arrhythmia, unspecified: Secondary | ICD-10-CM | POA: Diagnosis not present

## 2020-08-03 DIAGNOSIS — R918 Other nonspecific abnormal finding of lung field: Secondary | ICD-10-CM | POA: Diagnosis not present

## 2020-08-03 HISTORY — PX: CORONARY/GRAFT ACUTE MI REVASCULARIZATION: CATH118305

## 2020-08-03 HISTORY — PX: LEFT HEART CATH AND CORONARY ANGIOGRAPHY: CATH118249

## 2020-08-03 HISTORY — DX: ST elevation (STEMI) myocardial infarction involving right coronary artery: I21.11

## 2020-08-03 LAB — CBC WITH DIFFERENTIAL/PLATELET
Abs Immature Granulocytes: 0.02 10*3/uL (ref 0.00–0.07)
Basophils Absolute: 0.1 10*3/uL (ref 0.0–0.1)
Basophils Relative: 1 %
Eosinophils Absolute: 0.5 10*3/uL (ref 0.0–0.5)
Eosinophils Relative: 6 %
HCT: 44.5 % (ref 39.0–52.0)
Hemoglobin: 14.9 g/dL (ref 13.0–17.0)
Immature Granulocytes: 0 %
Lymphocytes Relative: 54 %
Lymphs Abs: 4.2 10*3/uL — ABNORMAL HIGH (ref 0.7–4.0)
MCH: 30.4 pg (ref 26.0–34.0)
MCHC: 33.5 g/dL (ref 30.0–36.0)
MCV: 90.8 fL (ref 80.0–100.0)
Monocytes Absolute: 0.8 10*3/uL (ref 0.1–1.0)
Monocytes Relative: 11 %
Neutro Abs: 2.2 10*3/uL (ref 1.7–7.7)
Neutrophils Relative %: 28 %
Platelets: 194 10*3/uL (ref 150–400)
RBC: 4.9 MIL/uL (ref 4.22–5.81)
RDW: 12.5 % (ref 11.5–15.5)
WBC: 7.8 10*3/uL (ref 4.0–10.5)
nRBC: 0 % (ref 0.0–0.2)

## 2020-08-03 LAB — APTT: aPTT: 26 seconds (ref 24–36)

## 2020-08-03 LAB — COMPREHENSIVE METABOLIC PANEL
ALT: 10 U/L (ref 0–44)
AST: 20 U/L (ref 15–41)
Albumin: 3.6 g/dL (ref 3.5–5.0)
Alkaline Phosphatase: 58 U/L (ref 38–126)
Anion gap: 9 (ref 5–15)
BUN: 10 mg/dL (ref 8–23)
CO2: 23 mmol/L (ref 22–32)
Calcium: 9.1 mg/dL (ref 8.9–10.3)
Chloride: 108 mmol/L (ref 98–111)
Creatinine, Ser: 1.28 mg/dL — ABNORMAL HIGH (ref 0.61–1.24)
GFR, Estimated: >60 mL/min (ref >60)
Glucose, Bld: 123 mg/dL — ABNORMAL HIGH (ref 70–99)
Potassium: 3.4 mmol/L — ABNORMAL LOW (ref 3.5–5.1)
Sodium: 140 mmol/L (ref 135–145)
Total Bilirubin: 0.8 mg/dL (ref 0.3–1.2)
Total Protein: 6.4 g/dL — ABNORMAL LOW (ref 6.5–8.1)

## 2020-08-03 LAB — LIPID PANEL
Cholesterol: 172 mg/dL (ref 0–200)
HDL: 35 mg/dL — ABNORMAL LOW (ref >40)
LDL Cholesterol: 114 mg/dL — ABNORMAL HIGH (ref 0–99)
Total CHOL/HDL Ratio: 4.9 RATIO
Triglycerides: 115 mg/dL (ref <150)
VLDL: 23 mg/dL (ref 0–40)

## 2020-08-03 LAB — PROTIME-INR
INR: 1.1 (ref 0.8–1.2)
Prothrombin Time: 13.3 seconds (ref 11.4–15.2)

## 2020-08-03 LAB — TROPONIN I (HIGH SENSITIVITY): Troponin I (High Sensitivity): 25 ng/L — ABNORMAL HIGH (ref <18)

## 2020-08-03 LAB — ABO/RH: ABO/RH(D): AB POS

## 2020-08-03 SURGERY — CORONARY/GRAFT ACUTE MI REVASCULARIZATION
Anesthesia: LOCAL

## 2020-08-03 MED ORDER — NITROGLYCERIN 1 MG/10 ML FOR IR/CATH LAB
INTRA_ARTERIAL | Status: AC
  Filled 2020-08-03: qty 10

## 2020-08-03 MED ORDER — VERAPAMIL HCL 2.5 MG/ML IV SOLN
INTRAVENOUS | Status: DC | PRN
  Administered 2020-08-03: 10 mL via INTRA_ARTERIAL

## 2020-08-03 MED ORDER — NOREPINEPHRINE 4 MG/250ML-% IV SOLN
INTRAVENOUS | Status: AC
  Filled 2020-08-03: qty 250

## 2020-08-03 MED ORDER — LIDOCAINE HCL (CARDIAC) PF 100 MG/5ML IV SOSY
PREFILLED_SYRINGE | INTRAVENOUS | Status: AC
  Filled 2020-08-03: qty 5

## 2020-08-03 MED ORDER — IOHEXOL 350 MG/ML SOLN
INTRAVENOUS | Status: AC
  Filled 2020-08-03: qty 1

## 2020-08-03 MED ORDER — POTASSIUM CHLORIDE CRYS ER 20 MEQ PO TBCR
20.0000 meq | EXTENDED_RELEASE_TABLET | Freq: Every day | ORAL | 1 refills | Status: DC
Start: 2020-08-03 — End: 2020-08-17

## 2020-08-03 MED ORDER — VERAPAMIL HCL 2.5 MG/ML IV SOLN
INTRAVENOUS | Status: AC
  Filled 2020-08-03: qty 2

## 2020-08-03 MED ORDER — SODIUM CHLORIDE 0.9 % IV SOLN
INTRAVENOUS | Status: DC
  Administered 2020-08-03: 20 mL/h via INTRAVENOUS

## 2020-08-03 MED ORDER — TIROFIBAN HCL IN NACL 5-0.9 MG/100ML-% IV SOLN
INTRAVENOUS | Status: AC | PRN
  Administered 2020-08-03: 0.15 ug/kg/min via INTRAVENOUS

## 2020-08-03 MED ORDER — LIDOCAINE HCL (PF) 1 % IJ SOLN
INTRAMUSCULAR | Status: DC | PRN
  Administered 2020-08-03: 2 mL
  Administered 2020-08-03: 8 mL

## 2020-08-03 MED ORDER — HEPARIN SODIUM (PORCINE) 5000 UNIT/ML IJ SOLN
4000.0000 [IU] | Freq: Once | INTRAMUSCULAR | Status: AC
  Administered 2020-08-03: 4000 [IU] via INTRAVENOUS
  Filled 2020-08-03: qty 1

## 2020-08-03 MED ORDER — MIDAZOLAM HCL 2 MG/2ML IJ SOLN
INTRAMUSCULAR | Status: AC
  Filled 2020-08-03: qty 2

## 2020-08-03 MED ORDER — MIDAZOLAM HCL 2 MG/2ML IJ SOLN
INTRAMUSCULAR | Status: DC | PRN
  Administered 2020-08-03 (×2): 1 mg via INTRAVENOUS

## 2020-08-03 MED ORDER — HEPARIN SODIUM (PORCINE) 1000 UNIT/ML IJ SOLN
INTRAMUSCULAR | Status: DC | PRN
  Administered 2020-08-03: 6000 [IU] via INTRAVENOUS

## 2020-08-03 MED ORDER — FENTANYL CITRATE (PF) 100 MCG/2ML IJ SOLN
INTRAMUSCULAR | Status: DC | PRN
  Administered 2020-08-03 (×2): 25 ug via INTRAVENOUS

## 2020-08-03 MED ORDER — AMIODARONE HCL IN DEXTROSE 360-4.14 MG/200ML-% IV SOLN
INTRAVENOUS | Status: AC | PRN
  Administered 2020-08-03: 60 mg/h via INTRAVENOUS

## 2020-08-03 MED ORDER — AMIODARONE HCL IN DEXTROSE 360-4.14 MG/200ML-% IV SOLN
INTRAVENOUS | Status: AC
  Filled 2020-08-03: qty 200

## 2020-08-03 MED ORDER — FENTANYL CITRATE (PF) 100 MCG/2ML IJ SOLN
INTRAMUSCULAR | Status: AC
  Filled 2020-08-03: qty 2

## 2020-08-03 MED ORDER — HEPARIN (PORCINE) IN NACL 1000-0.9 UT/500ML-% IV SOLN
INTRAVENOUS | Status: DC | PRN
  Administered 2020-08-03: 500 mL

## 2020-08-03 MED ORDER — LIDOCAINE HCL (PF) 1 % IJ SOLN
INTRAMUSCULAR | Status: AC
  Filled 2020-08-03: qty 30

## 2020-08-03 MED ORDER — TIROFIBAN (AGGRASTAT) BOLUS VIA INFUSION
INTRAVENOUS | Status: DC | PRN
  Administered 2020-08-03: 2550 ug via INTRAVENOUS

## 2020-08-03 MED ORDER — FENTANYL CITRATE (PF) 100 MCG/2ML IJ SOLN
50.0000 ug | Freq: Once | INTRAMUSCULAR | Status: AC
  Administered 2020-08-03: 50 ug via INTRAVENOUS
  Filled 2020-08-03: qty 2

## 2020-08-03 MED ORDER — IOHEXOL 350 MG/ML SOLN
INTRAVENOUS | Status: DC | PRN
  Administered 2020-08-03: 115 mL

## 2020-08-03 MED ORDER — HEPARIN (PORCINE) IN NACL 1000-0.9 UT/500ML-% IV SOLN
INTRAVENOUS | Status: AC
  Filled 2020-08-03: qty 1000

## 2020-08-03 MED ORDER — LIDOCAINE HCL (CARDIAC) PF 100 MG/5ML IV SOSY
PREFILLED_SYRINGE | INTRAVENOUS | Status: DC | PRN
  Administered 2020-08-03: 100 mg via INTRAVENOUS

## 2020-08-03 MED ORDER — AMIODARONE HCL 150 MG/3ML IV SOLN
INTRAVENOUS | Status: AC
  Filled 2020-08-03: qty 3

## 2020-08-03 MED ORDER — TIROFIBAN HCL IN NACL 5-0.9 MG/100ML-% IV SOLN
INTRAVENOUS | Status: AC
  Filled 2020-08-03: qty 100

## 2020-08-03 MED ORDER — AMIODARONE HCL 150 MG/3ML IV SOLN
INTRAVENOUS | Status: DC | PRN
  Administered 2020-08-03: 150 mg via INTRAVENOUS

## 2020-08-03 SURGICAL SUPPLY — 20 items
BALLN IABP SENSA PLUS 8F 50CC (BALLOONS) ×2
BALLN SAPPHIRE 2.5X15 (BALLOONS) ×2
BALLOON IABP SENS PLUS 8F 50CC (BALLOONS) IMPLANT
BALLOON SAPPHIRE 2.5X15 (BALLOONS) IMPLANT
CATH 5FR JL3.5 JR4 ANG PIG MP (CATHETERS) ×1 IMPLANT
CATH LAUNCHER 6FR JR4 (CATHETERS) ×1 IMPLANT
DEVICE RAD COMP TR BAND LRG (VASCULAR PRODUCTS) ×1 IMPLANT
GLIDESHEATH SLEND SS 6F .021 (SHEATH) ×1 IMPLANT
GUIDEWIRE INQWIRE 1.5J.035X260 (WIRE) IMPLANT
INQWIRE 1.5J .035X260CM (WIRE) ×2
KIT ENCORE 26 ADVANTAGE (KITS) ×1 IMPLANT
KIT HEART LEFT (KITS) ×2 IMPLANT
KIT MICROPUNCTURE NIT STIFF (SHEATH) ×1 IMPLANT
PACK CARDIAC CATHETERIZATION (CUSTOM PROCEDURE TRAY) ×2 IMPLANT
SHEATH PROBE COVER 6X72 (BAG) ×1 IMPLANT
SYR MEDRAD MARK 7 150ML (SYRINGE) ×2 IMPLANT
TRANSDUCER W/STOPCOCK (MISCELLANEOUS) ×2 IMPLANT
TUBING CIL FLEX 10 FLL-RA (TUBING) ×2 IMPLANT
WIRE COUGAR XT STRL 190CM (WIRE) ×1 IMPLANT
WIRE MICROINTRODUCER 60CM (WIRE) ×1 IMPLANT

## 2020-08-03 NOTE — H&P (Signed)
Cardiology History & Physical    Patient ID: Brian Mccullough MRN: [id number], DOB/AGE: [birth year]   Admit date: 08/03/2020  Primary Physician: Cassandria Anger, MD Primary Cardiologist: No primary care provider on file.  Patient Profile    This is a 69 year old male with a history of hypertension and diabetes who developed chest pain an hour prior to presentation to the ED and was found to have STEMI on admission.  History of Present Illness    This is a 69 year old male without significant prior cardiovascular history but with comorbidities of hypertension and diabetes who was in his usual state of health until around 930 this evening when he developed acute chest pain.  He waited about 45 minutes but given unrelenting chest pain he called EMS.  He was found to have a inferior STEMI on EKG in the field.  He received aspirin in route as well as nitroglycerin.  He had an isolated episode of hypotension to 80 mmHg systolic but this was short-lived.  Despite 3 sublingual nitroglycerin total over a period of 20 minutes he was unable to relieve his chest pain.  ECG at presentation with probable third-degree AV block with junctional escape rhythm.  QRS difficult to assess given his quite acute elevated inferior ST segments.  Coronary angiography showed acute occlusion of his early mid RCA.  Is a large system supplying the inferior and lateral walls.  He required cardioversion for intraprocedural VF.  Subsequent angiography of the left system shows early obstructive disease of the ostial LAD, ostial ramus, and ostial circumflex.  He underwent balloon angioplasty of the culprit and discussed with CTS with plan for urgent CABG for full revascularization given the extent of his other obstructive disease.  IABP placed post angioplasty in anticipation of CABG.  Past Medical History   Past Medical History:  Diagnosis Date  . Allergy   . Hyperactive gag reflex   . Hypertension    under control  with meds., has been on med. x 30 yr.  . Inguinal hernia 01/2014   bilateral  . LBP (low back pain)   . Non-insulin dependent type 2 diabetes mellitus ([place])   . OA (osteoarthritis) of knee 01/2004   left  . Palpitations   . Vitamin B 12 deficiency     Past Surgical History:  Procedure Laterality Date  . INGUINAL HERNIA REPAIR  age 1-5  . INGUINAL HERNIA REPAIR Bilateral 02/09/2014   Procedure: OPEN BILATERAL INGUINAL HERNIA REPAIR WITH MESH;  Surgeon: [name], MD;  Location: [facility];  Service: General;  Laterality: Bilateral;  . INSERTION OF MESH Bilateral 02/09/2014   Procedure: INSERTION OF MESH;  Surgeon: [name], MD;  Location: [facility];  Service: General;  Laterality: Bilateral;  . KNEE ARTHROSCOPY Right   . ORCHIECTOMY Left 05/31/2017   Procedure: LEFT RADICAL ORCHIECTOMY;  Surgeon: Irine Seal, MD;  Location[facility];  Service: Urology;  Laterality: Left;  . TONSILLECTOMY AND ADENOIDECTOMY  1960  . TOTAL KNEE ARTHROPLASTY Left 01/14/2004     Allergies Allergies  Allergen Reactions  . Codeine Nausea Only    Home Medications    Prior to Admission medications   Medication Sig Start Date End Date Taking? Authorizing Provider  aspirin 81 MG EC tablet Take 81 mg by mouth daily.      [provider]  benazepril (LOTENSIN) 20 MG tablet Take 1 tablet (20 mg total) by mouth daily. 07/30/20   [name], Tyrone Apple  V, MD  Cholecalciferol (VITAMIN D3) 1000 UNITS tablet Take 1,000 Units by mouth daily.     [provider]  cyanocobalamin 100 MCG tablet Take 1,000 mcg by mouth daily.     [provider]  meloxicam (MOBIC) 15 MG tablet Take 1 tablet (15 mg total) by mouth daily as needed. for pain 04/26/20   [name], Evie Lacks, MD  metFORMIN (GLUCOPHAGE) 500 MG tablet Take 1 tablet (500 mg total) by mouth daily with breakfast. 07/30/20   [name], Evie Lacks, MD  metoprolol tartrate (LOPRESSOR) 25  MG tablet TAKE 1 TO 2 TABLETS BY  MOUTH TWICE DAILY AS NEEDED FOR PALPITATIONS 07/30/20   [name], Evie Lacks, MD  potassium chloride SA (KLOR-CON) 20 MEQ tablet Take 1 tablet (20 mEq total) by mouth daily. 08/03/20   Josue Hector, MD  tiZANidine (ZANAFLEX) 4 MG tablet TAKE 1 TABLET BY MOUTH  EVERY 8 HOURS AS NEEDED FOR MUSCLE SPASM(S) 07/30/20   [name], Evie Lacks, MD  traMADol (ULTRAM) 50 MG tablet TAKE 1 TO 2 TABLETS BY MOUTH TWICE DAILY AS NEEDED 07/30/20   [name], Evie Lacks, MD  traZODone (DESYREL) 50 MG tablet TAKE 1/2 TO 1 TABLETS BY  MOUTH AT BEDTIME AS NEEDED  FOR SLEEP. 07/30/20   [name], Evie Lacks, MD  triamcinolone ointment (KENALOG) 0.1 % Apply 1 application topically 2 (two) times daily as needed (rash). 08/25/19   [name], Evie Lacks, MD    Family History    Family History  Problem Relation Age of Onset  . Hypertension Mother   . Diabetes Mother   . Diabetes Father   . Colon cancer Neg Hx   . Esophageal cancer Neg Hx   . Rectal cancer Neg Hx   . Stomach cancer Neg Hx    He indicated that his mother is deceased. He indicated that his father is deceased. He indicated that the status of his neg hx is unknown.   Social History    Social History   Socioeconomic History  . Marital status: Married    Spouse name: Not on file  . Number of children: Not on file  . Years of education: Not on file  . Highest education level: Not on file  Occupational History  . Occupation: Corporate investment banker: UNEMPLOYED    Comment: due to knees  Tobacco Use  . Smoking status: Current Every Day Smoker    Years: 22.00    Types: Cigars  . Smokeless tobacco: Never Used  . Tobacco comment: 2-3 small cigars/day  Vaping Use  . Vaping Use: Never used  Substance and Sexual Activity  . Alcohol use: No  . Drug use: No  . Sexual activity: Yes  Other Topics Concern  . Not on file  Social History Narrative  . Not on file   Social Determinants of Health   Financial Resource  Strain: Low Risk   . Difficulty of Paying Living Expenses: Not hard at all  Food Insecurity: No Food Insecurity  . Worried About Charity fundraiser in the Last Year: Never true  . Ran Out of Food in the Last Year: Never true  Transportation Needs: No Transportation Needs  . Lack of Transportation (Medical): No  . Lack of Transportation (Non-Medical): No  Physical Activity: Sufficiently Active  . Days of Exercise per Week: 5 days  . Minutes of Exercise per Session: 30 min  Stress: No Stress Concern Present  . Feeling of Stress : Not at all  Social  Connections: Socially Integrated  . Frequency of Communication with Friends and Family: More than three times a week  . Frequency of Social Gatherings with Friends and Family: More than three times a week  . Attends Religious Services: More than 4 times per year  . Active Member of Clubs or Organizations: Yes  . Attends Archivist Meetings: More than 4 times per year  . Marital Status: Married  Human resources officer Violence:   . Fear of Current or Ex-Partner: Not on file  . Emotionally Abused: Not on file  . Physically Abused: Not on file  . Sexually Abused: Not on file     Review of Systems    General:  No chills, fever, night sweats or weight changes.  Cardiovascular:  No chest pain, dyspnea on exertion, edema, orthopnea, palpitations, paroxysmal nocturnal dyspnea. Dermatological: No rash, lesions/masses Respiratory: No cough, dyspnea Urologic: No hematuria, dysuria Abdominal:   No nausea, vomiting, diarrhea, bright red blood per rectum, melena, or hematemesis Neurologic:  No visual changes, wkns, changes in mental status. All other systems reviewed and are otherwise negative except as noted above.  Physical Exam    BP 112/73   Pulse (!) 45   Temp (!) 97.4 F (36.3 C) (Oral)   Resp 11   Ht 6\' 4"  (1.93 m)   Wt 102 kg   SpO2 100%   BMI 27.37 kg/m  General: Alert, NAD HEENT: Normal  Neck: No bruits cannon A  waves. Lungs:  Resp regular and unlabored, CTA bilaterally. Heart: Regular rhythm, no s3, s4, or murmurs. Abdomen: Soft, non-tender, non-distended, BS +.  Extremities: Warm. No clubbing, cyanosis or edema. DP/PT/Radials 2+ and equal bilaterally. Psych: Normal affect. Neuro: Alert and oriented. No gross focal deficits. No abnormal movements.  Labs    Troponin (Point of Care Test) No results for input(s): TROPIPOC in the last 72 hours. No results for input(s): CKTOTAL, CKMB, TROPONINI in the last 72 hours. Lab Results  Component Value Date   WBC 5.1 07/30/2020   HGB 15.2 07/30/2020   HCT 45.3 07/30/2020   MCV 90.2 07/30/2020   PLT 172.0 07/30/2020    Recent Labs  Lab 07/30/20 0830  NA 141  K 4.5  CL 106  CO2 30  BUN 11  CREATININE 0.93  CALCIUM 9.4  PROT 6.6  BILITOT 0.5  ALKPHOS 62  ALT 6  AST 11  GLUCOSE 78   Lab Results  Component Value Date   CHOL 179 07/30/2020   HDL 36.50 (L) 07/30/2020   LDLCALC 116 (H) 07/30/2020   TRIG 133.0 07/30/2020   No results found for: [facility]   Radiology Studies    DG Chest Port 1 View  Result Date: 08/03/2020 CLINICAL DATA:  Chest pain EXAM: PORTABLE CHEST 1 VIEW COMPARISON:  05/31/2007 FINDINGS: The heart size and mediastinal contours are within normal limits. Both lungs are clear. The visualized skeletal structures are unremarkable. IMPRESSION: No active disease. Electronically Signed   By: Donavan Foil M.D.   On: 08/03/2020 22:31    ECG & Cardiac Imaging   ECG per HPI but with probable complete heart block with ventricular escape rhythm.  Marked ST elevations in the inferior leads.  Assessment & Plan    This is a 69 year old male with history of hypertension and diabetes presenting with inferior ST elevation myocardial infarction.  He underwent balloon angioplasty of culprit RCA but given atherosclerotic burden of proximal left -sided vasculature deferred stenting with plan for urgent CABG.  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 History of hypertension Type 2 diabetes not on insulin Insomnia   Plan ST elevation MI with RCA - Aspirin 81 mg indefinitely - Referral for urgent CABG; IABP 1:1 in place - ASA/clopidogrel 1 year form CABG ([facility]URE) - Transthoracic echo - High intensity statin indefinitely  Four-vessel obstructive coronary artery disease - Plan for urgent CABG after  Ventricular fibrillation complicating STEMI - Amiodarone infusion and bolus  T2DM not on insulin -Hold home Metformin -Sliding scale insulin -Favor SGLT2 inhibitor at discharge  Hypertension -Hold home benazepril -Start metoprolol 12.5 mg tartrate twice daily -Follow-up echo to guide agent Blanch Media  Nutrition: N.p.o. at midnight for potential staged revascularization DVT ppx: Lovenox GI ppx: Start PPI on DAPT Advanced Care Planning: Full code  Signed, Delight Hoh, MD 08/03/2020, 10:42 PM

## 2020-08-03 NOTE — ED Provider Notes (Signed)
[facility] EMERGENCY DEPARTMENT Provider Note   CSN: [id number] Arrival date & time: 08/03/20  2204     History Chief Complaint  Patient presents with   Code STEMI    LEVEL 5 CAVEAT 2/2 ACUITY OF CONDITION  Brian Mccullough is a 69 y.o. male.  69 year old male with a history of hypertension, diabetes, tobacco use (smokes Black and milds) presents to the emergency department as a code STEMI.  He began having chest pain 1 hour prior to arrival.  Pain has been constant, substernal, nonradiating.  He states that it feels like indigestion.  He has had associated diaphoresis.  No nausea, vomiting, shortness of breath, syncope.  He was given 324 mg aspirin in route by EMS.  Reports compliance with his daily medications without prior history of coronary intervention.  The history is provided by the patient. No language interpreter was used.       Past Medical History:  Diagnosis Date   Allergy    Hyperactive gag reflex    Hypertension    under control with meds., has been on med. x 30 yr.   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   Stress at home 03/27/2016   Bladder neck obstruction 05/29/2014   Caries 05/29/2014   Acute bronchitis 04/19/2014   Traveler's diarrhea 02/02/2014   Right inguinal hernia 01/13/2014   Recurrent left inguinal hernia 01/13/2014   Rt groin pain 12/30/2013   Palpitations 06/19/2013   Right groin pain 03/27/2013   Well adult exam 10/07/2012   Hypogonadism male 05/29/2012   Erectile dysfunction 05/27/2012   Insomnia 05/29/2011   WEIGHT GAIN,  ABNORMAL 05/30/2010   Dyslipidemia 01/31/2010   CHEST PAIN 01/31/2010   Groin pain, chronic, left 11/02/2009   LOW BACK PAIN 11/02/2009   DIZZINESS 03/02/2009   B12 deficiency 11/02/2008   ECZEMA 11/02/2008   Vitamin D deficiency 01/27/2008   Obesity 01/27/2008   ANXIETY 10/28/2007   ALCOHOLISM 10/28/2007   Osteoarthritis 10/28/2007   Diabetes mellitus type 2, controlled ([place]) 05/10/2007   Essential hypertension 05/10/2007    Past Surgical History:  Procedure Laterality Date   INGUINAL HERNIA REPAIR  age [age]   INGUINAL HERNIA REPAIR Bilateral 02/09/2014   Procedure: OPEN BILATERAL INGUINAL HERNIA REPAIR WITH MESH;  Surgeon: [name], MD;  Location: [place];  Service: General;  Laterality: Bilateral;   INSERTION OF MESH Bilateral 02/09/2014   Procedure: INSERTION OF MESH;  Surgeon: [name], MD;  Location: [place];  Service: General;  Laterality: Bilateral;   KNEE ARTHROSCOPY Right    ORCHIECTOMY Left 05/31/2017   Procedure: LEFT RADICAL ORCHIECTOMY;  Surgeon: Irine Seal, MD;  Location[facility];  Service: Urology;  Laterality: Left;   TONSILLECTOMY AND ADENOIDECTOMY  1960   TOTAL KNEE ARTHROPLASTY Left 01/14/2004       Family History  Problem Relation Age of Onset   Hypertension Mother    Diabetes Mother    Diabetes Father    Colon cancer Neg Hx    Esophageal cancer Neg  Hx    Rectal cancer Neg Hx    Stomach cancer Neg Hx     Social History   Tobacco Use   Smoking status: Current Every Day Smoker    Years: 22.00    Types: Cigars   Smokeless tobacco: Never Used   Tobacco comment: 2-3 small cigars/day  Vaping Use   Vaping Use: Never used  Substance Use Topics   Alcohol use: No   Drug use: No    Home Medications Prior to Admission medications   Medication Sig Start Date End Date Taking? Authorizing Provider  aspirin 81 MG EC tablet Take 81 mg by mouth daily.       [provider]  benazepril (LOTENSIN) 20 MG tablet Take 1 tablet (20 mg total) by mouth daily. 07/30/20   [name], Evie Lacks, MD  Cholecalciferol (VITAMIN D3) 1000 UNITS tablet Take 1,000 Units by mouth daily.     [provider]  cyanocobalamin 100 MCG tablet Take 1,000 mcg by mouth daily.     [provider]  meloxicam (MOBIC) 15 MG tablet Take 1 tablet (15 mg total) by mouth daily as needed. for pain 04/26/20   [name], Evie Lacks, MD  metFORMIN (GLUCOPHAGE) 500 MG tablet Take 1 tablet (500 mg total) by mouth daily with breakfast. 07/30/20   [name], Evie Lacks, MD  metoprolol tartrate (LOPRESSOR) 25 MG tablet TAKE 1 TO 2 TABLETS BY  MOUTH TWICE DAILY AS NEEDED FOR PALPITATIONS 07/30/20   [name], Evie Lacks, MD  potassium chloride SA (KLOR-CON) 20 MEQ tablet Take 1 tablet (20 mEq total) by mouth daily. 08/03/20   Josue Hector, MD  tiZANidine (ZANAFLEX) 4 MG tablet TAKE 1 TABLET BY MOUTH  EVERY 8 HOURS AS NEEDED FOR MUSCLE SPASM(S) 07/30/20   [name], Evie Lacks, MD  traMADol (ULTRAM) 50 MG tablet TAKE 1 TO 2 TABLETS BY MOUTH TWICE DAILY AS NEEDED 07/30/20   [name], Evie Lacks, MD  traZODone (DESYREL) 50 MG tablet TAKE 1/2 TO 1 TABLETS BY  MOUTH AT BEDTIME AS NEEDED  FOR SLEEP. 07/30/20   [name], Evie Lacks, MD  triamcinolone ointment (KENALOG) 0.1 % Apply 1 application topically 2 (two) times daily as needed (rash). 08/25/19   [name], Evie Lacks, MD    Allergies    Codeine  Review of Systems   Review of Systems  Unable to perform ROS: Acuity of condition     Physical Exam Updated Vital Signs BP 112/73    Pulse (!) 45    Temp (!) 97.4 F (36.3 C) (Oral)    Resp 11    Ht 6\' 4"  (1.93 m)    Wt 102 kg    SpO2 100%    BMI 27.37 kg/m   Physical Exam Vitals and nursing note reviewed.  Constitutional:      General: He is not in acute distress.    Appearance: He is well-developed. He is ill-appearing and diaphoretic.     Comments: Patient calm,  diaphoretic.  HENT:     Head: Normocephalic and atraumatic.  Eyes:     General: No scleral icterus.    Conjunctiva/sclera: Conjunctivae normal.  Cardiovascular:     Rate and Rhythm: Regular rhythm. Bradycardia present.     Pulses: Normal pulses.  Pulmonary:     Effort: Pulmonary effort is normal. No respiratory distress.     Breath sounds: No stridor. No wheezing or rales.     Comments: Lungs CTAB Abdominal:     Palpations: Abdomen is soft.  Musculoskeletal:        General: Normal range of motion.     Cervical back: Normal range of motion.     Comments: No significant BLE edema  Skin:    General: Skin is warm.     Coloration: Skin is not pale.     Findings: No erythema or rash.  Neurological:     Mental Status: He is alert and oriented to person, place, and time.  Psychiatric:        Behavior: Behavior normal.     ED Results / Procedures / Treatments   Labs (all labs ordered are listed, but only abnormal results are displayed) Labs Reviewed  RESPIRATORY PANEL BY RT PCR (FLU A&B, COVID)  HEMOGLOBIN A1C  CBC WITH DIFFERENTIAL/PLATELET  PROTIME-INR  APTT  COMPREHENSIVE METABOLIC PANEL  LIPID PANEL  TROPONIN I (HIGH SENSITIVITY)    EKG ED ECG REPORT   Date: 08/03/2020  Rate: 48  Rhythm: bradycardia  QRS Axis: normal  Intervals: normal  ST/T Wave abnormalities: ST elevations inferiorly, ST elevations anteriorly and acute myocardial infarction  Conduction Disutrbances:complete heart block  Narrative Interpretation: Third-degree AV block with nonspecific IVCD, anterior and inferior STEMI.  Old EKG Reviewed: none available  I have personally reviewed the EKG tracing and agree with the computerized printout as noted.   Radiology DG Chest Port 1 View  Result Date: 08/03/2020 CLINICAL DATA:  Chest pain EXAM: PORTABLE CHEST 1 VIEW COMPARISON:  05/31/2007 FINDINGS: The heart size and mediastinal contours are within normal limits. Both lungs are clear. The visualized  skeletal structures are unremarkable. IMPRESSION: No active disease. Electronically Signed   By: Donavan Foil M.D.   On: 08/03/2020 22:31    Procedures .Critical Care Performed by: Antonietta Breach, PA-C Authorized by: Antonietta Breach, PA-C   Critical care provider statement:    Critical care time (minutes):  45   Critical care was time spent personally by me on the following activities:  Discussions with consultants, evaluation of patient's response to treatment, examination of patient, ordering and performing treatments and interventions, ordering and review of laboratory studies, ordering and review of radiographic studies, pulse oximetry, re-evaluation of patient's condition, obtaining history from patient or surrogate and review of old charts   (including critical care time)  Medications Ordered in ED Medications  0.9 %  sodium chloride infusion (20 mL/hr Intravenous New Bag/Given 08/03/20 2218)  midazolam (VERSED) injection (1 mg Intravenous Given 08/03/20 2243)  fentaNYL (SUBLIMAZE) injection (25 mcg Intravenous Given 08/03/20 2243)  heparin injection 4,000 Units (4,000 Units Intravenous Given 08/03/20 2214)  fentaNYL (SUBLIMAZE) injection 50 mcg (50 mcg Intravenous Given 08/03/20 2226)    ED Course  I have reviewed the triage vital signs and the nursing notes.  Pertinent labs & imaging results that were available during my care of the patient were reviewed by me and considered in my medical decision making (see chart for details).  Clinical Course as of Aug 03 2244  Tue Aug 03, 2020  2229 Transported to Cath lab.   [KH]    Clinical Course User Index [KH] Beverely Pace   MDM Rules/Calculators/A&P                          69 year old male presenting to the ED as a code STEMI.  Also in third-degree heart block.  Pads applied for precaution.  Was given aspirin by EMS.  Heparin administered in the ED.  Also given fentanyl for pain.  Transferred to Cath Lab for PCI.   Final  Clinical Impression(s) / ED Diagnoses Final diagnoses:  ST elevation myocardial infarction (STEMI), unspecified artery [facility])    Rx / DC Orders ED Discharge Orders    None       Antonietta Breach, PA-C 08/03/20 2250    Dorie Rank, MD 08/04/20 [phone]

## 2020-08-03 NOTE — ED Triage Notes (Addendum)
Patient with chest pain that started about one hour ago, states that it felt like indigestion.  Patient is pale and diaphoretic at this time.  Patient denies any shortness of breath or nausea at this time.  Patient took 324mg  ASA before EMS arrival and patient was hypotensive en route to ED.

## 2020-08-03 NOTE — ED Notes (Signed)
Radiolucent pads placed.

## 2020-08-03 NOTE — ED Notes (Signed)
To Cath lab with RN, EMT and cardiologist.

## 2020-08-04 ENCOUNTER — Inpatient Hospital Stay (HOSPITAL_COMMUNITY): Payer: Medicare Other

## 2020-08-04 ENCOUNTER — Inpatient Hospital Stay (HOSPITAL_COMMUNITY)
Admission: EM | Disposition: A | Discharge status: Home / Self Care | Payer: Self-pay | Source: Home / Self Care | Attending: Cardiothoracic Surgery

## 2020-08-04 ENCOUNTER — Inpatient Hospital Stay (HOSPITAL_COMMUNITY): Payer: Medicare Other | Admitting: Anesthesiology

## 2020-08-04 ENCOUNTER — Encounter (HOSPITAL_COMMUNITY): Payer: Self-pay | Admitting: Cardiovascular Disease

## 2020-08-04 ENCOUNTER — Other Ambulatory Visit: Payer: Self-pay

## 2020-08-04 DIAGNOSIS — I10 Essential (primary) hypertension: Secondary | ICD-10-CM | POA: Diagnosis not present

## 2020-08-04 DIAGNOSIS — E7800 Pure hypercholesterolemia, unspecified: Secondary | ICD-10-CM | POA: Diagnosis not present

## 2020-08-04 DIAGNOSIS — Z01810 Encounter for preprocedural cardiovascular examination: Secondary | ICD-10-CM | POA: Diagnosis not present

## 2020-08-04 DIAGNOSIS — I2111 ST elevation (STEMI) myocardial infarction involving right coronary artery: Secondary | ICD-10-CM | POA: Diagnosis not present

## 2020-08-04 DIAGNOSIS — I213 ST elevation (STEMI) myocardial infarction of unspecified site: Secondary | ICD-10-CM

## 2020-08-04 DIAGNOSIS — I25110 Atherosclerotic heart disease of native coronary artery with unstable angina pectoris: Secondary | ICD-10-CM

## 2020-08-04 DIAGNOSIS — Z951 Presence of aortocoronary bypass graft: Secondary | ICD-10-CM

## 2020-08-04 DIAGNOSIS — R079 Chest pain, unspecified: Secondary | ICD-10-CM

## 2020-08-04 HISTORY — PX: ENDOVEIN HARVEST OF GREATER SAPHENOUS VEIN: SHX5059

## 2020-08-04 HISTORY — PX: CORONARY ARTERY BYPASS GRAFT: SHX141

## 2020-08-04 HISTORY — PX: RADIAL ARTERY HARVEST: SHX5067

## 2020-08-04 HISTORY — PX: TEE WITHOUT CARDIOVERSION: SHX5443

## 2020-08-04 LAB — POCT I-STAT, CHEM 8
BUN: 10 mg/dL (ref 8–23)
BUN: 10 mg/dL (ref 8–23)
BUN: 12 mg/dL (ref 8–23)
BUN: 11 mg/dL (ref 8–23)
BUN: 11 mg/dL (ref 8–23)
Calcium, Ion: 1.28 mmol/L (ref 1.15–1.40)
Calcium, Ion: 1.22 mmol/L (ref 1.15–1.40)
Calcium, Ion: 1.23 mmol/L (ref 1.15–1.40)
Calcium, Ion: 1.15 mmol/L (ref 1.15–1.40)
Calcium, Ion: 1.15 mmol/L (ref 1.15–1.40)
Chloride: 105 mmol/L (ref 98–111)
Chloride: 106 mmol/L (ref 98–111)
Chloride: 106 mmol/L (ref 98–111)
Chloride: 105 mmol/L (ref 98–111)
Chloride: 106 mmol/L (ref 98–111)
Creatinine, Ser: 0.6 mg/dL — ABNORMAL LOW (ref 0.61–1.24)
Creatinine, Ser: 0.7 mg/dL (ref 0.61–1.24)
Creatinine, Ser: 0.7 mg/dL (ref 0.61–1.24)
Creatinine, Ser: 0.7 mg/dL (ref 0.61–1.24)
Creatinine, Ser: 0.8 mg/dL (ref 0.61–1.24)
Glucose, Bld: 118 mg/dL — ABNORMAL HIGH (ref 70–99)
Glucose, Bld: 117 mg/dL — ABNORMAL HIGH (ref 70–99)
Glucose, Bld: 119 mg/dL — ABNORMAL HIGH (ref 70–99)
Glucose, Bld: 116 mg/dL — ABNORMAL HIGH (ref 70–99)
Glucose, Bld: 100 mg/dL — ABNORMAL HIGH (ref 70–99)
HCT: 42 % (ref 39.0–52.0)
HCT: 39 % (ref 39.0–52.0)
HCT: 39 % (ref 39.0–52.0)
HCT: 31 % — ABNORMAL LOW (ref 39.0–52.0)
HCT: 31 % — ABNORMAL LOW (ref 39.0–52.0)
Hemoglobin: 14.3 g/dL (ref 13.0–17.0)
Hemoglobin: 13.3 g/dL (ref 13.0–17.0)
Hemoglobin: 13.3 g/dL (ref 13.0–17.0)
Hemoglobin: 10.5 g/dL — ABNORMAL LOW (ref 13.0–17.0)
Hemoglobin: 10.5 g/dL — ABNORMAL LOW (ref 13.0–17.0)
Potassium: 3.6 mmol/L (ref 3.5–5.1)
Potassium: 3.9 mmol/L (ref 3.5–5.1)
Potassium: 4 mmol/L (ref 3.5–5.1)
Potassium: 4.8 mmol/L (ref 3.5–5.1)
Potassium: 4.2 mmol/L (ref 3.5–5.1)
Sodium: 142 mmol/L (ref 135–145)
Sodium: 141 mmol/L (ref 135–145)
Sodium: 142 mmol/L (ref 135–145)
Sodium: 139 mmol/L (ref 135–145)
Sodium: 142 mmol/L (ref 135–145)
TCO2: 24 mmol/L (ref 22–32)
TCO2: 22 mmol/L (ref 22–32)
TCO2: 25 mmol/L (ref 22–32)
TCO2: 25 mmol/L (ref 22–32)
TCO2: 22 mmol/L (ref 22–32)

## 2020-08-04 LAB — COMPREHENSIVE METABOLIC PANEL
ALT: 13 U/L (ref 0–44)
AST: 47 U/L — ABNORMAL HIGH (ref 15–41)
Albumin: 3.6 g/dL (ref 3.5–5.0)
Alkaline Phosphatase: 50 U/L (ref 38–126)
Anion gap: 8 (ref 5–15)
BUN: 11 mg/dL (ref 8–23)
CO2: 25 mmol/L (ref 22–32)
Calcium: 9.2 mg/dL (ref 8.9–10.3)
Chloride: 107 mmol/L (ref 98–111)
Creatinine, Ser: 1.13 mg/dL (ref 0.61–1.24)
GFR, Estimated: >60 mL/min (ref >60)
Glucose, Bld: 108 mg/dL — ABNORMAL HIGH (ref 70–99)
Potassium: 4 mmol/L (ref 3.5–5.1)
Sodium: 140 mmol/L (ref 135–145)
Total Bilirubin: 0.8 mg/dL (ref 0.3–1.2)
Total Protein: 6.2 g/dL — ABNORMAL LOW (ref 6.5–8.1)

## 2020-08-04 LAB — POCT I-STAT 7, (LYTES, BLD GAS, ICA,H+H)
Acid-base deficit: 1 mmol/L (ref 0.0–2.0)
Acid-base deficit: 4 mmol/L — ABNORMAL HIGH (ref 0.0–2.0)
Acid-base deficit: 1 mmol/L (ref 0.0–2.0)
Acid-base deficit: 3 mmol/L — ABNORMAL HIGH (ref 0.0–2.0)
Acid-base deficit: 3 mmol/L — ABNORMAL HIGH (ref 0.0–2.0)
Acid-base deficit: 5 mmol/L — ABNORMAL HIGH (ref 0.0–2.0)
Acid-base deficit: 5 mmol/L — ABNORMAL HIGH (ref 0.0–2.0)
Acid-base deficit: 4 mmol/L — ABNORMAL HIGH (ref 0.0–2.0)
Bicarbonate: 23.7 mmol/L (ref 20.0–28.0)
Bicarbonate: 21.4 mmol/L (ref 20.0–28.0)
Bicarbonate: 24.7 mmol/L (ref 20.0–28.0)
Bicarbonate: 22.1 mmol/L (ref 20.0–28.0)
Bicarbonate: 22.6 mmol/L (ref 20.0–28.0)
Bicarbonate: 20.8 mmol/L (ref 20.0–28.0)
Bicarbonate: 20.6 mmol/L (ref 20.0–28.0)
Bicarbonate: 21.3 mmol/L (ref 20.0–28.0)
Calcium, Ion: 1.22 mmol/L (ref 1.15–1.40)
Calcium, Ion: 1.08 mmol/L — ABNORMAL LOW (ref 1.15–1.40)
Calcium, Ion: 1.14 mmol/L — ABNORMAL LOW (ref 1.15–1.40)
Calcium, Ion: 1.12 mmol/L — ABNORMAL LOW (ref 1.15–1.40)
Calcium, Ion: 1.17 mmol/L (ref 1.15–1.40)
Calcium, Ion: 1.16 mmol/L (ref 1.15–1.40)
Calcium, Ion: 1.2 mmol/L (ref 1.15–1.40)
Calcium, Ion: 1.21 mmol/L (ref 1.15–1.40)
HCT: 41 % (ref 39.0–52.0)
HCT: 33 % — ABNORMAL LOW (ref 39.0–52.0)
HCT: 32 % — ABNORMAL LOW (ref 39.0–52.0)
HCT: 32 % — ABNORMAL LOW (ref 39.0–52.0)
HCT: 31 % — ABNORMAL LOW (ref 39.0–52.0)
HCT: 31 % — ABNORMAL LOW (ref 39.0–52.0)
HCT: 33 % — ABNORMAL LOW (ref 39.0–52.0)
HCT: 34 % — ABNORMAL LOW (ref 39.0–52.0)
Hemoglobin: 13.9 g/dL (ref 13.0–17.0)
Hemoglobin: 11.2 g/dL — ABNORMAL LOW (ref 13.0–17.0)
Hemoglobin: 10.9 g/dL — ABNORMAL LOW (ref 13.0–17.0)
Hemoglobin: 10.9 g/dL — ABNORMAL LOW (ref 13.0–17.0)
Hemoglobin: 10.5 g/dL — ABNORMAL LOW (ref 13.0–17.0)
Hemoglobin: 10.5 g/dL — ABNORMAL LOW (ref 13.0–17.0)
Hemoglobin: 11.2 g/dL — ABNORMAL LOW (ref 13.0–17.0)
Hemoglobin: 11.6 g/dL — ABNORMAL LOW (ref 13.0–17.0)
O2 Saturation: 100 %
O2 Saturation: 100 %
O2 Saturation: 100 %
O2 Saturation: 99 %
O2 Saturation: 99 %
O2 Saturation: 99 %
O2 Saturation: 99 %
O2 Saturation: 99 %
Patient temperature: 35.7
Patient temperature: 36.8
Patient temperature: 37.2
Patient temperature: 37.2
Potassium: 3.5 mmol/L (ref 3.5–5.1)
Potassium: 4.2 mmol/L (ref 3.5–5.1)
Potassium: 4.5 mmol/L (ref 3.5–5.1)
Potassium: 4.2 mmol/L (ref 3.5–5.1)
Potassium: 4.1 mmol/L (ref 3.5–5.1)
Potassium: 4.1 mmol/L (ref 3.5–5.1)
Potassium: 4.3 mmol/L (ref 3.5–5.1)
Potassium: 4.3 mmol/L (ref 3.5–5.1)
Sodium: 141 mmol/L (ref 135–145)
Sodium: 141 mmol/L (ref 135–145)
Sodium: 138 mmol/L (ref 135–145)
Sodium: 141 mmol/L (ref 135–145)
Sodium: 143 mmol/L (ref 135–145)
Sodium: 144 mmol/L (ref 135–145)
Sodium: 143 mmol/L (ref 135–145)
Sodium: 143 mmol/L (ref 135–145)
TCO2: 25 mmol/L (ref 22–32)
TCO2: 23 mmol/L (ref 22–32)
TCO2: 26 mmol/L (ref 22–32)
TCO2: 23 mmol/L (ref 22–32)
TCO2: 24 mmol/L (ref 22–32)
TCO2: 22 mmol/L (ref 22–32)
TCO2: 22 mmol/L (ref 22–32)
TCO2: 22 mmol/L (ref 22–32)
pCO2 arterial: 39.5 mmHg (ref 32.0–48.0)
pCO2 arterial: 39.9 mmHg (ref 32.0–48.0)
pCO2 arterial: 42.2 mmHg (ref 32.0–48.0)
pCO2 arterial: 39.1 mmHg (ref 32.0–48.0)
pCO2 arterial: 41.2 mmHg (ref 32.0–48.0)
pCO2 arterial: 38 mmHg (ref 32.0–48.0)
pCO2 arterial: 41.3 mmHg (ref 32.0–48.0)
pCO2 arterial: 41.1 mmHg (ref 32.0–48.0)
pH, Arterial: 7.386 (ref 7.350–7.450)
pH, Arterial: 7.338 — ABNORMAL LOW (ref 7.350–7.450)
pH, Arterial: 7.376 (ref 7.350–7.450)
pH, Arterial: 7.36 (ref 7.350–7.450)
pH, Arterial: 7.341 — ABNORMAL LOW (ref 7.350–7.450)
pH, Arterial: 7.345 — ABNORMAL LOW (ref 7.350–7.450)
pH, Arterial: 7.307 — ABNORMAL LOW (ref 7.350–7.450)
pH, Arterial: 7.323 — ABNORMAL LOW (ref 7.350–7.450)
pO2, Arterial: 274 mmHg — ABNORMAL HIGH (ref 83.0–108.0)
pO2, Arterial: 330 mmHg — ABNORMAL HIGH (ref 83.0–108.0)
pO2, Arterial: 369 mmHg — ABNORMAL HIGH (ref 83.0–108.0)
pO2, Arterial: 148 mmHg — ABNORMAL HIGH (ref 83.0–108.0)
pO2, Arterial: 168 mmHg — ABNORMAL HIGH (ref 83.0–108.0)
pO2, Arterial: 151 mmHg — ABNORMAL HIGH (ref 83.0–108.0)
pO2, Arterial: 158 mmHg — ABNORMAL HIGH (ref 83.0–108.0)
pO2, Arterial: 132 mmHg — ABNORMAL HIGH (ref 83.0–108.0)

## 2020-08-04 LAB — BASIC METABOLIC PANEL
Anion gap: 12 (ref 5–15)
Anion gap: 6 (ref 5–15)
BUN: 10 mg/dL (ref 8–23)
BUN: 12 mg/dL (ref 8–23)
CO2: 20 mmol/L — ABNORMAL LOW (ref 22–32)
CO2: 21 mmol/L — ABNORMAL LOW (ref 22–32)
Calcium: 9 mg/dL (ref 8.9–10.3)
Calcium: 8.2 mg/dL — ABNORMAL LOW (ref 8.9–10.3)
Chloride: 108 mmol/L (ref 98–111)
Chloride: 113 mmol/L — ABNORMAL HIGH (ref 98–111)
Creatinine, Ser: 1.02 mg/dL (ref 0.61–1.24)
Creatinine, Ser: 0.85 mg/dL (ref 0.61–1.24)
GFR, Estimated: >60 mL/min (ref >60)
GFR, Estimated: >60 mL/min (ref >60)
Glucose, Bld: 109 mg/dL — ABNORMAL HIGH (ref 70–99)
Glucose, Bld: 110 mg/dL — ABNORMAL HIGH (ref 70–99)
Potassium: 3.8 mmol/L (ref 3.5–5.1)
Potassium: 4.3 mmol/L (ref 3.5–5.1)
Sodium: 140 mmol/L (ref 135–145)
Sodium: 140 mmol/L (ref 135–145)

## 2020-08-04 LAB — CBC WITH DIFFERENTIAL/PLATELET
Abs Immature Granulocytes: 0.03 10*3/uL (ref 0.00–0.07)
Abs Immature Granulocytes: 0.02 10*3/uL (ref 0.00–0.07)
Basophils Absolute: 0.1 10*3/uL (ref 0.0–0.1)
Basophils Absolute: 0 10*3/uL (ref 0.0–0.1)
Basophils Relative: 1 %
Basophils Relative: 0 %
Eosinophils Absolute: 0.1 10*3/uL (ref 0.0–0.5)
Eosinophils Absolute: 0.1 10*3/uL (ref 0.0–0.5)
Eosinophils Relative: 1 %
Eosinophils Relative: 1 %
HCT: 46 % (ref 39.0–52.0)
HCT: 42.9 % (ref 39.0–52.0)
Hemoglobin: 15 g/dL (ref 13.0–17.0)
Hemoglobin: 14.4 g/dL (ref 13.0–17.0)
Immature Granulocytes: 0 %
Immature Granulocytes: 0 %
Lymphocytes Relative: 17 %
Lymphocytes Relative: 15 %
Lymphs Abs: 1.4 10*3/uL (ref 0.7–4.0)
Lymphs Abs: 1.2 10*3/uL (ref 0.7–4.0)
MCH: 29.9 pg (ref 26.0–34.0)
MCH: 30.2 pg (ref 26.0–34.0)
MCHC: 32.6 g/dL (ref 30.0–36.0)
MCHC: 33.6 g/dL (ref 30.0–36.0)
MCV: 91.6 fL (ref 80.0–100.0)
MCV: 89.9 fL (ref 80.0–100.0)
Monocytes Absolute: 0.5 10*3/uL (ref 0.1–1.0)
Monocytes Absolute: 0.6 10*3/uL (ref 0.1–1.0)
Monocytes Relative: 7 %
Monocytes Relative: 7 %
Neutro Abs: 5.8 10*3/uL (ref 1.7–7.7)
Neutro Abs: 6.2 10*3/uL (ref 1.7–7.7)
Neutrophils Relative %: 74 %
Neutrophils Relative %: 77 %
Platelets: 171 10*3/uL (ref 150–400)
Platelets: 164 10*3/uL (ref 150–400)
RBC: 5.02 MIL/uL (ref 4.22–5.81)
RBC: 4.77 MIL/uL (ref 4.22–5.81)
RDW: 12.7 % (ref 11.5–15.5)
RDW: 12.4 % (ref 11.5–15.5)
WBC: 7.9 10*3/uL (ref 4.0–10.5)
WBC: 8 10*3/uL (ref 4.0–10.5)
nRBC: 0 % (ref 0.0–0.2)
nRBC: 0 % (ref 0.0–0.2)

## 2020-08-04 LAB — LIPID PANEL
Cholesterol: 171 mg/dL (ref 0–200)
HDL: 37 mg/dL — ABNORMAL LOW (ref >40)
LDL Cholesterol: 120 mg/dL — ABNORMAL HIGH (ref 0–99)
Total CHOL/HDL Ratio: 4.6 RATIO
Triglycerides: 68 mg/dL (ref <150)
VLDL: 14 mg/dL (ref 0–40)

## 2020-08-04 LAB — ECHOCARDIOGRAM COMPLETE
Area-P 1/2: 2.91 cm2
Height: 76 in
S' Lateral: 3.7 cm
Weight: 3686.09 oz

## 2020-08-04 LAB — PROTIME-INR
INR: 1.2 (ref 0.8–1.2)
INR: 1.1 (ref 0.8–1.2)
INR: 1.5 — ABNORMAL HIGH (ref 0.8–1.2)
Prothrombin Time: 14.9 seconds (ref 11.4–15.2)
Prothrombin Time: 13.8 seconds (ref 11.4–15.2)
Prothrombin Time: 17.5 seconds — ABNORMAL HIGH (ref 11.4–15.2)

## 2020-08-04 LAB — TROPONIN I (HIGH SENSITIVITY)
Troponin I (High Sensitivity): 8846 ng/L (ref <18)
Troponin I (High Sensitivity): 20723 ng/L (ref <18)

## 2020-08-04 LAB — CBC
HCT: 33.1 % — ABNORMAL LOW (ref 39.0–52.0)
HCT: 35.8 % — ABNORMAL LOW (ref 39.0–52.0)
Hemoglobin: 11 g/dL — ABNORMAL LOW (ref 13.0–17.0)
Hemoglobin: 11.7 g/dL — ABNORMAL LOW (ref 13.0–17.0)
MCH: 30.3 pg (ref 26.0–34.0)
MCH: 30.2 pg (ref 26.0–34.0)
MCHC: 33.2 g/dL (ref 30.0–36.0)
MCHC: 32.7 g/dL (ref 30.0–36.0)
MCV: 91.2 fL (ref 80.0–100.0)
MCV: 92.3 fL (ref 80.0–100.0)
Platelets: 105 10*3/uL — ABNORMAL LOW (ref 150–400)
Platelets: 120 10*3/uL — ABNORMAL LOW (ref 150–400)
RBC: 3.63 MIL/uL — ABNORMAL LOW (ref 4.22–5.81)
RBC: 3.88 MIL/uL — ABNORMAL LOW (ref 4.22–5.81)
RDW: 12.6 % (ref 11.5–15.5)
RDW: 13 % (ref 11.5–15.5)
WBC: 11.4 10*3/uL — ABNORMAL HIGH (ref 4.0–10.5)
WBC: 11.5 10*3/uL — ABNORMAL HIGH (ref 4.0–10.5)
nRBC: 0 % (ref 0.0–0.2)
nRBC: 0 % (ref 0.0–0.2)

## 2020-08-04 LAB — MAGNESIUM: Magnesium: 1.9 mg/dL (ref 1.7–2.4)

## 2020-08-04 LAB — GLUCOSE, CAPILLARY
Glucose-Capillary: 106 mg/dL — ABNORMAL HIGH (ref 70–99)
Glucose-Capillary: 103 mg/dL — ABNORMAL HIGH (ref 70–99)
Glucose-Capillary: 103 mg/dL — ABNORMAL HIGH (ref 70–99)
Glucose-Capillary: 89 mg/dL (ref 70–99)
Glucose-Capillary: 115 mg/dL — ABNORMAL HIGH (ref 70–99)
Glucose-Capillary: 119 mg/dL — ABNORMAL HIGH (ref 70–99)
Glucose-Capillary: 93 mg/dL (ref 70–99)
Glucose-Capillary: 118 mg/dL — ABNORMAL HIGH (ref 70–99)
Glucose-Capillary: 109 mg/dL — ABNORMAL HIGH (ref 70–99)
Glucose-Capillary: 128 mg/dL — ABNORMAL HIGH (ref 70–99)

## 2020-08-04 LAB — RESPIRATORY PANEL BY RT PCR (FLU A&B, COVID)
Influenza A by PCR: NEGATIVE
Influenza B by PCR: NEGATIVE
SARS Coronavirus 2 by RT PCR: NEGATIVE

## 2020-08-04 LAB — HEMOGLOBIN AND HEMATOCRIT, BLOOD
HCT: 32.1 % — ABNORMAL LOW (ref 39.0–52.0)
Hemoglobin: 11.1 g/dL — ABNORMAL LOW (ref 13.0–17.0)

## 2020-08-04 LAB — APTT
aPTT: 115 seconds — ABNORMAL HIGH (ref 24–36)
aPTT: 31 seconds (ref 24–36)

## 2020-08-04 LAB — POCT I-STAT EG7
Acid-Base Excess: 1 mmol/L (ref 0.0–2.0)
Bicarbonate: 27 mmol/L (ref 20.0–28.0)
Calcium, Ion: 1.1 mmol/L — ABNORMAL LOW (ref 1.15–1.40)
HCT: 33 % — ABNORMAL LOW (ref 39.0–52.0)
Hemoglobin: 11.2 g/dL — ABNORMAL LOW (ref 13.0–17.0)
O2 Saturation: 80 %
Potassium: 3.8 mmol/L (ref 3.5–5.1)
Sodium: 145 mmol/L (ref 135–145)
TCO2: 28 mmol/L (ref 22–32)
pCO2, Ven: 45.5 mmHg (ref 44.0–60.0)
pH, Ven: 7.38 (ref 7.250–7.430)
pO2, Ven: 45 mmHg (ref 32.0–45.0)

## 2020-08-04 LAB — TSH: TSH: 0.673 u[IU]/mL (ref 0.350–4.500)

## 2020-08-04 LAB — SURGICAL PCR SCREEN
MRSA, PCR: NEGATIVE
Staphylococcus aureus: NEGATIVE

## 2020-08-04 LAB — BRAIN NATRIURETIC PEPTIDE: B Natriuretic Peptide: 529 pg/mL — ABNORMAL HIGH (ref 0.0–100.0)

## 2020-08-04 LAB — HIV ANTIBODY (ROUTINE TESTING W REFLEX): HIV Screen 4th Generation wRfx: NONREACTIVE

## 2020-08-04 LAB — HEMOGLOBIN A1C
Hgb A1c MFr Bld: 5.4 % (ref 4.8–5.6)
Mean Plasma Glucose: 108 mg/dL

## 2020-08-04 LAB — POCT ACTIVATED CLOTTING TIME: Activated Clotting Time: 296 seconds

## 2020-08-04 LAB — MRSA PCR SCREENING: MRSA by PCR: NEGATIVE

## 2020-08-04 LAB — PLATELET COUNT: Platelets: 148 10*3/uL — ABNORMAL LOW (ref 150–400)

## 2020-08-04 SURGERY — CORONARY ARTERY BYPASS GRAFTING (CABG)
Anesthesia: General | Site: Chest | Laterality: Right

## 2020-08-04 MED ORDER — ROCURONIUM BROMIDE 10 MG/ML (PF) SYRINGE
PREFILLED_SYRINGE | INTRAVENOUS | Status: AC
  Filled 2020-08-04: qty 20

## 2020-08-04 MED ORDER — PLATELET RICH PLASMA OPTIME
Status: DC | PRN
  Administered 2020-08-04: 10 mL

## 2020-08-04 MED ORDER — NITROGLYCERIN 0.4 MG SL SUBL: 0.4000 mg | SUBLINGUAL_TABLET | SUBLINGUAL | Status: DC | PRN

## 2020-08-04 MED ORDER — PHENYLEPHRINE 40 MCG/ML (10ML) SYRINGE FOR IV PUSH (FOR BLOOD PRESSURE SUPPORT)
PREFILLED_SYRINGE | INTRAVENOUS | Status: AC
  Filled 2020-08-04: qty 10

## 2020-08-04 MED ORDER — POTASSIUM CHLORIDE 10 MEQ/50ML IV SOLN: 10.0000 meq | INTRAVENOUS | Status: AC

## 2020-08-04 MED ORDER — SODIUM CHLORIDE 0.9 % IV SOLN: INTRAVENOUS | Status: DC

## 2020-08-04 MED ORDER — MILRINONE LACTATE IN DEXTROSE 20-5 MG/100ML-% IV SOLN
0.3000 ug/kg/min | INTRAVENOUS | Status: DC
  Filled 2020-08-04: qty 100

## 2020-08-04 MED ORDER — METOPROLOL TARTRATE 5 MG/5ML IV SOLN
INTRAVENOUS | Status: DC | PRN
  Administered 2020-08-04: 2.5 mg via INTRAVENOUS

## 2020-08-04 MED ORDER — HYDRALAZINE HCL 20 MG/ML IJ SOLN
10.0000 mg | INTRAMUSCULAR | Status: DC | PRN
  Administered 2020-08-04: 10 mg via INTRAVENOUS
  Filled 2020-08-04: qty 1

## 2020-08-04 MED ORDER — ASPIRIN 81 MG PO CHEW: 81.0000 mg | CHEWABLE_TABLET | Freq: Every day | ORAL | Status: DC

## 2020-08-04 MED ORDER — SODIUM CHLORIDE 0.9% FLUSH: 3.0000 mL | INTRAVENOUS | Status: DC | PRN

## 2020-08-04 MED ORDER — 0.9 % SODIUM CHLORIDE (POUR BTL) OPTIME
TOPICAL | Status: DC | PRN
  Administered 2020-08-04: 5000 mL

## 2020-08-04 MED ORDER — FENTANYL CITRATE (PF) 250 MCG/5ML IJ SOLN
INTRAMUSCULAR | Status: AC
  Filled 2020-08-04: qty 5

## 2020-08-04 MED ORDER — ACETAMINOPHEN 325 MG PO TABS: 650.0000 mg | ORAL_TABLET | ORAL | Status: DC | PRN

## 2020-08-04 MED ORDER — LACTATED RINGERS IV SOLN: INTRAVENOUS | Status: DC

## 2020-08-04 MED ORDER — EPINEPHRINE HCL 5 MG/250ML IV SOLN IN NS
0.0000 ug/min | INTRAVENOUS | Status: DC
  Filled 2020-08-04: qty 250

## 2020-08-04 MED ORDER — ALBUMIN HUMAN 5 % IV SOLN: INTRAVENOUS | Status: DC | PRN

## 2020-08-04 MED ORDER — HEPARIN SODIUM (PORCINE) 1000 UNIT/ML IJ SOLN
INTRAMUSCULAR | Status: AC
  Filled 2020-08-04: qty 1

## 2020-08-04 MED ORDER — BUPIVACAINE LIPOSOME 1.3 % IJ SUSP
INTRAMUSCULAR | Status: DC | PRN
  Administered 2020-08-04: 50 mL

## 2020-08-04 MED ORDER — PLATELET POOR PLASMA OPTIME
Status: DC | PRN
  Administered 2020-08-04: 10 mL

## 2020-08-04 MED ORDER — MIDAZOLAM HCL (PF) 10 MG/2ML IJ SOLN
INTRAMUSCULAR | Status: AC
  Filled 2020-08-04: qty 2

## 2020-08-04 MED ORDER — TRANEXAMIC ACID 1000 MG/10ML IV SOLN
1.5000 mg/kg/h | INTRAVENOUS | Status: AC
  Administered 2020-08-04: 1.5 mg/kg/h via INTRAVENOUS
  Filled 2020-08-04: qty 10

## 2020-08-04 MED ORDER — PROTAMINE SULFATE 10 MG/ML IV SOLN
INTRAVENOUS | Status: AC
  Filled 2020-08-04: qty 50

## 2020-08-04 MED ORDER — PROTAMINE SULFATE 10 MG/ML IV SOLN
INTRAVENOUS | Status: DC | PRN
  Administered 2020-08-04: 350 mg via INTRAVENOUS
  Administered 2020-08-04: 20 mg via INTRAVENOUS

## 2020-08-04 MED ORDER — MIDAZOLAM HCL 2 MG/2ML IJ SOLN: 2.0000 mg | INTRAMUSCULAR | Status: DC | PRN

## 2020-08-04 MED ORDER — METOPROLOL TARTRATE 5 MG/5ML IV SOLN: 2.5000 mg | INTRAVENOUS | Status: DC | PRN

## 2020-08-04 MED ORDER — HEPARIN SODIUM (PORCINE) 1000 UNIT/ML IJ SOLN
INTRAMUSCULAR | Status: DC | PRN
  Administered 2020-08-04: 37000 [IU] via INTRAVENOUS

## 2020-08-04 MED ORDER — MORPHINE SULFATE (PF) 2 MG/ML IV SOLN: 2.0000 mg | INTRAVENOUS | Status: DC | PRN

## 2020-08-04 MED ORDER — SODIUM CHLORIDE 0.9% FLUSH
10.0000 mL | INTRAVENOUS | Status: DC | PRN
  Administered 2020-08-04: 20 mL

## 2020-08-04 MED ORDER — ONDANSETRON HCL 4 MG/2ML IJ SOLN: 4.0000 mg | Freq: Four times a day (QID) | INTRAMUSCULAR | Status: DC | PRN

## 2020-08-04 MED ORDER — SODIUM CHLORIDE 0.9 % IV SOLN
750.0000 mg | INTRAVENOUS | Status: AC
  Administered 2020-08-04: 750 mg via INTRAVENOUS
  Filled 2020-08-04 (×2): qty 750

## 2020-08-04 MED ORDER — ASPIRIN EC 325 MG PO TBEC
325.0000 mg | DELAYED_RELEASE_TABLET | Freq: Every day | ORAL | Status: DC
  Administered 2020-08-05: 325 mg via ORAL
  Filled 2020-08-04: qty 1

## 2020-08-04 MED ORDER — INSULIN REGULAR(HUMAN) IN NACL 100-0.9 UT/100ML-% IV SOLN
INTRAVENOUS | Status: AC
  Administered 2020-08-04: 2.6 [IU]/h via INTRAVENOUS
  Filled 2020-08-04: qty 100

## 2020-08-04 MED ORDER — PROPOFOL 10 MG/ML IV BOLUS
INTRAVENOUS | Status: AC
  Filled 2020-08-04: qty 20

## 2020-08-04 MED ORDER — FENTANYL CITRATE (PF) 250 MCG/5ML IJ SOLN
INTRAMUSCULAR | Status: AC
  Filled 2020-08-04: qty 25

## 2020-08-04 MED ORDER — VANCOMYCIN HCL 1000 MG IV SOLR
INTRAVENOUS | Status: DC | PRN
  Administered 2020-08-04: 3 g

## 2020-08-04 MED ORDER — METOPROLOL TARTRATE 12.5 MG HALF TABLET
12.5000 mg | ORAL_TABLET | Freq: Two times a day (BID) | ORAL | Status: DC
  Administered 2020-08-05 (×2): 12.5 mg via ORAL
  Filled 2020-08-04 (×2): qty 1

## 2020-08-04 MED ORDER — PANTOPRAZOLE SODIUM 40 MG PO TBEC
40.0000 mg | DELAYED_RELEASE_TABLET | Freq: Every day | ORAL | Status: DC
  Administered 2020-08-06 – 2020-08-09 (×4): 40 mg via ORAL
  Filled 2020-08-04 (×4): qty 1

## 2020-08-04 MED ORDER — SODIUM CHLORIDE 0.9% FLUSH
3.0000 mL | Freq: Two times a day (BID) | INTRAVENOUS | Status: DC
  Administered 2020-08-05 – 2020-08-08 (×8): 3 mL via INTRAVENOUS

## 2020-08-04 MED ORDER — VANCOMYCIN HCL IN DEXTROSE 1-5 GM/200ML-% IV SOLN
1000.0000 mg | Freq: Once | INTRAVENOUS | Status: AC
  Administered 2020-08-04: 1000 mg via INTRAVENOUS

## 2020-08-04 MED ORDER — ETOMIDATE 2 MG/ML IV SOLN
INTRAVENOUS | Status: AC
  Filled 2020-08-04: qty 10

## 2020-08-04 MED ORDER — TRAMADOL HCL 50 MG PO TABS
50.0000 mg | ORAL_TABLET | ORAL | Status: DC | PRN
  Administered 2020-08-05: 50 mg via ORAL
  Administered 2020-08-05 – 2020-08-06 (×3): 100 mg via ORAL
  Filled 2020-08-04 (×2): qty 2
  Filled 2020-08-04: qty 1
  Filled 2020-08-04: qty 2

## 2020-08-04 MED ORDER — METOPROLOL TARTRATE 5 MG/5ML IV SOLN
INTRAVENOUS | Status: AC
  Filled 2020-08-04: qty 5

## 2020-08-04 MED ORDER — LACTATED RINGERS IV SOLN: INTRAVENOUS | Status: DC | PRN

## 2020-08-04 MED ORDER — VANCOMYCIN HCL 1500 MG/300ML IV SOLN
1500.0000 mg | INTRAVENOUS | Status: AC
  Administered 2020-08-04: 1500 mg via INTRAVENOUS
  Filled 2020-08-04 (×2): qty 300

## 2020-08-04 MED ORDER — BUPIVACAINE LIPOSOME 1.3 % IJ SUSP
20.0000 mL | INTRAMUSCULAR | Status: DC
  Filled 2020-08-04 (×2): qty 20

## 2020-08-04 MED ORDER — DEXMEDETOMIDINE HCL IN NACL 400 MCG/100ML IV SOLN
0.1000 ug/kg/h | INTRAVENOUS | Status: DC
  Filled 2020-08-04: qty 100

## 2020-08-04 MED ORDER — AMIODARONE HCL IN DEXTROSE 360-4.14 MG/200ML-% IV SOLN
15.0000 mg/h | INTRAVENOUS | Status: DC
  Administered 2020-08-04: 15 mg/h via INTRAVENOUS
  Filled 2020-08-04: qty 200

## 2020-08-04 MED ORDER — SODIUM CHLORIDE 0.9 % IV SOLN
1.5000 g | INTRAVENOUS | Status: AC
  Administered 2020-08-04: 1.5 g via INTRAVENOUS
  Filled 2020-08-04: qty 1.5

## 2020-08-04 MED ORDER — HEPARIN (PORCINE) 25000 UT/250ML-% IV SOLN: 1500.0000 [IU]/h | INTRAVENOUS | Status: DC

## 2020-08-04 MED ORDER — CHLORHEXIDINE GLUCONATE 0.12 % MT SOLN: 15.0000 mL | Freq: Once | OROMUCOSAL | Status: DC

## 2020-08-04 MED ORDER — PROPOFOL 10 MG/ML IV BOLUS
INTRAVENOUS | Status: DC | PRN
  Administered 2020-08-04: 50 mg via INTRAVENOUS

## 2020-08-04 MED ORDER — TIROFIBAN HCL IN NACL 5-0.9 MG/100ML-% IV SOLN
0.1500 ug/kg/min | INTRAVENOUS | Status: DC
  Administered 2020-08-04 (×2): 0.15 ug/kg/min via INTRAVENOUS
  Filled 2020-08-04: qty 100

## 2020-08-04 MED ORDER — SODIUM CHLORIDE 0.9 % IV SOLN: 250.0000 mL | INTRAVENOUS | Status: DC | PRN

## 2020-08-04 MED ORDER — VANCOMYCIN HCL 1000 MG IV SOLR
INTRAVENOUS | Status: AC
  Filled 2020-08-04: qty 3000

## 2020-08-04 MED ORDER — BISACODYL 5 MG PO TBEC: 5.0000 mg | DELAYED_RELEASE_TABLET | Freq: Once | ORAL | Status: DC

## 2020-08-04 MED ORDER — NITROGLYCERIN IN D5W 200-5 MCG/ML-% IV SOLN: 0.0000 ug/min | INTRAVENOUS | Status: DC

## 2020-08-04 MED ORDER — PHENYLEPHRINE 40 MCG/ML (10ML) SYRINGE FOR IV PUSH (FOR BLOOD PRESSURE SUPPORT)
PREFILLED_SYRINGE | INTRAVENOUS | Status: DC | PRN
  Administered 2020-08-04 (×2): 80 ug via INTRAVENOUS
  Administered 2020-08-04 (×2): 120 ug via INTRAVENOUS

## 2020-08-04 MED ORDER — MUPIROCIN 2 % EX OINT: 1.0000 "application " | TOPICAL_OINTMENT | Freq: Two times a day (BID) | CUTANEOUS | Status: DC

## 2020-08-04 MED ORDER — BISACODYL 5 MG PO TBEC
10.0000 mg | DELAYED_RELEASE_TABLET | Freq: Every day | ORAL | Status: DC
  Administered 2020-08-05 – 2020-08-09 (×5): 10 mg via ORAL
  Filled 2020-08-04 (×5): qty 2

## 2020-08-04 MED ORDER — ACETAMINOPHEN 160 MG/5ML PO SOLN: 1000.0000 mg | Freq: Four times a day (QID) | ORAL | Status: DC

## 2020-08-04 MED ORDER — TRANEXAMIC ACID (OHS) BOLUS VIA INFUSION
15.0000 mg/kg | INTRAVENOUS | Status: AC
  Administered 2020-08-04: 1567.5 mg via INTRAVENOUS
  Filled 2020-08-04: qty 1568

## 2020-08-04 MED ORDER — ROCURONIUM BROMIDE 10 MG/ML (PF) SYRINGE
PREFILLED_SYRINGE | INTRAVENOUS | Status: AC
  Filled 2020-08-04: qty 10

## 2020-08-04 MED ORDER — BISACODYL 10 MG RE SUPP: 10.0000 mg | Freq: Every day | RECTAL | Status: DC

## 2020-08-04 MED ORDER — SODIUM CHLORIDE 0.9 % IV SOLN: 250.0000 mL | INTRAVENOUS | Status: DC

## 2020-08-04 MED ORDER — PLASMA-LYTE 148 IV SOLN
INTRAVENOUS | Status: AC
  Administered 2020-08-04: 500 mL
  Filled 2020-08-04: qty 2.5

## 2020-08-04 MED ORDER — NITROGLYCERIN IN D5W 200-5 MCG/ML-% IV SOLN
2.0000 ug/min | INTRAVENOUS | Status: AC
  Administered 2020-08-04: 5 ug/min via INTRAVENOUS
  Filled 2020-08-04: qty 250

## 2020-08-04 MED ORDER — METOPROLOL TARTRATE 25 MG/10 ML ORAL SUSPENSION: 12.5000 mg | Freq: Two times a day (BID) | ORAL | Status: DC

## 2020-08-04 MED ORDER — STERILE WATER FOR INJECTION IJ SOLN
INTRAMUSCULAR | Status: AC
  Filled 2020-08-04: qty 10

## 2020-08-04 MED ORDER — LABETALOL HCL 5 MG/ML IV SOLN
10.0000 mg | INTRAVENOUS | Status: DC | PRN
  Administered 2020-08-04 (×2): 10 mg via INTRAVENOUS
  Filled 2020-08-04: qty 4

## 2020-08-04 MED ORDER — SODIUM CHLORIDE 0.9 % IV SOLN
INTRAVENOUS | Status: DC
  Filled 2020-08-04: qty 30

## 2020-08-04 MED ORDER — ACETAMINOPHEN 500 MG PO TABS
1000.0000 mg | ORAL_TABLET | Freq: Four times a day (QID) | ORAL | Status: DC
  Administered 2020-08-04 – 2020-08-09 (×18): 1000 mg via ORAL
  Filled 2020-08-04 (×18): qty 2

## 2020-08-04 MED ORDER — MAGNESIUM SULFATE 4 GM/100ML IV SOLN
4.0000 g | Freq: Once | INTRAVENOUS | Status: AC
  Administered 2020-08-04: 4 g via INTRAVENOUS
  Filled 2020-08-04: qty 100

## 2020-08-04 MED ORDER — EPHEDRINE 5 MG/ML INJ
INTRAVENOUS | Status: AC
  Filled 2020-08-04: qty 10

## 2020-08-04 MED ORDER — INSULIN REGULAR(HUMAN) IN NACL 100-0.9 UT/100ML-% IV SOLN: INTRAVENOUS | Status: DC

## 2020-08-04 MED ORDER — ATORVASTATIN CALCIUM 80 MG PO TABS: 80.0000 mg | ORAL_TABLET | Freq: Every day | ORAL | Status: DC

## 2020-08-04 MED ORDER — SODIUM CHLORIDE 0.45 % IV SOLN: INTRAVENOUS | Status: DC | PRN

## 2020-08-04 MED ORDER — MIDAZOLAM HCL 2 MG/2ML IJ SOLN
INTRAMUSCULAR | Status: AC
  Filled 2020-08-04: qty 2

## 2020-08-04 MED ORDER — SODIUM CHLORIDE 0.9% FLUSH: 3.0000 mL | Freq: Two times a day (BID) | INTRAVENOUS | Status: DC

## 2020-08-04 MED ORDER — SODIUM CHLORIDE 0.9 % IV SOLN
1.5000 g | Freq: Two times a day (BID) | INTRAVENOUS | Status: AC
  Administered 2020-08-04 – 2020-08-06 (×4): 1.5 g via INTRAVENOUS
  Filled 2020-08-04 (×4): qty 1.5

## 2020-08-04 MED ORDER — EPHEDRINE SULFATE-NACL 50-0.9 MG/10ML-% IV SOSY
PREFILLED_SYRINGE | INTRAVENOUS | Status: DC | PRN
  Administered 2020-08-04: 10 mg via INTRAVENOUS

## 2020-08-04 MED ORDER — METOPROLOL TARTRATE 12.5 MG HALF TABLET: 12.5000 mg | ORAL_TABLET | Freq: Once | ORAL | Status: DC

## 2020-08-04 MED ORDER — ALBUMIN HUMAN 5 % IV SOLN
250.0000 mL | INTRAVENOUS | Status: AC | PRN
  Administered 2020-08-04 (×3): 12.5 g via INTRAVENOUS
  Filled 2020-08-04 (×2): qty 250

## 2020-08-04 MED ORDER — FENTANYL CITRATE (PF) 100 MCG/2ML IJ SOLN
25.0000 ug | INTRAMUSCULAR | Status: DC | PRN
  Administered 2020-08-05 (×2): 25 ug via INTRAVENOUS
  Filled 2020-08-04 (×2): qty 2

## 2020-08-04 MED ORDER — TEMAZEPAM 15 MG PO CAPS: 15.0000 mg | ORAL_CAPSULE | Freq: Once | ORAL | Status: DC | PRN

## 2020-08-04 MED ORDER — FAMOTIDINE IN NACL 20-0.9 MG/50ML-% IV SOLN
20.0000 mg | Freq: Two times a day (BID) | INTRAVENOUS | Status: DC
  Administered 2020-08-04: 20 mg via INTRAVENOUS

## 2020-08-04 MED ORDER — INSULIN ASPART 100 UNIT/ML ~~LOC~~ SOLN: 0.0000 [IU] | Freq: Every day | SUBCUTANEOUS | Status: DC

## 2020-08-04 MED ORDER — MAGNESIUM SULFATE 50 % IJ SOLN
40.0000 meq | INTRAMUSCULAR | Status: DC
  Filled 2020-08-04: qty 9.85

## 2020-08-04 MED ORDER — DEXMEDETOMIDINE HCL IN NACL 400 MCG/100ML IV SOLN
0.1000 ug/kg/h | INTRAVENOUS | Status: AC
  Administered 2020-08-04: .3 ug/kg/h via INTRAVENOUS
  Filled 2020-08-04: qty 100

## 2020-08-04 MED ORDER — TRANEXAMIC ACID (OHS) PUMP PRIME SOLUTION
2.0000 mg/kg | INTRAVENOUS | Status: DC
  Filled 2020-08-04: qty 2.09

## 2020-08-04 MED ORDER — ACETAMINOPHEN 160 MG/5ML PO SOLN: 650.0000 mg | Freq: Once | ORAL | Status: AC

## 2020-08-04 MED ORDER — CHLORHEXIDINE GLUCONATE CLOTH 2 % EX PADS
6.0000 | MEDICATED_PAD | Freq: Once | CUTANEOUS | Status: DC
  Administered 2020-08-04: 6 via TOPICAL

## 2020-08-04 MED ORDER — LACTATED RINGERS IV SOLN: 500.0000 mL | Freq: Once | INTRAVENOUS | Status: DC | PRN

## 2020-08-04 MED ORDER — FENTANYL CITRATE (PF) 250 MCG/5ML IJ SOLN
INTRAMUSCULAR | Status: DC | PRN
Start: 2020-08-04 — End: 2020-08-04
  Administered 2020-08-04: 200 ug via INTRAVENOUS
  Administered 2020-08-04: 50 ug via INTRAVENOUS
  Administered 2020-08-04: 100 ug via INTRAVENOUS
  Administered 2020-08-04: 150 ug via INTRAVENOUS
  Administered 2020-08-04: 100 ug via INTRAVENOUS
  Administered 2020-08-04: 50 ug via INTRAVENOUS
  Administered 2020-08-04: 150 ug via INTRAVENOUS
  Administered 2020-08-04: 100 ug via INTRAVENOUS
  Administered 2020-08-04: 150 ug via INTRAVENOUS
  Administered 2020-08-04: 100 ug via INTRAVENOUS
  Administered 2020-08-04: 150 ug via INTRAVENOUS

## 2020-08-04 MED ORDER — BUPIVACAINE HCL (PF) 0.5 % IJ SOLN
INTRAMUSCULAR | Status: AC
  Filled 2020-08-04: qty 30

## 2020-08-04 MED ORDER — ROCURONIUM BROMIDE 10 MG/ML (PF) SYRINGE
PREFILLED_SYRINGE | INTRAVENOUS | Status: DC | PRN
  Administered 2020-08-04: 50 mg via INTRAVENOUS
  Administered 2020-08-04: 40 mg via INTRAVENOUS
  Administered 2020-08-04: 60 mg via INTRAVENOUS
  Administered 2020-08-04: 100 mg via INTRAVENOUS
  Administered 2020-08-04: 50 mg via INTRAVENOUS

## 2020-08-04 MED ORDER — MIDAZOLAM HCL 5 MG/5ML IJ SOLN
INTRAMUSCULAR | Status: DC | PRN
  Administered 2020-08-04 (×3): 2 mg via INTRAVENOUS
  Administered 2020-08-04: 4 mg via INTRAVENOUS
  Administered 2020-08-04 (×2): 2 mg via INTRAVENOUS

## 2020-08-04 MED ORDER — CHLORHEXIDINE GLUCONATE 0.12 % MT SOLN
15.0000 mL | OROMUCOSAL | Status: AC
  Administered 2020-08-04: 15 mL via OROMUCOSAL

## 2020-08-04 MED ORDER — ACETAMINOPHEN 650 MG RE SUPP
650.0000 mg | Freq: Once | RECTAL | Status: AC
  Administered 2020-08-04: 650 mg via RECTAL

## 2020-08-04 MED ORDER — INSULIN ASPART 100 UNIT/ML ~~LOC~~ SOLN: 4.0000 [IU] | Freq: Three times a day (TID) | SUBCUTANEOUS | Status: DC

## 2020-08-04 MED ORDER — ASPIRIN EC 81 MG PO TBEC: 81.0000 mg | DELAYED_RELEASE_TABLET | Freq: Every day | ORAL | Status: DC

## 2020-08-04 MED ORDER — ASPIRIN 81 MG PO CHEW: 324.0000 mg | CHEWABLE_TABLET | Freq: Every day | ORAL | Status: DC

## 2020-08-04 MED ORDER — NOREPINEPHRINE 4 MG/250ML-% IV SOLN
0.0000 ug/min | INTRAVENOUS | Status: DC
  Filled 2020-08-04: qty 250

## 2020-08-04 MED ORDER — PHENYLEPHRINE HCL-NACL 20-0.9 MG/250ML-% IV SOLN
0.0000 ug/min | INTRAVENOUS | Status: DC
  Filled 2020-08-04: qty 250

## 2020-08-04 MED ORDER — SODIUM CHLORIDE 0.9% FLUSH
10.0000 mL | Freq: Two times a day (BID) | INTRAVENOUS | Status: DC
  Administered 2020-08-05 – 2020-08-08 (×5): 10 mL

## 2020-08-04 MED ORDER — CHLORHEXIDINE GLUCONATE CLOTH 2 % EX PADS
6.0000 | MEDICATED_PAD | Freq: Every day | CUTANEOUS | Status: DC
  Administered 2020-08-04 – 2020-08-08 (×4): 6 via TOPICAL

## 2020-08-04 MED ORDER — PHENYLEPHRINE HCL-NACL 20-0.9 MG/250ML-% IV SOLN
30.0000 ug/min | INTRAVENOUS | Status: AC
  Administered 2020-08-04: 15 ug/min via INTRAVENOUS
  Filled 2020-08-04: qty 250

## 2020-08-04 MED ORDER — DEXTROSE 50 % IV SOLN
0.0000 mL | INTRAVENOUS | Status: DC | PRN
  Administered 2020-08-05: 35 mL via INTRAVENOUS
  Filled 2020-08-04: qty 50

## 2020-08-04 MED ORDER — DEXMEDETOMIDINE HCL IN NACL 400 MCG/100ML IV SOLN: 0.0000 ug/kg/h | INTRAVENOUS | Status: DC

## 2020-08-04 MED ORDER — THIAMINE HCL 100 MG/ML IJ SOLN
Freq: Once | INTRAVENOUS | Status: AC
  Filled 2020-08-04: qty 1000

## 2020-08-04 MED ORDER — STERILE WATER FOR INJECTION IJ SOLN
INTRAMUSCULAR | Status: DC | PRN
  Administered 2020-08-04: 20 mL via SURGICAL_CAVITY

## 2020-08-04 MED ORDER — HEMOSTATIC AGENTS (NO CHARGE) OPTIME
TOPICAL | Status: DC | PRN
  Administered 2020-08-04 (×2): 1 via TOPICAL

## 2020-08-04 MED ORDER — CHLORHEXIDINE GLUCONATE CLOTH 2 % EX PADS: 6.0000 | MEDICATED_PAD | Freq: Once | CUTANEOUS | Status: DC

## 2020-08-04 MED ORDER — INSULIN ASPART 100 UNIT/ML ~~LOC~~ SOLN: 0.0000 [IU] | Freq: Three times a day (TID) | SUBCUTANEOUS | Status: DC

## 2020-08-04 MED ORDER — POTASSIUM CHLORIDE 2 MEQ/ML IV SOLN
80.0000 meq | INTRAVENOUS | Status: DC
  Filled 2020-08-04: qty 40

## 2020-08-04 MED ORDER — TRANEXAMIC ACID 1000 MG/10ML IV SOLN
1.5000 mg/kg/h | INTRAVENOUS | Status: DC
  Filled 2020-08-04: qty 25

## 2020-08-04 MED ORDER — DOCUSATE SODIUM 100 MG PO CAPS
200.0000 mg | ORAL_CAPSULE | Freq: Every day | ORAL | Status: DC
  Administered 2020-08-05 – 2020-08-09 (×5): 200 mg via ORAL
  Filled 2020-08-04 (×5): qty 2

## 2020-08-04 MED FILL — Heparin Sod (Porcine)-NaCl IV Soln 1000 Unit/500ML-0.9%: INTRAVENOUS | Qty: 500 | Status: AC

## 2020-08-04 MED FILL — Lidocaine HCl Local Soln Prefilled Syringe 100 MG/5ML (2%): INTRAMUSCULAR | Qty: 5 | Status: CN

## 2020-08-04 MED FILL — Nitroglycerin IV Soln 100 MCG/ML in D5W: INTRA_ARTERIAL | Qty: 10 | Status: AC

## 2020-08-04 MED FILL — Norepinephrine-NaCl IV Solution 4 MG/250ML-0.9%: INTRAVENOUS | Qty: 250 | Status: AC

## 2020-08-04 SURGICAL SUPPLY — 98 items
ADAPTER CARDIO PERF ANTE/RETRO (ADAPTER) ×5 IMPLANT
ADH SKN CLS APL DERMABOND .7 (GAUZE/BANDAGES/DRESSINGS) ×8
ADPR PRFSN 84XANTGRD RTRGD (ADAPTER) ×4
APL SRG 7X2 LUM MLBL SLNT (VASCULAR PRODUCTS) ×8
APPLICATOR TIP COSEAL (VASCULAR PRODUCTS) ×2 IMPLANT
APPLIER CLIP 9.375 SM OPEN (CLIP) ×5
APR CLP SM 9.3 20 MLT OPN (CLIP) ×4
BAG DECANTER FOR FLEXI CONT (MISCELLANEOUS) ×5 IMPLANT
BLADE CLIPPER SURG (BLADE) ×5 IMPLANT
BLADE STERNUM SYSTEM 6 (BLADE) ×5 IMPLANT
BLADE SURG 11 STRL SS (BLADE) ×1 IMPLANT
BLADE SURG 15 STRL LF DISP TIS (BLADE) ×4 IMPLANT
BLADE SURG 15 STRL SS (BLADE) ×5
BNDG ELASTIC 4X5.8 VLCR STR LF (GAUZE/BANDAGES/DRESSINGS) ×6 IMPLANT
BNDG ELASTIC 6X5.8 VLCR STR LF (GAUZE/BANDAGES/DRESSINGS) ×5 IMPLANT
BNDG GAUZE ELAST 4 BULKY (GAUZE/BANDAGES/DRESSINGS) ×6 IMPLANT
CANISTER SUCT 3000ML PPV (MISCELLANEOUS) ×5 IMPLANT
CANNULA NON VENT 22FR 12 (CANNULA) ×1 IMPLANT
CATH CPB KIT HENDRICKSON (MISCELLANEOUS) ×5 IMPLANT
CATH ROBINSON RED A/P 18FR (CATHETERS) ×10 IMPLANT
CLIP APPLIE 9.375 SM OPEN (CLIP) ×4 IMPLANT
CLIP VESOCCLUDE MED 24/CT (CLIP) ×1 IMPLANT
CONN ST 1/4X3/8  BEN (MISCELLANEOUS) ×10
CONN ST 1/4X3/8 BEN (MISCELLANEOUS) IMPLANT
COVER MAYO STAND STRL (DRAPES) ×5 IMPLANT
DEFOGGER ANTIFOG KIT (MISCELLANEOUS) ×1 IMPLANT
DERMABOND ADVANCED (GAUZE/BANDAGES/DRESSINGS) ×2
DERMABOND ADVANCED .7 DNX12 (GAUZE/BANDAGES/DRESSINGS) ×4 IMPLANT
DRAIN CHANNEL 28F RND 3/8 FF (WOUND CARE) ×15 IMPLANT
DRAPE CARDIOVASCULAR INCISE (DRAPES) ×5
DRAPE EXTREMITY T 121X128X90 (DISPOSABLE) ×5 IMPLANT
DRAPE HALF SHEET 40X57 (DRAPES) ×5 IMPLANT
DRAPE SLUSH/WARMER DISC (DRAPES) ×5 IMPLANT
DRAPE SRG 135X102X78XABS (DRAPES) ×4 IMPLANT
DRSG AQUACEL AG ADV 3.5X14 (GAUZE/BANDAGES/DRESSINGS) ×5 IMPLANT
ELECT CAUTERY BLADE 6.4 (BLADE) ×5 IMPLANT
ELECT REM PT RETURN 9FT ADLT (ELECTROSURGICAL) ×10
ELECTRODE REM PT RTRN 9FT ADLT (ELECTROSURGICAL) ×8 IMPLANT
FELT TEFLON 1X6 (MISCELLANEOUS) ×9 IMPLANT
GAUZE SPONGE 4X4 12PLY STRL (GAUZE/BANDAGES/DRESSINGS) ×10 IMPLANT
GAUZE SPONGE 4X4 12PLY STRL LF (GAUZE/BANDAGES/DRESSINGS) ×1 IMPLANT
GEL ULTRASOUND 20GR AQUASONIC (MISCELLANEOUS) ×5 IMPLANT
GLOVE BIO SURGEON STRL SZ 6.5 (GLOVE) ×8 IMPLANT
GLOVE BIO SURGEON STRL SZ8 (GLOVE) ×1 IMPLANT
GLOVE NEODERM STRL 7.5  LF PF (GLOVE) ×12
GLOVE NEODERM STRL 7.5 LF PF (GLOVE) ×12 IMPLANT
GLOVE SURG NEODERM 7.5  LF PF (GLOVE) ×3
GLOVE SURG SS PI 6.0 STRL IVOR (GLOVE) ×2 IMPLANT
GOWN STRL REUS W/ TWL LRG LVL3 (GOWN DISPOSABLE) ×16 IMPLANT
GOWN STRL REUS W/TWL LRG LVL3 (GOWN DISPOSABLE) ×40
HEMOSTAT POWDER SURGIFOAM 1G (HEMOSTASIS) ×10 IMPLANT
INSERT SUTURE HOLDER (MISCELLANEOUS) ×5 IMPLANT
KIT APPLICATOR RATIO 11:1 (KITS) ×1 IMPLANT
KIT BASIN OR (CUSTOM PROCEDURE TRAY) ×5 IMPLANT
KIT SUCTION CATH 14FR (SUCTIONS) ×5 IMPLANT
KIT TURNOVER KIT B (KITS) ×5 IMPLANT
KIT VASOVIEW HEMOPRO 2 VH 4000 (KITS) ×5 IMPLANT
NDL 18GX1X1/2 (RX/OR ONLY) (NEEDLE) ×4 IMPLANT
NEEDLE 18GX1X1/2 (RX/OR ONLY) (NEEDLE) ×5 IMPLANT
NS IRRIG 1000ML POUR BTL (IV SOLUTION) ×25 IMPLANT
PACK E OPEN HEART (SUTURE) ×5 IMPLANT
PACK OPEN HEART (CUSTOM PROCEDURE TRAY) ×5 IMPLANT
PACK PLATELET PROCEDURE 60 (MISCELLANEOUS) ×1 IMPLANT
PACK SPY-PHI (KITS) ×1 IMPLANT
PAD ARMBOARD 7.5X6 YLW CONV (MISCELLANEOUS) ×10 IMPLANT
PAD ELECT DEFIB RADIOL ZOLL (MISCELLANEOUS) ×5 IMPLANT
PENCIL BUTTON HOLSTER BLD 10FT (ELECTRODE) ×5 IMPLANT
POSITIONER HEAD DONUT 9IN (MISCELLANEOUS) ×5 IMPLANT
POWDER SURGICEL 3.0 GRAM (HEMOSTASIS) ×5 IMPLANT
SEALANT SURG COSEAL 8ML (VASCULAR PRODUCTS) ×1 IMPLANT
SET CARDIOPLEGIA MPS 5001102 (MISCELLANEOUS) ×1 IMPLANT
SHEARS HARMONIC 9CM CVD (BLADE) ×1 IMPLANT
STAPLER VISISTAT 35W (STAPLE) ×1 IMPLANT
SUPPORT HEART JANKE-BARRON (MISCELLANEOUS) ×5 IMPLANT
SUT BONE WAX W31G (SUTURE) ×5 IMPLANT
SUT MNCRL AB 3-0 PS2 18 (SUTURE) ×10 IMPLANT
SUT MNCRL AB 4-0 PS2 18 (SUTURE) ×1 IMPLANT
SUT PDS AB 1 CTX 36 (SUTURE) ×10 IMPLANT
SUT PROLENE 3 0 SH DA (SUTURE) ×5 IMPLANT
SUT PROLENE 6 0 C 1 30 (SUTURE) ×16 IMPLANT
SUT PROLENE 7 0 BV1 MDA (SUTURE) ×1 IMPLANT
SUT PROLENE 8 0 BV175 6 (SUTURE) ×1 IMPLANT
SUT PROLENE BLUE 7 0 (SUTURE) ×5 IMPLANT
SUT STEEL 6MS V (SUTURE) ×5 IMPLANT
SUT STEEL SZ 6 DBL 3X14 BALL (SUTURE) ×5 IMPLANT
SUT VIC AB 2-0 CT1 27 (SUTURE) ×10
SUT VIC AB 2-0 CT1 TAPERPNT 27 (SUTURE) IMPLANT
SUT VIC AB 2-0 CTX 36 (SUTURE) ×1 IMPLANT
SYSTEM SAHARA CHEST DRAIN ATS (WOUND CARE) ×5 IMPLANT
TAPE CLOTH SOFT 2X10 (GAUZE/BANDAGES/DRESSINGS) ×1 IMPLANT
TAPE CLOTH SURG 4X10 WHT LF (GAUZE/BANDAGES/DRESSINGS) ×1 IMPLANT
TIP DUAL SPRAY TOPICAL (TIP) ×2 IMPLANT
TOWEL GREEN STERILE (TOWEL DISPOSABLE) ×5 IMPLANT
TOWEL GREEN STERILE FF (TOWEL DISPOSABLE) ×5 IMPLANT
TRAY FOLEY SLVR 16FR TEMP STAT (SET/KITS/TRAYS/PACK) ×5 IMPLANT
TUBING LAP HI FLOW INSUFFLATIO (TUBING) ×5 IMPLANT
UNDERPAD 30X36 HEAVY ABSORB (UNDERPADS AND DIAPERS) ×5 IMPLANT
WATER STERILE IRR 1000ML POUR (IV SOLUTION) ×10 IMPLANT

## 2020-08-04 NOTE — Anesthesia Procedure Notes (Signed)
Procedure Name: Intubation Date/Time: 08/04/2020 9:26 AM Performed by: Colin Benton, CRNA Pre-anesthesia Checklist: Patient identified, Emergency Drugs available, Suction available and Patient being monitored Patient Re-evaluated:Patient Re-evaluated prior to induction Oxygen Delivery Method: Circle system utilized Preoxygenation: Pre-oxygenation with 100% oxygen Induction Type: IV induction Ventilation: Mask ventilation without difficulty Laryngoscope Size: Miller and 3 Grade View: Grade I Tube type: Oral Tube size: 7.5 mm Number of attempts: 1 Airway Equipment and Method: Stylet and Oral airway Placement Confirmation: ETT inserted through vocal cords under direct vision,  positive ETCO2 and breath sounds checked- equal and bilateral Secured at: 24 cm Tube secured with: Tape Dental Injury: Teeth and Oropharynx as per pre-operative assessment

## 2020-08-04 NOTE — Transfer of Care (Signed)
Immediate Anesthesia Transfer of Care Note  Patient: Brian Mccullough  Procedure(s) Performed: CORONARY ARTERY BYPASS GRAFTING (CABG), ON PUMP, TIMES SIX, USING BILATERAL INTERNAL MAMMARY ARTERIES, RIGHT ENDOSCOPICALLY HARVESTED GREATER SAPHENOUS VEIN, AND RIGHT RADIAL ARTERY OPEN HARVEST (N/A Chest) TRANSESOPHAGEAL ECHOCARDIOGRAM (TEE) (N/A ) RADIAL ARTERY HARVEST (Right Arm Lower) ENDOVEIN HARVEST OF GREATER SAPHENOUS VEIN (Right ) INDOCYANINE GREEN FLUORESCENCE IMAGING (ICG) (N/A )  Patient Location: SICU  Anesthesia Type:General  Level of Consciousness: sedated and Patient remains intubated per anesthesia plan  Airway & Oxygen Therapy: Patient remains intubated per anesthesia plan and Patient placed on Ventilator (see vital sign flow sheet for setting)  Post-op Assessment: Report given to RN and Post -op Vital signs reviewed and stable  Post vital signs: Reviewed and stable  Last Vitals:  Vitals Value Taken Time  BP 118/82 08/04/20 1600  Temp 35.7 C 08/04/20 1604  Pulse 80 08/04/20 1604  Resp 19 08/04/20 1604  SpO2 100 % 08/04/20 1604  Vitals shown include unvalidated device data.  Last Pain:  Vitals:   08/04/20 0800  TempSrc:   PainSc: 0-No pain      Patients Stated Pain Goal: 0 (53/64/68 0321)  Complications: No complications documented.

## 2020-08-04 NOTE — Anesthesia Procedure Notes (Signed)
Central Venous Catheter Insertion Performed by: Audry Pili, MD, anesthesiologist Start/End10/27/2021 9:32 AM, 08/04/2020 9:42 AM Preanesthetic checklist: patient identified, IV checked, risks and benefits discussed, surgical consent, monitors and equipment checked, pre-op evaluation, timeout performed and anesthesia consent Position: Trendelenburg Lidocaine 1% used for infiltration and patient sedated Hand hygiene performed , maximum sterile barriers used  and Seldinger technique used Catheter size: 8.5 Fr Central line was placed.Sheath introducer Procedure performed using ultrasound guided technique. Ultrasound Notes:anatomy identified, needle tip was noted to be adjacent to the nerve/plexus identified, no ultrasound evidence of intravascular and/or intraneural injection and image(s) printed for medical record Attempts: 1 Following insertion, line sutured, dressing applied and Biopatch. Post procedure assessment: blood return through all ports, free fluid flow and no air  Patient tolerated the procedure well with no immediate complications.

## 2020-08-04 NOTE — Anesthesia Preprocedure Evaluation (Addendum)
Anesthesia Evaluation  Patient identified by MRN, date of birth, ID band Patient awake    Reviewed: Allergy & Precautions, NPO status , Patient's Chart, lab work & pertinent test results, reviewed documented beta blocker date and time   History of Anesthesia Complications Negative for: history of anesthetic complications  Airway Mallampati: III  TM Distance: >3 FB Neck ROM: Full    Dental  (+) Edentulous Upper, Edentulous Lower   Pulmonary Current Smoker and Patient abstained from smoking.,    Pulmonary exam normal        Cardiovascular hypertension, Pt. on home beta blockers and Pt. on medications + CAD and + Past MI  Normal cardiovascular exam   '21 Cath - 1.  Acute inferior wall STEMI secondary to thrombotic occlusion of the RCA, treated successfully with balloon angioplasty alone 2.  Severe diffuse multivessel coronary artery disease with severe stenosis of the distal left main, ostial LAD, proximal LAD, ramus intermedius, proximal circumflex, and RCA as detailed above 3.  Mild segmental LV contraction abnormality with basal and mid inferior wall hypokinesis, LVEF estimated at 50 to 55%, mildly elevated LVEDP of 21 mmHg 4. Successful IABP placement under fluoroscopic guidance  '21 TTE - EF 55 to 60%. Trivial mitral valve regurgitation.     Neuro/Psych PSYCHIATRIC DISORDERS Anxiety negative neurological ROS     GI/Hepatic negative GI ROS, Neg liver ROS,   Endo/Other  diabetes, Type 2, Oral Hypoglycemic Agents  Renal/GU negative Renal ROS     Musculoskeletal  (+) Arthritis , Osteoarthritis,    Abdominal   Peds  Hematology negative hematology ROS (+)   Anesthesia Other Findings Covid test negative   Reproductive/Obstetrics                            Anesthesia Physical Anesthesia Plan  ASA: IV  Anesthesia Plan: General   Post-op Pain Management:    Induction: Intravenous  PONV  Risk Score and Plan: 2 and Treatment may vary due to age or medical condition  Airway Management Planned: Oral ETT  Additional Equipment: Arterial line, CVP, PA Cath and TEE  Intra-op Plan:   Post-operative Plan: Post-operative intubation/ventilation  Informed Consent: I have reviewed the patients History and Physical, chart, labs and discussed the procedure including the risks, benefits and alternatives for the proposed anesthesia with the patient or authorized representative who has indicated his/her understanding and acceptance.     Dental advisory given  Plan Discussed with: CRNA and Anesthesiologist  Anesthesia Plan Comments:        Anesthesia Quick Evaluation

## 2020-08-04 NOTE — Brief Op Note (Signed)
       [address]       [place] [address]             [phone]      08/03/2020 - 08/04/2020  2:15 PM  PATIENT:  Brian Mccullough  69 y.o. male  PRE-OPERATIVE DIAGNOSIS:  S/P STEMI  POST-OPERATIVE DIAGNOSIS:  S/P STEMI, CAD  PROCEDURE:  Procedure(s): CORONARY ARTERY BYPASS GRAFTING (CABG), ON PUMP, TIMES SIX, USING BILATERAL INTERNAL MAMMARY ARTERIES, RIGHT ENDOSCOPICALLY HARVESTED GREATER SAPHENOUS VEIN, AND RIGHT RADIAL ARTERY OPEN HARVEST (N/A) TRANSESOPHAGEAL ECHOCARDIOGRAM (TEE) (N/A) RADIAL ARTERY HARVEST (Right) ENDOVEIN HARVEST OF GREATER SAPHENOUS VEIN (Right) INDOCYANINE GREEN FLUORESCENCE IMAGING (ICG) (N/A)   LIMA to LAD RIMA to PL SVG to PDA SVG to Diag1 Radial to OM1 and Ramus  SURGEON:  Surgeon(s) and Role:    * Wonda Olds, MD - Primary  PHYSICIAN ASSISTANT:   Nicholes Rough, PA-C  ANESTHESIA:   general  EBL: per anes  BLOOD ADMINISTERED:none  DRAINS: ROUTINE   LOCAL MEDICATIONS USED:  NONE  SPECIMEN:  No Specimen  DISPOSITION OF SPECIMEN:  PATHOLOGY  COUNTS:  YES   DICTATION: .Dragon Dictation  PLAN OF CARE: Admit to inpatient   PATIENT DISPOSITION:  ICU - intubated and hemodynamically stable.   Delay start of Pharmacological VTE agent (>24hrs) due to surgical blood loss or risk of bleeding: yes

## 2020-08-04 NOTE — Anesthesia Procedure Notes (Signed)
Arterial Line Insertion Start/End10/27/2021 9:30 AM, 08/04/2020 9:35 AM Performed by: Verdie Drown, CRNA, CRNA  Preanesthetic checklist: patient identified, IV checked, site marked, risks and benefits discussed, surgical consent, monitors and equipment checked, pre-op evaluation, timeout performed and anesthesia consent Left, radial was placed Catheter size: 20 G Hand hygiene performed , maximum sterile barriers used  and Seldinger technique used Allen's test indicative of satisfactory collateral circulation Attempts: 1 Procedure performed without using ultrasound guided technique. Following insertion, dressing applied and Biopatch. Post procedure assessment: normal and unchanged  Patient tolerated the procedure well with no immediate complications.

## 2020-08-04 NOTE — Progress Notes (Signed)
  Echocardiogram Echocardiogram Transesophageal has been performed.  Fidel Levy 08/04/2020, 9:31 AM

## 2020-08-04 NOTE — Progress Notes (Signed)
Patient to OR at 60.

## 2020-08-04 NOTE — Progress Notes (Signed)
NIF: -35 VC: 1.6 L Pt performed both VC and NIF with good effort.

## 2020-08-04 NOTE — Anesthesia Postprocedure Evaluation (Signed)
Anesthesia Post Note  Patient: Brian Mccullough  Procedure(s) Performed: CORONARY ARTERY BYPASS GRAFTING (CABG), ON PUMP, TIMES SIX, USING BILATERAL INTERNAL MAMMARY ARTERIES, RIGHT ENDOSCOPICALLY HARVESTED GREATER SAPHENOUS VEIN, AND RIGHT RADIAL ARTERY OPEN HARVEST (N/A Chest) TRANSESOPHAGEAL ECHOCARDIOGRAM (TEE) (N/A ) RADIAL ARTERY HARVEST (Right Arm Lower) ENDOVEIN HARVEST OF GREATER SAPHENOUS VEIN (Right ) INDOCYANINE GREEN FLUORESCENCE IMAGING (ICG) (N/A )     Patient location during evaluation: SICU Anesthesia Type: General Level of consciousness: sedated and patient remains intubated per anesthesia plan Pain management: pain level controlled Vital Signs Assessment: post-procedure vital signs reviewed and stable Respiratory status: patient remains intubated per anesthesia plan and patient on ventilator - see flowsheet for VS Cardiovascular status: stable Anesthetic complications: no   No complications documented.  Last Vitals:  Vitals:   08/04/20 1645 08/04/20 1700  BP:  (!) 148/104  Pulse: 78 83  Resp: 16 (!) 21  Temp: (!) 36.1 C (!) 36.2 C  SpO2: 100% 100%    Last Pain:  Vitals:   08/04/20 1600  TempSrc: Core  PainSc:                  Delfino Friesen COKER

## 2020-08-04 NOTE — Plan of Care (Signed)

## 2020-08-04 NOTE — Progress Notes (Signed)
Brief cardiology imaging note:  Echo reporting system unable to save report, so brief summary included here  FINDINGS:  LEFT VENTRICLE: EF = 55-60%. No regional wall motion abnormalities.  RIGHT VENTRICLE: Normal size and function.   LEFT AND RIGHT ATRIA: normal size, no shunt seen on available images.  AORTIC VALVE:  Trileaflet. No regurgitation or stenosos  MITRAL VALVE:    Normal structure. Trivial regurgitation, no stenosis  TRICUSPID VALVE: Normal structure. Trivial regurgitation, no stenosis  PULMONIC VALVE: Not well visualized. Trivial regurgitation.  PERICARDIUM: No effusion noted.  Intra-aortic balloon pump not well evaluated on this study.  CONCLUSION: Normal LVEF without significant wall motion abnormalities, no significant valve disease.   Buford Dresser, MD, PhD [facility]  [address] [place], [place] [address] [phone]   8:37 AM

## 2020-08-04 NOTE — Progress Notes (Addendum)
Dr Orvan Seen at bedside, ordered banana bag infusion, increased pacer to AAI 80, and turn IABP to 1:2 by 0600 and be ready for removal first thing in the morning.

## 2020-08-04 NOTE — Consult Note (Signed)
[address]       [place] [address]             [phone]        Jaquise L Fabry [facility] Medical Record #[id number] Date of Birth: [birth year]  Referring: No ref. provider found Primary Care: [name], Evie Lacks, MD Primary Cardiologist:No primary care provider on file.  Chief Complaint:    Chief Complaint  Patient presents with  . Code STEMI    History of Present Illness:      69 yo man in [place] until evening of 08/03/20 when he experienced 1st episode of precordial CP. Called EMS after sx didn't abate. In field, EKG consistent with inferior MI. Pain persisted despite SL NTG x 3; brought to [facility] cath lab where severe multivessel CAD dx'd; culprit lesion was highly disease proximal RCA which was opened with POBA. IABP placed; consult to surgery. Stable overnight. No CP or dysrhythmias.   Current Activity/ Functional Status: Patient will be independent with mobility/ambulation, transfers, ADL's, IADL's.   Zubrod Score: At the time of surgery this patient's most appropriate activity status/level should be described as: []     0    Normal activity, no symptoms []     1    Restricted in physical strenuous activity but ambulatory, able to do out light work []     2    Ambulatory and capable of self care, unable to do work activities, up and about                 more than 50%  Of the time                            []     3    Only limited self care, in bed greater than 50% of waking hours []     4    Completely disabled, no self care, confined to bed or chair []     5    Moribund  Past Medical History:  Diagnosis Date  . Allergy   . Hyperactive gag reflex   . Hypertension    under control with meds., has been on med. x 30 yr.  . Inguinal hernia 01/2014   bilateral  . LBP (low back pain)   . Non-insulin dependent type 2 diabetes mellitus ([place])   . OA (osteoarthritis) of knee 01/2004   left  . Palpitations   . Vitamin B 12 deficiency     Past  Surgical History:  Procedure Laterality Date  . CORONARY/GRAFT ACUTE MI REVASCULARIZATION N/A 08/03/2020   Procedure: Coronary/Graft Acute MI Revascularization;  Surgeon: Sherren Mocha, MD;  Location: [place] [facility];  Service: Cardiovascular;  Laterality: N/A;  . INGUINAL HERNIA REPAIR  age 14-5  . INGUINAL HERNIA REPAIR Bilateral 02/09/2014   Procedure: OPEN BILATERAL INGUINAL HERNIA REPAIR WITH MESH;  Surgeon: [name], MD;  Location: [facility];  Service: General;  Laterality: Bilateral;  . INSERTION OF MESH Bilateral 02/09/2014   Procedure: INSERTION OF MESH;  Surgeon: [name], MD;  Location: [place];  Service: General;  Laterality: Bilateral;  . KNEE ARTHROSCOPY Right   . LEFT HEART CATH AND CORONARY ANGIOGRAPHY N/A 08/03/2020   Procedure: LEFT HEART CATH AND CORONARY ANGIOGRAPHY;  Surgeon: Sherren Mocha, MD;  Location: [place] [facility];  Service: Cardiovascular;  Laterality: N/A;  . ORCHIECTOMY Left 05/31/2017   Procedure: LEFT  RADICAL ORCHIECTOMY;  Surgeon: Irine Seal, MD;  Location: [facility];  Service: Urology;  Laterality: Left;  . TONSILLECTOMY AND ADENOIDECTOMY  1960  . TOTAL KNEE ARTHROPLASTY Left 01/14/2004    Social History   Tobacco Use  Smoking Status Current Every Day Smoker  . Years: 22.00  . Types: Cigars  Smokeless Tobacco Never Used  Tobacco Comment   2-3 small cigars/day    Social History   Substance and Sexual Activity  Alcohol Use No     Allergies  Allergen Reactions  . Codeine Nausea Only    Current Facility-Administered Medications  Medication Dose Route Frequency Provider Last Rate Last Admin  . 0.9 %  sodium chloride infusion   Intravenous Continuous Nipp, Carriel T, MD 20 mL/hr at 08/03/20 2218 20 mL/hr at 08/03/20 2218  . 0.9 %  sodium chloride infusion  250 mL Intravenous PRN Sherren Mocha, MD      . acetaminophen (TYLENOL) tablet 650 mg  650 mg Oral Q4H PRN Sherren Mocha, MD      . amiodarone (NEXTERONE PREMIX) 360-4.14 MG/200ML-% (1.8 mg/mL) IV infusion  15 mg/hr Intravenous Continuous Nipp, Carriel T, MD 8.33 mL/hr at 08/04/20 0600 15 mg/hr at 08/04/20 0600  . aspirin chewable tablet 81 mg  81 mg Oral Daily Sherren Mocha, MD      . atorvastatin (LIPITOR) tablet 80 mg  80 mg Oral Daily Nipp, Carriel T, MD      . bisacodyl (DULCOLAX) EC tablet 5 mg  5 mg Oral Once Sherren Mocha, MD      . cefUROXime (ZINACEF) 1.5 g in sodium chloride 0.9 % 100 mL IVPB  1.5 g Intravenous To OR Aris Even, Glenice Bow, MD      . cefUROXime (ZINACEF) 750 mg in sodium chloride 0.9 % 100 mL IVPB  750 mg Intravenous To OR Rowland Ericsson Z, MD      . chlorhexidine (PERIDEX) 0.12 % solution 15 mL  15 mL Mouth/Throat Once Sherren Mocha, MD      . Chlorhexidine Gluconate Cloth 2 % PADS 6 each  6 each Topical Once Sherren Mocha, MD      . dexmedetomidine (PRECEDEX) 400 MCG/100ML (4 mcg/mL) infusion  0.1-0.7 mcg/kg/hr Intravenous To OR Thea Gist, RPH      . EPINEPHrine (ADRENALIN) 4 mg in NS 250 mL (0.016 mg/mL) premix infusion  0-10 mcg/min Intravenous To OR Thea Gist, RPH      . heparin 30,000 units/NS 1000 mL solution for CELLSAVER   Other To OR Thea Gist, RPH      . heparin sodium (porcine) 2,500 Units, papaverine 30 mg in electrolyte-148 (PLASMALYTE-148) 500 mL irrigation   Irrigation To OR Thea Gist, RPH      . insulin aspart (novoLOG) injection 0-15 Units  0-15 Units Subcutaneous TID WC Nipp, Carriel T, MD      . insulin aspart (novoLOG) injection 0-5 Units  0-5 Units Subcutaneous QHS Nipp, Carriel T, MD      . insulin aspart (novoLOG) injection 4 Units  4 Units Subcutaneous TID WC Nipp, Carriel T, MD      . insulin regular, human (MYXREDLIN) 100 units/ 100 mL infusion   Intravenous To OR Jacky, Thomas A, RPH      . magnesium sulfate (IV Push/IM) injection 40 mEq  40 mEq Other To OR Thea Gist, RPH      . metoprolol tartrate (LOPRESSOR) tablet  12.5 mg  12.5 mg Oral Once Sherren Mocha,  MD      . milrinone (PRIMACOR) 20 MG/100 ML (0.2 mg/mL) infusion  0.3 mcg/kg/min Intravenous To OR Thea Gist, RPH      . morphine 2 MG/ML injection 2-4 mg  2-4 mg Intravenous Q1H PRN Sherren Mocha, MD      . nitroGLYCERIN (NITROSTAT) SL tablet 0.4 mg  0.4 mg Sublingual Q5 Min x 3 PRN Nipp, Carriel T, MD      . nitroGLYCERIN 50 mg in dextrose 5 % 250 mL (0.2 mg/mL) infusion  2-200 mcg/min Intravenous To OR Thea Gist, RPH      . norepinephrine (LEVOPHED) 4mg  in 244mL premix infusion  0-40 mcg/min Intravenous To OR Thea Gist, RPH      . ondansetron (ZOFRAN) injection 4 mg  4 mg Intravenous Q6H PRN Sherren Mocha, MD      . phenylephrine (NEOSYNEPHRINE) 20-0.9 MG/250ML-% infusion  30-200 mcg/min Intravenous To OR Thea Gist, RPH      . potassium chloride injection 80 mEq  80 mEq Other To OR Thea Gist, RPH      . sodium chloride flush (NS) 0.9 % injection 3 mL  3 mL Intravenous Q12H Sherren Mocha, MD      . sodium chloride flush (NS) 0.9 % injection 3 mL  3 mL Intravenous PRN Sherren Mocha, MD      . temazepam (RESTORIL) capsule 15 mg  15 mg Oral Once PRN Camari Quintanilla, Glenice Bow, MD      . tirofiban (AGGRASTAT) infusion 50 mcg/mL 100 mL  0.15 mcg/kg/min Intravenous Continuous Sherren Mocha, MD 18.36 mL/hr at 08/04/20 0600 0.15 mcg/kg/min at 08/04/20 0600  . tranexamic acid (CYKLOKAPRON) 2,500 mg in sodium chloride 0.9 % 250 mL (10 mg/mL) infusion  1.5 mg/kg/hr Intravenous To OR Thea Gist, RPH      . tranexamic acid (CYKLOKAPRON) bolus via infusion - over 30 minutes 1,567.5 mg  15 mg/kg Intravenous To OR Thea Gist, RPH      . tranexamic acid (CYKLOKAPRON) pump prime solution 209 mg  2 mg/kg Intracatheter To OR Thea Gist, RPH      . vancomycin (VANCOREADY) IVPB 1500 mg/300 mL  1,500 mg Intravenous To OR Lametria Klunk, Glenice Bow, MD        Medications Prior to Admission  Medication Sig Dispense Refill Last Dose  .  aspirin 81 MG EC tablet Take 81 mg by mouth daily.     08/03/2020 at Unknown time  . benazepril (LOTENSIN) 20 MG tablet Take 1 tablet (20 mg total) by mouth daily. 90 tablet 3 08/03/2020 at Unknown time  . metoprolol tartrate (LOPRESSOR) 25 MG tablet TAKE 1 TO 2 TABLETS BY  MOUTH TWICE DAILY AS NEEDED FOR PALPITATIONS 360 tablet 3 08/03/2020 at Unknown time  . Cholecalciferol (VITAMIN D3) 1000 UNITS tablet Take 1,000 Units by mouth daily.    Unknown at Unknown time  . cyanocobalamin 100 MCG tablet Take 1,000 mcg by mouth daily.    Unknown at Unknown time  . meloxicam (MOBIC) 15 MG tablet Take 1 tablet (15 mg total) by mouth daily as needed. for pain 90 tablet 3 Unknown at Unknown time  . metFORMIN (GLUCOPHAGE) 500 MG tablet Take 1 tablet (500 mg total) by mouth daily with breakfast. 90 tablet 3 Unknown at Unknown time  . potassium chloride SA (KLOR-CON) 20 MEQ tablet Take 1 tablet (20 mEq total) by mouth daily. 30 tablet 1 Unknown at Unknown time  . tiZANidine (ZANAFLEX) 4 MG tablet TAKE  1 TABLET BY MOUTH  EVERY 8 HOURS AS NEEDED FOR MUSCLE SPASM(S) 270 tablet 0 Unknown at Unknown time  . traMADol (ULTRAM) 50 MG tablet TAKE 1 TO 2 TABLETS BY MOUTH TWICE DAILY AS NEEDED 100 tablet 3 Unknown at Unknown time  . traZODone (DESYREL) 50 MG tablet TAKE 1/2 TO 1 TABLETS BY  MOUTH AT BEDTIME AS NEEDED  FOR SLEEP. 90 tablet 3 Unknown at Unknown time  . triamcinolone ointment (KENALOG) 0.1 % Apply 1 application topically 2 (two) times daily as needed (rash). 80 g 0 Unknown at Unknown time    Family History  Problem Relation Age of Onset  . Hypertension Mother   . Diabetes Mother   . Diabetes Father   . Colon cancer Neg Hx   . Esophageal cancer Neg Hx   . Rectal cancer Neg Hx   . Stomach cancer Neg Hx      Review of Systems:   ROS Pertinent items noted in HPI and remainder of comprehensive ROS otherwise negative.     Cardiac Review of Systems: Y or  [    ]= no  Chest Pain [    ]  Resting SOB [    ] Exertional SOB  [  ]  Orthopnea [  ]   Pedal Edema [   ]    Palpitations [  ] Syncope  [  ]   Presyncope [   ]  General Review of Systems: [Y] = yes [  ]=no Constitional: recent weight change [  ]; anorexia [  ]; fatigue [  ]; nausea [  ]; night sweats [  ]; fever [  ]; or chills [  ]                                                               Dental: Last Dentist visit:   Eye : blurred vision [  ]; diplopia [   ]; vision changes [  ];  Amaurosis fugax[  ]; Resp: cough [  ];  wheezing[  ];  hemoptysis[  ]; shortness of breath[  ]; paroxysmal nocturnal dyspnea[  ]; dyspnea on exertion[  ]; or orthopnea[  ];  GI:  gallstones[  ], vomiting[  ];  dysphagia[  ]; melena[  ];  hematochezia [  ]; heartburn[  ];   Hx of  Colonoscopy[  ]; GU: kidney stones [  ]; hematuria[  ];   dysuria [  ];  nocturia[  ];  history of     obstruction [  ]; urinary frequency [  ]             Skin: rash, swelling[  ];, hair loss[  ];  peripheral edema[  ];  or itching[  ]; Musculosketetal: myalgias[  ];  joint swelling[  ];  joint erythema[  ];  joint pain[  ];  back pain[  ];  Heme/Lymph: bruising[  ];  bleeding[  ];  anemia[  ];  Neuro: TIA[  ];  headaches[  ];  stroke[  ];  vertigo[  ];  seizures[  ];   paresthesias[  ];  difficulty walking[  ];  Psych:depression[  ]; anxiety[  ];  Endocrine: diabetes[  ];  thyroid dysfunction[  ];  Physical Exam: BP 119/81   Pulse (!) 179   Temp 98.2 F (36.8 C) (Oral)   Resp 14   Ht 6\' 4"  (1.93 m)   Wt 104.5 kg   SpO2 100%   BMI 28.04 kg/m    General appearance: alert and cooperative Head: Normocephalic, without obvious abnormality, atraumatic Neck: no adenopathy, no carotid bruit, no JVD, supple, symmetrical, trachea midline and thyroid not enlarged, symmetric, no tenderness/mass/nodules Resp: clear to auscultation bilaterally Cardio: regular rate and rhythm, S1, S2 normal, no murmur, click, rub or gallop GI: soft, non-tender; bowel sounds normal; no  masses,  no organomegaly Extremities: old left knee incision; modified Allens' testing on right with plethysmography suggests intact palmar arch Neurologic: Alert and oriented X 3, normal strength and tone. Normal symmetric reflexes. Normal coordination and gait  Diagnostic Studies & Laboratory data:     Recent Radiology Findings:   CARDIAC CATHETERIZATION  Result Date: 08/04/2020 1.  Acute inferior wall STEMI secondary to thrombotic occlusion of the RCA, treated successfully with balloon angioplasty alone 2.  Severe diffuse multivessel coronary artery disease with severe stenosis of the distal left main, ostial LAD, proximal LAD, ramus intermedius, proximal circumflex, and RCA as detailed above 3.  Mild segmental LV contraction abnormality with basal and mid inferior wall hypokinesis, LVEF estimated at 50 to 55%, mildly elevated LVEDP of 21 mmHg 4. Successful IABP placement under fluoroscopic guidance Recommendations: Continue intra-aortic balloon pump at one-to-one augmentation, continue tirofiban infusion, cardiac surgical evaluation tomorrow morning  DG Chest Port 1 View  Result Date: 08/03/2020 CLINICAL DATA:  Chest pain EXAM: PORTABLE CHEST 1 VIEW COMPARISON:  05/31/2007 FINDINGS: The heart size and mediastinal contours are within normal limits. Both lungs are clear. The visualized skeletal structures are unremarkable. IMPRESSION: No active disease. Electronically Signed   By: Donavan Foil M.D.   On: 08/03/2020 22:31     I have independently reviewed the above radiologic studies and discussed with the patient   Recent Lab Findings: Lab Results  Component Value Date   WBC 8.0 08/04/2020   HGB 14.4 08/04/2020   HCT 42.9 08/04/2020   PLT 164 08/04/2020   GLUCOSE 109 (H) 08/04/2020   CHOL 171 08/04/2020   TRIG 68 08/04/2020   HDL 37 (L) 08/04/2020   LDLDIRECT 88.1 01/23/2011   LDLCALC 120 (H) 08/04/2020   ALT 13 08/04/2020   AST 47 (H) 08/04/2020   NA 140 08/04/2020   K 3.8  08/04/2020   CL 108 08/04/2020   CREATININE 1.02 08/04/2020   BUN 10 08/04/2020   CO2 20 (L) 08/04/2020   TSH 0.673 08/04/2020   INR 1.1 08/04/2020   HGBA1C 5.7 07/30/2020      Assessment / Plan:      69 yo man with HTN and DM presented with STEMI. Agree with suggestion for urgent CABG. Will complete routine work-up this am including TTE and vascular studies; plan surgery later this morning.    I  spent 40 minutes counseling the patient face to face.   Shaunn Tackitt Z. Orvan Seen, MD [phone] 08/04/2020 7:29 AM

## 2020-08-04 NOTE — Anesthesia Procedure Notes (Signed)
Central Venous Catheter Insertion Performed by: Audry Pili, MD, anesthesiologist Start/End10/27/2021 9:40 AM, 08/04/2020 9:42 AM Patient location: Pre-op. Preanesthetic checklist: patient identified, IV checked, risks and benefits discussed, surgical consent, monitors and equipment checked, pre-op evaluation, timeout performed and anesthesia consent Position: Trendelenburg Hand hygiene performed  and maximum sterile barriers used  Total catheter length 10. PA cath was placed.Swan type:thermodilution PA Cath depth:50 Procedure performed without using ultrasound guided technique. Attempts: 1 Patient tolerated the procedure well with no immediate complications.

## 2020-08-04 NOTE — Progress Notes (Signed)
Patient ID: Brian Mccullough, male   DOB: Feb 14, 1951, 69 y.o.   MRN: [id number]  TCTS Evening Rounds:   Hemodynamically stable in atrial paced rhythm.  IABP 1:2 CI = 1.6  Has started to wake up on vent.   Urine output good  CT output low  CBC    Component Value Date/Time   WBC 11.4 (H) 08/04/2020 1601   RBC 3.63 (L) 08/04/2020 1601   HGB 10.5 (L) 08/04/2020 1605   HCT 31.0 (L) 08/04/2020 1605   PLT 105 (L) 08/04/2020 1601   MCV 91.2 08/04/2020 1601   MCH 30.3 08/04/2020 1601   MCHC 33.2 08/04/2020 1601   RDW 12.6 08/04/2020 1601   LYMPHSABS 1.2 08/04/2020 0419   MONOABS 0.6 08/04/2020 0419   EOSABS 0.1 08/04/2020 0419   BASOSABS 0.0 08/04/2020 0419     BMET    Component Value Date/Time   NA 143 08/04/2020 1605   K 4.1 08/04/2020 1605   CL 106 08/04/2020 1441   CO2 20 (L) 08/04/2020 0419   GLUCOSE 100 (H) 08/04/2020 1441   GLUCOSE 110 (H) 08/09/2006 1151   BUN 11 08/04/2020 1441   CREATININE 0.80 08/04/2020 1441   CALCIUM 9.0 08/04/2020 0419   GFRNONAA >60 08/04/2020 0419   GFRAA 81 10/29/2008 1632     A/P:  Stable postop course. Continue current plans. Extubate when ready.

## 2020-08-04 NOTE — Progress Notes (Signed)
Progress Note  Patient Name: Brian Mccullough Date of Encounter: 08/04/2020  [facility] Cardiologist: No primary care provider on file. New- Dr Burt Knack  Subjective   Feels well this am. Denies any chest pain or SOB. Comfortable.   Inpatient Medications    Scheduled Meds: . aspirin  81 mg Oral Daily  . atorvastatin  80 mg Oral Daily  . bisacodyl  5 mg Oral Once  . chlorhexidine  15 mL Mouth/Throat Once  . Chlorhexidine Gluconate Cloth  6 each Topical Once  . epinephrine  0-10 mcg/min Intravenous To OR  . heparin-papaverine-plasmalyte irrigation   Irrigation To OR  . insulin aspart  0-15 Units Subcutaneous TID WC  . insulin aspart  0-5 Units Subcutaneous QHS  . insulin aspart  4 Units Subcutaneous TID WC  . insulin   Intravenous To OR  . magnesium sulfate  40 mEq Other To OR  . metoprolol tartrate  12.5 mg Oral Once  . phenylephrine  30-200 mcg/min Intravenous To OR  . potassium chloride  80 mEq Other To OR  . sodium chloride flush  3 mL Intravenous Q12H  . tranexamic acid  15 mg/kg Intravenous To OR  . tranexamic acid  2 mg/kg Intracatheter To OR   Continuous Infusions: . sodium chloride 20 mL/hr (08/03/20 2218)  . sodium chloride    . amiodarone 15 mg/hr (08/04/20 0600)  . cefUROXime (ZINACEF)  IV    . cefUROXime (ZINACEF)  IV    . dexmedetomidine    . heparin 30,000 units/NS 1000 mL solution for CELLSAVER    . milrinone    . nitroGLYCERIN    . norepinephrine    . tirofiban 0.15 mcg/kg/min (08/04/20 0600)  . tranexamic acid (CYKLOKAPRON) infusion ([facility])    . vancomycin     PRN Meds: sodium chloride, acetaminophen, morphine injection, nitroGLYCERIN, ondansetron (ZOFRAN) IV, sodium chloride flush, temazepam   Vital Signs    Vitals:   08/04/20 0430 08/04/20 0500 08/04/20 0530 08/04/20 0600  BP: (!) 147/102 (!) 135/108 (!) 125/95 119/81  Pulse: (!) 120 81 (!) 176 (!) 179  Resp: 12 20 13 14   Temp:      TempSrc:      SpO2: 100% 100% 100% 100%  Weight:   104.5 kg    Height:        Intake/Output Summary (Last 24 hours) at 08/04/2020 0753 Last data filed at 08/04/2020 0600 Gross per 24 hour  Intake 596.53 ml  Output 1150 ml  Net -553.47 ml   Last 3 Weights 08/04/2020 08/03/2020 07/30/2020  Weight (lbs) 230 lb 6.1 oz 224 lb 13.9 oz 225 lb 3.2 oz  Weight (kg) 104.5 kg 102 kg 102.15 kg      Telemetry    NSR 24 beat run NSVT - Personally Reviewed  ECG    NSR with PVC couplet. ST elevation resolved inferiorly - Personally Reviewed  Physical Exam    GEN: No acute distress.   Neck: No JVD Cardiac: RRR, no murmurs, rubs, or gallops.  Respiratory: Clear to auscultation bilaterally. GI: Soft, nontender, non-distended  MS: No edema; No deformity. IABP in right groin without hematoma.  Neuro:  Nonfocal  Psych: Normal affect   Labs    High Sensitivity Troponin:   Recent Labs  Lab 08/03/20 2221 08/04/20 0128 08/04/20 0419  TROPONINIHS 25* 8,846* 20,723*      Chemistry Recent Labs  Lab 07/30/20 0830 07/30/20 0830 08/03/20 2221 08/04/20 0128 08/04/20 0419  NA 141   < >  140 140 140  K 4.5   < > 3.4* 4.0 3.8  CL 106   < > 108 107 108  CO2 30   < > 23 25 20*  GLUCOSE 78   < > 123* 108* 109*  BUN 11   < > 10 11 10   CREATININE 0.93   < > 1.28* 1.13 1.02  CALCIUM 9.4   < > 9.1 9.2 9.0  PROT 6.6  --  6.4* 6.2*  --   ALBUMIN 4.2  --  3.6 3.6  --   AST 11  --  20 47*  --   ALT 6  --  10 13  --   ALKPHOS 62  --  58 50  --   BILITOT 0.5  --  0.8 0.8  --   GFRNONAA  --   --  >60 >60 >60  ANIONGAP  --   --  9 8 12    < > = values in this interval not displayed.     Hematology Recent Labs  Lab 08/03/20 2221 08/04/20 0128 08/04/20 0419  WBC 7.8 7.9 8.0  RBC 4.90 5.02 4.77  HGB 14.9 15.0 14.4  HCT 44.5 46.0 42.9  MCV 90.8 91.6 89.9  MCH 30.4 29.9 30.2  MCHC 33.5 32.6 33.6  RDW 12.5 12.7 12.4  PLT 194 171 164    BNP Recent Labs  Lab 08/04/20 0128  BNP 529.0*     DDimer No results for input(s): DDIMER in  the last 168 hours.   Radiology    CARDIAC CATHETERIZATION  Result Date: 08/04/2020 1.  Acute inferior wall STEMI secondary to thrombotic occlusion of the RCA, treated successfully with balloon angioplasty alone 2.  Severe diffuse multivessel coronary artery disease with severe stenosis of the distal left main, ostial LAD, proximal LAD, ramus intermedius, proximal circumflex, and RCA as detailed above 3.  Mild segmental LV contraction abnormality with basal and mid inferior wall hypokinesis, LVEF estimated at 50 to 55%, mildly elevated LVEDP of 21 mmHg 4. Successful IABP placement under fluoroscopic guidance Recommendations: Continue intra-aortic balloon pump at one-to-one augmentation, continue tirofiban infusion, cardiac surgical evaluation tomorrow morning  DG Chest Port 1 View  Result Date: 08/03/2020 CLINICAL DATA:  Chest pain EXAM: PORTABLE CHEST 1 VIEW COMPARISON:  05/31/2007 FINDINGS: The heart size and mediastinal contours are within normal limits. Both lungs are clear. The visualized skeletal structures are unremarkable. IMPRESSION: No active disease. Electronically Signed   By: Donavan Foil M.D.   On: 08/03/2020 22:31    Cardiac Studies   Coronary/Graft Acute MI Revascularization  LEFT HEART CATH AND CORONARY ANGIOGRAPHY  Conclusion  1.  Acute inferior wall STEMI secondary to thrombotic occlusion of the RCA, treated successfully with balloon angioplasty alone 2.  Severe diffuse multivessel coronary artery disease with severe stenosis of the distal left main, ostial LAD, proximal LAD, ramus intermedius, proximal circumflex, and RCA as detailed above 3.  Mild segmental LV contraction abnormality with basal and mid inferior wall hypokinesis, LVEF estimated at 50 to 55%, mildly elevated LVEDP of 21 mmHg 4. Successful IABP placement under fluoroscopic guidance  Recommendations: Continue intra-aortic balloon pump at one-to-one augmentation, continue tirofiban infusion, cardiac  surgical evaluation tomorrow morning   Patient Profile     69 y.o. male with history of DM type 2, HTN, HLD presents with acute inferior STEMI  Assessment & Plan    1. Acute Inferior STEMI. Troponin up to 20,700. S/p emergent PCI of the RCA with POBA. Found  to have severe multivessel and left main disease. IABP placed and plan for urgent revascularization with CABG today. He is currently pain free and hemodynamically stable. Ecg shows resolution of ST elevation. EF fairly good on cath. Echo reviewed personally and EF is normal. No significant valvular disease. No effusion. On high dose statin and ASA. Post op will consider resuming ACEi and beta blocker as BP and renal function allow. Consider adding Plavix prior to DC 2. DM on SSI now 3. HLD. LDL 120. On high dose statin 4. HTN currently controlled.    For questions or updates, please contact [place] Please consult [email or web address] for contact info under        Signed, Jackee Glasner [place], MD  08/04/2020, 7:53 AM

## 2020-08-04 NOTE — H&P (Signed)
History and Physical Interval Note:  08/04/2020 7:58 AM  Brian Mccullough  has presented today for surgery, with the diagnosis of S/P STEMI.  The various methods of treatment have been discussed with the patient and family. After consideration of risks, benefits and other options for treatment, the patient has consented to  Procedure(s): CORONARY ARTERY BYPASS GRAFTING (CABG) (N/A) TRANSESOPHAGEAL ECHOCARDIOGRAM (TEE) (N/A) POSSIBLE RADIAL ARTERY HARVEST (Left) as a surgical intervention.  The patient's history has been reviewed, patient examined, no change in status, stable for surgery.  I have reviewed the patient's chart and labs.  Questions were answered to the patient's satisfaction.     [name]  Correction: the procedure should read "right radial artery harvesting." This clarification is made on the operative consent form as well. [name], [place]

## 2020-08-04 NOTE — Progress Notes (Signed)
ANTICOAGULATION CONSULT NOTE - Initial Consult  Pharmacy Consult for Heparin Indication: IABP  Allergies  Allergen Reactions  . Codeine Nausea Only    Patient Measurements: Height: 6\' 4"  (193 cm) Weight: 102 kg (224 lb 13.9 oz) IBW/kg (Calculated) : 86.8  Vital Signs: Temp: 98 F (36.7 C) (10/27 0019) Temp Source: Oral (10/27 0019) BP: 109/78 (10/26 2348) Pulse Rate: 0 (10/26 2353)  Labs: Recent Labs    08/03/20 2221  HGB 14.9  HCT 44.5  PLT 194  APTT 26  LABPROT 13.3  INR 1.1  CREATININE 1.28*  TROPONINIHS 25*    Estimated Creatinine Clearance: 67.8 mL/min (A) (by C-G formula based on SCr of 1.28 mg/dL (H)).   Medical History: Past Medical History:  Diagnosis Date  . Allergy   . Hyperactive gag reflex   . Hypertension    under control with meds., has been on med. x 30 yr.  . Inguinal hernia 01/2014   bilateral  . LBP (low back pain)   . Non-insulin dependent type 2 diabetes mellitus ([place])   . OA (osteoarthritis) of knee 01/2004   left  . Palpitations   . Vitamin B 12 deficiency     Medications:  Medications Prior to Admission  Medication Sig Dispense Refill Last Dose  . aspirin 81 MG EC tablet Take 81 mg by mouth daily.     08/03/2020 at Unknown time  . benazepril (LOTENSIN) 20 MG tablet Take 1 tablet (20 mg total) by mouth daily. 90 tablet 3 08/03/2020 at Unknown time  . metoprolol tartrate (LOPRESSOR) 25 MG tablet TAKE 1 TO 2 TABLETS BY  MOUTH TWICE DAILY AS NEEDED FOR PALPITATIONS 360 tablet 3 08/03/2020 at Unknown time  . Cholecalciferol (VITAMIN D3) 1000 UNITS tablet Take 1,000 Units by mouth daily.    Unknown at Unknown time  . cyanocobalamin 100 MCG tablet Take 1,000 mcg by mouth daily.    Unknown at Unknown time  . meloxicam (MOBIC) 15 MG tablet Take 1 tablet (15 mg total) by mouth daily as needed. for pain 90 tablet 3 Unknown at Unknown time  . metFORMIN (GLUCOPHAGE) 500 MG tablet Take 1 tablet (500 mg total) by mouth daily with breakfast. 90  tablet 3 Unknown at Unknown time  . potassium chloride SA (KLOR-CON) 20 MEQ tablet Take 1 tablet (20 mEq total) by mouth daily. 30 tablet 1 Unknown at Unknown time  . tiZANidine (ZANAFLEX) 4 MG tablet TAKE 1 TABLET BY MOUTH  EVERY 8 HOURS AS NEEDED FOR MUSCLE SPASM(S) 270 tablet 0 Unknown at Unknown time  . traMADol (ULTRAM) 50 MG tablet TAKE 1 TO 2 TABLETS BY MOUTH TWICE DAILY AS NEEDED 100 tablet 3 Unknown at Unknown time  . traZODone (DESYREL) 50 MG tablet TAKE 1/2 TO 1 TABLETS BY  MOUTH AT BEDTIME AS NEEDED  FOR SLEEP. 90 tablet 3 Unknown at Unknown time  . triamcinolone ointment (KENALOG) 0.1 % Apply 1 application topically 2 (two) times daily as needed (rash). 80 g 0 Unknown at Unknown time    Assessment: 69 y.o. male with STEMI s/p cath/angioplasty, on IABP and awaiting CABG, for heparin  Goal of Therapy:  Heparin level 0.2-0.5 units/mL Monitor platelets by anticoagulation protocol: Yes   Plan:  Start heparin 1500 units/hr Check heparin level in 8 hours.   Siboney Requejo, Bronson Curb 08/04/2020,12:58 AM

## 2020-08-04 NOTE — Procedures (Signed)
Extubation Procedure Note  Patient Details:   Name: Brian Mccullough DOB: Nov 24, 1950 MRN: [id number]   Airway Documentation:    Vent end date: 08/04/20 Vent end time: 2046   Evaluation  O2 sats: stable throughout Complications: No apparent complications Patient did tolerate procedure well. Bilateral Breath Sounds: Clear, Diminished   Yes   Pt extubated to 4L [place] without any complications. Pt had positive cuff leak noted. He performed his IS with good effort. Pts sats are current;ly 100%. RT will continue to monitor.   Tobi Bastos 08/04/2020, 8:51 PM

## 2020-08-05 ENCOUNTER — Inpatient Hospital Stay (HOSPITAL_COMMUNITY): Payer: Medicare Other

## 2020-08-05 ENCOUNTER — Encounter (HOSPITAL_COMMUNITY): Payer: Self-pay | Admitting: Cardiothoracic Surgery

## 2020-08-05 LAB — CBC
HCT: 33.6 % — ABNORMAL LOW (ref 39.0–52.0)
HCT: 32.7 % — ABNORMAL LOW (ref 39.0–52.0)
HCT: 35.8 % — ABNORMAL LOW (ref 39.0–52.0)
Hemoglobin: 11.2 g/dL — ABNORMAL LOW (ref 13.0–17.0)
Hemoglobin: 10.8 g/dL — ABNORMAL LOW (ref 13.0–17.0)
Hemoglobin: 11.9 g/dL — ABNORMAL LOW (ref 13.0–17.0)
MCH: 30.5 pg (ref 26.0–34.0)
MCH: 30.3 pg (ref 26.0–34.0)
MCH: 30.6 pg (ref 26.0–34.0)
MCHC: 33.3 g/dL (ref 30.0–36.0)
MCHC: 33 g/dL (ref 30.0–36.0)
MCHC: 33.2 g/dL (ref 30.0–36.0)
MCV: 91.6 fL (ref 80.0–100.0)
MCV: 91.9 fL (ref 80.0–100.0)
MCV: 92 fL (ref 80.0–100.0)
Platelets: UNDETERMINED 10*3/uL (ref 150–400)
Platelets: 104 10*3/uL — ABNORMAL LOW (ref 150–400)
Platelets: 117 10*3/uL — ABNORMAL LOW (ref 150–400)
RBC: 3.67 MIL/uL — ABNORMAL LOW (ref 4.22–5.81)
RBC: 3.56 MIL/uL — ABNORMAL LOW (ref 4.22–5.81)
RBC: 3.89 MIL/uL — ABNORMAL LOW (ref 4.22–5.81)
RDW: 13.1 % (ref 11.5–15.5)
RDW: 13 % (ref 11.5–15.5)
RDW: 13.1 % (ref 11.5–15.5)
WBC: 9.5 10*3/uL (ref 4.0–10.5)
WBC: 9.5 10*3/uL (ref 4.0–10.5)
WBC: 11.7 10*3/uL — ABNORMAL HIGH (ref 4.0–10.5)
nRBC: 0 % (ref 0.0–0.2)
nRBC: 0 % (ref 0.0–0.2)
nRBC: 0 % (ref 0.0–0.2)

## 2020-08-05 LAB — POCT ACTIVATED CLOTTING TIME: Activated Clotting Time: 92 seconds

## 2020-08-05 LAB — GLUCOSE, CAPILLARY
Glucose-Capillary: 100 mg/dL — ABNORMAL HIGH (ref 70–99)
Glucose-Capillary: 102 mg/dL — ABNORMAL HIGH (ref 70–99)
Glucose-Capillary: 103 mg/dL — ABNORMAL HIGH (ref 70–99)
Glucose-Capillary: 105 mg/dL — ABNORMAL HIGH (ref 70–99)
Glucose-Capillary: 105 mg/dL — ABNORMAL HIGH (ref 70–99)
Glucose-Capillary: 105 mg/dL — ABNORMAL HIGH (ref 70–99)
Glucose-Capillary: 108 mg/dL — ABNORMAL HIGH (ref 70–99)
Glucose-Capillary: 108 mg/dL — ABNORMAL HIGH (ref 70–99)
Glucose-Capillary: 110 mg/dL — ABNORMAL HIGH (ref 70–99)
Glucose-Capillary: 137 mg/dL — ABNORMAL HIGH (ref 70–99)
Glucose-Capillary: 68 mg/dL — ABNORMAL LOW (ref 70–99)
Glucose-Capillary: 94 mg/dL (ref 70–99)
Glucose-Capillary: 96 mg/dL (ref 70–99)
Glucose-Capillary: 97 mg/dL (ref 70–99)

## 2020-08-05 LAB — BASIC METABOLIC PANEL
Anion gap: 7 (ref 5–15)
Anion gap: 9 (ref 5–15)
BUN: 14 mg/dL (ref 8–23)
BUN: 14 mg/dL (ref 8–23)
CO2: 20 mmol/L — ABNORMAL LOW (ref 22–32)
CO2: 21 mmol/L — ABNORMAL LOW (ref 22–32)
Calcium: 8.1 mg/dL — ABNORMAL LOW (ref 8.9–10.3)
Calcium: 8.6 mg/dL — ABNORMAL LOW (ref 8.9–10.3)
Chloride: 114 mmol/L — ABNORMAL HIGH (ref 98–111)
Chloride: 109 mmol/L (ref 98–111)
Creatinine, Ser: 0.89 mg/dL (ref 0.61–1.24)
Creatinine, Ser: 0.92 mg/dL (ref 0.61–1.24)
GFR, Estimated: >60 mL/min (ref >60)
GFR, Estimated: >60 mL/min (ref >60)
Glucose, Bld: 110 mg/dL — ABNORMAL HIGH (ref 70–99)
Glucose, Bld: 117 mg/dL — ABNORMAL HIGH (ref 70–99)
Potassium: 4 mmol/L (ref 3.5–5.1)
Potassium: 4.1 mmol/L (ref 3.5–5.1)
Sodium: 141 mmol/L (ref 135–145)
Sodium: 139 mmol/L (ref 135–145)

## 2020-08-05 LAB — HEMOGLOBIN A1C
Hgb A1c MFr Bld: 5.5 % (ref 4.8–5.6)
Mean Plasma Glucose: 111 mg/dL

## 2020-08-05 LAB — MAGNESIUM
Magnesium: 2.2 mg/dL (ref 1.7–2.4)
Magnesium: 2.1 mg/dL (ref 1.7–2.4)

## 2020-08-05 MED ORDER — ATORVASTATIN CALCIUM 80 MG PO TABS: 80.0000 mg | ORAL_TABLET | Freq: Every day | ORAL | Status: DC

## 2020-08-05 MED ORDER — ISOSORBIDE DINITRATE 10 MG PO TABS
10.0000 mg | ORAL_TABLET | Freq: Three times a day (TID) | ORAL | Status: DC
  Administered 2020-08-05 – 2020-08-09 (×13): 10 mg via ORAL
  Filled 2020-08-05 (×13): qty 1

## 2020-08-05 MED ORDER — INSULIN ASPART 100 UNIT/ML ~~LOC~~ SOLN: 0.0000 [IU] | SUBCUTANEOUS | Status: DC

## 2020-08-05 MED ORDER — ATORVASTATIN CALCIUM 80 MG PO TABS
80.0000 mg | ORAL_TABLET | Freq: Every day | ORAL | Status: DC
  Administered 2020-08-06 – 2020-08-09 (×4): 80 mg via ORAL
  Filled 2020-08-05 (×4): qty 1

## 2020-08-05 MED ORDER — INSULIN ASPART 100 UNIT/ML ~~LOC~~ SOLN
0.0000 [IU] | Freq: Three times a day (TID) | SUBCUTANEOUS | Status: DC
  Administered 2020-08-08: 4 [IU] via SUBCUTANEOUS

## 2020-08-05 MED ORDER — KETOROLAC TROMETHAMINE 15 MG/ML IJ SOLN
7.5000 mg | Freq: Four times a day (QID) | INTRAMUSCULAR | Status: AC
  Administered 2020-08-05 – 2020-08-06 (×5): 7.5 mg via INTRAVENOUS
  Filled 2020-08-05 (×5): qty 1

## 2020-08-05 MED FILL — Potassium Chloride Inj 2 mEq/ML: INTRAVENOUS | Qty: 40 | Status: AC

## 2020-08-05 MED FILL — Magnesium Sulfate Inj 50%: INTRAMUSCULAR | Qty: 10 | Status: AC

## 2020-08-05 MED FILL — Heparin Sodium (Porcine) Inj 1000 Unit/ML: INTRAMUSCULAR | Qty: 30 | Status: AC

## 2020-08-05 NOTE — Hospital Course (Addendum)
HPI:   69 yo man in [place] until evening of 08/03/20 when he experienced 1st episode of precordial CP. Called EMS after sx didn't abate. In field, EKG consistent with inferior MI. Pain persisted despite SL NTG x 3; brought to [facility] cath lab where severe multivessel CAD dx'd; culprit lesion was highly disease proximal RCA which was opened with POBA. IABP placed; consult to surgery. Stable overnight. No CP or dysrhythmias.   Hospital Course:   On 08/04/2020,  Brian Mccullough underwent a CABG x 6 with Dr. Orvan Seen. He tolerated the procedure well and was transferred to the surgical ICU in stable condition. Patient was extubated in a timely manner. His balloon pump was removed on 10/28. POD 2 he continued to progress. He was off all drips and his chest tubes were removed. He was walking around the unit. His foley catheter was removed.

## 2020-08-05 NOTE — Progress Notes (Signed)
IABP removed from RFA, manual pressure applied for 30 minutes. Site level 0 no S+S of hematoma. Tegaderm dressing applied, bedrest instructions given.   Bilateral DP and PT pulses all present with doppler. Slightly weaker on the left side.   Bedrest begins at 08:38:00.

## 2020-08-05 NOTE — Addendum Note (Signed)
Addendum  created 08/05/20 1859 by Josephine Igo, CRNA   Order list changed

## 2020-08-05 NOTE — Progress Notes (Signed)
EVENING ROUNDS NOTE :     [address]       [place] [address]             [phone]                 1 Day Post-Op Procedure(s) (LRB): CORONARY ARTERY BYPASS GRAFTING (CABG), ON PUMP, TIMES SIX, USING BILATERAL INTERNAL MAMMARY ARTERIES, RIGHT ENDOSCOPICALLY HARVESTED GREATER SAPHENOUS VEIN, AND RIGHT RADIAL ARTERY OPEN HARVEST (N/A) TRANSESOPHAGEAL ECHOCARDIOGRAM (TEE) (N/A) RADIAL ARTERY HARVEST (Right) ENDOVEIN HARVEST OF GREATER SAPHENOUS VEIN (Right) INDOCYANINE GREEN FLUORESCENCE IMAGING (ICG) (N/A)   Total Length of Stay:  LOS: 2 days  Events:   No events IABP out Resting comfortably    BP 111/67    Pulse 72    Temp 99.3 F (37.4 C) (Oral)    Resp (!) 29    Ht 6\' 4"  (1.93 m)    Wt 111 kg    SpO2 97%    BMI 29.79 kg/m   PAP: (14-37)/(4-25) 26/14 CO:  [3.7 L/min-6.3 L/min] 6.2 L/min CI:  [1.6 L/min/m2-2.7 L/min/m2] 2.6 L/min/m2  Vent Mode: PSV;CPAP FiO2 (%):  [40 %] 40 % PEEP:  [5 cmH20] 5 cmH20 Pressure Support:  [5 cmH20-10 cmH20] 5 cmH20 Plateau Pressure:  [15 cmH20] 15 cmH20   sodium chloride Stopped (08/05/20 1432)   sodium chloride     sodium chloride Stopped (08/05/20 0800)   cefUROXime (ZINACEF)  IV 1.5 g (08/05/20 1742)   lactated ringers     lactated ringers     lactated ringers Stopped (08/05/20 1429)   nitroGLYCERIN Stopped (08/05/20 1414)    I/O last 3 completed shifts: In: 84 [I.V.:3892.7; Blood:530; IV Piggyback:1980.3] Out: 6010 [Urine:2705; Chest Tube:480]   CBC Latest Ref Rng & Units 08/05/2020 08/05/2020 08/05/2020  WBC 4.0 - 10.5 K/uL 11.7(H) 9.5 9.5  Hemoglobin 13.0 - 17.0 g/dL 11.9(L) 10.8(L) 11.2(L)  Hematocrit 39 - 52 % 35.8(L) 32.7(L) 33.6(L)  Platelets 150 - 400 K/uL 117(L) 104(L) PLATELET CLUMPS NOTED ON SMEAR, UNABLE TO ESTIMATE    BMP Latest Ref Rng & Units 08/05/2020 08/05/2020 08/04/2020  Glucose 70 - 99 mg/dL 117(H) 110(H) -  BUN 8 - 23 mg/dL 14 14 -  Creatinine 0.61 - 1.24 mg/dL 0.92  0.89 -  Sodium 135 - 145 mmol/L 139 141 143  Potassium 3.5 - 5.1 mmol/L 4.1 4.0 4.3  Chloride 98 - 111 mmol/L 109 114(H) -  CO2 22 - 32 mmol/L 21(L) 20(L) -  Calcium 8.9 - 10.3 mg/dL 8.6(L) 8.1(L) -    ABG    Component Value Date/Time   PHART 7.323 (L) 08/04/2020 2149   PCO2ART 41.1 08/04/2020 2149   PO2ART 132 (H) 08/04/2020 2149   HCO3 21.3 08/04/2020 2149   TCO2 22 08/04/2020 2149   ACIDBASEDEF 4.0 (H) 08/04/2020 2149   O2SAT 99.0 08/04/2020 2149       Melodie Bouillon, MD 08/05/2020 6:28 PM

## 2020-08-05 NOTE — Op Note (Signed)
CARDIOTHORACIC SURGERY OPERATIVE NOTE  Date of Procedure: 08/04/20 Preoperative Diagnosis: Severe 3-vessel Coronary Artery Disease, status post STEMI  Postoperative Diagnosis: Same  Procedure:    Coronary Artery Bypass Grafting x 6  Left Internal Mammary Artery to Distal Left Anterior Descending Coronary Artery, Saphenous Vein Graft to  Posterior Descending Coronary Artery; left radial artery graft to first obtuse Marginal Branch of Left Circumflex Coronary Artery and ramus intermedius vessel as a sequenced graft; Sapheonous Vein Graft to first diagonal Branch Coronary Artery; pedicled right internal mammary artery to right posterior lateral artery  endoscopic Vein Harvest from right thigh  Open right radial artery harvesting Bilateral internal mammary artery harvesting Completion graft surveillance with indocyanine green fluorescence imaging (spy)  Surgeon: B.  Murvin Natal, MD  Assistant: T.  Harriet Pho, PA-C  Anesthesia: General  Operative Findings:  Preserved left ventricular systolic function  Good quality internal mammary artery conduits  Good quality saphenous vein conduit and right radial artery conduit  Good quality target vessels for grafting    BRIEF CLINICAL NOTE AND INDICATIONS FOR SURGERY  69 year old man with a history of well-controlled diabetes and hypertension presented with sudden onset chest pain the previous evening.  This did not abate by routine self-care measures and he called 911 where an EKG in the field suggested STEMI.  He continued to have pain despite administration of sublingual nitroglycerin.  He was transported to the [facility] where he underwent urgent left heart catheterization demonstrating subtotally occluded right coronary artery and multivessel coronary artery disease.  A balloon pump was placed and the patient was taken to the operating room the following morning on 08/04/2020 for urgent revascularization   DETAILS OF THE OPERATIVE  PROCEDURE  Preparation:  The patient is brought to the operating room on the above mentioned date and central monitoring was established by the anesthesia team including placement of Swan-Ganz catheter and radial arterial line. The patient is placed in the supine position on the operating table.  Intravenous antibiotics are administered. General endotracheal anesthesia is induced uneventfully. A Foley catheter is placed.  Baseline transesophageal echocardiogram was performed.  Findings were notable for preserved left ventricular function, left ventricular hypertrophy and intact valvular function  The patient's chest, abdomen, both groins, the right upper extremity, and both lower extremities are prepared and draped in a sterile manner. A time out procedure is performed.   Surgical Approach and Conduit Harvest:  Attention was first turned to the right upper extremity where an incision was made overlying the palpable right radial pulse.  Modified Allen testing with plethysmography had been performed to demonstrate safety of harvesting the right radial artery as a bypass conduit.  Open right radial artery harvesting was performed.  Once completed, the forearm incision was closed in layers and staples were used to reapproximate the skin.  The arm was then retucked at the right side.  A median sternotomy incision was performed and the left internal mammary artery is dissected from the chest wall and prepared for bypass grafting. The left internal mammary artery is notably good quality conduit. Simultaneously, the greater saphenous vein is obtained from the patient's right thigh using endoscopic vein harvest technique. The saphenous vein is notably good quality conduit. After removal of the saphenous vein, the small surgical incisions in the lower extremity are closed with absorbable suture.  Next, attention was turned to the right hemithorax of the right internal mammary artery and its pedicle mobilized in  standard fashion.  Prior to dividing the pedicle distally  full dose heparin is given intravenously.  Following systemic heparinization, the  internal mammary artery conduits were transected distally noted to have excellent flow.  Both were treated with solution of papaverine.  Multilevel rib block was undertaken by injecting Exparel solution into the visible intercostal spaces seen to the sternotomy on each side   Extracorporeal Cardiopulmonary Bypass and Myocardial Protection:  The pericardium is opened. The ascending aorta is nondiseased in appearance. The ascending aorta and the right atrium are cannulated for cardiopulmonary bypass.  Adequate heparinization is verified.     Cardiopulmonary bypass was begun and the surface of the heart is inspected. Distal target vessels are selected for coronary artery bypass grafting. A cardioplegia cannula is placed in the ascending aorta.    The patient is allowed to cool passively to 34C systemic temperature.  The aortic cross clamp is applied and cold blood cardioplegia is delivered initially in an antegrade fashion through the aortic root.   Iced saline slush is applied for topical hypothermia.  The initial cardioplegic arrest is rapid with early diastolic arrest.  Repeat doses of cardioplegia are administered intermittently throughout the entire cross clamp portion of the operation through the aortic root  and through subsequently placed vein grafts in order to maintain completely flat electrocardiogram .  Coronary Artery Bypass Grafting:   The  posterior descending branch of the right coronary artery was grafted using a reversed saphenous vein graft in an end-to-side fashion.  At the site of distal anastomosis the target vessel was good quality and measured approximately 1.5 mm in diameter.  The first obtuse marginal branch of the left circumflex coronary artery and the ramus intermedius branch were  grafted using the right radial artery graft  in a  sequenced fashion.  At the site of distal anastomoses the target vessel were good quality and measured approximately 1.5 mm in diameter.  The first diagonal branch of the left anterior descending coronary artery was grafted using a reversed saphenous vein graft in an end-to-side fashion.  At the site of distal anastomosis the target vessel was good quality and measured approximately 1.5 mm in diameter.  The posterior lateral branch of the right coronary artery was grafted in and end-to-side fashion using the pedicled right internal mammary artery graft at the site of distal anastomosis the target vessel was good quality and measured approximately 1.5 mm in diameter.  Anastomotic patency and runoff was confirmed with indocyanine green fluorescence imaging (SPY).  The distal left anterior coronary artery was grafted with the left internal mammary artery in an end-to-side fashion.  At the site of distal anastomosis the target vessel was good quality and measured approximately 1.5 mm in diameter.  Anastomotic patency and runoff was confirmed with indocyanine green fluorescence imaging (SPY).  All proximal coronary graft anastomoses were placed directly to the ascending aorta prior to removal of the aortic cross clamp.  De-airing procedures were performed and the aortic cross-clamp was removed  Procedure Completion:  All proximal and distal coronary anastomoses were inspected for hemostasis and appropriate graft orientation. Epicardial pacing wires are fixed to the right ventricular outflow tract and to the right atrial appendage. The patient is rewarmed to 37C temperature. The patient is weaned and disconnected from cardiopulmonary bypass.  The patient's rhythm at separation from bypass was sinus bradycardia.  The patient was weaned from cardiopulmonary bypass without any inotropic support.   Followup transesophageal echocardiogram performed after separation from bypass revealed no changes from the  preoperative exam.  The aortic and  venous cannula were removed uneventfully. Protamine was administered to reverse the anticoagulation. The mediastinum and pleural space were inspected for hemostasis and irrigated with saline solution. The mediastinum and both pleural space were drained using fluted chest tubes placed through separate stab incisions inferiorly.  The soft tissues anterior to the aorta were reapproximated loosely. The sternum is closed with double strength sternal wire. The soft tissues anterior to the sternum were closed in multiple layers and the skin is closed with a running subcuticular skin closure.  The post-bypass portion of the operation was notable for stable rhythm and hemodynamics.  No blood products were administered during the operation.   Disposition:  The patient tolerated the procedure well and is transported to the surgical intensive care in stable condition. There are no intraoperative complications. All sponge instrument and needle counts are verified correct at completion of the operation.    Jayme Cloud, MD 08/05/2020 8:28 AM

## 2020-08-05 NOTE — Progress Notes (Signed)
1 Day Post-Op Procedure(s) (LRB): CORONARY ARTERY BYPASS GRAFTING (CABG), ON PUMP, TIMES SIX, USING BILATERAL INTERNAL MAMMARY ARTERIES, RIGHT ENDOSCOPICALLY HARVESTED GREATER SAPHENOUS VEIN, AND RIGHT RADIAL ARTERY OPEN HARVEST (N/A) TRANSESOPHAGEAL ECHOCARDIOGRAM (TEE) (N/A) RADIAL ARTERY HARVEST (Right) ENDOVEIN HARVEST OF GREATER SAPHENOUS VEIN (Right) INDOCYANINE GREEN FLUORESCENCE IMAGING (ICG) (N/A) Subjective: No complaints  Objective: Vital signs in last 24 hours: Temp:  [96.1 F (35.6 C)-99.9 F (37.7 C)] 99.7 F (37.6 C) (10/28 0700) Pulse Rate:  [33-130] 35 (10/28 0700) Cardiac Rhythm: Normal sinus rhythm (10/28 0400) Resp:  [9-32] 26 (10/28 0700) BP: (74-148)/(44-104) 96/59 (10/28 0700) SpO2:  [85 %-100 %] 99 % (10/28 0700) Arterial Line BP: (89-163)/(38-73) 130/54 (10/28 0700) FiO2 (%):  [40 %-50 %] 40 % (10/27 2000) Weight:  [176 kg] 111 kg (10/28 0500)  Hemodynamic parameters for last 24 hours: PAP: (15-37)/(6-25) 18/8 CO:  [2.9 L/min-6.3 L/min] 6.3 L/min CI:  [1.2 L/min/m2-2.7 L/min/m2] 2.7 L/min/m2  Intake/Output from previous day: 10/27 0701 - 10/28 0700 In: 5806.5 [I.V.:3296.2; Blood:530; IV Piggyback:1980.3] Out: 2035 [Urine:1555; Chest Tube:480] Intake/Output this shift: No intake/output data recorded.  General appearance: alert and appears stated age Neurologic: intact Heart: regular rate and rhythm, S1, S2 normal, no murmur, click, rub or gallop Lungs: clear to auscultation bilaterally Abdomen: soft, non-tender; bowel sounds normal; no masses,  no organomegaly Extremities: extremities normal, atraumatic, no cyanosis or edema Wound: dressed, dry  Lab Results: Recent Labs    08/05/20 0417 08/05/20 0640  WBC 9.5 9.5  HGB 11.2* 10.8*  HCT 33.6* 32.7*  PLT PLATELET CLUMPS NOTED ON SMEAR, UNABLE TO ESTIMATE 104*   BMET:  Recent Labs    08/04/20 2148 08/04/20 2148 08/04/20 2149 08/05/20 0417  NA 140   < > 143 141  K 4.3   < > 4.3 4.0  CL  113*  --   --  114*  CO2 21*  --   --  20*  GLUCOSE 110*  --   --  110*  BUN 12  --   --  14  CREATININE 0.85  --   --  0.89  CALCIUM 8.2*  --   --  8.1*   < > = values in this interval not displayed.    PT/INR:  Recent Labs    08/04/20 1601  LABPROT 17.5*  INR 1.5*   ABG    Component Value Date/Time   PHART 7.323 (L) 08/04/2020 2149   HCO3 21.3 08/04/2020 2149   TCO2 22 08/04/2020 2149   ACIDBASEDEF 4.0 (H) 08/04/2020 2149   O2SAT 99.0 08/04/2020 2149   CBG (last 3)  Recent Labs    08/05/20 0417 08/05/20 0513 08/05/20 0640  GLUCAP 105* 108* 110*    Assessment/Plan: S/P Procedure(s) (LRB): CORONARY ARTERY BYPASS GRAFTING (CABG), ON PUMP, TIMES SIX, USING BILATERAL INTERNAL MAMMARY ARTERIES, RIGHT ENDOSCOPICALLY HARVESTED GREATER SAPHENOUS VEIN, AND RIGHT RADIAL ARTERY OPEN HARVEST (N/A) TRANSESOPHAGEAL ECHOCARDIOGRAM (TEE) (N/A) RADIAL ARTERY HARVEST (Right) ENDOVEIN HARVEST OF GREATER SAPHENOUS VEIN (Right) INDOCYANINE GREEN FLUORESCENCE IMAGING (ICG) (N/A) Mobilize remove IABP this am; possibly out of bed to chair this pm   LOS: 2 days    Wonda Olds 08/05/2020

## 2020-08-05 NOTE — Progress Notes (Signed)
Doing well post CABG. Hemodynamics look good. Plan to remove IABP today. Renal function is good.  Ecg with Inferior T wave inversion- evolutionary.  Will continue ASA, high dose statin. Beta blocker  Plan to resume ACEi as BP and renal function allow.  Kendrea Cerritos Martinique MD, [facility]

## 2020-08-06 ENCOUNTER — Inpatient Hospital Stay (HOSPITAL_COMMUNITY): Payer: Medicare Other

## 2020-08-06 LAB — BASIC METABOLIC PANEL
Anion gap: 6 (ref 5–15)
BUN: 13 mg/dL (ref 8–23)
CO2: 22 mmol/L (ref 22–32)
Calcium: 8 mg/dL — ABNORMAL LOW (ref 8.9–10.3)
Chloride: 111 mmol/L (ref 98–111)
Creatinine, Ser: 0.78 mg/dL (ref 0.61–1.24)
GFR, Estimated: >60 mL/min (ref >60)
Glucose, Bld: 97 mg/dL (ref 70–99)
Potassium: 3.7 mmol/L (ref 3.5–5.1)
Sodium: 139 mmol/L (ref 135–145)

## 2020-08-06 LAB — CBC
HCT: 32.5 % — ABNORMAL LOW (ref 39.0–52.0)
Hemoglobin: 10.8 g/dL — ABNORMAL LOW (ref 13.0–17.0)
MCH: 30.5 pg (ref 26.0–34.0)
MCHC: 33.2 g/dL (ref 30.0–36.0)
MCV: 91.8 fL (ref 80.0–100.0)
Platelets: 100 10*3/uL — ABNORMAL LOW (ref 150–400)
RBC: 3.54 MIL/uL — ABNORMAL LOW (ref 4.22–5.81)
RDW: 13.1 % (ref 11.5–15.5)
WBC: 10.3 10*3/uL (ref 4.0–10.5)
nRBC: 0 % (ref 0.0–0.2)

## 2020-08-06 LAB — ECHO INTRAOPERATIVE TEE
Height: 76 in
Weight: 3686.09 oz

## 2020-08-06 LAB — GLUCOSE, CAPILLARY
Glucose-Capillary: 81 mg/dL (ref 70–99)
Glucose-Capillary: 103 mg/dL — ABNORMAL HIGH (ref 70–99)
Glucose-Capillary: 99 mg/dL (ref 70–99)
Glucose-Capillary: 88 mg/dL (ref 70–99)

## 2020-08-06 MED ORDER — METOPROLOL TARTRATE 25 MG/10 ML ORAL SUSPENSION: 25.0000 mg | Freq: Two times a day (BID) | ORAL | Status: DC

## 2020-08-06 MED ORDER — POTASSIUM CHLORIDE CRYS ER 20 MEQ PO TBCR: 40.0000 meq | EXTENDED_RELEASE_TABLET | Freq: Once | ORAL | Status: DC

## 2020-08-06 MED ORDER — ASPIRIN EC 81 MG PO TBEC
81.0000 mg | DELAYED_RELEASE_TABLET | Freq: Every day | ORAL | Status: DC
  Administered 2020-08-06 – 2020-08-09 (×4): 81 mg via ORAL
  Filled 2020-08-06 (×4): qty 1

## 2020-08-06 MED ORDER — METOPROLOL TARTRATE 25 MG PO TABS
25.0000 mg | ORAL_TABLET | Freq: Two times a day (BID) | ORAL | Status: DC
  Administered 2020-08-06 – 2020-08-07 (×3): 25 mg via ORAL
  Filled 2020-08-06 (×3): qty 1

## 2020-08-06 MED ORDER — POTASSIUM CHLORIDE CRYS ER 20 MEQ PO TBCR
20.0000 meq | EXTENDED_RELEASE_TABLET | ORAL | Status: AC
  Administered 2020-08-06 (×3): 20 meq via ORAL
  Filled 2020-08-06 (×3): qty 1

## 2020-08-06 MED ORDER — FUROSEMIDE 10 MG/ML IJ SOLN
40.0000 mg | Freq: Two times a day (BID) | INTRAMUSCULAR | Status: DC
  Administered 2020-08-06 – 2020-08-08 (×5): 40 mg via INTRAVENOUS
  Filled 2020-08-06 (×5): qty 4

## 2020-08-06 MED ORDER — COLCHICINE 0.3 MG HALF TABLET
0.3000 mg | ORAL_TABLET | Freq: Two times a day (BID) | ORAL | Status: DC
  Administered 2020-08-06 – 2020-08-09 (×7): 0.3 mg via ORAL
  Filled 2020-08-06 (×7): qty 1

## 2020-08-06 MED ORDER — BENAZEPRIL HCL 5 MG PO TABS
10.0000 mg | ORAL_TABLET | Freq: Every day | ORAL | Status: DC
  Administered 2020-08-06 – 2020-08-09 (×4): 10 mg via ORAL
  Filled 2020-08-06 (×4): qty 2

## 2020-08-06 MED ORDER — AMLODIPINE BESYLATE 5 MG PO TABS: 5.0000 mg | ORAL_TABLET | Freq: Every day | ORAL | Status: DC

## 2020-08-06 MED ORDER — CLOPIDOGREL BISULFATE 75 MG PO TABS
75.0000 mg | ORAL_TABLET | Freq: Every day | ORAL | Status: DC
  Administered 2020-08-06 – 2020-08-09 (×4): 75 mg via ORAL
  Filled 2020-08-06 (×4): qty 1

## 2020-08-06 NOTE — Progress Notes (Signed)
Wife - Ivin Booty, notified of patient transfer to [facility].  Report called to [facility] and patient transferred via wheelchair to [facility]. No patient belongings noted or reported at bedside.  VSS during transfer, no adverse events.

## 2020-08-06 NOTE — Progress Notes (Signed)
TCTS DAILY ICU PROGRESS NOTE                   [address]            [place] [address]          [phone]   2 Days Post-Op Procedure(s) (LRB): CORONARY ARTERY BYPASS GRAFTING (CABG), ON PUMP, TIMES SIX, USING BILATERAL INTERNAL MAMMARY ARTERIES, RIGHT ENDOSCOPICALLY HARVESTED GREATER SAPHENOUS VEIN, AND RIGHT RADIAL ARTERY OPEN HARVEST (N/A) TRANSESOPHAGEAL ECHOCARDIOGRAM (TEE) (N/A) RADIAL ARTERY HARVEST (Right) ENDOVEIN HARVEST OF GREATER SAPHENOUS VEIN (Right) INDOCYANINE GREEN FLUORESCENCE IMAGING (ICG) (N/A)  Total Length of Stay:  LOS: 3 days   Subjective: Feels okay this morning. Pain is well controlled. Already walked in the halls.   Objective: Vital signs in last 24 hours: Temp:  [98.6 F (37 C)-99.7 F (37.6 C)] 98.7 F (37.1 C) (10/29 0700) Pulse Rate:  [69-86] 75 (10/29 0800) Cardiac Rhythm: Normal sinus rhythm (10/29 0800) Resp:  [20-45] 26 (10/29 0800) BP: (97-159)/(62-84) 125/72 (10/29 0800) SpO2:  [95 %-99 %] 97 % (10/29 0800) Arterial Line BP: (128-159)/(53-75) 142/63 (10/28 1430) Weight:  [106.3 kg] 106.3 kg (10/29 0700)  Filed Weights   08/04/20 0500 08/05/20 0500 08/06/20 0700  Weight: 104.5 kg 111 kg 106.3 kg    Weight change: -4.7 kg   Hemodynamic parameters for last 24 hours: PAP: (14-29)/(4-19) 26/14 CO:  [6.2 L/min] 6.2 L/min CI:  [2.6 L/min/m2] 2.6 L/min/m2  Intake/Output from previous day: 10/28 0701 - 10/29 0700 In: 1373.2 [P.O.:540; I.V.:672.1; IV Piggyback:161.1] Out: 1815 [Urine:1125; Chest Tube:690]  Intake/Output this shift: Total I/O In: 47 [I.V.:8.1; IV Piggyback:38.9] Out: 95 [Urine:55; Chest Tube:40]  Current Meds: Scheduled Meds: . acetaminophen  1,000 mg Oral Q6H   Or  . acetaminophen (TYLENOL) oral liquid 160 mg/5 mL  1,000 mg Per Tube Q6H  . aspirin EC  325 mg Oral Daily   Or  . aspirin  324 mg Per Tube Daily  . atorvastatin  80 mg Oral Daily  . benazepril  10 mg Oral Daily  . bisacodyl   10 mg Oral Daily   Or  . bisacodyl  10 mg Rectal Daily  . Chlorhexidine Gluconate Cloth  6 each Topical Daily  . docusate sodium  200 mg Oral Daily  . insulin aspart  0-24 Units Subcutaneous TID AC & HS  . isosorbide dinitrate  10 mg Oral TID  . ketorolac  7.5 mg Intravenous Q6H  . metoprolol tartrate  25 mg Oral BID   Or  . metoprolol tartrate  25 mg Per Tube BID  . pantoprazole  40 mg Oral Daily  . potassium chloride  20 mEq Oral Q4H  . sodium chloride flush  10-40 mL Intracatheter Q12H  . sodium chloride flush  3 mL Intravenous Q12H   Continuous Infusions: . sodium chloride Stopped (08/05/20 1432)  . sodium chloride    . sodium chloride 10 mL/hr at 08/06/20 0800  . lactated ringers    . lactated ringers    . lactated ringers Stopped (08/05/20 1429)  . nitroGLYCERIN Stopped (08/05/20 1414)   PRN Meds:.sodium chloride, dextrose, fentaNYL (SUBLIMAZE) injection, lactated ringers, metoprolol tartrate, ondansetron (ZOFRAN) IV, sodium chloride flush, sodium chloride flush, traMADol  General appearance: alert, cooperative and no distress Heart: regular rate and rhythm, S1, S2 normal, no murmur, click, rub or gallop Lungs: clear to auscultation bilaterally Abdomen: soft, non-tender; bowel sounds normal; no masses,  no organomegaly Extremities: extremities normal, atraumatic, no  cyanosis or edema Wound: clean and dry dressed with sterile dressing  Lab Results: CBC: Recent Labs    08/05/20 1626 08/06/20 0418  WBC 11.7* 10.3  HGB 11.9* 10.8*  HCT 35.8* 32.5*  PLT 117* 100*   BMET:  Recent Labs    08/05/20 1626 08/06/20 0418  NA 139 139  K 4.1 3.7  CL 109 111  CO2 21* 22  GLUCOSE 117* 97  BUN 14 13  CREATININE 0.92 0.78  CALCIUM 8.6* 8.0*    CMET: Lab Results  Component Value Date   WBC 10.3 08/06/2020   HGB 10.8 (L) 08/06/2020   HCT 32.5 (L) 08/06/2020   PLT 100 (L) 08/06/2020   GLUCOSE 97 08/06/2020   CHOL 171 08/04/2020   TRIG 68 08/04/2020   HDL 37 (L)  08/04/2020   LDLDIRECT 88.1 01/23/2011   LDLCALC 120 (H) 08/04/2020   ALT 13 08/04/2020   AST 47 (H) 08/04/2020   NA 139 08/06/2020   K 3.7 08/06/2020   CL 111 08/06/2020   CREATININE 0.78 08/06/2020   BUN 13 08/06/2020   CO2 22 08/06/2020   TSH 0.673 08/04/2020   PSA 0.26 07/30/2020   INR 1.5 (H) 08/04/2020   HGBA1C 5.4 08/04/2020   MICROALBUR <0.2 04/26/2020      PT/INR:  Recent Labs    08/04/20 1601  LABPROT 17.5*  INR 1.5*   Radiology: DG Chest 1 View  Result Date: 08/06/2020 CLINICAL DATA:  Open-heart surgery. EXAM: CHEST  1 VIEW COMPARISON:  08/05/2020. FINDINGS: Interim removal Swan-Ganz catheter. Right IJ sheath, mediastinal drainage catheter, left chest tube in stable position. Very tiny left apical pneumothorax cannot be excluded. Persistent bibasilar atelectasis. Tiny left pleural effusion cannot be excluded. Prior CABG. Stable cardiomegaly. No pulmonary venous congestion. IMPRESSION: 1. Interim removal of Swan-Ganz catheter. Remaining lines and tubes including left chest tube in stable position. Very tiny left apical pneumothorax cannot be excluded. 2. Persistent bibasilar atelectasis. These results will be called to the ordering clinician or representative by the Radiologist Assistant, and communication documented in the PACS or [employer]. Electronically Signed   By: Marcello Moores  Register   On: 08/06/2020 06:42     Assessment/Plan: S/P Procedure(s) (LRB): CORONARY ARTERY BYPASS GRAFTING (CABG), ON PUMP, TIMES SIX, USING BILATERAL INTERNAL MAMMARY ARTERIES, RIGHT ENDOSCOPICALLY HARVESTED GREATER SAPHENOUS VEIN, AND RIGHT RADIAL ARTERY OPEN HARVEST (N/A) TRANSESOPHAGEAL ECHOCARDIOGRAM (TEE) (N/A) RADIAL ARTERY HARVEST (Right) ENDOVEIN HARVEST OF GREATER SAPHENOUS VEIN (Right) INDOCYANINE GREEN FLUORESCENCE IMAGING (ICG) (N/A)  1. CV- off all drips. NSR in the 70s, BP mostly controlled. NSVT overnight, metoprolol increased.  2. Pulm- tolerating room air with  good oxygen saturation. CXR: Remaining lines and tubes including left chest tube in stable position. Very tiny left apical pneumothorax cannot be excluded. 3. Renal-creatinine 0.78, electrolytes okay.  4. H and H 10.8/32.5, expected acute blood loss anemia 5. Endo-blood glucose well controlled 6. GI- no n/v advancing diet as tolerated  Plan: Keep chest tubes in for now. He did dump a few hundred cc this morning. Remove foley catheter. He has already walked around the unit this morning. Pain is well controlled. May be able to move out of ICU today.      Elgie Collard 08/06/2020 8:28 AM

## 2020-08-06 NOTE — Progress Notes (Signed)
Doing well today. IABP out and off all pressors.   Renal function is good  Having short runs of NSVT  Will increase metoprolol to 25 mg bid.  Resume ACEi today. Benazepril 10 mg daily.   Will need to be on Plavix prior to DC due to ACS.    Pheonix Clinkscale Martin[place] MD, [facility]

## 2020-08-06 NOTE — Discharge Summary (Signed)
[address]       [place] [address]             [phone]      Physician Discharge Summary  Patient ID: Brian Mccullough MRN: [id number] DOB/AGE: [birth year] 69 y.o.  Admit date: 08/03/2020 Discharge date: 08/09/2020  Admission Diagnoses: Patient Active Problem List   Diagnosis Date Noted  . STEMI (ST elevation myocardial infarction) ([place]) 08/03/2020  . STEMI involving right coronary artery ([place]) 08/03/2020  . Trigger thumb of right hand 04/26/2020  . Testicular pain, right 03/18/2018  . Lightheadedness 04/09/2017  . 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 Stress at home 03/27/2016  . Bladder neck obstruction 05/29/2014  . Caries 05/29/2014  . Acute bronchitis 04/19/2014  . Traveler's diarrhea 02/02/2014  . Right inguinal hernia 01/13/2014  . Recurrent left inguinal hernia 01/13/2014  . Rt groin pain 12/30/2013  . Palpitations 06/19/2013  . Right groin pain 03/27/2013  . Well adult exam 10/07/2012  . Hypogonadism male 05/29/2012  . Erectile dysfunction 05/27/2012  . Insomnia 05/29/2011  . WEIGHT GAIN, ABNORMAL 05/30/2010  . Dyslipidemia 01/31/2010  . CHEST PAIN 01/31/2010  . Groin pain, chronic, left 11/02/2009  . LOW BACK PAIN 11/02/2009  . DIZZINESS 03/02/2009  . B12 deficiency 11/02/2008  . ECZEMA 11/02/2008  . Vitamin D deficiency 01/27/2008  . Obesity 01/27/2008  . ANXIETY 10/28/2007  . ALCOHOLISM 10/28/2007  . Osteoarthritis 10/28/2007  . Diabetes mellitus type 2, controlled ([place]) 05/10/2007  . HTN (hypertension) 05/10/2007    Discharge Diagnoses:  Principal Problem:   STEMI involving right coronary artery [facility]) Active Problems:   Diabetes mellitus type 2, controlled ([place])   Dyslipidemia   HTN (hypertension)   STEMI (ST elevation myocardial infarction) ([place])   S/P CABG x 6  Discharged Condition: good  HPI:   69 yo man in USOH until evening of 08/03/20 when he experienced 1st episode of precordial CP. Called EMS after sx  didn't abate. In field, EKG consistent with inferior MI. Pain persisted despite SL NTG x 3; brought to [facility] cath lab where severe multivessel CAD dx'd; culprit lesion was highly disease proximal RCA which was opened with POBA. IABP placed; consult to surgery. Stable overnight. No CP or dysrhythmias.   Hospital Course:   On 08/04/2020,  Mr. Tyler underwent a CABG x 6 with Dr. Orvan Seen. He tolerated the procedure well and was transferred to the surgical ICU in stable condition. Patient was extubated in a timely manner. His balloon pump was removed on 10/28. POD 2 he continued to progress. He was off all drips and his chest tubes were removed. He was walking around the unit. His foley catheter was removed.   Addendum:  He was medically stable for transfer to the progressive care unit.  He had some issues with SVT and his Lopressor dose was titrated accordingly.  He was also hypertensive and restarted on his home Lotensin at a reduced dose.  He remained in NSR.  He pacing wires were removed without difficulty.  He is ambulating independently.  His surgical incisions are healing without evidence of infection.  He is medically stable for discharge home today.       Consults: cardiology  Significant Diagnostic Studies:  Cardiac Cath:  1.  Acute inferior wall STEMI secondary to thrombotic occlusion of the RCA, treated successfully with balloon angioplasty alone 2.  Severe diffuse multivessel coronary artery disease with severe stenosis of the distal left main, ostial LAD, proximal LAD,  ramus intermedius, proximal circumflex, and RCA as detailed above 3.  Mild segmental LV contraction abnormality with basal and mid inferior wall hypokinesis, LVEF estimated at 50 to 55%, mildly elevated LVEDP of 21 mmHg 4. Successful IABP placement under fluoroscopic guidance  Recommendations: Continue intra-aortic balloon pump at one-to-one augmentation, continue tirofiban infusion, cardiac surgical evaluation tomorrow  morning    Treatments:  Date of Procedure:    08/04/20 Preoperative Diagnosis:      Severe 3-vessel Coronary Artery Disease, status post STEMI  Postoperative Diagnosis:    Same  Procedure:        Coronary Artery Bypass Grafting x 6             Left Internal Mammary Artery to Distal Left Anterior Descending Coronary Artery, Saphenous Vein Graft to  Posterior Descending Coronary Artery; left radial artery graft to first obtuse Marginal Branch of Left Circumflex Coronary Artery and ramus intermedius vessel as a sequenced graft; Sapheonous Vein Graft to first diagonal Branch Coronary Artery; pedicled right internal mammary artery to right posterior lateral artery  endoscopic Vein Harvest from right thigh  Open right radial artery harvesting Bilateral internal mammary artery harvesting Completion graft surveillance with indocyanine green fluorescence imaging (spy)  Surgeon:        B.  Murvin Natal, MD  Assistant:       T.  Harriet Pho, PA-C  Anesthesia:    General  Operative Findings: ? Preserved left ventricular systolic function ? Good quality internal mammary artery conduits ? Good quality saphenous vein conduit and right radial artery conduit ? Good quality target vessels for grafting   Discharge Exam: Blood pressure 111/85, pulse 98, temperature 98.7 F (37.1 C), temperature source Oral, resp. rate 18, height 6\' 4"  (1.93 m), weight 101 kg, SpO2 99 %.   General appearance: alert, cooperative and no distress Heart: regular rate and rhythm Lungs: clear to auscultation bilaterally Abdomen: soft, non-tender; bowel sounds normal; no masses,  no organomegaly Extremities: extremities normal, atraumatic, no cyanosis or edema Wound: clean and dry, ecchymosis RLE   Discharge disposition: 01-Home or Self Care  Discharge Instructions    Amb Referral to Cardiac Rehabilitation   Complete by: As directed    Diagnosis:  CABG STEMI PTCA     CABG X ___: 6   After initial  evaluation and assessments completed: Virtual Based Care may be provided alone or in conjunction with Phase 2 Cardiac Rehab based on patient barriers.: Yes     Allergies as of 08/09/2020      Reactions   Codeine Nausea Only      Medication List    TAKE these medications   acetaminophen 500 MG tablet Commonly known as: TYLENOL Take 2 tablets (1,000 mg total) by mouth every 6 (six) hours as needed.   amiodarone 200 MG tablet Commonly known as: PACERONE Take 2 tablets (400 mg total) by mouth 2 (two) times daily. X 7 days, then decrease to 200 mg BID x 7 days, then decrease to 200 mg daily   aspirin 81 MG EC tablet Take 81 mg by mouth daily.   atorvastatin 80 MG tablet Commonly known as: LIPITOR Take 1 tablet (80 mg total) by mouth daily.   benazepril 20 MG tablet Commonly known as: LOTENSIN Take 1 tablet (20 mg total) by mouth daily.   cholecalciferol 25 MCG (1000 UNIT) tablet Commonly known as: VITAMIN D Take 1,000 Units by mouth daily.   clopidogrel 75 MG tablet Commonly known as: PLAVIX Take 1 tablet (75  mg total) by mouth daily.   colchicine 0.6 MG tablet Take 0.5 tablets (0.3 mg total) by mouth 2 (two) times daily.   cyanocobalamin 100 MCG tablet Take 1,000 mcg by mouth daily.   isosorbide dinitrate 10 MG tablet Commonly known as: ISORDIL Take 1 tablet (10 mg total) by mouth 3 (three) times daily.   loratadine 10 MG tablet Commonly known as: CLARITIN Take 10 mg by mouth daily as needed for allergies or rhinitis.   meloxicam 15 MG tablet Commonly known as: MOBIC Take 1 tablet (15 mg total) by mouth daily as needed. for pain What changed:   reasons to take this  additional instructions   metFORMIN 500 MG tablet Commonly known as: GLUCOPHAGE Take 1 tablet (500 mg total) by mouth daily with breakfast.   metoprolol tartrate 50 MG tablet Commonly known as: LOPRESSOR Take 1 tablet (50 mg total) by mouth 2 (two) times daily. What changed:   medication  strength  how much to take  how to take this  when to take this  additional instructions   potassium chloride SA 20 MEQ tablet Commonly known as: KLOR-CON Take 1 tablet (20 mEq total) by mouth daily.   tiZANidine 4 MG tablet Commonly known as: ZANAFLEX TAKE 1 TABLET BY MOUTH  EVERY 8 HOURS AS NEEDED FOR MUSCLE SPASM(S) What changed:   how much to take  how to take this  when to take this  reasons to take this  additional instructions   traMADol 50 MG tablet Commonly known as: ULTRAM TAKE 1 TO 2 TABLETS BY MOUTH TWICE DAILY AS NEEDED What changed:   how much to take  how to take this  when to take this  reasons to take this  additional instructions   traZODone 50 MG tablet Commonly known as: DESYREL TAKE 1/2 TO 1 TABLETS BY  MOUTH AT BEDTIME AS NEEDED  FOR SLEEP.   triamcinolone ointment 0.1 % Commonly known as: KENALOG Apply 1 application topically 2 (two) times daily as needed (rash).       Follow-up Information    [name], Evie Lacks, MD. Call in 1 day(s).   Specialty: Internal Medicine Contact information: [place] [address] [phone]        Richardson Dopp T, PA-C Follow up.   Specialties: Cardiology, Physician Assistant Why: Richardson Dopp, PA-C 11/16 @11 :45 am ([place] [facility])  Contact information: [address] [address] [phone]        Wonda Olds, MD Follow up.   Specialty: Cardiothoracic Surgery Why: You are scheduled for an appointment on 11/8 @9 :30am Contact information: [address] [place] [address] [phone]               The patient has been discharged on:   1.Beta Blocker:  Yes [ X  ]                              No   [   ]                              If No, reason:  2.Ace Inhibitor/ARB: Yes [ X  ]  No  [    ]                                     If No, reason:  3.Statin:   Yes [ X  ]                   No  [   ]                  If No, reason:  4.Ecasa:  Yes  [ X  ]                  No   [   ]                  If No, reason:     Signed:  Note By:  Nicholes Rough PA-C  Addendum by: Ellwood Handler, PA-C 08/09/2020, 8:26 AM

## 2020-08-06 NOTE — Progress Notes (Signed)
CT Surgery PM Rounds  nsr Room air \ walked in hall   Blood pressure 135/81, pulse 83, temperature 99 F (37.2 C), resp. rate 17, height 6\' 4"  (1.93 m), weight 106.3 kg, SpO2 98 %.

## 2020-08-06 NOTE — Progress Notes (Signed)
2 Days Post-Op Procedure(s) (LRB): CORONARY ARTERY BYPASS GRAFTING (CABG), ON PUMP, TIMES SIX, USING BILATERAL INTERNAL MAMMARY ARTERIES, RIGHT ENDOSCOPICALLY HARVESTED GREATER SAPHENOUS VEIN, AND RIGHT RADIAL ARTERY OPEN HARVEST (N/A) TRANSESOPHAGEAL ECHOCARDIOGRAM (TEE) (N/A) RADIAL ARTERY HARVEST (Right) ENDOVEIN HARVEST OF GREATER SAPHENOUS VEIN (Right) INDOCYANINE GREEN FLUORESCENCE IMAGING (ICG) (N/A) Subjective: No complaints Objective: Vital signs in last 24 hours: Temp:  [98.6 F (37 C)-99.7 F (37.6 C)] 98.7 F (37.1 C) (10/29 0700) Pulse Rate:  [69-86] 75 (10/29 0800) Cardiac Rhythm: Normal sinus rhythm (10/29 0800) Resp:  [20-45] 26 (10/29 0800) BP: (97-159)/(62-84) 125/72 (10/29 0800) SpO2:  [95 %-99 %] 97 % (10/29 0800) Arterial Line BP: (128-159)/(53-75) 142/63 (10/28 1430) Weight:  [106.3 kg] 106.3 kg (10/29 0700)  Hemodynamic parameters for last 24 hours: PAP: (14-29)/(4-19) 26/14 CO:  [6.2 L/min] 6.2 L/min CI:  [2.6 L/min/m2] 2.6 L/min/m2  Intake/Output from previous day: 10/28 0701 - 10/29 0700 In: 1373.2 [P.O.:540; I.V.:672.1; IV Piggyback:161.1] Out: 1815 [Urine:1125; Chest Tube:690] Intake/Output this shift: Total I/O In: 47 [I.V.:8.1; IV Piggyback:38.9] Out: 95 [Urine:55; Chest Tube:40]  General appearance: alert and cooperative Neurologic: intact Heart: regular rate and rhythm, S1, S2 normal, no murmur, click, rub or gallop Lungs: clear to auscultation bilaterally Abdomen: soft, non-tender; bowel sounds normal; no masses,  no organomegaly Extremities: extremities normal, atraumatic, no cyanosis or edema Wound: dressed, dry  Lab Results: Recent Labs    08/05/20 1626 08/06/20 0418  WBC 11.7* 10.3  HGB 11.9* 10.8*  HCT 35.8* 32.5*  PLT 117* 100*   BMET:  Recent Labs    08/05/20 1626 08/06/20 0418  NA 139 139  K 4.1 3.7  CL 109 111  CO2 21* 22  GLUCOSE 117* 97  BUN 14 13  CREATININE 0.92 0.78  CALCIUM 8.6* 8.0*    PT/INR:   Recent Labs    08/04/20 1601  LABPROT 17.5*  INR 1.5*   ABG    Component Value Date/Time   PHART 7.323 (L) 08/04/2020 2149   HCO3 21.3 08/04/2020 2149   TCO2 22 08/04/2020 2149   ACIDBASEDEF 4.0 (H) 08/04/2020 2149   O2SAT 99.0 08/04/2020 2149   CBG (last 3)  Recent Labs    08/05/20 1519 08/05/20 2132 08/06/20 0655  GLUCAP 108* 94 81    Assessment/Plan: S/P Procedure(s) (LRB): CORONARY ARTERY BYPASS GRAFTING (CABG), ON PUMP, TIMES SIX, USING BILATERAL INTERNAL MAMMARY ARTERIES, RIGHT ENDOSCOPICALLY HARVESTED GREATER SAPHENOUS VEIN, AND RIGHT RADIAL ARTERY OPEN HARVEST (N/A) TRANSESOPHAGEAL ECHOCARDIOGRAM (TEE) (N/A) RADIAL ARTERY HARVEST (Right) ENDOVEIN HARVEST OF GREATER SAPHENOUS VEIN (Right) INDOCYANINE GREEN FLUORESCENCE IMAGING (ICG) (N/A) d/c tubes/lines  txfer to 2C   LOS: 3 days    Wonda Olds 08/06/2020

## 2020-08-06 NOTE — Progress Notes (Signed)
CARDIAC REHAB PHASE I   PRE:  Rate/Rhythm: 81 SR  BP:  Sitting: 140/77      SaO2: 98 RA  MODE:  Ambulation: 370 ft   POST:  Rate/Rhythm: 99 SR  BP:  Sitting: 136/80    SaO2: 97 RA   Pt ambulated 329ft in hallway assist of one with EVA. Pt denies pain, SOB, or dizziness. Pt returned to recliner. Reinforced importance of IS use, walks, and sternal precautions. Will continue to follow.  [phone] Rufina Falco, RN BSN 08/06/2020 2:39 PM

## 2020-08-06 NOTE — Plan of Care (Signed)

## 2020-08-06 NOTE — Addendum Note (Signed)
Addendum  created 08/06/20 0913 by Roberts Gaudy, MD   Clinical Note Signed

## 2020-08-06 NOTE — Progress Notes (Signed)
Anesthesiology Note:  Awake and alert, neuro intact, minimal pain, walking with assistance, taking PO.  VS: T- 37.1 BP- 116/64 HR- 74 (SR) RR- 26 O2 sat 97% on RA  K- 3.7 BUN/Cr.- 13/0.78 glucose- 108  H/H- 10.8/32.5 Platelets- 62,20  69 year old male, present with inferior wall STEMI on 10/27. Occluded RCA successfully opened with DES but he was found to have diffuse 3V CAD and underwent emergent CABG X 6 by Dr. Orvan Seen.   Extubated 5 hours post-op. Stable post-op course with no apparent complications.  Brian Mccullough

## 2020-08-07 ENCOUNTER — Inpatient Hospital Stay (HOSPITAL_COMMUNITY): Payer: Medicare Other

## 2020-08-07 LAB — BPAM RBC
Blood Product Expiration Date: 202111022359
Blood Product Expiration Date: 202111022359
Blood Product Expiration Date: 202111092359
Blood Product Expiration Date: 202111102359
ISSUE DATE / TIME: 202110260416
ISSUE DATE / TIME: 202110260459
Unit Type and Rh: 6200
Unit Type and Rh: 6200
Unit Type and Rh: 8400
Unit Type and Rh: 8400

## 2020-08-07 LAB — TYPE AND SCREEN
ABO/RH(D): AB POS
Antibody Screen: NEGATIVE
Unit division: 0
Unit division: 0
Unit division: 0
Unit division: 0

## 2020-08-07 LAB — GLUCOSE, CAPILLARY
Glucose-Capillary: 79 mg/dL (ref 70–99)
Glucose-Capillary: 89 mg/dL (ref 70–99)
Glucose-Capillary: 98 mg/dL (ref 70–99)
Glucose-Capillary: 115 mg/dL — ABNORMAL HIGH (ref 70–99)

## 2020-08-07 LAB — BASIC METABOLIC PANEL
Anion gap: 7 (ref 5–15)
BUN: 15 mg/dL (ref 8–23)
CO2: 23 mmol/L (ref 22–32)
Calcium: 8.4 mg/dL — ABNORMAL LOW (ref 8.9–10.3)
Chloride: 109 mmol/L (ref 98–111)
Creatinine, Ser: 0.82 mg/dL (ref 0.61–1.24)
GFR, Estimated: >60 mL/min (ref >60)
Glucose, Bld: 94 mg/dL (ref 70–99)
Potassium: 3.7 mmol/L (ref 3.5–5.1)
Sodium: 139 mmol/L (ref 135–145)

## 2020-08-07 LAB — CBC
HCT: 31.2 % — ABNORMAL LOW (ref 39.0–52.0)
Hemoglobin: 10.6 g/dL — ABNORMAL LOW (ref 13.0–17.0)
MCH: 30.5 pg (ref 26.0–34.0)
MCHC: 34 g/dL (ref 30.0–36.0)
MCV: 89.7 fL (ref 80.0–100.0)
Platelets: 122 10*3/uL — ABNORMAL LOW (ref 150–400)
RBC: 3.48 MIL/uL — ABNORMAL LOW (ref 4.22–5.81)
RDW: 12.9 % (ref 11.5–15.5)
WBC: 10 10*3/uL (ref 4.0–10.5)
nRBC: 0 % (ref 0.0–0.2)

## 2020-08-07 MED ORDER — POTASSIUM CHLORIDE CRYS ER 20 MEQ PO TBCR
20.0000 meq | EXTENDED_RELEASE_TABLET | Freq: Two times a day (BID) | ORAL | Status: DC
  Administered 2020-08-07 – 2020-08-09 (×5): 20 meq via ORAL
  Filled 2020-08-07 (×5): qty 1

## 2020-08-07 MED ORDER — METOPROLOL TARTRATE 50 MG PO TABS
50.0000 mg | ORAL_TABLET | Freq: Two times a day (BID) | ORAL | Status: DC
  Administered 2020-08-07 – 2020-08-09 (×4): 50 mg via ORAL
  Filled 2020-08-07 (×4): qty 1

## 2020-08-07 MED ORDER — METOPROLOL TARTRATE 25 MG/10 ML ORAL SUSPENSION: 25.0000 mg | Freq: Two times a day (BID) | ORAL | Status: DC

## 2020-08-07 NOTE — Plan of Care (Signed)

## 2020-08-07 NOTE — Progress Notes (Signed)
CARDIAC REHAB PHASE I   PRE:  Rate/Rhythm: 96 SR  BP:  Supine:   Sitting: 112/75  Standing:    SaO2: 99%RA  MODE:  Ambulation: 340 ft   POST:  Rate/Rhythm: 108 ST  BP:  Supine:   Sitting: 99/71  Standing:    SaO2: 98%RA 1100-1140 Pt walked 340 ft on RA with rolling walker with steady gait. HR to 108 ST. To recliner after walk. Discussed CRP 2 and referred to [place]. Discussed staying in the tube and sternal precautions. Encouraged IS. Gave [facility] booklet and heart healthy diet and staying in the tube handout. Encouraged three walks a day with staff.   Graylon Good, RN BSN  08/07/2020 11:34 AM

## 2020-08-07 NOTE — Progress Notes (Addendum)
[address]       [place] [address]             [phone]      3 Days Post-Op Procedure(s) (LRB): CORONARY ARTERY BYPASS GRAFTING (CABG), ON PUMP, TIMES SIX, USING BILATERAL INTERNAL MAMMARY ARTERIES, RIGHT ENDOSCOPICALLY HARVESTED GREATER SAPHENOUS VEIN, AND RIGHT RADIAL ARTERY OPEN HARVEST (N/A) TRANSESOPHAGEAL ECHOCARDIOGRAM (TEE) (N/A) RADIAL ARTERY HARVEST (Right) ENDOVEIN HARVEST OF GREATER SAPHENOUS VEIN (Right) INDOCYANINE GREEN FLUORESCENCE IMAGING (ICG) (N/A) Subjective: Feels well   Objective: Vital signs in last 24 hours: Temp:  [98.3 F (36.8 C)-99.3 F (37.4 C)] 98.3 F (36.8 C) (10/30 0751) Pulse Rate:  [72-101] 101 (10/30 0751) Cardiac Rhythm: Normal sinus rhythm (10/30 0700) Resp:  [17-25] 17 (10/30 0751) BP: (111-149)/(61-88) 144/67 (10/30 0751) SpO2:  [93 %-100 %] 98 % (10/30 0751) Weight:  [105.1 kg-106.3 kg] 105.1 kg (10/30 2130)  Hemodynamic parameters for last 24 hours:    Intake/Output from previous day: 10/29 0701 - 10/30 0700 In: 176 [P.O.:100; I.V.:37; IV Piggyback:38.9] Out: 2661 [Urine:2530; Stool:1; Chest Tube:130] Intake/Output this shift: Total I/O In: 200 [P.O.:200] Out: 200 [Urine:200]  General appearance: alert, cooperative and no distress Heart: regular rate and rhythm Lungs: clear to auscultation bilaterally Abdomen: benign Extremities: + edema Wound: dressings CDI  Lab Results: Recent Labs    08/06/20 0418 08/07/20 0134  WBC 10.3 10.0  HGB 10.8* 10.6*  HCT 32.5* 31.2*  PLT 100* 122*   BMET:  Recent Labs    08/06/20 0418 08/07/20 0134  NA 139 139  K 3.7 3.7  CL 111 109  CO2 22 23  GLUCOSE 97 94  BUN 13 15  CREATININE 0.78 0.82  CALCIUM 8.0* 8.4*    PT/INR:  Recent Labs    08/04/20 1601  LABPROT 17.5*  INR 1.5*   ABG    Component Value Date/Time   PHART 7.323 (L) 08/04/2020 2149   HCO3 21.3 08/04/2020 2149   TCO2 22 08/04/2020 2149   ACIDBASEDEF 4.0 (H) 08/04/2020 2149     O2SAT 99.0 08/04/2020 2149   CBG (last 3)  Recent Labs    08/06/20 1606 08/06/20 2150 08/07/20 0618  GLUCAP 99 88 79    Meds Scheduled Meds: . acetaminophen  1,000 mg Oral Q6H   Or  . acetaminophen (TYLENOL) oral liquid 160 mg/5 mL  1,000 mg Per Tube Q6H  . aspirin EC  81 mg Oral Daily  . atorvastatin  80 mg Oral Daily  . benazepril  10 mg Oral Daily  . bisacodyl  10 mg Oral Daily   Or  . bisacodyl  10 mg Rectal Daily  . Chlorhexidine Gluconate Cloth  6 each Topical Daily  . clopidogrel  75 mg Oral Daily  . colchicine  0.3 mg Oral BID  . docusate sodium  200 mg Oral Daily  . furosemide  40 mg Intravenous BID  . insulin aspart  0-24 Units Subcutaneous TID AC & HS  . isosorbide dinitrate  10 mg Oral TID  . metoprolol tartrate  25 mg Oral BID   Or  . metoprolol tartrate  25 mg Per Tube BID  . pantoprazole  40 mg Oral Daily  . sodium chloride flush  10-40 mL Intracatheter Q12H  . sodium chloride flush  3 mL Intravenous Q12H   Continuous Infusions: . sodium chloride Stopped (08/05/20 1432)  . sodium chloride    . sodium chloride Stopped (08/06/20 1035)  . lactated ringers    .  lactated ringers    . lactated ringers Stopped (08/05/20 1429)   PRN Meds:.sodium chloride, dextrose, fentaNYL (SUBLIMAZE) injection, lactated ringers, metoprolol tartrate, ondansetron (ZOFRAN) IV, sodium chloride flush, sodium chloride flush, traMADol  Xrays DG Chest 1 View  Result Date: 08/06/2020 CLINICAL DATA:  Open-heart surgery. EXAM: CHEST  1 VIEW COMPARISON:  08/05/2020. FINDINGS: Interim removal Swan-Ganz catheter. Right IJ sheath, mediastinal drainage catheter, left chest tube in stable position. Very tiny left apical pneumothorax cannot be excluded. Persistent bibasilar atelectasis. Tiny left pleural effusion cannot be excluded. Prior CABG. Stable cardiomegaly. No pulmonary venous congestion. IMPRESSION: 1. Interim removal of Swan-Ganz catheter. Remaining lines and tubes including left  chest tube in stable position. Very tiny left apical pneumothorax cannot be excluded. 2. Persistent bibasilar atelectasis. These results will be called to the ordering clinician or representative by the Radiologist Assistant, and communication documented in the PACS or [employer]. Electronically Signed   By: Marcello Moores  Register   On: 08/06/2020 06:42   DG Chest 2 View  Result Date: 08/07/2020 CLINICAL DATA:  CABG surgery on 08/04/2020.  Follow-up exam. EXAM: CHEST - 2 VIEW COMPARISON:  08/06/2020 and earlier studies. FINDINGS: Sliver of a pneumothorax noted along the lateral left upper hemithorax, decreased in size from the previous day's study. Left chest tube and mediastinal tube and right internal jugular introducer sheath have been removed. Opacity at the lung bases consistent with atelectasis, similar on the left and mildly improved on the right compared to the previous day's study. Remainder of the lungs is clear. Cardiac silhouette normal in size.  No mediastinal widening. IMPRESSION: 1. Previously noted tiny left apical pneumothorax has decreased, minimally apparent the current exam. 2. Status post removal of the remaining support apparatus. 3. Mild basilar atelectasis, greater on the left, improved on the right from the previous day's exam. Remainder of the lungs is clear. Electronically Signed   By: Lajean Manes M.D.   On: 08/07/2020 10:02    Assessment/Plan: S/P Procedure(s) (LRB): CORONARY ARTERY BYPASS GRAFTING (CABG), ON PUMP, TIMES SIX, USING BILATERAL INTERNAL MAMMARY ARTERIES, RIGHT ENDOSCOPICALLY HARVESTED GREATER SAPHENOUS VEIN, AND RIGHT RADIAL ARTERY OPEN HARVEST (N/A) TRANSESOPHAGEAL ECHOCARDIOGRAM (TEE) (N/A) RADIAL ARTERY HARVEST (Right) ENDOVEIN HARVEST OF GREATER SAPHENOUS VEIN (Right) INDOCYANINE GREEN FLUORESCENCE IMAGING (ICG) (N/A)  POD#3 1 + systolic HTN ( fairly mild with SBP sometimes in 140"s), no fevers, sinus rhythm with short episodes of supraventricular  tachy into 140's- will increase metoprolol dose , cont ACE-I, isosorbide for radial 2 sats good on RA 3 stable expected ABLA 4 normal renal fxn, good UOP. Weight trending down, on BID IV lasix 5 no leukocytosis 6 thrombocytopenia trend improving 7 BS well controlled, hold on resuming metformin for now 8 replace K+ to >4.0 9 routine rehab and pulm toilet 10 poss home 1-2 days, will leave wires one more day  LOS: 4 days    John Giovanni PA-C Pager [phone] 08/07/2020  Doing very well after emergency CABG Sinus rhythm, room air, incisions clean and dry Chest x-ray today reviewed with clear lung fields no pleural effusion Continue IV Lasix today  patient examined and medical record reviewed,agree with above note. Tharon Aquas Trigt III 08/07/2020

## 2020-08-08 LAB — BASIC METABOLIC PANEL
Anion gap: 7 (ref 5–15)
BUN: 12 mg/dL (ref 8–23)
CO2: 23 mmol/L (ref 22–32)
Calcium: 8.5 mg/dL — ABNORMAL LOW (ref 8.9–10.3)
Chloride: 110 mmol/L (ref 98–111)
Creatinine, Ser: 0.85 mg/dL (ref 0.61–1.24)
GFR, Estimated: >60 mL/min (ref >60)
Glucose, Bld: 104 mg/dL — ABNORMAL HIGH (ref 70–99)
Potassium: 3.6 mmol/L (ref 3.5–5.1)
Sodium: 140 mmol/L (ref 135–145)

## 2020-08-08 LAB — GLUCOSE, CAPILLARY
Glucose-Capillary: 107 mg/dL — ABNORMAL HIGH (ref 70–99)
Glucose-Capillary: 95 mg/dL (ref 70–99)
Glucose-Capillary: 105 mg/dL — ABNORMAL HIGH (ref 70–99)
Glucose-Capillary: 165 mg/dL — ABNORMAL HIGH (ref 70–99)

## 2020-08-08 MED ORDER — AMIODARONE HCL 200 MG PO TABS
400.0000 mg | ORAL_TABLET | Freq: Two times a day (BID) | ORAL | Status: DC
  Administered 2020-08-08 – 2020-08-09 (×2): 400 mg via ORAL
  Filled 2020-08-08 (×2): qty 2

## 2020-08-08 MED ORDER — METOPROLOL TARTRATE 5 MG/5ML IV SOLN
5.0000 mg | Freq: Once | INTRAVENOUS | Status: AC
  Administered 2020-08-08: 5 mg via INTRAVENOUS
  Filled 2020-08-08: qty 5

## 2020-08-08 NOTE — Plan of Care (Signed)

## 2020-08-08 NOTE — Progress Notes (Addendum)
[address]       [place] [address]             [phone]      4 Days Post-Op Procedure(s) (LRB): CORONARY ARTERY BYPASS GRAFTING (CABG), ON PUMP, TIMES SIX, USING BILATERAL INTERNAL MAMMARY ARTERIES, RIGHT ENDOSCOPICALLY HARVESTED GREATER SAPHENOUS VEIN, AND RIGHT RADIAL ARTERY OPEN HARVEST (N/A) TRANSESOPHAGEAL ECHOCARDIOGRAM (TEE) (N/A) RADIAL ARTERY HARVEST (Right) ENDOVEIN HARVEST OF GREATER SAPHENOUS VEIN (Right) INDOCYANINE GREEN FLUORESCENCE IMAGING (ICG) (N/A) Subjective: Feels well overall  Objective: Vital signs in last 24 hours: Temp:  [98.3 F (36.8 C)-99.7 F (37.6 C)] 98.9 F (37.2 C) (10/31 0736) Pulse Rate:  [74-97] 88 (10/31 0736) Cardiac Rhythm: Normal sinus rhythm (10/31 0700) Resp:  [16-20] 19 (10/31 0736) BP: (99-142)/(71-90) 141/78 (10/31 0736) SpO2:  [95 %-99 %] 98 % (10/31 0736) Weight:  [102.8 kg] 102.8 kg (10/31 0644)  Hemodynamic parameters for last 24 hours:    Intake/Output from previous day: 10/30 0701 - 10/31 0700 In: 840 [P.O.:840] Out: 1100 [Urine:1100] Intake/Output this shift: Total I/O In: 240 [P.O.:240] Out: 300 [Urine:300]  General appearance: alert, cooperative and no distress Heart: regular rate and rhythm Lungs: clear to auscultation bilaterally Abdomen: benign Extremities: minor edema Wound: incis LE/radial realing well, chest dressing to be removed today  Lab Results: Recent Labs    08/06/20 0418 08/07/20 0134  WBC 10.3 10.0  HGB 10.8* 10.6*  HCT 32.5* 31.2*  PLT 100* 122*   BMET:  Recent Labs    08/07/20 0134 08/08/20 0127  NA 139 140  K 3.7 3.6  CL 109 110  CO2 23 23  GLUCOSE 94 104*  BUN 15 12  CREATININE 0.82 0.85  CALCIUM 8.4* 8.5*    PT/INR: No results for input(s): LABPROT, INR in the last 72 hours. ABG    Component Value Date/Time   PHART 7.323 (L) 08/04/2020 2149   HCO3 21.3 08/04/2020 2149   TCO2 22 08/04/2020 2149   ACIDBASEDEF 4.0 (H) 08/04/2020 2149    O2SAT 99.0 08/04/2020 2149   CBG (last 3)  Recent Labs    08/07/20 1615 08/07/20 2115 08/08/20 0633  GLUCAP 98 115* 107*    Meds Scheduled Meds: . acetaminophen  1,000 mg Oral Q6H   Or  . acetaminophen (TYLENOL) oral liquid 160 mg/5 mL  1,000 mg Per Tube Q6H  . aspirin EC  81 mg Oral Daily  . atorvastatin  80 mg Oral Daily  . benazepril  10 mg Oral Daily  . bisacodyl  10 mg Oral Daily   Or  . bisacodyl  10 mg Rectal Daily  . Chlorhexidine Gluconate Cloth  6 each Topical Daily  . clopidogrel  75 mg Oral Daily  . colchicine  0.3 mg Oral BID  . docusate sodium  200 mg Oral Daily  . furosemide  40 mg Intravenous BID  . insulin aspart  0-24 Units Subcutaneous TID AC & HS  . isosorbide dinitrate  10 mg Oral TID  . metoprolol tartrate  50 mg Oral BID   Or  . metoprolol tartrate  25 mg Per Tube BID  . pantoprazole  40 mg Oral Daily  . potassium chloride  20 mEq Oral BID  . sodium chloride flush  10-40 mL Intracatheter Q12H  . sodium chloride flush  3 mL Intravenous Q12H   Continuous Infusions: . sodium chloride Stopped (08/05/20 1432)  . sodium chloride    . sodium chloride Stopped (08/06/20 1035)  .  lactated ringers    . lactated ringers    . lactated ringers Stopped (08/05/20 1429)   PRN Meds:.sodium chloride, dextrose, fentaNYL (SUBLIMAZE) injection, lactated ringers, metoprolol tartrate, ondansetron (ZOFRAN) IV, sodium chloride flush, sodium chloride flush, traMADol  Xrays DG Chest 2 View  Result Date: 08/07/2020 CLINICAL DATA:  CABG surgery on 08/04/2020.  Follow-up exam. EXAM: CHEST - 2 VIEW COMPARISON:  08/06/2020 and earlier studies. FINDINGS: Sliver of a pneumothorax noted along the lateral left upper hemithorax, decreased in size from the previous day's study. Left chest tube and mediastinal tube and right internal jugular introducer sheath have been removed. Opacity at the lung bases consistent with atelectasis, similar on the left and mildly improved on the  right compared to the previous day's study. Remainder of the lungs is clear. Cardiac silhouette normal in size.  No mediastinal widening. IMPRESSION: 1. Previously noted tiny left apical pneumothorax has decreased, minimally apparent the current exam. 2. Status post removal of the remaining support apparatus. 3. Mild basilar atelectasis, greater on the left, improved on the right from the previous day's exam. Remainder of the lungs is clear. Electronically Signed   By: Lajean Manes M.D.   On: 08/07/2020 10:02    Assessment/Plan: S/P Procedure(s) (LRB): CORONARY ARTERY BYPASS GRAFTING (CABG), ON PUMP, TIMES SIX, USING BILATERAL INTERNAL MAMMARY ARTERIES, RIGHT ENDOSCOPICALLY HARVESTED GREATER SAPHENOUS VEIN, AND RIGHT RADIAL ARTERY OPEN HARVEST (N/A) TRANSESOPHAGEAL ECHOCARDIOGRAM (TEE) (N/A) RADIAL ARTERY HARVEST (Right) ENDOVEIN HARVEST OF GREATER SAPHENOUS VEIN (Right) INDOCYANINE GREEN FLUORESCENCE IMAGING (ICG) (N/A) POD#4 1 some borderline systolic HTN, sinus rhytm 2 tmax 99.7 3 sats good on RA 4 normal renal fxn, good UOP- will reduce lasix to am dose only for today 5 BS well controlled 6 d/c wires 7 poss home in am  LOS: 5 days    John Giovanni PA-C Pager [phone] 08/08/2020  Minus rhythm on room air Moving very well labs stable, epicardial wires removed today For discharge on Monday-discharge instructions reviewed with patient.  patient examined and medical record reviewed,agree with above note. Tharon Aquas Trigt III 08/08/2020

## 2020-08-09 ENCOUNTER — Other Ambulatory Visit: Payer: Self-pay | Admitting: Physician Assistant

## 2020-08-09 LAB — GLUCOSE, CAPILLARY: Glucose-Capillary: 91 mg/dL (ref 70–99)

## 2020-08-09 MED ORDER — AMIODARONE HCL 200 MG PO TABS
400.0000 mg | ORAL_TABLET | Freq: Two times a day (BID) | ORAL | 1 refills | Status: DC
Start: 2020-08-09 — End: 2020-08-24

## 2020-08-09 MED ORDER — ISOSORBIDE DINITRATE 10 MG PO TABS
10.0000 mg | ORAL_TABLET | Freq: Three times a day (TID) | ORAL | 0 refills | Status: DC
Start: 2020-08-09 — End: 2020-09-27

## 2020-08-09 MED ORDER — ATORVASTATIN CALCIUM 80 MG PO TABS
80.0000 mg | ORAL_TABLET | Freq: Every day | ORAL | 0 refills | Status: DC
Start: 2020-08-09 — End: 2020-08-09

## 2020-08-09 MED ORDER — COLCHICINE 0.6 MG PO TABS
0.3000 mg | ORAL_TABLET | Freq: Two times a day (BID) | ORAL | 0 refills | Status: DC
Start: 2020-08-09 — End: 2020-08-17

## 2020-08-09 MED ORDER — CLOPIDOGREL BISULFATE 75 MG PO TABS
75.0000 mg | ORAL_TABLET | Freq: Every day | ORAL | 3 refills | Status: DC
Start: 2020-08-09 — End: 2020-11-29

## 2020-08-09 MED ORDER — ATORVASTATIN CALCIUM 80 MG PO TABS
80.0000 mg | ORAL_TABLET | Freq: Every day | ORAL | 3 refills | Status: DC
Start: 2020-08-09 — End: 2020-11-29

## 2020-08-09 MED ORDER — METOPROLOL TARTRATE 50 MG PO TABS
50.0000 mg | ORAL_TABLET | Freq: Two times a day (BID) | ORAL | 3 refills | Status: DC
Start: 2020-08-09 — End: 2020-08-24

## 2020-08-09 MED ORDER — ACETAMINOPHEN 500 MG PO TABS
1000.0000 mg | ORAL_TABLET | Freq: Four times a day (QID) | ORAL | 0 refills | Status: DC | PRN
Start: 2020-08-09 — End: 2020-10-04

## 2020-08-09 MED ORDER — CLOPIDOGREL BISULFATE 75 MG PO TABS
75.0000 mg | ORAL_TABLET | Freq: Every day | ORAL | 0 refills | Status: DC
Start: 2020-08-09 — End: 2020-08-09

## 2020-08-09 MED ORDER — METOPROLOL TARTRATE 50 MG PO TABS
50.0000 mg | ORAL_TABLET | Freq: Two times a day (BID) | ORAL | 3 refills | Status: DC
Start: 2020-08-09 — End: 2020-08-09

## 2020-08-09 MED FILL — Heparin Sodium (Porcine) Inj 1000 Unit/ML: INTRAMUSCULAR | Qty: 10 | Status: AC

## 2020-08-09 MED FILL — Lidocaine HCl Local Soln Prefilled Syringe 100 MG/5ML (2%): INTRAMUSCULAR | Qty: 5 | Status: AC

## 2020-08-09 MED FILL — Thrombin For Soln 5000 Unit: CUTANEOUS | Qty: 5000 | Status: AC

## 2020-08-09 MED FILL — Electrolyte-R (PH 7.4) Solution: INTRAVENOUS | Qty: 3000 | Status: AC

## 2020-08-09 MED FILL — Calcium Chloride Inj 10%: INTRAVENOUS | Qty: 10 | Status: AC

## 2020-08-09 MED FILL — Sodium Bicarbonate IV Soln 8.4%: INTRAVENOUS | Qty: 50 | Status: AC

## 2020-08-09 MED FILL — Sodium Chloride IV Soln 0.9%: INTRAVENOUS | Qty: 3000 | Status: AC

## 2020-08-09 MED FILL — Mannitol IV Soln 20%: INTRAVENOUS | Qty: 500 | Status: AC

## 2020-08-09 NOTE — Care Management Important Message (Signed)
Important Message  Patient Details  Name: Brian Mccullough MRN: [id number] Date of Birth: 07-22-1951   Medicare Important Message Given:  Yes     Orbie Pyo 08/09/2020, 3:54 PM

## 2020-08-09 NOTE — Progress Notes (Signed)
Patient discharge instructions reviewed and questions answered.  Patient aware of follow up appointments and medication usage. Written instructions given.  Patient via wheelchair in stable condition to waiting vehicle.

## 2020-08-09 NOTE — Progress Notes (Signed)
[id number] Completed education with pt who voiced understanding. Reviewed sternal precautions and staying in the tube. Encouraged IS and walking for exercise. Referring to [facility] CRP 2. Gave diabetic and heart healthy diets. Pt stated he has been eating heart healthy since wife has valve surgery 10 years ago. Discussed tobacco cessation for cigars. Gave handout. Pt stated he has quit.  Observed pt up in room without walker and gait steady. Graylon Good RN BSN 08/09/2020 8:42 AM

## 2020-08-09 NOTE — Progress Notes (Signed)
      [address]       [place] [address]             [phone]      5 Days Post-Op Procedure(s) (LRB): CORONARY ARTERY BYPASS GRAFTING (CABG), ON PUMP, TIMES SIX, USING BILATERAL INTERNAL MAMMARY ARTERIES, RIGHT ENDOSCOPICALLY HARVESTED GREATER SAPHENOUS VEIN, AND RIGHT RADIAL ARTERY OPEN HARVEST (N/A) TRANSESOPHAGEAL ECHOCARDIOGRAM (TEE) (N/A) RADIAL ARTERY HARVEST (Right) ENDOVEIN HARVEST OF GREATER SAPHENOUS VEIN (Right) INDOCYANINE GREEN FLUORESCENCE IMAGING (ICG) (N/A)   Subjective:  No new complaints.  He states he is feeling good and is happy to get to go home today.  + ambulation  + BM  Objective: Vital signs in last 24 hours: Temp:  [98.6 F (37 C)-98.9 F (37.2 C)] 98.7 F (37.1 C) (11/01 0753) Pulse Rate:  [79-98] 98 (11/01 0753) Cardiac Rhythm: Normal sinus rhythm;Bundle branch block (11/01 0700) Resp:  [17-20] 18 (11/01 0753) BP: (111-153)/(74-93) 111/85 (11/01 0753) SpO2:  [97 %-99 %] 99 % (11/01 0753) Weight:  [101 kg] 101 kg (11/01 0323)  Intake/Output from previous day: 10/31 0701 - 11/01 0700 In: 923 [P.O.:920; I.V.:3] Out: 1250 [Urine:1250]  General appearance: alert, cooperative and no distress Heart: regular rate and rhythm Lungs: clear to auscultation bilaterally Abdomen: soft, non-tender; bowel sounds normal; no masses,  no organomegaly Extremities: extremities normal, atraumatic, no cyanosis or edema Wound: clean and dry, ecchymosis RLE  Lab Results: Recent Labs    08/07/20 0134  WBC 10.0  HGB 10.6*  HCT 31.2*  PLT 122*   BMET:  Recent Labs    08/07/20 0134 08/08/20 0127  NA 139 140  K 3.7 3.6  CL 109 110  CO2 23 23  GLUCOSE 94 104*  BUN 15 12  CREATININE 0.82 0.85  CALCIUM 8.4* 8.5*    PT/INR: No results for input(s): LABPROT, INR in the last 72 hours. ABG    Component Value Date/Time   PHART 7.323 (L) 08/04/2020 2149   HCO3 21.3 08/04/2020 2149   TCO2 22 08/04/2020 2149   ACIDBASEDEF 4.0 (H)  08/04/2020 2149   O2SAT 99.0 08/04/2020 2149   CBG (last 3)  Recent Labs    08/08/20 1621 08/08/20 2114 08/09/20 0614  GLUCAP 105* 165* 91    Assessment/Plan: S/P Procedure(s) (LRB): CORONARY ARTERY BYPASS GRAFTING (CABG), ON PUMP, TIMES SIX, USING BILATERAL INTERNAL MAMMARY ARTERIES, RIGHT ENDOSCOPICALLY HARVESTED GREATER SAPHENOUS VEIN, AND RIGHT RADIAL ARTERY OPEN HARVEST (N/A) TRANSESOPHAGEAL ECHOCARDIOGRAM (TEE) (N/A) RADIAL ARTERY HARVEST (Right) ENDOVEIN HARVEST OF GREATER SAPHENOUS VEIN (Right) INDOCYANINE GREEN FLUORESCENCE IMAGING (ICG) (N/A)  1. CV- PAF, currently in NSR- on Lopressor, Amiodarone, Lotensin 2. Pulm -no acute issues, off oxygen, continue IS 3. Renal- creatinine WNL, weight is trending down, no current need for lasix 4. DM- sugars controlled 5. Dispo- patient stable, will d/c home today   LOS: 6 days    Ellwood Handler, PA-C 08/09/2020

## 2020-08-09 NOTE — Progress Notes (Signed)
Around 2210 pt had an episode of SVT with HR in the 160's after ambulating to the bathroom. Pt could feel heart beating fast but otherwise was asymptomatic. EKG obtained and on-call CVTS, Dr. Prescott Gum, notified. New order obtained. Pt resting in bed HR 70's-80's. Will continue to monitor.

## 2020-08-09 NOTE — Plan of Care (Signed)

## 2020-08-09 NOTE — Care Management Important Message (Signed)
Important Message  Patient Details  Name: Brian Mccullough MRN: [id number] Date of Birth: Feb 17, 1951   Medicare Important Message Given:  Yes  Patient left prior to IM delivery document mailed to the patient address.   Moustafa Mossa 08/09/2020, 3:55 PM

## 2020-08-09 NOTE — Discharge Instructions (Signed)

## 2020-08-13 ENCOUNTER — Other Ambulatory Visit: Payer: Self-pay | Admitting: Cardiothoracic Surgery

## 2020-08-13 DIAGNOSIS — Z951 Presence of aortocoronary bypass graft: Secondary | ICD-10-CM

## 2020-08-16 ENCOUNTER — Ambulatory Visit
Admission: RE | Admit: 2020-08-16 | Discharge: 2020-08-16 | Disposition: A | Discharge status: Home / Self Care | Payer: Medicare Other | Source: Ambulatory Visit | Attending: Thoracic Surgery (Cardiothoracic Vascular Surgery) | Admitting: Thoracic Surgery (Cardiothoracic Vascular Surgery)

## 2020-08-16 ENCOUNTER — Ambulatory Visit: Payer: Self-pay | Admitting: Cardiothoracic Surgery

## 2020-08-16 DIAGNOSIS — J9811 Atelectasis: Secondary | ICD-10-CM | POA: Diagnosis not present

## 2020-08-16 DIAGNOSIS — Z951 Presence of aortocoronary bypass graft: Secondary | ICD-10-CM

## 2020-08-16 DIAGNOSIS — J90 Pleural effusion, not elsewhere classified: Secondary | ICD-10-CM | POA: Diagnosis not present

## 2020-08-16 DIAGNOSIS — M47814 Spondylosis without myelopathy or radiculopathy, thoracic region: Secondary | ICD-10-CM | POA: Diagnosis not present

## 2020-08-17 ENCOUNTER — Encounter: Payer: Self-pay | Admitting: Internal Medicine

## 2020-08-17 ENCOUNTER — Other Ambulatory Visit: Payer: Self-pay

## 2020-08-17 ENCOUNTER — Encounter: Payer: Self-pay | Admitting: Cardiothoracic Surgery

## 2020-08-17 ENCOUNTER — Ambulatory Visit (INDEPENDENT_AMBULATORY_CARE_PROVIDER_SITE_OTHER): Payer: Self-pay | Admitting: Cardiothoracic Surgery

## 2020-08-17 ENCOUNTER — Ambulatory Visit (INDEPENDENT_AMBULATORY_CARE_PROVIDER_SITE_OTHER): Payer: Medicare Other | Admitting: Internal Medicine

## 2020-08-17 VITALS — BP 134/75 | HR 67 | Resp 18 | Ht 76.0 in | Wt 229.2 lb

## 2020-08-17 DIAGNOSIS — E785 Hyperlipidemia, unspecified: Secondary | ICD-10-CM

## 2020-08-17 DIAGNOSIS — Z951 Presence of aortocoronary bypass graft: Secondary | ICD-10-CM

## 2020-08-17 DIAGNOSIS — I25110 Atherosclerotic heart disease of native coronary artery with unstable angina pectoris: Secondary | ICD-10-CM

## 2020-08-17 DIAGNOSIS — E538 Deficiency of other specified B group vitamins: Secondary | ICD-10-CM

## 2020-08-17 DIAGNOSIS — I2510 Atherosclerotic heart disease of native coronary artery without angina pectoris: Secondary | ICD-10-CM | POA: Insufficient documentation

## 2020-08-17 MED ORDER — POTASSIUM CHLORIDE CRYS ER 20 MEQ PO TBCR
20.0000 meq | EXTENDED_RELEASE_TABLET | Freq: Every day | ORAL | 11 refills | Status: DC
Start: 2020-08-17 — End: 2021-06-07

## 2020-08-17 NOTE — Assessment & Plan Note (Signed)
On Lipitor 

## 2020-08-17 NOTE — Assessment & Plan Note (Signed)
CAD/MI/CABG Healing well

## 2020-08-17 NOTE — Progress Notes (Signed)
[address]       [place] [address]             [phone]     CARDIOTHORACIC SURGERY OFFICE NOTE  Referring Provider is Sherren Mocha, MD Primary Cardiologist is No primary care provider on file. PCP is [name], Evie Lacks, MD   HPI:  69 year old gentleman presents for first postoperative visit status post CABG.  He underwent urgent surgery for STEMI.  The culprit was the right coronary artery.  He did well after surgery.  He now denies chest pain or shortness of breath.  His pain is well controlled at home   Current Outpatient Medications  Medication Sig Dispense Refill  . acetaminophen (TYLENOL) 500 MG tablet Take 2 tablets (1,000 mg total) by mouth every 6 (six) hours as needed. 30 tablet 0  . amiodarone (PACERONE) 200 MG tablet Take 2 tablets (400 mg total) by mouth 2 (two) times daily. X 7 days, then decrease to 200 mg BID x 7 days, then decrease to 200 mg daily 90 tablet 1  . aspirin 81 MG EC tablet Take 81 mg by mouth daily.      Marland Kitchen atorvastatin (LIPITOR) 80 MG tablet Take 1 tablet (80 mg total) by mouth daily. 30 tablet 3  . benazepril (LOTENSIN) 20 MG tablet Take 1 tablet (20 mg total) by mouth daily. 90 tablet 3  . Cholecalciferol (VITAMIN D3) 1000 UNITS tablet Take 1,000 Units by mouth daily.     . clopidogrel (PLAVIX) 75 MG tablet Take 1 tablet (75 mg total) by mouth daily. 30 tablet 3  . cyanocobalamin 100 MCG tablet Take 1,000 mcg by mouth daily.     . isosorbide dinitrate (ISORDIL) 10 MG tablet Take 1 tablet (10 mg total) by mouth 3 (three) times daily. 90 tablet 0  . loratadine (CLARITIN) 10 MG tablet Take 10 mg by mouth daily as needed for allergies or rhinitis.    . meloxicam (MOBIC) 15 MG tablet Take 1 tablet (15 mg total) by mouth daily as needed. for pain (Patient taking differently: Take 15 mg by mouth daily as needed for pain. ) 90 tablet 3  . metFORMIN (GLUCOPHAGE) 500 MG tablet Take 1 tablet (500 mg total) by mouth daily with  breakfast. 90 tablet 3  . metoprolol tartrate (LOPRESSOR) 50 MG tablet Take 1 tablet (50 mg total) by mouth 2 (two) times daily. 60 tablet 3  . tiZANidine (ZANAFLEX) 4 MG tablet TAKE 1 TABLET BY MOUTH  EVERY 8 HOURS AS NEEDED FOR MUSCLE SPASM(S) (Patient taking differently: Take 4 mg by mouth every 8 (eight) hours as needed for muscle spasms. ) 270 tablet 0  . traMADol (ULTRAM) 50 MG tablet TAKE 1 TO 2 TABLETS BY MOUTH TWICE DAILY AS NEEDED (Patient taking differently: Take 50-100 mg by mouth 2 (two) times daily as needed for moderate pain. ) 100 tablet 3  . traZODone (DESYREL) 50 MG tablet TAKE 1/2 TO 1 TABLETS BY  MOUTH AT BEDTIME AS NEEDED  FOR SLEEP. 90 tablet 3  . triamcinolone ointment (KENALOG) 0.1 % Apply 1 application topically 2 (two) times daily as needed (rash). 80 g 0  . potassium chloride SA (KLOR-CON) 20 MEQ tablet Take 1 tablet (20 mEq total) by mouth daily. 30 tablet 11   No current facility-administered medications for this visit.      Physical Exam:   BP 134/75 (BP Location: Left Arm, Patient Position: Sitting)   Pulse 67  Resp 18   Ht 6\' 4"  (1.93 m)   Wt 104 kg   SpO2 99% Comment: RA with mask on  BMI 27.90 kg/m   General:  Well-appearing no acute distress  Chest:   Clear to auscultation bilaterally  CV:   Regular rate and rhythm  Incisions:  Healing well  Abdomen:  Soft nontender  Extremities:  Mild edema right greater than left  Diagnostic Tests:  I have personally reviewed his available imaging studies which includes PA and lateral chest x-ray from today which shows a small left pleural effusion   Impression:  Doing well after CABG  Plan:  Follow-up with thoracic surgery as needed Follow-up with Dr. Johnsie Cancel cardiology  I spent in excess of 20 minutes during the conduct of this office consultation and >50% of this time involved direct face-to-face encounter with the patient for counseling and/or coordination of their care.  Level 2                 10  minutes Level 3                 15 minutes Level 4                 25 minutes Level 5                 40 minutes  B.  Murvin Natal, MD 08/17/2020 5:43 PM

## 2020-08-17 NOTE — Progress Notes (Signed)
Subjective:  Patient ID: Brian Mccullough, male    DOB: 06/13/1951  Age: 69 y.o. MRN: [id number]  CC: Follow-up [facility] F/U)   HPI Brian Mccullough presents for a post-hosp f/u - CAD/MI/CABG  Per hx -   "Admit date: 08/03/2020 Discharge date: 08/09/2020  Admission Diagnoses:     Patient Active Problem List   Diagnosis Date Noted  . STEMI (ST elevation myocardial infarction) ([place]) 08/03/2020  . STEMI involving right coronary artery ([place]) 08/03/2020  . Trigger thumb of right hand 04/26/2020  . Testicular pain, right 03/18/2018  . Lightheadedness 04/09/2017  . 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 Stress at home 03/27/2016  . Bladder neck obstruction 05/29/2014  . Caries 05/29/2014  . Acute bronchitis 04/19/2014  . Traveler's diarrhea 02/02/2014  . Right inguinal hernia 01/13/2014  . Recurrent left inguinal hernia 01/13/2014  . Rt groin pain 12/30/2013  . Palpitations 06/19/2013  . Right groin pain 03/27/2013  . Well adult exam 10/07/2012  . Hypogonadism male 05/29/2012  . Erectile dysfunction 05/27/2012  . Insomnia 05/29/2011  . WEIGHT GAIN, ABNORMAL 05/30/2010  . Dyslipidemia 01/31/2010  . CHEST PAIN 01/31/2010  . Groin pain, chronic, left 11/02/2009  . LOW BACK PAIN 11/02/2009  . DIZZINESS 03/02/2009  . B12 deficiency 11/02/2008  . ECZEMA 11/02/2008  . Vitamin D deficiency 01/27/2008  . Obesity 01/27/2008  . ANXIETY 10/28/2007  . ALCOHOLISM 10/28/2007  . Osteoarthritis 10/28/2007  . Diabetes mellitus type 2, controlled ([place]) 05/10/2007  . HTN (hypertension) 05/10/2007    Discharge Diagnoses:  Principal Problem:   STEMI involving right coronary artery [facility]) Active Problems:   Diabetes mellitus type 2, controlled ([place])   Dyslipidemia   HTN (hypertension)   STEMI (ST elevation myocardial infarction) ([place])   S/P CABG x 6  Discharged Condition: good  HPI:   69 yo man in USOH until evening of 08/03/20 when he experienced 1st episode of precordial CP.  Called EMS after sx didn't abate. In field, EKG consistent with inferior MI. Pain persisted despite SL NTG x 3; brought to [facility] cath lab where severe multivessel CAD dx'd; culprit lesion was highly disease proximal RCA which was opened with POBA. IABP placed; consult to surgery. Stable overnight. No CP or dysrhythmias.  Hospital Course:   On 08/04/2020,  [name] underwent a CABG x 6 with Dr. Orvan Seen. He tolerated the procedure well and was transferred to the surgical ICU in stable condition. Patient was extubated in a timely manner. His balloon pump was removed on 10/28. POD 2 he continued to progress. He was off all drips and his chest tubes were removed. He was walking around the unit. His foley catheter was removed.   Addendum:  He was medically stable for transfer to the progressive care unit.  He had some issues with SVT and his Lopressor dose was titrated accordingly.  He was also hypertensive and restarted on his home Lotensin at a reduced dose.  He remained in NSR.  He pacing wires were removed without difficulty.  He is ambulating independently.  His surgical incisions are healing without evidence of infection.  He is medically stable for discharge home today.       Consults: cardiology  Significant Diagnostic Studies:  Cardiac Cath:  1. Acute inferior wall STEMI secondary to thrombotic occlusion of the RCA, treated successfully with balloon angioplasty alone 2. Severe diffuse multivessel coronary artery disease with severe stenosis of the distal left main, ostial LAD, proximal LAD, ramus intermedius, proximal circumflex, and RCA  as detailed above 3. Mild segmental LV contraction abnormality with basal and mid inferior wall hypokinesis, LVEF estimated at 50 to 55%, mildly elevated LVEDP of 21 mmHg 4. Successful IABP placement under fluoroscopic guidance  Recommendations: Continue intra-aortic balloon pump at one-to-one augmentation, continue tirofiban infusion, cardiac  surgical evaluation tomorrow morning    Treatments:  Date of Procedure:08/04/20 Preoperative Diagnosis:Severe 3-vessel Coronary Artery Disease,status post STEMI  Postoperative Diagnosis:Same  Procedure:   Coronary Artery Bypass Grafting x6 Left Internal Mammary Artery to Distal Left Anterior Descending Coronary Artery,Saphenous Vein Graft to Posterior Descending Coronary Artery;left radial artery graft tofirst obtuse Marginal Branch of Left Circumflex Coronary Arteryand ramus intermedius vessel as a sequenced graft;Sapheonous Vein Graft tofirst diagonal Branch Coronary Artery;pedicled right internal mammary artery to right posterior lateral artery  endoscopic Vein Harvest fromright thigh Open right radial artery harvesting Bilateral internal mammary artery harvesting Completion graft surveillance with indocyanine green fluorescence imaging (spy)  Surgeon:B. Murvin Natal, MD  Assistant:T. Harriet Pho, PA-C  Anesthesia:General Operative Findings: ? Preservedleft ventricular systolic function ? Goodquality internal mammary artery conduits ? Goodquality saphenous vein conduit and right radial artery conduit ? Goodquality target vessels for grafting"    Outpatient Medications Prior to Visit  Medication Sig Dispense Refill  . acetaminophen (TYLENOL) 500 MG tablet Take 2 tablets (1,000 mg total) by mouth every 6 (six) hours as needed. 30 tablet 0  . amiodarone (PACERONE) 200 MG tablet Take 2 tablets (400 mg total) by mouth 2 (two) times daily. X 7 days, then decrease to 200 mg BID x 7 days, then decrease to 200 mg daily 90 tablet 1  . aspirin 81 MG EC tablet Take 81 mg by mouth daily.      Marland Kitchen atorvastatin (LIPITOR) 80 MG tablet Take 1 tablet (80 mg total) by mouth daily. 30 tablet 3  . benazepril (LOTENSIN) 20 MG tablet Take 1 tablet (20 mg total) by mouth daily. 90 tablet 3  . Cholecalciferol (VITAMIN D3) 1000  UNITS tablet Take 1,000 Units by mouth daily.     . clopidogrel (PLAVIX) 75 MG tablet Take 1 tablet (75 mg total) by mouth daily. 30 tablet 3  . colchicine 0.6 MG tablet Take 0.5 tablets (0.3 mg total) by mouth 2 (two) times daily. 60 tablet 0  . cyanocobalamin 100 MCG tablet Take 1,000 mcg by mouth daily.     . isosorbide dinitrate (ISORDIL) 10 MG tablet Take 1 tablet (10 mg total) by mouth 3 (three) times daily. 90 tablet 0  . loratadine (CLARITIN) 10 MG tablet Take 10 mg by mouth daily as needed for allergies or rhinitis.    . meloxicam (MOBIC) 15 MG tablet Take 1 tablet (15 mg total) by mouth daily as needed. for pain (Patient taking differently: Take 15 mg by mouth daily as needed for pain. ) 90 tablet 3  . metFORMIN (GLUCOPHAGE) 500 MG tablet Take 1 tablet (500 mg total) by mouth daily with breakfast. 90 tablet 3  . metoprolol tartrate (LOPRESSOR) 50 MG tablet Take 1 tablet (50 mg total) by mouth 2 (two) times daily. 60 tablet 3  . potassium chloride SA (KLOR-CON) 20 MEQ tablet Take 1 tablet (20 mEq total) by mouth daily. 30 tablet 1  . tiZANidine (ZANAFLEX) 4 MG tablet TAKE 1 TABLET BY MOUTH  EVERY 8 HOURS AS NEEDED FOR MUSCLE SPASM(S) (Patient taking differently: Take 4 mg by mouth every 8 (eight) hours as needed for muscle spasms. ) 270 tablet 0  . traMADol (ULTRAM) 50 MG tablet TAKE  1 TO 2 TABLETS BY MOUTH TWICE DAILY AS NEEDED (Patient taking differently: Take 50-100 mg by mouth 2 (two) times daily as needed for moderate pain. ) 100 tablet 3  . traZODone (DESYREL) 50 MG tablet TAKE 1/2 TO 1 TABLETS BY  MOUTH AT BEDTIME AS NEEDED  FOR SLEEP. 90 tablet 3  . triamcinolone ointment (KENALOG) 0.1 % Apply 1 application topically 2 (two) times daily as needed (rash). 80 g 0   No facility-administered medications prior to visit.    ROS: Review of Systems  Constitutional: Negative for appetite change, fatigue and unexpected weight change.  HENT: Negative for congestion, nosebleeds, sneezing,  sore throat and trouble swallowing.   Eyes: Negative for itching and visual disturbance.  Respiratory: Negative for cough.   Cardiovascular: Positive for chest pain. Negative for palpitations and leg swelling.  Gastrointestinal: Negative for abdominal distention, blood in stool, diarrhea and nausea.  Genitourinary: Negative for frequency and hematuria.  Musculoskeletal: Negative for back pain, gait problem, joint swelling and neck pain.  Skin: Negative for rash.  Neurological: Negative for dizziness, tremors, speech difficulty and weakness.  Psychiatric/Behavioral: Negative for agitation, dysphoric mood and sleep disturbance. The patient is not nervous/anxious.     Objective:  BP 138/72 (BP Location: Left Arm)   Pulse (!) 55   Temp 98.5 F (36.9 C) (Oral)   Wt 229 lb (103.9 kg)   SpO2 97%   BMI 27.87 kg/m   BP Readings from Last 3 Encounters:  08/17/20 138/72  08/17/20 134/75  08/09/20 111/85    Wt Readings from Last 3 Encounters:  08/17/20 229 lb (103.9 kg)  08/17/20 229 lb 3.2 oz (104 kg)  08/09/20 222 lb 11.2 oz (101 kg)    Physical Exam Constitutional:      General: He is not in acute distress.    Appearance: He is well-developed.     Comments: NAD  Eyes:     Conjunctiva/sclera: Conjunctivae normal.     Pupils: Pupils are equal, round, and reactive to light.  Neck:     Thyroid: No thyromegaly.     Vascular: No JVD.  Cardiovascular:     Rate and Rhythm: Normal rate and regular rhythm.     Heart sounds: Normal heart sounds. No murmur heard.  No friction rub. No gallop.   Pulmonary:     Effort: Pulmonary effort is normal. No respiratory distress.     Breath sounds: Normal breath sounds. No wheezing or rales.  Chest:     Chest wall: No tenderness.  Abdominal:     General: Bowel sounds are normal. There is no distension.     Palpations: Abdomen is soft. There is no mass.     Tenderness: There is no abdominal tenderness. There is no guarding or rebound.    Musculoskeletal:        General: Tenderness present. Normal range of motion.     Cervical back: Normal range of motion.  Lymphadenopathy:     Cervical: No cervical adenopathy.  Skin:    General: Skin is warm and dry.     Findings: No rash.  Neurological:     Mental Status: He is alert and oriented to person, place, and time.     Cranial Nerves: No cranial nerve deficit.     Motor: No abnormal muscle tone.     Coordination: Coordination normal.     Gait: Gait normal.     Deep Tendon Reflexes: Reflexes are normal and symmetric.  Psychiatric:  Behavior: Behavior normal.        Thought Content: Thought content normal.        Judgment: Judgment normal.   scars - chest, L arm - healing RLE w/trace edema, NT   Lab Results  Component Value Date   WBC 10.0 08/07/2020   HGB 10.6 (L) 08/07/2020   HCT 31.2 (L) 08/07/2020   PLT 122 (L) 08/07/2020   GLUCOSE 104 (H) 08/08/2020   CHOL 171 08/04/2020   TRIG 68 08/04/2020   HDL 37 (L) 08/04/2020   LDLDIRECT 88.1 01/23/2011   LDLCALC 120 (H) 08/04/2020   ALT 13 08/04/2020   AST 47 (H) 08/04/2020   NA 140 08/08/2020   K 3.6 08/08/2020   CL 110 08/08/2020   CREATININE 0.85 08/08/2020   BUN 12 08/08/2020   CO2 23 08/08/2020   TSH 0.673 08/04/2020   PSA 0.26 07/30/2020   INR 1.5 (H) 08/04/2020   HGBA1C 5.4 08/04/2020   MICROALBUR <0.2 04/26/2020    DG Chest 2 View  Result Date: 08/16/2020 CLINICAL DATA:  Status post coronary artery bypass grafting EXAM: CHEST - 2 VIEW COMPARISON:  August 07, 2020 FINDINGS: Small left pleural effusion noted with apparent atelectatic change in the left base. Lungs elsewhere clear. Heart size and pulmonary vascularity are normal. No adenopathy. Status post coronary artery bypass grafting. No adenopathy. There is degenerative change in the thoracic spine. IMPRESSION: Small left pleural effusion with left base atelectasis. Lungs elsewhere clear. Cardiac silhouette within normal limits. Postoperative  changes noted. Electronically Signed   By: Lowella Grip III M.D.   On: 08/16/2020 13:24    Assessment & Plan:    Walker Kehr, MD

## 2020-08-17 NOTE — Assessment & Plan Note (Signed)
On B12 

## 2020-08-17 NOTE — Assessment & Plan Note (Signed)
Healing well.

## 2020-08-18 ENCOUNTER — Telehealth (HOSPITAL_COMMUNITY): Payer: Self-pay

## 2020-08-18 NOTE — Telephone Encounter (Signed)
Attempted to call patient in regards to Cardiac Rehab - LM on VM 

## 2020-08-18 NOTE — Telephone Encounter (Signed)
Pt insurance is active and benefits verified through [facility] Medicare. Co-pay $0.00, DED $0.00/$0.00 met, out of pocket $3,600.00/$1.15 met, co-insurance 0%. No pre-authorization required. Passport, 08/18/20 @ 2:41PM, XMD#[id number]  Will contact patient to see if he is interested in the Cardiac Rehab Program. If interested, patient will need to complete follow up appt. Once completed, patient will be contacted for scheduling upon review by the RN Navigator.

## 2020-08-23 ENCOUNTER — Encounter: Payer: Self-pay | Admitting: Internal Medicine

## 2020-08-23 NOTE — Progress Notes (Signed)
Cardiology Office Note:    Date:  08/24/2020   ID:  Brian Mccullough, DOB 1951-05-31, MRN [id number]  PCP:  Cassandria Anger, MD  [facility] Cardiologist:  Sherren Mocha, MD   [place] Electrophysiologist:  None   Referring MD: Cassandria Anger, MD   Chief Complaint:  Hospitalization Follow-up (s/p MI >> CABG)    Patient Profile:    Brian Mccullough is a 69 y.o. male with:   Coronary artery disease  S/p Inf STEMI >> PCI: POBA to RCA  S/p CABG 10/21 (L-LAD, S-PDA, L radial-OM1/RI, S-D1, RIMA-RPLA)  Post AFib >> Amiodarone   Carotid artery disease  [place] 10/21: R 1-39; L 40-59  Diabetes mellitus   Hypertension   Hyperlipidemia   SVT   Prior CV studies: Cardiac catheterization August 28, 2020 LM mid 70 LAD prox 80; D1 80 RI 75 LCx prox 70 RCA prox 95, mid 75; RPDA 90, 100 EF 50-55. Inf HK PCI:  POBA to prox RCA   Echocardiogram 08/04/20 EF 55-60, no RWMA, GLS -18.5, normal RVSF, mild LAE, trivial MR  Carotid US 08/04/20 R 1-39, L 40-59  History of Present Illness:    Brian Mccullough was admitted 08/28/2010/1 with an Inferior STEMI.  Emergent cardiac catheterization demonstrated a thrombotic occlusion of the RCA and severe multivessel disease with severe stenosis of the distal LM, ostial LAD, proximal LAD, RI, proximal LCx.  His RCA was treated with balloon angioplasty and he was placed on IABP.  He was seen by cardiac surgery and underwent CABG x 6.  His EF is preserved.  His post op course was notable for SVT/atrial fibrillation  which was treated with Amiodarone and increased doses of beta-blocker.  He returns for follow up.    He is here alone.  Since discharge, he has been doing well.  He still has leg swelling (R>L).  It is improving.  He has not had orthopnea, significant shortness of breath.  He no longer has chest soreness.  He has not had syncope or near syncope.  He has already seen Dr. Orvan Seen and has been released.  Past Medical History:   Diagnosis Date  . Allergy   . CAD (coronary artery disease)    s/p Inf STEMI 10/21>>POBA to RCA >> s/p CABG (L-LAD, S-PDA, L Radial-OM1/RI, RIMA-RPLA) // post op AF; SVT >> Amiod Rx // Echo 10/21: EF 55-60, no RWMA, GLS -18.5, normal RVSF, mild LAE, trivial MR   . Carotid artery disease ([place])    [place] 10/21: R 1-39; L 40-59  . Hyperactive gag reflex   . Hypertension    under control with meds., has been on med. x 30 yr.  . Inguinal hernia 01/2014   bilateral  . LBP (low back pain)   . Non-insulin dependent type 2 diabetes mellitus ([place])   . OA (osteoarthritis) of knee 01/2004   left  . Palpitations   . s/p Inf STEMI 07/2020   POBA to RCA >> CABG  . Vitamin B 12 deficiency     Current Medications: Current Meds  Medication Sig  . acetaminophen (TYLENOL) 500 MG tablet Take 2 tablets (1,000 mg total) by mouth every 6 (six) hours as needed.  Marland Kitchen amiodarone (PACERONE) 200 MG tablet Take 2 tablets (400 mg total) by mouth daily.  Marland Kitchen aspirin 81 MG EC tablet Take 81 mg by mouth daily.    Marland Kitchen atorvastatin (LIPITOR) 80 MG tablet Take 1 tablet (80 mg total) by mouth daily.  . benazepril (LOTENSIN) 20  MG tablet Take 1 tablet (20 mg total) by mouth daily.  . Cholecalciferol (VITAMIN D3) 1000 UNITS tablet Take 1,000 Units by mouth daily.   . clopidogrel (PLAVIX) 75 MG tablet Take 1 tablet (75 mg total) by mouth daily.  . cyanocobalamin 100 MCG tablet Take 1,000 mcg by mouth daily.   . isosorbide dinitrate (ISORDIL) 10 MG tablet Take 1 tablet (10 mg total) by mouth 3 (three) times daily.  Marland Kitchen loratadine (CLARITIN) 10 MG tablet Take 10 mg by mouth daily as needed for allergies or rhinitis.  . meloxicam (MOBIC) 15 MG tablet Take 1 tablet (15 mg total) by mouth daily as needed. for pain  . metFORMIN (GLUCOPHAGE) 500 MG tablet Take 1 tablet (500 mg total) by mouth daily with breakfast.  . metoprolol tartrate (LOPRESSOR) 25 MG tablet Take 1 tablet (25 mg total) by mouth 2 (two) times daily.  . potassium  chloride SA (KLOR-CON) 20 MEQ tablet Take 1 tablet (20 mEq total) by mouth daily.  Marland Kitchen tiZANidine (ZANAFLEX) 4 MG tablet TAKE 1 TABLET BY MOUTH  EVERY 8 HOURS AS NEEDED FOR MUSCLE SPASM(S)  . traMADol (ULTRAM) 50 MG tablet TAKE 1 TO 2 TABLETS BY MOUTH TWICE DAILY AS NEEDED  . traZODone (DESYREL) 50 MG tablet TAKE 1/2 TO 1 TABLETS BY  MOUTH AT BEDTIME AS NEEDED  FOR SLEEP.  Marland Kitchen triamcinolone ointment (KENALOG) 0.1 % Apply 1 application topically 2 (two) times daily as needed (rash).  . [DISCONTINUED] amiodarone (PACERONE) 200 MG tablet Take 2 tablets (400 mg total) by mouth 2 (two) times daily. X 7 days, then decrease to 200 mg BID x 7 days, then decrease to 200 mg daily  . [DISCONTINUED] metoprolol tartrate (LOPRESSOR) 50 MG tablet Take 1 tablet (50 mg total) by mouth 2 (two) times daily.     Allergies:   Codeine   Social History   Tobacco Use  . Smoking status: Current Every Day Smoker    Years: 22.00    Types: Cigars  . Smokeless tobacco: Never Used  . Tobacco comment: 2-3 small cigars/day  Vaping Use  . Vaping Use: Never used  Substance Use Topics  . Alcohol use: No  . Drug use: No     Family Hx: The patient's family history includes Diabetes in his father and mother; Hypertension in his mother. There is no history of Colon cancer, Esophageal cancer, Rectal cancer, or Stomach cancer.  ROS   EKGs/Labs/Other Test Reviewed:    EKG:  EKG is   ordered today.  The ekg ordered today demonstrates sinus bradycardia, HR 46, inferior Q waves, T wave inversions 2, 3, aVF, V3-V5, QTC 442  Recent Labs: 08/04/2020: ALT 13; B Natriuretic Peptide 529.0; TSH 0.673 08/05/2020: Magnesium 2.1 08/07/2020: Hemoglobin 10.6; Platelets 122 08/08/2020: BUN 12; Creatinine, Ser 0.85; Potassium 3.6; Sodium 140   Recent Lipid Panel Lab Results  Component Value Date/Time   CHOL 171 08/04/2020 04:19 AM   TRIG 68 08/04/2020 04:19 AM   HDL 37 (L) 08/04/2020 04:19 AM   CHOLHDL 4.6 08/04/2020 04:19 AM    LDLCALC 120 (H) 08/04/2020 04:19 AM   LDLDIRECT 88.1 01/23/2011 08:29 AM      Risk Assessment/Calculations:      Physical Exam:    VS:  BP 130/70   Pulse (!) 46   Ht 6\' 4"  (1.93 m)   Wt 225 lb (102.1 kg)   SpO2 97%   BMI 27.39 kg/m     Wt Readings from Last 3 Encounters:  08/24/20 225 lb (102.1 kg)  08/17/20 229 lb (103.9 kg)  08/17/20 229 lb 3.2 oz (104 kg)     Constitutional:      Appearance: Healthy appearance. Not in distress.  Neck:     Vascular: JVD normal.  Pulmonary:     Effort: Pulmonary effort is normal.     Breath sounds: No wheezing. No rales.  Cardiovascular:     Normal rate. Regular rhythm. Normal S1. Normal S2.     Murmurs: There is no murmur.     No rub.  Edema:    Pretibial: trace edema of the left pretibial area and 1+ edema of the right pretibial area. Abdominal:     Palpations: Abdomen is soft.  Skin:    General: Skin is warm and dry.  Neurological:     General: No focal deficit present.     Mental Status: Alert and oriented to person, place and time.     Cranial Nerves: Cranial nerves are intact.       ASSESSMENT & PLAN:    1. ST elevation myocardial infarction involving right coronary artery [facility]) He is status post CABG.  He is progressing well.  He is questioning when he could resume driving.  I have reached out to Dr. Orvan Seen' office and asked them to touch base with the patient to let them know when he can drive.  I reminded the patient that he can continue on isosorbide dinitrate for a full 30 days postop and then discontinue.  We also discussed the importance of dual antiplatelet therapy for 1 year post MI.  Continue aspirin, clopidogrel, atorvastatin, metoprolol.  I have encouraged him to pursue cardiac rehabilitation.  Follow-up with me or Dr. Burt Knack in 6 to 8 weeks.  2. Essential hypertension Blood pressure somewhat borderline.  I will reduce his metoprolol as outlined below.  Have asked him to continue to monitor his blood pressure  and let me know if it is above target (<130/80).  3. Mixed hyperlipidemia Continue high intensity statin therapy.  Arrange fasting CMET, lipids in 6 to 8 weeks.  4. Bilateral carotid artery stenosis Mild to moderate bilateral carotid plaque on pre-CABG Dopplers.  Arrange follow-up carotid US in 1 year.  5. Bradycardia He is asymptomatic.  This is likely related to amiodarone and metoprolol tartrate.  I will reduce his metoprolol tartrate to 25 mg twice daily.  Arrange nurse visit in 2 weeks to recheck an EKG.  6. PAF (paroxysmal atrial fibrillation) (HCC) Maintaining sinus rhythm.  He is to reduce his amiodarone to 200 mg daily now.  He will follow-up in 6 to 8 weeks.  We can likely plan on discontinuing his amiodarone after that visit.  7.  Edema He has residual leg swelling from his surgery.  I have encouraged him to keep his legs elevated.  Have given him a prescription for furosemide 20 mg to take once daily as needed for excess swelling.    Dispo:  Return in about 8 weeks (around 10/19/2020) for Routine Follow Up, w/ Dr. Burt Knack, or Richardson Dopp, PA-C, in person.   Medication Adjustments/Labs and Tests Ordered: Current medicines are reviewed at length with the patient today.  Concerns regarding medicines are outlined above.  Tests Ordered: Orders Placed This Encounter  Procedures  . Comprehensive metabolic panel  . Lipid panel  . EKG 12-Lead   Medication Changes: Meds ordered this encounter  Medications  . furosemide (LASIX) 20 MG tablet    Sig: Take 1 tablet (20  mg total) by mouth as needed for fluid or edema.    Dispense:  30 tablet    Refill:  3  . metoprolol tartrate (LOPRESSOR) 25 MG tablet    Sig: Take 1 tablet (25 mg total) by mouth 2 (two) times daily.    Dispense:  180 tablet    Refill:  3  . amiodarone (PACERONE) 200 MG tablet    Sig: Take 2 tablets (400 mg total) by mouth daily.    Dispense:  90 tablet    Refill:  3    Signed, Richardson Dopp, PA-C   08/24/2020 4:50 PM    [facility] [address], [place], [place]  [address] Phone: [phone]; Fax: [phone]

## 2020-08-24 ENCOUNTER — Ambulatory Visit: Payer: Medicare Other | Admitting: Physician Assistant

## 2020-08-24 ENCOUNTER — Encounter: Payer: Self-pay | Admitting: Physician Assistant

## 2020-08-24 ENCOUNTER — Other Ambulatory Visit: Payer: Self-pay

## 2020-08-24 VITALS — BP 130/70 | HR 46 | Ht 76.0 in | Wt 225.0 lb

## 2020-08-24 DIAGNOSIS — I10 Essential (primary) hypertension: Secondary | ICD-10-CM

## 2020-08-24 DIAGNOSIS — R001 Bradycardia, unspecified: Secondary | ICD-10-CM | POA: Diagnosis not present

## 2020-08-24 DIAGNOSIS — I480 Paroxysmal atrial fibrillation: Secondary | ICD-10-CM

## 2020-08-24 DIAGNOSIS — E782 Mixed hyperlipidemia: Secondary | ICD-10-CM

## 2020-08-24 DIAGNOSIS — I6523 Occlusion and stenosis of bilateral carotid arteries: Secondary | ICD-10-CM | POA: Diagnosis not present

## 2020-08-24 DIAGNOSIS — E119 Type 2 diabetes mellitus without complications: Secondary | ICD-10-CM

## 2020-08-24 DIAGNOSIS — R600 Localized edema: Secondary | ICD-10-CM

## 2020-08-24 DIAGNOSIS — I2111 ST elevation (STEMI) myocardial infarction involving right coronary artery: Secondary | ICD-10-CM | POA: Diagnosis not present

## 2020-08-24 MED ORDER — FUROSEMIDE 20 MG PO TABS: 20.0000 mg | ORAL_TABLET | ORAL | 3 refills | Status: DC | PRN

## 2020-08-24 MED ORDER — METOPROLOL TARTRATE 25 MG PO TABS
25.0000 mg | ORAL_TABLET | Freq: Two times a day (BID) | ORAL | 3 refills | Status: DC
Start: 2020-08-24 — End: 2020-10-05

## 2020-08-24 MED ORDER — AMIODARONE HCL 200 MG PO TABS
400.0000 mg | ORAL_TABLET | Freq: Every day | ORAL | 3 refills | Status: DC
Start: 2020-08-24 — End: 2020-09-07

## 2020-08-24 NOTE — Patient Instructions (Signed)
Medication Instructions:  Your physician has recommended you make the following change in your medication:   1) Decrease Metoprolol to 25 mg, 1 tablet by mouth twice a day 2) Start Lasix 20 mg, 1 tablet by mouth as needed for swelling 3) Amiodarone should be 200 mg, 1 tablet by mouth once a day  *If you need a refill on your cardiac medications before your next appointment, please call your pharmacy*  Lab Work: Your physician recommends that you return for lab work in: 8 weeks for CMET and Lipids  If you have labs (blood work) drawn today and your tests are completely normal, you will receive your results only by: Marland Kitchen MyChart Message (if you have MyChart) OR . A paper copy in the mail If you have any lab test that is abnormal or we need to change your treatment, we will call you to review the results.  Testing/Procedures: None ordered today  Follow-Up: At [facility], you and your health needs are our priority.  As part of our continuing mission to provide you with exceptional heart care, we have created designated Provider Care Teams.  These Care Teams include your primary Cardiologist (physician) and Advanced Practice Providers (APPs -  Physician Assistants and Nurse Practitioners) who all work together to provide you with the care you need, when you need it.  Your next appointment:   6 week(s)  The format for your next appointment:   In Person  Provider:   You may see Sherren Mocha, MD or Richardson Dopp, PA-C  Other Instructions Call the office if you do not hear from cardiac rehab in the next 2 weeks

## 2020-09-06 ENCOUNTER — Telehealth (HOSPITAL_COMMUNITY): Payer: Self-pay

## 2020-09-06 NOTE — Telephone Encounter (Signed)
Called patient to see if he was interested in participating in the Cardiac Rehab Program. Patient stated yes. Patient will come in for orientation on 10/12/20 @ 10AM and will attend the 11AM exercise class. Went over insurance, patient verbalized understanding.   Tourist information centre manager.

## 2020-09-07 ENCOUNTER — Ambulatory Visit (INDEPENDENT_AMBULATORY_CARE_PROVIDER_SITE_OTHER): Payer: Medicare Other

## 2020-09-07 ENCOUNTER — Other Ambulatory Visit: Payer: Self-pay

## 2020-09-07 VITALS — BP 130/70 | HR 48 | Resp 13 | Wt 225.0 lb

## 2020-09-07 DIAGNOSIS — Z79899 Other long term (current) drug therapy: Secondary | ICD-10-CM

## 2020-09-07 DIAGNOSIS — R001 Bradycardia, unspecified: Secondary | ICD-10-CM | POA: Diagnosis not present

## 2020-09-07 DIAGNOSIS — I2111 ST elevation (STEMI) myocardial infarction involving right coronary artery: Secondary | ICD-10-CM

## 2020-09-07 DIAGNOSIS — I480 Paroxysmal atrial fibrillation: Secondary | ICD-10-CM

## 2020-09-07 MED ORDER — AMIODARONE HCL 200 MG PO TABS: 200.0000 mg | ORAL_TABLET | Freq: Every day | ORAL | 3 refills | Status: DC

## 2020-09-07 NOTE — Progress Notes (Signed)
Nurse visit today done after OV on 08/24/20 with [employer] weaver PA... pt had bradycardia and needed EKG 2 weeks later to follow up after med changes.   Pt is still c/o some light headedness. No presyncope, syncope, no palpitations. No new dyspnea.    EKG.... NSR with rate 48 bpm and BP 130/70.   Pt advised the following recommendations per Scott...and verbalized understanding.    "Make sure he is on Amio 200 mg qd now. Reduce Metoprolol to 12.5 bid x 3 days, then once daily x 3 days, then stop. Get another EKG in 2 weeks Have him keep an eye on his BP and let us know if > 130/80. Once we get him off of Amiodarone in a couple of months, we will likely be able to put him back on Metoprolol."

## 2020-09-07 NOTE — Progress Notes (Signed)
Agree. Richardson Dopp, PA-C    09/07/2020 4:51 PM

## 2020-09-07 NOTE — Patient Instructions (Addendum)
Medication Instructions:  KEEP TAKING YOUR AMIODARONE 200 MG ONLY ONCE A DAY DECREASE YOUR METOPROLOL TO 12.5 MG TWICE A DAY FOR 3 DAYS, THEN ONCE A DAY FOR 3 DAYS, THEN STOP.   *If you need a refill on your cardiac medications before your next appointment, please call your pharmacy*   Lab Work: none If you have labs (blood work) drawn today and your tests are completely normal, you will receive your results only by: Marland Kitchen MyChart Message (if you have MyChart) OR . A paper copy in the mail If you have any lab test that is abnormal or we need to change your treatment, we will call you to review the results.   Testing/Procedures: none   Follow-Up: At [facility], you and your health needs are our priority.  As part of our continuing mission to provide you with exceptional heart care, we have created designated Provider Care Teams.  These Care Teams include your primary Cardiologist (physician) and Advanced Practice Providers (APPs -  Physician Assistants and Nurse Practitioners) who all work together to provide you with the care you need, when you need it.  We recommend signing up for the patient portal called "MyChart".  Sign up information is provided on this After Visit Summary.  MyChart is used to connect with patients for Virtual Visits (Telemedicine).  Patients are able to view lab/test results, encounter notes, upcoming appointments, etc.  Non-urgent messages can be sent to your provider as well.   To learn more about what you can do with MyChart, go to [email or web address].    Your next appointment:   2 week(s)  The format for your next appointment:   In Person  Provider:   Nurse Visit for EKG    Please check your BP and let us know if stays greater than 130/80.  [phone].

## 2020-09-08 ENCOUNTER — Telehealth: Payer: Self-pay | Admitting: *Deleted

## 2020-09-08 NOTE — Telephone Encounter (Signed)
Brian Mccullough called with questions regarding being able to drive and what restrictions he still has. Pt is s/p CABG 08/04/20 by Dr. Orvan Seen who has cleared him post surgery. Pt informed he is able to drive but to restrict any heavy lifting to 10-20lb until 12 weeks post surgery. Pt accepts, all questions answered.

## 2020-09-15 ENCOUNTER — Other Ambulatory Visit: Payer: Self-pay | Admitting: Physician Assistant

## 2020-09-19 ENCOUNTER — Other Ambulatory Visit: Payer: Self-pay | Admitting: Physician Assistant

## 2020-09-21 ENCOUNTER — Ambulatory Visit (INDEPENDENT_AMBULATORY_CARE_PROVIDER_SITE_OTHER): Payer: Medicare Other

## 2020-09-21 ENCOUNTER — Other Ambulatory Visit: Payer: Self-pay

## 2020-09-21 VITALS — BP 140/80 | HR 68 | Ht 76.0 in | Wt 221.2 lb

## 2020-09-21 DIAGNOSIS — R001 Bradycardia, unspecified: Secondary | ICD-10-CM | POA: Diagnosis not present

## 2020-09-21 NOTE — Progress Notes (Signed)
The patient is in today for EKG after stopping metoprolol 2 weeks ago due to bradycardia.  The patient states he originally tapered the metoprolol as directed and stopped. But the next day, his palpitations came back. He has been taking metoprolol 12.5 mg twice daily since then (6 days).  He states his palpitations are now controlled and he isn't sluggish like he was before on metoprolol. Confirmed he is still taking amio 200 mg daily. He completed his course of Imdur and has no complaints of CP. He had a home health nurse check his vitals last week and his BP was 134/80. He does not remember the HR but the nurse said everything was "fine."  Today, his HR is 68-71 and BP 140/80. Reviewed EKG with Dr. Meda Coffee, DOD, who states to keep on same medications (including metoprolol 12.5 mg BID). Will have EKG uploaded for Scott's review. The patient is scheduled with [facility] 12/28.  He understands he will be called if Nicki Reaper has recommendations prior to that appointment.

## 2020-09-21 NOTE — Progress Notes (Signed)
ECG reviewed.  HR improved. Given improved HR and improved clinical symptoms, continue current medications and f/u as planned. Richardson Dopp, PA-C    09/21/2020 1:47 PM

## 2020-09-27 ENCOUNTER — Other Ambulatory Visit: Payer: Self-pay

## 2020-09-27 ENCOUNTER — Ambulatory Visit (INDEPENDENT_AMBULATORY_CARE_PROVIDER_SITE_OTHER): Payer: Medicare Other | Admitting: Internal Medicine

## 2020-09-27 ENCOUNTER — Encounter: Payer: Self-pay | Admitting: Internal Medicine

## 2020-09-27 DIAGNOSIS — M65311 Trigger thumb, right thumb: Secondary | ICD-10-CM

## 2020-09-27 DIAGNOSIS — E119 Type 2 diabetes mellitus without complications: Secondary | ICD-10-CM | POA: Diagnosis not present

## 2020-09-27 DIAGNOSIS — M25622 Stiffness of left elbow, not elsewhere classified: Secondary | ICD-10-CM | POA: Diagnosis not present

## 2020-09-27 DIAGNOSIS — I10 Essential (primary) hypertension: Secondary | ICD-10-CM

## 2020-09-27 DIAGNOSIS — I25110 Atherosclerotic heart disease of native coronary artery with unstable angina pectoris: Secondary | ICD-10-CM | POA: Diagnosis not present

## 2020-09-27 NOTE — Assessment & Plan Note (Signed)
Labs

## 2020-09-27 NOTE — Patient Instructions (Signed)
  Use Voltaren gel on elbow, thumb

## 2020-09-27 NOTE — Progress Notes (Signed)
Subjective:  Patient ID: Brian Mccullough, male    DOB: 1951/02/03  Age: 69 y.o. MRN: [id number]  CC: Follow-up (6 week f/u)   HPI [name] presents for CAD, CABG, dyslipidemia f/u Recovering well. Weaning off Metoprolol C L elbow pain C/o R thumb triggering  Outpatient Medications Prior to Visit  Medication Sig Dispense Refill  . acetaminophen (TYLENOL) 500 MG tablet Take 2 tablets (1,000 mg total) by mouth every 6 (six) hours as needed. 30 tablet 0  . amiodarone (PACERONE) 200 MG tablet Take 1 tablet (200 mg total) by mouth daily. 90 tablet 3  . aspirin 81 MG EC tablet Take 81 mg by mouth daily.    Marland Kitchen atorvastatin (LIPITOR) 80 MG tablet Take 1 tablet (80 mg total) by mouth daily. 30 tablet 3  . benazepril (LOTENSIN) 20 MG tablet Take 1 tablet (20 mg total) by mouth daily. 90 tablet 3  . Cholecalciferol (VITAMIN D3) 1000 UNITS tablet Take 1,000 Units by mouth daily.    . clopidogrel (PLAVIX) 75 MG tablet Take 1 tablet (75 mg total) by mouth daily. 30 tablet 3  . cyanocobalamin 100 MCG tablet Take 1,000 mcg by mouth daily.    . furosemide (LASIX) 20 MG tablet Take 1 tablet (20 mg total) by mouth as needed for fluid or edema. 30 tablet 3  . loratadine (CLARITIN) 10 MG tablet Take 10 mg by mouth daily as needed for allergies or rhinitis.    . meloxicam (MOBIC) 15 MG tablet Take 1 tablet (15 mg total) by mouth daily as needed. for pain 90 tablet 3  . metFORMIN (GLUCOPHAGE) 500 MG tablet Take 1 tablet (500 mg total) by mouth daily with breakfast. 90 tablet 3  . metoprolol tartrate (LOPRESSOR) 25 MG tablet Take 1 tablet (25 mg total) by mouth 2 (two) times daily. (Patient taking differently: Take 12.5 mg by mouth 2 (two) times daily.) 180 tablet 3  . potassium chloride SA (KLOR-CON) 20 MEQ tablet Take 1 tablet (20 mEq total) by mouth daily. 30 tablet 11  . tiZANidine (ZANAFLEX) 4 MG tablet TAKE 1 TABLET BY MOUTH  EVERY 8 HOURS AS NEEDED FOR MUSCLE SPASM(S) 270 tablet 0  . traMADol  (ULTRAM) 50 MG tablet TAKE 1 TO 2 TABLETS BY MOUTH TWICE DAILY AS NEEDED 100 tablet 3  . traZODone (DESYREL) 50 MG tablet TAKE 1/2 TO 1 TABLETS BY  MOUTH AT BEDTIME AS NEEDED  FOR SLEEP. 90 tablet 3  . triamcinolone ointment (KENALOG) 0.1 % Apply 1 application topically 2 (two) times daily as needed (rash). 80 g 0  . isosorbide dinitrate (ISORDIL) 10 MG tablet Take 1 tablet (10 mg total) by mouth 3 (three) times daily. (Patient not taking: No sig reported) 90 tablet 0   No facility-administered medications prior to visit.    ROS: Review of Systems  Constitutional: Negative for appetite change, fatigue and unexpected weight change.  HENT: Negative for congestion, nosebleeds, sneezing, sore throat and trouble swallowing.   Eyes: Negative for itching and visual disturbance.  Respiratory: Negative for cough.   Cardiovascular: Positive for palpitations. Negative for chest pain and leg swelling.  Gastrointestinal: Negative for abdominal distention, blood in stool, diarrhea and nausea.  Genitourinary: Negative for frequency and hematuria.  Musculoskeletal: Negative for back pain, gait problem, joint swelling and neck pain.  Skin: Negative for rash.  Neurological: Negative for dizziness, tremors, speech difficulty and weakness.  Psychiatric/Behavioral: Negative for agitation, dysphoric mood and sleep disturbance. The patient is not nervous/anxious.  Objective:  BP 122/70 (BP Location: Left Arm)   Pulse (!) 57   Temp 99.2 F (37.3 C) (Oral)   Wt 222 lb 12.8 oz (101.1 kg)   SpO2 96%   BMI 27.12 kg/m   BP Readings from Last 3 Encounters:  09/27/20 122/70  09/21/20 140/80  09/07/20 130/70    Wt Readings from Last 3 Encounters:  09/27/20 222 lb 12.8 oz (101.1 kg)  09/21/20 221 lb 3.2 oz (100.3 kg)  09/07/20 225 lb (102.1 kg)    Physical Exam Constitutional:      General: He is not in acute distress.    Appearance: He is well-developed.     Comments: NAD  HENT:      Mouth/Throat:     Mouth: Oropharynx is clear and moist.  Eyes:     Conjunctiva/sclera: Conjunctivae normal.     Pupils: Pupils are equal, round, and reactive to light.  Neck:     Thyroid: No thyromegaly.     Vascular: No JVD.  Cardiovascular:     Rate and Rhythm: Normal rate and regular rhythm.     Pulses: Intact distal pulses.     Heart sounds: Normal heart sounds. No murmur heard. No friction rub. No gallop.   Pulmonary:     Effort: Pulmonary effort is normal. No respiratory distress.     Breath sounds: Normal breath sounds. No wheezing or rales.  Chest:     Chest wall: No tenderness.  Abdominal:     General: Bowel sounds are normal. There is no distension.     Palpations: Abdomen is soft. There is no mass.     Tenderness: There is no abdominal tenderness. There is no guarding or rebound.  Musculoskeletal:        General: No tenderness or edema. Normal range of motion.     Cervical back: Normal range of motion.  Lymphadenopathy:     Cervical: No cervical adenopathy.  Skin:    General: Skin is warm and dry.     Findings: No rash.  Neurological:     Mental Status: He is alert and oriented to person, place, and time.     Cranial Nerves: No cranial nerve deficit.     Motor: No abnormal muscle tone.     Coordination: He displays a negative Romberg sign. Coordination normal.     Gait: Gait normal.     Deep Tendon Reflexes: Reflexes are normal and symmetric.  Psychiatric:        Mood and Affect: Mood and affect normal.        Behavior: Behavior normal.        Thought Content: Thought content normal.        Judgment: Judgment normal.   L elbow w/lat swelling, can't extend all the way   Lab Results  Component Value Date   WBC 10.0 08/07/2020   HGB 10.6 (L) 08/07/2020   HCT 31.2 (L) 08/07/2020   PLT 122 (L) 08/07/2020   GLUCOSE 104 (H) 08/08/2020   CHOL 171 08/04/2020   TRIG 68 08/04/2020   HDL 37 (L) 08/04/2020   LDLDIRECT 88.1 01/23/2011   LDLCALC 120 (H) 08/04/2020    ALT 13 08/04/2020   AST 47 (H) 08/04/2020   NA 140 08/08/2020   K 3.6 08/08/2020   CL 110 08/08/2020   CREATININE 0.85 08/08/2020   BUN 12 08/08/2020   CO2 23 08/08/2020   TSH 0.673 08/04/2020   PSA 0.26 07/30/2020   INR 1.5 (H) 08/04/2020  HGBA1C 5.4 08/04/2020   MICROALBUR <0.2 04/26/2020    DG Chest 2 View  Result Date: 08/16/2020 CLINICAL DATA:  Status post coronary artery bypass grafting EXAM: CHEST - 2 VIEW COMPARISON:  August 07, 2020 FINDINGS: Small left pleural effusion noted with apparent atelectatic change in the left base. Lungs elsewhere clear. Heart size and pulmonary vascularity are normal. No adenopathy. Status post coronary artery bypass grafting. No adenopathy. There is degenerative change in the thoracic spine. IMPRESSION: Small left pleural effusion with left base atelectasis. Lungs elsewhere clear. Cardiac silhouette within normal limits. Postoperative changes noted. Electronically Signed   By: Lowella Grip III M.D.   On: 08/16/2020 13:24    Assessment & Plan:    Walker Kehr, MD

## 2020-09-27 NOTE — Assessment & Plan Note (Addendum)
Relapsed Ortho ref Voltaren gel

## 2020-09-27 NOTE — Assessment & Plan Note (Signed)
Cont w/ current meds including benazepril, furosemide Labs at the next visit

## 2020-09-27 NOTE — Assessment & Plan Note (Addendum)
Cont w/ current meds including aspirin, Plavix, atorvastatin, metoprolol, benazepril, furosemide

## 2020-09-27 NOTE — Assessment & Plan Note (Addendum)
New. L elbow w/lat swelling, can't extend all the way - new Ortho ref Voltaren gel

## 2020-10-02 ENCOUNTER — Other Ambulatory Visit: Payer: Self-pay | Admitting: Physician Assistant

## 2020-10-04 ENCOUNTER — Telehealth (HOSPITAL_COMMUNITY): Payer: Self-pay

## 2020-10-04 NOTE — Telephone Encounter (Signed)
Cardiac Rehab Medication Review by a Pharmacist  Does the patient feel that his/her medications are working for him/her?  yes  Has the patient been experiencing any side effects to the medications prescribed?  yes - the patient reports feeling sluggish on occasion and thinks it may be due to one of their new medications  Does the patient measure his/her own blood pressure or blood glucose at home?  no - the patient reports that their last blood pressure reading from their most recent outpatient visit was 122/80  Does the patient have any problems obtaining medications due to transportation or finances?   no  Understanding of regimen: good Understanding of indications: good Potential of compliance: good   Pharmacist Intervention: I counseled the patient on the different side effects of metoprolol tartrate, which may include feeling sluggish, like the patient is reporting. The patient mentioned that they have a cardiologist appointment tomorrow to address this issue. The patient verbalized understanding and stated that if any medication changes are made at there appointment tomorrow, they will let the nurses at the cardiac rehab orientation appointment know.   Shauna Hugh, PharmD, [place]  PGY-1 Pharmacy Resident 10/04/2020 4:12 PM  Please check [email or web address] for unit-specific pharmacy phone numbers.

## 2020-10-04 NOTE — Progress Notes (Signed)
Cardiology Office Note:    Date:  10/05/2020   ID:  Brian Mccullough, DOB 30-Dec-1950, MRN [id number]  PCP:  Cassandria Anger, MD  [facility] Cardiologist:  Sherren Mocha, MD   [place] Electrophysiologist:  None   Referring MD: Sherren Mocha, MD   Chief Complaint:  Follow-up (CAD, bradycardia)    Patient Profile:    Brian Mccullough is a 69 y.o. male with:   Coronary artery disease ? S/p Inf STEMI 10/21 >> PCI: POBA to RCA  S/p CABG 10/21 (L-LAD, S-PDA, L radial-OM1/RI, S-D1, RIMA-RPLA)  Post AFib >> Amiodarone   Carotid artery disease ? [place] 10/21: R 1-39; L 40-59  Diabetes mellitus   Hypertension   Hyperlipidemia   SVT   Prior CV studies: Cardiac catheterization 08-05-2020 LM mid 70 LAD prox 80; D1 80 RI 75 LCx prox 70 RCA prox 95, mid 75; RPDA 90, 100 EF 50-55. Inf HK PCI:  POBA to prox RCA   Echocardiogram 08/04/20 EF 55-60, no RWMA, GLS -18.5, normal RVSF, mild LAE, trivial MR  Carotid US 08/04/20 R 1-39, L 40-59  History of Present Illness:    Brian Mccullough is s/p a STEMI in 10/21 followed by CABG. His post op course was notable for SVT/atrial fibrillation and he was managed with amiodarone and beta-blocker.  He was last seen in the office in 11/21.  His beta-blocker was reduced due to bradycardia.  His amiodarone has also been reduced.  We tried to stop his beta-blocker but he had more palpitations.  his HR was ok when last checked on Metoprolol 12.5 mg twice daily.  He returns for follow up. He is here alone. He has occasional musculoskeletal type chest discomfort. This seems to be improving. He has not had recurrent angina. He has not had shortness of breath, syncope, orthopnea, leg edema. As noted, he had recurrent palpitations. It sounds as though his heart rate is likely going fast. I suspect he is having recurrent SVT. This is much better on low-dose metoprolol tartrate.      Past Medical History:  Diagnosis Date   Allergy     CAD (coronary artery disease)    s/p Inf STEMI 10/21>>POBA to RCA >> s/p CABG (L-LAD, S-PDA, L Radial-OM1/RI, RIMA-RPLA) // post op AF; SVT >> Amiod Rx // Echo 10/21: EF 55-60, no RWMA, GLS -18.5, normal RVSF, mild LAE, trivial MR    Carotid artery disease ([place])    [place] 10/21: R 1-39; L 40-59   Hyperactive gag reflex    Hypertension    under control with meds., has been on med. x 30 yr.   Inguinal hernia 01/2014   bilateral   LBP (low back pain)    Non-insulin dependent type 2 diabetes mellitus (HCC)    OA (osteoarthritis) of knee 01/2004   left   Palpitations    s/p Inf STEMI 07/2020   POBA to RCA >> CABG   Vitamin B 12 deficiency     Current Medications: Current Meds  Medication Sig   amiodarone (PACERONE) 200 MG tablet Take 1 tablet (200 mg total) by mouth daily.   aspirin 81 MG EC tablet Take 81 mg by mouth daily.   atorvastatin (LIPITOR) 80 MG tablet Take 1 tablet (80 mg total) by mouth daily.   benazepril (LOTENSIN) 20 MG tablet Take 1 tablet (20 mg total) by mouth daily.   Cholecalciferol (VITAMIN D3) 1000 UNITS tablet Take 1,000 Units by mouth daily.   clopidogrel (PLAVIX) 75 MG  tablet Take 1 tablet (75 mg total) by mouth daily.   cyanocobalamin 100 MCG tablet Take 1,000 mcg by mouth daily.   furosemide (LASIX) 20 MG tablet Take 1 tablet (20 mg total) by mouth as needed for fluid or edema.   loratadine (CLARITIN) 10 MG tablet Take 10 mg by mouth daily as needed for allergies or rhinitis.   meloxicam (MOBIC) 15 MG tablet Take 1 tablet (15 mg total) by mouth daily as needed. for pain   metFORMIN (GLUCOPHAGE) 500 MG tablet Take 1 tablet (500 mg total) by mouth daily with breakfast.   potassium chloride SA (KLOR-CON) 20 MEQ tablet Take 1 tablet (20 mEq total) by mouth daily.   tiZANidine (ZANAFLEX) 4 MG tablet TAKE 1 TABLET BY MOUTH  EVERY 8 HOURS AS NEEDED FOR MUSCLE SPASM(S)   traMADol (ULTRAM) 50 MG tablet TAKE 1 TO 2 TABLETS BY MOUTH TWICE DAILY AS  NEEDED   traZODone (DESYREL) 50 MG tablet TAKE 1/2 TO 1 TABLETS BY  MOUTH AT BEDTIME AS NEEDED  FOR SLEEP.   triamcinolone ointment (KENALOG) 0.1 % Apply 1 application topically 2 (two) times daily as needed (rash).   [DISCONTINUED] metoprolol tartrate (LOPRESSOR) 25 MG tablet Take 1 tablet (25 mg total) by mouth 2 (two) times daily.     Allergies:   Codeine   Social History   Tobacco Use   Smoking status: Current Every Day Smoker    Years: 22.00    Types: Cigars   Smokeless tobacco: Never Used   Tobacco comment: 2-3 small cigars/day  Vaping Use   Vaping Use: Never used  Substance Use Topics   Alcohol use: No   Drug use: No     Family Hx: The patient's family history includes Diabetes in his father and mother; Hypertension in his mother. There is no history of Colon cancer, Esophageal cancer, Rectal cancer, or Stomach cancer.  ROS   EKGs/Labs/Other Test Reviewed:    EKG:  EKG is not   ordered today.  The ekg ordered today demonstrates n/a  Recent Labs: 08/04/2020: ALT 13; B Natriuretic Peptide 529.0; TSH 0.673 08/05/2020: Magnesium 2.1 08/07/2020: Hemoglobin 10.6; Platelets 122 08/08/2020: BUN 12; Creatinine, Ser 0.85; Potassium 3.6; Sodium 140   Recent Lipid Panel Lab Results  Component Value Date/Time   CHOL 171 08/04/2020 04:19 AM   TRIG 68 08/04/2020 04:19 AM   HDL 37 (L) 08/04/2020 04:19 AM   CHOLHDL 4.6 08/04/2020 04:19 AM   LDLCALC 120 (H) 08/04/2020 04:19 AM   LDLDIRECT 88.1 01/23/2011 08:29 AM      Risk Assessment/Calculations:     CHA2DS2-VASc Score = 4  This indicates a 4.8% annual risk of stroke. The patient's score is based upon: CHF History: No HTN History: Yes Diabetes History: Yes Stroke History: No Vascular Disease History: Yes Age Score: 1 Gender Score: 0     Physical Exam:    VS:  BP (!) 120/50    Pulse (!) 58    Ht 6\' 4"  (1.93 m)    Wt 223 lb (101.2 kg)    SpO2 98%    BMI 27.14 kg/m     Wt Readings from Last 3  Encounters:  10/05/20 223 lb (101.2 kg)  09/27/20 222 lb 12.8 oz (101.1 kg)  09/21/20 221 lb 3.2 oz (100.3 kg)     Constitutional:      Appearance: Healthy appearance. Not in distress.  Neck:     Vascular: No JVR.  Pulmonary:     Effort: Pulmonary  effort is normal.     Breath sounds: No wheezing. No rales.  Cardiovascular:     Normal rate. Regular rhythm. Normal S1. Normal S2.     Murmurs: There is no murmur.  Edema:    Peripheral edema absent.  Abdominal:     Palpations: Abdomen is soft.  Skin:    General: Skin is warm and dry.  Neurological:     General: No focal deficit present.     Mental Status: Alert and oriented to person, place and time.     Cranial Nerves: Cranial nerves are intact.       ASSESSMENT & PLAN:    1. Coronary artery disease involving native coronary artery of native heart without angina pectoris Status post inferior STEMI 10/2021treated with angioplasty of the RCA and followed by CABG. He is overall doing well. He is slated to start cardiac rehabilitation in the next couple of weeks. He does have some musculoskeletal type chest discomfort. However, he has not had recurrent anginal symptoms. Continue aspirin, atorvastatin, clopidogrel, metoprolol tartrate.  2. PAF (paroxysmal atrial fibrillation) ([place]) 3. SVT (supraventricular tachycardia) ([place]) 4. Bradycardia He had postoperative atrial fibrillation managed with amiodarone. He also has a history of SVT as well as bradycardia. With adjustments in his amiodarone and metoprolol, his heart rate has improved and he is no longer symptomatic with bradycardia. He does have occasional palpitations. I suspect he is feeling recurrent SVT. His symptoms are very similar to what he had in the past with SVT. I have asked him to remain on amiodarone for now. I will arrange follow-up in the next 2 months. If he continues to have palpitations, I will have him wear an event monitor prior to stopping his amiodarone.  5.  Essential hypertension The patient's blood pressure is controlled on his current regimen.  Continue current therapy.   6. Mixed hyperlipidemia Continue high intensity statin therapy. He has follow-up lipids and LFTs pending.        Dispo:  Return in about 2 months (around 12/06/2020) for Routine Follow Up, w/ Dr. Burt Knack, or Richardson Dopp, PA-C, in person.   Medication Adjustments/Labs and Tests Ordered: Current medicines are reviewed at length with the patient today.  Concerns regarding medicines are outlined above.  Tests Ordered: No orders of the defined types were placed in this encounter.  Medication Changes: Meds ordered this encounter  Medications   metoprolol tartrate (LOPRESSOR) 25 MG tablet    Sig: Take 0.5 tablets (12.5 mg total) by mouth 2 (two) times daily.    Dispense:  90 tablet    Refill:  3    Signed, Richardson Dopp, PA-C  10/05/2020 9:48 PM    [facility] [address], [place], [place]  [address] Phone: [phone]; Fax: [phone]

## 2020-10-05 ENCOUNTER — Ambulatory Visit: Payer: Medicare Other | Admitting: Physician Assistant

## 2020-10-05 ENCOUNTER — Other Ambulatory Visit: Payer: Self-pay

## 2020-10-05 ENCOUNTER — Encounter: Payer: Self-pay | Admitting: Physician Assistant

## 2020-10-05 VITALS — BP 120/50 | HR 58 | Ht 76.0 in | Wt 223.0 lb

## 2020-10-05 DIAGNOSIS — I2510 Atherosclerotic heart disease of native coronary artery without angina pectoris: Secondary | ICD-10-CM | POA: Diagnosis not present

## 2020-10-05 DIAGNOSIS — R001 Bradycardia, unspecified: Secondary | ICD-10-CM

## 2020-10-05 DIAGNOSIS — I480 Paroxysmal atrial fibrillation: Secondary | ICD-10-CM | POA: Diagnosis not present

## 2020-10-05 DIAGNOSIS — I10 Essential (primary) hypertension: Secondary | ICD-10-CM | POA: Diagnosis not present

## 2020-10-05 DIAGNOSIS — E782 Mixed hyperlipidemia: Secondary | ICD-10-CM

## 2020-10-05 DIAGNOSIS — I471 Supraventricular tachycardia: Secondary | ICD-10-CM

## 2020-10-05 MED ORDER — METOPROLOL TARTRATE 25 MG PO TABS: 12.5000 mg | ORAL_TABLET | Freq: Two times a day (BID) | ORAL | 3 refills | Status: DC

## 2020-10-05 NOTE — Patient Instructions (Addendum)
Medication Instructions:  Your physician recommends that you continue on your current medications as directed. Please refer to the Current Medication list given to you today.  *If you need a refill on your cardiac medications before your next appointment, please call your pharmacy*  Lab Work: None ordered today  Testing/Procedures: None ordered today  Follow-Up: At [facility], you and your health needs are our priority.  As part of our continuing mission to provide you with exceptional heart care, we have created designated Provider Care Teams.  These Care Teams include your primary Cardiologist (physician) and Advanced Practice Providers (APPs -  Physician Assistants and Nurse Practitioners) who all work together to provide you with the care you need, when you need it.  Your next appointment:   2 month(s) on 12/15/20 at 10:15AM  The format for your next appointment:   In Person  Provider:   Tereso Newcomer, PA-C

## 2020-10-06 ENCOUNTER — Encounter (HOSPITAL_COMMUNITY): Payer: Self-pay | Admitting: *Deleted

## 2020-10-06 NOTE — Progress Notes (Signed)
Pt called to completed health history and given reminder for orientation for PR appointment.We discussed mask, direction to the department, proper shoes,Covid symptoms and our phone number. He voices understanding.

## 2020-10-12 ENCOUNTER — Other Ambulatory Visit: Payer: Self-pay

## 2020-10-12 ENCOUNTER — Encounter (HOSPITAL_COMMUNITY)
Admission: RE | Admit: 2020-10-12 | Discharge: 2020-10-12 | Disposition: A | Discharge status: Home / Self Care | Payer: Medicare Other | Source: Ambulatory Visit | Attending: Cardiovascular Disease | Admitting: Cardiovascular Disease

## 2020-10-12 VITALS — BP 122/72 | HR 63 | Ht 75.5 in | Wt 224.6 lb

## 2020-10-12 DIAGNOSIS — I2111 ST elevation (STEMI) myocardial infarction involving right coronary artery: Secondary | ICD-10-CM | POA: Insufficient documentation

## 2020-10-12 DIAGNOSIS — I213 ST elevation (STEMI) myocardial infarction of unspecified site: Secondary | ICD-10-CM

## 2020-10-12 DIAGNOSIS — Z9861 Coronary angioplasty status: Secondary | ICD-10-CM | POA: Insufficient documentation

## 2020-10-12 DIAGNOSIS — Z951 Presence of aortocoronary bypass graft: Secondary | ICD-10-CM | POA: Insufficient documentation

## 2020-10-13 ENCOUNTER — Encounter (HOSPITAL_COMMUNITY): Payer: Self-pay

## 2020-10-13 NOTE — Progress Notes (Signed)
Cardiac Individual Treatment Plan  Patient Details  Name: Brian Mccullough MRN: [id number] Date of Birth: 1951/01/25 Referring Provider:   Flowsheet Row CARDIAC REHAB PHASE II ORIENTATION from 10/12/2020 in [facility]  Referring Provider Sherren Mocha, MD      Initial Encounter Date:  [place] PHASE II ORIENTATION from 10/12/2020 in [facility]  Date 10/12/20      Visit Diagnosis: 08/04/20 CABG x6  08/03/20 STEMI  08/03/20 PTCA RCA  Patient's Home Medications on Admission:  Current Outpatient Medications:  .  amiodarone (PACERONE) 200 MG tablet, Take 1 tablet (200 mg total) by mouth daily., Disp: 90 tablet, Rfl: 3 .  aspirin 81 MG EC tablet, Take 81 mg by mouth daily., Disp: , Rfl:  .  atorvastatin (LIPITOR) 80 MG tablet, Take 1 tablet (80 mg total) by mouth daily., Disp: 30 tablet, Rfl: 3 .  benazepril (LOTENSIN) 20 MG tablet, Take 1 tablet (20 mg total) by mouth daily., Disp: 90 tablet, Rfl: 3 .  Cholecalciferol (VITAMIN D3) 1000 UNITS tablet, Take 1,000 Units by mouth daily., Disp: , Rfl:  .  clopidogrel (PLAVIX) 75 MG tablet, Take 1 tablet (75 mg total) by mouth daily., Disp: 30 tablet, Rfl: 3 .  cyanocobalamin 100 MCG tablet, Take 1,000 mcg by mouth daily., Disp: , Rfl:  .  furosemide (LASIX) 20 MG tablet, Take 1 tablet (20 mg total) by mouth as needed for fluid or edema., Disp: 30 tablet, Rfl: 3 .  loratadine (CLARITIN) 10 MG tablet, Take 10 mg by mouth daily as needed for allergies or rhinitis., Disp: , Rfl:  .  meloxicam (MOBIC) 15 MG tablet, Take 1 tablet (15 mg total) by mouth daily as needed. for pain, Disp: 90 tablet, Rfl: 3 .  metFORMIN (GLUCOPHAGE) 500 MG tablet, Take 1 tablet (500 mg total) by mouth daily with breakfast., Disp: 90 tablet, Rfl: 3 .  metoprolol tartrate (LOPRESSOR) 25 MG tablet, Take 0.5 tablets (12.5 mg total) by mouth 2 (two) times daily., Disp: 90 tablet, Rfl: 3 .   potassium chloride SA (KLOR-CON) 20 MEQ tablet, Take 1 tablet (20 mEq total) by mouth daily., Disp: 30 tablet, Rfl: 11 .  tiZANidine (ZANAFLEX) 4 MG tablet, TAKE 1 TABLET BY MOUTH  EVERY 8 HOURS AS NEEDED FOR MUSCLE SPASM(S), Disp: 270 tablet, Rfl: 0 .  traMADol (ULTRAM) 50 MG tablet, TAKE 1 TO 2 TABLETS BY MOUTH TWICE DAILY AS NEEDED, Disp: 100 tablet, Rfl: 3 .  traZODone (DESYREL) 50 MG tablet, TAKE 1/2 TO 1 TABLETS BY  MOUTH AT BEDTIME AS NEEDED  FOR SLEEP., Disp: 90 tablet, Rfl: 3 .  triamcinolone ointment (KENALOG) 0.1 %, Apply 1 application topically 2 (two) times daily as needed (rash)., Disp: 80 g, Rfl: 0  Past Medical History: Past Medical History:  Diagnosis Date  . Allergy   . CAD (coronary artery disease)    s/p Inf STEMI 10/21>>POBA to RCA >> s/p CABG (L-LAD, S-PDA, L Radial-OM1/RI, RIMA-RPLA) // post op AF; SVT >> Amiod Rx // Echo 10/21: EF 55-60, no RWMA, GLS -18.5, normal RVSF, mild LAE, trivial MR   . Carotid artery disease ([place])    [place] 10/21: R 1-39; L 40-59  . Hyperactive gag reflex   . Hypertension    under control with meds., has been on med. x 30 yr.  . Inguinal hernia 01/2014   bilateral  . LBP (low back pain)   . Non-insulin dependent type 2  diabetes mellitus ([place])   . OA (osteoarthritis) of knee 01/2004   left  . Palpitations   . s/p Inf STEMI 07/2020   POBA to RCA >> CABG  . Vitamin B 12 deficiency     Tobacco Use: Social History   Tobacco Use  Smoking Status Former Smoker  . Years: 22.00  . Types: Cigars  . Quit date: 08/03/2020  . Years since quitting: 0.1  Smokeless Tobacco Never Used  Tobacco Comment   2-3 small cigars/day    Labs: Recent Review Flowsheet Data    Labs for ITP Cardiac and Pulmonary Rehab Latest Ref Rng & Units 08/04/2020 08/04/2020 08/04/2020 08/04/2020 08/04/2020   Cholestrol 0 - 200 mg/dL - - - - -   LDLCALC 0 - 99 mg/dL - - - - -   LDLDIRECT mg/dL - - - - -   HDL >40 mg/dL - - - - -   Trlycerides <150 mg/dL - - - - -    Hemoglobin A1c 4.8 - 5.6 % - - - - -   PHART 7.350 - 7.450 7.360 7.341(L) 7.345(L) 7.307(L) 7.323(L)   PCO2ART 32.0 - 48.0 mmHg 39.1 41.2 38.0 41.3 41.1   HCO3 20.0 - 28.0 mmol/L 22.1 22.6 20.8 20.6 21.3   TCO2 22 - 32 mmol/L 23 24 22 22 22    ACIDBASEDEF 0.0 - 2.0 mmol/L 3.0(H) 3.0(H) 5.0(H) 5.0(H) 4.0(H)   O2SAT % 99.0 99.0 99.0 99.0 99.0      Capillary Blood Glucose: Lab Results  Component Value Date   GLUCAP 91 08/09/2020   GLUCAP 165 (H) 08/08/2020   GLUCAP 105 (H) 08/08/2020   GLUCAP 95 08/08/2020   GLUCAP 107 (H) 08/08/2020     Exercise Target Goals: Exercise Program Goal: Individual exercise prescription set using results from initial 6 min walk test and THRR while considering  patient's activity barriers and safety.   Exercise Prescription Goal: Starting with aerobic activity 30 plus minutes a day, 3 days per week for initial exercise prescription. Provide home exercise prescription and guidelines that participant acknowledges understanding prior to discharge.  Activity Barriers & Risk Stratification:  Activity Barriers & Cardiac Risk Stratification - 10/12/20 1034      Activity Barriers & Cardiac Risk Stratification   Activity Barriers Left Knee Replacement    Cardiac Risk Stratification High           6 Minute Walk:  6 Minute Walk    Row Name 10/12/20 1047         6 Minute Walk   Phase Initial     Distance 1626 feet     Walk Time 6 minutes     # of Rest Breaks 0     MPH 3.08     METS 3.49     RPE 9     Perceived Dyspnea  0     VO2 Peak 12.23     Symptoms No     Resting HR 63 bpm     Resting BP 122/72     Resting Oxygen Saturation  100 %     Exercise Oxygen Saturation  during 6 min walk 98 %     Max Ex. HR 74 bpm     Max Ex. BP 142/82     2 Minute Post BP 140/80            Oxygen Initial Assessment:   Oxygen Re-Evaluation:   Oxygen Discharge (Final Oxygen Re-Evaluation):   Initial Exercise Prescription:  Initial Exercise  Prescription - 10/12/20 1300      Date of Initial Exercise RX and Referring Provider   Date 10/12/20    Referring Provider Tonny Bollmanooper, Michael, MD    Expected Discharge Date 12/10/20      Treadmill   MPH 2.8    Grade 0    Minutes 15    METs 3.14      NuStep   Level 3    SPM 85    Minutes 15    METs 3      Prescription Details   Frequency (times per week) 3    Duration Progress to 30 minutes of continuous aerobic without signs/symptoms of physical distress      Intensity   THRR 40-80% of Max Heartrate 61-122    Ratings of Perceived Exertion 11-13    Perceived Dyspnea 0-4      Progression   Progression Continue to progress workloads to maintain intensity without signs/symptoms of physical distress.      Resistance Training   Training Prescription Yes    Weight 3 lbs    Reps 10-15           Perform Capillary Blood Glucose checks as needed.  Exercise Prescription Changes:   Exercise Comments:   Exercise Goals and Review:  Exercise Goals    Row Name 10/12/20 1034             Exercise Goals   Increase Physical Activity Yes       Intervention Provide advice, education, support and counseling about physical activity/exercise needs.;Develop an individualized exercise prescription for aerobic and resistive training based on initial evaluation findings, risk stratification, comorbidities and participant's personal goals.       Expected Outcomes Short Term: Attend rehab on a regular basis to increase amount of physical activity.;Long Term: Exercising regularly at least 3-5 days a week.;Long Term: Add in home exercise to make exercise part of routine and to increase amount of physical activity.       Increase Strength and Stamina Yes       Intervention Provide advice, education, support and counseling about physical activity/exercise needs.;Develop an individualized exercise prescription for aerobic and resistive training based on initial evaluation findings, risk  stratification, comorbidities and participant's personal goals.       Expected Outcomes Short Term: Increase workloads from initial exercise prescription for resistance, speed, and METs.;Short Term: Perform resistance training exercises routinely during rehab and add in resistance training at home;Long Term: Improve cardiorespiratory fitness, muscular endurance and strength as measured by increased METs and functional capacity (6MWT)       Able to understand and use rate of perceived exertion (RPE) scale Yes       Intervention Provide education and explanation on how to use RPE scale       Expected Outcomes Short Term: Able to use RPE daily in rehab to express subjective intensity level;Long Term:  Able to use RPE to guide intensity level when exercising independently       Knowledge and understanding of Target Heart Rate Range (THRR) Yes       Intervention Provide education and explanation of THRR including how the numbers were predicted and where they are located for reference       Expected Outcomes Short Term: Able to state/look up THRR;Long Term: Able to use THRR to govern intensity when exercising independently;Short Term: Able to use daily as guideline for intensity in rehab       Able to check pulse independently Yes  Intervention Provide education and demonstration on how to check pulse in carotid and radial arteries.;Review the importance of being able to check your own pulse for safety during independent exercise       Expected Outcomes Short Term: Able to explain why pulse checking is important during independent exercise;Long Term: Able to check pulse independently and accurately       Understanding of Exercise Prescription Yes       Intervention Provide education, explanation, and written materials on patient's individual exercise prescription       Expected Outcomes Short Term: Able to explain program exercise prescription;Long Term: Able to explain home exercise prescription to  exercise independently              Exercise Goals Re-Evaluation :    Discharge Exercise Prescription (Final Exercise Prescription Changes):   Nutrition:  Target Goals: Understanding of nutrition guidelines, daily intake of sodium 1500mg , cholesterol 200mg , calories 30% from fat and 7% or less from saturated fats, daily to have 5 or more servings of fruits and vegetables.  Biometrics:  Pre Biometrics - 10/12/20 1036      Pre Biometrics   Waist Circumference 38 inches    Hip Circumference 41.5 inches    Waist to Hip Ratio 0.92 %    Triceps Skinfold 12 mm    % Body Fat 25 %    Grip Strength 41.5 kg    Flexibility 19.13 in    Single Leg Stand 5.62 seconds            Nutrition Therapy Plan and Nutrition Goals:   Nutrition Assessments:  MEDIFICTS Score Key:  ?70 Need to make dietary changes   40-70 Heart Healthy Diet  ? 40 Therapeutic Level Cholesterol Diet   Picture Your Plate Scores:  <53 Unhealthy dietary pattern with much room for improvement.  41-50 Dietary pattern unlikely to meet recommendations for good health and room for improvement.  51-60 More healthful dietary pattern, with some room for improvement.   >60 Healthy dietary pattern, although there may be some specific behaviors that could be improved.    Nutrition Goals Re-Evaluation:   Nutrition Goals Discharge (Final Nutrition Goals Re-Evaluation):   Psychosocial: Target Goals: Acknowledge presence or absence of significant depression and/or stress, maximize coping skills, provide positive support system. Participant is able to verbalize types and ability to use techniques and skills needed for reducing stress and depression.  Initial Review & Psychosocial Screening:  Initial Psych Review & Screening - 10/13/20 1132      Initial Review   Current issues with None Identified      Family Dynamics   Good Support System? Yes   Brian Mccullough has his wife for support     Barriers    Psychosocial barriers to participate in program There are no identifiable barriers or psychosocial needs.      Screening Interventions   Interventions Encouraged to exercise           Quality of Life Scores:  Quality of Life - 10/12/20 1349      Quality of Life   Select Quality of Life      Quality of Life Scores   Health/Function Pre 28 %    Socioeconomic Pre 28.29 %    Psych/Spiritual Pre 30 %    Family Pre 28.8 %    GLOBAL Pre 28.59 %          Scores of 19 and below usually indicate a poorer quality of life in these areas.  A difference of  2-3 points is a clinically meaningful difference.  A difference of 2-3 points in the total score of the Quality of Life Index has been associated with significant improvement in overall quality of life, self-image, physical symptoms, and general health in studies assessing change in quality of life.  PHQ-9: Recent Review Flowsheet Data    Depression screen [facility] 2/9 10/12/2020 07/30/2020 12/22/2019 12/20/2018 04/09/2017   Decreased Interest 0 0 0 0 0   Down, Depressed, Hopeless 0 0 0 0 0   PHQ - 2 Score 0 0 0 0 0     Interpretation of Total Score  Total Score Depression Severity:  1-4 = Minimal depression, 5-9 = Mild depression, 10-14 = Moderate depression, 15-19 = Moderately severe depression, 20-27 = Severe depression   Psychosocial Evaluation and Intervention:   Psychosocial Re-Evaluation:   Psychosocial Discharge (Final Psychosocial Re-Evaluation):   Vocational Rehabilitation: Provide vocational rehab assistance to qualifying candidates.   Vocational Rehab Evaluation & Intervention:  Vocational Rehab - 10/13/20 1148      Initial Vocational Rehab Evaluation & Intervention   Assessment shows need for Vocational Rehabilitation No   Brian Mccullough is retired/ disabled and does not need vocational rehab at this time          Education: Education Goals: Education classes will be provided on a weekly basis, covering required topics.  Participant will state understanding/return demonstration of topics presented.  Learning Barriers/Preferences:  Learning Barriers/Preferences - 10/13/20 1134      Learning Barriers/Preferences   Learning Barriers Sight   Reading glasses   Learning Preferences Group Instruction;Verbal Instruction;Skilled Demonstration           Education Topics: Hypertension, Hypertension Reduction -Define heart disease and high blood pressure. Discus how high blood pressure affects the body and ways to reduce high blood pressure.   Exercise and Your Heart -Discuss why it is important to exercise, the FITT principles of exercise, normal and abnormal responses to exercise, and how to exercise safely.   Angina -Discuss definition of angina, causes of angina, treatment of angina, and how to decrease risk of having angina.   Cardiac Medications -Review what the following cardiac medications are used for, how they affect the body, and side effects that may occur when taking the medications.  Medications include Aspirin, Beta blockers, calcium channel blockers, ACE Inhibitors, angiotensin receptor blockers, diuretics, digoxin, and antihyperlipidemics.   Congestive Heart Failure -Discuss the definition of CHF, how to live with CHF, the signs and symptoms of CHF, and how keep track of weight and sodium intake.   Heart Disease and Intimacy -Discus the effect sexual activity has on the heart, how changes occur during intimacy as we age, and safety during sexual activity.   Smoking Cessation / COPD -Discuss different methods to quit smoking, the health benefits of quitting smoking, and the definition of COPD.   Nutrition I: Fats -Discuss the types of cholesterol, what cholesterol does to the heart, and how cholesterol levels can be controlled.   Nutrition II: Labels -Discuss the different components of food labels and how to read food label   Heart Parts/Heart Disease and PAD -Discuss the  anatomy of the heart, the pathway of blood circulation through the heart, and these are affected by heart disease.   Stress I: Signs and Symptoms -Discuss the causes of stress, how stress may lead to anxiety and depression, and ways to limit stress.   Stress II: Relaxation -Discuss different types of relaxation techniques to limit stress.  Warning Signs of Stroke / TIA -Discuss definition of a stroke, what the signs and symptoms are of a stroke, and how to identify when someone is having stroke.   Knowledge Questionnaire Score:  Knowledge Questionnaire Score - 10/12/20 1350      Knowledge Questionnaire Score   Pre Score 22/24           Core Components/Risk Factors/Patient Goals at Admission:  Personal Goals and Risk Factors at Admission - 10/13/20 1149      Core Components/Risk Factors/Patient Goals on Admission    Weight Management Yes;Weight Maintenance;Weight Loss    Intervention Weight Management: Develop a combined nutrition and exercise program designed to reach desired caloric intake, while maintaining appropriate intake of nutrient and fiber, sodium and fats, and appropriate energy expenditure required for the weight goal.;Weight Management: Provide education and appropriate resources to help participant work on and attain dietary goals.;Weight Management/Obesity: Establish reasonable short term and long term weight goals.    Expected Outcomes Short Term: Continue to assess and modify interventions until short term weight is achieved;Long Term: Adherence to nutrition and physical activity/exercise program aimed toward attainment of established weight goal;Weight Loss: Understanding of general recommendations for a balanced deficit meal plan, which promotes 1-2 lb weight loss per week and includes a negative energy balance of [phone] kcal/d;Understanding recommendations for meals to include 15-35% energy as protein, 25-35% energy from fat, 35-60% energy from carbohydrates,  less than 200mg  of dietary cholesterol, 20-35 gm of total fiber daily;Understanding of distribution of calorie intake throughout the day with the consumption of 4-5 meals/snacks    Tobacco Cessation Yes    Number of packs per day Jonovan quit smoking in October    Intervention Assist the participant in steps to quit. Provide individualized education and counseling about committing to Tobacco Cessation, relapse prevention, and pharmacological support that can be provided by physician.    Expected Outcomes Long Term: Complete abstinence from all tobacco products for at least 12 months from quit date.    Diabetes Yes    Intervention Provide education about signs/symptoms and action to take for hypo/hyperglycemia.;Provide education about proper nutrition, including hydration, and aerobic/resistive exercise prescription along with prescribed medications to achieve blood glucose in normal ranges: Fasting glucose 65-99 mg/dL    Expected Outcomes Short Term: Participant verbalizes understanding of the signs/symptoms and immediate care of hyper/hypoglycemia, proper foot care and importance of medication, aerobic/resistive exercise and nutrition plan for blood glucose control.;Long Term: Attainment of HbA1C < 7%.    Hypertension Yes    Intervention Provide education on lifestyle modifcations including regular physical activity/exercise, weight management, moderate sodium restriction and increased consumption of fresh fruit, vegetables, and low fat dairy, alcohol moderation, and smoking cessation.;Monitor prescription use compliance.    Expected Outcomes Short Term: Continued assessment and intervention until BP is < 140/32mm HG in hypertensive participants. < 130/76mm HG in hypertensive participants with diabetes, heart failure or chronic kidney disease.;Long Term: Maintenance of blood pressure at goal levels.    Lipids Yes    Intervention Provide education and support for participant on nutrition &  aerobic/resistive exercise along with prescribed medications to achieve LDL 70mg , HDL >40mg .    Expected Outcomes Short Term: Participant states understanding of desired cholesterol values and is compliant with medications prescribed. Participant is following exercise prescription and nutrition guidelines.;Long Term: Cholesterol controlled with medications as prescribed, with individualized exercise RX and with personalized nutrition plan. Value goals: LDL < 70mg , HDL > 40 mg.  Core Components/Risk Factors/Patient Goals Review:    Core Components/Risk Factors/Patient Goals at Discharge (Final Review):    ITP Comments:  ITP Comments    Row Name 10/12/20 1036           ITP Comments Medical Director- Dr. Fransico Him, MD              Comments: Nai attended orientation on 10/12/20 to review rules and guidelines for program.  Completed 6 minute walk test, Intitial ITP, and exercise prescription.  VSS. Telemetry-Sinus Rhythm downward QRS.  Asymptomatic. Safety measures and social distancing in place per CDC guidelines.Barnet Pall, RN,BSN 10/13/2020 11:54 AM

## 2020-10-18 ENCOUNTER — Other Ambulatory Visit: Payer: Self-pay

## 2020-10-18 ENCOUNTER — Encounter (HOSPITAL_COMMUNITY)
Admission: RE | Admit: 2020-10-18 | Discharge: 2020-10-18 | Disposition: A | Discharge status: Home / Self Care | Payer: Medicare Other | Source: Ambulatory Visit | Attending: Cardiovascular Disease | Admitting: Cardiovascular Disease

## 2020-10-18 DIAGNOSIS — Z9861 Coronary angioplasty status: Secondary | ICD-10-CM

## 2020-10-18 DIAGNOSIS — Z951 Presence of aortocoronary bypass graft: Secondary | ICD-10-CM

## 2020-10-18 DIAGNOSIS — I213 ST elevation (STEMI) myocardial infarction of unspecified site: Secondary | ICD-10-CM

## 2020-10-18 DIAGNOSIS — I2111 ST elevation (STEMI) myocardial infarction involving right coronary artery: Secondary | ICD-10-CM | POA: Diagnosis not present

## 2020-10-18 LAB — GLUCOSE, CAPILLARY
Glucose-Capillary: 85 mg/dL (ref 70–99)
Glucose-Capillary: 75 mg/dL (ref 70–99)

## 2020-10-19 ENCOUNTER — Other Ambulatory Visit: Payer: Medicare Other | Admitting: *Deleted

## 2020-10-19 DIAGNOSIS — I2111 ST elevation (STEMI) myocardial infarction involving right coronary artery: Secondary | ICD-10-CM

## 2020-10-19 DIAGNOSIS — I10 Essential (primary) hypertension: Secondary | ICD-10-CM

## 2020-10-19 DIAGNOSIS — E782 Mixed hyperlipidemia: Secondary | ICD-10-CM

## 2020-10-19 LAB — COMPREHENSIVE METABOLIC PANEL
ALT: 8 IU/L (ref 0–44)
AST: 13 IU/L (ref 0–40)
Albumin/Globulin Ratio: 1.7 (ref 1.2–2.2)
Albumin: 4 g/dL (ref 3.8–4.8)
Alkaline Phosphatase: 74 IU/L (ref 44–121)
BUN/Creatinine Ratio: 12 (ref 10–24)
BUN: 13 mg/dL (ref 8–27)
Bilirubin Total: 0.5 mg/dL (ref 0.0–1.2)
CO2: 20 mmol/L (ref 20–29)
Calcium: 9.1 mg/dL (ref 8.6–10.2)
Chloride: 106 mmol/L (ref 96–106)
Creatinine, Ser: 1.1 mg/dL (ref 0.76–1.27)
GFR calc Af Amer: 79 mL/min/{1.73_m2} (ref >59)
GFR calc non Af Amer: 69 mL/min/{1.73_m2} (ref >59)
Globulin, Total: 2.4 g/dL (ref 1.5–4.5)
Glucose: 90 mg/dL (ref 65–99)
Potassium: 3.9 mmol/L (ref 3.5–5.2)
Sodium: 140 mmol/L (ref 134–144)
Total Protein: 6.4 g/dL (ref 6.0–8.5)

## 2020-10-19 LAB — LIPID PANEL
Chol/HDL Ratio: 2.3 ratio (ref 0.0–5.0)
Cholesterol, Total: 104 mg/dL (ref 100–199)
HDL: 45 mg/dL (ref >39)
LDL Chol Calc (NIH): 45 mg/dL (ref 0–99)
Triglycerides: 65 mg/dL (ref 0–149)
VLDL Cholesterol Cal: 14 mg/dL (ref 5–40)

## 2020-10-19 NOTE — Progress Notes (Signed)
Cardiac Individual Treatment Plan  Patient Details  Name: Brian Mccullough MRN: [id number] Date of Birth: 12/31/1950 Referring Provider:   Flowsheet Row CARDIAC REHAB PHASE II ORIENTATION from 10/12/2020 in [facility]  Referring Provider Sherren Mocha, MD      Initial Encounter Date:  [place] PHASE II ORIENTATION from 10/12/2020 in [facility]  Date 10/12/20      Visit Diagnosis: 08/04/20 CABG x6  08/03/20 STEMI  08/03/20 PTCA RCA  Patient's Home Medications on Admission:  Current Outpatient Medications:  .  amiodarone (PACERONE) 200 MG tablet, Take 1 tablet (200 mg total) by mouth daily., Disp: 90 tablet, Rfl: 3 .  aspirin 81 MG EC tablet, Take 81 mg by mouth daily., Disp: , Rfl:  .  atorvastatin (LIPITOR) 80 MG tablet, Take 1 tablet (80 mg total) by mouth daily., Disp: 30 tablet, Rfl: 3 .  benazepril (LOTENSIN) 20 MG tablet, Take 1 tablet (20 mg total) by mouth daily., Disp: 90 tablet, Rfl: 3 .  Cholecalciferol (VITAMIN D3) 1000 UNITS tablet, Take 1,000 Units by mouth daily., Disp: , Rfl:  .  clopidogrel (PLAVIX) 75 MG tablet, Take 1 tablet (75 mg total) by mouth daily., Disp: 30 tablet, Rfl: 3 .  cyanocobalamin 100 MCG tablet, Take 1,000 mcg by mouth daily., Disp: , Rfl:  .  furosemide (LASIX) 20 MG tablet, Take 1 tablet (20 mg total) by mouth as needed for fluid or edema., Disp: 30 tablet, Rfl: 3 .  loratadine (CLARITIN) 10 MG tablet, Take 10 mg by mouth daily as needed for allergies or rhinitis., Disp: , Rfl:  .  meloxicam (MOBIC) 15 MG tablet, Take 1 tablet (15 mg total) by mouth daily as needed. for pain, Disp: 90 tablet, Rfl: 3 .  metFORMIN (GLUCOPHAGE) 500 MG tablet, Take 1 tablet (500 mg total) by mouth daily with breakfast., Disp: 90 tablet, Rfl: 3 .  metoprolol tartrate (LOPRESSOR) 25 MG tablet, Take 0.5 tablets (12.5 mg total) by mouth 2 (two) times daily., Disp: 90 tablet, Rfl: 3 .   potassium chloride SA (KLOR-CON) 20 MEQ tablet, Take 1 tablet (20 mEq total) by mouth daily., Disp: 30 tablet, Rfl: 11 .  tiZANidine (ZANAFLEX) 4 MG tablet, TAKE 1 TABLET BY MOUTH  EVERY 8 HOURS AS NEEDED FOR MUSCLE SPASM(S), Disp: 270 tablet, Rfl: 0 .  traMADol (ULTRAM) 50 MG tablet, TAKE 1 TO 2 TABLETS BY MOUTH TWICE DAILY AS NEEDED, Disp: 100 tablet, Rfl: 3 .  traZODone (DESYREL) 50 MG tablet, TAKE 1/2 TO 1 TABLETS BY  MOUTH AT BEDTIME AS NEEDED  FOR SLEEP., Disp: 90 tablet, Rfl: 3 .  triamcinolone ointment (KENALOG) 0.1 %, Apply 1 application topically 2 (two) times daily as needed (rash)., Disp: 80 g, Rfl: 0  Past Medical History: Past Medical History:  Diagnosis Date  . Allergy   . CAD (coronary artery disease)    s/p Inf STEMI 10/21>>POBA to RCA >> s/p CABG (L-LAD, S-PDA, L Radial-OM1/RI, RIMA-RPLA) // post op AF; SVT >> Amiod Rx // Echo 10/21: EF 55-60, no RWMA, GLS -18.5, normal RVSF, mild LAE, trivial MR   . Carotid artery disease ([place])    [place] 10/21: R 1-39; L 40-59  . Hyperactive gag reflex   . Hypertension    under control with meds., has been on med. x 30 yr.  . Inguinal hernia 01/2014   bilateral  . LBP (low back pain)   . Non-insulin dependent type 2  diabetes mellitus ([place])   . OA (osteoarthritis) of knee 01/2004   left  . Palpitations   . s/p Inf STEMI 07/2020   POBA to RCA >> CABG  . Vitamin B 12 deficiency     Tobacco Use: Social History   Tobacco Use  Smoking Status Former Smoker  . Years: 22.00  . Types: Cigars  . Quit date: 08/03/2020  . Years since quitting: 0.2  Smokeless Tobacco Never Used  Tobacco Comment   2-3 small cigars/day    Labs: Recent Review Flowsheet Data    Labs for ITP Cardiac and Pulmonary Rehab Latest Ref Rng & Units 08/04/2020 08/04/2020 08/04/2020 08/04/2020 08/04/2020   Cholestrol 0 - 200 mg/dL - - - - -   LDLCALC 0 - 99 mg/dL - - - - -   LDLDIRECT mg/dL - - - - -   HDL >40 mg/dL - - - - -   Trlycerides <150 mg/dL - - - - -    Hemoglobin A1c 4.8 - 5.6 % - - - - -   PHART 7.350 - 7.450 7.360 7.341(L) 7.345(L) 7.307(L) 7.323(L)   PCO2ART 32.0 - 48.0 mmHg 39.1 41.2 38.0 41.3 41.1   HCO3 20.0 - 28.0 mmol/L 22.1 22.6 20.8 20.6 21.3   TCO2 22 - 32 mmol/L 23 24 22 22 22    ACIDBASEDEF 0.0 - 2.0 mmol/L 3.0(H) 3.0(H) 5.0(H) 5.0(H) 4.0(H)   O2SAT % 99.0 99.0 99.0 99.0 99.0      Capillary Blood Glucose: Lab Results  Component Value Date   GLUCAP 75 10/18/2020   GLUCAP 85 10/18/2020   GLUCAP 91 08/09/2020   GLUCAP 165 (H) 08/08/2020   GLUCAP 105 (H) 08/08/2020     Exercise Target Goals: Exercise Program Goal: Individual exercise prescription set using results from initial 6 min walk test and THRR while considering  patient's activity barriers and safety.   Exercise Prescription Goal: Starting with aerobic activity 30 plus minutes a day, 3 days per week for initial exercise prescription. Provide home exercise prescription and guidelines that participant acknowledges understanding prior to discharge.  Activity Barriers & Risk Stratification:  Activity Barriers & Cardiac Risk Stratification - 10/12/20 1034      Activity Barriers & Cardiac Risk Stratification   Activity Barriers Left Knee Replacement    Cardiac Risk Stratification High           6 Minute Walk:  6 Minute Walk    Row Name 10/12/20 1047         6 Minute Walk   Phase Initial     Distance 1626 feet     Walk Time 6 minutes     # of Rest Breaks 0     MPH 3.08     METS 3.49     RPE 9     Perceived Dyspnea  0     VO2 Peak 12.23     Symptoms No     Resting HR 63 bpm     Resting BP 122/72     Resting Oxygen Saturation  100 %     Exercise Oxygen Saturation  during 6 min walk 98 %     Max Ex. HR 74 bpm     Max Ex. BP 142/82     2 Minute Post BP 140/80            Oxygen Initial Assessment:   Oxygen Re-Evaluation:   Oxygen Discharge (Final Oxygen Re-Evaluation):   Initial Exercise Prescription:  Initial Exercise Prescription -  10/12/20 1300      Date of Initial Exercise RX and Referring Provider   Date 10/12/20    Referring Provider Sherren Mocha, MD    Expected Discharge Date 12/10/20      Treadmill   MPH 2.8    Grade 0    Minutes 15    METs 3.14      NuStep   Level 3    SPM 85    Minutes 15    METs 3      Prescription Details   Frequency (times per week) 3    Duration Progress to 30 minutes of continuous aerobic without signs/symptoms of physical distress      Intensity   THRR 40-80% of Max Heartrate 61-122    Ratings of Perceived Exertion 11-13    Perceived Dyspnea 0-4      Progression   Progression Continue to progress workloads to maintain intensity without signs/symptoms of physical distress.      Resistance Training   Training Prescription Yes    Weight 3 lbs    Reps 10-15           Perform Capillary Blood Glucose checks as needed.  Exercise Prescription Changes:  Exercise Prescription Changes    Row Name 10/18/20 1052             Response to Exercise   Blood Pressure (Admit) 128/66       Blood Pressure (Exercise) 148/88       Blood Pressure (Exit) 118/68       Heart Rate (Admit) 70 bpm       Heart Rate (Exercise) 100 bpm       Heart Rate (Exit) 66 bpm       Rating of Perceived Exertion (Exercise) 13       Symptoms none       Comments Off to a good start with exercise.       Duration Continue with 30 min of aerobic exercise without signs/symptoms of physical distress.       Intensity THRR unchanged               Progression   Progression Continue to progress workloads to maintain intensity without signs/symptoms of physical distress.       Average METs 3               Resistance Training   Training Prescription Yes       Weight 3 lbs       Reps 10-15       Time 10 Minutes               Interval Training   Interval Training No               NuStep   Level 3       SPM 85       Minutes 17       METs 2.7               Track   Laps 20        Minutes 15       METs 3.32              Exercise Comments:  Exercise Comments    Row Name 10/18/20 1158           Exercise Comments Patient tolerated 1st exercise session well without symptoms. Switched from treadmill to track per patient preference.  Exercise Goals and Review:  Exercise Goals    Row Name 10/12/20 1034             Exercise Goals   Increase Physical Activity Yes       Intervention Provide advice, education, support and counseling about physical activity/exercise needs.;Develop an individualized exercise prescription for aerobic and resistive training based on initial evaluation findings, risk stratification, comorbidities and participant's personal goals.       Expected Outcomes Short Term: Attend rehab on a regular basis to increase amount of physical activity.;Long Term: Exercising regularly at least 3-5 days a week.;Long Term: Add in home exercise to make exercise part of routine and to increase amount of physical activity.       Increase Strength and Stamina Yes       Intervention Provide advice, education, support and counseling about physical activity/exercise needs.;Develop an individualized exercise prescription for aerobic and resistive training based on initial evaluation findings, risk stratification, comorbidities and participant's personal goals.       Expected Outcomes Short Term: Increase workloads from initial exercise prescription for resistance, speed, and METs.;Short Term: Perform resistance training exercises routinely during rehab and add in resistance training at home;Long Term: Improve cardiorespiratory fitness, muscular endurance and strength as measured by increased METs and functional capacity ( )       Able to understand and use rate of perceived exertion (RPE) scale Yes       Intervention Provide education and explanation on how to use RPE scale       Expected Outcomes Short Term: Able to use RPE daily in rehab to express  subjective intensity level;Long Term:  Able to use RPE to guide intensity level when exercising independently       Knowledge and understanding of Target Heart Rate Range (THRR) Yes       Intervention Provide education and explanation of THRR including how the numbers were predicted and where they are located for reference       Expected Outcomes Short Term: Able to state/look up THRR;Long Term: Able to use THRR to govern intensity when exercising independently;Short Term: Able to use daily as guideline for intensity in rehab       Able to check pulse independently Yes       Intervention Provide education and demonstration on how to check pulse in carotid and radial arteries.;Review the importance of being able to check your own pulse for safety during independent exercise       Expected Outcomes Short Term: Able to explain why pulse checking is important during independent exercise;Long Term: Able to check pulse independently and accurately       Understanding of Exercise Prescription Yes       Intervention Provide education, explanation, and written materials on patient's individual exercise prescription       Expected Outcomes Short Term: Able to explain program exercise prescription;Long Term: Able to explain home exercise prescription to exercise independently              Exercise Goals Re-Evaluation :  Exercise Goals Re-Evaluation    Row Name 10/18/20 1158             Exercise Goal Re-Evaluation   Exercise Goals Review Increase Physical Activity;Able to understand and use rate of perceived exertion (RPE) scale       Comments Patient able to understand and use RPE scale appropriately.       Expected Outcomes Progress workloads as tolerated to help improve cardiorespiratory fitness.  Discharge Exercise Prescription (Final Exercise Prescription Changes):  Exercise Prescription Changes - 10/18/20 1052      Response to Exercise   Blood Pressure (Admit) 128/66     Blood Pressure (Exercise) 148/88    Blood Pressure (Exit) 118/68    Heart Rate (Admit) 70 bpm    Heart Rate (Exercise) 100 bpm    Heart Rate (Exit) 66 bpm    Rating of Perceived Exertion (Exercise) 13    Symptoms none    Comments Off to a good start with exercise.    Duration Continue with 30 min of aerobic exercise without signs/symptoms of physical distress.    Intensity THRR unchanged      Progression   Progression Continue to progress workloads to maintain intensity without signs/symptoms of physical distress.    Average METs 3      Resistance Training   Training Prescription Yes    Weight 3 lbs    Reps 10-15    Time 10 Minutes      Interval Training   Interval Training No      NuStep   Level 3    SPM 85    Minutes 17    METs 2.7      Track   Laps 20    Minutes 15    METs 3.32           Nutrition:  Target Goals: Understanding of nutrition guidelines, daily intake of sodium 1500mg , cholesterol 200mg , calories 30% from fat and 7% or less from saturated fats, daily to have 5 or more servings of fruits and vegetables.  Biometrics:  Pre Biometrics - 10/12/20 1036      Pre Biometrics   Waist Circumference 38 inches    Hip Circumference 41.5 inches    Waist to Hip Ratio 0.92 %    Triceps Skinfold 12 mm    % Body Fat 25 %    Grip Strength 41.5 kg    Flexibility 19.13 in    Single Leg Stand 5.62 seconds            Nutrition Therapy Plan and Nutrition Goals:  Nutrition Therapy & Goals - 10/18/20 1311      Nutrition Therapy   RD appointment deferred Yes           Nutrition Assessments:  MEDIFICTS Score Key:  ?70 Need to make dietary changes   40-70 Heart Healthy Diet  ? 40 Therapeutic Level Cholesterol Diet   Picture Your Plate Scores:  [id number] Unhealthy dietary pattern with much room for improvement.  41-50 Dietary pattern unlikely to meet recommendations for good health and room for improvement.  51-60 More healthful dietary pattern,  with some room for improvement.   >60 Healthy dietary pattern, although there may be some specific behaviors that could be improved.    Nutrition Goals Re-Evaluation:   Nutrition Goals Discharge (Final Nutrition Goals Re-Evaluation):   Psychosocial: Target Goals: Acknowledge presence or absence of significant depression and/or stress, maximize coping skills, provide positive support system. Participant is able to verbalize types and ability to use techniques and skills needed for reducing stress and depression.  Initial Review & Psychosocial Screening:  Initial Psych Review & Screening - 10/13/20 1132      Initial Review   Current issues with None Identified      Family Dynamics   Good Support System? Yes   Jerol has his wife for support     Barriers   Psychosocial barriers to participate in program There  are no identifiable barriers or psychosocial needs.      Screening Interventions   Interventions Encouraged to exercise           Quality of Life Scores:  Quality of Life - 10/12/20 1349      Quality of Life   Select Quality of Life      Quality of Life Scores   Health/Function Pre 28 %    Socioeconomic Pre 28.29 %    Psych/Spiritual Pre 30 %    Family Pre 28.8 %    GLOBAL Pre 28.59 %          Scores of 19 and below usually indicate a poorer quality of life in these areas.  A difference of  2-3 points is a clinically meaningful difference.  A difference of 2-3 points in the total score of the Quality of Life Index has been associated with significant improvement in overall quality of life, self-image, physical symptoms, and general health in studies assessing change in quality of life.  PHQ-9: Recent Review Flowsheet Data    Depression screen [facility] 2/9 10/12/2020 07/30/2020 12/22/2019 12/20/2018 04/09/2017   Decreased Interest 0 0 0 0 0   Down, Depressed, Hopeless 0 0 0 0 0   PHQ - 2 Score 0 0 0 0 0     Interpretation of Total Score  Total Score Depression  Severity:  1-4 = Minimal depression, 5-9 = Mild depression, 10-14 = Moderate depression, 15-19 = Moderately severe depression, 20-27 = Severe depression   Psychosocial Evaluation and Intervention:   Psychosocial Re-Evaluation:  Psychosocial Re-Evaluation    [place] Name 10/19/20 0811             Psychosocial Re-Evaluation   Current issues with None Identified       Interventions Encouraged to attend Cardiac Rehabilitation for the exercise       Continue Psychosocial Services  No Follow up required              Psychosocial Discharge (Final Psychosocial Re-Evaluation):  Psychosocial Re-Evaluation - 10/19/20 0811      Psychosocial Re-Evaluation   Current issues with None Identified    Interventions Encouraged to attend Cardiac Rehabilitation for the exercise    Continue Psychosocial Services  No Follow up required           Vocational Rehabilitation: Provide vocational rehab assistance to qualifying candidates.   Vocational Rehab Evaluation & Intervention:  Vocational Rehab - 10/13/20 1148      Initial Vocational Rehab Evaluation & Intervention   Assessment shows need for Vocational Rehabilitation No   Kennan is retired/ disabled and does not need vocational rehab at this time          Education: Education Goals: Education classes will be provided on a weekly basis, covering required topics. Participant will state understanding/return demonstration of topics presented.  Learning Barriers/Preferences:  Learning Barriers/Preferences - 10/13/20 1134      Learning Barriers/Preferences   Learning Barriers Sight   Reading glasses   Learning Preferences Group Instruction;Verbal Instruction;Skilled Demonstration           Education Topics: Hypertension, Hypertension Reduction -Define heart disease and high blood pressure. Discus how high blood pressure affects the body and ways to reduce high blood pressure.   Exercise and Your Heart -Discuss why it is important  to exercise, the FITT principles of exercise, normal and abnormal responses to exercise, and how to exercise safely.   Angina -Discuss definition of angina, causes of angina, treatment  of angina, and how to decrease risk of having angina.   Cardiac Medications -Review what the following cardiac medications are used for, how they affect the body, and side effects that may occur when taking the medications.  Medications include Aspirin, Beta blockers, calcium channel blockers, ACE Inhibitors, angiotensin receptor blockers, diuretics, digoxin, and antihyperlipidemics.   Congestive Heart Failure -Discuss the definition of CHF, how to live with CHF, the signs and symptoms of CHF, and how keep track of weight and sodium intake.   Heart Disease and Intimacy -Discus the effect sexual activity has on the heart, how changes occur during intimacy as we age, and safety during sexual activity.   Smoking Cessation / COPD -Discuss different methods to quit smoking, the health benefits of quitting smoking, and the definition of COPD.   Nutrition I: Fats -Discuss the types of cholesterol, what cholesterol does to the heart, and how cholesterol levels can be controlled.   Nutrition II: Labels -Discuss the different components of food labels and how to read food label   Heart Parts/Heart Disease and PAD -Discuss the anatomy of the heart, the pathway of blood circulation through the heart, and these are affected by heart disease.   Stress I: Signs and Symptoms -Discuss the causes of stress, how stress may lead to anxiety and depression, and ways to limit stress.   Stress II: Relaxation -Discuss different types of relaxation techniques to limit stress.   Warning Signs of Stroke / TIA -Discuss definition of a stroke, what the signs and symptoms are of a stroke, and how to identify when someone is having stroke.   Knowledge Questionnaire Score:  Knowledge Questionnaire Score - 10/12/20 1350       Knowledge Questionnaire Score   Pre Score 22/24           Core Components/Risk Factors/Patient Goals at Admission:  Personal Goals and Risk Factors at Admission - 10/13/20 1149      Core Components/Risk Factors/Patient Goals on Admission    Weight Management Yes;Weight Maintenance;Weight Loss    Intervention Weight Management: Develop a combined nutrition and exercise program designed to reach desired caloric intake, while maintaining appropriate intake of nutrient and fiber, sodium and fats, and appropriate energy expenditure required for the weight goal.;Weight Management: Provide education and appropriate resources to help participant work on and attain dietary goals.;Weight Management/Obesity: Establish reasonable short term and long term weight goals.    Expected Outcomes Short Term: Continue to assess and modify interventions until short term weight is achieved;Long Term: Adherence to nutrition and physical activity/exercise program aimed toward attainment of established weight goal;Weight Loss: Understanding of general recommendations for a balanced deficit meal plan, which promotes 1-2 lb weight loss per week and includes a negative energy balance of [phone] kcal/d;Understanding recommendations for meals to include 15-35% energy as protein, 25-35% energy from fat, 35-60% energy from carbohydrates, less than 200mg  of dietary cholesterol, 20-35 gm of total fiber daily;Understanding of distribution of calorie intake throughout the day with the consumption of 4-5 meals/snacks    Tobacco Cessation Yes    Number of packs per day [place] quit smoking in October    Intervention Assist the participant in steps to quit. Provide individualized education and counseling about committing to Tobacco Cessation, relapse prevention, and pharmacological support that can be provided by physician.    Expected Outcomes Long Term: Complete abstinence from all tobacco products for at least 12 months from  quit date.    Diabetes Yes    Intervention Provide  education about signs/symptoms and action to take for hypo/hyperglycemia.;Provide education about proper nutrition, including hydration, and aerobic/resistive exercise prescription along with prescribed medications to achieve blood glucose in normal ranges: Fasting glucose 65-99 mg/dL    Expected Outcomes Short Term: Participant verbalizes understanding of the signs/symptoms and immediate care of hyper/hypoglycemia, proper foot care and importance of medication, aerobic/resistive exercise and nutrition plan for blood glucose control.;Long Term: Attainment of HbA1C < 7%.    Hypertension Yes    Intervention Provide education on lifestyle modifcations including regular physical activity/exercise, weight management, moderate sodium restriction and increased consumption of fresh fruit, vegetables, and low fat dairy, alcohol moderation, and smoking cessation.;Monitor prescription use compliance.    Expected Outcomes Short Term: Continued assessment and intervention until BP is < 140/82mm HG in hypertensive participants. < 130/32mm HG in hypertensive participants with diabetes, heart failure or chronic kidney disease.;Long Term: Maintenance of blood pressure at goal levels.    Lipids Yes    Intervention Provide education and support for participant on nutrition & aerobic/resistive exercise along with prescribed medications to achieve LDL 70mg , HDL >40mg .    Expected Outcomes Short Term: Participant states understanding of desired cholesterol values and is compliant with medications prescribed. Participant is following exercise prescription and nutrition guidelines.;Long Term: Cholesterol controlled with medications as prescribed, with individualized exercise RX and with personalized nutrition plan. Value goals: LDL < 70mg , HDL > 40 mg.           Core Components/Risk Factors/Patient Goals Review:   Goals and Risk Factor Review    Row Name 10/19/20 0812              Core Components/Risk Factors/Patient Goals Review   Personal Goals Review Weight Management/Obesity;Lipids;Hypertension;Diabetes;Tobacco Cessation       Review Lissandro started exercise on 10/18/20. Patient did well with exercise. Vital signs stable. CBG WNL. Khalif does not check his CBG's at home       Expected [place] will continue to participate in phase 2 cardiac rehab for exercise nutrition and lifestyle modifications              Core Components/Risk Factors/Patient Goals at Discharge (Final Review):   Goals and Risk Factor Review - 10/19/20 0812      Core Components/Risk Factors/Patient Goals Review   Personal Goals Review Weight Management/Obesity;Lipids;Hypertension;Diabetes;Tobacco Cessation    Review [place] started exercise on 10/18/20. Patient did well with exercise. Vital signs stable. CBG WNL. Sherri does not check his CBG's at home    Expected [place] will continue to participate in phase 2 cardiac rehab for exercise nutrition and lifestyle modifications           ITP Comments:  ITP Comments    Row Name 10/12/20 1036 10/19/20 0809         ITP Comments Medical Director- Dr. Fransico Him, MD 30 Day ITP review. Jadarion started exercise on 10/19/20 and did well. VSS.             Comments: Pt started cardiac rehab on 10/18/20.  Pt tolerated light exercise without difficulty. VSS, telemetry-Sinus Rhythm, asymptomatic.  Medication list reconciled. Pt denies barriers to medicaiton compliance.  PSYCHOSOCIAL ASSESSMENT:  PHQ-0. Pt exhibits positive coping skills, hopeful outlook with supportive family. No psychosocial needs identified at this time, no psychosocial interventions necessary.    Pt enjoys walking.   Pt oriented to exercise equipment and routine.    Understanding verbalized. Ericberto goals are be be healthier and to have confidence to exercise without pain  and to exercise safely.Barnet Pall, RN,BSN 10/19/2020 8:19 AM

## 2020-10-20 ENCOUNTER — Other Ambulatory Visit: Payer: Self-pay

## 2020-10-20 ENCOUNTER — Encounter (HOSPITAL_COMMUNITY)
Admission: RE | Admit: 2020-10-20 | Discharge: 2020-10-20 | Disposition: A | Discharge status: Home / Self Care | Payer: Medicare Other | Source: Ambulatory Visit | Attending: Cardiovascular Disease | Admitting: Cardiovascular Disease

## 2020-10-20 DIAGNOSIS — I213 ST elevation (STEMI) myocardial infarction of unspecified site: Secondary | ICD-10-CM

## 2020-10-20 DIAGNOSIS — Z951 Presence of aortocoronary bypass graft: Secondary | ICD-10-CM | POA: Diagnosis not present

## 2020-10-20 DIAGNOSIS — Z9861 Coronary angioplasty status: Secondary | ICD-10-CM | POA: Diagnosis not present

## 2020-10-20 DIAGNOSIS — I2111 ST elevation (STEMI) myocardial infarction involving right coronary artery: Secondary | ICD-10-CM | POA: Diagnosis not present

## 2020-10-20 LAB — GLUCOSE, CAPILLARY: Glucose-Capillary: 120 mg/dL — ABNORMAL HIGH (ref 70–99)

## 2020-10-22 ENCOUNTER — Other Ambulatory Visit: Payer: Self-pay

## 2020-10-22 ENCOUNTER — Encounter (HOSPITAL_COMMUNITY)
Admission: RE | Admit: 2020-10-22 | Discharge: 2020-10-22 | Disposition: A | Discharge status: Home / Self Care | Payer: Medicare Other | Source: Ambulatory Visit | Attending: Cardiovascular Disease | Admitting: Cardiovascular Disease

## 2020-10-22 DIAGNOSIS — Z9861 Coronary angioplasty status: Secondary | ICD-10-CM

## 2020-10-22 DIAGNOSIS — Z951 Presence of aortocoronary bypass graft: Secondary | ICD-10-CM

## 2020-10-22 DIAGNOSIS — I213 ST elevation (STEMI) myocardial infarction of unspecified site: Secondary | ICD-10-CM

## 2020-10-22 DIAGNOSIS — I2111 ST elevation (STEMI) myocardial infarction involving right coronary artery: Secondary | ICD-10-CM | POA: Diagnosis not present

## 2020-10-22 NOTE — Progress Notes (Signed)
Brian Mccullough 70 y.o. male Nutrition Note  Visit Diagnosis: 08/04/20 CABG x6  08/03/20 STEMI  08/03/20 PTCA RCA  Past Medical History:  Diagnosis Date  . Allergy   . CAD (coronary artery disease)    s/p Inf STEMI 10/21>>POBA to RCA >> s/p CABG (L-LAD, S-PDA, L Radial-OM1/RI, RIMA-RPLA) // post op AF; SVT >> Amiod Rx // Echo 10/21: EF 55-60, no RWMA, GLS -18.5, normal RVSF, mild LAE, trivial MR   . Carotid artery disease ([place])    [place] 10/21: R 1-39; L 40-59  . Hyperactive gag reflex   . Hypertension    under control with meds., has been on med. x 30 yr.  . Inguinal hernia 01/2014   bilateral  . LBP (low back pain)   . Non-insulin dependent type 2 diabetes mellitus ([place])   . OA (osteoarthritis) of knee 01/2004   left  . Palpitations   . s/p Inf STEMI 07/2020   POBA to RCA >> CABG  . Vitamin B 12 deficiency      Medications reviewed.   Current Outpatient Medications:  .  amiodarone (PACERONE) 200 MG tablet, Take 1 tablet (200 mg total) by mouth daily., Disp: 90 tablet, Rfl: 3 .  aspirin 81 MG EC tablet, Take 81 mg by mouth daily., Disp: , Rfl:  .  atorvastatin (LIPITOR) 80 MG tablet, Take 1 tablet (80 mg total) by mouth daily., Disp: 30 tablet, Rfl: 3 .  benazepril (LOTENSIN) 20 MG tablet, Take 1 tablet (20 mg total) by mouth daily., Disp: 90 tablet, Rfl: 3 .  Cholecalciferol (VITAMIN D3) 1000 UNITS tablet, Take 1,000 Units by mouth daily., Disp: , Rfl:  .  clopidogrel (PLAVIX) 75 MG tablet, Take 1 tablet (75 mg total) by mouth daily., Disp: 30 tablet, Rfl: 3 .  cyanocobalamin 100 MCG tablet, Take 1,000 mcg by mouth daily., Disp: , Rfl:  .  furosemide (LASIX) 20 MG tablet, Take 1 tablet (20 mg total) by mouth as needed for fluid or edema., Disp: 30 tablet, Rfl: 3 .  loratadine (CLARITIN) 10 MG tablet, Take 10 mg by mouth daily as needed for allergies or rhinitis., Disp: , Rfl:  .  meloxicam (MOBIC) 15 MG tablet, Take 1 tablet (15 mg total) by mouth daily as needed. for pain,  Disp: 90 tablet, Rfl: 3 .  metFORMIN (GLUCOPHAGE) 500 MG tablet, Take 1 tablet (500 mg total) by mouth daily with breakfast., Disp: 90 tablet, Rfl: 3 .  metoprolol tartrate (LOPRESSOR) 25 MG tablet, Take 0.5 tablets (12.5 mg total) by mouth 2 (two) times daily., Disp: 90 tablet, Rfl: 3 .  potassium chloride SA (KLOR-CON) 20 MEQ tablet, Take 1 tablet (20 mEq total) by mouth daily., Disp: 30 tablet, Rfl: 11 .  tiZANidine (ZANAFLEX) 4 MG tablet, TAKE 1 TABLET BY MOUTH  EVERY 8 HOURS AS NEEDED FOR MUSCLE SPASM(S), Disp: 270 tablet, Rfl: 0 .  traMADol (ULTRAM) 50 MG tablet, TAKE 1 TO 2 TABLETS BY MOUTH TWICE DAILY AS NEEDED, Disp: 100 tablet, Rfl: 3 .  traZODone (DESYREL) 50 MG tablet, TAKE 1/2 TO 1 TABLETS BY  MOUTH AT BEDTIME AS NEEDED  FOR SLEEP., Disp: 90 tablet, Rfl: 3 .  triamcinolone ointment (KENALOG) 0.1 %, Apply 1 application topically 2 (two) times daily as needed (rash)., Disp: 80 g, Rfl: 0   Ht Readings from Last 1 Encounters:  10/12/20 6' 3.5" (1.918 m)     Wt Readings from Last 3 Encounters:  10/12/20 224 lb 10.4 oz (101.9 kg)  10/05/20 223 lb (101.2 kg)  09/27/20 222 lb 12.8 oz (101.1 kg)     There is no height or weight on file to calculate BMI.   Social History   Tobacco Use  Smoking Status Former Smoker  . Years: 22.00  . Types: Cigars  . Quit date: 08/03/2020  . Years since quitting: 0.2  Smokeless Tobacco Never Used  Tobacco Comment   2-3 small cigars/day     Lab Results  Component Value Date   CHOL 104 10/19/2020   Lab Results  Component Value Date   HDL 45 10/19/2020   Lab Results  Component Value Date   LDLCALC 45 10/19/2020   Lab Results  Component Value Date   TRIG 65 10/19/2020     Lab Results  Component Value Date   HGBA1C 5.4 08/04/2020     CBG (last 3)  Recent Labs    10/20/20 1046  GLUCAP 120*     Nutrition Note  Spoke with pt. Nutrition Plan and Nutrition Survey goals reviewed with pt. Pt is trying to follow a Heart Healthy  diet. Picture your plate survey shows room for improvement.  Pt lost about 160 lbs many years ago by incorporating walking and healthier eating.  Pt has Type 2 Diabetes. Last A1c indicates blood glucose well-controlled. He does not check CBGs at home. He was previously on more diabetes medications and A1C was not meeting glycemic targets but has reduced medications and A1c with healthier eating habits, walking, and weight loss.   Per discussion, pt does use canned/convenience foods often. Pt does not add salt to food. Pt does not eat out frequently.  He drinks sugary beverages; 4-5 cans of crush soda per day.  Diet recall: Breakfast: typically something small. Yesterday he ate 1 egg, 1 white toast, and 1 piece bacon.  Lunch: nothing  Dinner: Artist, rice-a-roni, butterbeans/corn Snack: 2 small scoops vanilla ice cream  Pt interested in learning more about a heart healthy diet.   Pt expressed understanding of the information reviewed.    Nutrition Diagnosis ? Food-and nutrition-related knowledge deficit related to lack of exposure to information as related to diagnosis of: ? CVD ? Type 2 Diabetes ?  Nutrition Intervention ? Pt's individual nutrition plan reviewed with pt. ? Benefits of adopting Heart Healthy diet discussed when Picture your plate reviewed.   ? Continue client-centered nutrition education by RD, as part of interdisciplinary care.  Goal(s) ? Pt to build a healthy plate including vegetables, fruits, whole grains, and low-fat dairy products in a heart healthy meal plan. ? Pt to reduce sugary beverages to <2 per day   Plan:   Will provide client-centered nutrition education as part of interdisciplinary care  Monitor and evaluate progress toward nutrition goal with team.   Michaele Offer, MS, RDN, LDN

## 2020-10-25 ENCOUNTER — Encounter (HOSPITAL_COMMUNITY): Payer: Medicare Other

## 2020-10-26 ENCOUNTER — Other Ambulatory Visit: Payer: Self-pay | Admitting: Physician Assistant

## 2020-10-27 ENCOUNTER — Encounter (HOSPITAL_COMMUNITY)
Admission: RE | Admit: 2020-10-27 | Discharge: 2020-10-27 | Disposition: A | Discharge status: Home / Self Care | Payer: Medicare Other | Source: Ambulatory Visit | Attending: Cardiovascular Disease | Admitting: Cardiovascular Disease

## 2020-10-27 ENCOUNTER — Other Ambulatory Visit: Payer: Self-pay

## 2020-10-27 DIAGNOSIS — Z951 Presence of aortocoronary bypass graft: Secondary | ICD-10-CM | POA: Diagnosis not present

## 2020-10-27 DIAGNOSIS — Z9861 Coronary angioplasty status: Secondary | ICD-10-CM | POA: Diagnosis not present

## 2020-10-27 DIAGNOSIS — I213 ST elevation (STEMI) myocardial infarction of unspecified site: Secondary | ICD-10-CM

## 2020-10-27 DIAGNOSIS — I2111 ST elevation (STEMI) myocardial infarction involving right coronary artery: Secondary | ICD-10-CM | POA: Diagnosis not present

## 2020-10-29 ENCOUNTER — Other Ambulatory Visit: Payer: Self-pay

## 2020-10-29 ENCOUNTER — Encounter (HOSPITAL_COMMUNITY)
Admission: RE | Admit: 2020-10-29 | Discharge: 2020-10-29 | Disposition: A | Discharge status: Home / Self Care | Payer: Medicare Other | Source: Ambulatory Visit | Attending: Cardiovascular Disease | Admitting: Cardiovascular Disease

## 2020-10-29 DIAGNOSIS — I213 ST elevation (STEMI) myocardial infarction of unspecified site: Secondary | ICD-10-CM

## 2020-10-29 DIAGNOSIS — Z9861 Coronary angioplasty status: Secondary | ICD-10-CM | POA: Diagnosis not present

## 2020-10-29 DIAGNOSIS — Z951 Presence of aortocoronary bypass graft: Secondary | ICD-10-CM

## 2020-10-29 DIAGNOSIS — I2111 ST elevation (STEMI) myocardial infarction involving right coronary artery: Secondary | ICD-10-CM | POA: Diagnosis not present

## 2020-10-29 NOTE — Progress Notes (Signed)
Reviewed home exercise guidelines with patient including endpoints, temperature precautions, target heart rate and rate of perceived exertion. Patient plans to walk as his mode of home exercise. Patient has 5 lb weights that he can use for his resistance exercise once he works up to that weight. Patient voices understanding of instructions given.  Sol Passer, MS, ACSM CEP

## 2020-10-31 DIAGNOSIS — E119 Type 2 diabetes mellitus without complications: Secondary | ICD-10-CM | POA: Diagnosis not present

## 2020-10-31 DIAGNOSIS — Z0100 Encounter for examination of eyes and vision without abnormal findings: Secondary | ICD-10-CM | POA: Diagnosis not present

## 2020-11-01 ENCOUNTER — Other Ambulatory Visit: Payer: Self-pay

## 2020-11-01 ENCOUNTER — Encounter (HOSPITAL_COMMUNITY)
Admission: RE | Admit: 2020-11-01 | Discharge: 2020-11-01 | Disposition: A | Discharge status: Home / Self Care | Payer: Medicare Other | Source: Ambulatory Visit | Attending: Cardiovascular Disease | Admitting: Cardiovascular Disease

## 2020-11-01 DIAGNOSIS — Z9861 Coronary angioplasty status: Secondary | ICD-10-CM

## 2020-11-01 DIAGNOSIS — Z951 Presence of aortocoronary bypass graft: Secondary | ICD-10-CM

## 2020-11-01 DIAGNOSIS — I2111 ST elevation (STEMI) myocardial infarction involving right coronary artery: Secondary | ICD-10-CM | POA: Diagnosis not present

## 2020-11-01 DIAGNOSIS — I213 ST elevation (STEMI) myocardial infarction of unspecified site: Secondary | ICD-10-CM

## 2020-11-03 ENCOUNTER — Encounter (HOSPITAL_COMMUNITY)
Admission: RE | Admit: 2020-11-03 | Discharge: 2020-11-03 | Disposition: A | Discharge status: Home / Self Care | Payer: Medicare Other | Source: Ambulatory Visit | Attending: Cardiovascular Disease | Admitting: Cardiovascular Disease

## 2020-11-03 ENCOUNTER — Other Ambulatory Visit: Payer: Self-pay

## 2020-11-03 DIAGNOSIS — Z951 Presence of aortocoronary bypass graft: Secondary | ICD-10-CM | POA: Diagnosis not present

## 2020-11-03 DIAGNOSIS — Z9861 Coronary angioplasty status: Secondary | ICD-10-CM | POA: Diagnosis not present

## 2020-11-03 DIAGNOSIS — I2111 ST elevation (STEMI) myocardial infarction involving right coronary artery: Secondary | ICD-10-CM | POA: Diagnosis not present

## 2020-11-03 DIAGNOSIS — I213 ST elevation (STEMI) myocardial infarction of unspecified site: Secondary | ICD-10-CM

## 2020-11-05 ENCOUNTER — Encounter (HOSPITAL_COMMUNITY): Payer: Medicare Other

## 2020-11-08 ENCOUNTER — Encounter (HOSPITAL_COMMUNITY)
Admission: RE | Admit: 2020-11-08 | Discharge: 2020-11-08 | Disposition: A | Discharge status: Home / Self Care | Payer: Medicare Other | Source: Ambulatory Visit | Attending: Cardiovascular Disease | Admitting: Cardiovascular Disease

## 2020-11-08 ENCOUNTER — Other Ambulatory Visit: Payer: Self-pay

## 2020-11-08 DIAGNOSIS — I213 ST elevation (STEMI) myocardial infarction of unspecified site: Secondary | ICD-10-CM

## 2020-11-08 DIAGNOSIS — Z951 Presence of aortocoronary bypass graft: Secondary | ICD-10-CM | POA: Diagnosis not present

## 2020-11-08 DIAGNOSIS — I2111 ST elevation (STEMI) myocardial infarction involving right coronary artery: Secondary | ICD-10-CM | POA: Diagnosis not present

## 2020-11-08 DIAGNOSIS — Z9861 Coronary angioplasty status: Secondary | ICD-10-CM | POA: Diagnosis not present

## 2020-11-10 ENCOUNTER — Other Ambulatory Visit: Payer: Self-pay

## 2020-11-10 ENCOUNTER — Encounter (HOSPITAL_COMMUNITY)
Admission: RE | Admit: 2020-11-10 | Discharge: 2020-11-10 | Disposition: A | Discharge status: Home / Self Care | Payer: Medicare Other | Source: Ambulatory Visit | Attending: Cardiovascular Disease | Admitting: Cardiovascular Disease

## 2020-11-10 DIAGNOSIS — I252 Old myocardial infarction: Secondary | ICD-10-CM | POA: Insufficient documentation

## 2020-11-10 DIAGNOSIS — I213 ST elevation (STEMI) myocardial infarction of unspecified site: Secondary | ICD-10-CM

## 2020-11-10 DIAGNOSIS — Z9861 Coronary angioplasty status: Secondary | ICD-10-CM | POA: Diagnosis not present

## 2020-11-10 DIAGNOSIS — Z951 Presence of aortocoronary bypass graft: Secondary | ICD-10-CM | POA: Insufficient documentation

## 2020-11-12 ENCOUNTER — Other Ambulatory Visit: Payer: Self-pay

## 2020-11-12 ENCOUNTER — Encounter (HOSPITAL_COMMUNITY)
Admission: RE | Admit: 2020-11-12 | Discharge: 2020-11-12 | Disposition: A | Discharge status: Home / Self Care | Payer: Medicare Other | Source: Ambulatory Visit | Attending: Cardiovascular Disease | Admitting: Cardiovascular Disease

## 2020-11-12 VITALS — Wt 226.9 lb

## 2020-11-12 DIAGNOSIS — Z9861 Coronary angioplasty status: Secondary | ICD-10-CM | POA: Diagnosis not present

## 2020-11-12 DIAGNOSIS — Z951 Presence of aortocoronary bypass graft: Secondary | ICD-10-CM | POA: Diagnosis not present

## 2020-11-12 DIAGNOSIS — I213 ST elevation (STEMI) myocardial infarction of unspecified site: Secondary | ICD-10-CM

## 2020-11-12 DIAGNOSIS — I252 Old myocardial infarction: Secondary | ICD-10-CM | POA: Diagnosis not present

## 2020-11-12 NOTE — Progress Notes (Signed)
Cardiac Individual Treatment Plan  Patient Details  Name: Brian Mccullough MRN: [id number] Date of Birth: 1950/10/17 Referring Provider:   Flowsheet Row CARDIAC REHAB PHASE II ORIENTATION from 10/12/2020 in [facility]  Referring Provider Sherren Mocha, MD      Initial Encounter Date:  [place] PHASE II ORIENTATION from 10/12/2020 in [facility]  Date 10/12/20      Visit Diagnosis: 08/04/20 CABG x6  08/03/20 STEMI  08/03/20 PTCA RCA  Patient's Home Medications on Admission:  Current Outpatient Medications:  .  amiodarone (PACERONE) 200 MG tablet, Take 1 tablet (200 mg total) by mouth daily., Disp: 90 tablet, Rfl: 3 .  aspirin 81 MG EC tablet, Take 81 mg by mouth daily., Disp: , Rfl:  .  atorvastatin (LIPITOR) 80 MG tablet, Take 1 tablet (80 mg total) by mouth daily., Disp: 30 tablet, Rfl: 3 .  benazepril (LOTENSIN) 20 MG tablet, Take 1 tablet (20 mg total) by mouth daily., Disp: 90 tablet, Rfl: 3 .  Cholecalciferol (VITAMIN D3) 1000 UNITS tablet, Take 1,000 Units by mouth daily., Disp: , Rfl:  .  clopidogrel (PLAVIX) 75 MG tablet, Take 1 tablet (75 mg total) by mouth daily., Disp: 30 tablet, Rfl: 3 .  cyanocobalamin 100 MCG tablet, Take 1,000 mcg by mouth daily., Disp: , Rfl:  .  furosemide (LASIX) 20 MG tablet, Take 1 tablet (20 mg total) by mouth as needed for fluid or edema., Disp: 30 tablet, Rfl: 3 .  loratadine (CLARITIN) 10 MG tablet, Take 10 mg by mouth daily as needed for allergies or rhinitis., Disp: , Rfl:  .  meloxicam (MOBIC) 15 MG tablet, Take 1 tablet (15 mg total) by mouth daily as needed. for pain, Disp: 90 tablet, Rfl: 3 .  metFORMIN (GLUCOPHAGE) 500 MG tablet, Take 1 tablet (500 mg total) by mouth daily with breakfast., Disp: 90 tablet, Rfl: 3 .  metoprolol tartrate (LOPRESSOR) 25 MG tablet, Take 0.5 tablets (12.5 mg total) by mouth 2 (two) times daily., Disp: 90 tablet, Rfl: 3 .   potassium chloride SA (KLOR-CON) 20 MEQ tablet, Take 1 tablet (20 mEq total) by mouth daily., Disp: 30 tablet, Rfl: 11 .  tiZANidine (ZANAFLEX) 4 MG tablet, TAKE 1 TABLET BY MOUTH  EVERY 8 HOURS AS NEEDED FOR MUSCLE SPASM(S), Disp: 270 tablet, Rfl: 0 .  traMADol (ULTRAM) 50 MG tablet, TAKE 1 TO 2 TABLETS BY MOUTH TWICE DAILY AS NEEDED, Disp: 100 tablet, Rfl: 3 .  traZODone (DESYREL) 50 MG tablet, TAKE 1/2 TO 1 TABLETS BY  MOUTH AT BEDTIME AS NEEDED  FOR SLEEP., Disp: 90 tablet, Rfl: 3 .  triamcinolone ointment (KENALOG) 0.1 %, Apply 1 application topically 2 (two) times daily as needed (rash)., Disp: 80 g, Rfl: 0  Past Medical History: Past Medical History:  Diagnosis Date  . Allergy   . CAD (coronary artery disease)    s/p Inf STEMI 10/21>>POBA to RCA >> s/p CABG (L-LAD, S-PDA, L Radial-OM1/RI, RIMA-RPLA) // post op AF; SVT >> Amiod Rx // Echo 10/21: EF 55-60, no RWMA, GLS -18.5, normal RVSF, mild LAE, trivial MR   . Carotid artery disease ([place])    [place] 10/21: R 1-39; L 40-59  . Hyperactive gag reflex   . Hypertension    under control with meds., has been on med. x 30 yr.  . Inguinal hernia 01/2014   bilateral  . LBP (low back pain)   . Non-insulin dependent type 2  diabetes mellitus ([place])   . OA (osteoarthritis) of knee 01/2004   left  . Palpitations   . s/p Inf STEMI 07/2020   POBA to RCA >> CABG  . Vitamin B 12 deficiency     Tobacco Use: Social History   Tobacco Use  Smoking Status Former Smoker  . Years: 22.00  . Types: Cigars  . Quit date: 08/03/2020  . Years since quitting: 0.2  Smokeless Tobacco Never Used  Tobacco Comment   2-3 small cigars/day    Labs: Recent Review Flowsheet Data    Labs for ITP Cardiac and Pulmonary Rehab Latest Ref Rng & Units 08/04/2020 08/04/2020 08/04/2020 08/04/2020 10/19/2020   Cholestrol 100 - 199 mg/dL - - - - 104   LDLCALC 0 - 99 mg/dL - - - - 45   LDLDIRECT mg/dL - - - - -   HDL >39 mg/dL - - - - 45   Trlycerides 0 - 149 mg/dL - - -  - 65   Hemoglobin A1c 4.8 - 5.6 % - - - - -   PHART 7.350 - 7.450 7.341(L) 7.345(L) 7.307(L) 7.323(L) -   PCO2ART 32.0 - 48.0 mmHg 41.2 38.0 41.3 41.1 -   HCO3 20.0 - 28.0 mmol/L 22.6 20.8 20.6 21.3 -   TCO2 22 - 32 mmol/L 24 22 22 22  -   ACIDBASEDEF 0.0 - 2.0 mmol/L 3.0(H) 5.0(H) 5.0(H) 4.0(H) -   O2SAT % 99.0 99.0 99.0 99.0 -      Capillary Blood Glucose: Lab Results  Component Value Date   GLUCAP 120 (H) 10/20/2020   GLUCAP 75 10/18/2020   GLUCAP 85 10/18/2020   GLUCAP 91 08/09/2020   GLUCAP 165 (H) 08/08/2020     Exercise Target Goals: Exercise Program Goal: Individual exercise prescription set using results from initial 6 min walk test and THRR while considering  patient's activity barriers and safety.   Exercise Prescription Goal: Initial exercise prescription builds to 30-45 minutes a day of aerobic activity, 2-3 days per week.  Home exercise guidelines will be given to patient during program as part of exercise prescription that the participant will acknowledge.  Activity Barriers & Risk Stratification:  Activity Barriers & Cardiac Risk Stratification - 10/12/20 1034      Activity Barriers & Cardiac Risk Stratification   Activity Barriers Left Knee Replacement    Cardiac Risk Stratification High           6 Minute Walk:  6 Minute Walk    Row Name 10/12/20 1047         6 Minute Walk   Phase Initial     Distance 1626 feet     Walk Time 6 minutes     # of Rest Breaks 0     MPH 3.08     METS 3.49     RPE 9     Perceived Dyspnea  0     VO2 Peak 12.23     Symptoms No     Resting HR 63 bpm     Resting BP 122/72     Resting Oxygen Saturation  100 %     Exercise Oxygen Saturation  during 6 min walk 98 %     Max Ex. HR 74 bpm     Max Ex. BP 142/82     2 Minute Post BP 140/80            Oxygen Initial Assessment:   Oxygen Re-Evaluation:   Oxygen Discharge (Final Oxygen Re-Evaluation):  Initial Exercise Prescription:  Initial Exercise  Prescription - 10/12/20 1300      Date of Initial Exercise RX and Referring Provider   Date 10/12/20    Referring Provider Sherren Mocha, MD    Expected Discharge Date 12/10/20      Treadmill   MPH 2.8    Grade 0    Minutes 15    METs 3.14      NuStep   Level 3    SPM 85    Minutes 15    METs 3      Prescription Details   Frequency (times per week) 3    Duration Progress to 30 minutes of continuous aerobic without signs/symptoms of physical distress      Intensity   THRR 40-80% of Max Heartrate 61-122    Ratings of Perceived Exertion 11-13    Perceived Dyspnea 0-4      Progression   Progression Continue to progress workloads to maintain intensity without signs/symptoms of physical distress.      Resistance Training   Training Prescription Yes    Weight 3 lbs    Reps 10-15           Perform Capillary Blood Glucose checks as needed.  Exercise Prescription Changes:   Exercise Prescription Changes    Row Name 10/18/20 1052 11/01/20 1123 11/15/20 1055         Response to Exercise   Blood Pressure (Admit) 128/66 134/72 138/76     Blood Pressure (Exercise) 148/88 142/70 162/80     Blood Pressure (Exit) 118/68 120/72 114/70     Heart Rate (Admit) 70 bpm 60 bpm 65 bpm     Heart Rate (Exercise) 100 bpm 89 bpm 95 bpm     Heart Rate (Exit) 66 bpm 64 bpm 70 bpm     Rating of Perceived Exertion (Exercise) 13 11 11      Symptoms none none none     Comments Off to a good start with exercise. - -     Duration Continue with 30 min of aerobic exercise without signs/symptoms of physical distress. Continue with 30 min of aerobic exercise without signs/symptoms of physical distress. Continue with 30 min of aerobic exercise without signs/symptoms of physical distress.     Intensity THRR unchanged THRR unchanged THRR unchanged           Progression   Progression Continue to progress workloads to maintain intensity without signs/symptoms of physical distress. Continue to  progress workloads to maintain intensity without signs/symptoms of physical distress. Continue to progress workloads to maintain intensity without signs/symptoms of physical distress.     Average METs 3 3.3 3.9           Resistance Training   Training Prescription Yes Yes Yes     Weight 3 lbs 3 lbs 4 lbs     Reps 10-15 10-15 10-15     Time 10 Minutes 10 Minutes 10 Minutes           Interval Training   Interval Training No No No           NuStep   Level 3 3 4      SPM 85 85 85     Minutes 17 17 17      METs 2.7 3.1 4.1           Track   Laps 20 21 23      Minutes 15 15 15      METs 3.32 3.44 3.67  Home Exercise Plan   Plans to continue exercise at - Home (comment)  Walking Home (comment)  Walking     Frequency - Add 2 additional days to program exercise sessions. Add 2 additional days to program exercise sessions.     Initial Home Exercises Provided - 10/29/20 10/29/20            Exercise Comments:   Exercise Comments    Row Name 10/18/20 1158 10/29/20 1051 11/03/20 1140 11/15/20 1058     Exercise Comments Patient tolerated 1st exercise session well without symptoms. Switched from treadmill to track per patient preference. Reviewed home exercise guidelines and goals with patient. Reviewed METs with patient. Reviewed METs and goals with patient.           Exercise Goals and Review:   Exercise Goals    Row Name 10/12/20 1034             Exercise Goals   Increase Physical Activity Yes       Intervention Provide advice, education, support and counseling about physical activity/exercise needs.;Develop an individualized exercise prescription for aerobic and resistive training based on initial evaluation findings, risk stratification, comorbidities and participant's personal goals.       Expected Outcomes Short Term: Attend rehab on a regular basis to increase amount of physical activity.;Long Term: Exercising regularly at least 3-5 days a week.;Long Term: Add  in home exercise to make exercise part of routine and to increase amount of physical activity.       Increase Strength and Stamina Yes       Intervention Provide advice, education, support and counseling about physical activity/exercise needs.;Develop an individualized exercise prescription for aerobic and resistive training based on initial evaluation findings, risk stratification, comorbidities and participant's personal goals.       Expected Outcomes Short Term: Increase workloads from initial exercise prescription for resistance, speed, and METs.;Short Term: Perform resistance training exercises routinely during rehab and add in resistance training at home;Long Term: Improve cardiorespiratory fitness, muscular endurance and strength as measured by increased METs and functional capacity (6MWT)       Able to understand and use rate of perceived exertion (RPE) scale Yes       Intervention Provide education and explanation on how to use RPE scale       Expected Outcomes Short Term: Able to use RPE daily in rehab to express subjective intensity level;Long Term:  Able to use RPE to guide intensity level when exercising independently       Knowledge and understanding of Target Heart Rate Range (THRR) Yes       Intervention Provide education and explanation of THRR including how the numbers were predicted and where they are located for reference       Expected Outcomes Short Term: Able to state/look up THRR;Long Term: Able to use THRR to govern intensity when exercising independently;Short Term: Able to use daily as guideline for intensity in rehab       Able to check pulse independently Yes       Intervention Provide education and demonstration on how to check pulse in carotid and radial arteries.;Review the importance of being able to check your own pulse for safety during independent exercise       Expected Outcomes Short Term: Able to explain why pulse checking is important during independent  exercise;Long Term: Able to check pulse independently and accurately       Understanding of Exercise Prescription Yes  Intervention Provide education, explanation, and written materials on patient's individual exercise prescription       Expected Outcomes Short Term: Able to explain program exercise prescription;Long Term: Able to explain home exercise prescription to exercise independently              Exercise Goals Re-Evaluation :  Exercise Goals Re-Evaluation    [place] Name 10/18/20 1158 10/29/20 1051 11/15/20 1058         Exercise Goal Re-Evaluation   Exercise Goals Review Increase Physical Activity;Able to understand and use rate of perceived exertion (RPE) scale Increase Physical Activity;Able to understand and use rate of perceived exertion (RPE) scale;Increase Strength and Stamina;Understanding of Exercise Prescription;Knowledge and understanding of Target Heart Rate Range (THRR) Increase Physical Activity;Able to understand and use rate of perceived exertion (RPE) scale;Increase Strength and Stamina;Understanding of Exercise Prescription;Knowledge and understanding of Target Heart Rate Range (THRR)     Comments Patient able to understand and use RPE scale appropriately. Reviewed home exercise guidelines with patient including endpoints, temperature precautions, target heart rate and rate of perceived exertion. Patient plans to walk as his mode of home exercise. Patient has 5 lb weights that he can use for his resistance exercise once he works up to that weight. Patient's goal is to lose weight and increase muscle tone. Patient is walking 30 minutes, at least 5 days/week at home.     Expected Outcomes Progress workloads as tolerated to help improve cardiorespiratory fitness. Patient will add 1-2 days of walking 30 minutes at home in addition to exercise at cardiac rehab. Patient will continue walking routine at home in addition to exercise at cardiac rehab to help achieve personal health  and fitness goals.            Discharge Exercise Prescription (Final Exercise Prescription Changes):  Exercise Prescription Changes - 11/15/20 1055      Response to Exercise   Blood Pressure (Admit) 138/76    Blood Pressure (Exercise) 162/80    Blood Pressure (Exit) 114/70    Heart Rate (Admit) 65 bpm    Heart Rate (Exercise) 95 bpm    Heart Rate (Exit) 70 bpm    Rating of Perceived Exertion (Exercise) 11    Symptoms none    Duration Continue with 30 min of aerobic exercise without signs/symptoms of physical distress.    Intensity THRR unchanged      Progression   Progression Continue to progress workloads to maintain intensity without signs/symptoms of physical distress.    Average METs 3.9      Resistance Training   Training Prescription Yes    Weight 4 lbs    Reps 10-15    Time 10 Minutes      Interval Training   Interval Training No      NuStep   Level 4    SPM 85    Minutes 17    METs 4.1      Track   Laps 23    Minutes 15    METs 3.67      Home Exercise Plan   Plans to continue exercise at Home (comment)   Walking   Frequency Add 2 additional days to program exercise sessions.    Initial Home Exercises Provided 10/29/20           Nutrition:  Target Goals: Understanding of nutrition guidelines, daily intake of sodium 1500mg , cholesterol 200mg , calories 30% from fat and 7% or less from saturated fats, daily to have 5 or more servings  of fruits and vegetables.  Biometrics:  Pre Biometrics - 10/12/20 1036      Pre Biometrics   Waist Circumference 38 inches    Hip Circumference 41.5 inches    Waist to Hip Ratio 0.92 %    Triceps Skinfold 12 mm    % Body Fat 25 %    Grip Strength 41.5 kg    Flexibility 19.13 in    Single Leg Stand 5.62 seconds            Nutrition Therapy Plan and Nutrition Goals:  Nutrition Therapy & Goals - 11/15/20 1037      Nutrition Therapy   Diet TLC      Personal Nutrition Goals   Nutrition Goal Pt to build a  healthy plate including vegetables, fruits, whole grains, and low-fat dairy products in a heart healthy meal plan.    Personal Goal #2 Pt to reduce sugary beverages to <2 per day      Intervention Plan   Intervention Prescribe, educate and counsel regarding individualized specific dietary modifications aiming towards targeted core components such as weight, hypertension, lipid management, diabetes, heart failure and other comorbidities.;Nutrition handout(s) given to patient.    Expected Outcomes Short Term Goal: Understand basic principles of dietary content, such as calories, fat, sodium, cholesterol and nutrients.           Nutrition Assessments:  MEDIFICTS Score Key:  ?70 Need to make dietary changes   40-70 Heart Healthy Diet  ? 40 Therapeutic Level Cholesterol Diet   Flowsheet Row CARDIAC REHAB PHASE II EXERCISE from 10/20/2020 in [facility]  Picture Your Plate Total Score on Admission 50     Picture Your Plate Scores:  [id number] Unhealthy dietary pattern with much room for improvement.  41-50 Dietary pattern unlikely to meet recommendations for good health and room for improvement.  51-60 More healthful dietary pattern, with some room for improvement.   >60 Healthy dietary pattern, although there may be some specific behaviors that could be improved.    Nutrition Goals Re-Evaluation:  Nutrition Goals Re-Evaluation    [place] Name 11/15/20 1038             Goals   Current Weight 226 lb 13.7 oz (102.9 kg)       Nutrition Goal Pt to build a healthy plate including vegetables, fruits, whole grains, and low-fat dairy products in a heart healthy meal plan.               Personal Goal #2 Re-Evaluation   Personal Goal #2 Pt to reduce sugary beverages to <2 per day              Nutrition Goals Re-Evaluation:  Nutrition Goals Re-Evaluation    [place] Name 11/15/20 1038             Goals   Current Weight 226 lb 13.7 oz (102.9 kg)        Nutrition Goal Pt to build a healthy plate including vegetables, fruits, whole grains, and low-fat dairy products in a heart healthy meal plan.               Personal Goal #2 Re-Evaluation   Personal Goal #2 Pt to reduce sugary beverages to <2 per day              Nutrition Goals Discharge (Final Nutrition Goals Re-Evaluation):  Nutrition Goals Re-Evaluation - 11/15/20 1038      Goals   Current Weight 226 lb  13.7 oz (102.9 kg)    Nutrition Goal Pt to build a healthy plate including vegetables, fruits, whole grains, and low-fat dairy products in a heart healthy meal plan.      Personal Goal #2 Re-Evaluation   Personal Goal #2 Pt to reduce sugary beverages to <2 per day           Psychosocial: Target Goals: Acknowledge presence or absence of significant depression and/or stress, maximize coping skills, provide positive support system. Participant is able to verbalize types and ability to use techniques and skills needed for reducing stress and depression.  Initial Review & Psychosocial Screening:  Initial Psych Review & Screening - 10/13/20 1132      Initial Review   Current issues with None Identified      Family Dynamics   Good Support System? Yes   Rosevelt has his wife for support     Barriers   Psychosocial barriers to participate in program There are no identifiable barriers or psychosocial needs.      Screening Interventions   Interventions Encouraged to exercise           Quality of Life Scores:  Quality of Life - 10/12/20 1349      Quality of Life   Select Quality of Life      Quality of Life Scores   Health/Function Pre 28 %    Socioeconomic Pre 28.29 %    Psych/Spiritual Pre 30 %    Family Pre 28.8 %    GLOBAL Pre 28.59 %          Scores of 19 and below usually indicate a poorer quality of life in these areas.  A difference of  2-3 points is a clinically meaningful difference.  A difference of 2-3 points in the total score of the Quality of Life  Index has been associated with significant improvement in overall quality of life, self-image, physical symptoms, and general health in studies assessing change in quality of life.  PHQ-9: Recent Review Flowsheet Data    Depression screen [facility] 2/9 10/12/2020 07/30/2020 12/22/2019 12/20/2018 04/09/2017   Decreased Interest 0 0 0 0 0   Down, Depressed, Hopeless 0 0 0 0 0   PHQ - 2 Score 0 0 0 0 0     Interpretation of Total Score  Total Score Depression Severity:  1-4 = Minimal depression, 5-9 = Mild depression, 10-14 = Moderate depression, 15-19 = Moderately severe depression, 20-27 = Severe depression   Psychosocial Evaluation and Intervention:   Psychosocial Re-Evaluation:  Psychosocial Re-Evaluation    Row Name 10/19/20 0811 11/11/20 0820           Psychosocial Re-Evaluation   Current issues with None Identified None Identified      Interventions Encouraged to attend Cardiac Rehabilitation for the exercise Encouraged to attend Cardiac Rehabilitation for the exercise      Continue Psychosocial Services  No Follow up required No Follow up required             Psychosocial Discharge (Final Psychosocial Re-Evaluation):  Psychosocial Re-Evaluation - 11/11/20 0820      Psychosocial Re-Evaluation   Current issues with None Identified    Interventions Encouraged to attend Cardiac Rehabilitation for the exercise    Continue Psychosocial Services  No Follow up required           Vocational Rehabilitation: Provide vocational rehab assistance to qualifying candidates.   Vocational Rehab Evaluation & Intervention:  Vocational Rehab - 10/13/20 1148  Initial Vocational Rehab Evaluation & Intervention   Assessment shows need for Vocational Rehabilitation No   Anshul is retired/ disabled and does not need vocational rehab at this time          Education: Education Goals: Education classes will be provided on a weekly basis, covering required topics. Participant will state  understanding/return demonstration of topics presented.  Learning Barriers/Preferences:  Learning Barriers/Preferences - 10/13/20 1134      Learning Barriers/Preferences   Learning Barriers Sight   Reading glasses   Learning Preferences Group Instruction;Verbal Instruction;Skilled Demonstration           Education Topics: Count Your Pulse:  -Group instruction provided by verbal instruction, demonstration, patient participation and written materials to support subject.  Instructors address importance of being able to find your pulse and how to count your pulse when at home without a heart monitor.  Patients get hands on experience counting their pulse with staff help and individually.   Heart Attack, Angina, and Risk Factor Modification:  -Group instruction provided by verbal instruction, video, and written materials to support subject.  Instructors address signs and symptoms of angina and heart attacks.    Also discuss risk factors for heart disease and how to make changes to improve heart health risk factors.   Functional Fitness:  -Group instruction provided by verbal instruction, demonstration, patient participation, and written materials to support subject.  Instructors address safety measures for doing things around the house.  Discuss how to get up and down off the floor, how to pick things up properly, how to safely get out of a chair without assistance, and balance training.   Meditation and Mindfulness:  -Group instruction provided by verbal instruction, patient participation, and written materials to support subject.  Instructor addresses importance of mindfulness and meditation practice to help reduce stress and improve awareness.  Instructor also leads participants through a meditation exercise.    Stretching for Flexibility and Mobility:  -Group instruction provided by verbal instruction, patient participation, and written materials to support subject.  Instructors lead  participants through series of stretches that are designed to increase flexibility thus improving mobility.  These stretches are additional exercise for major muscle groups that are typically performed during regular warm up and cool down.   Hands Only CPR:  -Group verbal, video, and participation provides a basic overview of AHA guidelines for community CPR. Role-play of emergencies allow participants the opportunity to practice calling for help and chest compression technique with discussion of AED use.   Hypertension: -Group verbal and written instruction that provides a basic overview of hypertension including the most recent diagnostic guidelines, risk factor reduction with self-care instructions and medication management.    Nutrition I class: Heart Healthy Eating:  -Group instruction provided by PowerPoint slides, verbal discussion, and written materials to support subject matter. The instructor gives an explanation and review of the Therapeutic Lifestyle Changes diet recommendations, which includes a discussion on lipid goals, dietary fat, sodium, fiber, plant stanol/sterol esters, sugar, and the components of a well-balanced, healthy diet.   Nutrition II class: Lifestyle Skills:  -Group instruction provided by PowerPoint slides, verbal discussion, and written materials to support subject matter. The instructor gives an explanation and review of label reading, grocery shopping for heart health, heart healthy recipe modifications, and ways to make healthier choices when eating out.   Diabetes Question & Answer:  -Group instruction provided by PowerPoint slides, verbal discussion, and written materials to support subject matter. The instructor gives an explanation  and review of diabetes co-morbidities, pre- and post-prandial blood glucose goals, pre-exercise blood glucose goals, signs, symptoms, and treatment of hypoglycemia and hyperglycemia, and foot care basics.   Diabetes Blitz:   -Group instruction provided by PowerPoint slides, verbal discussion, and written materials to support subject matter. The instructor gives an explanation and review of the physiology behind type 1 and type 2 diabetes, diabetes medications and rational behind using different medications, pre- and post-prandial blood glucose recommendations and Hemoglobin A1c goals, diabetes diet, and exercise including blood glucose guidelines for exercising safely.    Portion Distortion:  -Group instruction provided by PowerPoint slides, verbal discussion, written materials, and food models to support subject matter. The instructor gives an explanation of serving size versus portion size, changes in portions sizes over the last 20 years, and what consists of a serving from each food group.   Stress Management:  -Group instruction provided by verbal instruction, video, and written materials to support subject matter.  Instructors review role of stress in heart disease and how to cope with stress positively.     Exercising on Your Own:  -Group instruction provided by verbal instruction, power point, and written materials to support subject.  Instructors discuss benefits of exercise, components of exercise, frequency and intensity of exercise, and end points for exercise.  Also discuss use of nitroglycerin and activating EMS.  Review options of places to exercise outside of rehab.  Review guidelines for sex with heart disease.   Cardiac Drugs I:  -Group instruction provided by verbal instruction and written materials to support subject.  Instructor reviews cardiac drug classes: antiplatelets, anticoagulants, beta blockers, and statins.  Instructor discusses reasons, side effects, and lifestyle considerations for each drug class.   Cardiac Drugs II:  -Group instruction provided by verbal instruction and written materials to support subject.  Instructor reviews cardiac drug classes: angiotensin converting enzyme  inhibitors (ACE-I), angiotensin II receptor blockers (ARBs), nitrates, and calcium channel blockers.  Instructor discusses reasons, side effects, and lifestyle considerations for each drug class.   Anatomy and Physiology of the Circulatory System:  Group verbal and written instruction and models provide basic cardiac anatomy and physiology, with the coronary electrical and arterial systems. Review of: AMI, Angina, Valve disease, Heart Failure, Peripheral Artery Disease, Cardiac Arrhythmia, Pacemakers, and the ICD.   Other Education:  -Group or individual verbal, written, or video instructions that support the educational goals of the cardiac rehab program.   Holiday Eating Survival Tips:  -Group instruction provided by PowerPoint slides, verbal discussion, and written materials to support subject matter. The instructor gives patients tips, tricks, and techniques to help them not only survive but enjoy the holidays despite the onslaught of food that accompanies the holidays.   Knowledge Questionnaire Score:  Knowledge Questionnaire Score - 10/12/20 1350      Knowledge Questionnaire Score   Pre Score 22/24           Core Components/Risk Factors/Patient Goals at Admission:  Personal Goals and Risk Factors at Admission - 10/13/20 1149      Core Components/Risk Factors/Patient Goals on Admission    Weight Management Yes;Weight Maintenance;Weight Loss    Intervention Weight Management: Develop a combined nutrition and exercise program designed to reach desired caloric intake, while maintaining appropriate intake of nutrient and fiber, sodium and fats, and appropriate energy expenditure required for the weight goal.;Weight Management: Provide education and appropriate resources to help participant work on and attain dietary goals.;Weight Management/Obesity: Establish reasonable short term and long term weight  goals.    Expected Outcomes Short Term: Continue to assess and modify interventions  until short term weight is achieved;Long Term: Adherence to nutrition and physical activity/exercise program aimed toward attainment of established weight goal;Weight Loss: Understanding of general recommendations for a balanced deficit meal plan, which promotes 1-2 lb weight loss per week and includes a negative energy balance of [phone] kcal/d;Understanding recommendations for meals to include 15-35% energy as protein, 25-35% energy from fat, 35-60% energy from carbohydrates, less than 200mg  of dietary cholesterol, 20-35 gm of total fiber daily;Understanding of distribution of calorie intake throughout the day with the consumption of 4-5 meals/snacks    Tobacco Cessation Yes    Number of packs per day Spyros quit smoking in October    Intervention Assist the participant in steps to quit. Provide individualized education and counseling about committing to Tobacco Cessation, relapse prevention, and pharmacological support that can be provided by physician.    Expected Outcomes Long Term: Complete abstinence from all tobacco products for at least 12 months from quit date.    Diabetes Yes    Intervention Provide education about signs/symptoms and action to take for hypo/hyperglycemia.;Provide education about proper nutrition, including hydration, and aerobic/resistive exercise prescription along with prescribed medications to achieve blood glucose in normal ranges: Fasting glucose 65-99 mg/dL    Expected Outcomes Short Term: Participant verbalizes understanding of the signs/symptoms and immediate care of hyper/hypoglycemia, proper foot care and importance of medication, aerobic/resistive exercise and nutrition plan for blood glucose control.;Long Term: Attainment of HbA1C < 7%.    Hypertension Yes    Intervention Provide education on lifestyle modifcations including regular physical activity/exercise, weight management, moderate sodium restriction and increased consumption of fresh fruit, vegetables, and  low fat dairy, alcohol moderation, and smoking cessation.;Monitor prescription use compliance.    Expected Outcomes Short Term: Continued assessment and intervention until BP is < 140/36mm HG in hypertensive participants. < 130/56mm HG in hypertensive participants with diabetes, heart failure or chronic kidney disease.;Long Term: Maintenance of blood pressure at goal levels.    Lipids Yes    Intervention Provide education and support for participant on nutrition & aerobic/resistive exercise along with prescribed medications to achieve LDL 70mg , HDL >40mg .    Expected Outcomes Short Term: Participant states understanding of desired cholesterol values and is compliant with medications prescribed. Participant is following exercise prescription and nutrition guidelines.;Long Term: Cholesterol controlled with medications as prescribed, with individualized exercise RX and with personalized nutrition plan. Value goals: LDL < 70mg , HDL > 40 mg.           Core Components/Risk Factors/Patient Goals Review:   Goals and Risk Factor Review    Row Name 10/19/20 [id number] 11/11/20 [id number]           Core Components/Risk Factors/Patient Goals Review   Personal Goals Review Weight Management/Obesity;Lipids;Hypertension;Diabetes;Tobacco Cessation Weight Management/Obesity;Hypertension;Lipids;Diabetes      Review Shabazz started exercise on 10/18/20. Patient did well with exercise. Vital signs stable. CBG WNL. Rilee does not check his CBG's at home Stephon continues to do well with exercise. Advit's vital signs and weights have been stable.      Expected Outcomes Danni will continue to participate in phase 2 cardiac rehab for exercise nutrition and lifestyle modifications Teagan will continue to participate in phase 2 cardiac rehab for exercise, nutrtion and lifestyle modifications             Core Components/Risk Factors/Patient Goals at Discharge (Final Review):   Goals and Risk Factor Review - 11/11/20 [id number]  Core Components/Risk Factors/Patient Goals Review   Personal Goals Review Weight Management/Obesity;Hypertension;Lipids;Diabetes    Review Berne continues to do well with exercise. Arbor's vital signs and weights have been stable.    Expected Outcomes Dayron will continue to participate in phase 2 cardiac rehab for exercise, nutrtion and lifestyle modifications           ITP Comments:  ITP Comments    Row Name 10/12/20 1036 10/19/20 0809 11/11/20 [id number]       ITP Comments Medical Director- Dr. Fransico Him, MD 30 Day ITP review. Kardell started exercise on 10/19/20 and did well. VSS. 30 Day ITP Review. Rucker has good attendnace and participation in phase 2 cardiac rehab            Comments: See ITP comments

## 2020-11-15 ENCOUNTER — Other Ambulatory Visit: Payer: Self-pay

## 2020-11-15 ENCOUNTER — Encounter (HOSPITAL_COMMUNITY)
Admission: RE | Admit: 2020-11-15 | Discharge: 2020-11-15 | Disposition: A | Discharge status: Home / Self Care | Payer: Medicare Other | Source: Ambulatory Visit | Attending: Cardiovascular Disease | Admitting: Cardiovascular Disease

## 2020-11-15 DIAGNOSIS — Z951 Presence of aortocoronary bypass graft: Secondary | ICD-10-CM | POA: Diagnosis not present

## 2020-11-15 DIAGNOSIS — I252 Old myocardial infarction: Secondary | ICD-10-CM | POA: Diagnosis not present

## 2020-11-15 DIAGNOSIS — I213 ST elevation (STEMI) myocardial infarction of unspecified site: Secondary | ICD-10-CM

## 2020-11-15 DIAGNOSIS — Z9861 Coronary angioplasty status: Secondary | ICD-10-CM | POA: Diagnosis not present

## 2020-11-17 ENCOUNTER — Other Ambulatory Visit: Payer: Self-pay

## 2020-11-17 ENCOUNTER — Encounter (HOSPITAL_COMMUNITY)
Admission: RE | Admit: 2020-11-17 | Discharge: 2020-11-17 | Disposition: A | Discharge status: Home / Self Care | Payer: Medicare Other | Source: Ambulatory Visit | Attending: Cardiovascular Disease | Admitting: Cardiovascular Disease

## 2020-11-17 DIAGNOSIS — Z9861 Coronary angioplasty status: Secondary | ICD-10-CM | POA: Diagnosis not present

## 2020-11-17 DIAGNOSIS — Z951 Presence of aortocoronary bypass graft: Secondary | ICD-10-CM | POA: Diagnosis not present

## 2020-11-17 DIAGNOSIS — I213 ST elevation (STEMI) myocardial infarction of unspecified site: Secondary | ICD-10-CM

## 2020-11-17 DIAGNOSIS — I252 Old myocardial infarction: Secondary | ICD-10-CM | POA: Diagnosis not present

## 2020-11-19 ENCOUNTER — Encounter (HOSPITAL_COMMUNITY)
Admission: RE | Admit: 2020-11-19 | Discharge: 2020-11-19 | Disposition: A | Discharge status: Home / Self Care | Payer: Medicare Other | Source: Ambulatory Visit | Attending: Cardiovascular Disease | Admitting: Cardiovascular Disease

## 2020-11-19 ENCOUNTER — Other Ambulatory Visit: Payer: Self-pay

## 2020-11-19 DIAGNOSIS — Z951 Presence of aortocoronary bypass graft: Secondary | ICD-10-CM | POA: Diagnosis not present

## 2020-11-19 DIAGNOSIS — I252 Old myocardial infarction: Secondary | ICD-10-CM | POA: Diagnosis not present

## 2020-11-19 DIAGNOSIS — Z9861 Coronary angioplasty status: Secondary | ICD-10-CM | POA: Diagnosis not present

## 2020-11-19 DIAGNOSIS — I213 ST elevation (STEMI) myocardial infarction of unspecified site: Secondary | ICD-10-CM

## 2020-11-22 ENCOUNTER — Other Ambulatory Visit: Payer: Self-pay

## 2020-11-22 ENCOUNTER — Encounter (HOSPITAL_COMMUNITY)
Admission: RE | Admit: 2020-11-22 | Discharge: 2020-11-22 | Disposition: A | Discharge status: Home / Self Care | Payer: Medicare Other | Source: Ambulatory Visit | Attending: Cardiovascular Disease | Admitting: Cardiovascular Disease

## 2020-11-22 DIAGNOSIS — Z9861 Coronary angioplasty status: Secondary | ICD-10-CM | POA: Diagnosis not present

## 2020-11-22 DIAGNOSIS — I252 Old myocardial infarction: Secondary | ICD-10-CM | POA: Diagnosis not present

## 2020-11-22 DIAGNOSIS — Z951 Presence of aortocoronary bypass graft: Secondary | ICD-10-CM

## 2020-11-22 DIAGNOSIS — I213 ST elevation (STEMI) myocardial infarction of unspecified site: Secondary | ICD-10-CM

## 2020-11-24 ENCOUNTER — Encounter (HOSPITAL_COMMUNITY)
Admission: RE | Admit: 2020-11-24 | Discharge: 2020-11-24 | Disposition: A | Discharge status: Home / Self Care | Payer: Medicare Other | Source: Ambulatory Visit | Attending: Cardiovascular Disease | Admitting: Cardiovascular Disease

## 2020-11-24 ENCOUNTER — Other Ambulatory Visit: Payer: Self-pay

## 2020-11-24 DIAGNOSIS — I252 Old myocardial infarction: Secondary | ICD-10-CM | POA: Diagnosis not present

## 2020-11-24 DIAGNOSIS — Z951 Presence of aortocoronary bypass graft: Secondary | ICD-10-CM

## 2020-11-24 DIAGNOSIS — I213 ST elevation (STEMI) myocardial infarction of unspecified site: Secondary | ICD-10-CM

## 2020-11-24 DIAGNOSIS — Z9861 Coronary angioplasty status: Secondary | ICD-10-CM | POA: Diagnosis not present

## 2020-11-24 NOTE — Progress Notes (Signed)
Nutrition Note  Reviewed heart healthy diet. Daily recommendation of 28-40 g fiber, <16 g saturated fat, 0 g trans fat, and <2000 mg sodium.Recommended eating pattern of lean protein at every meal or snack, 2 servings fatty fish per week, 3-5 servings non starchy vegetables per day, 2-3 servings fruit per day, 1 oz nuts per day. Provided handouts and recipes. Collaborated with pt for goal setting. Pt verbalized understanding.   Michaele Offer, MS, RDN, LDN

## 2020-11-26 ENCOUNTER — Other Ambulatory Visit: Payer: Self-pay

## 2020-11-26 ENCOUNTER — Encounter (HOSPITAL_COMMUNITY)
Admission: RE | Admit: 2020-11-26 | Discharge: 2020-11-26 | Disposition: A | Discharge status: Home / Self Care | Payer: Medicare Other | Source: Ambulatory Visit | Attending: Cardiovascular Disease | Admitting: Cardiovascular Disease

## 2020-11-26 DIAGNOSIS — Z9861 Coronary angioplasty status: Secondary | ICD-10-CM | POA: Diagnosis not present

## 2020-11-26 DIAGNOSIS — Z951 Presence of aortocoronary bypass graft: Secondary | ICD-10-CM | POA: Diagnosis not present

## 2020-11-26 DIAGNOSIS — I213 ST elevation (STEMI) myocardial infarction of unspecified site: Secondary | ICD-10-CM

## 2020-11-26 DIAGNOSIS — I252 Old myocardial infarction: Secondary | ICD-10-CM | POA: Diagnosis not present

## 2020-11-29 ENCOUNTER — Other Ambulatory Visit: Payer: Self-pay

## 2020-11-29 ENCOUNTER — Encounter (HOSPITAL_COMMUNITY)
Admission: RE | Admit: 2020-11-29 | Discharge: 2020-11-29 | Disposition: A | Discharge status: Home / Self Care | Payer: Medicare Other | Source: Ambulatory Visit | Attending: Cardiovascular Disease | Admitting: Cardiovascular Disease

## 2020-11-29 DIAGNOSIS — I252 Old myocardial infarction: Secondary | ICD-10-CM | POA: Diagnosis not present

## 2020-11-29 DIAGNOSIS — I213 ST elevation (STEMI) myocardial infarction of unspecified site: Secondary | ICD-10-CM

## 2020-11-29 DIAGNOSIS — Z951 Presence of aortocoronary bypass graft: Secondary | ICD-10-CM

## 2020-11-29 DIAGNOSIS — Z9861 Coronary angioplasty status: Secondary | ICD-10-CM

## 2020-11-29 MED ORDER — ATORVASTATIN CALCIUM 80 MG PO TABS: 80.0000 mg | ORAL_TABLET | Freq: Every day | ORAL | 3 refills | Status: DC

## 2020-11-29 MED ORDER — CLOPIDOGREL BISULFATE 75 MG PO TABS: 75.0000 mg | ORAL_TABLET | Freq: Every day | ORAL | 3 refills | Status: DC

## 2020-11-29 NOTE — Telephone Encounter (Signed)
Pt's medication was sent to pt's pharmacy as requested. Confirmation received.  °

## 2020-12-01 ENCOUNTER — Other Ambulatory Visit: Payer: Self-pay

## 2020-12-01 ENCOUNTER — Encounter (HOSPITAL_COMMUNITY)
Admission: RE | Admit: 2020-12-01 | Discharge: 2020-12-01 | Disposition: A | Discharge status: Home / Self Care | Payer: Medicare Other | Source: Ambulatory Visit | Attending: Cardiovascular Disease | Admitting: Cardiovascular Disease

## 2020-12-01 DIAGNOSIS — Z9861 Coronary angioplasty status: Secondary | ICD-10-CM

## 2020-12-01 DIAGNOSIS — I213 ST elevation (STEMI) myocardial infarction of unspecified site: Secondary | ICD-10-CM

## 2020-12-01 DIAGNOSIS — I252 Old myocardial infarction: Secondary | ICD-10-CM | POA: Diagnosis not present

## 2020-12-01 DIAGNOSIS — Z951 Presence of aortocoronary bypass graft: Secondary | ICD-10-CM

## 2020-12-02 ENCOUNTER — Ambulatory Visit (INDEPENDENT_AMBULATORY_CARE_PROVIDER_SITE_OTHER): Payer: Medicare Other | Admitting: Internal Medicine

## 2020-12-02 ENCOUNTER — Encounter: Payer: Self-pay | Admitting: Internal Medicine

## 2020-12-02 VITALS — BP 128/70 | HR 45 | Temp 98.0°F | Ht 76.0 in | Wt 227.4 lb

## 2020-12-02 DIAGNOSIS — I10 Essential (primary) hypertension: Secondary | ICD-10-CM

## 2020-12-02 DIAGNOSIS — I6523 Occlusion and stenosis of bilateral carotid arteries: Secondary | ICD-10-CM | POA: Diagnosis not present

## 2020-12-02 DIAGNOSIS — E119 Type 2 diabetes mellitus without complications: Secondary | ICD-10-CM | POA: Diagnosis not present

## 2020-12-02 DIAGNOSIS — I6529 Occlusion and stenosis of unspecified carotid artery: Secondary | ICD-10-CM | POA: Insufficient documentation

## 2020-12-02 DIAGNOSIS — Z951 Presence of aortocoronary bypass graft: Secondary | ICD-10-CM

## 2020-12-02 DIAGNOSIS — E538 Deficiency of other specified B group vitamins: Secondary | ICD-10-CM

## 2020-12-02 NOTE — Assessment & Plan Note (Signed)
On B12 

## 2020-12-02 NOTE — Assessment & Plan Note (Signed)
No CP In Cradiac Rehab Plavix and Lipitor

## 2020-12-02 NOTE — Assessment & Plan Note (Signed)
On Metformin 

## 2020-12-02 NOTE — Assessment & Plan Note (Addendum)
On Toprol BP Readings from Last 3 Encounters:  12/02/20 128/70  10/12/20 122/72  10/05/20 (!) 120/50

## 2020-12-02 NOTE — Assessment & Plan Note (Signed)
10/21 Carot duplex Summary:  Right Carotid: Velocities in the right ICA are consistent with a 1-39%  stenosis.   Left Carotid: Velocities in the left ICA are consistent with a 40-59%  stenosis.  Vertebrals: Bilateral vertebral arteries demonstrate antegrade flow.   Plavix and Lipitor

## 2020-12-02 NOTE — Progress Notes (Signed)
Subjective:  Patient ID: Brian Mccullough, male    DOB: 09-28-1951  Age: 70 y.o. MRN: [id number]  CC: Follow-up (4 month f/u)   HPI [name] presents for CAD, DM, HTN In cardiac rehab  Outpatient Medications Prior to Visit  Medication Sig Dispense Refill  . amiodarone (PACERONE) 200 MG tablet Take 1 tablet (200 mg total) by mouth daily. 90 tablet 3  . aspirin 81 MG EC tablet Take 81 mg by mouth daily.    Marland Kitchen atorvastatin (LIPITOR) 80 MG tablet Take 1 tablet (80 mg total) by mouth daily. 90 tablet 3  . benazepril (LOTENSIN) 20 MG tablet Take 1 tablet (20 mg total) by mouth daily. 90 tablet 3  . Cholecalciferol (VITAMIN D3) 1000 UNITS tablet Take 1,000 Units by mouth daily.    . clopidogrel (PLAVIX) 75 MG tablet Take 1 tablet (75 mg total) by mouth daily. 90 tablet 3  . cyanocobalamin 100 MCG tablet Take 1,000 mcg by mouth daily.    . furosemide (LASIX) 20 MG tablet Take 1 tablet (20 mg total) by mouth as needed for fluid or edema. 30 tablet 3  . loratadine (CLARITIN) 10 MG tablet Take 10 mg by mouth daily as needed for allergies or rhinitis.    . meloxicam (MOBIC) 15 MG tablet Take 1 tablet (15 mg total) by mouth daily as needed. for pain 90 tablet 3  . metFORMIN (GLUCOPHAGE) 500 MG tablet Take 1 tablet (500 mg total) by mouth daily with breakfast. 90 tablet 3  . metoprolol tartrate (LOPRESSOR) 25 MG tablet Take 0.5 tablets (12.5 mg total) by mouth 2 (two) times daily. 90 tablet 3  . potassium chloride SA (KLOR-CON) 20 MEQ tablet Take 1 tablet (20 mEq total) by mouth daily. 30 tablet 11  . tiZANidine (ZANAFLEX) 4 MG tablet TAKE 1 TABLET BY MOUTH  EVERY 8 HOURS AS NEEDED FOR MUSCLE SPASM(S) 270 tablet 0  . traMADol (ULTRAM) 50 MG tablet TAKE 1 TO 2 TABLETS BY MOUTH TWICE DAILY AS NEEDED 100 tablet 3  . traZODone (DESYREL) 50 MG tablet TAKE 1/2 TO 1 TABLETS BY  MOUTH AT BEDTIME AS NEEDED  FOR SLEEP. 90 tablet 3  . triamcinolone ointment (KENALOG) 0.1 % Apply 1 application topically 2  (two) times daily as needed (rash). 80 g 0   No facility-administered medications prior to visit.    ROS: Review of Systems  Constitutional: Negative for appetite change, fatigue and unexpected weight change.  HENT: Negative for congestion, nosebleeds, sneezing, sore throat and trouble swallowing.   Eyes: Negative for itching and visual disturbance.  Respiratory: Negative for cough, chest tightness and shortness of breath.   Cardiovascular: Negative for chest pain, palpitations and leg swelling.  Gastrointestinal: Negative for abdominal distention, blood in stool, diarrhea and nausea.  Genitourinary: Negative for frequency and hematuria.  Musculoskeletal: Negative for back pain, gait problem, joint swelling and neck pain.  Skin: Negative for rash.  Neurological: Negative for dizziness, tremors, speech difficulty and weakness.  Psychiatric/Behavioral: Negative for agitation, dysphoric mood and sleep disturbance. The patient is not nervous/anxious.     Objective:  BP 128/70 (BP Location: Left Arm)   Pulse (!) 45   Temp 98 F (36.7 C) (Oral)   Ht 6\' 4"  (1.93 m)   Wt 227 lb 6.4 oz (103.1 kg)   SpO2 98%   BMI 27.68 kg/m   BP Readings from Last 3 Encounters:  12/02/20 128/70  10/12/20 122/72  10/05/20 (!) 120/50    [facility]  Readings from Last 3 Encounters:  12/02/20 227 lb 6.4 oz (103.1 kg)  11/15/20 226 lb 13.7 oz (102.9 kg)  10/12/20 224 lb 10.4 oz (101.9 kg)    Physical Exam Constitutional:      General: He is not in acute distress.    Appearance: He is well-developed.     Comments: NAD  HENT:     Mouth/Throat:     Mouth: Oropharynx is clear and moist.  Eyes:     Conjunctiva/sclera: Conjunctivae normal.     Pupils: Pupils are equal, round, and reactive to light.  Neck:     Thyroid: No thyromegaly.     Vascular: No JVD.  Cardiovascular:     Rate and Rhythm: Normal rate and regular rhythm.     Pulses: Intact distal pulses.     Heart sounds: Normal heart sounds. No  murmur heard. No friction rub. No gallop.   Pulmonary:     Effort: Pulmonary effort is normal. No respiratory distress.     Breath sounds: Normal breath sounds. No wheezing or rales.  Chest:     Chest wall: No tenderness.  Abdominal:     General: Bowel sounds are normal. There is no distension.     Palpations: Abdomen is soft. There is no mass.     Tenderness: There is no abdominal tenderness. There is no guarding or rebound.  Musculoskeletal:        General: No tenderness or edema. Normal range of motion.     Cervical back: Normal range of motion.  Lymphadenopathy:     Cervical: No cervical adenopathy.  Skin:    General: Skin is warm and dry.     Findings: No rash.  Neurological:     Mental Status: He is alert and oriented to person, place, and time.     Cranial Nerves: No cranial nerve deficit.     Motor: No abnormal muscle tone.     Coordination: He displays a negative Romberg sign. Coordination normal.     Gait: Gait normal.     Deep Tendon Reflexes: Reflexes are normal and symmetric.  Psychiatric:        Mood and Affect: Mood and affect normal.        Behavior: Behavior normal.        Thought Content: Thought content normal.        Judgment: Judgment normal.    chest, R forearm scars  Lab Results  Component Value Date   WBC 10.0 08/07/2020   HGB 10.6 (L) 08/07/2020   HCT 31.2 (L) 08/07/2020   PLT 122 (L) 08/07/2020   GLUCOSE 90 10/19/2020   CHOL 104 10/19/2020   TRIG 65 10/19/2020   HDL 45 10/19/2020   LDLDIRECT 88.1 01/23/2011   LDLCALC 45 10/19/2020   ALT 8 10/19/2020   AST 13 10/19/2020   NA 140 10/19/2020   K 3.9 10/19/2020   CL 106 10/19/2020   CREATININE 1.10 10/19/2020   BUN 13 10/19/2020   CO2 20 10/19/2020   TSH 0.673 08/04/2020   PSA 0.26 07/30/2020   INR 1.5 (H) 08/04/2020   HGBA1C 5.4 08/04/2020   MICROALBUR <0.2 04/26/2020    No results found.  Assessment & Plan:     Follow-up: No follow-ups on file.  Walker Kehr, MD

## 2020-12-03 ENCOUNTER — Encounter (HOSPITAL_COMMUNITY)
Admission: RE | Admit: 2020-12-03 | Discharge: 2020-12-03 | Disposition: A | Discharge status: Home / Self Care | Payer: Medicare Other | Source: Ambulatory Visit | Attending: Cardiovascular Disease | Admitting: Cardiovascular Disease

## 2020-12-03 ENCOUNTER — Other Ambulatory Visit: Payer: Self-pay

## 2020-12-03 ENCOUNTER — Other Ambulatory Visit: Payer: Self-pay | Admitting: Physician Assistant

## 2020-12-03 DIAGNOSIS — Z9861 Coronary angioplasty status: Secondary | ICD-10-CM

## 2020-12-03 DIAGNOSIS — Z951 Presence of aortocoronary bypass graft: Secondary | ICD-10-CM | POA: Diagnosis not present

## 2020-12-03 DIAGNOSIS — I252 Old myocardial infarction: Secondary | ICD-10-CM | POA: Diagnosis not present

## 2020-12-03 DIAGNOSIS — I213 ST elevation (STEMI) myocardial infarction of unspecified site: Secondary | ICD-10-CM

## 2020-12-06 ENCOUNTER — Other Ambulatory Visit: Payer: Self-pay

## 2020-12-06 ENCOUNTER — Other Ambulatory Visit: Payer: Self-pay | Admitting: Internal Medicine

## 2020-12-06 ENCOUNTER — Encounter (HOSPITAL_COMMUNITY)
Admission: RE | Admit: 2020-12-06 | Discharge: 2020-12-06 | Disposition: A | Discharge status: Home / Self Care | Payer: Medicare Other | Source: Ambulatory Visit | Attending: Cardiovascular Disease | Admitting: Cardiovascular Disease

## 2020-12-06 ENCOUNTER — Other Ambulatory Visit: Payer: Self-pay | Admitting: Physician Assistant

## 2020-12-06 DIAGNOSIS — Z951 Presence of aortocoronary bypass graft: Secondary | ICD-10-CM

## 2020-12-06 DIAGNOSIS — I252 Old myocardial infarction: Secondary | ICD-10-CM | POA: Diagnosis not present

## 2020-12-06 DIAGNOSIS — Z9861 Coronary angioplasty status: Secondary | ICD-10-CM | POA: Diagnosis not present

## 2020-12-06 DIAGNOSIS — I213 ST elevation (STEMI) myocardial infarction of unspecified site: Secondary | ICD-10-CM

## 2020-12-06 NOTE — Progress Notes (Signed)
Cardiac Individual Treatment Plan  Patient Details  Name: RHONDA VANGIESON MRN: [id number] Date of Birth: Feb 07, 1951 Referring Provider:   Flowsheet Row CARDIAC REHAB PHASE II ORIENTATION from 10/12/2020 in [place]  Referring Provider Sherren Mocha, MD      Initial Encounter Date:  [place] PHASE II ORIENTATION from 10/12/2020 in [place]  Date 10/12/20      Visit Diagnosis: 08/04/20 CABG x6  08/03/20 STEMI  08/03/20 PTCA RCA  Patient's Home Medications on Admission:  Current Outpatient Medications:  .  amiodarone (PACERONE) 200 MG tablet, Take 1 tablet (200 mg total) by mouth daily., Disp: 90 tablet, Rfl: 3 .  aspirin 81 MG EC tablet, Take 81 mg by mouth daily., Disp: , Rfl:  .  atorvastatin (LIPITOR) 80 MG tablet, Take 1 tablet (80 mg total) by mouth daily., Disp: 90 tablet, Rfl: 3 .  benazepril (LOTENSIN) 20 MG tablet, Take 1 tablet (20 mg total) by mouth daily., Disp: 90 tablet, Rfl: 3 .  Cholecalciferol (VITAMIN D3) 1000 UNITS tablet, Take 1,000 Units by mouth daily., Disp: , Rfl:  .  clopidogrel (PLAVIX) 75 MG tablet, Take 1 tablet (75 mg total) by mouth daily., Disp: 90 tablet, Rfl: 3 .  cyanocobalamin 100 MCG tablet, Take 1,000 mcg by mouth daily., Disp: , Rfl:  .  furosemide (LASIX) 20 MG tablet, Take 1 tablet (20 mg total) by mouth as needed for fluid or edema., Disp: 30 tablet, Rfl: 3 .  loratadine (CLARITIN) 10 MG tablet, Take 10 mg by mouth daily as needed for allergies or rhinitis., Disp: , Rfl:  .  meloxicam (MOBIC) 15 MG tablet, Take 1 tablet (15 mg total) by mouth daily as needed. for pain, Disp: 90 tablet, Rfl: 3 .  metFORMIN (GLUCOPHAGE) 500 MG tablet, Take 1 tablet (500 mg total) by mouth daily with breakfast., Disp: 90 tablet, Rfl: 3 .  metoprolol tartrate (LOPRESSOR) 25 MG tablet, Take 0.5 tablets (12.5 mg total) by mouth 2 (two) times daily., Disp: 90 tablet, Rfl: 3 .   potassium chloride SA (KLOR-CON) 20 MEQ tablet, Take 1 tablet (20 mEq total) by mouth daily., Disp: 30 tablet, Rfl: 11 .  tiZANidine (ZANAFLEX) 4 MG tablet, TAKE 1 TABLET BY MOUTH  EVERY 8 HOURS AS NEEDED FOR MUSCLE SPASM(S), Disp: 270 tablet, Rfl: 0 .  traMADol (ULTRAM) 50 MG tablet, TAKE 1 TO 2 TABLETS BY MOUTH TWICE DAILY AS NEEDED, Disp: 100 tablet, Rfl: 3 .  traZODone (DESYREL) 50 MG tablet, TAKE 1/2 TO 1 TABLETS BY  MOUTH AT BEDTIME AS NEEDED  FOR SLEEP., Disp: 90 tablet, Rfl: 3  Past Medical History: Past Medical History:  Diagnosis Date  . Allergy   . CAD (coronary artery disease)    s/p Inf STEMI 10/21>>POBA to RCA >> s/p CABG (L-LAD, S-PDA, L Radial-OM1/RI, RIMA-RPLA) // post op AF; SVT >> Amiod Rx // Echo 10/21: EF 55-60, no RWMA, GLS -18.5, normal RVSF, mild LAE, trivial MR   . Carotid artery disease ([place])    [place] 10/21: R 1-39; L 40-59  . Hyperactive gag reflex   . Hypertension    under control with meds., has been on med. x 30 yr.  . Inguinal hernia 01/2014   bilateral  . LBP (low back pain)   . Non-insulin dependent type 2 diabetes mellitus ([place])   . OA (osteoarthritis) of knee 01/2004   left  . Palpitations   . s/p Inf STEMI  07/2020   POBA to RCA >> CABG  . Vitamin B 12 deficiency     Tobacco Use: Social History   Tobacco Use  Smoking Status Former Smoker  . Years: 22.00  . Types: Cigars  . Quit date: 08/03/2020  . Years since quitting: 0.3  Smokeless Tobacco Never Used  Tobacco Comment   2-3 small cigars/day    Labs: Recent Review Flowsheet Data    Labs for ITP Cardiac and Pulmonary Rehab Latest Ref Rng & Units 08/04/2020 08/04/2020 08/04/2020 08/04/2020 10/19/2020   Cholestrol 100 - 199 mg/dL - - - - 104   LDLCALC 0 - 99 mg/dL - - - - 45   LDLDIRECT mg/dL - - - - -   HDL >39 mg/dL - - - - 45   Trlycerides 0 - 149 mg/dL - - - - 65   Hemoglobin A1c 4.8 - 5.6 % - - - - -   PHART 7.350 - 7.450 7.341(L) 7.345(L) 7.307(L) 7.323(L) -   PCO2ART 32.0 - 48.0  mmHg 41.2 38.0 41.3 41.1 -   HCO3 20.0 - 28.0 mmol/L 22.6 20.8 20.6 21.3 -   TCO2 22 - 32 mmol/L 24 22 22 22  -   ACIDBASEDEF 0.0 - 2.0 mmol/L 3.0(H) 5.0(H) 5.0(H) 4.0(H) -   O2SAT % 99.0 99.0 99.0 99.0 -      Capillary Blood Glucose: Lab Results  Component Value Date   GLUCAP 120 (H) 10/20/2020   GLUCAP 75 10/18/2020   GLUCAP 85 10/18/2020   GLUCAP 91 08/09/2020   GLUCAP 165 (H) 08/08/2020     Exercise Target Goals: Exercise Program Goal: Individual exercise prescription set using results from initial 6 min walk test and THRR while considering  patient's activity barriers and safety.   Exercise Prescription Goal: Initial exercise prescription builds to 30-45 minutes a day of aerobic activity, 2-3 days per week.  Home exercise guidelines will be given to patient during program as part of exercise prescription that the participant will acknowledge.  Activity Barriers & Risk Stratification:  Activity Barriers & Cardiac Risk Stratification - 10/12/20 1034      Activity Barriers & Cardiac Risk Stratification   Activity Barriers Left Knee Replacement    Cardiac Risk Stratification High           6 Minute Walk:  6 Minute Walk    Row Name 10/12/20 1047 12/06/20 1050       6 Minute Walk   Phase Initial Discharge    Distance 1626 feet 1993 feet    Distance % Change - 22.57 %    Distance Feet Change - 367 ft    Walk Time 6 minutes 6 minutes    # of Rest Breaks 0 0    MPH 3.08 3.77    METS 3.49 4.7    RPE 9 11    Perceived Dyspnea  0 0    VO2 Peak 12.23 16.44    Symptoms No No    Resting HR 63 bpm 65 bpm    Resting BP 122/72 116/62    Resting Oxygen Saturation  100 % -    Exercise Oxygen Saturation  during 6 min walk 98 % -    Max Ex. HR 74 bpm 107 bpm    Max Ex. BP 142/82 172/76    2 Minute Post BP 140/80 110/80           Oxygen Initial Assessment:   Oxygen Re-Evaluation:   Oxygen Discharge (Final Oxygen Re-Evaluation):  Initial Exercise  Prescription:  Initial Exercise Prescription - 10/12/20 1300      Date of Initial Exercise RX and Referring Provider   Date 10/12/20    Referring Provider Sherren Mocha, MD    Expected Discharge Date 12/10/20      Treadmill   MPH 2.8    Grade 0    Minutes 15    METs 3.14      NuStep   Level 3    SPM 85    Minutes 15    METs 3      Prescription Details   Frequency (times per week) 3    Duration Progress to 30 minutes of continuous aerobic without signs/symptoms of physical distress      Intensity   THRR 40-80% of Max Heartrate 61-122    Ratings of Perceived Exertion 11-13    Perceived Dyspnea 0-4      Progression   Progression Continue to progress workloads to maintain intensity without signs/symptoms of physical distress.      Resistance Training   Training Prescription Yes    Weight 3 lbs    Reps 10-15           Perform Capillary Blood Glucose checks as needed.  Exercise Prescription Changes:   Exercise Prescription Changes    Row Name 10/18/20 1052 11/01/20 1123 11/15/20 1055 11/29/20 1055       Response to Exercise   Blood Pressure (Admit) 128/66 134/72 138/76 130/76    Blood Pressure (Exercise) 148/88 142/70 162/80 162/72    Blood Pressure (Exit) 118/68 120/72 114/70 132/70    Heart Rate (Admit) 70 bpm 60 bpm 65 bpm 74 bpm    Heart Rate (Exercise) 100 bpm 89 bpm 95 bpm 103 bpm    Heart Rate (Exit) 66 bpm 64 bpm 70 bpm 76 bpm    Rating of Perceived Exertion (Exercise) 13 11 11 12     Symptoms none none none none    Comments Off to a good start with exercise. - - -    Duration Continue with 30 min of aerobic exercise without signs/symptoms of physical distress. Continue with 30 min of aerobic exercise without signs/symptoms of physical distress. Continue with 30 min of aerobic exercise without signs/symptoms of physical distress. Continue with 30 min of aerobic exercise without signs/symptoms of physical distress.    Intensity THRR unchanged THRR  unchanged THRR unchanged THRR unchanged         Progression   Progression Continue to progress workloads to maintain intensity without signs/symptoms of physical distress. Continue to progress workloads to maintain intensity without signs/symptoms of physical distress. Continue to progress workloads to maintain intensity without signs/symptoms of physical distress. Continue to progress workloads to maintain intensity without signs/symptoms of physical distress.    Average METs 3 3.3 3.9 3.9         Resistance Training   Training Prescription Yes Yes Yes Yes    Weight 3 lbs 3 lbs 4 lbs 5 lbs    Reps 10-15 10-15 10-15 10-15    Time 10 Minutes 10 Minutes 10 Minutes 10 Minutes         Interval Training   Interval Training No No No No         NuStep   Level 3 3 4 5     SPM 85 85 85 85    Minutes 17 17 17 17     METs 2.7 3.1 4.1 4.2         Track  Laps 20 21 23 23     Minutes 15 15 15 15     METs 3.32 3.44 3.67 3.67         Home Exercise Plan   Plans to continue exercise at - Home (comment)  Walking Home (comment)  Walking Home (comment)  Walking    Frequency - Add 2 additional days to program exercise sessions. Add 2 additional days to program exercise sessions. Add 2 additional days to program exercise sessions.    Initial Home Exercises Provided - 10/29/20 10/29/20 10/29/20           Exercise Comments:   Exercise Comments    Row Name 10/18/20 1158 10/29/20 1051 11/03/20 1140 11/15/20 1058 12/01/20 1054   Exercise Comments Patient tolerated 1st exercise session well without symptoms. Switched from treadmill to track per patient preference. Reviewed home exercise guidelines and goals with patient. Reviewed METs with patient. Reviewed METs and goals with patient. Reviewed METs with patient.          Exercise Goals and Review:   Exercise Goals    Row Name 10/12/20 1034             Exercise Goals   Increase Physical Activity Yes       Intervention Provide advice,  education, support and counseling about physical activity/exercise needs.;Develop an individualized exercise prescription for aerobic and resistive training based on initial evaluation findings, risk stratification, comorbidities and participant's personal goals.       Expected Outcomes Short Term: Attend rehab on a regular basis to increase amount of physical activity.;Long Term: Exercising regularly at least 3-5 days a week.;Long Term: Add in home exercise to make exercise part of routine and to increase amount of physical activity.       Increase Strength and Stamina Yes       Intervention Provide advice, education, support and counseling about physical activity/exercise needs.;Develop an individualized exercise prescription for aerobic and resistive training based on initial evaluation findings, risk stratification, comorbidities and participant's personal goals.       Expected Outcomes Short Term: Increase workloads from initial exercise prescription for resistance, speed, and METs.;Short Term: Perform resistance training exercises routinely during rehab and add in resistance training at home;Long Term: Improve cardiorespiratory fitness, muscular endurance and strength as measured by increased METs and functional capacity (6MWT)       Able to understand and use rate of perceived exertion (RPE) scale Yes       Intervention Provide education and explanation on how to use RPE scale       Expected Outcomes Short Term: Able to use RPE daily in rehab to express subjective intensity level;Long Term:  Able to use RPE to guide intensity level when exercising independently       Knowledge and understanding of Target Heart Rate Range (THRR) Yes       Intervention Provide education and explanation of THRR including how the numbers were predicted and where they are located for reference       Expected Outcomes Short Term: Able to state/look up THRR;Long Term: Able to use THRR to govern intensity when exercising  independently;Short Term: Able to use daily as guideline for intensity in rehab       Able to check pulse independently Yes       Intervention Provide education and demonstration on how to check pulse in carotid and radial arteries.;Review the importance of being able to check your own pulse for safety during independent exercise  Expected Outcomes Short Term: Able to explain why pulse checking is important during independent exercise;Long Term: Able to check pulse independently and accurately       Understanding of Exercise Prescription Yes       Intervention Provide education, explanation, and written materials on patient's individual exercise prescription       Expected Outcomes Short Term: Able to explain program exercise prescription;Long Term: Able to explain home exercise prescription to exercise independently              Exercise Goals Re-Evaluation :  Exercise Goals Re-Evaluation    [place] Name 10/18/20 1158 10/29/20 1051 11/15/20 1058 12/06/20 1100       Exercise Goal Re-Evaluation   Exercise Goals Review Increase Physical Activity;Able to understand and use rate of perceived exertion (RPE) scale Increase Physical Activity;Able to understand and use rate of perceived exertion (RPE) scale;Increase Strength and Stamina;Understanding of Exercise Prescription;Knowledge and understanding of Target Heart Rate Range (THRR) Increase Physical Activity;Able to understand and use rate of perceived exertion (RPE) scale;Increase Strength and Stamina;Understanding of Exercise Prescription;Knowledge and understanding of Target Heart Rate Range (THRR) Increase Physical Activity;Able to understand and use rate of perceived exertion (RPE) scale;Increase Strength and Stamina;Understanding of Exercise Prescription;Knowledge and understanding of Target Heart Rate Range (THRR)    Comments Patient able to understand and use RPE scale appropriately. Reviewed home exercise guidelines with patient including  endpoints, temperature precautions, target heart rate and rate of perceived exertion. Patient plans to walk as his mode of home exercise. Patient has 5 lb weights that he can use for his resistance exercise once he works up to that weight. Patient's goal is to lose weight and increase muscle tone. Patient is walking 30 minutes, at least 5 days/week at home. Patient scheduled to graduate this week and has progressed well. Functional capacity increased 22% as measured by 6MWT, strength increased 10% as measured by grip strength test. Patient plans to continue walking at home as his mode of exercise.    Expected Outcomes Progress workloads as tolerated to help improve cardiorespiratory fitness. Patient will add 1-2 days of walking 30 minutes at home in addition to exercise at cardiac rehab. Patient will continue walking routine at home in addition to exercise at cardiac rehab to help achieve personal health and fitness goals. Patient will walk at least 30 minutes 5-7 days/week to maintain health and fitness benefits.           Discharge Exercise Prescription (Final Exercise Prescription Changes):  Exercise Prescription Changes - 11/29/20 1055      Response to Exercise   Blood Pressure (Admit) 130/76    Blood Pressure (Exercise) 162/72    Blood Pressure (Exit) 132/70    Heart Rate (Admit) 74 bpm    Heart Rate (Exercise) 103 bpm    Heart Rate (Exit) 76 bpm    Rating of Perceived Exertion (Exercise) 12    Symptoms none    Duration Continue with 30 min of aerobic exercise without signs/symptoms of physical distress.    Intensity THRR unchanged      Progression   Progression Continue to progress workloads to maintain intensity without signs/symptoms of physical distress.    Average METs 3.9      Resistance Training   Training Prescription Yes    Weight 5 lbs    Reps 10-15    Time 10 Minutes      Interval Training   Interval Training No      NuStep   Level  5    SPM 85    Minutes 17     METs 4.2      Track   Laps 23    Minutes 15    METs 3.67      Home Exercise Plan   Plans to continue exercise at Home (comment)   Walking   Frequency Add 2 additional days to program exercise sessions.    Initial Home Exercises Provided 10/29/20           Nutrition:  Target Goals: Understanding of nutrition guidelines, daily intake of sodium 1500mg , cholesterol 200mg , calories 30% from fat and 7% or less from saturated fats, daily to have 5 or more servings of fruits and vegetables.  Biometrics:  Pre Biometrics - 10/12/20 1036      Pre Biometrics   Waist Circumference 38 inches    Hip Circumference 41.5 inches    Waist to Hip Ratio 0.92 %    Triceps Skinfold 12 mm    % Body Fat 25 %    Grip Strength 41.5 kg    Flexibility 19.13 in    Single Leg Stand 5.62 seconds           Post Biometrics - 12/06/20 1100       Post  Biometrics   Waist Circumference 38.25 inches    Hip Circumference 40.5 inches    Waist to Hip Ratio 0.94 %    Triceps Skinfold 16 mm    Grip Strength 45.5 kg    Flexibility 19.25 in    Single Leg Stand 4.81 seconds           Nutrition Therapy Plan and Nutrition Goals:  Nutrition Therapy & Goals - 11/15/20 1037      Nutrition Therapy   Diet TLC      Personal Nutrition Goals   Nutrition Goal Pt to build a healthy plate including vegetables, fruits, whole grains, and low-fat dairy products in a heart healthy meal plan.    Personal Goal #2 Pt to reduce sugary beverages to <2 per day      Intervention Plan   Intervention Prescribe, educate and counsel regarding individualized specific dietary modifications aiming towards targeted core components such as weight, hypertension, lipid management, diabetes, heart failure and other comorbidities.;Nutrition handout(s) given to patient.    Expected Outcomes Short Term Goal: Understand basic principles of dietary content, such as calories, fat, sodium, cholesterol and nutrients.           Nutrition  Assessments:  MEDIFICTS Score Key:  ?70 Need to make dietary changes   40-70 Heart Healthy Diet  ? 40 Therapeutic Level Cholesterol Diet   Flowsheet Row CARDIAC REHAB PHASE II EXERCISE from 10/20/2020 in [place]  Picture Your Plate Total Score on Admission 50     Picture Your Plate Scores:  <20 Unhealthy dietary pattern with much room for improvement.  41-50 Dietary pattern unlikely to meet recommendations for good health and room for improvement.  51-60 More healthful dietary pattern, with some room for improvement.   >60 Healthy dietary pattern, although there may be some specific behaviors that could be improved.    Nutrition Goals Re-Evaluation:  Nutrition Goals Re-Evaluation    [place] Name 11/15/20 1038 12/03/20 1201           Goals   Current Weight 226 lb 13.7 oz (102.9 kg) 226 lb 6.6 oz (102.7 kg)      Nutrition Goal Pt to build a healthy plate including  vegetables, fruits, whole grains, and low-fat dairy products in a heart healthy meal plan. Pt to build a healthy plate including vegetables, fruits, whole grains, and low-fat dairy products in a heart healthy meal plan.      Comment - Reviewed heart healthy nutrition             Personal Goal #2 Re-Evaluation   Personal Goal #2 Pt to reduce sugary beverages to <2 per day Pt to reduce sugary beverages to <2 per day             Nutrition Goals Re-Evaluation:  Nutrition Goals Re-Evaluation    [place] Name 11/15/20 1038 12/03/20 1201           Goals   Current Weight 226 lb 13.7 oz (102.9 kg) 226 lb 6.6 oz (102.7 kg)      Nutrition Goal Pt to build a healthy plate including vegetables, fruits, whole grains, and low-fat dairy products in a heart healthy meal plan. Pt to build a healthy plate including vegetables, fruits, whole grains, and low-fat dairy products in a heart healthy meal plan.      Comment - Reviewed heart healthy nutrition             Personal Goal #2 Re-Evaluation    Personal Goal #2 Pt to reduce sugary beverages to <2 per day Pt to reduce sugary beverages to <2 per day             Nutrition Goals Discharge (Final Nutrition Goals Re-Evaluation):  Nutrition Goals Re-Evaluation - 12/03/20 1201      Goals   Current Weight 226 lb 6.6 oz (102.7 kg)    Nutrition Goal Pt to build a healthy plate including vegetables, fruits, whole grains, and low-fat dairy products in a heart healthy meal plan.    Comment Reviewed heart healthy nutrition      Personal Goal #2 Re-Evaluation   Personal Goal #2 Pt to reduce sugary beverages to <2 per day           Psychosocial: Target Goals: Acknowledge presence or absence of significant depression and/or stress, maximize coping skills, provide positive support system. Participant is able to verbalize types and ability to use techniques and skills needed for reducing stress and depression.  Initial Review & Psychosocial Screening:  Initial Psych Review & Screening - 12/07/20 0805      Initial Review   Current issues with None Identified      Family Dynamics   Good Support System? Yes   Jerardo has his wife for support     Barriers   Psychosocial barriers to participate in program There are no identifiable barriers or psychosocial needs.      Screening Interventions   Interventions Encouraged to exercise           Quality of Life Scores:  Quality of Life - 10/12/20 1349      Quality of Life   Select Quality of Life      Quality of Life Scores   Health/Function Pre 28 %    Socioeconomic Pre 28.29 %    Psych/Spiritual Pre 30 %    Family Pre 28.8 %    GLOBAL Pre 28.59 %          Scores of 19 and below usually indicate a poorer quality of life in these areas.  A difference of  2-3 points is a clinically meaningful difference.  A difference of 2-3 points in the total score of the Quality of Life  Index has been associated with significant improvement in overall quality of life, self-image, physical  symptoms, and general health in studies assessing change in quality of life.  PHQ-9: Recent Review Flowsheet Data    Depression screen [facility] 2/9 10/12/2020 07/30/2020 12/22/2019 12/20/2018 04/09/2017   Decreased Interest 0 0 0 0 0   Down, Depressed, Hopeless 0 0 0 0 0   PHQ - 2 Score 0 0 0 0 0     Interpretation of Total Score  Total Score Depression Severity:  1-4 = Minimal depression, 5-9 = Mild depression, 10-14 = Moderate depression, 15-19 = Moderately severe depression, 20-27 = Severe depression   Psychosocial Evaluation and Intervention:   Psychosocial Re-Evaluation:  Psychosocial Re-Evaluation    Row Name 10/19/20 0811 11/11/20 0820           Psychosocial Re-Evaluation   Current issues with None Identified None Identified      Interventions Encouraged to attend Cardiac Rehabilitation for the exercise Encouraged to attend Cardiac Rehabilitation for the exercise      Continue Psychosocial Services  No Follow up required No Follow up required             Psychosocial Discharge (Final Psychosocial Re-Evaluation):  Psychosocial Re-Evaluation - 11/11/20 0820      Psychosocial Re-Evaluation   Current issues with None Identified    Interventions Encouraged to attend Cardiac Rehabilitation for the exercise    Continue Psychosocial Services  No Follow up required           Vocational Rehabilitation: Provide vocational rehab assistance to qualifying candidates.   Vocational Rehab Evaluation & Intervention:  Vocational Rehab - 10/13/20 1148      Initial Vocational Rehab Evaluation & Intervention   Assessment shows need for Vocational Rehabilitation No   Jarl is retired/ disabled and does not need vocational rehab at this time          Education: Education Goals: Education classes will be provided on a weekly basis, covering required topics. Participant will state understanding/return demonstration of topics presented.  Learning Barriers/Preferences:  Learning  Barriers/Preferences - 10/13/20 1134      Learning Barriers/Preferences   Learning Barriers Sight   Reading glasses   Learning Preferences Group Instruction;Verbal Instruction;Skilled Demonstration           Education Topics: Count Your Pulse:  -Group instruction provided by verbal instruction, demonstration, patient participation and written materials to support subject.  Instructors address importance of being able to find your pulse and how to count your pulse when at home without a heart monitor.  Patients get hands on experience counting their pulse with staff help and individually.   Heart Attack, Angina, and Risk Factor Modification:  -Group instruction provided by verbal instruction, video, and written materials to support subject.  Instructors address signs and symptoms of angina and heart attacks.    Also discuss risk factors for heart disease and how to make changes to improve heart health risk factors.   Functional Fitness:  -Group instruction provided by verbal instruction, demonstration, patient participation, and written materials to support subject.  Instructors address safety measures for doing things around the house.  Discuss how to get up and down off the floor, how to pick things up properly, how to safely get out of a chair without assistance, and balance training.   Meditation and Mindfulness:  -Group instruction provided by verbal instruction, patient participation, and written materials to support subject.  Instructor addresses importance of mindfulness and meditation practice to  help reduce stress and improve awareness.  Instructor also leads participants through a meditation exercise.    Stretching for Flexibility and Mobility:  -Group instruction provided by verbal instruction, patient participation, and written materials to support subject.  Instructors lead participants through series of stretches that are designed to increase flexibility thus improving  mobility.  These stretches are additional exercise for major muscle groups that are typically performed during regular warm up and cool down.   Hands Only CPR:  -Group verbal, video, and participation provides a basic overview of AHA guidelines for community CPR. Role-play of emergencies allow participants the opportunity to practice calling for help and chest compression technique with discussion of AED use.   Hypertension: -Group verbal and written instruction that provides a basic overview of hypertension including the most recent diagnostic guidelines, risk factor reduction with self-care instructions and medication management.    Nutrition I class: Heart Healthy Eating:  -Group instruction provided by PowerPoint slides, verbal discussion, and written materials to support subject matter. The instructor gives an explanation and review of the Therapeutic Lifestyle Changes diet recommendations, which includes a discussion on lipid goals, dietary fat, sodium, fiber, plant stanol/sterol esters, sugar, and the components of a well-balanced, healthy diet.   Nutrition II class: Lifestyle Skills:  -Group instruction provided by PowerPoint slides, verbal discussion, and written materials to support subject matter. The instructor gives an explanation and review of label reading, grocery shopping for heart health, heart healthy recipe modifications, and ways to make healthier choices when eating out.   Diabetes Question & Answer:  -Group instruction provided by PowerPoint slides, verbal discussion, and written materials to support subject matter. The instructor gives an explanation and review of diabetes co-morbidities, pre- and post-prandial blood glucose goals, pre-exercise blood glucose goals, signs, symptoms, and treatment of hypoglycemia and hyperglycemia, and foot care basics.   Diabetes Blitz:  -Group instruction provided by PowerPoint slides, verbal discussion, and written materials to  support subject matter. The instructor gives an explanation and review of the physiology behind type 1 and type 2 diabetes, diabetes medications and rational behind using different medications, pre- and post-prandial blood glucose recommendations and Hemoglobin A1c goals, diabetes diet, and exercise including blood glucose guidelines for exercising safely.    Portion Distortion:  -Group instruction provided by PowerPoint slides, verbal discussion, written materials, and food models to support subject matter. The instructor gives an explanation of serving size versus portion size, changes in portions sizes over the last 20 years, and what consists of a serving from each food group.   Stress Management:  -Group instruction provided by verbal instruction, video, and written materials to support subject matter.  Instructors review role of stress in heart disease and how to cope with stress positively.     Exercising on Your Own:  -Group instruction provided by verbal instruction, power point, and written materials to support subject.  Instructors discuss benefits of exercise, components of exercise, frequency and intensity of exercise, and end points for exercise.  Also discuss use of nitroglycerin and activating EMS.  Review options of places to exercise outside of rehab.  Review guidelines for sex with heart disease.   Cardiac Drugs I:  -Group instruction provided by verbal instruction and written materials to support subject.  Instructor reviews cardiac drug classes: antiplatelets, anticoagulants, beta blockers, and statins.  Instructor discusses reasons, side effects, and lifestyle considerations for each drug class.   Cardiac Drugs II:  -Group instruction provided by verbal instruction and written materials to support  subject.  Instructor reviews cardiac drug classes: angiotensin converting enzyme inhibitors (ACE-I), angiotensin II receptor blockers (ARBs), nitrates, and calcium channel  blockers.  Instructor discusses reasons, side effects, and lifestyle considerations for each drug class.   Anatomy and Physiology of the Circulatory System:  Group verbal and written instruction and models provide basic cardiac anatomy and physiology, with the coronary electrical and arterial systems. Review of: AMI, Angina, Valve disease, Heart Failure, Peripheral Artery Disease, Cardiac Arrhythmia, Pacemakers, and the ICD.   Other Education:  -Group or individual verbal, written, or video instructions that support the educational goals of the cardiac rehab program.   Holiday Eating Survival Tips:  -Group instruction provided by PowerPoint slides, verbal discussion, and written materials to support subject matter. The instructor gives patients tips, tricks, and techniques to help them not only survive but enjoy the holidays despite the onslaught of food that accompanies the holidays.   Knowledge Questionnaire Score:  Knowledge Questionnaire Score - 10/12/20 1350      Knowledge Questionnaire Score   Pre Score 22/24           Core Components/Risk Factors/Patient Goals at Admission:  Personal Goals and Risk Factors at Admission - 10/13/20 1149      Core Components/Risk Factors/Patient Goals on Admission    Weight Management Yes;Weight Maintenance;Weight Loss    Intervention Weight Management: Develop a combined nutrition and exercise program designed to reach desired caloric intake, while maintaining appropriate intake of nutrient and fiber, sodium and fats, and appropriate energy expenditure required for the weight goal.;Weight Management: Provide education and appropriate resources to help participant work on and attain dietary goals.;Weight Management/Obesity: Establish reasonable short term and long term weight goals.    Expected Outcomes Short Term: Continue to assess and modify interventions until short term weight is achieved;Long Term: Adherence to nutrition and physical  activity/exercise program aimed toward attainment of established weight goal;Weight Loss: Understanding of general recommendations for a balanced deficit meal plan, which promotes 1-2 lb weight loss per week and includes a negative energy balance of [id number] kcal/d;Understanding recommendations for meals to include 15-35% energy as protein, 25-35% energy from fat, 35-60% energy from carbohydrates, less than 200mg  of dietary cholesterol, 20-35 gm of total fiber daily;Understanding of distribution of calorie intake throughout the day with the consumption of 4-5 meals/snacks    Tobacco Cessation Yes    Number of packs per day Edinson quit smoking in October    Intervention Assist the participant in steps to quit. Provide individualized education and counseling about committing to Tobacco Cessation, relapse prevention, and pharmacological support that can be provided by physician.    Expected Outcomes Long Term: Complete abstinence from all tobacco products for at least 12 months from quit date.    Diabetes Yes    Intervention Provide education about signs/symptoms and action to take for hypo/hyperglycemia.;Provide education about proper nutrition, including hydration, and aerobic/resistive exercise prescription along with prescribed medications to achieve blood glucose in normal ranges: Fasting glucose 65-99 mg/dL    Expected Outcomes Short Term: Participant verbalizes understanding of the signs/symptoms and immediate care of hyper/hypoglycemia, proper foot care and importance of medication, aerobic/resistive exercise and nutrition plan for blood glucose control.;Long Term: Attainment of HbA1C < 7%.    Hypertension Yes    Intervention Provide education on lifestyle modifcations including regular physical activity/exercise, weight management, moderate sodium restriction and increased consumption of fresh fruit, vegetables, and low fat dairy, alcohol moderation, and smoking cessation.;Monitor prescription use  compliance.    Expected Outcomes  Short Term: Continued assessment and intervention until BP is < 140/2mm HG in hypertensive participants. < 130/56mm HG in hypertensive participants with diabetes, heart failure or chronic kidney disease.;Long Term: Maintenance of blood pressure at goal levels.    Lipids Yes    Intervention Provide education and support for participant on nutrition & aerobic/resistive exercise along with prescribed medications to achieve LDL 70mg , HDL >40mg .    Expected Outcomes Short Term: Participant states understanding of desired cholesterol values and is compliant with medications prescribed. Participant is following exercise prescription and nutrition guidelines.;Long Term: Cholesterol controlled with medications as prescribed, with individualized exercise RX and with personalized nutrition plan. Value goals: LDL < 70mg , HDL > 40 mg.           Core Components/Risk Factors/Patient Goals Review:   Goals and Risk Factor Review    Row Name 10/19/20 6803 11/11/20 2122 12/07/20 0805         Core Components/Risk Factors/Patient Goals Review   Personal Goals Review Weight Management/Obesity;Lipids;Hypertension;Diabetes;Tobacco Cessation Weight Management/Obesity;Hypertension;Lipids;Diabetes Weight Management/Obesity;Hypertension;Lipids;Diabetes     Review Akiem started exercise on 10/18/20. Patient did well with exercise. Vital signs stable. CBG WNL. Dahmir does not check his CBG's at home Lander continues to do well with exercise. Balthazar's vital signs and weights have been stable. Ameya continues to do well with exercise. Jareth's vital signs and weights have been stable. Vincente will complete cardiac rehab on 12/10/20     Expected Outcomes Ottis will continue to participate in phase 2 cardiac rehab for exercise nutrition and lifestyle modifications Clanton will continue to participate in phase 2 cardiac rehab for exercise, nutrtion and lifestyle modifications Coyle will  continue to participate in phase 2 cardiac rehab for exercise, nutrtion and lifestyle modifications and continue exercise post completion of the program            Core Components/Risk Factors/Patient Goals at Discharge (Final Review):   Goals and Risk Factor Review - 12/07/20 0805      Core Components/Risk Factors/Patient Goals Review   Personal Goals Review Weight Management/Obesity;Hypertension;Lipids;Diabetes    Review Hezekiah continues to do well with exercise. Sammuel's vital signs and weights have been stable. Loran will complete cardiac rehab on 12/10/20    Expected Outcomes Mosie will continue to participate in phase 2 cardiac rehab for exercise, nutrtion and lifestyle modifications and continue exercise post completion of the program           ITP Comments:  ITP Comments    Row Name 10/12/20 1036 10/19/20 0809 11/11/20 0819 12/07/20 0804     ITP Comments Medical Director- Dr. Fransico Him, MD 30 Day ITP review. Valdez started exercise on 10/19/20 and did well. VSS. 30 Day ITP Review. Sylis has good attendnace and participation in phase 2 cardiac rehab 30 Day ITP Review. Dquan has good attendnace and participation in phase 2 cardiac rehab. Emrah will complete cardiac rehab on 12/10/20           Comments: See ITP comments.

## 2020-12-07 ENCOUNTER — Other Ambulatory Visit: Payer: Self-pay | Admitting: Physician Assistant

## 2020-12-08 ENCOUNTER — Other Ambulatory Visit: Payer: Self-pay

## 2020-12-08 ENCOUNTER — Encounter (HOSPITAL_COMMUNITY)
Admission: RE | Admit: 2020-12-08 | Discharge: 2020-12-08 | Disposition: A | Discharge status: Home / Self Care | Payer: Medicare Other | Source: Ambulatory Visit | Attending: Cardiovascular Disease | Admitting: Cardiovascular Disease

## 2020-12-08 DIAGNOSIS — I213 ST elevation (STEMI) myocardial infarction of unspecified site: Secondary | ICD-10-CM | POA: Insufficient documentation

## 2020-12-08 DIAGNOSIS — Z9861 Coronary angioplasty status: Secondary | ICD-10-CM | POA: Insufficient documentation

## 2020-12-08 DIAGNOSIS — Z951 Presence of aortocoronary bypass graft: Secondary | ICD-10-CM | POA: Diagnosis not present

## 2020-12-10 ENCOUNTER — Encounter (HOSPITAL_COMMUNITY)
Admission: RE | Admit: 2020-12-10 | Discharge: 2020-12-10 | Disposition: A | Discharge status: Home / Self Care | Payer: Medicare Other | Source: Ambulatory Visit | Attending: Cardiovascular Disease | Admitting: Cardiovascular Disease

## 2020-12-10 ENCOUNTER — Other Ambulatory Visit: Payer: Self-pay

## 2020-12-10 VITALS — BP 128/62 | HR 62 | Ht 75.5 in | Wt 228.2 lb

## 2020-12-10 DIAGNOSIS — Z9861 Coronary angioplasty status: Secondary | ICD-10-CM

## 2020-12-10 DIAGNOSIS — Z951 Presence of aortocoronary bypass graft: Secondary | ICD-10-CM

## 2020-12-10 DIAGNOSIS — I213 ST elevation (STEMI) myocardial infarction of unspecified site: Secondary | ICD-10-CM | POA: Diagnosis not present

## 2020-12-10 NOTE — Progress Notes (Signed)
Discharge Progress Report  Patient Details  Name: Brian Mccullough MRN: [id number] Date of Birth: 06-Jun-1951 Referring Provider:   Flowsheet Row CARDIAC REHAB PHASE II ORIENTATION from 10/12/2020 in [facility]  Referring Provider Sherren Mocha, MD        Number of Visits: 22  Reason for Discharge:  Patient reached a stable level of exercise. Patient independent in their exercise. Patient has met program and personal goals.  Smoking History:  Social History   Tobacco Use  Smoking Status Former Smoker  . Years: 22.00  . Types: Cigars  . Quit date: 08/03/2020  . 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  08/03/20 STEMI  08/03/20 PTCA RCA  ADL [facility]:    Initial Exercise Prescription:  Initial Exercise Prescription - 10/12/20 1300       Date of Initial Exercise RX and Referring Provider   Date 10/12/20    Referring Provider Sherren Mocha, MD    Expected Discharge Date 12/10/20      Treadmill   MPH 2.8    Grade 0    Minutes 15    METs 3.14      NuStep   Level 3    SPM 85    Minutes 15    METs 3      Prescription Details   Frequency (times per week) 3    Duration Progress to 30 minutes of continuous aerobic without signs/symptoms of physical distress      Intensity   THRR 40-80% of Max Heartrate 61-122    Ratings of Perceived Exertion 11-13    Perceived Dyspnea 0-4      Progression   Progression Continue to progress workloads to maintain intensity without signs/symptoms of physical distress.      Resistance Training   Training Prescription Yes    Weight 3 lbs    Reps 10-15             Discharge Exercise Prescription (Final Exercise Prescription Changes):  Exercise Prescription Changes - 11/29/20 1055       Response to Exercise   Blood Pressure (Admit) 130/76    Blood Pressure (Exercise) 162/72    Blood Pressure  (Exit) 132/70    Heart Rate (Admit) 74 bpm    Heart Rate (Exercise) 103 bpm    Heart Rate (Exit) 76 bpm    Rating of Perceived Exertion (Exercise) 12    Symptoms none    Duration Continue with 30 min of aerobic exercise without signs/symptoms of physical distress.    Intensity THRR unchanged      Progression   Progression Continue to progress workloads to maintain intensity without signs/symptoms of physical distress.    Average METs 3.9      Resistance Training   Training Prescription Yes    Weight 5 lbs    Reps 10-15    Time 10 Minutes      Interval Training   Interval Training No      NuStep   Level 5    SPM 85    Minutes 17    METs 4.2      Track   Laps 23    Minutes 15    METs 3.67      Home Exercise Plan   Plans to continue exercise at Home (comment)   Walking   Frequency Add 2 additional days to program exercise  sessions.    Initial Home Exercises Provided 10/29/20             Functional Capacity:  6 Minute Walk     Row Name 10/12/20 1047 12/06/20 1050       6 Minute Walk   Phase Initial Discharge    Distance 1626 feet 1993 feet    Distance % Change -- 22.57 %    Distance Feet Change -- 367 ft    Walk Time 6 minutes 6 minutes    # of Rest Breaks 0 0    MPH 3.08 3.77    METS 3.49 4.7    RPE 9 11    Perceived Dyspnea  0 0    VO2 Peak 12.23 16.44    Symptoms No No    Resting HR 63 bpm 65 bpm    Resting BP 122/72 116/62    Resting Oxygen Saturation  100 % --    Exercise Oxygen Saturation  during 6 min walk 98 % --    Max Ex. HR 74 bpm 107 bpm    Max Ex. BP 142/82 172/76    2 Minute Post BP 140/80 110/80             Psychological, QOL, Others - Outcomes: PHQ 2/9: Depression screen [facility] 2/9 12/10/2020 10/12/2020 07/30/2020 12/22/2019 12/20/2018  Decreased Interest 0 0 0 0 0  Down, Depressed, Hopeless 0 0 0 0 0  PHQ - 2 Score 0 0 0 0 0  Some recent data might be hidden    Quality of Life:  Quality of Life - 12/10/20 0827       Quality  of Life   Select Quality of Life      Quality of Life Scores   Health/Function Pre 28 %    Health/Function Post 29.5 %    Health/Function % Change 5.36 %    Socioeconomic Pre 28.29 %    Socioeconomic Post 30 %    Socioeconomic % Change  6.04 %    Psych/Spiritual Pre 30 %    Psych/Spiritual Post 30 %    Psych/Spiritual % Change 0 %    Family Pre 28.8 %    Family Post 30 %    Family % Change 4.17 %    GLOBAL Pre 28.59 %    GLOBAL Post 29.78 %    GLOBAL % Change 4.16 %             Personal Goals: Goals established at orientation with interventions provided to work toward goal.  Personal Goals and Risk Factors at Admission - 10/13/20 1149       Core Components/Risk Factors/Patient Goals on Admission    Weight Management Yes;Weight Maintenance;Weight Loss    Intervention Weight Management: Develop a combined nutrition and exercise program designed to reach desired caloric intake, while maintaining appropriate intake of nutrient and fiber, sodium and fats, and appropriate energy expenditure required for the weight goal.;Weight Management: Provide education and appropriate resources to help participant work on and attain dietary goals.;Weight Management/Obesity: Establish reasonable short term and long term weight goals.    Expected Outcomes Short Term: Continue to assess and modify interventions until short term weight is achieved;Long Term: Adherence to nutrition and physical activity/exercise program aimed toward attainment of established weight goal;Weight Loss: Understanding of general recommendations for a balanced deficit meal plan, which promotes 1-2 lb weight loss per week and includes a negative energy balance of [phone] kcal/d;Understanding recommendations for meals to include 15-35% energy  as protein, 25-35% energy from fat, 35-60% energy from carbohydrates, less than 22m of dietary cholesterol, 20-35 gm of total fiber daily;Understanding of distribution of calorie intake  throughout the day with the consumption of 4-5 meals/snacks    Tobacco Cessation Yes    Number of packs per day Mister quit smoking in October    Intervention Assist the participant in steps to quit. Provide individualized education and counseling about committing to Tobacco Cessation, relapse prevention, and pharmacological support that can be provided by physician.    Expected Outcomes Long Term: Complete abstinence from all tobacco products for at least 12 months from quit date.    Diabetes Yes    Intervention Provide education about signs/symptoms and action to take for hypo/hyperglycemia.;Provide education about proper nutrition, including hydration, and aerobic/resistive exercise prescription along with prescribed medications to achieve blood glucose in normal ranges: Fasting glucose 65-99 mg/dL    Expected Outcomes Short Term: Participant verbalizes understanding of the signs/symptoms and immediate care of hyper/hypoglycemia, proper foot care and importance of medication, aerobic/resistive exercise and nutrition plan for blood glucose control.;Long Term: Attainment of HbA1C < 7%.    Hypertension Yes    Intervention Provide education on lifestyle modifcations including regular physical activity/exercise, weight management, moderate sodium restriction and increased consumption of fresh fruit, vegetables, and low fat dairy, alcohol moderation, and smoking cessation.;Monitor prescription use compliance.    Expected Outcomes Short Term: Continued assessment and intervention until BP is < 140/961mHG in hypertensive participants. < 130/8033mG in hypertensive participants with diabetes, heart failure or chronic kidney disease.;Long Term: Maintenance of blood pressure at goal levels.    Lipids Yes    Intervention Provide education and support for participant on nutrition & aerobic/resistive exercise along with prescribed medications to achieve LDL <9m41mDL >40mg9m Expected Outcomes Short Term:  Participant states understanding of desired cholesterol values and is compliant with medications prescribed. Participant is following exercise prescription and nutrition guidelines.;Long Term: Cholesterol controlled with medications as prescribed, with individualized exercise RX and with personalized nutrition plan. Value goals: LDL < 9mg,41m > 40 mg.              Personal Goals Discharge:  Goals and Risk Factor Review     Row Name 10/19/20 0812 02637/22 0822 08588/22 0805         Core Components/Risk Factors/Patient Goals Review   Personal Goals Review Weight Management/Obesity;Lipids;Hypertension;Diabetes;Tobacco Cessation Weight Management/Obesity;Hypertension;Lipids;Diabetes Weight Management/Obesity;Hypertension;Lipids;Diabetes     Review Traci started exercise on 10/18/20. Patient did well with exercise. Vital signs stable. CBG WNL. TerrelEvennot check his CBG's at home TerrelCaedannues to do well with exercise. Kendan's vital signs and weights have been stable. TerrelBrannannues to do well with exercise. Travante's vital signs and weights have been stable. TerrelRidercomplete cardiac rehab on 12/10/20     Expected Outcomes TerrelNataliecontinue to participate in phase 2 cardiac rehab for exercise nutrition and lifestyle modifications TerrelRowdycontinue to participate in phase 2 cardiac rehab for exercise, nutrtion and lifestyle modifications TerrelRielcontinue to participate in phase 2 cardiac rehab for exercise, nutrtion and lifestyle modifications and continue exercise post completion of the program              Exercise Goals and Review:  Exercise Goals     Row Name 10/12/20 1034             Exercise Goals   Increase Physical Activity Yes  Intervention Provide advice, education, support and counseling about physical activity/exercise needs.;Develop an individualized exercise prescription for aerobic and resistive training based on initial evaluation  findings, risk stratification, comorbidities and participant's personal goals.       Expected Outcomes Short Term: Attend rehab on a regular basis to increase amount of physical activity.;Long Term: Exercising regularly at least 3-5 days a week.;Long Term: Add in home exercise to make exercise part of routine and to increase amount of physical activity.       Increase Strength and Stamina Yes       Intervention Provide advice, education, support and counseling about physical activity/exercise needs.;Develop an individualized exercise prescription for aerobic and resistive training based on initial evaluation findings, risk stratification, comorbidities and participant's personal goals.       Expected Outcomes Short Term: Increase workloads from initial exercise prescription for resistance, speed, and METs.;Short Term: Perform resistance training exercises routinely during rehab and add in resistance training at home;Long Term: Improve cardiorespiratory fitness, muscular endurance and strength as measured by increased METs and functional capacity (6MWT)       Able to understand and use rate of perceived exertion (RPE) scale Yes       Intervention Provide education and explanation on how to use RPE scale       Expected Outcomes Short Term: Able to use RPE daily in rehab to express subjective intensity level;Long Term:  Able to use RPE to guide intensity level when exercising independently       Knowledge and understanding of Target Heart Rate Range (THRR) Yes       Intervention Provide education and explanation of THRR including how the numbers were predicted and where they are located for reference       Expected Outcomes Short Term: Able to state/look up THRR;Long Term: Able to use THRR to govern intensity when exercising independently;Short Term: Able to use daily as guideline for intensity in rehab       Able to check pulse independently Yes       Intervention Provide education and demonstration on how  to check pulse in carotid and radial arteries.;Review the importance of being able to check your own pulse for safety during independent exercise       Expected Outcomes Short Term: Able to explain why pulse checking is important during independent exercise;Long Term: Able to check pulse independently and accurately       Understanding of Exercise Prescription Yes       Intervention Provide education, explanation, and written materials on patient's individual exercise prescription       Expected Outcomes Short Term: Able to explain program exercise prescription;Long Term: Able to explain home exercise prescription to exercise independently                Exercise Goals Re-Evaluation:  Exercise Goals Re-Evaluation     [place] Name 10/18/20 1158 10/29/20 1051 11/15/20 1058 12/06/20 1100       Exercise Goal Re-Evaluation   Exercise Goals Review Increase Physical Activity;Able to understand and use rate of perceived exertion (RPE) scale Increase Physical Activity;Able to understand and use rate of perceived exertion (RPE) scale;Increase Strength and Stamina;Understanding of Exercise Prescription;Knowledge and understanding of Target Heart Rate Range (THRR) Increase Physical Activity;Able to understand and use rate of perceived exertion (RPE) scale;Increase Strength and Stamina;Understanding of Exercise Prescription;Knowledge and understanding of Target Heart Rate Range (THRR) Increase Physical Activity;Able to understand and use rate of perceived exertion (RPE) scale;Increase Strength and Stamina;Understanding of  Exercise Prescription;Knowledge and understanding of Target Heart Rate Range (THRR)    Comments Patient able to understand and use RPE scale appropriately. Reviewed home exercise guidelines with patient including endpoints, temperature precautions, target heart rate and rate of perceived exertion. Patient plans to walk as his mode of home exercise. Patient has 5 lb weights that he can use for  his resistance exercise once he works up to that weight. Patient's goal is to lose weight and increase muscle tone. Patient is walking 30 minutes, at least 5 days/week at home. Patient scheduled to graduate this week and has progressed well. Functional capacity increased 22% as measured by 6MWT, strength increased 10% as measured by grip strength test. Patient plans to continue walking at home as his mode of exercise.    Expected Outcomes Progress workloads as tolerated to help improve cardiorespiratory fitness. Patient will add 1-2 days of walking 30 minutes at home in addition to exercise at cardiac rehab. Patient will continue walking routine at home in addition to exercise at cardiac rehab to help achieve personal health and fitness goals. Patient will walk at least 30 minutes 5-7 days/week to maintain health and fitness benefits.             Nutrition & Weight - Outcomes:  Pre Biometrics - 10/12/20 1036       Pre Biometrics   Waist Circumference 38 inches    Hip Circumference 41.5 inches    Waist to Hip Ratio 0.92 %    Triceps Skinfold 12 mm    % Body Fat 25 %    Grip Strength 41.5 kg    Flexibility 19.13 in    Single Leg Stand 5.62 seconds             Post Biometrics - 12/06/20 1100        Post  Biometrics   Waist Circumference 38.25 inches    Hip Circumference 40.5 inches    Waist to Hip Ratio 0.94 %    Triceps Skinfold 16 mm    Grip Strength 45.5 kg    Flexibility 19.25 in    Single Leg Stand 4.81 seconds             Nutrition:  Nutrition Therapy & Goals - 11/15/20 1037       Nutrition Therapy   Diet TLC      Personal Nutrition Goals   Nutrition Goal Pt to build a healthy plate including vegetables, fruits, whole grains, and low-fat dairy products in a heart healthy meal plan.    Personal Goal #2 Pt to reduce sugary beverages to <2 per day      Intervention Plan   Intervention Prescribe, educate and counsel regarding individualized specific dietary  modifications aiming towards targeted core components such as weight, hypertension, lipid management, diabetes, heart failure and other comorbidities.;Nutrition handout(s) given to patient.    Expected Outcomes Short Term Goal: Understand basic principles of dietary content, such as calories, fat, sodium, cholesterol and nutrients.             Nutrition Discharge:    Education Questionnaire Score:  Knowledge Questionnaire Score - 12/10/20 0827       Knowledge Questionnaire Score   Pre Score 22/24    Post Score 22/24             Goals reviewed with patient; copy given to patient. Pt graduated from cardiac rehab program on 12/10/20 with completion of 22 exercise sessions in Phase II. Pt maintained good attendance and  progressed nicely during his participation in rehab as evidenced by increased MET level.   Medication list reconciled. Repeat  PHQ score-0  .  Pt has made significant lifestyle changes and should be commended for his success. Pt feels he has achieved his goals during cardiac rehab. Karel increased his distance on his post exercise walk test by 367 feet.  Pt plans to continue exercise by walking. We are proud of Asar's progress. Barnet Pall, RN,BSN 12/10/2020 11:32 AM

## 2020-12-14 NOTE — Progress Notes (Signed)
Cardiology Office Note:    Date:  12/15/2020   ID:  Brian Mccullough, DOB [birth year], MRN [id number]  PCP:  Cassandria Anger, MD   [place]  Cardiologist:  Sherren Mocha, MD   Advanced Practice Provider:  Liliane Shi, PA-C Electrophysiologist:  None       Referring MD: Cassandria Anger, MD   Chief Complaint:  Follow-up (CAD)    Patient Profile:    Brian Mccullough is a 70 y.o. male with:   Coronary artery disease ? S/p Inf STEMI 10/21 >> PCI: POBA to RCA  S/p CABG 10/21 (L-LAD, S-PDA, L radial-OM1/RI, S-D1, RIMA-RPLA)  Post AFib >> Amiodarone  Carotid artery disease ? [place] 10/21: R 1-39; L 40-59  Diabetes mellitus   Hypertension   Hyperlipidemia  SVT   Prior CV studies: Cardiac catheterization 08/14/20 LM mid 70 LAD prox 80; D1 80 RI 75 LCx prox 70 RCA prox 95, mid 75; RPDA 90, 100 EF 50-55. Inf HK PCI: POBA to prox RCA   Echocardiogram 08/04/20 EF 55-60, no RWMA, GLS -18.5, normal RVSF, mild LAE, trivial MR  Carotid US 08/04/20 R 1-39, L 40-59  History of Present Illness:    Brian Mccullough was last seen in 09/2020.  He returns for f/u.  He is here alone.  He has completed cardiac rehabilitation.   He is doing well without shortness of breath, syncope, orthopnea, leg edema. He still has some chest soreness.  He has not had significant palpitations.      Past Medical History:  Diagnosis Date  . Allergy   . CAD (coronary artery disease)    s/p Inf STEMI 10/21>>POBA to RCA >> s/p CABG (L-LAD, S-PDA, L Radial-OM1/RI, RIMA-RPLA) // post op AF; SVT >> Amiod Rx // Echo 10/21: EF 55-60, no RWMA, GLS -18.5, normal RVSF, mild LAE, trivial MR   . Carotid artery disease ([place])    [place] 10/21: R 1-39; L 40-59  . Hyperactive gag reflex   . Hypertension    under control with meds., has been on med. x 30 yr.  . Inguinal hernia 01/2014   bilateral  . LBP (low back pain)   . Non-insulin dependent type 2 diabetes mellitus ([place])    . OA (osteoarthritis) of knee 01/2004   left  . Palpitations   . s/p Inf STEMI 07/2020   POBA to RCA >> CABG  . Vitamin B 12 deficiency     Current Medications: Current Meds  Medication Sig  . aspirin 81 MG EC tablet Take 81 mg by mouth daily.  Marland Kitchen atorvastatin (LIPITOR) 80 MG tablet Take 1 tablet (80 mg total) by mouth daily.  . benazepril (LOTENSIN) 20 MG tablet Take 1 tablet (20 mg total) by mouth daily.  . Cholecalciferol (VITAMIN D3) 1000 UNITS tablet Take 1,000 Units by mouth daily.  . clopidogrel (PLAVIX) 75 MG tablet Take 1 tablet (75 mg total) by mouth daily.  . cyanocobalamin 100 MCG tablet Take 1,000 mcg by mouth daily.  . furosemide (LASIX) 20 MG tablet Take 1 tablet (20 mg total) by mouth as needed for fluid or edema.  Marland Kitchen loratadine (CLARITIN) 10 MG tablet Take 10 mg by mouth daily as needed for allergies or rhinitis.  . meloxicam (MOBIC) 15 MG tablet Take 1 tablet (15 mg total) by mouth daily as needed. for pain  . metFORMIN (GLUCOPHAGE) 500 MG tablet Take 1 tablet (500 mg total) by mouth daily with breakfast.  . metoprolol tartrate (  LOPRESSOR) 25 MG tablet Take 12.5 mg by mouth daily.  . potassium chloride SA (KLOR-CON) 20 MEQ tablet Take 1 tablet (20 mEq total) by mouth daily.  Marland Kitchen tiZANidine (ZANAFLEX) 4 MG tablet TAKE 1 TABLET BY MOUTH  EVERY 8 HOURS AS NEEDED FOR MUSCLE SPASM(S)  . traMADol (ULTRAM) 50 MG tablet TAKE 1 TO 2 TABLETS BY MOUTH TWICE DAILY AS NEEDED  . traZODone (DESYREL) 50 MG tablet TAKE 1/2 TO 1 TABLETS BY  MOUTH AT BEDTIME AS NEEDED  FOR SLEEP.  . [DISCONTINUED] amiodarone (PACERONE) 200 MG tablet Take 1 tablet (200 mg total) by mouth daily.  . [DISCONTINUED] metoprolol tartrate (LOPRESSOR) 25 MG tablet Take 0.5 tablets (12.5 mg total) by mouth 2 (two) times daily.     Allergies:   Codeine   Social History   Tobacco Use  . Smoking status: Former Smoker    Years: 22.00    Types: Cigars    Quit date: 08/03/2020    Years since quitting: 0.3  .  Smokeless tobacco: Never Used  . Tobacco comment: 2-3 small cigars/day  Vaping Use  . Vaping Use: Never used  Substance Use Topics  . Alcohol use: No  . Drug use: No     Family Hx: The patient's family history includes Diabetes in his father and mother; Hypertension in his mother. There is no history of Colon cancer, Esophageal cancer, Rectal cancer, or Stomach cancer.  ROS   EKGs/Labs/Other Test Reviewed:    EKG:  EKG is   ordered today.  The ekg ordered today demonstrates sinus brady, HR46, LAD, TW inversions 3, aVF, V3, inc RBBB, QTc 411, no sig ?Marland Kitchen  Recent Labs: 08/04/2020: B Natriuretic Peptide 529.0; TSH 0.673 08/05/2020: Magnesium 2.1 08/07/2020: Hemoglobin 10.6; Platelets 122 10/19/2020: ALT 8; BUN 13; Creatinine, Ser 1.10; Potassium 3.9; Sodium 140   Recent Lipid Panel Lab Results  Component Value Date/Time   CHOL 104 10/19/2020 10:23 AM   TRIG 65 10/19/2020 10:23 AM   HDL 45 10/19/2020 10:23 AM   CHOLHDL 2.3 10/19/2020 10:23 AM   CHOLHDL 4.6 08/04/2020 04:19 AM   LDLCALC 45 10/19/2020 10:23 AM   LDLDIRECT 88.1 01/23/2011 08:29 AM      Risk Assessment/Calculations:    CHA2DS2-VASc Score = 4  This indicates a 4.8% annual risk of stroke. The patient's score is based upon: CHF History: No HTN History: Yes Diabetes History: Yes Stroke History: No Vascular Disease History: Yes Age Score: 1 Gender Score: 0     Physical Exam:    VS:  BP 138/78   Pulse (!) 46   Ht 6' 3.5" (1.918 m)   Wt 231 lb 6.4 oz (105 kg)   SpO2 95%   BMI 28.54 kg/m     Wt Readings from Last 3 Encounters:  12/15/20 231 lb 6.4 oz (105 kg)  12/10/20 228 lb 2.8 oz (103.5 kg)  12/02/20 227 lb 6.4 oz (103.1 kg)     Constitutional:      Appearance: Healthy appearance. Not in distress.  Neck:     Vascular: No JVR. JVD normal.  Pulmonary:     Effort: Pulmonary effort is normal.     Breath sounds: No wheezing. No rales.  Cardiovascular:     Normal rate. Regular rhythm. Normal S1.  Normal S2.     Murmurs: There is no murmur.  Edema:    Peripheral edema absent.  Abdominal:     Palpations: Abdomen is soft. There is no hepatomegaly.  Skin:  General: Skin is warm and dry.  Neurological:     General: No focal deficit present.     Mental Status: Alert and oriented to person, place and time.     Cranial Nerves: Cranial nerves are intact.       ASSESSMENT & PLAN:    1. Coronary artery disease involving native coronary artery of native heart without angina pectoris S/p  inferior STEMI 10/2021treated with angioplasty of the RCA and followed by CABG.  He is doing well without angina.  He has completed cardiac rehabilitation.  Continue ASA, clopidogrel, atorvastatin, metoprolol tartrate, benazepril.  F/u in 6 mos.  2. Postoperative atrial fibrillation ([place]) He is maintaining normal sinus rhythm.  He can DC Amiodarone now.  He does have a hx of SVT.  If this becomes more of an issue in the future, we can refer him to EP.    3. Bradycardia Asymptomatic.  His HR should improve with stopping Amiodarone.    4. Essential hypertension BP is borderline.  I have asked him to monitor his BP for 2 weeks and send to me via MyChart.  If his BP remains above > 130/80, I will start him on Amlodipine.    5. Mixed hyperlipidemia LDL optimal on most recent lab work.  Continue current Rx.      Dispo:  Return in about 6 months (around 06/17/2021) for Routine Follow Up, w/ Dr. Burt Knack, or Richardson Dopp, PA-C, in person.   Medication Adjustments/Labs and Tests Ordered: Current medicines are reviewed at length with the patient today.  Concerns regarding medicines are outlined above.  Tests Ordered: Orders Placed This Encounter  Procedures  . EKG 12-Lead   Medication Changes: No orders of the defined types were placed in this encounter.   Signed, Richardson Dopp, PA-C  12/15/2020 2:25 PM    [facility] [place], [place], [place]  [address] Phone: [phone]; Fax: [phone]

## 2020-12-15 ENCOUNTER — Other Ambulatory Visit: Payer: Self-pay

## 2020-12-15 ENCOUNTER — Ambulatory Visit: Payer: Medicare Other | Admitting: Physician Assistant

## 2020-12-15 ENCOUNTER — Encounter: Payer: Self-pay | Admitting: Physician Assistant

## 2020-12-15 VITALS — BP 138/78 | HR 46 | Ht 75.5 in | Wt 231.4 lb

## 2020-12-15 DIAGNOSIS — I4891 Unspecified atrial fibrillation: Secondary | ICD-10-CM | POA: Diagnosis not present

## 2020-12-15 DIAGNOSIS — R001 Bradycardia, unspecified: Secondary | ICD-10-CM | POA: Diagnosis not present

## 2020-12-15 DIAGNOSIS — I2510 Atherosclerotic heart disease of native coronary artery without angina pectoris: Secondary | ICD-10-CM

## 2020-12-15 DIAGNOSIS — I10 Essential (primary) hypertension: Secondary | ICD-10-CM

## 2020-12-15 DIAGNOSIS — I9789 Other postprocedural complications and disorders of the circulatory system, not elsewhere classified: Secondary | ICD-10-CM | POA: Diagnosis not present

## 2020-12-15 DIAGNOSIS — E782 Mixed hyperlipidemia: Secondary | ICD-10-CM

## 2020-12-15 NOTE — Patient Instructions (Signed)
Medication Instructions:  Your physician has recommended you make the following change in your medication:   1.  Discontinue Amiodarone  *If you need a refill on your cardiac medications before your next appointment, please call your pharmacy*   Lab Work: -None If you have labs (blood work) drawn today and your tests are completely normal, you will receive your results only by: Marland Kitchen MyChart Message (if you have MyChart) OR . A paper copy in the mail If you have any lab test that is abnormal or we need to change your treatment, we will call you to review the results.   Testing/Procedures: -None   Follow-Up: At [facility], you and your health needs are our priority.  As part of our continuing mission to provide you with exceptional heart care, we have created designated Provider Care Teams.  These Care Teams include your primary Cardiologist (physician) and Advanced Practice Providers (APPs -  Physician Assistants and Nurse Practitioners) who all work together to provide you with the care you need, when you need it.  We recommend signing up for the patient portal called "MyChart".  Sign up information is provided on this After Visit Summary.  MyChart is used to connect with patients for Virtual Visits (Telemedicine).  Patients are able to view lab/test results, encounter notes, upcoming appointments, etc.  Non-urgent messages can be sent to your provider as well.   To learn more about what you can do with MyChart, go to [email or web address].    Your next appointment:   6 month(s)  The format for your next appointment:   In Person  Provider:   You may see Brian Mocha, MD or one of the following Advanced Practice Providers on your designated Care Team:    Brian Dopp, PA-C    Other Instructions Check bp @ home X 1-2 weeks and send readings via mychart  Your physician wants you to follow-up in: 6 months with Dr.Cooper or Brian Dopp, PA.  You will receive a reminder  letter in the mail two months in advance. If you don't receive a letter, please call our office to schedule the follow-up appointment.

## 2020-12-19 ENCOUNTER — Other Ambulatory Visit: Payer: Self-pay | Admitting: Physician Assistant

## 2020-12-20 ENCOUNTER — Other Ambulatory Visit: Payer: Self-pay | Admitting: Physician Assistant

## 2020-12-21 ENCOUNTER — Other Ambulatory Visit: Payer: Self-pay | Admitting: Physician Assistant

## 2020-12-22 ENCOUNTER — Other Ambulatory Visit: Payer: Self-pay | Admitting: Physician Assistant

## 2020-12-23 ENCOUNTER — Other Ambulatory Visit: Payer: Self-pay | Admitting: Physician Assistant

## 2020-12-29 ENCOUNTER — Ambulatory Visit: Payer: Medicare Other | Admitting: Internal Medicine

## 2020-12-29 NOTE — Progress Notes (Signed)
Received records from cardiac rehabilitation.  His blood pressure flowsheets demonstrate fairly optimal blood pressures at rest. PLAN: Continue current medications/treatment plan and follow up as scheduled. Richardson Dopp, PA-C    12/29/2020 11:33 AM

## 2021-01-18 ENCOUNTER — Other Ambulatory Visit: Payer: Self-pay | Admitting: Physician Assistant

## 2021-02-23 ENCOUNTER — Other Ambulatory Visit: Payer: Self-pay | Admitting: Physician Assistant

## 2021-03-03 ENCOUNTER — Ambulatory Visit: Payer: Medicare Other | Admitting: Internal Medicine

## 2021-03-14 ENCOUNTER — Ambulatory Visit: Payer: Medicare Other | Admitting: Internal Medicine

## 2021-03-16 ENCOUNTER — Telehealth: Payer: Self-pay | Admitting: Physician Assistant

## 2021-03-16 DIAGNOSIS — E782 Mixed hyperlipidemia: Secondary | ICD-10-CM

## 2021-03-16 MED ORDER — ATORVASTATIN CALCIUM 40 MG PO TABS: 40.0000 mg | ORAL_TABLET | Freq: Every day | ORAL | 3 refills | Status: DC

## 2021-03-16 NOTE — Telephone Encounter (Signed)
Called pt notified of provider recommendations.  He is agreeable to plan orders placed.  Pt states he understands instructions.

## 2021-03-16 NOTE — Telephone Encounter (Signed)
Pt c/o medication issue:  1. Name of Medication: atorvastatin (LIPITOR) 80 MG tablet  2. How are you currently taking this medication (dosage and times per day)? 1 tablet daily  3. Are you having a reaction (difficulty breathing--STAT)? no  4. What is your medication issue? Patient states he has been having muscle pain and tenderness. He would like to know if he should continue on the medication.

## 2021-03-16 NOTE — Telephone Encounter (Signed)
Decrease Lipitor to 40 mg once daily. Check fasting Lipids in 3 mos. If muscle pain does not improve let me know. Richardson Dopp, PA-C    03/16/2021 5:01 PM

## 2021-03-29 ENCOUNTER — Other Ambulatory Visit: Payer: Self-pay

## 2021-03-29 ENCOUNTER — Ambulatory Visit (INDEPENDENT_AMBULATORY_CARE_PROVIDER_SITE_OTHER): Payer: Medicare Other | Admitting: Internal Medicine

## 2021-03-29 ENCOUNTER — Encounter: Payer: Self-pay | Admitting: Internal Medicine

## 2021-03-29 DIAGNOSIS — Z951 Presence of aortocoronary bypass graft: Secondary | ICD-10-CM

## 2021-03-29 DIAGNOSIS — T466X5A Adverse effect of antihyperlipidemic and antiarteriosclerotic drugs, initial encounter: Secondary | ICD-10-CM

## 2021-03-29 DIAGNOSIS — I10 Essential (primary) hypertension: Secondary | ICD-10-CM

## 2021-03-29 DIAGNOSIS — E119 Type 2 diabetes mellitus without complications: Secondary | ICD-10-CM

## 2021-03-29 DIAGNOSIS — G720 Drug-induced myopathy: Secondary | ICD-10-CM | POA: Diagnosis not present

## 2021-03-29 DIAGNOSIS — E538 Deficiency of other specified B group vitamins: Secondary | ICD-10-CM

## 2021-03-29 DIAGNOSIS — I25110 Atherosclerotic heart disease of native coronary artery with unstable angina pectoris: Secondary | ICD-10-CM | POA: Diagnosis not present

## 2021-03-29 LAB — COMPREHENSIVE METABOLIC PANEL
ALT: 7 U/L (ref 0–53)
AST: 13 U/L (ref 0–37)
Albumin: 4 g/dL (ref 3.5–5.2)
Alkaline Phosphatase: 60 U/L (ref 39–117)
BUN: 13 mg/dL (ref 6–23)
CO2: 27 mEq/L (ref 19–32)
Calcium: 9.3 mg/dL (ref 8.4–10.5)
Chloride: 104 mEq/L (ref 96–112)
Creatinine, Ser: 1.17 mg/dL (ref 0.40–1.50)
GFR: 63.79 mL/min (ref >60.00)
Glucose, Bld: 77 mg/dL (ref 70–99)
Potassium: 4.3 mEq/L (ref 3.5–5.1)
Sodium: 138 mEq/L (ref 135–145)
Total Bilirubin: 0.7 mg/dL (ref 0.2–1.2)
Total Protein: 7 g/dL (ref 6.0–8.3)

## 2021-03-29 LAB — HEMOGLOBIN A1C: Hgb A1c MFr Bld: 5.9 % (ref 4.6–6.5)

## 2021-03-29 LAB — MICROALBUMIN / CREATININE URINE RATIO
Creatinine,U: 253.1 mg/dL
Microalb Creat Ratio: 0.8 mg/g (ref 0.0–30.0)
Microalb, Ur: 2 mg/dL — ABNORMAL HIGH (ref 0.0–1.9)

## 2021-03-29 NOTE — Addendum Note (Signed)
Addended by: Boris Lown B on: 03/29/2021 09:46 AM   Modules accepted: Orders

## 2021-03-29 NOTE — Assessment & Plan Note (Signed)
On B12 

## 2021-03-29 NOTE — Assessment & Plan Note (Signed)
Cont w/Benazepril, Toprol, Lipitor, Plavix

## 2021-03-29 NOTE — Assessment & Plan Note (Addendum)
Cont w/Benazepril, Toprol

## 2021-03-29 NOTE — Assessment & Plan Note (Signed)
Myalgias are better on less Lipitor - 40 mg/d

## 2021-03-29 NOTE — Progress Notes (Signed)
Subjective:  Patient ID: Brian Mccullough, male    DOB: [birth year]  Age: 70 y.o. MRN: [id number]  CC: Follow-up (4 month f/u)   HPI Brian Mccullough presents for DM, HTN, OA f/u Myalgias are better on less Lipitor  Outpatient Medications Prior to Visit  Medication Sig Dispense Refill   aspirin 81 MG EC tablet Take 81 mg by mouth daily.     atorvastatin (LIPITOR) 40 MG tablet Take 1 tablet (40 mg total) by mouth daily. 90 tablet 3   benazepril (LOTENSIN) 20 MG tablet Take 1 tablet (20 mg total) by mouth daily. 90 tablet 3   Cholecalciferol (VITAMIN D3) 1000 UNITS tablet Take 1,000 Units by mouth daily.     clopidogrel (PLAVIX) 75 MG tablet Take 1 tablet (75 mg total) by mouth daily. 90 tablet 3   cyanocobalamin 100 MCG tablet Take 1,000 mcg by mouth daily.     furosemide (LASIX) 20 MG tablet TAKE 1 TABLET BY MOUTH AS NEEDED FOR FLUID OR EDEMA 30 tablet 3   loratadine (CLARITIN) 10 MG tablet Take 10 mg by mouth daily as needed for allergies or rhinitis.     meloxicam (MOBIC) 15 MG tablet Take 1 tablet (15 mg total) by mouth daily as needed. for pain 90 tablet 3   metFORMIN (GLUCOPHAGE) 500 MG tablet Take 1 tablet (500 mg total) by mouth daily with breakfast. 90 tablet 3   metoprolol tartrate (LOPRESSOR) 25 MG tablet Take 12.5 mg by mouth daily.     potassium chloride SA (KLOR-CON) 20 MEQ tablet Take 1 tablet (20 mEq total) by mouth daily. 30 tablet 11   tiZANidine (ZANAFLEX) 4 MG tablet TAKE 1 TABLET BY MOUTH  EVERY 8 HOURS AS NEEDED FOR MUSCLE SPASM(S) 270 tablet 0   traMADol (ULTRAM) 50 MG tablet TAKE 1 TO 2 TABLETS BY MOUTH TWICE DAILY AS NEEDED 100 tablet 3   traZODone (DESYREL) 50 MG tablet TAKE 1/2 TO 1 TABLETS BY  MOUTH AT BEDTIME AS NEEDED  FOR SLEEP. 90 tablet 3   No facility-administered medications prior to visit.    ROS: Review of Systems  Constitutional:  Negative for appetite change, fatigue and unexpected weight change.  HENT:  Negative for congestion, nosebleeds,  sneezing, sore throat and trouble swallowing.   Eyes:  Negative for itching and visual disturbance.  Respiratory:  Negative for cough.   Cardiovascular:  Negative for chest pain, palpitations and leg swelling.  Gastrointestinal:  Negative for abdominal distention, blood in stool, diarrhea and nausea.  Genitourinary:  Negative for frequency and hematuria.  Musculoskeletal:  Positive for back pain and myalgias. Negative for gait problem, joint swelling and neck pain.  Skin:  Negative for rash.  Neurological:  Negative for dizziness, tremors, speech difficulty and weakness.  Psychiatric/Behavioral:  Negative for agitation, dysphoric mood and sleep disturbance. The patient is not nervous/anxious.    Objective:  BP 138/72 (BP Location: Left Arm)   Pulse (!) 42   Temp 98.4 F (36.9 C) (Oral)   Ht 6' 3.5" (1.918 m)   Wt 229 lb 6.4 oz (104.1 kg)   SpO2 99%   BMI 28.29 kg/m   BP Readings from Last 3 Encounters:  03/29/21 138/72  12/15/20 138/78  12/10/20 128/62    Wt Readings from Last 3 Encounters:  03/29/21 229 lb 6.4 oz (104.1 kg)  12/15/20 231 lb 6.4 oz (105 kg)  12/10/20 228 lb 2.8 oz (103.5 kg)    Physical Exam Constitutional:  General: He is not in acute distress.    Appearance: He is well-developed.     Comments: NAD  Eyes:     Conjunctiva/sclera: Conjunctivae normal.     Pupils: Pupils are equal, round, and reactive to light.  Neck:     Thyroid: No thyromegaly.     Vascular: No JVD.  Cardiovascular:     Rate and Rhythm: Regular rhythm. Bradycardia present.     Heart sounds: Normal heart sounds. No murmur heard.   No friction rub. No gallop.  Pulmonary:     Effort: Pulmonary effort is normal. No respiratory distress.     Breath sounds: Normal breath sounds. No wheezing or rales.  Chest:     Chest wall: No tenderness.  Abdominal:     General: Bowel sounds are normal. There is no distension.     Palpations: Abdomen is soft. There is no mass.     Tenderness:  There is no abdominal tenderness. There is no guarding or rebound.  Musculoskeletal:        General: No tenderness. Normal range of motion.     Cervical back: Normal range of motion.  Lymphadenopathy:     Cervical: No cervical adenopathy.  Skin:    General: Skin is warm and dry.     Findings: No rash.  Neurological:     Mental Status: He is alert and oriented to person, place, and time.     Cranial Nerves: No cranial nerve deficit.     Motor: No weakness or abnormal muscle tone.     Coordination: Coordination normal.     Gait: Gait normal.     Deep Tendon Reflexes: Reflexes are normal and symmetric.  Psychiatric:        Behavior: Behavior normal.        Thought Content: Thought content normal.        Judgment: Judgment normal.   Keloid scars  Lab Results  Component Value Date   WBC 10.0 08/07/2020   HGB 10.6 (L) 08/07/2020   HCT 31.2 (L) 08/07/2020   PLT 122 (L) 08/07/2020   GLUCOSE 90 10/19/2020   CHOL 104 10/19/2020   TRIG 65 10/19/2020   HDL 45 10/19/2020   LDLDIRECT 88.1 01/23/2011   LDLCALC 45 10/19/2020   ALT 8 10/19/2020   AST 13 10/19/2020   NA 140 10/19/2020   K 3.9 10/19/2020   CL 106 10/19/2020   CREATININE 1.10 10/19/2020   BUN 13 10/19/2020   CO2 20 10/19/2020   TSH 0.673 08/04/2020   PSA 0.26 07/30/2020   INR 1.5 (H) 08/04/2020   HGBA1C 5.4 08/04/2020   MICROALBUR <0.2 04/26/2020    No results found.  Assessment & Plan:   There are no diagnoses linked to this encounter.   No orders of the defined types were placed in this encounter.    Follow-up: No follow-ups on file.  Walker Kehr, MD

## 2021-03-29 NOTE — Assessment & Plan Note (Signed)
On Metformin 

## 2021-03-30 ENCOUNTER — Encounter: Payer: Self-pay | Admitting: Internal Medicine

## 2021-04-25 ENCOUNTER — Telehealth: Payer: Self-pay | Admitting: Internal Medicine

## 2021-04-25 NOTE — Telephone Encounter (Signed)
Patient has called stating when he took his normal medication recently, he felt different. After taking the medication, when standing he would feel dizzy, lightheaded, and close to passing out.  When taking the medication again yesterday, he felt the same way. Normally never happened, wants advice.  FYI- Patient made an appointment to be seen 7.19.22

## 2021-04-26 ENCOUNTER — Encounter: Payer: Self-pay | Admitting: Internal Medicine

## 2021-04-26 ENCOUNTER — Other Ambulatory Visit: Payer: Self-pay

## 2021-04-26 ENCOUNTER — Ambulatory Visit (INDEPENDENT_AMBULATORY_CARE_PROVIDER_SITE_OTHER): Payer: Medicare Other | Admitting: Internal Medicine

## 2021-04-26 DIAGNOSIS — R197 Diarrhea, unspecified: Secondary | ICD-10-CM | POA: Insufficient documentation

## 2021-04-26 DIAGNOSIS — R55 Syncope and collapse: Secondary | ICD-10-CM

## 2021-04-26 DIAGNOSIS — A09 Infectious gastroenteritis and colitis, unspecified: Secondary | ICD-10-CM | POA: Diagnosis not present

## 2021-04-26 DIAGNOSIS — I10 Essential (primary) hypertension: Secondary | ICD-10-CM

## 2021-04-26 LAB — BASIC METABOLIC PANEL
BUN: 11 mg/dL (ref 6–23)
CO2: 28 mEq/L (ref 19–32)
Calcium: 9.2 mg/dL (ref 8.4–10.5)
Chloride: 103 mEq/L (ref 96–112)
Creatinine, Ser: 0.97 mg/dL (ref 0.40–1.50)
GFR: 79.84 mL/min (ref >60.00)
Glucose, Bld: 98 mg/dL (ref 70–99)
Potassium: 4.2 mEq/L (ref 3.5–5.1)
Sodium: 138 mEq/L (ref 135–145)

## 2021-04-26 LAB — CBC WITH DIFFERENTIAL/PLATELET
Basophils Absolute: 0 10*3/uL (ref 0.0–0.1)
Basophils Relative: 0.6 % (ref 0.0–3.0)
Eosinophils Absolute: 0.1 10*3/uL (ref 0.0–0.7)
Eosinophils Relative: 2.2 % (ref 0.0–5.0)
HCT: 42.8 % (ref 39.0–52.0)
Hemoglobin: 14.4 g/dL (ref 13.0–17.0)
Lymphocytes Relative: 34.2 % (ref 12.0–46.0)
Lymphs Abs: 1.2 10*3/uL (ref 0.7–4.0)
MCHC: 33.8 g/dL (ref 30.0–36.0)
MCV: 86.3 fl (ref 78.0–100.0)
Monocytes Absolute: 0.5 10*3/uL (ref 0.1–1.0)
Monocytes Relative: 13.4 % — ABNORMAL HIGH (ref 3.0–12.0)
Neutro Abs: 1.7 10*3/uL (ref 1.4–7.7)
Neutrophils Relative %: 49.6 % (ref 43.0–77.0)
Platelets: 178 10*3/uL (ref 150.0–400.0)
RBC: 4.95 Mil/uL (ref 4.22–5.81)
RDW: 14.2 % (ref 11.5–15.5)
WBC: 3.4 10*3/uL — ABNORMAL LOW (ref 4.0–10.5)

## 2021-04-26 NOTE — Patient Instructions (Addendum)
Use Imodium AD as needed Hydrate well Hold Benazepril and furosemide for 2 more daysDiarrhea, Adult Diarrhea is when you pass loose and watery poop (stool) often. Diarrhea can make you feel weak and cause you to lose water in your body (get dehydrated). Losing water in your body can cause you to: Feel tired and thirsty. Have a dry mouth. Go pee (urinate) less often. Diarrhea often lasts 2-3 days. However, it can last longer if it is a sign of something more serious. It is important to treat your diarrhea as told by yourdoctor. Follow these instructions at home: Eating and drinking     Follow these instructions as told by your doctor: Take an ORS (oral rehydration solution). This is a drink that helps you replace fluids and minerals your body lost. It is sold at pharmacies and stores. Drink plenty of fluids, such as: Water. Ice chips. Diluted fruit juice. Low-calorie sports drinks. Milk, if you want. Avoid drinking fluids that have a lot of sugar or caffeine in them. Eat bland, easy-to-digest foods in small amounts as you are able. These foods include: Bananas. Applesauce. Rice. Low-fat (lean) meats. Toast. Crackers. Avoid alcohol. Avoid spicy or fatty foods.  Medicines Take over-the-counter and prescription medicines only as told by your doctor. If you were prescribed an antibiotic medicine, take it as told by your doctor. Do not stop using the antibiotic even if you start to feel better. General instructions  Wash your hands often using soap and water. If soap and water are not available, use a hand sanitizer. Others in your home should wash their hands as well. Hands should be washed: After using the toilet or changing a diaper. Before preparing, cooking, or serving food. While caring for a sick person. While visiting someone in a hospital. Drink enough fluid to keep your pee (urine) pale yellow. Rest at home while you get better. Watch your condition for any  changes. Take a warm bath to help with any burning or pain from having diarrhea. Keep all follow-up visits as told by your doctor. This is important.  Contact a doctor if: You have a fever. Your diarrhea gets worse. You have new symptoms. You cannot keep fluids down. You feel light-headed or dizzy. You have a headache. You have muscle cramps. Get help right away if: You have chest pain. You feel very weak or you pass out (faint). You have bloody or black poop or poop that looks like tar. You have very bad pain, cramping, or bloating in your belly (abdomen). You have trouble breathing or you are breathing very quickly. Your heart is beating very quickly. Your skin feels cold and clammy. You feel confused. You have signs of losing too much water in your body, such as: Dark pee, very little pee, or no pee. Cracked lips. Dry mouth. Sunken eyes. Sleepiness. Weakness. Summary Diarrhea is when you pass loose and watery poop (stool) often. Diarrhea can make you feel weak and cause you to lose water in your body (get dehydrated). Take an ORS (oral rehydration solution). This is a drink that is sold at pharmacies and stores. Eat bland, easy-to-digest foods in small amounts as you are able. Contact a doctor if your condition gets worse. Get help right away if you have signs that you have lost too much water in your body. This information is not intended to replace advice given to you by your health care provider. Make sure you discuss any questions you have with your healthcare provider. Document Revised:  03/01/2018 Document Reviewed: 03/01/2018 Elsevier Patient Education  [place].

## 2021-04-26 NOTE — Assessment & Plan Note (Addendum)
New - ?orthostatic due to dehydration/diarrhea No head injury EKG -unchanged Use Imodium AD as needed Hydrate well Hold Benazepril and furosemide for 2 more days

## 2021-04-26 NOTE — Assessment & Plan Note (Signed)
Hold blood pressure meds, except for metoprolol.  Hydrate well

## 2021-04-26 NOTE — Assessment & Plan Note (Addendum)
A viral syndrome Use Imodium AD Hydrate well Hold Benazepril and furosemide for 2 more days Get a COVID test done.

## 2021-04-26 NOTE — Progress Notes (Signed)
Subjective:  Patient ID: Brian Mccullough, male    DOB: 1951/06/03  Age: 70 y.o. MRN: [id number]  CC: Dizziness (Pt states on Sat & Sun after taking his medications he became dizzy/lightheaded)   HPI ERYC BODEY presents for lightheadedness and dizziness on Sat and Sun after he took his meds (with standing up). He passed out on Sun x1, fell off the floor. No CP He was sick on Thursday with HA, feeling bad, weak x 2 days. On Sat he had diarrhea all day - better. He did not take his meds on Mon, except for Metoprolol   Outpatient Medications Prior to Visit  Medication Sig Dispense Refill   metoprolol tartrate (LOPRESSOR) 25 MG tablet Take 12.5 mg by mouth daily.     aspirin 81 MG EC tablet Take 81 mg by mouth daily. (Patient not taking: Reported on 04/26/2021)     atorvastatin (LIPITOR) 40 MG tablet Take 1 tablet (40 mg total) by mouth daily. (Patient not taking: Reported on 04/26/2021) 90 tablet 3   benazepril (LOTENSIN) 20 MG tablet Take 1 tablet (20 mg total) by mouth daily. (Patient not taking: Reported on 04/26/2021) 90 tablet 3   Cholecalciferol (VITAMIN D3) 1000 UNITS tablet Take 1,000 Units by mouth daily. (Patient not taking: Reported on 04/26/2021)     clopidogrel (PLAVIX) 75 MG tablet Take 1 tablet (75 mg total) by mouth daily. (Patient not taking: Reported on 04/26/2021) 90 tablet 3   cyanocobalamin 100 MCG tablet Take 1,000 mcg by mouth daily. (Patient not taking: Reported on 04/26/2021)     furosemide (LASIX) 20 MG tablet TAKE 1 TABLET BY MOUTH AS NEEDED FOR FLUID OR EDEMA (Patient not taking: Reported on 04/26/2021) 30 tablet 3   loratadine (CLARITIN) 10 MG tablet Take 10 mg by mouth daily as needed for allergies or rhinitis. (Patient not taking: Reported on 04/26/2021)     meloxicam (MOBIC) 15 MG tablet Take 1 tablet (15 mg total) by mouth daily as needed. for pain (Patient not taking: Reported on 04/26/2021) 90 tablet 3   metFORMIN (GLUCOPHAGE) 500 MG tablet Take 1 tablet (500 mg  total) by mouth daily with breakfast. (Patient not taking: Reported on 04/26/2021) 90 tablet 3   potassium chloride SA (KLOR-CON) 20 MEQ tablet Take 1 tablet (20 mEq total) by mouth daily. (Patient not taking: Reported on 04/26/2021) 30 tablet 11   tiZANidine (ZANAFLEX) 4 MG tablet TAKE 1 TABLET BY MOUTH  EVERY 8 HOURS AS NEEDED FOR MUSCLE SPASM(S) (Patient not taking: Reported on 04/26/2021) 270 tablet 0   traMADol (ULTRAM) 50 MG tablet TAKE 1 TO 2 TABLETS BY MOUTH TWICE DAILY AS NEEDED (Patient not taking: Reported on 04/26/2021) 100 tablet 3   traZODone (DESYREL) 50 MG tablet TAKE 1/2 TO 1 TABLETS BY  MOUTH AT BEDTIME AS NEEDED  FOR SLEEP. (Patient not taking: Reported on 04/26/2021) 90 tablet 3   No facility-administered medications prior to visit.    ROS: Review of Systems  Constitutional:  Negative for appetite change, fatigue and unexpected weight change.  HENT:  Negative for congestion, nosebleeds, sneezing, sore throat and trouble swallowing.   Eyes:  Negative for itching and visual disturbance.  Respiratory:  Negative for cough.   Cardiovascular:  Negative for chest pain, palpitations and leg swelling.  Gastrointestinal:  Positive for diarrhea. Negative for abdominal distention, blood in stool and nausea.  Genitourinary:  Negative for frequency and hematuria.  Musculoskeletal:  Negative for back pain, gait problem, joint swelling and neck  pain.  Skin:  Negative for rash.  Neurological:  Negative for dizziness, tremors, speech difficulty and weakness.  Psychiatric/Behavioral:  Negative for agitation, dysphoric mood and sleep disturbance. The patient is not nervous/anxious.    Objective:  BP (!) 160/82 (BP Location: Left Arm)   Pulse (!) 54   Temp 98.1 F (36.7 C) (Oral)   Ht 6' 3.5" (1.918 m)   Wt 228 lb (103.4 kg)   SpO2 98%   BMI 28.12 kg/m   BP Readings from Last 3 Encounters:  04/26/21 (!) 160/82  03/29/21 138/72  12/15/20 138/78    Wt Readings from Last 3 Encounters:   04/26/21 228 lb (103.4 kg)  03/29/21 229 lb 6.4 oz (104.1 kg)  12/15/20 231 lb 6.4 oz (105 kg)    Physical Exam Constitutional:      General: He is not in acute distress.    Appearance: He is well-developed.     Comments: NAD  Eyes:     Conjunctiva/sclera: Conjunctivae normal.     Pupils: Pupils are equal, round, and reactive to light.  Neck:     Thyroid: No thyromegaly.     Vascular: No JVD.  Cardiovascular:     Rate and Rhythm: Normal rate and regular rhythm.     Heart sounds: Normal heart sounds. No murmur heard.   No friction rub. No gallop.  Pulmonary:     Effort: Pulmonary effort is normal. No respiratory distress.     Breath sounds: Normal breath sounds. No wheezing or rales.  Chest:     Chest wall: No tenderness.  Abdominal:     General: Bowel sounds are normal. There is no distension.     Palpations: Abdomen is soft. There is no mass.     Tenderness: There is no abdominal tenderness. There is no guarding or rebound.  Musculoskeletal:        General: No tenderness. Normal range of motion.     Cervical back: Normal range of motion.  Lymphadenopathy:     Cervical: No cervical adenopathy.  Skin:    General: Skin is warm and dry.     Findings: No rash.  Neurological:     Mental Status: He is alert and oriented to person, place, and time.     Cranial Nerves: No cranial nerve deficit.     Motor: No abnormal muscle tone.     Coordination: Coordination normal.     Gait: Gait normal.     Deep Tendon Reflexes: Reflexes are normal and symmetric.  Psychiatric:        Behavior: Behavior normal.        Thought Content: Thought content normal.        Judgment: Judgment normal.     Procedure: EKG Indication: syncope Impression: S brady. LAFB. No new changes.  Lab Results  Component Value Date   WBC 10.0 08/07/2020   HGB 10.6 (L) 08/07/2020   HCT 31.2 (L) 08/07/2020   PLT 122 (L) 08/07/2020   GLUCOSE 77 03/29/2021   CHOL 104 10/19/2020   TRIG 65 10/19/2020    HDL 45 10/19/2020   LDLDIRECT 88.1 01/23/2011   LDLCALC 45 10/19/2020   ALT 7 03/29/2021   AST 13 03/29/2021   NA 138 03/29/2021   K 4.3 03/29/2021   CL 104 03/29/2021   CREATININE 1.17 03/29/2021   BUN 13 03/29/2021   CO2 27 03/29/2021   TSH 0.673 08/04/2020   PSA 0.26 07/30/2020   INR 1.5 (H) 08/04/2020   HGBA1C  5.9 03/29/2021   MICROALBUR 2.0 (H) 03/29/2021    No results found.  Assessment & Plan:     Walker Kehr, MD

## 2021-05-05 ENCOUNTER — Other Ambulatory Visit: Payer: Self-pay | Admitting: Internal Medicine

## 2021-05-13 ENCOUNTER — Other Ambulatory Visit: Payer: Self-pay

## 2021-05-13 ENCOUNTER — Ambulatory Visit (INDEPENDENT_AMBULATORY_CARE_PROVIDER_SITE_OTHER): Payer: Medicare Other | Admitting: Internal Medicine

## 2021-05-13 ENCOUNTER — Encounter: Payer: Self-pay | Admitting: Internal Medicine

## 2021-05-13 DIAGNOSIS — M2550 Pain in unspecified joint: Secondary | ICD-10-CM | POA: Insufficient documentation

## 2021-05-13 DIAGNOSIS — R55 Syncope and collapse: Secondary | ICD-10-CM | POA: Diagnosis not present

## 2021-05-13 DIAGNOSIS — L910 Hypertrophic scar: Secondary | ICD-10-CM | POA: Insufficient documentation

## 2021-05-13 NOTE — Patient Instructions (Signed)
Treatment options Intralesional corticosteroids -- Intralesional triamcinolone acetonide at a concentration of 10 to 40 mg/mL is the most commonly used treatment for hypertrophic scars and keloids [36]. (See "Intralesional corticosteroid injection", section on 'Injection techniques'.) Corticosteroids soften and flatten the scar by diminishing collagen and glycosaminoglycan synthesis and by inhibiting fibroblast proliferation. Due to their antiinflammatory and vasoconstrictive properties, intralesional corticosteroids are effective in reducing pain and pruritus. Treatment is usually repeated several times at four to six-week intervals, but the optimalconcentration and number of treatments has not been determined. Data from observational studies indicate response rates to intralesional corticosteroids of 50 to 100 percent, with a recurrence rate of up to 50 percent at five years [37,38]. In a series of 94 patients with 109 keloids treated with intralesional triamcinolone acetonide 1 to 10 mg/mL every four weeks, 83 percent of patients receiving more than 10 injections reported reduction of itching and pain [39]. Excellent or good improvement of prominence and redness was observed in 39percent of patients who received 20 to 30 injections over three to five years. Intralesional corticosteroid injections are painful. Other adverse effects include dermal atrophy, skin ulceration, hypo- or hyperpigmentation, anddevelopment of telangiectasias. Intralesional fluorouracil -- Intralesional fluorouracil (FU) has been used for scars that do not respond to intralesional corticosteroids [40,41]. Intralesional FU appears to induce fibroblast apoptosis without necrosis and inhibit transforming growth factor (TGF)-beta signaling in the production of collagen type I [40]. In one randomized study, 40 patients were treated with intralesional triamcinolone acetonide 40 mg/mL or FU 50 mg/mL tattooing every four weeks for a total of  three treatments [42]. After 44 weeks, both groups showed a reduction in height, surface area, erythema, and induration of lesions, with good to excellent response reported in 95 percent of patients in the FU group versus 50 percent in the triamcinolone group. Patient satisfaction with treatment was greater in the FU group than in the triamcinolone group, with 85 and 40 percent of patientsrating the response to treatment as good or excellent, respectively. A 2015 systematic review of 18 small randomized trials and case series (482 patients) found that a good or excellent outcome was achieved by 45 to 78 percent of patients treated with intralesional FU alone and 50 to 96 percent of those treated with intralesional FU and triamcinolone acetonide (0.9 mL of FU 50 mg/mL with 0.1 mL triamcinolone 40 mg/mL) [43]. All studies were of poor quality and seven did not report recurrence rates. Recurrence rates of 25 to 47 percent were found in studies with a follow-up ofone year or longer. Adverse effects of intralesional FU include pain and hyperpigmentation. Intralesional FU can be used in combination with intralesional corticosteroids [44,45]. Silicone gel sheets -- Silicone gel sheeting is frequently used for the treatment and prevention of hypertrophic scars and keloids [46-49]. The mechanism by which silicone gel sheeting might exert an anti-scarring effect is unknown, but may be related to occlusion and hydration of the stratum corneum, generation of static electricity, or reduction of mast cells [46,47,50,51]. Fluid silicone gel formulations have been developed as an alternative to silicone sheets for use on exposed or large areas. The silicone gel is applied to the scar area in a thin layer that dries forming a transparent, flexible,gas permeable but water impermeable silicone sheet. Silicone gel sheeting and silicone gel are available by prescription and over-the-counter. The sheeting is clear and sticky and  should be cut to fit thesize of the keloid and fixed with tape. A 2013 Cochrane systematic review of 20 controlled trials  including 873 patients found some evidence that silicone gel sheeting may reduce the thickness and improve the appearance of hypertrophic scars and keloids, but concluded that any estimate of effect was uncertain because the underlying trials were of poor quality and highly susceptible to bias [52]. Pressure therapy -- Pressure therapy is usually performed with pressure garments, bandages, or special devices for certain locations such as the ear. A type of pressure earrings for earlobe keloids called Zimmer splints can be molded to the appropriate size and cosmetically altered to appear as earrings [53]. Other devices using magnets with or without silicone sheeting have also been used as post-surgery adjuvant therapy for ear keloids [54-56]. Several types of pressure earrings are commercially available (eg, Delascoacrylic pressure earrings). The mechanism of action of pressure may involve the reduction of oxygen tension in the wound through the occlusion of small blood vessels, resulting in a decreased fibroblast proliferation and collagen synthesis. However, the optimal amount of pressure is difficult to determine. It should exceed the inherent capillary pressure without diminishing the peripheral blood circulation (20 to 30 mmHg). In one study, the applied pressure was 35 mmHg, which was estimated using a digital manometer [55]. The evidence to support the use of pressure therapy is limited. A 2009 meta-analysis of six high quality randomized trials including 316 burn patients did not demonstrate a difference in the global scar assessment between patients treated with pressure garments and untreated patients [57]. Observational studies of patients with ear keloids have shown that custom-made pressure devices may be beneficial to reduce the risk of recurrence after surgical excision  [53-55,58]. Cryotherapy -- Cryotherapy is most useful in combination with other treatments for keloids [59,60], although up to 50 percent of patients may respond to cryotherapy alone [37,61,62]. A 10- to 30-second freeze-thaw cycle is used and can be repeated up to three times per treatment session. Treatment is repeated at intervals of four to six weeks until response occurs. The major side effects are pain and permanent hypopigmentation, the latter of which limits its use in patients with darkerskin. Intralesional cryotherapy is a newer technique that allows the focused destruction of keloid scar tissue with minimal surface damage to the epidermis [63,64]. Delivery of liquid nitrogen to the scar tissue is performed under local anesthesia using a special cryosurgery needle probe consisting of a long, uninsulated, double-lumen cryo-needle equipped with a sharp distal tip, cryogen vent, and adaptor to a cryogen source. The freezing time may vary, depending onthe keloid size, but is typically longer than external cryotherapy. Following a single intralesional cryotherapy session, mean scar volume reductions ranging from 50 to over 60 percent and improvement of pain and itching have been reported [65-67]. Treatments can be repeated at two- to three-week intervals. In contrast with conventional cryotherapy, postinflammatory hypopigmentation is infrequent after intralesional cryotherapy and occurs most frequently in patients with darker skin types [67,68]. The reported recurrence rates after intralesional cryotherapy range from 0 to 24 percent [67]. Surgical excision and perioperative therapies -- Surgical excision of hypertrophic scars and keloids may be indicated if conservative therapies alone are unsuccessful or unlikely to result in significant improvement. Surgical excision of keloids is associated with recurrence rates of up to 100 percent [59]. The combination of surgery with adjunctive perioperative therapies  cansignificantly lower the risk of recurrence: ?Intralesional corticosteroids - Surgical excision combined with preoperative, intraoperative, and postoperative intralesional injection triamcinolone acetonide has been reported in several small, uncontrolled studies using various dosages, schedules, and concentrations of the drug [69-74]. In all reports,  the perioperative intralesional injection resulted in a markedly reduced recurrence rate. In a series of 39 pediatric patients with 135 earlobe keloids, the recurrence rate for those treated with surgical excision combined with intralesional corticosteroids was 29 percent, with a median time to recurrence of 23 months [75].  ?Intralesional fluorouracil (FU) - In one study, 80 patients with keloids of one- to four-year duration underwent surgical excision followed by a single injection of FU and botulinum toxin eight days postsurgery [76]. Recurrence occurred in three (4 percent) patients after a follow-up time of17 to 24 months.  ?Cryotherapy - Surgical excision followed by immediate freezing of the open wound was performed in one study of 66 patients with 97 large ear keloids [77]. After a median follow-up time of 12 months, 36 percent of lesions recurredand required further treatment.  ?Imiquimod - A few small observational studies have reported that postoperative use of imiquimod with daily or alternate day applications may reduce the rate of recurrence of keloids [78,79]. However, other studies have provided conflicting results 0000000.

## 2021-05-13 NOTE — Assessment & Plan Note (Addendum)
R forearm post-op Cryo, inj w/steroids options discussed Mederma to try

## 2021-05-13 NOTE — Assessment & Plan Note (Signed)
Aches and pains are a lot better on QOD Lipitor

## 2021-05-13 NOTE — Progress Notes (Signed)
Subjective:  Patient ID: Brian Mccullough, male    DOB: 19-Jul-1951  Age: 70 y.o. MRN: [id number]  CC: Follow-up (2 week f/u)   HPI [name] presents for dyslipidemia, HTN, syncope Aches and pains are a lot better on QOD Lipitor  Outpatient Medications Prior to Visit  Medication Sig Dispense Refill   aspirin 81 MG EC tablet Take 81 mg by mouth daily.     atorvastatin (LIPITOR) 40 MG tablet Take 1 tablet (40 mg total) by mouth daily. 90 tablet 3   benazepril (LOTENSIN) 20 MG tablet Take 1 tablet (20 mg total) by mouth daily. 90 tablet 3   Cholecalciferol (VITAMIN D3) 1000 UNITS tablet Take 1,000 Units by mouth daily.     clopidogrel (PLAVIX) 75 MG tablet Take 1 tablet (75 mg total) by mouth daily. 90 tablet 3   cyanocobalamin 100 MCG tablet Take 1,000 mcg by mouth daily.     furosemide (LASIX) 20 MG tablet TAKE 1 TABLET BY MOUTH AS NEEDED FOR FLUID OR EDEMA 30 tablet 3   loratadine (CLARITIN) 10 MG tablet Take 10 mg by mouth daily as needed for allergies or rhinitis.     meloxicam (MOBIC) 15 MG tablet TAKE 1 TABLET BY MOUTH  DAILY AS NEEDED FOR PAIN 90 tablet 1   metFORMIN (GLUCOPHAGE) 500 MG tablet Take 1 tablet (500 mg total) by mouth daily with breakfast. 90 tablet 3   metoprolol tartrate (LOPRESSOR) 25 MG tablet Take 12.5 mg by mouth daily.     potassium chloride SA (KLOR-CON) 20 MEQ tablet Take 1 tablet (20 mEq total) by mouth daily. 30 tablet 11   tiZANidine (ZANAFLEX) 4 MG tablet TAKE 1 TABLET BY MOUTH  EVERY 8 HOURS AS NEEDED FOR MUSCLE SPASM(S) (Patient taking differently: TAKE 1 TABLET BY MOUTH  EVERY 8 HOURS AS NEEDED FOR MUSCLE SPASM(S)) 270 tablet 0   traMADol (ULTRAM) 50 MG tablet TAKE 1 TO 2 TABLETS BY MOUTH TWICE DAILY AS NEEDED 100 tablet 3   traZODone (DESYREL) 50 MG tablet TAKE 1/2 TO 1 TABLETS BY  MOUTH AT BEDTIME AS NEEDED  FOR SLEEP. (Patient taking differently: TAKE 1/2 TO 1 TABLETS BY  MOUTH AT BEDTIME AS NEEDED  FOR SLEEP.) 90 tablet 3   No  facility-administered medications prior to visit.    ROS: Review of Systems  Constitutional:  Negative for appetite change, fatigue and unexpected weight change.  HENT:  Negative for congestion, nosebleeds, sneezing, sore throat and trouble swallowing.   Eyes:  Negative for itching and visual disturbance.  Respiratory:  Negative for cough.   Cardiovascular:  Negative for chest pain, palpitations and leg swelling.  Gastrointestinal:  Negative for abdominal distention, blood in stool, diarrhea and nausea.  Genitourinary:  Negative for frequency and hematuria.  Musculoskeletal:  Positive for arthralgias. Negative for back pain, gait problem, joint swelling and neck pain.  Skin:  Negative for rash.  Neurological:  Negative for dizziness, tremors, syncope, speech difficulty and weakness.  Psychiatric/Behavioral:  Negative for agitation, dysphoric mood and sleep disturbance. The patient is not nervous/anxious.    Objective:  BP 132/70 (BP Location: Left Arm)   Pulse (!) 52   Temp 98 F (36.7 C) (Oral)   Wt 233 lb (105.7 kg)   SpO2 98%   BMI 28.74 kg/m   BP Readings from Last 3 Encounters:  05/13/21 132/70  04/26/21 (!) 160/82  03/29/21 138/72    Wt Readings from Last 3 Encounters:  05/13/21 233 lb (105.7 kg)  04/26/21 228 lb (103.4 kg)  03/29/21 229 lb 6.4 oz (104.1 kg)    Physical Exam Constitutional:      General: He is not in acute distress.    Appearance: He is well-developed.     Comments: NAD  Eyes:     Conjunctiva/sclera: Conjunctivae normal.     Pupils: Pupils are equal, round, and reactive to light.  Neck:     Thyroid: No thyromegaly.     Vascular: No JVD.  Cardiovascular:     Rate and Rhythm: Normal rate and regular rhythm.     Heart sounds: Normal heart sounds. No murmur heard.   No friction rub. No gallop.  Pulmonary:     Effort: Pulmonary effort is normal. No respiratory distress.     Breath sounds: Normal breath sounds. No wheezing or rales.  Chest:      Chest wall: No tenderness.  Abdominal:     General: Bowel sounds are normal. There is no distension.     Palpations: Abdomen is soft. There is no mass.     Tenderness: There is no abdominal tenderness. There is no guarding or rebound.  Musculoskeletal:        General: No tenderness. Normal range of motion.     Cervical back: Normal range of motion.  Lymphadenopathy:     Cervical: No cervical adenopathy.  Skin:    General: Skin is warm and dry.     Findings: No rash.  Neurological:     Mental Status: He is alert and oriented to person, place, and time.     Cranial Nerves: No cranial nerve deficit.     Motor: No abnormal muscle tone.     Coordination: Coordination normal.     Gait: Gait normal.     Deep Tendon Reflexes: Reflexes are normal and symmetric.  Psychiatric:        Behavior: Behavior normal.        Thought Content: Thought content normal.        Judgment: Judgment normal.    Lab Results  Component Value Date   WBC 3.4 (L) 04/26/2021   HGB 14.4 04/26/2021   HCT 42.8 04/26/2021   PLT 178.0 04/26/2021   GLUCOSE 98 04/26/2021   CHOL 104 10/19/2020   TRIG 65 10/19/2020   HDL 45 10/19/2020   LDLDIRECT 88.1 01/23/2011   LDLCALC 45 10/19/2020   ALT 7 03/29/2021   AST 13 03/29/2021   NA 138 04/26/2021   K 4.2 04/26/2021   CL 103 04/26/2021   CREATININE 0.97 04/26/2021   BUN 11 04/26/2021   CO2 28 04/26/2021   TSH 0.673 08/04/2020   PSA 0.26 07/30/2020   INR 1.5 (H) 08/04/2020   HGBA1C 5.9 03/29/2021   MICROALBUR 2.0 (H) 03/29/2021    No results found.  Assessment & Plan:     Walker Kehr, MD

## 2021-05-13 NOTE — Assessment & Plan Note (Signed)
No relapse - doing well on all his BP meds now

## 2021-06-02 ENCOUNTER — Other Ambulatory Visit: Payer: Self-pay | Admitting: Internal Medicine

## 2021-06-03 ENCOUNTER — Telehealth: Payer: Self-pay | Admitting: Lab

## 2021-06-03 NOTE — Progress Notes (Signed)
  Chronic Care Management   Outreach Note  06/03/2021 Name: Brian Mccullough MRN: [id number] DOB: 1951-04-08  Referred by: Cassandria Anger, MD Reason for referral : Medication Management   An unsuccessful telephone outreach was attempted today. The patient was referred to the pharmacist for assistance with care management and care coordination.   Follow Up Plan:   [name]

## 2021-06-03 NOTE — Chronic Care Management (AMB) (Signed)
  Chronic Care Management   Note  06/03/2021 Name: Brian Mccullough MRN: [id number] DOB: 1951/03/25  Brian Mccullough is a 70 y.o. year old male who is a primary care patient of [name], Evie Lacks, MD. I reached out to Brian Mccullough by phone today in response to a referral sent by [name]'s PCP, [name], Evie Lacks, MD.   [name] was given information about Chronic Care Management services today including:  CCM service includes personalized support from designated clinical staff supervised by his physician, including individualized plan of care and coordination with other care providers 24/7 contact phone numbers for assistance for urgent and routine care needs. Service will only be billed when office clinical staff spend 20 minutes or more in a month to coordinate care. Only one practitioner may furnish and bill the service in a calendar month. The patient may stop CCM services at any time (effective at the end of the month) by phone call to the office staff.   Patient agreed to services and verbal consent obtained.   Follow up plan:   [place]

## 2021-06-07 ENCOUNTER — Other Ambulatory Visit: Payer: Self-pay | Admitting: Physician Assistant

## 2021-06-07 ENCOUNTER — Other Ambulatory Visit: Payer: Self-pay | Admitting: Internal Medicine

## 2021-06-20 ENCOUNTER — Other Ambulatory Visit: Payer: Medicare Other | Admitting: *Deleted

## 2021-06-20 ENCOUNTER — Other Ambulatory Visit: Payer: Self-pay

## 2021-06-20 DIAGNOSIS — E782 Mixed hyperlipidemia: Secondary | ICD-10-CM

## 2021-06-20 LAB — HEPATIC FUNCTION PANEL
ALT: 6 IU/L (ref 0–44)
AST: 10 IU/L (ref 0–40)
Albumin: 4 g/dL (ref 3.8–4.8)
Alkaline Phosphatase: 73 IU/L (ref 44–121)
Bilirubin Total: 0.5 mg/dL (ref 0.0–1.2)
Bilirubin, Direct: 0.15 mg/dL (ref 0.00–0.40)
Total Protein: 6.6 g/dL (ref 6.0–8.5)

## 2021-06-20 LAB — LIPID PANEL
Chol/HDL Ratio: 3.4 ratio (ref 0.0–5.0)
Cholesterol, Total: 131 mg/dL (ref 100–199)
HDL: 38 mg/dL — ABNORMAL LOW (ref >39)
LDL Chol Calc (NIH): 69 mg/dL (ref 0–99)
Triglycerides: 135 mg/dL (ref 0–149)
VLDL Cholesterol Cal: 24 mg/dL (ref 5–40)

## 2021-06-30 NOTE — Progress Notes (Signed)
Cardiology Office Note:    Date:  07/01/2021   ID:  Brian Mccullough, DOB 1951-05-05, MRN [id number]  PCP:  Cassandria Anger, MD  [facility] Providers Cardiologist:  Sherren Mocha, MD Cardiology APP:  Sharmon Revere     Referring MD: Cassandria Anger, MD   Chief Complaint:  F/u for CAD and Palpitations    Patient Profile:   Brian Mccullough is a 70 y.o. male with:  Coronary artery disease S/p Inf STEMI 10/21 >> PCI: POBA to RCA S/p CABG 10/21 (L-LAD, S-PDA, L radial-OM1/RI, S-D1, RIMA-RPLA) Post AFib >> Amiodarone  Carotid artery disease [place] 10/21: R 1-39; L 40-59 Diabetes mellitus  Hypertension  Hyperlipidemia  SVT     Prior CV studies: Cardiac catheterization 08-23-2020 LM mid 70 LAD prox 80; D1 80 RI 75 LCx prox 70 RCA prox 95, mid 75; RPDA 90, 100 EF 50-55. Inf HK PCI:  POBA to prox RCA    Echocardiogram 08/04/20 EF 55-60, no RWMA, GLS -18.5, normal RVSF, mild LAE, trivial MR   Carotid US 08/04/20 R 1-39, L 40-59   History of Present Illness: [name] was last seen in 3/22.  He returns for Cardiology f/u.  He is here alone.  He has continued to have occasional heart palpitations.  These often feel like a skipping sensation.  Sometimes he has a rapid sensation.  He had an episode of syncope in July.  He was dehydrated and eventually learned she had COVID.  He has not had any further episodes.  He has not had chest discomfort, shortness of breath, significant leg edema.  He notes that his palpitations generally occur when he is seated at rest.  He has no issues with exercise.  He does drink a lot of soft drinks.        Past Medical History:  Diagnosis Date   Allergy    CAD (coronary artery disease)    s/p Inf STEMI 10/21>>POBA to RCA >> s/p CABG (L-LAD, S-PDA, L Radial-OM1/RI, RIMA-RPLA) // post op AF; SVT >> Amiod Rx // Echo 10/21: EF 55-60, no RWMA, GLS -18.5, normal RVSF, mild LAE, trivial MR    Carotid artery disease ([place])    [place] 10/21: R  1-39; L 40-59   Hyperactive gag reflex    Hypertension    under control with meds., has been on med. x 30 yr.   Inguinal hernia 01/2014   bilateral   LBP (low back pain)    Non-insulin dependent type 2 diabetes mellitus (HCC)    OA (osteoarthritis) of knee 01/2004   left   Palpitations    s/p Inf STEMI 07/2020   POBA to RCA >> CABG   Vitamin B 12 deficiency    Current Medications: Current Meds  Medication Sig   aspirin 81 MG EC tablet Take 81 mg by mouth daily.   atorvastatin (LIPITOR) 40 MG tablet Take 1 tablet (40 mg total) by mouth daily.   benazepril (LOTENSIN) 20 MG tablet TAKE 1 TABLET BY MOUTH  DAILY   Cholecalciferol (VITAMIN D3) 1000 UNITS tablet Take 1,000 Units by mouth daily.   clopidogrel (PLAVIX) 75 MG tablet Take 1 tablet (75 mg total) by mouth daily.   cyanocobalamin 100 MCG tablet Take 1,000 mcg by mouth daily.   furosemide (LASIX) 20 MG tablet TAKE 1 TABLET BY MOUTH AS NEEDED FOR FLUID OR EDEMA   loratadine (CLARITIN) 10 MG tablet Take 10 mg by mouth daily as needed for allergies or  rhinitis.   meloxicam (MOBIC) 15 MG tablet TAKE 1 TABLET BY MOUTH  DAILY AS NEEDED FOR PAIN   metFORMIN (GLUCOPHAGE) 500 MG tablet TAKE 1 TABLET BY MOUTH  DAILY WITH BREAKFAST   metoprolol tartrate (LOPRESSOR) 25 MG tablet Take 1 tablet (25 mg total) by mouth 2 (two) times daily.   potassium chloride SA (KLOR-CON) 20 MEQ tablet TAKE 1 TABLET BY MOUTH  DAILY   tiZANidine (ZANAFLEX) 4 MG tablet TAKE 1 TABLET BY MOUTH  EVERY 8 HOURS AS NEEDED FOR MUSCLE SPASM(S)   traMADol (ULTRAM) 50 MG tablet TAKE 1 TO 2 TABLETS BY MOUTH TWICE DAILY AS NEEDED   traZODone (DESYREL) 50 MG tablet TAKE 1/2 TO 1 TABLET BY  MOUTH AT BEDTIME AS NEEDED  FOR SLEEP   [DISCONTINUED] metoprolol tartrate (LOPRESSOR) 50 MG tablet Take 50 mg by mouth daily.    Allergies:   Codeine   Social History   Tobacco Use   Smoking status: Former    Types: Cigars    Quit date: 08/03/2020    Years since quitting: 0.9    Smokeless tobacco: Never   Tobacco comments:    2-3 small cigars/day  Vaping Use   Vaping Use: Never used  Substance Use Topics   Alcohol use: No   Drug use: No    Family Hx: The patient's family history includes Diabetes in his father and mother; Hypertension in his mother. There is no history of Colon cancer, Esophageal cancer, Rectal cancer, or Stomach cancer.  ROS   EKGs/Labs/Other Test Reviewed:    EKG:  EKG is   ordered today.  The ekg ordered today demonstrates sinus bradycardia, HR 50, leftward axis, inferior T wave inversions, PAC, QTC 393  Recent Labs: 08/04/2020: B Natriuretic Peptide 529.0; TSH 0.673 08/05/2020: Magnesium 2.1 04/26/2021: BUN 11; Creatinine, Ser 0.97; Hemoglobin 14.4; Platelets 178.0; Potassium 4.2; Sodium 138 06/20/2021: ALT 6   Recent Lipid Panel Lab Results  Component Value Date/Time   CHOL 131 06/20/2021 10:57 AM   TRIG 135 06/20/2021 10:57 AM   HDL 38 (L) 06/20/2021 10:57 AM   LDLCALC 69 06/20/2021 10:57 AM   LDLDIRECT 88.1 01/23/2011 08:29 AM     Risk Assessment/Calculations:          Physical Exam:    VS:  BP 120/60   Pulse (!) 50   Ht 6\' 3"  (1.905 m)   Wt 237 lb 12.8 oz (107.9 kg)   SpO2 96%   BMI 29.72 kg/m     Wt Readings from Last 3 Encounters:  07/01/21 237 lb 12.8 oz (107.9 kg)  05/13/21 233 lb (105.7 kg)  04/26/21 228 lb (103.4 kg)    Constitutional:      Appearance: Healthy appearance. Not in distress.  Neck:     Vascular: JVD normal.  Pulmonary:     Effort: Pulmonary effort is normal.     Breath sounds: No wheezing. No rales.  Cardiovascular:     Normal rate. Irregular rhythm. Normal S1. Normal S2.      Murmurs: There is no murmur.  Edema:    Peripheral edema absent.  Abdominal:     Palpations: Abdomen is soft.  Skin:    General: Skin is warm and dry.  Neurological:     General: No focal deficit present.     Mental Status: Alert and oriented to person, place and time.     Cranial Nerves: Cranial nerves  are intact.       ASSESSMENT & PLAN:  1. Coronary artery disease involving native coronary artery of native heart without angina pectoris S/p  inferior STEMI 10/2021treated with angioplasty of the RCA and followed by CABG. he is doing well without angina.  He will be 1 year out from his myocardial infarction in October.  Therefore, I have asked him to stop his Plavix after October.  He can remain on aspirin 81 mg daily.  Continue atorvastatin 40 mg daily, metoprolol tartrate, benazepril 20 mg daily.  Follow-up in 6 months.  2. Postoperative atrial fibrillation (HCC) No apparent recurrence.  He remained on amiodarone for some time after surgery and this has been discontinued.  3. Essential hypertension Blood pressure is well controlled.  Continue benazepril 20 mg daily, metoprolol tartrate.  4. Mixed hyperlipidemia Lipids are well controlled on atorvastatin 40 mg daily.  He does note arthralgias.  We discussed the potential for changing atorvastatin to rosuvastatin.  We also discussed the addition of ezetimibe or PCSK9 inhibitors.  He prefers to continue his current therapy for now.  5. Premature atrial contractions He is having symptoms from PACs.  I have asked him to reduce caffeine intake.  He increase metoprolol tartrate on his own to 50 mg once daily.  I have asked him to change this to 25 mg twice daily.  If reducing caffeine and changing his beta-blocker do not improve his symptoms, he knows to contact me.  At that point, I would obtain a 2-week ZIO monitor.   Dispo:  Return in about 6 months (around 12/29/2021) for Routine Follow Up, w/ Dr. Burt Knack, or Richardson Dopp, PA-C.   Medication Adjustments/Labs and Tests Ordered: Current medicines are reviewed at length with the patient today.  Concerns regarding medicines are outlined above.  Tests Ordered: Orders Placed This Encounter  Procedures   EKG 12-Lead    Medication Changes: Meds ordered this encounter  Medications   metoprolol  tartrate (LOPRESSOR) 25 MG tablet    Sig: Take 1 tablet (25 mg total) by mouth 2 (two) times daily.    Dispense:  180 tablet    Refill:  3    Signed, Richardson Dopp, PA-C  07/01/2021 8:44 AM    [facility] [address], [place], [place]  [address] Phone: [phone]; Fax: [phone]

## 2021-07-01 ENCOUNTER — Ambulatory Visit: Payer: Medicare Other | Admitting: Physician Assistant

## 2021-07-01 ENCOUNTER — Encounter: Payer: Self-pay | Admitting: Physician Assistant

## 2021-07-01 ENCOUNTER — Other Ambulatory Visit: Payer: Self-pay

## 2021-07-01 VITALS — BP 120/60 | HR 50 | Ht 75.0 in | Wt 237.8 lb

## 2021-07-01 DIAGNOSIS — I9789 Other postprocedural complications and disorders of the circulatory system, not elsewhere classified: Secondary | ICD-10-CM

## 2021-07-01 DIAGNOSIS — I4891 Unspecified atrial fibrillation: Secondary | ICD-10-CM

## 2021-07-01 DIAGNOSIS — E782 Mixed hyperlipidemia: Secondary | ICD-10-CM

## 2021-07-01 DIAGNOSIS — I491 Atrial premature depolarization: Secondary | ICD-10-CM | POA: Diagnosis not present

## 2021-07-01 DIAGNOSIS — I10 Essential (primary) hypertension: Secondary | ICD-10-CM

## 2021-07-01 DIAGNOSIS — I2510 Atherosclerotic heart disease of native coronary artery without angina pectoris: Secondary | ICD-10-CM

## 2021-07-01 MED ORDER — METOPROLOL TARTRATE 25 MG PO TABS: 25.0000 mg | ORAL_TABLET | Freq: Two times a day (BID) | ORAL | 3 refills | Status: DC

## 2021-07-01 NOTE — Patient Instructions (Addendum)
Medication Instructions:  Your physician has recommended you make the following change in your medication: Change your Metoprolol to 25 mg twice daily  MAY STOP PLAVIX IN October 2022 BUT CONTINUE TAKING YOUR ASPIRIN 81 MG   *If you need a refill on your cardiac medications before your next appointment, please call your pharmacy*   Lab Work: none If you have labs (blood work) drawn today and your tests are completely normal, you will receive your results only by: [place] (if you have MyChart) OR A paper copy in the mail If you have any lab test that is abnormal or we need to change your treatment, we will call you to review the results.   Testing/Procedures: non   Follow-Up: At [facility], you and your health needs are our priority.  As part of our continuing mission to provide you with exceptional heart care, we have created designated Provider Care Teams.  These Care Teams include your primary Cardiologist (physician) and Advanced Practice Providers (APPs -  Physician Assistants and Nurse Practitioners) who all work together to provide you with the care you need, when you need it.  We recommend signing up for the patient portal called "MyChart".  Sign up information is provided on this After Visit Summary.  MyChart is used to connect with patients for Virtual Visits (Telemedicine).  Patients are able to view lab/test results, encounter notes, upcoming appointments, etc.  Non-urgent messages can be sent to your provider as well.   To learn more about what you can do with MyChart, go to [email or web address].    Your next appointment:   6 month(s)  The format for your next appointment:   In Person  Provider:   You may see Sherren Mocha, MD or one of the following Advanced Practice Providers on your designated Care Team:   Richardson Dopp, PA-C Vin Newman, [place]   Other Instructions:   Decrease your caffeine intake     Premature Atrial Contraction A premature  atrial contraction [facility]) is a kind of irregular heartbeat (arrhythmia). It happens when the heart beats too early and then pauses before beating again. The heart has four areas, or chambers. Normally, electrical signals spread across the heart and make all the chambers beat together. During a PAC, the upper chambers of the heart (atria) beat too early, before they have had time to fill with blood. The heartbeat pauses afterward so the heart can fill with blood for the next beat. Sometimes PAC can be a warning sign of another type of arrhythmia called atrial fibrillation. Atrial fibrillation may allow blood to pool in the atria and form clots. If a clot travels to the brain, it can cause a stroke. What are the causes? The cause of this condition is often unknown. Sometimes, this condition may be caused by heart disease or injury to the heart. What increases the risk? You are more likely to develop this condition if: You are a child. You are an adult who is 22 years of age or older. Episodes may be triggered by: Caffeine. Alcohol. Tobacco use. Stimulant drugs. Some medicines or supplements. Stress. Heart disease. What are the signs or symptoms? Symptoms of this condition include: A feeling that your heart skipped a beat. The first heartbeat after the "skipped" beat may feel more forceful. A feeling that your heart is fluttering. How is this diagnosed? This condition is diagnosed based on: Your symptoms. A physical exam. Your health care provider may listen to your heart. An electrocardiogram (ECG). This  is a test that records the electrical impulses of the heart. An ambulatory cardiac monitor. This device records your heartbeats for 24 hours or more. You may also have: An echocardiogram to check for any heart conditions. This is a type of imaging test that uses sound waves (ultrasound) to make images of your heart. Blood tests. How is this treated? Treatment depends on the frequency of  your symptoms and other risk factors. Treatments may include: Medicines (beta-blockers). Catheter ablation. This is done to destroy the part of the heart tissue that sends abnormal signals. In some cases, treatment may not be needed for this condition. Follow these instructions at home: Lifestyle Do not use any products that contain nicotine or tobacco, such as cigarettes, e-cigarettes, and chewing tobacco. If you need help quitting, ask your health care provider. Exercise regularly. Ask your health care provider what type of exercise is safe for you. Find healthy ways to manage stress. Try to get at least 7-9 hours of sleep each night, or as much as recommended by your health care provider. Alcohol use Do not drink alcohol if: Your health care provider tells you not to drink. You are pregnant, may be pregnant, or are planning to become pregnant. Alcohol triggers your episodes. If you drink alcohol: Limit how much you use to: 0-1 drink a day for women. 0-2 drinks a day for men. Be aware of how much alcohol is in your drink. In the [place] one drink equals one 12 oz bottle of beer (355 mL), one 5 oz glass of wine (148 mL), or one 1 oz glass of hard liquor (44 mL). General instructions Take over-the-counter and prescription medicines only as told by your health care provider. If caffeine triggers episodes, do not eat, drink, or use anything with caffeine in it. Keep all follow-up visits as told by your health care provider. This is important. Contact a health care provider if: You feel your heart skipping beats. Your heart skips beats and you feel dizzy, light-headed, or very tired. Get help right away if you have: Chest pain. Trouble breathing. Any symptoms of a stroke. "BE FAST" is an easy way to remember the main warning signs of a stroke. B - Balance. Signs are dizziness, sudden trouble walking, or loss of balance. E - Eyes. Signs are trouble seeing or a sudden change in vision. F -  Face. Signs are sudden weakness or numbness of the face, or the face or eyelid drooping on one side. A - Arms. Signs are weakness or numbness in an arm. This happens suddenly and usually on one side of the body. S - Speech. Signs are sudden trouble speaking, slurred speech, or trouble understanding what people say. T - Time. Time to call emergency services. Write down what time symptoms started. Other signs of stroke, such as: A sudden, severe headache with no known cause. Nausea or vomiting. Seizure. These symptoms may represent a serious problem that is an emergency. Do not wait to see if the symptoms will go away. Get medical help right away. Call your local emergency services (911 in the [place] Do not drive yourself to the hospital. Summary A premature atrial contraction [facility]) is a kind of irregular heartbeat (arrhythmia). It happens when the heart beats too early and then pauses before beating again. Treatment depends on your symptoms and whether you have other underlying heart conditions. Contact a health care provider if your heart skips beats and you feel dizzy, light-headed, or very tired. In some cases, this  condition may lead to a stroke. "BE FAST" is an easy way to remember the warning signs of stroke. Get help right away if you have any of the "BE FAST" signs. This information is not intended to replace advice given to you by your health care provider. Make sure you discuss any questions you have with your health care provider. Document Revised: 06/20/2018 Document Reviewed: 06/20/2018 Elsevier Patient Education  2022 [employer].

## 2021-07-12 ENCOUNTER — Telehealth: Payer: Self-pay

## 2021-07-12 NOTE — Chronic Care Management (AMB) (Signed)
Chronic Care Management Pharmacy Assistant   Name: Brian Mccullough  MRN: [id number] DOB: 22-Apr-1951  Brian Mccullough is an 70 y.o. year old male who presents for his initial CCM visit with the clinical pharmacist.  Recent office visits:  05/13/21-Aleksei V. [name],MD (PCP) 2 week follow up visit. Follow up in 3 months. 04/26/21-Aleksei V. [name],MD (PCP) Seen for dizziness. EKG completed. Hold Benazepril and furosemide for 2 more days. Follow up in 2 weeks. 03/29/21-Aleksei V. [name],MD (PCP) 4 month follow up visit. Follow up in 4 months.  Recent consult visits:  07/01/21-Scott Joylene Draft, PA-C (Cardiology) Follow up visit for CAD and palpitations. EKG ordered. Follow up in 6 months.  Change Metoprolol to 25 mg twice daily. Stop taking plavix in October. Follow up in 6 months.  Hospital visits:  None in previous 6 months  Medications: Outpatient Encounter Medications as of 07/12/2021  Medication Sig   aspirin 81 MG EC tablet Take 81 mg by mouth daily.   atorvastatin (LIPITOR) 40 MG tablet Take 1 tablet (40 mg total) by mouth daily.   benazepril (LOTENSIN) 20 MG tablet TAKE 1 TABLET BY MOUTH  DAILY   Cholecalciferol (VITAMIN D3) 1000 UNITS tablet Take 1,000 Units by mouth daily.   clopidogrel (PLAVIX) 75 MG tablet Take 1 tablet (75 mg total) by mouth daily.   cyanocobalamin 100 MCG tablet Take 1,000 mcg by mouth daily.   furosemide (LASIX) 20 MG tablet TAKE 1 TABLET BY MOUTH AS NEEDED FOR FLUID OR EDEMA   loratadine (CLARITIN) 10 MG tablet Take 10 mg by mouth daily as needed for allergies or rhinitis.   meloxicam (MOBIC) 15 MG tablet TAKE 1 TABLET BY MOUTH  DAILY AS NEEDED FOR PAIN   metFORMIN (GLUCOPHAGE) 500 MG tablet TAKE 1 TABLET BY MOUTH  DAILY WITH BREAKFAST   metoprolol tartrate (LOPRESSOR) 25 MG tablet Take 1 tablet (25 mg total) by mouth 2 (two) times daily.   potassium chloride SA (KLOR-CON) 20 MEQ tablet TAKE 1 TABLET BY MOUTH  DAILY   tiZANidine (ZANAFLEX) 4  MG tablet TAKE 1 TABLET BY MOUTH  EVERY 8 HOURS AS NEEDED FOR MUSCLE SPASM(S)   traMADol (ULTRAM) 50 MG tablet TAKE 1 TO 2 TABLETS BY MOUTH TWICE DAILY AS NEEDED   traZODone (DESYREL) 50 MG tablet TAKE 1/2 TO 1 TABLET BY  MOUTH AT BEDTIME AS NEEDED  FOR SLEEP   No facility-administered encounter medications on file as of 07/12/2021.   Aspirin 81 MG EC tablet Last filled:None noted Atorvastatin (LIPITOR) 40 MG tablet Last filled:03/16/21 90 DS Benazepril (LOTENSIN) 20 MG tablet Last filled:01/13/21 90 DS Cholecalciferol (VITAMIN D3) 1000 UNITS tablet Last filled:None noted Clopidogrel (PLAVIX) 75 MG tablet Last filled:03/05/21 90 DS Cyanocobalamin 100 MCG tablet Last filled:None noted Furosemide (LASIX) 20 MG tablet Last filled:11/12/20 7 DS Loratadine (CLARITIN) 10 MG tablet Last filled:None noted Meloxicam (MOBIC) 15 MG tablet Last filled:03/10/21 90 DS MetFORMIN (GLUCOPHAGE) 500 MG tablet Last filled:03/10/21 90 DS Metoprolol tartrate (LOPRESSOR) 25 MG tablet Last filled:01/04/21 30 DS Potassium chloride SA (KLOR-CON) 20 MEQ tablet Last filled:03/18/21 90 DS TiZANidine (ZANAFLEX) 4 MG tablet Last filled:07/30/20 90 DS TraMADol (ULTRAM) 50 MG tablet Last filled:05/22/21 25 DS TraZODone (DESYREL) 50 MG tablet Last filled:10/20/20 90 DS   Care Gaps: FOOT EXAM:Never done Zoster Vaccines- Shingrix:Never done OPHTHALMOLOGY EXAM:Last completed: May 24, 2018 COLONOSCOPY:Last completed: Apr 18, 2017 COVID-19 Vaccine:Last completed: Jul 21, 2020 INFLUENZA VACCINE:Last completed: Jul 30, 2020  Star Rating Drugs: Atorvastatin (LIPITOR) 40 MG  tablet Last filled:03/16/21 90 DS Benazepril (LOTENSIN) 20 MG tablet Last filled:01/13/21 90 DS MetFORMIN (GLUCOPHAGE) 500 MG tablet Last filled:03/10/21 90 DS  Myriam Elta [place], [place]

## 2021-07-14 ENCOUNTER — Other Ambulatory Visit: Payer: Self-pay

## 2021-07-14 ENCOUNTER — Other Ambulatory Visit (INDEPENDENT_AMBULATORY_CARE_PROVIDER_SITE_OTHER): Payer: Medicare Other

## 2021-07-14 ENCOUNTER — Other Ambulatory Visit: Payer: Self-pay | Admitting: Internal Medicine

## 2021-07-14 ENCOUNTER — Ambulatory Visit (INDEPENDENT_AMBULATORY_CARE_PROVIDER_SITE_OTHER): Payer: Medicare Other

## 2021-07-14 DIAGNOSIS — R300 Dysuria: Secondary | ICD-10-CM

## 2021-07-14 DIAGNOSIS — I25110 Atherosclerotic heart disease of native coronary artery with unstable angina pectoris: Secondary | ICD-10-CM

## 2021-07-14 DIAGNOSIS — I10 Essential (primary) hypertension: Secondary | ICD-10-CM

## 2021-07-14 DIAGNOSIS — E119 Type 2 diabetes mellitus without complications: Secondary | ICD-10-CM

## 2021-07-14 LAB — URINALYSIS
Bilirubin Urine: NEGATIVE
Hgb urine dipstick: NEGATIVE
Ketones, ur: NEGATIVE
Leukocytes,Ua: NEGATIVE
Nitrite: NEGATIVE
Specific Gravity, Urine: 1.02 (ref 1.000–1.030)
Total Protein, Urine: NEGATIVE
Urine Glucose: NEGATIVE
Urobilinogen, UA: 2 — AB (ref 0.0–1.0)
pH: 6 (ref 5.0–8.0)

## 2021-07-14 NOTE — Patient Instructions (Signed)
Brian Mccullough,  It was great to talk to you today!  Please call me with any questions or concerns.  Visit Information   PATIENT GOALS:   Goals Addressed             This Visit's Progress    Lifestyle Change-Hypertension       Timeframe:  Long-Range Goal Priority:  High Start Date:  07/14/2021                           Expected End Date:  01/12/2022                     Follow Up Date 01/12/2022/   - agree to work together to make changes - ask questions to understand - learn about high blood pressure    Why is this important?   The changes that you are asked to make may be hard to do.  This is especially true when the changes are life-long.  Knowing why it is important to you is the first step.  Working on the change with your family or support person helps you not feel alone.  Reward yourself and family or support person when goals are met. This can be an activity you choose like bowling, hiking, biking, swimming or shooting hoops.        Set My Target A1C-Diabetes Type 2       Timeframe:  Long-Range Goal Priority:  High Start Date: 07/14/2021                            Expected End Date:  01/12/2022                     Follow Up Date 01/12/2022   - set target A1C - keep A1c <7.0%    Why is this important?   Your target A1C is decided together by you and your doctor.  It is based on several things like your age and other health issues.         Consent to CCM Services: Brian Mccullough was given information about Chronic Care Management services including:  CCM service includes personalized support from designated clinical staff supervised by his physician, including individualized plan of care and coordination with other care providers 24/7 contact phone numbers for assistance for urgent and routine care needs. Service will only be billed when office clinical staff spend 20 minutes or more in a month to coordinate care. Only one practitioner may furnish and bill the  service in a calendar month. The patient may stop CCM services at any time (effective at the end of the month) by phone call to the office staff. The patient will be responsible for cost sharing (co-pay) of up to 20% of the service fee (after annual deductible is met).  Patient agreed to services and verbal consent obtained.   Patient verbalizes understanding of instructions provided today and agrees to view in [place].   Telephone follow up appointment with care management team member scheduled for: 6 months The patient has been provided with contact information for the care management team and has been advised to call with any health related questions or concerns.   Tomasa Blase, PharmD Clinical Pharmacist, [place]    CLINICAL CARE PLAN: Patient Care Plan: CCM Care Plan     Problem Identified: Hypertension, Hyperlipidemia, Diabetes, Coronary Artery Disease,  and Chronic Pain   Priority: High  Onset Date: 07/14/2021     Long-Range Goal: Disease Management   Start Date: 07/14/2021  Expected End Date: 01/12/2022  This Visit's Progress: On track  Priority: High  Note:   Current Barriers:  Unable to independently monitor therapeutic efficacy  Pharmacist Clinical Goal(s):  Patient will achieve adherence to monitoring guidelines and medication adherence to achieve therapeutic efficacy maintain control of LDL, A1c, and BP as evidenced by next lipid panel / A1c results, and BP readings  through collaboration with PharmD and provider.   Interventions: 1:1 collaboration with [name], Evie Lacks, MD regarding development and update of comprehensive plan of care as evidenced by provider attestation and co-signature Inter-disciplinary care team collaboration (see longitudinal plan of care) Comprehensive medication review performed; medication list updated in electronic medical record  Hypertension (BP goal <130/80) -Controlled -Current treatment: Metoprolol Tartrate 68m  - 1 tablet twice daily  Benazepril 258m- 1 tablet daily  Furosemide 2013m 1 tablet daily as needed  Potassium Chloride 60m78m 1 tablet daily Last Potassium level: 4.2mEq38m  -Medications previously tried: amlodipine, isosorbide dinitrate,   -Current home readings: n/a - has not been monitoring at home BP Readings from Last 3 Encounters:  07/01/21 120/60  05/13/21 132/70  04/26/21 (!) 160/82  -Current dietary habits: watches sodium in diet  -Current exercise habits: walking - tries to do everyday 45-60 minutes  -Denies hypotensive/hypertensive symptoms -Educated on BP goals and benefits of medications for prevention of heart attack, stroke and kidney damage; Daily salt intake goal < 2300 mg; Exercise goal of 150 minutes per week; Importance of home blood pressure monitoring; Proper BP monitoring technique; Symptoms of hypotension and importance of maintaining adequate hydration; -Counseled on diet and exercise extensively Recommended to continue current medication  Hyperlipidemia/ Coronary Artery Disease/ History of STEMI: (LDL goal < 70) -Controlled Lab Results  Component Value Date   LDLCALC 69 06/20/2021  -Current treatment: Atorvastatin 40mg 32mtablet every other day  Aspirin 81mg -85mablet daily  Clopidogrel 75mg - 44mblet daily (stopping 11/12022) -Medications previously tried: lovaza  -Current dietary patterns: trying reduce on high cholesterol foods -Current exercise habits: walking - tries to do everyday 45-60 minutes  -Educated on Cholesterol goals;  Benefits of statin for ASCVD risk reduction; Importance of limiting foods high in cholesterol; Exercise goal of 150 minutes per week; -Counseled on diet and exercise extensively Recommended to continue current medication  Diabetes (A1c goal <7%) -Controlled Lab Results  Component Value Date   HGBA1C 5.9 03/29/2021  -Current medications: Metformin 500mg - 129mlet daily  -Medications previously tried:  glimepiride  -Denies hypoglycemic/hyperglycemic symptoms -Current meal patterns:  breakfast: typically very light - may be a cup of coffee and breakfast  lunch: may or may not eat, usually a sandwich / fruit   dinner: protein, vegetable, carb  snacks: cheese with cracker drinks: regular soda 3-4  -Current exercise: walking - tries to do everyday 45-60 minutes  -Educated on A1c and blood sugar goals; Complications of diabetes including kidney damage, retinal damage, and cardiovascular disease; Exercise goal of 150 minutes per week; Carbohydrate counting and/or plate method -Counseled to check feet daily and get yearly eye exams -Counseled on diet and exercise extensively Recommended to continue current medication   Insomnia (Goal: Promotion of quality sleep) -Controlled -Current treatment  Trazodone 50mg - 1/4mtablet at bedtime as needed for sleep - notes that he does not need to use very often  -  Medications previously tried: nortriptyline   -Recommended to continue current medication  Chronic Pain - low back pain (Goal: Pain control) -Controlled -Current treatment  Meloxicam 82m - 1 tablet daily  Tramadol 550m- 1-2 tablets twice daily as needed  Tizanidine 50m68m 1 tablet every 8 hours as needed - typically does not have to take any more than once daily  -Medications previously tried: cyclobenzaprine,  percocet, etodolac, APAP -Recommended to continue current medication  Health Maintenance -Vaccine gaps: Shingles, COVID booster, Influenza vaccine -Current therapy:  Loratadine 4m92m1 tablet daily as needed Vitamin B12 1000 mcg - 1 tablet daily  Vitamin D3 1000 units - 1 tablet daily -Educated on Cost vs benefit of each product must be carefully weighed by individual consumer -Patient is satisfied with current therapy and denies issues -Recommended to continue current medication  Patient Goals/Self-Care Activities Patient will:  - take medications as  prescribed target a minimum of 150 minutes of moderate intensity exercise weekly engage in dietary modifications by reducing carb intake / reducing soda   Follow Up Plan: Telephone follow up appointment with care management team member scheduled for: 6 months The patient has been provided with contact information for the care management team and has been advised to call with any health related questions or concerns.

## 2021-07-14 NOTE — Progress Notes (Addendum)
Chronic Care Management Pharmacy Note  07/14/2021 Name:  Brian Mccullough MRN:  [id number] DOB:  Feb 09, 1951  Summary: -Patient reports that he is doing well, BP, A1c, and LDL all well controlled on last visit  -Patient reports that he feels he may have a UTI today, notes to pain on urination / increase in frequency - same signs/symptoms as a previous UTI he has had  -Patient reports to watching diet, trying to reduce sodium, cholesterol, and carb intake, but does note to drink ~3 cans of soda a day  -Will be discontinuing clopidogrel on 08/09/2021 - to continue ASA 110m daily thereafter  -Tries to remain active, walking daily if he is able to for about 45-60 minutes at a time   Recommendations/Changes made from today's visit: -Recommending for patient to continue current medications, advised that if he were to stop drinking soda, highly possible A1c could be managed without metformin, patient voiced understanding  -Recommend for patient to have urinalysis completed to confirm possible UTI  Subjective: TKEVANTE LUNTis an 70y.o. year old male who is a primary patient of Mccullough, Brian Lacks MD.  The CCM team was consulted for assistance with disease management and care coordination needs.    Engaged with patient face to face for initial visit in response to provider referral for pharmacy case management and/or care coordination services.   Consent to Services:  The patient was given the following information about Chronic Care Management services today, agreed to services, and gave verbal consent: 1. CCM service includes personalized support from designated clinical staff supervised by the primary care provider, including individualized plan of care and coordination with other care providers 2. 24/7 contact phone numbers for assistance for urgent and routine care needs. 3. Service will only be billed when office clinical staff spend 20 minutes or more in a month to coordinate care. 4. Only  one practitioner may furnish and bill the service in a calendar month. 5.The patient may stop CCM services at any time (effective at the end of the month) by phone call to the office staff. 6. The patient will be responsible for cost sharing (co-pay) of up to 20% of the service fee (after annual deductible is met). Patient agreed to services and consent obtained.  Patient Care Team: Mccullough, Brian Lacks MD as PCP - General (Internal Medicine) CSherren Mocha MD as PCP - Cardiology (Cardiology) CMarily Memos MD (Orthopedic Surgery) AWonda Olds MD as Consulting Physician (Cardiothoracic Surgery) WSharmon Revereas Physician Assistant (Cardiology) SDelice BisonDDarnelle Maffucci RBanner [facility] Samaritan Medical Centeras Pharmacist (Pharmacist)  Recent office visits: 05/13/21-Brian V. [name],MD (PCP) 2 week follow up visit. Follow up in 3 months. - continue with QOD lipitor  04/26/21-Brian V. [name],MD (PCP) Seen for dizziness. EKG completed. Hold Benazepril and furosemide for 2 more days. Immodium as needed - Follow up in 2 weeks. 03/29/21-Brian V. [name],MD (PCP) 4 month follow up visit. No changes to medications - Follow up in 4 months.   Recent consult visits:  07/01/21-Scott TJoylene Draft PA-C (Cardiology) Follow up visit for CAD and palpitations - previous STEMI 07/2020 . EKG ordered. Follow up in 6 months.  Change Metoprolol to 25 mg twice daily. Stop taking plavix in October - continue with aspirin. Follow up in 6 months.   Hospital visits:  None in previous 6 months  Objective:  Lab Results  Component Value Date   CREATININE 0.97 04/26/2021   BUN 11 04/26/2021   GFR 79.84 04/26/2021   GFRNONAA 69  10/19/2020   GFRAA 79 10/19/2020   NA 138 04/26/2021   K 4.2 04/26/2021   CALCIUM 9.2 04/26/2021   CO2 28 04/26/2021   GLUCOSE 98 04/26/2021    Lab Results  Component Value Date/Time   HGBA1C 5.9 03/29/2021 09:46 AM   HGBA1C 5.4 08/04/2020 01:28 AM   GFR 79.84 04/26/2021 09:26 AM   GFR 63.79  03/29/2021 09:46 AM   MICROALBUR 2.0 (H) 03/29/2021 09:46 AM   MICROALBUR <0.2 04/26/2020 09:41 AM    Last diabetic Eye exam:  Lab Results  Component Value Date/Time   HMDIABEYEEXA No Retinopathy 05/24/2018 12:00 AM    Last diabetic Foot exam:  No results found for: HMDIABFOOTEX   Lab Results  Component Value Date   CHOL 131 06/20/2021   HDL 38 (L) 06/20/2021   LDLCALC 69 06/20/2021   LDLDIRECT 88.1 01/23/2011   TRIG 135 06/20/2021   CHOLHDL 3.4 06/20/2021    Hepatic Function Latest Ref Rng & Units 06/20/2021 03/29/2021 10/19/2020  Total Protein 6.0 - 8.5 g/dL 6.6 7.0 6.4  Albumin 3.8 - 4.8 g/dL 4.0 4.0 4.0  AST 0 - 40 IU/L '10 13 13  ' ALT 0 - 44 IU/L '6 7 8  ' Alk Phosphatase 44 - 121 IU/L 73 60 74  Total Bilirubin 0.0 - 1.2 mg/dL 0.5 0.7 0.5  Bilirubin, Direct 0.00 - 0.40 mg/dL 0.15 - -    Lab Results  Component Value Date/Time   TSH 0.673 08/04/2020 01:28 AM   TSH 0.88 07/30/2020 08:30 AM   TSH 0.87 08/29/2019 09:11 AM    CBC Latest Ref Rng & Units 04/26/2021 08/07/2020 08/06/2020  WBC 4.0 - 10.5 K/uL 3.4(L) 10.0 10.3  Hemoglobin 13.0 - 17.0 g/dL 14.4 10.6(L) 10.8(L)  Hematocrit 39.0 - 52.0 % 42.8 31.2(L) 32.5(L)  Platelets 150.0 - 400.0 K/uL 178.0 122(L) 100(L)    No results found for: VD25OH  Clinical ASCVD: Yes  The ASCVD Risk score (Arnett DK, et al., 2019) failed to calculate for the following reasons:   The patient has a prior MI or stroke diagnosis    Depression screen [facility] 2/9 12/10/2020 10/12/2020 07/30/2020  Decreased Interest 0 0 0  Down, Depressed, Hopeless 0 0 0  PHQ - 2 Score 0 0 0  Some recent data might be hidden   Social History   Tobacco Use  Smoking Status Former   Types: Cigars   Quit date: 08/03/2020   Years since quitting: 0.9  Smokeless Tobacco Never  Tobacco Comments   2-3 small cigars/day   BP Readings from Last 3 Encounters:  07/01/21 120/60  05/13/21 132/70  04/26/21 (!) 160/82   Pulse Readings from Last 3 Encounters:   07/01/21 (!) 50  05/13/21 (!) 52  04/26/21 (!) 54   Wt Readings from Last 3 Encounters:  07/01/21 237 lb 12.8 oz (107.9 kg)  05/13/21 233 lb (105.7 kg)  04/26/21 228 lb (103.4 kg)   BMI Readings from Last 3 Encounters:  07/01/21 29.72 kg/m  05/13/21 28.74 kg/m  04/26/21 28.12 kg/m    Assessment/Interventions: Review of patient past medical history, allergies, medications, health status, including review of consultants reports, laboratory and other test data, was performed as part of comprehensive evaluation and provision of chronic care management services.   SDOH:  (Social Determinants of Health) assessments and interventions performed: Yes  SDOH Screenings   Alcohol Screen: Low Risk    Last Alcohol Screening Score (AUDIT): 0  Depression (PHQ2-9): Low Risk    PHQ-2 Score: 0  Financial Resource Strain: Low Risk    Difficulty of Paying Living Expenses: Not hard at all  Food Insecurity: No Food Insecurity   Worried About Charity fundraiser in the Last Year: Never true   Arboriculturist in the Last Year: Never true  Housing: Low Risk    Last Housing Risk Score: 0  Physical Activity: Sufficiently Active   Days of Exercise per Week: 5 days   Minutes of Exercise per Session: 30 min  Social Connections: Engineer, building services of Communication with Friends and Family: More than three times a week   Frequency of Social Gatherings with Friends and Family: More than three times a week   Attends Religious Services: More than 4 times per year   Active Member of [employer] or Organizations: Yes   Attends Music therapist: More than 4 times per year   Marital Status: Married  Stress: No Stress Concern Present   Feeling of Stress : Not at all  Tobacco Use: Medium Risk   Smoking Tobacco Use: Former   Smokeless Tobacco Use: Never  Transportation Needs: No Data processing manager (Medical): No   Lack of Transportation (Non-Medical): No     CCM Care Plan  Allergies  Allergen Reactions   Codeine Nausea Only    Medications Reviewed Today     Reviewed by Tomasa Blase, [facility] (Pharmacist) on 07/14/21 at 1211  Med List Status: <None>   Medication Order Taking? Sig Documenting Provider Last Dose Status Informant  aspirin 81 MG EC tablet [id number] Yes Take 81 mg by mouth daily. [provider] Taking Active            Med Note Olena Heckle, MICHELE   Tue Aug 24, 2020 11:28 AM)    atorvastatin (LIPITOR) 40 MG tablet [id number] Yes Take 1 tablet (40 mg total) by mouth daily.  Patient taking differently: Take 40 mg by mouth every other day.   Richardson Dopp T, PA-C Taking Active   benazepril (LOTENSIN) 20 MG tablet [id number] Yes TAKE 1 TABLET BY MOUTH  DAILY Mccullough, Evie Lacks, MD Taking Active   Cholecalciferol (VITAMIN D3) 1000 UNITS tablet [id number] Yes Take 1,000 Units by mouth daily. [provider] Taking Active   clopidogrel (PLAVIX) 75 MG tablet [id number] Yes Take 1 tablet (75 mg total) by mouth daily. Sherren Mocha, MD Taking Active   cyanocobalamin 1000 MCG tablet [id number] Yes Take 1,000 mcg by mouth daily. [provider] Taking Active   furosemide (LASIX) 20 MG tablet [id number] Yes TAKE 1 TABLET BY MOUTH AS NEEDED FOR FLUID OR EDEMA Richardson Dopp T, PA-C Taking Active   loratadine (CLARITIN) 10 MG tablet [id number] Yes Take 10 mg by mouth daily as needed for allergies or rhinitis. [provider] Taking Active   meloxicam (MOBIC) 15 MG tablet [id number] Yes TAKE 1 TABLET BY MOUTH  DAILY AS NEEDED FOR PAIN Mccullough, Evie Lacks, MD Taking Active   metFORMIN (GLUCOPHAGE) 500 MG tablet [id number] Yes TAKE 1 TABLET BY MOUTH  DAILY WITH BREAKFAST Mccullough, Evie Lacks, MD Taking Active   metoprolol tartrate (LOPRESSOR) 25 MG tablet [id number] Yes Take 1 tablet (25 mg total) by mouth 2 (two) times daily. Richardson Dopp T, PA-C Taking Active   potassium chloride SA (KLOR-CON) 20 MEQ tablet [id number]  Yes TAKE 1 TABLET BY MOUTH  DAILY Mccullough, Evie Lacks, MD Taking Active   tiZANidine (ZANAFLEX) 4 MG tablet [id number] Yes TAKE 1 TABLET BY MOUTH  EVERY 8 HOURS AS NEEDED FOR MUSCLE SPASM(S) Mccullough, Evie Lacks, MD Taking Active   traMADol (ULTRAM) 50 MG tablet [id number] Yes TAKE 1 TO 2 TABLETS BY MOUTH TWICE DAILY AS NEEDED  Patient taking differently: Take 50 mg by mouth 2 (two) times daily. 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   STEMI (ST elevation myocardial infarction) ([place]) 08/03/2020   STEMI involving right coronary artery ([place]) 08/03/2020   Trigger thumb of right hand 04/26/2020   Testicular pain, right 03/18/2018   Lightheadedness 04/09/2017   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   Stress at home 03/27/2016   Bladder neck obstruction 05/29/2014   Caries 05/29/2014   Acute bronchitis 04/19/2014   Traveler's diarrhea 02/02/2014   Right inguinal hernia 01/13/2014   Recurrent left inguinal hernia 01/13/2014   Rt groin pain 12/30/2013   Palpitations 06/19/2013   Right groin pain 03/27/2013   Well adult exam 10/07/2012   Hypogonadism male 05/29/2012   Erectile dysfunction 05/27/2012   Insomnia 05/29/2011   WEIGHT GAIN, ABNORMAL 05/30/2010   Dyslipidemia 01/31/2010   CHEST PAIN 01/31/2010   Groin pain, chronic, left 11/02/2009   LOW BACK PAIN 11/02/2009   DIZZINESS 03/02/2009   B12 deficiency 11/02/2008    ECZEMA 11/02/2008   Vitamin D deficiency 01/27/2008   Obesity 01/27/2008   ANXIETY 10/28/2007   ALCOHOLISM 10/28/2007   Osteoarthritis 10/28/2007   Diabetes mellitus type 2, controlled ([place]) 05/10/2007   HTN (hypertension) 05/10/2007    Immunization History  Administered Date(s) Administered   Fluad Quad(high Dose 65+) 07/30/2020   Influenza Split 09/28/2011, 06/19/2012   Influenza Whole 06/29/2008, 07/05/2009, 09/20/2010   Influenza, High Dose Seasonal PF 08/14/2017, 06/14/2018, 07/12/2019   Influenza,inj,Quad PF,6+ Mos 06/19/2013, 05/29/2014, 08/30/2015, 08/01/2016   PFIZER(Purple Top)SARS-COV-2 Vaccination 10/28/2019, 11/18/2019, 07/21/2020   Pneumococcal Conjugate-13 05/29/2014   Pneumococcal Polysaccharide-23 11/02/2009, 11/15/2017   Td 11/02/2009   Tdap 07/30/2020   Zoster, Live 12/30/2013    Conditions to be addressed/monitored:  Hypertension, Hyperlipidemia, Diabetes, Coronary Artery Disease, and Chronic Pain  Care Plan : CCM Care Plan  Updates made by Tomasa Blase, RPH since 07/14/2021 12:00 AM     Problem: Hypertension, Hyperlipidemia, Diabetes, Coronary Artery Disease, and Chronic Pain   Priority: High  Onset Date: 07/14/2021     Long-Range Goal: Disease Management   Start Date: 07/14/2021  Expected End Date: 01/12/2022  This Visit's Progress: On track  Priority: High  Note:   Current Barriers:  Unable to independently monitor therapeutic efficacy  Pharmacist Clinical Goal(s):  Patient will achieve adherence to monitoring guidelines and medication adherence to achieve therapeutic efficacy maintain control of LDL, A1c, and BP as evidenced by next lipid panel / A1c results, and BP readings  through collaboration with PharmD and provider.   Interventions: 1:1 collaboration with Mccullough, Evie Lacks,  MD regarding development and update of comprehensive plan of care as evidenced by provider attestation and co-signature Inter-disciplinary care team  collaboration (see longitudinal plan of care) Comprehensive medication review performed; medication list updated in electronic medical record  Hypertension (BP goal <130/80) -Controlled -Current treatment: Metoprolol Tartrate 24m - 1 tablet twice daily  Benazepril 234m- 1 tablet daily  Furosemide 2045m 1 tablet daily as needed  Potassium Chloride 70m68m 1 tablet daily Last Potassium level: 4.2mEq36m  -Medications previously tried: amlodipine, isosorbide dinitrate,   -Current home readings: n/a - has not been monitoring at home BP Readings from Last 3 Encounters:  07/01/21 120/60  05/13/21 132/70  04/26/21 (!) 160/82  -Current dietary habits: watches sodium in diet  -Current exercise habits: walking - tries to do everyday 45-60 minutes  -Denies hypotensive/hypertensive symptoms -Educated on BP goals and benefits of medications for prevention of heart attack, stroke and kidney damage; Daily salt intake goal < 2300 mg; Exercise goal of 150 minutes per week; Importance of home blood pressure monitoring; Proper BP monitoring technique; Symptoms of hypotension and importance of maintaining adequate hydration; -Counseled on diet and exercise extensively Recommended to continue current medication  Hyperlipidemia/ Coronary Artery Disease/ History of STEMI: (LDL goal < 70) -Controlled Lab Results  Component Value Date   LDLCALC 69 06/20/2021  -Current treatment: Atorvastatin 40mg 23mtablet every other day  Aspirin 81mg -61mablet daily  Clopidogrel 75mg - 100mblet daily (stopping 11/12022) -Medications previously tried: lovaza  -Current dietary patterns: trying reduce on high cholesterol foods -Current exercise habits: walking - tries to do everyday 45-60 minutes  -Educated on Cholesterol goals;  Benefits of statin for ASCVD risk reduction; Importance of limiting foods high in cholesterol; Exercise goal of 150 minutes per week; -Counseled on diet and exercise  extensively Recommended to continue current medication  Diabetes (A1c goal <7%) -Controlled Lab Results  Component Value Date   HGBA1C 5.9 03/29/2021  -Current medications: Metformin 500mg - 1106mlet daily  -Medications previously tried: glimepiride  -Denies hypoglycemic/hyperglycemic symptoms -Current meal patterns:  breakfast: typically very light - may be a cup of coffee and breakfast  lunch: may or may not eat, usually a sandwich / fruit   dinner: protein, vegetable, carb  snacks: cheese with cracker drinks: regular soda 3-4  -Current exercise: walking - tries to do everyday 45-60 minutes  -Educated on A1c and blood sugar goals; Complications of diabetes including kidney damage, retinal damage, and cardiovascular disease; Exercise goal of 150 minutes per week; Carbohydrate counting and/or plate method -Counseled to check feet daily and get yearly eye exams -Counseled on diet and exercise extensively Recommended to continue current medication   Insomnia (Goal: Promotion of quality sleep) -Controlled -Current treatment  Trazodone 50mg - 1/6mtablet at bedtime as needed for sleep - notes that he does not need to use very often  -Medications previously tried: nortriptyline   -Recommended to continue current medication  Chronic Pain - low back pain (Goal: Pain control) -Controlled -Current treatment  Meloxicam 15mg - 1 t107mt daily  Tramadol 50mg - 1-2 30mets twice daily as needed  Tizanidine 4mg - 1 tabl76mevery 8 hours as needed - typically does not have to take any more than once daily  -Medications previously tried: cyclobenzaprine,  percocet, etodolac, APAP -Recommended to continue current medication  Health Maintenance -Vaccine gaps: Shingles, COVID booster, Influenza vaccine -Current therapy:  Loratadine 10mg - 1 tabl36maily as needed Vitamin B12 1000 mcg -  1 tablet daily  Vitamin D3 1000 units - 1 tablet daily -Educated on Cost vs benefit of each product  must be carefully weighed by individual consumer -Patient is satisfied with current therapy and denies issues -Recommended to continue current medication  Patient Goals/Self-Care Activities Patient will:  - take medications as prescribed target a minimum of 150 minutes of moderate intensity exercise weekly engage in dietary modifications by reducing carb intake / reducing soda   Follow Up Plan: Telephone follow up appointment with care management team member scheduled for: 6 months The patient has been provided with contact information for the care management team and has been advised to call with any health related questions or concerns.        Medication Assistance: None required.  Patient affirms current coverage meets needs.  Patient's preferred pharmacy is:  [id number] [address], [place] [address] Phone: [phone] Fax: [phone]  [facility]  ([place]) - [place], [facility] [address] [place] [address] [place] [address] Phone: [phone] Fax: [phone]  Uses pill box? No  Pt endorses 100% compliance  Care Plan and Follow Up Patient Decision:  Patient agrees to Care Plan and Follow-up.  Plan: Telephone follow up appointment with care management team member scheduled for:  6 months and The patient has been provided with contact information for the care management team and has been advised to call with any health related questions or concerns.   Tomasa Blase, PharmD Clinical Pharmacist, [place] screening examination/treatment/procedure(s) were performed by non-physician practitioner and as supervising physician I was immediately available for consultation/collaboration.  I agree with above. Lew Dawes, MD

## 2021-07-18 ENCOUNTER — Telehealth: Payer: Self-pay

## 2021-07-18 NOTE — Telephone Encounter (Signed)
Please advise as the pt has stated he is concerned about the finding in his UA lab that he has seen online via [place]. Pt states he is still having some urinary discomfort a/w frequency and unable to completley void bladder.  Please call pt at [phone] with further instructions.

## 2021-07-18 NOTE — Telephone Encounter (Signed)
[name] comes urine test was okay.  Please see me in the office if problems.  Thanks

## 2021-07-19 NOTE — Telephone Encounter (Signed)
Notified pt w/MD response. Pt states he has made appt for tomorrow to see Dr. Jenny Reichmann..lmb

## 2021-07-20 ENCOUNTER — Encounter: Payer: Self-pay | Admitting: Internal Medicine

## 2021-07-20 ENCOUNTER — Other Ambulatory Visit: Payer: Self-pay

## 2021-07-20 ENCOUNTER — Ambulatory Visit (INDEPENDENT_AMBULATORY_CARE_PROVIDER_SITE_OTHER): Payer: Medicare Other | Admitting: Internal Medicine

## 2021-07-20 VITALS — BP 146/70 | HR 65 | Temp 98.4°F | Ht 75.0 in | Wt 239.0 lb

## 2021-07-20 DIAGNOSIS — N400 Enlarged prostate without lower urinary tract symptoms: Secondary | ICD-10-CM | POA: Diagnosis not present

## 2021-07-20 DIAGNOSIS — I10 Essential (primary) hypertension: Secondary | ICD-10-CM

## 2021-07-20 DIAGNOSIS — E1165 Type 2 diabetes mellitus with hyperglycemia: Secondary | ICD-10-CM

## 2021-07-20 MED ORDER — TAMSULOSIN HCL 0.4 MG PO CAPS: 0.4000 mg | ORAL_CAPSULE | Freq: Every day | ORAL | 3 refills | Status: DC

## 2021-07-20 NOTE — Progress Notes (Signed)
Patient ID: Brian Mccullough, male   DOB: 27-Jun-1951, 70 y.o.   MRN: [id number]        Chief Complaint:  Chief Complaint  Patient presents with   Office Visit    Urinary issues; patient c/o having "muscle spasms" in lower abdomen, frequency, not completely emptying         HPI:  Brian Mccullough is a 70 y.o. male here with urinary symptoms as above for 3 days, and Denies urinary symptoms such as dysuria, urgency, flank pain, hematuria or n/v, fever, chills.  BP is < 140/90 at home.  Pt denies chest pain, increased sob or doe, wheezing, orthopnea, PND, increased LE swelling, palpitations, dizziness or syncope.   Pt denies polydipsia, polyuria, or new focal neuro s/s.  No recent psa.         Wt Readings from Last 3 Encounters:  07/20/21 239 lb (108.4 kg)  07/01/21 237 lb 12.8 oz (107.9 kg)  05/13/21 233 lb (105.7 kg)   BP Readings from Last 3 Encounters:  07/20/21 (!) 146/70  07/01/21 120/60  05/13/21 132/70         Past Medical History:  Diagnosis Date   Allergy    CAD (coronary artery disease)    s/p Inf STEMI 10/21>>POBA to RCA >> s/p CABG (L-LAD, S-PDA, L Radial-OM1/RI, RIMA-RPLA) // post op AF; SVT >> Amiod Rx // Echo 10/21: EF 55-60, no RWMA, GLS -18.5, normal RVSF, mild LAE, trivial MR    Carotid artery disease ([place])    [place] 10/21: R 1-39; L 40-59   Hyperactive gag reflex    Hypertension    under control with meds., has been on med. x 30 yr.   Inguinal hernia 01/2014   bilateral   LBP (low back pain)    Non-insulin dependent type 2 diabetes mellitus (HCC)    OA (osteoarthritis) of knee 01/2004   left   Palpitations    s/p Inf STEMI 07/2020   POBA to RCA >> CABG   Vitamin B 12 deficiency    Past Surgical History:  Procedure Laterality Date   CARDIAC CATHETERIZATION     CORONARY ARTERY BYPASS GRAFT N/A 08/04/2020   Procedure: CORONARY ARTERY BYPASS GRAFTING (CABG), ON PUMP, TIMES SIX, USING BILATERAL INTERNAL MAMMARY ARTERIES, RIGHT ENDOSCOPICALLY HARVESTED GREATER  SAPHENOUS VEIN, AND RIGHT RADIAL ARTERY OPEN HARVEST;  Surgeon: Wonda Olds, MD;  Location: [facility];  Service: Open Heart Surgery;  Laterality: N/A;   CORONARY/GRAFT ACUTE MI REVASCULARIZATION N/A 08/03/2020   Procedure: Coronary/Graft Acute MI Revascularization;  Surgeon: Sherren Mocha, MD;  Location: [facility];  Service: Cardiovascular;  Laterality: N/A;   ENDOVEIN HARVEST OF GREATER SAPHENOUS VEIN Right 08/04/2020   Procedure: ENDOVEIN HARVEST OF GREATER SAPHENOUS VEIN;  Surgeon: Wonda Olds, MD;  Location: [place];  Service: Open Heart Surgery;  Laterality: Right;   INGUINAL HERNIA REPAIR  age 3-5   INGUINAL HERNIA REPAIR Bilateral 02/09/2014   Procedure: OPEN BILATERAL INGUINAL HERNIA REPAIR WITH MESH;  Surgeon: [name], MD;  Location: [facility];  Service: General;  Laterality: Bilateral;   INSERTION OF MESH Bilateral 02/09/2014   Procedure: INSERTION OF MESH;  Surgeon: [name], MD;  Location: [facility];  Service: General;  Laterality: Bilateral;   KNEE ARTHROSCOPY Right    LEFT HEART CATH AND CORONARY ANGIOGRAPHY N/A 08/03/2020   Procedure: LEFT HEART CATH AND CORONARY ANGIOGRAPHY;  Surgeon: Sherren Mocha, MD;  Location: [facility];  Service: Cardiovascular;  Laterality: N/A;   ORCHIECTOMY Left 05/31/2017   Procedure: LEFT RADICAL ORCHIECTOMY;  Surgeon: Irine Seal, MD;  Location: [facility];  Service: Urology;  Laterality: Left;   RADIAL ARTERY HARVEST Right 08/04/2020   Procedure: RADIAL ARTERY HARVEST;  Surgeon: Wonda Olds, MD;  Location: [place];  Service: Open Heart Surgery;  Laterality: Right;   TEE WITHOUT CARDIOVERSION N/A 08/04/2020   Procedure: TRANSESOPHAGEAL ECHOCARDIOGRAM (TEE);  Surgeon: Wonda Olds, MD;  Location: [place];  Service: Open Heart Surgery;  Laterality: N/A;   TONSILLECTOMY AND ADENOIDECTOMY  1960   TOTAL KNEE ARTHROPLASTY Left 01/14/2004    reports that he quit  smoking about a year ago. His smoking use included cigars. He has never used smokeless tobacco. He reports that he does not drink alcohol and does not use drugs. family history includes Diabetes in his father and mother; Hypertension in his mother. Allergies  Allergen Reactions   Codeine Nausea Only   Current Outpatient Medications on File Prior to Visit  Medication Sig Dispense Refill   aspirin 81 MG EC tablet Take 81 mg by mouth daily.     atorvastatin (LIPITOR) 40 MG tablet Take 1 tablet (40 mg total) by mouth daily. (Patient taking differently: Take 40 mg by mouth every other day.) 90 tablet 3   benazepril (LOTENSIN) 20 MG tablet TAKE 1 TABLET BY MOUTH  DAILY 90 tablet 3   Cholecalciferol (VITAMIN D3) 1000 UNITS tablet Take 1,000 Units by mouth daily.     clopidogrel (PLAVIX) 75 MG tablet Take 1 tablet (75 mg total) by mouth daily. 90 tablet 3   cyanocobalamin 1000 MCG tablet Take 1,000 mcg by mouth daily.     furosemide (LASIX) 20 MG tablet TAKE 1 TABLET BY MOUTH AS NEEDED FOR FLUID OR EDEMA 30 tablet 3   loratadine (CLARITIN) 10 MG tablet Take 10 mg by mouth daily as needed for allergies or rhinitis.     meloxicam (MOBIC) 15 MG tablet TAKE 1 TABLET BY MOUTH  DAILY AS NEEDED FOR PAIN 90 tablet 1   metFORMIN (GLUCOPHAGE) 500 MG tablet TAKE 1 TABLET BY MOUTH  DAILY WITH BREAKFAST 90 tablet 3   metoprolol tartrate (LOPRESSOR) 25 MG tablet Take 1 tablet (25 mg total) by mouth 2 (two) times daily. 180 tablet 3   potassium chloride SA (KLOR-CON) 20 MEQ tablet TAKE 1 TABLET BY MOUTH  DAILY 90 tablet 3   tiZANidine (ZANAFLEX) 4 MG tablet TAKE 1 TABLET BY MOUTH  EVERY 8 HOURS AS NEEDED FOR MUSCLE SPASM(S) 270 tablet 0   traMADol (ULTRAM) 50 MG tablet TAKE 1 TO 2 TABLETS BY MOUTH TWICE DAILY AS NEEDED (Patient taking differently: Take 50 mg by mouth 2 (two) times daily. TAKE 1 TO 2 TABLETS BY MOUTH TWICE DAILY AS NEEDED) 100 tablet 3   traZODone (DESYREL) 50 MG tablet TAKE 1/2 TO 1 TABLET BY  MOUTH  AT BEDTIME AS NEEDED  FOR SLEEP (Patient taking differently: TAKE 1/2 TO 1 TABLET BY  MOUTH AT BEDTIME AS NEEDED  FOR SLEEP) 90 tablet 3   No current facility-administered medications on file prior to visit.        ROS:  All others reviewed and negative.  Objective        PE:  BP (!) 146/70 (BP Location: Left Arm, Patient Position: Sitting, Cuff Size: Large)   Pulse 65   Temp 98.4 F (36.9 C) (Oral)   Ht 6\' 3"  (1.905 m)  Wt 239 lb (108.4 kg)   SpO2 98%   BMI 29.87 kg/m                 Constitutional: Pt appears in NAD               HENT: Head: NCAT.                Right Ear: External ear normal.                 Left Ear: External ear normal.                Eyes: . Pupils are equal, round, and reactive to light. Conjunctivae and EOM are normal               Nose: without d/c or deformity               Neck: Neck supple. Gross normal ROM               Cardiovascular: Normal rate and regular rhythm.                 Pulmonary/Chest: Effort normal and breath sounds without rales or wheezing.                Abd:  Soft, NT, ND, + BS, no organomegaly               Neurological: Pt is alert. At baseline orientation, motor grossly intact               Skin: Skin is warm. No rashes, no other new lesions, LE edema - none               Psychiatric: Pt behavior is normal without agitation   Micro: none  Cardiac tracings I have personally interpreted today:  none  Pertinent Radiological findings (summarize): none   Lab Results  Component Value Date   WBC 3.4 (L) 04/26/2021   HGB 14.4 04/26/2021   HCT 42.8 04/26/2021   PLT 178.0 04/26/2021   GLUCOSE 98 04/26/2021   CHOL 131 06/20/2021   TRIG 135 06/20/2021   HDL 38 (L) 06/20/2021   LDLDIRECT 88.1 01/23/2011   LDLCALC 69 06/20/2021   ALT 6 06/20/2021   AST 10 06/20/2021   NA 138 04/26/2021   K 4.2 04/26/2021   CL 103 04/26/2021   CREATININE 0.97 04/26/2021   BUN 11 04/26/2021   CO2 28 04/26/2021   TSH 0.673 08/04/2020   PSA  0.26 07/30/2020   INR 1.5 (H) 08/04/2020   HGBA1C 5.9 03/29/2021   MICROALBUR 2.0 (H) 03/29/2021   Assessment/Plan:  Brian Mccullough is a 70 y.o. Black or African American [2] male with  has a past medical history of Allergy, CAD (coronary artery disease), Carotid artery disease ([place]), Hyperactive gag reflex, Hypertension, Inguinal hernia (01/2014), LBP (low back pain), Non-insulin dependent type 2 diabetes mellitus ([place]), OA (osteoarthritis) of knee (01/2004), Palpitations, s/p Inf STEMI (07/2020), and Vitamin B 12 deficiency.  Prostatism With recent worsening symptoms, afeb so for flomax for now, consider trial antibx for prostatitis if not improving, f/u pcp  HTN (hypertension) BP Readings from Last 3 Encounters:  07/20/21 (!) 146/70  07/01/21 120/60  05/13/21 132/70   Uncontrolled, ? Reactive vs other, pt to continue medical treatment lotensin, lopressor as declines change   Diabetes mellitus type 2, controlled ([place]) Lab Results  Component Value Date   HGBA1C 5.9 03/29/2021   Stable,  pt to continue current medical treatment  - metformin  Followup: Return if symptoms worsen or fail to improve.  Cathlean Cower, MD 07/23/2021 4:04 PM [facility]

## 2021-07-20 NOTE — Patient Instructions (Signed)
Please take all new medication as prescribed - the flomax for the prostate  Please call next wk if not improved, to consider a medication for the bladder called vesicare  Please continue all other medications as before, and refills have been done if requested.  Please have the pharmacy call with any other refills you may need.  Please keep your appointments with your specialists as you may have planned  Please be sure to keep your appt with Dr Alain Marion and the PSA testing next month

## 2021-07-23 ENCOUNTER — Encounter: Payer: Self-pay | Admitting: Internal Medicine

## 2021-07-23 NOTE — Assessment & Plan Note (Signed)
BP Readings from Last 3 Encounters:  07/20/21 (!) 146/70  07/01/21 120/60  05/13/21 132/70   Uncontrolled, ? Reactive vs other, pt to continue medical treatment lotensin, lopressor as declines change

## 2021-07-23 NOTE — Assessment & Plan Note (Signed)
Lab Results  Component Value Date   HGBA1C 5.9 03/29/2021   Stable, pt to continue current medical treatment  - metformin

## 2021-07-23 NOTE — Assessment & Plan Note (Signed)
With recent worsening symptoms, afeb so for flomax for now, consider trial antibx for prostatitis if not improving, f/u pcp

## 2021-07-25 ENCOUNTER — Telehealth: Payer: Self-pay

## 2021-07-25 NOTE — Telephone Encounter (Signed)
Please advise as the pt has stated he has started a new medication and is having a reaction to it. Tamsulosin (FLOMAX) 0.4 MG CAPS capsule on 07/20/2021. His reactions are dizziness, a small rash on side of rt knee and chills.  Pt is requesting to speak with you. Pt can be reached at [phone].

## 2021-08-01 ENCOUNTER — Ambulatory Visit: Payer: Medicare Other | Admitting: Internal Medicine

## 2021-08-01 NOTE — Telephone Encounter (Signed)
Left message,   Advised should patient still be having issues to discontinue tamsulosin at this time, patient to call back to discuss medication more in detail when available   Tomasa Blase, PharmD Clinical Pharmacist, [place]

## 2021-08-08 DIAGNOSIS — I10 Essential (primary) hypertension: Secondary | ICD-10-CM

## 2021-08-08 DIAGNOSIS — I25110 Atherosclerotic heart disease of native coronary artery with unstable angina pectoris: Secondary | ICD-10-CM

## 2021-08-08 DIAGNOSIS — E119 Type 2 diabetes mellitus without complications: Secondary | ICD-10-CM | POA: Diagnosis not present

## 2021-08-12 ENCOUNTER — Emergency Department (HOSPITAL_COMMUNITY): Payer: Medicare Other

## 2021-08-12 ENCOUNTER — Inpatient Hospital Stay (HOSPITAL_COMMUNITY)
Admission: EM | Admit: 2021-08-12 | Discharge: 2021-08-19 | DRG: 872 | Disposition: A | Discharge status: Home / Self Care | Payer: Medicare Other | Attending: Internal Medicine | Admitting: Internal Medicine

## 2021-08-12 ENCOUNTER — Encounter (HOSPITAL_COMMUNITY): Payer: Self-pay | Admitting: Emergency Medicine

## 2021-08-12 ENCOUNTER — Other Ambulatory Visit: Payer: Self-pay

## 2021-08-12 DIAGNOSIS — E785 Hyperlipidemia, unspecified: Secondary | ICD-10-CM | POA: Diagnosis present

## 2021-08-12 DIAGNOSIS — Z79899 Other long term (current) drug therapy: Secondary | ICD-10-CM

## 2021-08-12 DIAGNOSIS — Z885 Allergy status to narcotic agent status: Secondary | ICD-10-CM

## 2021-08-12 DIAGNOSIS — R6520 Severe sepsis without septic shock: Secondary | ICD-10-CM | POA: Diagnosis not present

## 2021-08-12 DIAGNOSIS — N12 Tubulo-interstitial nephritis, not specified as acute or chronic: Secondary | ICD-10-CM | POA: Diagnosis not present

## 2021-08-12 DIAGNOSIS — N390 Urinary tract infection, site not specified: Secondary | ICD-10-CM | POA: Diagnosis not present

## 2021-08-12 DIAGNOSIS — K5730 Diverticulosis of large intestine without perforation or abscess without bleeding: Secondary | ICD-10-CM | POA: Diagnosis not present

## 2021-08-12 DIAGNOSIS — E119 Type 2 diabetes mellitus without complications: Secondary | ICD-10-CM | POA: Diagnosis not present

## 2021-08-12 DIAGNOSIS — R300 Dysuria: Secondary | ICD-10-CM | POA: Diagnosis not present

## 2021-08-12 DIAGNOSIS — E1165 Type 2 diabetes mellitus with hyperglycemia: Secondary | ICD-10-CM | POA: Diagnosis not present

## 2021-08-12 DIAGNOSIS — I10 Essential (primary) hypertension: Secondary | ICD-10-CM | POA: Diagnosis present

## 2021-08-12 DIAGNOSIS — Z951 Presence of aortocoronary bypass graft: Secondary | ICD-10-CM

## 2021-08-12 DIAGNOSIS — R519 Headache, unspecified: Secondary | ICD-10-CM | POA: Diagnosis not present

## 2021-08-12 DIAGNOSIS — R197 Diarrhea, unspecified: Secondary | ICD-10-CM | POA: Diagnosis not present

## 2021-08-12 DIAGNOSIS — Z8249 Family history of ischemic heart disease and other diseases of the circulatory system: Secondary | ICD-10-CM | POA: Diagnosis not present

## 2021-08-12 DIAGNOSIS — E871 Hypo-osmolality and hyponatremia: Secondary | ICD-10-CM | POA: Diagnosis present

## 2021-08-12 DIAGNOSIS — Z23 Encounter for immunization: Secondary | ICD-10-CM | POA: Diagnosis not present

## 2021-08-12 DIAGNOSIS — Z7984 Long term (current) use of oral hypoglycemic drugs: Secondary | ICD-10-CM | POA: Diagnosis not present

## 2021-08-12 DIAGNOSIS — E876 Hypokalemia: Secondary | ICD-10-CM | POA: Diagnosis present

## 2021-08-12 DIAGNOSIS — R531 Weakness: Secondary | ICD-10-CM | POA: Diagnosis not present

## 2021-08-12 DIAGNOSIS — R509 Fever, unspecified: Secondary | ICD-10-CM | POA: Diagnosis not present

## 2021-08-12 DIAGNOSIS — Z20822 Contact with and (suspected) exposure to covid-19: Secondary | ICD-10-CM | POA: Diagnosis not present

## 2021-08-12 DIAGNOSIS — R000 Tachycardia, unspecified: Secondary | ICD-10-CM | POA: Diagnosis not present

## 2021-08-12 DIAGNOSIS — R059 Cough, unspecified: Secondary | ICD-10-CM | POA: Diagnosis not present

## 2021-08-12 DIAGNOSIS — Z8719 Personal history of other diseases of the digestive system: Secondary | ICD-10-CM | POA: Diagnosis not present

## 2021-08-12 DIAGNOSIS — Z87891 Personal history of nicotine dependence: Secondary | ICD-10-CM

## 2021-08-12 DIAGNOSIS — N400 Enlarged prostate without lower urinary tract symptoms: Secondary | ICD-10-CM

## 2021-08-12 DIAGNOSIS — A4152 Sepsis due to Pseudomonas: Secondary | ICD-10-CM | POA: Diagnosis not present

## 2021-08-12 DIAGNOSIS — N321 Vesicointestinal fistula: Secondary | ICD-10-CM | POA: Diagnosis not present

## 2021-08-12 DIAGNOSIS — Z7982 Long term (current) use of aspirin: Secondary | ICD-10-CM

## 2021-08-12 DIAGNOSIS — N200 Calculus of kidney: Secondary | ICD-10-CM | POA: Diagnosis not present

## 2021-08-12 DIAGNOSIS — I252 Old myocardial infarction: Secondary | ICD-10-CM | POA: Diagnosis not present

## 2021-08-12 DIAGNOSIS — R350 Frequency of micturition: Secondary | ICD-10-CM | POA: Diagnosis not present

## 2021-08-12 DIAGNOSIS — Z96652 Presence of left artificial knee joint: Secondary | ICD-10-CM | POA: Diagnosis present

## 2021-08-12 DIAGNOSIS — R7989 Other specified abnormal findings of blood chemistry: Secondary | ICD-10-CM | POA: Diagnosis present

## 2021-08-12 DIAGNOSIS — Z7902 Long term (current) use of antithrombotics/antiplatelets: Secondary | ICD-10-CM

## 2021-08-12 DIAGNOSIS — Z833 Family history of diabetes mellitus: Secondary | ICD-10-CM | POA: Diagnosis not present

## 2021-08-12 DIAGNOSIS — N10 Acute tubulo-interstitial nephritis: Secondary | ICD-10-CM | POA: Diagnosis not present

## 2021-08-12 DIAGNOSIS — N401 Enlarged prostate with lower urinary tract symptoms: Secondary | ICD-10-CM | POA: Diagnosis present

## 2021-08-12 DIAGNOSIS — K8020 Calculus of gallbladder without cholecystitis without obstruction: Secondary | ICD-10-CM | POA: Diagnosis not present

## 2021-08-12 DIAGNOSIS — A419 Sepsis, unspecified organism: Principal | ICD-10-CM | POA: Diagnosis present

## 2021-08-12 DIAGNOSIS — I2510 Atherosclerotic heart disease of native coronary artery without angina pectoris: Secondary | ICD-10-CM | POA: Diagnosis not present

## 2021-08-12 DIAGNOSIS — R319 Hematuria, unspecified: Secondary | ICD-10-CM | POA: Diagnosis not present

## 2021-08-12 DIAGNOSIS — N179 Acute kidney failure, unspecified: Secondary | ICD-10-CM | POA: Diagnosis present

## 2021-08-12 DIAGNOSIS — B965 Pseudomonas (aeruginosa) (mallei) (pseudomallei) as the cause of diseases classified elsewhere: Secondary | ICD-10-CM | POA: Diagnosis not present

## 2021-08-12 LAB — COMPREHENSIVE METABOLIC PANEL
ALT: 20 U/L (ref 0–44)
AST: 31 U/L (ref 15–41)
Albumin: 2.9 g/dL — ABNORMAL LOW (ref 3.5–5.0)
Alkaline Phosphatase: 70 U/L (ref 38–126)
Anion gap: 10 (ref 5–15)
BUN: 20 mg/dL (ref 8–23)
CO2: 20 mmol/L — ABNORMAL LOW (ref 22–32)
Calcium: 8.4 mg/dL — ABNORMAL LOW (ref 8.9–10.3)
Chloride: 100 mmol/L (ref 98–111)
Creatinine, Ser: 1.42 mg/dL — ABNORMAL HIGH (ref 0.61–1.24)
GFR, Estimated: 53 mL/min — ABNORMAL LOW (ref >60)
Glucose, Bld: 105 mg/dL — ABNORMAL HIGH (ref 70–99)
Potassium: 3.9 mmol/L (ref 3.5–5.1)
Sodium: 130 mmol/L — ABNORMAL LOW (ref 135–145)
Total Bilirubin: 2 mg/dL — ABNORMAL HIGH (ref 0.3–1.2)
Total Protein: 6.9 g/dL (ref 6.5–8.1)

## 2021-08-12 LAB — CBC WITH DIFFERENTIAL/PLATELET
Abs Immature Granulocytes: 0.07 10*3/uL (ref 0.00–0.07)
Basophils Absolute: 0 10*3/uL (ref 0.0–0.1)
Basophils Relative: 0 %
Eosinophils Absolute: 0 10*3/uL (ref 0.0–0.5)
Eosinophils Relative: 0 %
HCT: 39.6 % (ref 39.0–52.0)
Hemoglobin: 13.3 g/dL (ref 13.0–17.0)
Immature Granulocytes: 1 %
Lymphocytes Relative: 9 %
Lymphs Abs: 0.9 10*3/uL (ref 0.7–4.0)
MCH: 28.7 pg (ref 26.0–34.0)
MCHC: 33.6 g/dL (ref 30.0–36.0)
MCV: 85.3 fL (ref 80.0–100.0)
Monocytes Absolute: 2 10*3/uL — ABNORMAL HIGH (ref 0.1–1.0)
Monocytes Relative: 19 %
Neutro Abs: 7.2 10*3/uL (ref 1.7–7.7)
Neutrophils Relative %: 71 %
Platelets: 195 10*3/uL (ref 150–400)
RBC: 4.64 MIL/uL (ref 4.22–5.81)
RDW: 13.7 % (ref 11.5–15.5)
WBC: 10.1 10*3/uL (ref 4.0–10.5)
nRBC: 0 % (ref 0.0–0.2)

## 2021-08-12 LAB — URINALYSIS, ROUTINE W REFLEX MICROSCOPIC
Bilirubin Urine: NEGATIVE
Glucose, UA: NEGATIVE mg/dL
Ketones, ur: NEGATIVE mg/dL
Nitrite: NEGATIVE
Protein, ur: 100 mg/dL — AB
Specific Gravity, Urine: 1.015 (ref 1.005–1.030)
WBC, UA: >50 WBC/hpf — ABNORMAL HIGH (ref 0–5)
pH: 6 (ref 5.0–8.0)

## 2021-08-12 LAB — RESP PANEL BY RT-PCR (FLU A&B, COVID) ARPGX2
Influenza A by PCR: NEGATIVE
Influenza B by PCR: NEGATIVE
SARS Coronavirus 2 by RT PCR: NEGATIVE

## 2021-08-12 MED ORDER — SODIUM CHLORIDE 0.9 % IV BOLUS
1000.0000 mL | Freq: Once | INTRAVENOUS | Status: AC
  Administered 2021-08-12: 1000 mL via INTRAVENOUS

## 2021-08-12 MED ORDER — IBUPROFEN 800 MG PO TABS
800.0000 mg | ORAL_TABLET | Freq: Once | ORAL | Status: AC
  Administered 2021-08-12: 800 mg via ORAL
  Filled 2021-08-12: qty 1

## 2021-08-12 MED ORDER — LACTATED RINGERS IV SOLN: INTRAVENOUS | Status: DC

## 2021-08-12 MED ORDER — ACETAMINOPHEN 325 MG PO TABS
325.0000 mg | ORAL_TABLET | Freq: Once | ORAL | Status: DC
  Filled 2021-08-12: qty 1

## 2021-08-12 MED ORDER — TAMSULOSIN HCL 0.4 MG PO CAPS
0.4000 mg | ORAL_CAPSULE | Freq: Once | ORAL | Status: AC
  Administered 2021-08-12: 0.4 mg via ORAL
  Filled 2021-08-12: qty 1

## 2021-08-12 MED ORDER — SODIUM CHLORIDE 0.9 % IV BOLUS (SEPSIS)
1000.0000 mL | Freq: Once | INTRAVENOUS | Status: AC
  Administered 2021-08-13: 1000 mL via INTRAVENOUS

## 2021-08-12 MED ORDER — ACETAMINOPHEN 325 MG PO TABS
650.0000 mg | ORAL_TABLET | Freq: Once | ORAL | Status: AC
  Administered 2021-08-12: 650 mg via ORAL

## 2021-08-12 MED ORDER — PIPERACILLIN-TAZOBACTAM 3.375 G IVPB 30 MIN
3.3750 g | Freq: Once | INTRAVENOUS | Status: AC
  Administered 2021-08-13: 3.375 g via INTRAVENOUS
  Filled 2021-08-12: qty 50

## 2021-08-12 NOTE — ED Triage Notes (Signed)
Pt arrived with family at bedside, pt reports HA, fever, chills, malaise and multiple episodes of diarrhea x 3 days, no sick contacts.  Pt also had issue with prostate enlargement, tx by Dr. Jenny Reichmann but stopped taking Flomax d/t dizziness. Pt reports he was not taking medication at night, pt and family educated on taking mediation at night before bed.

## 2021-08-12 NOTE — ED Provider Notes (Signed)
[facility] EMERGENCY DEPARTMENT Provider Note   CSN: [id number] Arrival date & time: 08/12/21  1149     History No chief complaint on file.   Brian Mccullough is a 70 y.o. male.  HPI  70 year old male past medical history of CAD, HTN, HLD, DM, BPH supposed to be on Flomax presents the emergency department with fever/chills, fatigue, suprapubic discomfort and dysuria.  Patient states the symptoms been going on for the past 4 days.  He stopped taking his Flomax about a week ago because it was making him dizzy.  Since then he does not feel like he has been fully relieving himself, has developed suprapubic discomfort with dysuria.  He also has intermittent bilateral flank pain.  He had 1 episode of diarrhea the other day but denies any ongoing vomiting/diarrhea.  He is felt warm at home without documented fever.  Past Medical History:  Diagnosis Date   Allergy    CAD (coronary artery disease)    s/p Inf STEMI 10/21>>POBA to RCA >> s/p CABG (L-LAD, S-PDA, L Radial-OM1/RI, RIMA-RPLA) // post op AF; SVT >> Amiod Rx // Echo 10/21: EF 55-60, no RWMA, GLS -18.5, normal RVSF, mild LAE, trivial MR    Carotid artery disease ([place])    [place] 10/21: R 1-39; L 40-59   Hyperactive gag reflex    Hypertension    under control with meds., has been on med. x 30 yr.   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   STEMI (ST elevation myocardial  infarction) ([place]) 08/03/2020   STEMI involving right coronary artery ([place]) 08/03/2020   Trigger thumb of right hand 04/26/2020   Testicular pain, right 03/18/2018   Lightheadedness 04/09/2017   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   Stress at home 03/27/2016   Bladder neck obstruction 05/29/2014   [place] 05/29/2014   Acute bronchitis 04/19/2014   Traveler's diarrhea 02/02/2014   Right inguinal hernia 01/13/2014   Recurrent left inguinal hernia 01/13/2014   Rt groin pain 12/30/2013   Palpitations 06/19/2013   Right groin pain 03/27/2013   Well adult exam 10/07/2012   Hypogonadism male 05/29/2012   Erectile dysfunction 05/27/2012   Insomnia 05/29/2011   WEIGHT GAIN, ABNORMAL 05/30/2010   Dyslipidemia 01/31/2010   CHEST PAIN 01/31/2010   Groin pain, chronic, left 11/02/2009   LOW BACK PAIN 11/02/2009   DIZZINESS 03/02/2009   B12 deficiency 11/02/2008   ECZEMA 11/02/2008   Vitamin D deficiency 01/27/2008   Obesity 01/27/2008   ANXIETY 10/28/2007   ALCOHOLISM 10/28/2007   Osteoarthritis 10/28/2007   Diabetes mellitus type 2, controlled ([place]) 05/10/2007   HTN (hypertension) 05/10/2007    Past Surgical History:  Procedure Laterality Date   CARDIAC CATHETERIZATION     CORONARY ARTERY BYPASS GRAFT N/A 08/04/2020   Procedure: CORONARY ARTERY BYPASS GRAFTING (CABG), ON PUMP, TIMES SIX, USING  BILATERAL INTERNAL MAMMARY ARTERIES, RIGHT ENDOSCOPICALLY HARVESTED GREATER SAPHENOUS VEIN, AND RIGHT RADIAL ARTERY OPEN HARVEST;  Surgeon: Wonda Olds, MD;  Location: [facility];  Service: Open Heart Surgery;  Laterality: N/A;   CORONARY/GRAFT ACUTE MI REVASCULARIZATION N/A 08/03/2020   Procedure: Coronary/Graft Acute MI Revascularization;  Surgeon: Sherren Mocha, MD;  Location: [facility];  Service: Cardiovascular;  Laterality: N/A;   ENDOVEIN HARVEST OF GREATER SAPHENOUS VEIN Right 08/04/2020   Procedure: ENDOVEIN HARVEST OF GREATER SAPHENOUS VEIN;  Surgeon: Wonda Olds, MD;   Location: [facility];  Service: Open Heart Surgery;  Laterality: Right;   INGUINAL HERNIA REPAIR  age 36-5   INGUINAL HERNIA REPAIR Bilateral 02/09/2014   Procedure: OPEN BILATERAL INGUINAL HERNIA REPAIR WITH MESH;  Surgeon: [name], MD;  Location: [facility];  Service: General;  Laterality: Bilateral;   INSERTION OF MESH Bilateral 02/09/2014   Procedure: INSERTION OF MESH;  Surgeon: [name], MD;  Location: [facility];  Service: General;  Laterality: Bilateral;   KNEE ARTHROSCOPY Right    LEFT HEART CATH AND CORONARY ANGIOGRAPHY N/A 08/03/2020   Procedure: LEFT HEART CATH AND CORONARY ANGIOGRAPHY;  Surgeon: Sherren Mocha, MD;  Location: [facility];  Service: Cardiovascular;  Laterality: N/A;   ORCHIECTOMY Left 05/31/2017   Procedure: LEFT RADICAL ORCHIECTOMY;  Surgeon: Irine Seal, MD;  Location[facility];  Service: Urology;  Laterality: Left;   RADIAL ARTERY HARVEST Right 08/04/2020   Procedure: RADIAL ARTERY HARVEST;  Surgeon: Wonda Olds, MD;  Location: [facility];  Service: Open Heart Surgery;  Laterality: Right;   TEE WITHOUT CARDIOVERSION N/A 08/04/2020   Procedure: TRANSESOPHAGEAL ECHOCARDIOGRAM (TEE);  Surgeon: Wonda Olds, MD;  Location: [facility];  Service: Open Heart Surgery;  Laterality: N/A;   TONSILLECTOMY AND ADENOIDECTOMY  1960   TOTAL KNEE ARTHROPLASTY Left 01/14/2004       Family History  Problem Relation Age of Onset   Hypertension Mother    Diabetes Mother    Diabetes Father    Colon cancer Neg Hx    Esophageal cancer Neg Hx    Rectal cancer Neg Hx    Stomach cancer Neg Hx     Social History   Tobacco Use   Smoking status: Former    Types: Cigars    Quit date: 08/03/2020    Years since quitting: 1.0   Smokeless tobacco: Never   Tobacco comments:    2-3 small cigars/day  Vaping Use   Vaping Use: Never used  Substance Use Topics   Alcohol use: No   Drug use: No    Home  Medications Prior to Admission medications   Medication Sig Start Date End Date Taking? Authorizing Provider  aspirin 81 MG EC tablet Take 81 mg by mouth daily.    [provider]  atorvastatin (LIPITOR) 40 MG tablet Take 1 tablet (40 mg total) by mouth daily. Patient taking differently: Take 40 mg by mouth every other day. 03/16/21   Richardson Dopp T, PA-C  benazepril (LOTENSIN) 20 MG tablet TAKE 1 TABLET BY MOUTH  DAILY 06/03/21   [name], Evie Lacks, MD  Cholecalciferol (VITAMIN D3) 1000 UNITS tablet Take 1,000 Units by mouth daily.    [provider]  clopidogrel (PLAVIX) 75 MG tablet Take 1 tablet (75 mg total) by mouth daily. 11/29/20   Sherren Mocha, MD  cyanocobalamin 1000 MCG tablet Take 1,000 mcg by mouth daily.    [provider]  furosemide (LASIX) 20 MG tablet TAKE 1 TABLET BY MOUTH AS NEEDED FOR FLUID OR EDEMA 02/23/21   Richardson Dopp T, PA-C  loratadine (CLARITIN) 10 MG tablet Take 10 mg by mouth daily as needed for allergies or rhinitis.    [provider]  meloxicam (MOBIC) 15 MG tablet TAKE 1 TABLET BY MOUTH  DAILY AS NEEDED FOR PAIN 05/05/21   [name], Evie Lacks, MD  metFORMIN (GLUCOPHAGE) 500 MG tablet TAKE 1 TABLET BY MOUTH  DAILY WITH BREAKFAST 06/07/21   [name], Evie Lacks, MD  metoprolol tartrate (LOPRESSOR) 25 MG tablet Take 1 tablet (25 mg total) by mouth 2 (two) times daily. 07/01/21 09/29/21  Richardson Dopp T, PA-C  potassium chloride SA (KLOR-CON) 20 MEQ tablet TAKE 1 TABLET BY MOUTH  DAILY 06/07/21   [name], Evie Lacks, MD  tamsulosin (FLOMAX) 0.4 MG CAPS capsule Take 1 capsule (0.4 mg total) by mouth daily. 07/20/21   Biagio Borg, MD  tiZANidine (ZANAFLEX) 4 MG tablet TAKE 1 TABLET BY MOUTH  EVERY 8 HOURS AS NEEDED FOR MUSCLE SPASM(S) 06/07/21   [name], Evie Lacks, MD  traMADol (ULTRAM) 50 MG tablet TAKE 1 TO 2 TABLETS BY MOUTH TWICE DAILY AS NEEDED Patient taking differently: Take 50 mg by mouth 2 (two) times daily. TAKE 1 TO 2  TABLETS BY MOUTH TWICE DAILY AS NEEDED 12/06/20   [name], Evie Lacks, MD  traZODone (DESYREL) 50 MG tablet TAKE 1/2 TO 1 TABLET BY  MOUTH AT BEDTIME AS NEEDED  FOR SLEEP Patient taking differently: TAKE 1/2 TO 1 TABLET BY  MOUTH AT BEDTIME AS NEEDED  FOR SLEEP 06/03/21   [name], Evie Lacks, MD    Allergies    Codeine  Review of Systems   Review of Systems  Constitutional:  Positive for chills, fatigue and fever.  HENT:  Negative for congestion.   Eyes:  Negative for visual disturbance.  Respiratory:  Negative for shortness of breath.   Cardiovascular:  Negative for chest pain.  Gastrointestinal:  Positive for abdominal pain and diarrhea. Negative for vomiting.  Genitourinary:  Positive for dysuria, flank pain and frequency.  Skin:  Negative for rash.  Neurological:  Negative for headaches.   Physical Exam Updated Vital Signs BP 135/83 (BP Location: Right Arm)   Pulse 91   Temp 100.3 F (37.9 C) (Oral)   Resp 20   SpO2 100%   Physical Exam Vitals and nursing note reviewed.  Constitutional:      General: He is not in acute distress.    Appearance: Normal appearance.  HENT:     Head: Normocephalic.     Mouth/Throat:     Mouth: Mucous membranes are moist.  Cardiovascular:     Rate and Rhythm: Normal rate.  Pulmonary:     Effort: Pulmonary effort is normal. No respiratory distress.  Abdominal:     General: There is no distension.     Palpations: Abdomen is soft.     Tenderness: There is no abdominal tenderness.  Skin:    General: Skin is warm.  Neurological:     Mental Status: He is alert and oriented to person, place, and time. Mental status is at baseline.  Psychiatric:        Mood and Affect: Mood normal.    ED Results / Procedures / Treatments   Labs (all labs ordered are listed, but only abnormal results are displayed) Labs Reviewed  CBC WITH DIFFERENTIAL/PLATELET - Abnormal; Notable for the following components:  Result Value   Monocytes Absolute 2.0  (*)    All other components within normal limits  COMPREHENSIVE METABOLIC PANEL - Abnormal; Notable for the following components:   Sodium 130 (*)    CO2 20 (*)    Glucose, Bld 105 (*)    Creatinine, Ser 1.42 (*)    Calcium 8.4 (*)    Albumin 2.9 (*)    Total Bilirubin 2.0 (*)    GFR, Estimated 53 (*)    All other components within normal limits  URINALYSIS, ROUTINE W REFLEX MICROSCOPIC - Abnormal; Notable for the following components:   Color, Urine AMBER (*)    APPearance CLOUDY (*)    Hgb urine dipstick MODERATE (*)    Protein, ur 100 (*)    Leukocytes,Ua LARGE (*)    WBC, UA >50 (*)    Bacteria, UA MANY (*)    Non Squamous Epithelial 0-5 (*)    All other components within normal limits  RESP PANEL BY RT-PCR (FLU A&B, COVID) [id number]    EKG EKG Interpretation  Date/Time:  Friday August 12 2021 13:27:11 EDT Ventricular Rate:  117 PR Interval:  144 QRS Duration: 96 QT Interval:  320 QTC Calculation: 446 R Axis:   -86 Text Interpretation: Sinus tachycardia with frequent Premature ventricular complexes Possible Left atrial enlargement Incomplete right bundle branch block Left anterior fascicular block Inferior infarct , age undetermined Abnormal ECG Confirmed by Lavenia Atlas [id number]) on 08/12/2021 8:40:16 PM  Radiology DG Chest 2 View  Result Date: 08/12/2021 CLINICAL DATA:  Cough and headache.  Diarrhea.  Fever and weakness. EXAM: CHEST - 2 VIEW COMPARISON:  08/16/2020 FINDINGS: Previous median sternotomy and CABG procedure. Heart size is normal. No pleural effusion or edema. No airspace densities. Spondylosis identified within the thoracic spine with large flowing ventral osteophytes. IMPRESSION: No active cardiopulmonary abnormalities. Electronically Signed   By: Kerby Moors M.D.   On: 08/12/2021 14:55   CT Renal Stone Study  Result Date: 08/12/2021 CLINICAL DATA:  Hematuria. EXAM: CT ABDOMEN AND PELVIS WITHOUT CONTRAST TECHNIQUE: Multidetector CT imaging of the  abdomen and pelvis was performed following the standard protocol without IV contrast. COMPARISON:  CT abdomen pelvis report dated 05/02/2000. The images are not available for direct comparison. FINDINGS: Evaluation of this exam is limited in the absence of intravenous contrast. Lower chest: The visualized lung bases are clear. There is coronary vascular calcification. No intra-abdominal free air or free fluid. Hepatobiliary: The liver is unremarkable. No intrahepatic biliary dilatation. Small gallstones. No pericholecystic fluid or evidence of acute cholecystitis by CT. Pancreas: Unremarkable. No pancreatic ductal dilatation or surrounding inflammatory changes. Spleen: Normal in size without focal abnormality. Adrenals/Urinary Tract: The adrenal glands are unremarkable. There is a 9 mm nonobstructing left renal inferior pole calculus. No hydronephrosis. There is no hydronephrosis or nephrolithiasis on the right. Faint area of hypodensity in the right renal parenchyma is not evaluated but may represent a cyst or focal area of edema or developing abscess. There is right perinephric stranding. Correlation with urinalysis recommended to evaluate for possibility of pyelonephritis. The visualized ureters appear unremarkable. There is diffuse haziness of the bladder wall concerning for cystitis. There is loss of fat plane between the sigmoid colon and bladder dome with air within the urinary bladder most consistent with a colovesical fistula. Stomach/Bowel: There is sigmoid diverticulosis without definite active inflammation. There is no bowel obstruction. The appendix is normal. Vascular/Lymphatic: Moderate aortoiliac atherosclerotic disease. The IVC is unremarkable. No portal venous gas. There is  no adenopathy. Reproductive: The prostate and seminal vesicles are grossly unremarkable. No pelvic mass. Other: None Musculoskeletal: Degenerative changes of the spine. No acute osseous pathology. IMPRESSION: 1. Sigmoid  diverticulosis with colovesical fistula and findings of UTI and right-sided pyelonephritis. Developing abscess in the right kidney is not excluded. 2. A 9 mm nonobstructing left renal inferior pole calculus. No hydronephrosis. 3. Cholelithiasis. 4. No bowel obstruction. Normal appendix. 5. Aortic Atherosclerosis (ICD10-I70.0). Electronically Signed   By: Anner Crete M.D.   On: 08/12/2021 21:55    Procedures .Critical Care Performed by: Lorelle Gibbs, DO Authorized by: Lorelle Gibbs, DO   Critical care provider statement:    Critical care time (minutes):  45   Critical care time was exclusive of:  Separately billable procedures and treating other patients   Critical care was necessary to treat or prevent imminent or life-threatening deterioration of the following conditions:  Sepsis   Critical care was time spent personally by me on the following activities:  Ordering and performing treatments and interventions, ordering and review of laboratory studies, ordering and review of radiographic studies, re-evaluation of patient's condition, examination of patient, evaluation of patient's response to treatment and discussions with consultants   I assumed direction of critical care for this patient from another provider in my specialty: no     Care discussed with: admitting provider     Medications Ordered in ED Medications  tamsulosin (FLOMAX) capsule 0.4 mg (has no administration in time range)  sodium chloride 0.9 % bolus 1,000 mL (has no administration in time range)  acetaminophen (TYLENOL) tablet 650 mg (650 mg Oral Given 08/12/21 1707)    ED Course  I have reviewed the triage vital signs and the nursing notes.  Pertinent labs & imaging results that were available during my care of the patient were reviewed by me and considered in my medical decision making (see chart for details).    MDM Rules/Calculators/A&P                           70 year old male presents the emergency  department with fever, fatigue, suprapubic discomfort.  Febrile and tachycardic on arrival.  Blood work shows hyponatremia, slightly elevated creatinine 1.42.  No leukocytosis.  Urinalysis has large leukocytes and many bacteria suspicious for UTI.  CT of the abdomen pelvis shows findings of cystitis with right perinephric stranding.  They also mention a hypodense area that is not fully evaluated that could be developing abscess.  They also have concern for possible colovesical fistula with air in the bladder.  Consulted with urology resident Dr. Lahoma Crocker who reviewed the CT and the patient's presentation.  On my reevaluation the patient's fever has returned.  Given the abnormal findings of the CAT scan they recommend admission, IV antibiotics and then will evaluate in the morning.  After reviewing the CT scan they also have concern for possible diverticulitis in addition to cystitis.  We will place the patient on antibiotics to potentially cover for both and admit.  Patients evaluation and results requires admission for further treatment and care. Patient agrees with admission plan, offers no new complaints and is stable/unchanged at time of admit.  Final Clinical Impression(s) / ED Diagnoses Final diagnoses:  None    Rx / DC Orders ED Discharge Orders     None        Lorelle Gibbs, DO 08/12/21 2348

## 2021-08-12 NOTE — ED Provider Notes (Signed)
Emergency Medicine Provider Triage Evaluation Note  Brian Mccullough , a 70 y.o. male  was evaluated in triage.  Pt complains of fever, headache, diarrhea, fatigue.  Symptoms have been going on for 4 days.  No sick contacts.  Reports a mild, nonproductive cough.  No nausea, vomiting.  Review of Systems  Positive: Fever, HA, diarrhea Negative: cp  Physical Exam  BP 112/77 (BP Location: Left Arm)   Pulse (!) 113   Temp 100.1 F (37.8 C) (Oral)   Resp 19   SpO2 100%  Gen:   Awake, no distress   Resp:  Normal effort  MSK:   Moves extremities without difficulty  Other:  No ttp of the abd  Medical Decision Making  Medically screening exam initiated at 1:29 PM.  Appropriate orders placed.  [name] was informed that the remainder of the evaluation will be completed by another provider, this initial triage assessment does not replace that evaluation, and the importance of remaining in the ED until their evaluation is complete.  Labs, resp panel, cxr, ekg   Taeya Theall, PA-C 08/12/21 1330    Daleen Bo, MD 08/13/21 1729

## 2021-08-13 ENCOUNTER — Encounter (HOSPITAL_COMMUNITY): Payer: Self-pay | Admitting: Family Medicine

## 2021-08-13 DIAGNOSIS — N12 Tubulo-interstitial nephritis, not specified as acute or chronic: Secondary | ICD-10-CM | POA: Diagnosis not present

## 2021-08-13 DIAGNOSIS — Z833 Family history of diabetes mellitus: Secondary | ICD-10-CM | POA: Diagnosis not present

## 2021-08-13 DIAGNOSIS — I252 Old myocardial infarction: Secondary | ICD-10-CM | POA: Diagnosis not present

## 2021-08-13 DIAGNOSIS — Z951 Presence of aortocoronary bypass graft: Secondary | ICD-10-CM | POA: Diagnosis not present

## 2021-08-13 DIAGNOSIS — E876 Hypokalemia: Secondary | ICD-10-CM | POA: Diagnosis present

## 2021-08-13 DIAGNOSIS — N179 Acute kidney failure, unspecified: Secondary | ICD-10-CM

## 2021-08-13 DIAGNOSIS — Z8249 Family history of ischemic heart disease and other diseases of the circulatory system: Secondary | ICD-10-CM | POA: Diagnosis not present

## 2021-08-13 DIAGNOSIS — E785 Hyperlipidemia, unspecified: Secondary | ICD-10-CM | POA: Diagnosis present

## 2021-08-13 DIAGNOSIS — N400 Enlarged prostate without lower urinary tract symptoms: Secondary | ICD-10-CM

## 2021-08-13 DIAGNOSIS — A419 Sepsis, unspecified organism: Secondary | ICD-10-CM | POA: Diagnosis present

## 2021-08-13 DIAGNOSIS — Z23 Encounter for immunization: Secondary | ICD-10-CM | POA: Diagnosis not present

## 2021-08-13 DIAGNOSIS — K5730 Diverticulosis of large intestine without perforation or abscess without bleeding: Secondary | ICD-10-CM | POA: Diagnosis present

## 2021-08-13 DIAGNOSIS — Z20822 Contact with and (suspected) exposure to covid-19: Secondary | ICD-10-CM | POA: Diagnosis present

## 2021-08-13 DIAGNOSIS — N10 Acute tubulo-interstitial nephritis: Secondary | ICD-10-CM | POA: Diagnosis present

## 2021-08-13 DIAGNOSIS — R6520 Severe sepsis without septic shock: Secondary | ICD-10-CM | POA: Diagnosis present

## 2021-08-13 DIAGNOSIS — I2510 Atherosclerotic heart disease of native coronary artery without angina pectoris: Secondary | ICD-10-CM | POA: Diagnosis present

## 2021-08-13 DIAGNOSIS — E871 Hypo-osmolality and hyponatremia: Secondary | ICD-10-CM

## 2021-08-13 DIAGNOSIS — Z7984 Long term (current) use of oral hypoglycemic drugs: Secondary | ICD-10-CM | POA: Diagnosis not present

## 2021-08-13 DIAGNOSIS — E1165 Type 2 diabetes mellitus with hyperglycemia: Secondary | ICD-10-CM | POA: Diagnosis not present

## 2021-08-13 DIAGNOSIS — I10 Essential (primary) hypertension: Secondary | ICD-10-CM | POA: Diagnosis present

## 2021-08-13 DIAGNOSIS — E119 Type 2 diabetes mellitus without complications: Secondary | ICD-10-CM | POA: Diagnosis present

## 2021-08-13 DIAGNOSIS — B965 Pseudomonas (aeruginosa) (mallei) (pseudomallei) as the cause of diseases classified elsewhere: Secondary | ICD-10-CM | POA: Diagnosis present

## 2021-08-13 DIAGNOSIS — N401 Enlarged prostate with lower urinary tract symptoms: Secondary | ICD-10-CM | POA: Diagnosis present

## 2021-08-13 DIAGNOSIS — Z8719 Personal history of other diseases of the digestive system: Secondary | ICD-10-CM | POA: Diagnosis not present

## 2021-08-13 DIAGNOSIS — N321 Vesicointestinal fistula: Secondary | ICD-10-CM | POA: Diagnosis present

## 2021-08-13 DIAGNOSIS — R7989 Other specified abnormal findings of blood chemistry: Secondary | ICD-10-CM | POA: Diagnosis present

## 2021-08-13 LAB — BASIC METABOLIC PANEL
Anion gap: 9 (ref 5–15)
BUN: 26 mg/dL — ABNORMAL HIGH (ref 8–23)
CO2: 19 mmol/L — ABNORMAL LOW (ref 22–32)
Calcium: 8.2 mg/dL — ABNORMAL LOW (ref 8.9–10.3)
Chloride: 104 mmol/L (ref 98–111)
Creatinine, Ser: 1.36 mg/dL — ABNORMAL HIGH (ref 0.61–1.24)
GFR, Estimated: 56 mL/min — ABNORMAL LOW (ref >60)
Glucose, Bld: 110 mg/dL — ABNORMAL HIGH (ref 70–99)
Potassium: 3.8 mmol/L (ref 3.5–5.1)
Sodium: 132 mmol/L — ABNORMAL LOW (ref 135–145)

## 2021-08-13 LAB — CBC
HCT: 34.8 % — ABNORMAL LOW (ref 39.0–52.0)
Hemoglobin: 11.7 g/dL — ABNORMAL LOW (ref 13.0–17.0)
MCH: 28.5 pg (ref 26.0–34.0)
MCHC: 33.6 g/dL (ref 30.0–36.0)
MCV: 84.7 fL (ref 80.0–100.0)
Platelets: 166 10*3/uL (ref 150–400)
RBC: 4.11 MIL/uL — ABNORMAL LOW (ref 4.22–5.81)
RDW: 13.9 % (ref 11.5–15.5)
WBC: 10.1 10*3/uL (ref 4.0–10.5)
nRBC: 0 % (ref 0.0–0.2)

## 2021-08-13 LAB — LACTIC ACID, PLASMA
Lactic Acid, Venous: 1.6 mmol/L (ref 0.5–1.9)
Lactic Acid, Venous: 1.7 mmol/L (ref 0.5–1.9)

## 2021-08-13 LAB — HIV ANTIBODY (ROUTINE TESTING W REFLEX): HIV Screen 4th Generation wRfx: NONREACTIVE

## 2021-08-13 MED ORDER — TAMSULOSIN HCL 0.4 MG PO CAPS
0.4000 mg | ORAL_CAPSULE | Freq: Every day | ORAL | Status: DC
  Administered 2021-08-13 – 2021-08-19 (×7): 0.4 mg via ORAL
  Filled 2021-08-13 (×7): qty 1

## 2021-08-13 MED ORDER — TRAZODONE HCL 50 MG PO TABS: 25.0000 mg | ORAL_TABLET | Freq: Every evening | ORAL | Status: DC | PRN

## 2021-08-13 MED ORDER — ONDANSETRON HCL 4 MG PO TABS: 4.0000 mg | ORAL_TABLET | Freq: Four times a day (QID) | ORAL | Status: DC | PRN

## 2021-08-13 MED ORDER — INFLUENZA VAC A&B SA ADJ QUAD 0.5 ML IM PRSY
0.5000 mL | PREFILLED_SYRINGE | INTRAMUSCULAR | Status: AC
  Administered 2021-08-18: 0.5 mL via INTRAMUSCULAR
  Filled 2021-08-13: qty 0.5

## 2021-08-13 MED ORDER — METFORMIN HCL 500 MG PO TABS
500.0000 mg | ORAL_TABLET | Freq: Every day | ORAL | Status: DC
  Administered 2021-08-13 – 2021-08-19 (×7): 500 mg via ORAL
  Filled 2021-08-13 (×7): qty 1

## 2021-08-13 MED ORDER — ENOXAPARIN SODIUM 40 MG/0.4ML IJ SOSY
40.0000 mg | PREFILLED_SYRINGE | INTRAMUSCULAR | Status: DC
  Administered 2021-08-13 – 2021-08-19 (×7): 40 mg via SUBCUTANEOUS
  Filled 2021-08-13 (×7): qty 0.4

## 2021-08-13 MED ORDER — CLOPIDOGREL BISULFATE 75 MG PO TABS
75.0000 mg | ORAL_TABLET | Freq: Every day | ORAL | Status: DC
  Administered 2021-08-13 – 2021-08-14 (×2): 75 mg via ORAL
  Filled 2021-08-13 (×3): qty 1

## 2021-08-13 MED ORDER — VITAMIN B-12 1000 MCG PO TABS
1000.0000 ug | ORAL_TABLET | Freq: Every day | ORAL | Status: DC
  Administered 2021-08-13 – 2021-08-19 (×7): 1000 ug via ORAL
  Filled 2021-08-13 (×7): qty 1

## 2021-08-13 MED ORDER — ATORVASTATIN CALCIUM 40 MG PO TABS
40.0000 mg | ORAL_TABLET | ORAL | Status: DC
  Administered 2021-08-13: 40 mg via ORAL
  Filled 2021-08-13 (×3): qty 1

## 2021-08-13 MED ORDER — HYDROMORPHONE HCL 1 MG/ML IJ SOLN
1.0000 mg | INTRAMUSCULAR | Status: DC | PRN
  Administered 2021-08-13 – 2021-08-14 (×3): 1 mg via INTRAVENOUS
  Filled 2021-08-13 (×3): qty 1

## 2021-08-13 MED ORDER — BENAZEPRIL HCL 20 MG PO TABS
20.0000 mg | ORAL_TABLET | Freq: Every day | ORAL | Status: DC
  Administered 2021-08-13 – 2021-08-14 (×2): 20 mg via ORAL
  Filled 2021-08-13 (×2): qty 1

## 2021-08-13 MED ORDER — PIPERACILLIN-TAZOBACTAM 3.375 G IVPB
3.3750 g | Freq: Three times a day (TID) | INTRAVENOUS | Status: DC
  Administered 2021-08-13 – 2021-08-17 (×13): 3.375 g via INTRAVENOUS
  Filled 2021-08-13 (×15): qty 50

## 2021-08-13 MED ORDER — SENNOSIDES-DOCUSATE SODIUM 8.6-50 MG PO TABS: 1.0000 | ORAL_TABLET | Freq: Every evening | ORAL | Status: DC | PRN

## 2021-08-13 MED ORDER — ASPIRIN EC 81 MG PO TBEC
81.0000 mg | DELAYED_RELEASE_TABLET | Freq: Every day | ORAL | Status: DC
  Administered 2021-08-13 – 2021-08-19 (×7): 81 mg via ORAL
  Filled 2021-08-13 (×7): qty 1

## 2021-08-13 MED ORDER — ACETAMINOPHEN 650 MG RE SUPP: 650.0000 mg | Freq: Four times a day (QID) | RECTAL | Status: DC | PRN

## 2021-08-13 MED ORDER — LACTATED RINGERS IV SOLN: INTRAVENOUS | Status: DC

## 2021-08-13 MED ORDER — METOPROLOL TARTRATE 25 MG PO TABS
25.0000 mg | ORAL_TABLET | Freq: Two times a day (BID) | ORAL | Status: DC
  Administered 2021-08-13 – 2021-08-14 (×2): 25 mg via ORAL
  Filled 2021-08-13 (×3): qty 1

## 2021-08-13 MED ORDER — ACETAMINOPHEN 325 MG PO TABS
650.0000 mg | ORAL_TABLET | Freq: Four times a day (QID) | ORAL | Status: DC | PRN
  Administered 2021-08-13 – 2021-08-17 (×6): 650 mg via ORAL
  Filled 2021-08-13 (×6): qty 2

## 2021-08-13 MED ORDER — ONDANSETRON HCL 4 MG/2ML IJ SOLN: 4.0000 mg | Freq: Four times a day (QID) | INTRAMUSCULAR | Status: DC | PRN

## 2021-08-13 MED ORDER — OXYCODONE HCL 5 MG PO TABS
5.0000 mg | ORAL_TABLET | Freq: Four times a day (QID) | ORAL | Status: DC | PRN
  Filled 2021-08-13 (×2): qty 1

## 2021-08-13 MED ORDER — IBUPROFEN 200 MG PO TABS: 600.0000 mg | ORAL_TABLET | Freq: Four times a day (QID) | ORAL | Status: DC | PRN

## 2021-08-13 NOTE — Plan of Care (Signed)
  Problem: Elimination: Goal: Will not experience complications related to bowel motility Outcome: Progressing   Problem: Pain Managment: Goal: General experience of comfort will improve Outcome: Progressing   Problem: Education: Goal: Knowledge of General Education information will improve Description: Including pain rating scale, medication(s)/side effects and non-pharmacologic comfort measures Outcome: Progressing   Problem: Safety: Goal: Ability to remain free from injury will improve Outcome: Progressing

## 2021-08-13 NOTE — Consult Note (Addendum)
Urology Consult   Physician requesting consult: Harrold Donath MD  Reason for consult: UTI, possible colovesical fistula   History of Present Illness: Brian Mccullough is a 70 y.o. male with history of CAD (s/p CABG '21), HTN, HLD, DM, BPH who presents to the ED due to fevers, dysuria, and abdominal pain x 1 week. Patient states that he has had irritative voiding symptoms for the last 3 months and was diagnosed with an enlarged prostate. He was started on flomax which significantly helped his symptoms, but he recently stopped taking it due to dizziness. He reports that approximately 1 week ago he started to develop diffuse abdominal pain with urinary urgency, frequency and dysuria. He reports pneumaturia and fecaluria during this time frame. Developed diarrhea 2 days ago and then subsequently fevers and chills prompting his presentation to the ED.  He reports no prior history of diverticulosis or diverticulitis. Has had a prior colonoscopy, was told he had polyps. He has no prior history of urinary tract infections.   In the ED he was febrile to 103.87F but otherwise hemodynamically stable. Labs notable for creatine of 1.4 (baseline 0.9), no leukocytosis with WBC 10, UA concerning for infection with large leukocytes, many bacteria, >50 WBC. 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 He has been admitted to the medicine service for antibiotics and supportive care.   Past Medical History:  Diagnosis Date   Allergy    CAD (coronary artery disease)    s/p Inf STEMI 10/21>>POBA to RCA >> s/p CABG (L-LAD, S-PDA, L Radial-OM1/RI, RIMA-RPLA) // post op AF; SVT >> Amiod Rx // Echo 10/21: EF 55-60, no RWMA, GLS -18.5, normal RVSF, mild LAE, trivial MR    Carotid artery disease ([place])    [place] 10/21: R 1-39; L 40-59   Hyperactive gag reflex    Hypertension    under control with meds., has been on med. x 30 yr.   Inguinal  hernia 01/2014   bilateral   LBP (low back pain)    Non-insulin dependent type 2 diabetes mellitus (HCC)    OA (osteoarthritis) of knee 01/2004   left   Palpitations    s/p Inf STEMI 07/2020   POBA to RCA >> CABG   Vitamin B 12 deficiency     Past Surgical History:  Procedure Laterality Date   CARDIAC CATHETERIZATION     CORONARY ARTERY BYPASS GRAFT N/A 08/04/2020   Procedure: CORONARY ARTERY BYPASS GRAFTING (CABG), ON PUMP, TIMES SIX, USING BILATERAL INTERNAL MAMMARY ARTERIES, RIGHT ENDOSCOPICALLY HARVESTED GREATER SAPHENOUS VEIN, AND RIGHT RADIAL ARTERY OPEN HARVEST;  Surgeon: Wonda Olds, MD;  Location: [place];  Service: Open Heart Surgery;  Laterality: N/A;   CORONARY/GRAFT ACUTE MI REVASCULARIZATION N/A 08/03/2020   Procedure: Coronary/Graft Acute MI Revascularization;  Surgeon: Sherren Mocha, MD;  Location: [place] [facility];  Service: Cardiovascular;  Laterality: N/A;   ENDOVEIN HARVEST OF GREATER SAPHENOUS VEIN Right 08/04/2020   Procedure: ENDOVEIN HARVEST OF GREATER SAPHENOUS VEIN;  Surgeon: Wonda Olds, MD;  Location: [place];  Service: Open Heart Surgery;  Laterality: Right;   INGUINAL HERNIA REPAIR  age [age]   INGUINAL HERNIA REPAIR Bilateral 02/09/2014   Procedure: OPEN BILATERAL INGUINAL HERNIA REPAIR WITH MESH;  Surgeon: [name], MD;  Location: [place];  Service: General;  Laterality: Bilateral;   INSERTION OF MESH Bilateral 02/09/2014   Procedure: INSERTION OF MESH;  Surgeon: [name], MD;  Location: [place];  Service: General;  Laterality: Bilateral;   KNEE ARTHROSCOPY Right    LEFT HEART CATH AND CORONARY ANGIOGRAPHY N/A 08/03/2020   Procedure: LEFT HEART CATH AND CORONARY ANGIOGRAPHY;  Surgeon: Sherren Mocha, MD;  Location: [place] [facility];  Service: Cardiovascular;  Laterality: N/A;   ORCHIECTOMY Left 05/31/2017   Procedure: LEFT RADICAL ORCHIECTOMY;  Surgeon: Irine Seal, MD;  Location: [facility];  Service: Urology;  Laterality: Left;   RADIAL ARTERY HARVEST Right 08/04/2020   Procedure: RADIAL ARTERY HARVEST;  Surgeon: Wonda Olds, MD;  Location: [place];  Service: Open Heart Surgery;  Laterality: Right;   TEE WITHOUT CARDIOVERSION N/A 08/04/2020   Procedure: TRANSESOPHAGEAL ECHOCARDIOGRAM (TEE);  Surgeon: Wonda Olds, MD;  Location: [place];  Service: Open Heart Surgery;  Laterality: N/A;   TONSILLECTOMY AND ADENOIDECTOMY  1960   TOTAL KNEE ARTHROPLASTY Left 01/14/2004    Current Hospital Medications:  Home Meds:  No current facility-administered medications on file prior to encounter.   Current Outpatient Medications on File Prior to Encounter  Medication Sig Dispense Refill   acetaminophen (TYLENOL) 500 MG tablet Take 1,000 mg by mouth every 6 (six) hours as needed for headache or moderate pain.     aspirin 81 MG EC tablet Take 81 mg by mouth daily.     atorvastatin (LIPITOR) 40 MG tablet Take 1 tablet (40 mg total) by mouth daily. (Patient taking differently: Take 40 mg by mouth every other day.) 90 tablet 3   benazepril (LOTENSIN) 20 MG tablet TAKE 1 TABLET BY MOUTH  DAILY (Patient taking differently: Take 20 mg by mouth daily.) 90 tablet 3   Cholecalciferol (VITAMIN D3) 1000 UNITS tablet Take 1,000 Units by mouth daily.     clopidogrel (PLAVIX) 75 MG tablet Take 1 tablet (75 mg total) by mouth daily. 90 tablet 3   cyanocobalamin 1000 MCG tablet Take 1,000 mcg by mouth daily.     furosemide (LASIX) 20 MG tablet TAKE 1 TABLET BY MOUTH AS NEEDED FOR FLUID OR EDEMA (Patient taking differently: Take 20 mg by mouth daily as needed for fluid.) 30 tablet 3   loratadine (CLARITIN) 10 MG tablet Take 10 mg by mouth daily as needed for allergies or rhinitis.     meloxicam (MOBIC) 15 MG tablet TAKE 1 TABLET BY MOUTH  DAILY AS NEEDED FOR PAIN (Patient taking differently: Take 15 mg by mouth daily as needed for pain.) 90 tablet 1   metFORMIN (GLUCOPHAGE) 500 MG tablet  TAKE 1 TABLET BY MOUTH  DAILY WITH BREAKFAST (Patient taking differently: Take 500 mg by mouth daily with breakfast.) 90 tablet 3   metoprolol tartrate (LOPRESSOR) 25 MG tablet Take 1 tablet (25 mg total) by mouth 2 (two) times daily. 180 tablet 3   potassium chloride SA (KLOR-CON) 20 MEQ tablet TAKE 1 TABLET BY MOUTH  DAILY (Patient taking differently: Take 20 mEq by mouth daily.) 90 tablet 3   tamsulosin (FLOMAX) 0.4 MG CAPS capsule Take 1 capsule (0.4 mg total) by mouth daily. 90 capsule 3   tiZANidine (ZANAFLEX) 4 MG tablet TAKE 1 TABLET BY MOUTH  EVERY 8 HOURS AS NEEDED FOR MUSCLE SPASM(S) (Patient taking differently: Take 4 mg by mouth every 8 (eight) hours as needed for muscle spasms.) 270 tablet 0   traMADol (ULTRAM) 50 MG tablet TAKE 1 TO 2 TABLETS BY MOUTH TWICE DAILY AS NEEDED (Patient taking differently: Take 50 mg by mouth 2 (two) times daily. TAKE  1 TO 2 TABLETS BY MOUTH TWICE DAILY AS NEEDED) 100 tablet 3   traZODone (DESYREL) 50 MG tablet TAKE 1/2 TO 1 TABLET BY  MOUTH AT BEDTIME AS NEEDED  FOR SLEEP (Patient taking differently: Take 25-50 mg by mouth at bedtime as needed for sleep.) 90 tablet 3    Scheduled Meds:  aspirin EC  81 mg Oral Daily   atorvastatin  40 mg Oral QODAY   benazepril  20 mg Oral Daily   clopidogrel  75 mg Oral Daily   enoxaparin (LOVENOX) injection  40 mg Subcutaneous Q24H   metFORMIN  500 mg Oral Q breakfast   metoprolol tartrate  25 mg Oral BID   tamsulosin  0.4 mg Oral Daily   cyanocobalamin  1,000 mcg Oral Daily   Continuous Infusions:  lactated ringers 150 mL/hr at 08/13/21 0205   piperacillin-tazobactam (ZOSYN)  IV 3.375 g (08/13/21 0548)   PRN Meds:.acetaminophen **OR** acetaminophen, ondansetron **OR** ondansetron (ZOFRAN) IV, senna-docusate, traZODone  Allergies:  Allergies  Allergen Reactions   Codeine Nausea Only    Family History  Problem Relation Age of Onset   Hypertension Mother    Diabetes Mother    Diabetes Father    Colon  cancer Neg Hx    Esophageal cancer Neg Hx    Rectal cancer Neg Hx    Stomach cancer Neg Hx     Social History:  reports that he quit smoking about a year ago. His smoking use included cigars. He has never used smokeless tobacco. He reports that he does not drink alcohol and does not use drugs.  ROS: A complete review of systems was performed.  All systems are negative except for pertinent findings as noted.  Physical Exam:  Vital signs in last 24 hours: Temp:  [99.1 F (37.3 C)-103.2 F (39.6 C)] 99.1 F (37.3 C) (11/05 0145) Pulse Rate:  [81-116] 84 (11/05 0145) Resp:  [13-20] 18 (11/05 0145) BP: (93-135)/(61-89) 119/66 (11/05 0145) SpO2:  [96 %-100 %] 100 % (11/05 0145) Weight:  [104.3 kg] 104.3 kg (11/04 2229) Constitutional:  Alert and oriented, No acute distress Cardiovascular: Regular rate and rhythm, No JVD. Large midline chest scar, well healed.  Respiratory: Normal respiratory effort, Lungs clear bilaterally GI: Abdomen is soft, nontender, nondistended, no abdominal masses GU: No CVA tenderness, voiding spontaneously, urine at bedside with feculent output  Lymphatic: No lymphadenopathy Neurologic: Grossly intact, no focal deficits Psychiatric: Normal mood and affect  Laboratory Data:  Recent Labs    08/12/21 1330  WBC 10.1  HGB 13.3  HCT 39.6  PLT 195    Recent Labs    08/12/21 1330  NA 130*  K 3.9  CL 100  GLUCOSE 105*  BUN 20  CALCIUM 8.4*  CREATININE 1.42*     Results for orders placed or performed during the hospital encounter of 08/12/21 (from the past 24 hour(s))  CBC with Differential     Status: Abnormal   Collection Time: 08/12/21  1:30 PM  Result Value Ref Range   WBC 10.1 4.0 - 10.5 K/uL   RBC 4.64 4.22 - 5.81 MIL/uL   Hemoglobin 13.3 13.0 - 17.0 g/dL   HCT 39.6 39.0 - 52.0 %   MCV 85.3 80.0 - 100.0 fL   MCH 28.7 26.0 - 34.0 pg   MCHC 33.6 30.0 - 36.0 g/dL   RDW 13.7 11.5 - 15.5 %   Platelets 195 150 - 400 K/uL   nRBC 0.0 0.0 -  0.2 %  Neutrophils Relative % 71 %   Neutro Abs 7.2 1.7 - 7.7 K/uL   Lymphocytes Relative 9 %   Lymphs Abs 0.9 0.7 - 4.0 K/uL   Monocytes Relative 19 %   Monocytes Absolute 2.0 (H) 0.1 - 1.0 K/uL   Eosinophils Relative 0 %   Eosinophils Absolute 0.0 0.0 - 0.5 K/uL   Basophils Relative 0 %   Basophils Absolute 0.0 0.0 - 0.1 K/uL   Immature Granulocytes 1 %   Abs Immature Granulocytes 0.07 0.00 - 0.07 K/uL  Comprehensive metabolic panel     Status: Abnormal   Collection Time: 08/12/21  1:30 PM  Result Value Ref Range   Sodium 130 (L) 135 - 145 mmol/L   Potassium 3.9 3.5 - 5.1 mmol/L   Chloride 100 98 - 111 mmol/L   CO2 20 (L) 22 - 32 mmol/L   Glucose, Bld 105 (H) 70 - 99 mg/dL   BUN 20 8 - 23 mg/dL   Creatinine, Ser 1.42 (H) 0.61 - 1.24 mg/dL   Calcium 8.4 (L) 8.9 - 10.3 mg/dL   Total Protein 6.9 6.5 - 8.1 g/dL   Albumin 2.9 (L) 3.5 - 5.0 g/dL   AST 31 15 - 41 U/L   ALT 20 0 - 44 U/L   Alkaline Phosphatase 70 38 - 126 U/L   Total Bilirubin 2.0 (H) 0.3 - 1.2 mg/dL   GFR, Estimated 53 (L) >60 mL/min   Anion gap 10 5 - 15  Urinalysis, Routine w reflex microscopic Nasopharyngeal Swab     Status: Abnormal   Collection Time: 08/12/21  1:30 PM  Result Value Ref Range   Color, Urine AMBER (A) YELLOW   APPearance CLOUDY (A) CLEAR   Specific Gravity, Urine 1.015 1.005 - 1.030   pH 6.0 5.0 - 8.0   Glucose, UA NEGATIVE NEGATIVE mg/dL   Hgb urine dipstick MODERATE (A) NEGATIVE   Bilirubin Urine NEGATIVE NEGATIVE   Ketones, ur NEGATIVE NEGATIVE mg/dL   Protein, ur 100 (A) NEGATIVE mg/dL   Nitrite NEGATIVE NEGATIVE   Leukocytes,Ua LARGE (A) NEGATIVE   RBC / HPF 11-20 0 - 5 RBC/hpf   WBC, UA >50 (H) 0 - 5 WBC/hpf   Bacteria, UA MANY (A) NONE SEEN   WBC Clumps PRESENT    Mucus PRESENT    Amorphous Crystal PRESENT    Uric Acid Crys, UA PRESENT    Non Squamous Epithelial 0-5 (A) NONE SEEN  Resp Panel by RT-PCR (Flu A&B, Covid) Nasopharyngeal Swab     Status: None   Collection Time:  08/12/21  1:31 PM   Specimen: Nasopharyngeal Swab; Nasopharyngeal(NP) swabs in vial transport medium  Result Value Ref Range   SARS Coronavirus 2 by RT PCR NEGATIVE NEGATIVE   Influenza A by PCR NEGATIVE NEGATIVE   Influenza B by PCR NEGATIVE NEGATIVE  Lactic acid, plasma     Status: None   Collection Time: 08/13/21 12:40 AM  Result Value Ref Range   Lactic Acid, Venous 1.6 0.5 - 1.9 mmol/L   Recent Results (from the past 240 hour(s))  Resp Panel by RT-PCR (Flu A&B, Covid) Nasopharyngeal Swab     Status: None   Collection Time: 08/12/21  1:31 PM   Specimen: Nasopharyngeal Swab; Nasopharyngeal(NP) swabs in vial transport medium  Result Value Ref Range Status   SARS Coronavirus 2 by RT PCR NEGATIVE NEGATIVE Final    Comment: (NOTE) SARS-CoV-2 target nucleic acids are NOT DETECTED.  The SARS-CoV-2 RNA is generally detectable in upper  respiratory specimens during the acute phase of infection. The lowest concentration of SARS-CoV-2 viral copies this assay can detect is 138 copies/mL. A negative result does not preclude SARS-Cov-2 infection and should not be used as the sole basis for treatment or other patient management decisions. A negative result may occur with  improper specimen collection/handling, submission of specimen other than nasopharyngeal swab, presence of viral mutation(s) within the areas targeted by this assay, and inadequate number of viral copies(<138 copies/mL). A negative result must be combined with clinical observations, patient history, and epidemiological information. The expected result is Negative.  Fact Sheet for Patients:  EntrepreneurPulse.com.au  Fact Sheet for Healthcare Providers:  IncredibleEmployment.be  This test is no t yet approved or cleared by the [place] FDA and  has been authorized for detection and/or diagnosis of SARS-CoV-2 by FDA under an Emergency Use Authorization (EUA). This EUA will remain   in effect (meaning this test can be used) for the duration of the COVID-19 declaration under Section 564(b)(1) of the Act, 21 U.S.C.section 360bbb-3(b)(1), unless the authorization is terminated  or revoked sooner.       Influenza A by PCR NEGATIVE NEGATIVE Final   Influenza B by PCR NEGATIVE NEGATIVE Final    Comment: (NOTE) The Xpert Xpress SARS-CoV-2/FLU/RSV plus assay is intended as an aid in the diagnosis of influenza from Nasopharyngeal swab specimens and should not be used as a sole basis for treatment. Nasal washings and aspirates are unacceptable for Xpert Xpress SARS-CoV-2/FLU/RSV testing.  Fact Sheet for Patients: EntrepreneurPulse.com.au  Fact Sheet for Healthcare Providers: IncredibleEmployment.be  This test is not yet approved or cleared by the [place] FDA and has been authorized for detection and/or diagnosis of SARS-CoV-2 by FDA under an Emergency Use Authorization (EUA). This EUA will remain in effect (meaning this test can be used) for the duration of the COVID-19 declaration under Section 564(b)(1) of the Act, 21 U.S.C. section 360bbb-3(b)(1), unless the authorization is terminated or revoked.  Performed at [facility], [place] [address] [place], [place] [address]     Renal Function: Recent Labs    08/12/21 1330  CREATININE 1.42*   Estimated Creatinine Clearance: 64.2 mL/min (A) (by C-G formula based on SCr of 1.42 mg/dL (H)).  Radiologic Imaging: DG Chest 2 View  Result Date: 08/12/2021 CLINICAL DATA:  Cough and headache.  Diarrhea.  Fever and weakness. EXAM: CHEST - 2 VIEW COMPARISON:  08/16/2020 FINDINGS: Previous median sternotomy and CABG procedure. Heart size is normal. No pleural effusion or edema. No airspace densities. Spondylosis identified within the thoracic spine with large flowing ventral osteophytes. IMPRESSION: No active cardiopulmonary abnormalities. Electronically Signed   By: Kerby Moors M.D.   On: 08/12/2021 14:55   CT Renal Stone Study  Result Date: 08/12/2021 CLINICAL DATA:  Hematuria. EXAM: CT ABDOMEN AND PELVIS WITHOUT CONTRAST TECHNIQUE: Multidetector CT imaging of the abdomen and pelvis was performed following the standard protocol without IV contrast. COMPARISON:  CT abdomen pelvis report dated 05/02/2000. The images are not available for direct comparison. FINDINGS: Evaluation of this exam is limited in the absence of intravenous contrast. Lower chest: The visualized lung bases are clear. There is coronary vascular calcification. No intra-abdominal free air or free fluid. Hepatobiliary: The liver is unremarkable. No intrahepatic biliary dilatation. Small gallstones. No pericholecystic fluid or evidence of acute cholecystitis by CT. Pancreas: Unremarkable. No pancreatic ductal dilatation or surrounding inflammatory changes. Spleen: Normal in size without focal abnormality. Adrenals/Urinary Tract: The adrenal glands are unremarkable.  There is a 9 mm nonobstructing left renal inferior pole calculus. No hydronephrosis. There is no hydronephrosis or nephrolithiasis on the right. Faint area of hypodensity in the right renal parenchyma is not evaluated but may represent a cyst or focal area of edema or developing abscess. There is right perinephric stranding. Correlation with urinalysis recommended to evaluate for possibility of pyelonephritis. The visualized ureters appear unremarkable. There is diffuse haziness of the bladder wall concerning for cystitis. There is loss of fat plane between the sigmoid colon and bladder dome with air within the urinary bladder most consistent with a colovesical fistula. Stomach/Bowel: There is sigmoid diverticulosis without definite active inflammation. There is no bowel obstruction. The appendix is normal. Vascular/Lymphatic: Moderate aortoiliac atherosclerotic disease. The IVC is unremarkable. No portal venous gas. There is no adenopathy.  Reproductive: The prostate and seminal vesicles are grossly unremarkable. No pelvic mass. Other: None Musculoskeletal: Degenerative changes of the spine. No acute osseous pathology. IMPRESSION: 1. Sigmoid diverticulosis with colovesical fistula and findings of UTI and right-sided pyelonephritis. Developing abscess in the right kidney is not excluded. 2. A 9 mm nonobstructing left renal inferior pole calculus. No hydronephrosis. 3. Cholelithiasis. 4. No bowel obstruction. Normal appendix. 5. Aortic Atherosclerosis (ICD10-I70.0). Electronically Signed   By: Anner Crete M.D.   On: 08/12/2021 21:55    I independently reviewed the above imaging studies.  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 His history of irritative voiding symptoms, pneumaturia, and debris in urine are clinically suggestive of colovesical fistula seen on CT.   -Agree with treatment for complicated UTI -Follow up urine culture -After treatment course of antibiotics, patient will likely need suppressive antibiotics until fistula is addressed -Please obtain PVR after each void to ensure patient is emptying his bladder. If patient retaining, may need foley catheter for decompression while he is actively infected -Recommend consultation to colorectal surgery for colovesical fistula. May do outpatient workup.   -Plan for outpatient cystoscopy with urology to better characterize fistula.    Aldine Contes 08/13/2021, 5:51 AM

## 2021-08-13 NOTE — Progress Notes (Signed)
Yellow MEWS was implemented for his Temp of 101.8. Tylenol was given. Charge nurse and MD was notified.   08/13/21 1509  Assess: MEWS Score  Temp (!) 101.8 F (38.8 C)  Assess: MEWS Score  MEWS Temp 2  MEWS Systolic 0  MEWS Pulse 0  MEWS RR 0  MEWS LOC 0  MEWS Score 2  MEWS Score Color Yellow  Assess: if the MEWS score is Yellow or Red  Were vital signs taken at a resting state? Yes  Focused Assessment No change from prior assessment  Early Detection of Sepsis Score *See Row Information* Low  MEWS guidelines implemented *See Row Information* Yes  Notify: Charge Nurse/RN  Name of Charge Nurse/RN Notified [place] RN  Date Charge Nurse/RN Notified 08/13/21  Time Charge Nurse/RN Notified 1520  Notify: Provider  Provider Name/Title [place] Crane  Date Provider Notified 08/13/21  Time Provider Notified 1520  Notification Type Page  Notification Reason Other (Comment) (fever)  Provider response See new orders;Other (Comment) (Tylenol and New pain med was ordered)  Date of Provider Response 08/13/21  Time of Provider Response 1521

## 2021-08-13 NOTE — Progress Notes (Signed)
Brian Mccullough is a 70 y.o. male with medical history significant for CAD, HTN, HLD, DMT2, BPH who presents for evaluation of fever, dysuria, urinary hesitancy for past 3-4 days.  He has a history of BPH and has been on Flomax but states he felt it was making him dizzy so he stopped it last week.  He reports he has not felt like he has been able to empty his bladder for the last 5 to 6 days and he developed suprapubic pressure and discomfort along with dysuria 3 to 4 days ago.  He then began to experience pain on the right and developed a fever 3 days ago.  He reports that 2 days ago he had an episode of diarrhea but none since then.  He states he has not had any nausea vomiting.  He has not had any known sick contacts.  He has used Tylenol for the fever at home.  Symptoms have gradually worsened over the last 24 hours so he decided come for evaluation.  He has not had any chest pain, pressure, palpitations. Reports that he had a colonoscopy a few years ago and he has had polyps in the past but has never been told he has diverticulosis. Denies tobacco, alcohol, illicit drug use.  08/13/2021: Patient was seen and examined at his bedside.  He denies having any pain at the time of this exam.  Reports dark urine mixed with debris, which is likely due to his colovesical fistula.  General surgery has been consulted to assist with the management.  Patient was seen by urology this AM, plan for outpatient cystoscopy with urology to better characterize his fistula.  Please refer to H&P dictated by my partner Dr. Tonie Griffith on 08/13/2021 for further details of the assessment and plan.

## 2021-08-13 NOTE — Consult Note (Signed)
Reason for Consult:colovesicle fistula Referring Physician: [name] is an 70 y.o. male.  HPI: 70yo M with PMHx of CAD S/P CABG, colon polyps ( GI), HTN, & DM2. Presented to the ED C/O dysuria and orange urine. Brian Mccullough was admitted and treated for UTI and R pyelonephritis. CT renal stone study yesterday showed sigmoid diverticulosis and colovesicle fistula. We are consulted for surgical management. Brian Mccullough denies abdominal pain. Brian Mccullough has had some bladder spasms and, since admission, began passing air and some brown material from his urethra.  Past Medical History:  Diagnosis Date   Allergy    CAD (coronary artery disease)    s/p Inf STEMI 10/21>>POBA to RCA >> s/p CABG (L-LAD, S-PDA, L Radial-OM1/RI, RIMA-RPLA) // post op AF; SVT >> Amiod Rx // Echo 10/21: EF 55-60, no RWMA, GLS -18.5, normal RVSF, mild LAE, trivial MR    Carotid artery disease ([place])    [place] 10/21: R 1-39; L 40-59   Hyperactive gag reflex    Hypertension    under control with meds., has been on med. x 30 yr.   Inguinal hernia 01/2014   bilateral   LBP (low back pain)    Non-insulin dependent type 2 diabetes mellitus (HCC)    OA (osteoarthritis) of knee 01/2004   left   Palpitations    s/p Inf STEMI 07/2020   POBA to RCA >> CABG   Vitamin B 12 deficiency     Past Surgical History:  Procedure Laterality Date   CARDIAC CATHETERIZATION     CORONARY ARTERY BYPASS GRAFT N/A 08/04/2020   Procedure: CORONARY ARTERY BYPASS GRAFTING (CABG), ON PUMP, TIMES SIX, USING BILATERAL INTERNAL MAMMARY ARTERIES, RIGHT ENDOSCOPICALLY HARVESTED GREATER SAPHENOUS VEIN, AND RIGHT RADIAL ARTERY OPEN HARVEST;  Surgeon: Wonda Olds, MD;  Location: [facility];  Service: Open Heart Surgery;  Laterality: N/A;   CORONARY/GRAFT ACUTE MI REVASCULARIZATION N/A 08/03/2020   Procedure: Coronary/Graft Acute MI Revascularization;  Surgeon: Sherren Mocha, MD;  Location: [facility];  Service: Cardiovascular;  Laterality: N/A;    ENDOVEIN HARVEST OF GREATER SAPHENOUS VEIN Right 08/04/2020   Procedure: ENDOVEIN HARVEST OF GREATER SAPHENOUS VEIN;  Surgeon: Wonda Olds, MD;  Location: [facility];  Service: Open Heart Surgery;  Laterality: Right;   INGUINAL HERNIA REPAIR  age [age]   INGUINAL HERNIA REPAIR Bilateral 02/09/2014   Procedure: OPEN BILATERAL INGUINAL HERNIA REPAIR WITH MESH;  Surgeon: [name], MD;  Location: [facility];  Service: General;  Laterality: Bilateral;   INSERTION OF MESH Bilateral 02/09/2014   Procedure: INSERTION OF MESH;  Surgeon: [name], MD;  Location: [facility];  Service: General;  Laterality: Bilateral;   KNEE ARTHROSCOPY Right    LEFT HEART CATH AND CORONARY ANGIOGRAPHY N/A 08/03/2020   Procedure: LEFT HEART CATH AND CORONARY ANGIOGRAPHY;  Surgeon: Sherren Mocha, MD;  Location: [facility];  Service: Cardiovascular;  Laterality: N/A;   ORCHIECTOMY Left 05/31/2017   Procedure: LEFT RADICAL ORCHIECTOMY;  Surgeon: Irine Seal, MD;  Location[facility];  Service: Urology;  Laterality: Left;   RADIAL ARTERY HARVEST Right 08/04/2020   Procedure: RADIAL ARTERY HARVEST;  Surgeon: Wonda Olds, MD;  Location: [facility];  Service: Open Heart Surgery;  Laterality: Right;   TEE WITHOUT CARDIOVERSION N/A 08/04/2020   Procedure: TRANSESOPHAGEAL ECHOCARDIOGRAM (TEE);  Surgeon: Wonda Olds, MD;  Location: [facility];  Service: Open Heart Surgery;  Laterality: N/A;   TONSILLECTOMY AND ADENOIDECTOMY  1960   TOTAL KNEE ARTHROPLASTY Left 01/14/2004    Family History  Problem Relation Age of Onset   Hypertension Mother    Diabetes Mother    Diabetes Father    Colon cancer Neg Hx    Esophageal cancer Neg Hx    Rectal cancer Neg Hx    Stomach cancer Neg Hx     Social History:  reports that Brian Mccullough quit smoking about a year ago. His smoking use included cigars. Brian Mccullough has never used smokeless tobacco. Brian Mccullough reports that Brian Mccullough does not drink alcohol and  does not use drugs.  Allergies:  Allergies  Allergen Reactions   Codeine Nausea Only    Medications: Scheduled:  aspirin EC  81 mg Oral Daily   atorvastatin  40 mg Oral QODAY   benazepril  20 mg Oral Daily   clopidogrel  75 mg Oral Daily   enoxaparin (LOVENOX) injection  40 mg Subcutaneous Q24H   [START ON 08/14/2021] influenza vaccine adjuvanted  0.5 mL Intramuscular Tomorrow-1000   metFORMIN  500 mg Oral Q breakfast   metoprolol tartrate  25 mg Oral BID   tamsulosin  0.4 mg Oral Daily   cyanocobalamin  1,000 mcg Oral Daily    Results for orders placed or performed during the hospital encounter of 08/12/21 (from the past 48 hour(s))  CBC with Differential     Status: Abnormal   Collection Time: 08/12/21  1:30 PM  Result Value Ref Range   WBC 10.1 4.0 - 10.5 K/uL   RBC 4.64 4.22 - 5.81 MIL/uL   Hemoglobin 13.3 13.0 - 17.0 g/dL   HCT 39.6 39.0 - 52.0 %   MCV 85.3 80.0 - 100.0 fL   MCH 28.7 26.0 - 34.0 pg   MCHC 33.6 30.0 - 36.0 g/dL   RDW 13.7 11.5 - 15.5 %   Platelets 195 150 - 400 K/uL   nRBC 0.0 0.0 - 0.2 %   Neutrophils Relative % 71 %   Neutro Abs 7.2 1.7 - 7.7 K/uL   Lymphocytes Relative 9 %   Lymphs Abs 0.9 0.7 - 4.0 K/uL   Monocytes Relative 19 %   Monocytes Absolute 2.0 (H) 0.1 - 1.0 K/uL   Eosinophils Relative 0 %   Eosinophils Absolute 0.0 0.0 - 0.5 K/uL   Basophils Relative 0 %   Basophils Absolute 0.0 0.0 - 0.1 K/uL   Immature Granulocytes 1 %   Abs Immature Granulocytes 0.07 0.00 - 0.07 K/uL    Comment: Performed at [facility], [address] [place], [place] [address]  Comprehensive metabolic panel     Status: Abnormal   Collection Time: 08/12/21  1:30 PM  Result Value Ref Range   Sodium 130 (L) 135 - 145 mmol/L   Potassium 3.9 3.5 - 5.1 mmol/L   Chloride 100 98 - 111 mmol/L   CO2 20 (L) 22 - 32 mmol/L   Glucose, Bld 105 (H) 70 - 99 mg/dL    Comment: Glucose reference range applies only to samples taken after fasting for at least 8 hours.    BUN 20 8 - 23 mg/dL   Creatinine, Ser 1.42 (H) 0.61 - 1.24 mg/dL   Calcium 8.4 (L) 8.9 - 10.3 mg/dL   Total Protein 6.9 6.5 - 8.1 g/dL   Albumin 2.9 (L) 3.5 - 5.0 g/dL   AST 31 15 - 41 U/L   ALT 20 0 - 44 U/L   Alkaline Phosphatase 70 38 - 126 U/L  Total Bilirubin 2.0 (H) 0.3 - 1.2 mg/dL   GFR, Estimated 53 (L) >60 mL/min    Comment: (NOTE) Calculated using the CKD-EPI Creatinine Equation (2021)    Anion gap 10 5 - 15    Comment: Performed at [facility] [address], [place], [place] [address]  Urinalysis, Routine w reflex microscopic Nasopharyngeal Swab     Status: Abnormal   Collection Time: 08/12/21  1:30 PM  Result Value Ref Range   Color, Urine AMBER (A) YELLOW    Comment: BIOCHEMICALS MAY BE AFFECTED BY COLOR   APPearance CLOUDY (A) CLEAR   Specific Gravity, Urine 1.015 1.005 - 1.030   pH 6.0 5.0 - 8.0   Glucose, UA NEGATIVE NEGATIVE mg/dL   Hgb urine dipstick MODERATE (A) NEGATIVE   Bilirubin Urine NEGATIVE NEGATIVE   Ketones, ur NEGATIVE NEGATIVE mg/dL   Protein, ur 100 (A) NEGATIVE mg/dL   Nitrite NEGATIVE NEGATIVE   Leukocytes,Ua LARGE (A) NEGATIVE   RBC / HPF 11-20 0 - 5 RBC/hpf   WBC, UA >50 (H) 0 - 5 WBC/hpf   Bacteria, UA MANY (A) NONE SEEN   WBC Clumps PRESENT    Mucus PRESENT    Amorphous Crystal PRESENT    Uric Acid Crys, UA PRESENT    Non Squamous Epithelial 0-5 (A) NONE SEEN    Comment: Performed at [facility], [place] [address], [place], [place] [address]  Resp Panel by RT-PCR (Flu A&B, Covid) Nasopharyngeal Swab     Status: None   Collection Time: 08/12/21  1:31 PM   Specimen: Nasopharyngeal Swab; Nasopharyngeal(NP) swabs in vial transport medium  Result Value Ref Range   SARS Coronavirus 2 by RT PCR NEGATIVE NEGATIVE    Comment: (NOTE) SARS-CoV-2 target nucleic acids are NOT DETECTED.  The SARS-CoV-2 RNA is generally detectable in upper respiratory specimens during the acute phase of infection. The lowest concentration of  SARS-CoV-2 viral copies this assay can detect is 138 copies/mL. A negative result does not preclude SARS-Cov-2 infection and should not be used as the sole basis for treatment or other patient management decisions. A negative result may occur with  improper specimen collection/handling, submission of specimen other than nasopharyngeal swab, presence of viral mutation(s) within the areas targeted by this assay, and inadequate number of viral copies(<138 copies/mL). A negative result must be combined with clinical observations, patient history, and epidemiological information. The expected result is Negative.  Fact Sheet for Patients:  EntrepreneurPulse.com.au  Fact Sheet for Healthcare Providers:  IncredibleEmployment.be  This test is no t yet approved or cleared by the [place] FDA and  has been authorized for detection and/or diagnosis of SARS-CoV-2 by FDA under an Emergency Use Authorization (EUA). This EUA will remain  in effect (meaning this test can be used) for the duration of the COVID-19 declaration under Section 564(b)(1) of the Act, 21 U.S.C.section 360bbb-3(b)(1), unless the authorization is terminated  or revoked sooner.       Influenza A by PCR NEGATIVE NEGATIVE   Influenza B by PCR NEGATIVE NEGATIVE    Comment: (NOTE) The Xpert Xpress SARS-CoV-2/FLU/RSV plus assay is intended as an aid in the diagnosis of influenza from Nasopharyngeal swab specimens and should not be used as a sole basis for treatment. Nasal washings and aspirates are unacceptable for Xpert Xpress SARS-CoV-2/FLU/RSV testing.  Fact Sheet for Patients: EntrepreneurPulse.com.au  Fact Sheet for Healthcare Providers: IncredibleEmployment.be  This test is not yet approved or cleared by the [place] and  has been authorized for detection and/or diagnosis of SARS-CoV-2 by FDA under an Emergency Use Authorization (EUA).  This EUA will remain in effect (meaning this test can be used) for the duration of the COVID-19 declaration under Section 564(b)(1) of the Act, 21 U.S.C. section 360bbb-3(b)(1), unless the authorization is terminated or revoked.  Performed at [facility], [place] [address], [place], [place] [address]   Lactic acid, plasma     Status: None   Collection Time: 08/13/21 12:40 AM  Result Value Ref Range   Lactic Acid, Venous 1.6 0.5 - 1.9 mmol/L    Comment: Performed at [facility] [address], [place], [place] [address]  Lactic acid, plasma     Status: None   Collection Time: 08/13/21  5:58 AM  Result Value Ref Range   Lactic Acid, Venous 1.7 0.5 - 1.9 mmol/L    Comment: Performed at [facility] [address], [place], [place] [address]  HIV Antibody (routine testing w rflx)     Status: None   Collection Time: 08/13/21  5:58 AM  Result Value Ref Range   HIV Screen 4th Generation wRfx Non Reactive Non Reactive    Comment: Performed at [facility], [place] [address], [place], [place] [address]  Basic metabolic panel     Status: Abnormal   Collection Time: 08/13/21  5:58 AM  Result Value Ref Range   Sodium 132 (L) 135 - 145 mmol/L   Potassium 3.8 3.5 - 5.1 mmol/L   Chloride 104 98 - 111 mmol/L   CO2 19 (L) 22 - 32 mmol/L   Glucose, Bld 110 (H) 70 - 99 mg/dL    Comment: Glucose reference range applies only to samples taken after fasting for at least 8 hours.   BUN 26 (H) 8 - 23 mg/dL   Creatinine, Ser 1.36 (H) 0.61 - 1.24 mg/dL   Calcium 8.2 (L) 8.9 - 10.3 mg/dL   GFR, Estimated 56 (L) >60 mL/min    Comment: (NOTE) Calculated using the CKD-EPI Creatinine Equation (2021)    Anion gap 9 5 - 15    Comment: Performed at [facility] [address], [place], [place] [address]  CBC     Status: Abnormal   Collection Time: 08/13/21  5:58 AM  Result Value Ref Range   WBC 10.1 4.0 - 10.5 K/uL   RBC 4.11 (L) 4.22 - 5.81 MIL/uL   Hemoglobin 11.7 (L) 13.0  - 17.0 g/dL   HCT 34.8 (L) 39.0 - 52.0 %   MCV 84.7 80.0 - 100.0 fL   MCH 28.5 26.0 - 34.0 pg   MCHC 33.6 30.0 - 36.0 g/dL   RDW 13.9 11.5 - 15.5 %   Platelets 166 150 - 400 K/uL   nRBC 0.0 0.0 - 0.2 %    Comment: Performed at [facility], [place] [address] [place], [place] [address]    DG Chest 2 View  Result Date: 08/12/2021 CLINICAL DATA:  Cough and headache.  Diarrhea.  Fever and weakness. EXAM: CHEST - 2 VIEW COMPARISON:  08/16/2020 FINDINGS: Previous median sternotomy and CABG procedure. Heart size is normal. No pleural effusion or edema. No airspace densities. Spondylosis identified within the thoracic spine with large flowing ventral osteophytes. IMPRESSION: No active cardiopulmonary abnormalities. Electronically Signed   By: Kerby Moors M.D.   On: 08/12/2021 14:55   CT Renal Stone Study  Result Date: 08/12/2021 CLINICAL DATA:  Hematuria. EXAM: CT  ABDOMEN AND PELVIS WITHOUT CONTRAST TECHNIQUE: Multidetector CT imaging of the abdomen and pelvis was performed following the standard protocol without IV contrast. COMPARISON:  CT abdomen pelvis report dated 05/02/2000. The images are not available for direct comparison. FINDINGS: Evaluation of this exam is limited in the absence of intravenous contrast. Lower chest: The visualized lung bases are clear. There is coronary vascular calcification. No intra-abdominal free air or free fluid. Hepatobiliary: The liver is unremarkable. No intrahepatic biliary dilatation. Small gallstones. No pericholecystic fluid or evidence of acute cholecystitis by CT. Pancreas: Unremarkable. No pancreatic ductal dilatation or surrounding inflammatory changes. Spleen: Normal in size without focal abnormality. Adrenals/Urinary Tract: The adrenal glands are unremarkable. There is a 9 mm nonobstructing left renal inferior pole calculus. No hydronephrosis. There is no hydronephrosis or nephrolithiasis on the right. Faint area of hypodensity in the right renal  parenchyma is not evaluated but may represent a cyst or focal area of edema or developing abscess. There is right perinephric stranding. Correlation with urinalysis recommended to evaluate for possibility of pyelonephritis. The visualized ureters appear unremarkable. There is diffuse haziness of the bladder wall concerning for cystitis. There is loss of fat plane between the sigmoid colon and bladder dome with air within the urinary bladder most consistent with a colovesical fistula. Stomach/Bowel: There is sigmoid diverticulosis without definite active inflammation. There is no bowel obstruction. The appendix is normal. Vascular/Lymphatic: Moderate aortoiliac atherosclerotic disease. The IVC is unremarkable. No portal venous gas. There is no adenopathy. Reproductive: The prostate and seminal vesicles are grossly unremarkable. No pelvic mass. Other: None Musculoskeletal: Degenerative changes of the spine. No acute osseous pathology. IMPRESSION: 1. Sigmoid diverticulosis with colovesical fistula and findings of UTI and right-sided pyelonephritis. Developing abscess in the right kidney is not excluded. 2. A 9 mm nonobstructing left renal inferior pole calculus. No hydronephrosis. 3. Cholelithiasis. 4. No bowel obstruction. Normal appendix. 5. Aortic Atherosclerosis (ICD10-I70.0). Electronically Signed   By: Anner Crete M.D.   On: 08/12/2021 21:55    Review of Systems  Constitutional:  Positive for fever.  HENT: Negative.    Eyes: Negative.   Respiratory: Negative.    Cardiovascular: Negative.   Gastrointestinal:  Negative for abdominal pain, constipation, nausea and vomiting.  Endocrine: Negative.   Genitourinary:  Positive for dysuria and penile discharge.  Musculoskeletal: Negative.   Allergic/Immunologic: Negative.   Neurological: Negative.   Hematological: Negative.   Psychiatric/Behavioral: Negative.    Blood pressure 124/75, pulse 65, temperature (!) 101.6 F (38.7 C), resp. rate 18, height  6\' 3"  (1.905 m), weight 104.3 kg, SpO2 95 %. Physical Exam Constitutional:      Appearance: Normal appearance.  HENT:     Head: Normocephalic.     Right Ear: External ear normal.     Left Ear: External ear normal.     Nose: Nose normal.     Mouth/Throat:     Mouth: Mucous membranes are moist.  Eyes:     General: No scleral icterus.    Extraocular Movements: Extraocular movements intact.     Pupils: Pupils are equal, round, and reactive to light.  Cardiovascular:     Rate and Rhythm: Normal rate and regular rhythm.     Pulses: Normal pulses.     Heart sounds: Normal heart sounds.  Pulmonary:     Effort: Pulmonary effort is normal.     Breath sounds: Normal breath sounds.  Abdominal:     General: Abdomen is flat. There is no distension.  Palpations: There is no mass.     Tenderness: There is no abdominal tenderness. There is no guarding or rebound.  Musculoskeletal:        General: No swelling. Normal range of motion.     Cervical back: Normal range of motion. No rigidity.  Skin:    General: Skin is warm and dry.     Capillary Refill: Capillary refill takes 2 to 3 seconds.  Neurological:     Mental Status: Brian Mccullough is alert and oriented to person, place, and time.  Psychiatric:        Mood and Affect: Mood normal.    Assessment/Plan: Sigmoid diverticulosis with colovesicle fistula, UTI, and pyelonephritis - agree with IV Zosyn. No acute surgery needed. Once D/C from the hospital Brian Mccullough should F/U with Dr. Neysa Bonito, Dr. Leighton Ruff, or Dr. Annye English at [facility] to plan elective sigmoidectomy once infection improved. Please call us in the interim if other questions arise.  Zenovia Jarred 08/13/2021, 5:14 PM

## 2021-08-13 NOTE — Progress Notes (Signed)
Patient has had two doses of Pfizer Covid Vaccine.  He would like to get a booster while he is here.

## 2021-08-13 NOTE — Plan of Care (Signed)

## 2021-08-13 NOTE — H&P (Signed)
History and Physical    Brian Mccullough MRN:[id number] DOB: 09/02/1951 DOA: 08/12/2021  PCP: [name], Aleksei V, MD   Patient coming from: Home  Chief Complaint: fever, suprapubic tenderness, urinary pain and hesitancy  HPI: Brian Mccullough is a 70 y.o. male with medical history significant for CAD, HTN, HLD, DMT2, BPH who presents for evaluation of fever, dysuria, urinary hesitancy for past 3-4 days.  He has a history of BPH and has been on Flomax but states he felt it was making him dizzy so he stopped it last week.  He reports he has not felt like he has been able to empty his bladder for the last 5 to 6 days and he developed suprapubic pressure and discomfort along with dysuria 3 to 4 days ago.  He then began to experience pain on the right and developed a fever 3 days ago.  He reports that 2 days ago he had an episode of diarrhea but none since then.  He states he has not had any nausea vomiting.  He has not had any known sick contacts.  He has used Tylenol for the fever at home.  Symptoms have gradually worsened over the last 24 hours so he decided come for evaluation.  He has not had any chest pain, pressure, palpitations. Reports that he had a colonoscopy a few years ago and he has had polyps in the past but has never been told he has diverticulosis. Denies tobacco, alcohol, illicit drug use.  ED Course: Esther Dahlstrom has had a T-max of 103.2 degrees in the emergency room.  He has been hemodynamically stable.  Urinalysis is positive for urinary tract infection.  CT of the abdomen without contrast shows perinephric stranding on the right with possible small developing abscess as well as a possible colovesical fistula.  Radiologist notes diverticulosis Me[place]:.  ER physician discussed with urology who will see patient in the morning consultation and determine if cystoscopy or other intervention is required.  Patient is placed on Zosyn for empiric antibiotic coverage to cover pyelonephritis and  possible diverticulitis.  COVID negative.  Influenza a and B are negative.  WBC is 10,100 hemoglobin 13.3 hematocrit 39.6 platelets 195,000.  Sodium 130 potassium 3.9 chloride 100 bicarb 20 creatinine 1.42 BUN 20 (baseline creatinine 1-1.15) calcium 8.4 alk phosphatase 70 AST 31 ALT 20 bilirubin 2.0 albumin 2.9.  Hospitalist service asked to admit patient for further management  Review of Systems:  General: Reports fever, chills. Denies weight loss, night sweats.  Denies dizziness.  Denies change in appetite HENT: Denies head trauma, headache, denies change in hearing, tinnitus. Denies nasal congestion or bleeding. Denies sore throat.  Denies difficulty swallowing Eyes: Denies blurry vision, pain in eye, drainage.  Denies discoloration of eyes. Neck: Denies pain.  Denies swelling.  Denies pain with movement. Cardiovascular: Denies chest pain, palpitations.  Denies edema.  Denies orthopnea Respiratory: Denies shortness of breath, cough.  Denies wheezing.  Denies sputum production Gastrointestinal: Denies abdominal swelling.  Denies nausea, vomiting.  Denies melena.  Denies hematemesis. Musculoskeletal: Denies limitation of movement.  Denies deformity or swelling. Denies arthralgias or myalgias. Genitourinary: Reports dysuria, pelvic pain, urinary hesitancy Skin: Denies rash.  Denies petechiae, purpura, ecchymosis. Neurological: Denies syncope.  Denies seizure activity.  Denies paresthesia.  Denies slurred speech, drooping face.  Denies visual change. Psychiatric: Denies depression, anxiety.  Denies hallucinations.  Past Medical History:  Diagnosis Date   Allergy    CAD (coronary artery disease)    s/p Inf STEMI 10/21>>POBA   to RCA >> s/p CABG (L-LAD, S-PDA, L Radial-OM1/RI, RIMA-RPLA) // post op AF; SVT >> Amiod Rx // Echo 10/21: EF 55-60, no RWMA, GLS -18.5, normal RVSF, mild LAE, trivial MR    Carotid artery disease (HCC)    US 10/21: R 1-39; L 40-59   Hyperactive gag reflex    Hypertension     under control with meds., has been on med. x 30 yr.   Inguinal hernia 01/2014   bilateral   LBP (low back pain)    Non-insulin dependent type 2 diabetes mellitus (HCC)    OA (osteoarthritis) of knee 01/2004   left   Palpitations    s/p Inf STEMI 07/2020   POBA to RCA >> CABG   Vitamin B 12 deficiency     Past Surgical History:  Procedure Laterality Date   CARDIAC CATHETERIZATION     CORONARY ARTERY BYPASS GRAFT N/A 08/04/2020   Procedure: CORONARY ARTERY BYPASS GRAFTING (CABG), ON PUMP, TIMES SIX, USING BILATERAL INTERNAL MAMMARY ARTERIES, RIGHT ENDOSCOPICALLY HARVESTED GREATER SAPHENOUS VEIN, AND RIGHT RADIAL ARTERY OPEN HARVEST;  Surgeon: Atkins, Broadus Z, MD;  Location: [facility] OR;  Service: Open Heart Surgery;  Laterality: N/A;   CORONARY/GRAFT ACUTE MI REVASCULARIZATION N/A 08/03/2020   Procedure: Coronary/Graft Acute MI Revascularization;  Surgeon: Cooper, Michael, MD;  Location: [facility];  Service: Cardiovascular;  Laterality: N/A;   ENDOVEIN HARVEST OF GREATER SAPHENOUS VEIN Right 08/04/2020   Procedure: ENDOVEIN HARVEST OF GREATER SAPHENOUS VEIN;  Surgeon: Atkins, Broadus Z, MD;  Location: [facility] OR;  Service: Open Heart Surgery;  Laterality: Right;   INGUINAL HERNIA REPAIR  age 3-5   INGUINAL HERNIA REPAIR Bilateral 02/09/2014   Procedure: OPEN BILATERAL INGUINAL HERNIA REPAIR WITH MESH;  Surgeon: Haywood M Ingram, MD;  Location: [facility];  Service: General;  Laterality: Bilateral;   INSERTION OF MESH Bilateral 02/09/2014   Procedure: INSERTION OF MESH;  Surgeon: Haywood M Ingram, MD;  Location: [facility];  Service: General;  Laterality: Bilateral;   KNEE ARTHROSCOPY Right    LEFT HEART CATH AND CORONARY ANGIOGRAPHY N/A 08/03/2020   Procedure: LEFT HEART CATH AND CORONARY ANGIOGRAPHY;  Surgeon: Cooper, Michael, MD;  Location: [facility];  Service: Cardiovascular;  Laterality: N/A;   ORCHIECTOMY Left 05/31/2017   Procedure: LEFT RADICAL  ORCHIECTOMY;  Surgeon: Wrenn, John, MD;  Location: [facility];  Service: Urology;  Laterality: Left;   RADIAL ARTERY HARVEST Right 08/04/2020   Procedure: RADIAL ARTERY HARVEST;  Surgeon: Atkins, Broadus Z, MD;  Location: [facility];  Service: Open Heart Surgery;  Laterality: Right;   TEE WITHOUT CARDIOVERSION N/A 08/04/2020   Procedure: TRANSESOPHAGEAL ECHOCARDIOGRAM (TEE);  Surgeon: Atkins, Broadus Z, MD;  Location: [facility];  Service: Open Heart Surgery;  Laterality: N/A;   TONSILLECTOMY AND ADENOIDECTOMY  1960   TOTAL KNEE ARTHROPLASTY Left 01/14/2004    Social History  reports that he quit smoking about a year ago. His smoking use included cigars. He has never used smokeless tobacco. He reports that he does not drink alcohol and does not use drugs.  Allergies  Allergen Reactions   Codeine Nausea Only    Family History  Problem Relation Age of Onset   Hypertension Mother    Diabetes Mother    Diabetes Father    Colon cancer Neg Hx    Esophageal cancer Neg Hx    Rectal cancer Neg Hx    Stomach cancer Neg Hx        Prior to Admission medications   Medication Sig Start Date End Date Taking? Authorizing Provider  acetaminophen (TYLENOL) 500 MG tablet Take 1,000 mg by mouth every 6 (six) hours as needed for headache or moderate pain.   Yes [provider]  aspirin 81 MG EC tablet Take 81 mg by mouth daily.   Yes [provider]  atorvastatin (LIPITOR) 40 MG tablet Take 1 tablet (40 mg total) by mouth daily. Patient taking differently: Take 40 mg by mouth every other day. 03/16/21  Yes Weaver, Scott T, PA-C  benazepril (LOTENSIN) 20 MG tablet TAKE 1 TABLET BY MOUTH  DAILY Patient taking differently: Take 20 mg by mouth daily. 06/03/21  Yes [name], Aleksei V, MD  Cholecalciferol (VITAMIN D3) 1000 UNITS tablet Take 1,000 Units by mouth daily.   Yes [provider]  clopidogrel (PLAVIX) 75 MG tablet Take 1 tablet (75 mg total) by mouth daily. 11/29/20   Yes Cooper, Michael, MD  cyanocobalamin 1000 MCG tablet Take 1,000 mcg by mouth daily.   Yes [provider]  furosemide (LASIX) 20 MG tablet TAKE 1 TABLET BY MOUTH AS NEEDED FOR FLUID OR EDEMA Patient taking differently: Take 20 mg by mouth daily as needed for fluid. 02/23/21  Yes Weaver, Scott T, PA-C  loratadine (CLARITIN) 10 MG tablet Take 10 mg by mouth daily as needed for allergies or rhinitis.   Yes [provider]  meloxicam (MOBIC) 15 MG tablet TAKE 1 TABLET BY MOUTH  DAILY AS NEEDED FOR PAIN Patient taking differently: Take 15 mg by mouth daily as needed for pain. 05/05/21  Yes [name], Aleksei V, MD  metFORMIN (GLUCOPHAGE) 500 MG tablet TAKE 1 TABLET BY MOUTH  DAILY WITH BREAKFAST Patient taking differently: Take 500 mg by mouth daily with breakfast. 06/07/21  Yes [name], Aleksei V, MD  metoprolol tartrate (LOPRESSOR) 25 MG tablet Take 1 tablet (25 mg total) by mouth 2 (two) times daily. 07/01/21 09/29/21 Yes Weaver, Scott T, PA-C  potassium chloride SA (KLOR-CON) 20 MEQ tablet TAKE 1 TABLET BY MOUTH  DAILY Patient taking differently: Take 20 mEq by mouth daily. 06/07/21  Yes [name], Aleksei V, MD  tamsulosin (FLOMAX) 0.4 MG CAPS capsule Take 1 capsule (0.4 mg total) by mouth daily. 07/20/21  Yes John, James W, MD  tiZANidine (ZANAFLEX) 4 MG tablet TAKE 1 TABLET BY MOUTH  EVERY 8 HOURS AS NEEDED FOR MUSCLE SPASM(S) Patient taking differently: Take 4 mg by mouth every 8 (eight) hours as needed for muscle spasms. 06/07/21  Yes [name], Aleksei V, MD  traMADol (ULTRAM) 50 MG tablet TAKE 1 TO 2 TABLETS BY MOUTH TWICE DAILY AS NEEDED Patient taking differently: Take 50 mg by mouth 2 (two) times daily. TAKE 1 TO 2 TABLETS BY MOUTH TWICE DAILY AS NEEDED 12/06/20  Yes [name], Aleksei V, MD  traZODone (DESYREL) 50 MG tablet TAKE 1/2 TO 1 TABLET BY  MOUTH AT BEDTIME AS NEEDED  FOR SLEEP Patient taking differently: Take 25-50 mg by mouth at bedtime as needed for sleep.  06/03/21  Yes [name], Aleksei V, MD    Physical Exam: Vitals:   08/12/21 2115 08/12/21 2229 08/12/21 2231 08/12/21 2335  BP: 132/67  106/89 124/82  Pulse: 81  87 87  Resp: 20  19 13  Temp:   (!) 100.9 F (38.3 C)   TempSrc:   Oral   SpO2: 100%  100% 100%  Weight:  104.3 kg    Height:  6' 3" (1.905 m)        Constitutional: NAD, calm, comfortable Vitals:   08/12/21 2115 08/12/21 2229 08/12/21 2231 08/12/21 2335  BP: 132/67  106/89 124/82  Pulse: 81  87 87  Resp: _0 Temp:   (!) 100.9 F (38.3 C)   TempSrc:   Oral   SpO2: 100%  100% 100%  Weight:  104.3 kg    Height:  6' 3" (1.905 m)     General: WDWN, Alert and oriented x3.  Eyes: EOMI, PERRL, conjunctivae normal.  Sclera nonicteric HENT:  [place]/AT, external ears normal.  Nares patent without epistasis. Mucous membranes are moist. Posterior pharynx clear Neck: Soft, normal range of motion, supple, no masses, Trachea midline Respiratory: clear to auscultation bilaterally, no wheezing, no crackles. Normal respiratory effort. No accessory muscle use.  Cardiovascular: Regular rate and rhythm, no murmurs / rubs / gallops. No extremity edema. 2+ pedal pulses.  Abdomen: Soft, Suprapubic tenderness, nondistended, no rebound or guarding.  No masses palpated.  Bowel sounds normoactive. Mild right CVA tenderness to percussion Musculoskeletal: FROM. Healed surgical incision midline of sternum. no cyanosis. No joint deformity upper and lower extremities. Normal muscle tone.  Skin: Warm, dry, intact no rashes, lesions, ulcers. No induration.  Normal skin turgor Neurologic: CN 2-12 grossly intact.  Normal speech. Sensation intact to touch. Strength 5/5 in all extremities.   Psychiatric: Normal judgment and insight.  Normal mood.    Labs on Admission: I have personally reviewed following labs and imaging studies  CBC: Recent Labs  Lab 08/12/21 1330  WBC 10.1  NEUTROABS 7.2  HGB 13.3  HCT 39.6  MCV 85.3  PLT 195    Basic  Metabolic Panel: Recent Labs  Lab 08/12/21 1330  NA 130*  K 3.9  CL 100  CO2 20*  GLUCOSE 105*  BUN 20  CREATININE 1.42*  CALCIUM 8.4*    GFR: Estimated Creatinine Clearance: 64.2 mL/min (A) (by C-G formula based on SCr of 1.42 mg/dL (H)).  Liver Function Tests: Recent Labs  Lab 08/12/21 1330  AST 31  ALT 20  ALKPHOS 70  BILITOT 2.0*  PROT 6.9  ALBUMIN 2.9*    Urine analysis:    Component Value Date/Time   COLORURINE AMBER (A) 08/12/2021 1330   APPEARANCEUR CLOUDY (A) 08/12/2021 1330   LABSPEC 1.015 08/12/2021 1330   PHURINE 6.0 08/12/2021 1330   GLUCOSEU NEGATIVE 08/12/2021 1330   GLUCOSEU NEGATIVE 07/14/2021 1548   HGBUR MODERATE (A) 08/12/2021 1330   BILIRUBINUR NEGATIVE 08/12/2021 1330   BILIRUBINUR negative 07/09/2014 1019   KETONESUR NEGATIVE 08/12/2021 1330   PROTEINUR 100 (A) 08/12/2021 1330   UROBILINOGEN 2.0 (A) 07/14/2021 1548   NITRITE NEGATIVE 08/12/2021 1330   LEUKOCYTESUR LARGE (A) 08/12/2021 1330    Radiological Exams on Admission: DG Chest 2 View  Result Date: 08/12/2021 CLINICAL DATA:  Cough and headache.  Diarrhea.  Fever and weakness. EXAM: CHEST - 2 VIEW COMPARISON:  08/16/2020 FINDINGS: Previous median sternotomy and CABG procedure. Heart size is normal. No pleural effusion or edema. No airspace densities. Spondylosis identified within the thoracic spine with large flowing ventral osteophytes. IMPRESSION: No active cardiopulmonary abnormalities. Electronically Signed   By: Kerby Moors M.D.   On: 08/12/2021 14:55   CT Renal Stone Study  Result Date: 08/12/2021 CLINICAL DATA:  Hematuria. EXAM: CT ABDOMEN AND PELVIS WITHOUT CONTRAST TECHNIQUE: Multidetector CT imaging of the abdomen and pelvis was performed following the standard protocol without IV contrast. COMPARISON:  CT abdomen pelvis report dated 05/02/2000. The images are not available  for direct comparison. FINDINGS: Evaluation of this exam is limited in the absence of intravenous  contrast. Lower chest: The visualized lung bases are clear. There is coronary vascular calcification. No intra-abdominal free air or free fluid. Hepatobiliary: The liver is unremarkable. No intrahepatic biliary dilatation. Small gallstones. No pericholecystic fluid or evidence of acute cholecystitis by CT. Pancreas: Unremarkable. No pancreatic ductal dilatation or surrounding inflammatory changes. Spleen: Normal in size without focal abnormality. Adrenals/Urinary Tract: The adrenal glands are unremarkable. There is a 9 mm nonobstructing left renal inferior pole calculus. No hydronephrosis. There is no hydronephrosis or nephrolithiasis on the right. Faint area of hypodensity in the right renal parenchyma is not evaluated but may represent a cyst or focal area of edema or developing abscess. There is right perinephric stranding. Correlation with urinalysis recommended to evaluate for possibility of pyelonephritis. The visualized ureters appear unremarkable. There is diffuse haziness of the bladder wall concerning for cystitis. There is loss of fat plane between the sigmoid colon and bladder dome with air within the urinary bladder most consistent with a colovesical fistula. Stomach/Bowel: There is sigmoid diverticulosis without definite active inflammation. There is no bowel obstruction. The appendix is normal. Vascular/Lymphatic: Moderate aortoiliac atherosclerotic disease. The IVC is unremarkable. No portal venous gas. There is no adenopathy. Reproductive: The prostate and seminal vesicles are grossly unremarkable. No pelvic mass. Other: None Musculoskeletal: Degenerative changes of the spine. No acute osseous pathology. IMPRESSION: 1. Sigmoid diverticulosis with colovesical fistula and findings of UTI and right-sided pyelonephritis. Developing abscess in the right kidney is not excluded. 2. A 9 mm nonobstructing left renal inferior pole calculus. No hydronephrosis. 3. Cholelithiasis. 4. No bowel obstruction. Normal  appendix. 5. Aortic Atherosclerosis (ICD10-I70.0). Electronically Signed   By: Arash  Radparvar M.D.   On: 08/12/2021 21:55    EKG: Independently reviewed.  EKG is reviewed and shows sinus tachycardia with incomplete right bundle branch block.  No acute ST elevation or depression.  QTc 446  Assessment/Plan Principal Problem:   Pyelonephritis Mr. Henton is admitted to Med-Surg floor.  Started on Zosyn for antibiotic coverage of early pyelonephritis.  There is also question of a colon to bladder fistula versus diverticulitis which Zosyn would also be indicated for. Urology's been consulted and will see patient in the morning for further evaluation and to determine if cystoscopy or other procedures needed IV fluid hydration with LR at 150 ml/hr overnight Tylenol as needed for fever  Active Problems:   Diabetes mellitus type 2, controlled  Continue home dose of metformin.  Blood sugar to be rechecked in the morning with labs.    AKI (acute kidney injury)  IV fluid hydration with LR as above.  We will recheck renal function and electrolytes with labs in morning    HTN (hypertension) Continue home medications of metoprolol and benazepril.  Monitor blood pressure    BPH (benign prostatic hyperplasia) Continue Flomax.    Hyponatremia Mild hyponatremia with a sodium of 130.  LR IV fluid as above.  Recheck electrolytes in morning    S/P CABG x 6 Stable  DVT prophylaxis: Lovenox for DVT prophylaxis.   Code Status:   Full Code  Family Communication:  Diagnosis and plan discussed with patient.  Patient verbalized understanding agrees with plan.  Further recommendation follow as clinical indicated Disposition Plan:   Patient is from:  Home   Anticipated DC to:  Home  Anticipated DC date:  Anticipate 2 midnight or more stay in the hospital  Consults called:  Urology consulted   by ER physician and will see in am  Admission status:  Inpatient   Bradley S Chotiner MD [facility]  How to contact the [facility] Attending or Consulting provider 7A - 7P or covering provider during after hours 7P -7A, for this patient?   Check the care team in [facility] and look for a) attending/consulting [facility] provider listed and b) the [facility] team listed Log into [email or web address] and use [facility]'s universal password to access. If you do not have the password, please contact the hospital operator. Locate the [facility] provider you are looking for under [facility] and page to a number that you can be directly reached. If you still have difficulty reaching the provider, please page the DOC (Director on Call) for the Hospitalists listed on amion for assistance.  08/13/2021, 12:14 AM      

## 2021-08-13 NOTE — Sepsis Progress Note (Signed)
Following per sepsis protocol   

## 2021-08-14 DIAGNOSIS — Z951 Presence of aortocoronary bypass graft: Secondary | ICD-10-CM

## 2021-08-14 DIAGNOSIS — I10 Essential (primary) hypertension: Secondary | ICD-10-CM

## 2021-08-14 DIAGNOSIS — N179 Acute kidney failure, unspecified: Secondary | ICD-10-CM | POA: Diagnosis not present

## 2021-08-14 DIAGNOSIS — N401 Enlarged prostate with lower urinary tract symptoms: Secondary | ICD-10-CM | POA: Diagnosis not present

## 2021-08-14 DIAGNOSIS — N321 Vesicointestinal fistula: Secondary | ICD-10-CM

## 2021-08-14 DIAGNOSIS — E871 Hypo-osmolality and hyponatremia: Secondary | ICD-10-CM

## 2021-08-14 DIAGNOSIS — E1165 Type 2 diabetes mellitus with hyperglycemia: Secondary | ICD-10-CM | POA: Diagnosis not present

## 2021-08-14 DIAGNOSIS — N12 Tubulo-interstitial nephritis, not specified as acute or chronic: Secondary | ICD-10-CM | POA: Diagnosis not present

## 2021-08-14 MED ORDER — SODIUM CHLORIDE 0.9 % IV SOLN
INTRAVENOUS | Status: DC
Start: 2021-08-14 — End: 2021-08-15

## 2021-08-14 MED ORDER — ENSURE ENLIVE PO LIQD
237.0000 mL | Freq: Two times a day (BID) | ORAL | Status: DC
Start: 2021-08-14 — End: 2021-08-19
  Administered 2021-08-14 – 2021-08-19 (×10): 237 mL via ORAL

## 2021-08-14 MED ORDER — ADULT MULTIVITAMIN W/MINERALS CH
1.0000 | ORAL_TABLET | Freq: Every day | ORAL | Status: DC
  Administered 2021-08-14 – 2021-08-19 (×6): 1 via ORAL
  Filled 2021-08-14 (×6): qty 1

## 2021-08-14 MED ORDER — PROSOURCE PLUS PO LIQD
30.0000 mL | Freq: Every day | ORAL | Status: DC
  Administered 2021-08-14 – 2021-08-18 (×5): 30 mL via ORAL
  Filled 2021-08-14 (×5): qty 30

## 2021-08-14 MED ORDER — GLUCERNA SHAKE PO LIQD: 237.0000 mL | Freq: Two times a day (BID) | ORAL | Status: DC

## 2021-08-14 MED ORDER — METOPROLOL TARTRATE 12.5 MG HALF TABLET
12.5000 mg | ORAL_TABLET | Freq: Two times a day (BID) | ORAL | Status: DC
  Administered 2021-08-14 – 2021-08-19 (×10): 12.5 mg via ORAL
  Filled 2021-08-14 (×10): qty 1

## 2021-08-14 MED ORDER — HYDROMORPHONE HCL 1 MG/ML IJ SOLN: 0.5000 mg | INTRAMUSCULAR | Status: DC | PRN

## 2021-08-14 MED ORDER — SODIUM CHLORIDE 0.9 % IV BOLUS
1000.0000 mL | Freq: Once | INTRAVENOUS | Status: AC
  Administered 2021-08-14: 1000 mL via INTRAVENOUS

## 2021-08-14 NOTE — Evaluation (Signed)
Physical Therapy Evaluation and Discharge Patient Details Name: Brian Mccullough MRN: [id number] DOB: 1950-11-07 Today's Date: 08/14/2021  History of Present Illness  Pt is a 70 y.o. F who presents 08/12/2021 for evaluation of fever, dysuria, urinary hesitancy for 3-4 days. Found to have sigmoid diverticulitis with colovesicle fistula, UTI and pyelonephritis. Surgery consulted who recommended no acute surgery and continuation of IV Zosyn. Significant PMH: CAD s/p CABG, HTN, and DM2.  Clinical Impression  Patient evaluated by Physical Therapy with no further acute PT needs identified. Pt reports improved pain and appetite today. Overall, he is moving well, ambulating 540 feet independently with no assistive device. Encouraged continued ambulation/mobilization while inpatient. All education has been completed and the patient has no further questions. No follow-up Physical Therapy or equipment needs. PT is signing off. Thank you for this referral.      Recommendations for follow up therapy are one component of a multi-disciplinary discharge planning process, led by the attending physician.  Recommendations may be updated based on patient status, additional functional criteria and insurance authorization.  Follow Up Recommendations No PT follow up    Assistance Recommended at Discharge None  Functional Status Assessment Patient has not had a recent decline in their functional status  Equipment Recommendations  None recommended by PT    Recommendations for Other Services       Precautions / Restrictions Precautions Precautions: None Restrictions Weight Bearing Restrictions: No      Mobility  Bed Mobility Overal bed mobility: Modified Independent                  Transfers Overall transfer level: Independent Equipment used: None                    Ambulation/Gait Ambulation/Gait assistance: Independent Gait Distance (Feet): 540 Feet Assistive device: None Gait  Pattern/deviations: WFL(Within Functional Limits)        Stairs            Wheelchair Mobility    Modified Rankin (Stroke Patients Only)       Balance Overall balance assessment: No apparent balance deficits (not formally assessed)                                           Pertinent Vitals/Pain Pain Assessment: Faces Faces Pain Scale: Hurts a little bit Pain Location: bladder discomfort Pain Descriptors / Indicators: Discomfort Pain Intervention(s): Monitored during session    Home Living Family/patient expects to be discharged to:: Private residence Living Arrangements: Spouse/significant other Available Help at Discharge: Family Type of Home: House Home Access: Stairs to enter   Technical brewer of Steps: 3            Prior Function Prior Level of Function : Independent/Modified Independent             Mobility Comments: Retired, enjoys walking and spending time with his grandchildren       Journalist, newspaper        Extremity/Trunk Assessment   Upper Extremity Assessment Upper Extremity Assessment: Overall WFL for tasks assessed    Lower Extremity Assessment Lower Extremity Assessment: Overall WFL for tasks assessed    Cervical / Trunk Assessment Cervical / Trunk Assessment: Normal  Communication   Communication: No difficulties  Cognition Arousal/Alertness: Awake/alert Behavior During Therapy: WFL for tasks assessed/performed Overall Cognitive Status: Within Functional Limits for tasks assessed  General Comments      Exercises     Assessment/Plan    PT Assessment Patient does not need any further PT services  PT Problem List         PT Treatment Interventions      PT Goals (Current goals can be found in the Care Plan section)  Acute Rehab PT Goals Patient Stated Goal: resolution of symptoms PT Goal Formulation: All assessment and education  complete, DC therapy    Frequency     Barriers to discharge        Co-evaluation               AM-PAC PT "6 Clicks" Mobility  Outcome Measure Help needed turning from your back to your side while in a flat bed without using bedrails?: None Help needed moving from lying on your back to sitting on the side of a flat bed without using bedrails?: None Help needed moving to and from a bed to a chair (including a wheelchair)?: None Help needed standing up from a chair using your arms (e.g., wheelchair or bedside chair)?: None Help needed to walk in hospital room?: None Help needed climbing 3-5 steps with a railing? : None 6 Click Score: 24    End of Session   Activity Tolerance: Patient tolerated treatment well Patient left: in chair;with call bell/phone within reach Nurse Communication: Mobility status PT Visit Diagnosis: Difficulty in walking, not elsewhere classified (R26.2)    Time: 4034-7425 PT Time Calculation (min) (ACUTE ONLY): 14 min   Charges:   PT Evaluation $PT Eval Low Complexity: [place], PT, DPT Acute Rehabilitation Services Pager [phone] Office [phone]   Brian Mccullough 08/14/2021, 9:57 AM

## 2021-08-14 NOTE — Progress Notes (Signed)
PROGRESS NOTE  OTHER ATIENZA FHL:[id number] DOB: [birth year]   PCP: Cassandria Anger, MD  Patient is from: Home.  DOA: 08/12/2021 LOS: 1  Chief complaints:  Fever, dysuria, urinary hesitancy  Brief Narrative / Interim history: 70 year old M with PMH of CAD/CABG in 2021, DM-2, HTN, HLD and BPH presenting with fever, dysuria, urinary hesitancy, debris-filled urine and right flank pain and admitted with pyelonephritis with colovesical fistula.  Cultures obtained.  He was started on IV Zosyn.  Urology and general surgery consulted, and recommended antibiotics with outpatient follow-up.  Blood cultures NGTD.  Patient improving.  Subjective: Seen and examined earlier this morning.  No major events overnight of this morning.  Reports improvement in his back pain and groin pain.  Urine culture with ice tea color.  No further debris's in urine.  He denies dysuria but still with frequency and urgency.  He denies nausea and vomiting.  Objective: Vitals:   08/13/21 1700 08/13/21 2047 08/14/21 0405 08/14/21 0804  BP: 110/68 116/63 136/69 (!) 141/58  Pulse: 96 92 87 90  Resp: 18 18 16 18   Temp: 98.8 F (37.1 C) 99.5 F (37.5 C) 99.8 F (37.7 C) 99.2 F (37.3 C)  TempSrc: Oral Oral Oral Oral  SpO2: 100% 98% 100% 100%  Weight:      Height:        Intake/Output Summary (Last 24 hours) at 08/14/2021 1139 Last data filed at 08/14/2021 0600 Gross per 24 hour  Intake 1650 ml  Output 1550 ml  Net 100 ml   Filed Weights   08/12/21 2229  Weight: 104.3 kg    Examination:  GENERAL: No apparent distress.  Nontoxic. HEENT: MMM.  Vision and hearing grossly intact.  NECK: Supple.  No apparent JVD.  RESP:  No IWOB.  Fair aeration bilaterally. CVS:  RRR. Heart sounds normal.  ABD/GI/GU: BS+. Abd soft, NTND.  MSK/EXT:  Moves extremities. No apparent deformity. No edema.  SKIN: no apparent skin lesion or wound NEURO: Awake, alert and oriented appropriately.  No apparent focal neuro  deficit. PSYCH: Calm. Normal affect.   Procedures:  None  Microbiology summarized: HTDSK-87 and influenza PCR nonreactive. Blood cultures NGTD. Urine culture pending  Assessment & Plan: Severe sepsis due to acute right pyelonephritis complicated by colovesical fistula: POA -Was febrile with tachycardia and AKI on presentation. 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 -Plan for IV antibiotics and outpatient follow-up with urology and colorectal surgery -Urology recommended PVR and suppressive antibiotics until fistula is addressed  AKI/azotemia: Improving. Recent Labs    10/19/20 1023 03/29/21 0946 04/26/21 0926 08/12/21 1330 08/13/21 0558  BUN 13 13 11 20  26*  CREATININE 1.10 1.17 0.97 1.42* 1.36*  -Continue IV fluid  Hyponatremia: Improving. Recent Labs  Lab 08/12/21 1330 08/13/21 0558  NA 130* 132*  -Change LR to NS  History of CAD/CABG in 07/2020.  No anginal symptoms. -Continue home meds -Followed by cardiology for statin intolerance  Controlled NIDDM-2: A1c <6.0% since 2012.  On low-dose metformin at home.  BMP glucose within appropriate range -Continue home metformin  Essential hypertension: Normotensive. -Continue home metoprolol and benazepril  BPH: Patient stopped taking his Flomax due to dizziness -Continue home Flomax -Recommend taking this at night   Body mass index is 28.75 kg/m.         DVT prophylaxis:  enoxaparin (LOVENOX) injection 40 mg Start: 08/13/21 1000  Code Status: Full code Family Communication:  Patient and/or RN. Available if any question.  Level of care: Med-Surg Status is: Inpatient  Remains inpatient appropriate because: Need for IV antibiotics for pyelonephritis and complicated UTI pending urine cultures     Consultants:  Urology General surgery   Sch Meds:  Scheduled Meds:  aspirin EC  81 mg Oral Daily    atorvastatin  40 mg Oral QODAY   benazepril  20 mg Oral Daily   clopidogrel  75 mg Oral Daily   enoxaparin (LOVENOX) injection  40 mg Subcutaneous Q24H   influenza vaccine adjuvanted  0.5 mL Intramuscular Tomorrow-1000   metFORMIN  500 mg Oral Q breakfast   metoprolol tartrate  25 mg Oral BID   tamsulosin  0.4 mg Oral Daily   cyanocobalamin  1,000 mcg Oral Daily   Continuous Infusions:  sodium chloride     piperacillin-tazobactam (ZOSYN)  IV 3.375 g (08/14/21 0548)   PRN Meds:.acetaminophen **OR** acetaminophen, HYDROmorphone (DILAUDID) injection, ondansetron **OR** ondansetron (ZOFRAN) IV, oxyCODONE, senna-docusate, traZODone  Antimicrobials: Anti-infectives (From admission, onward)    Start     Dose/Rate Route Frequency Ordered Stop   08/13/21 0600  piperacillin-tazobactam (ZOSYN) IVPB 3.375 g        3.375 g 12.5 mL/hr over 240 Minutes Intravenous Every 8 hours 08/13/21 0141     08/13/21 0000  piperacillin-tazobactam (ZOSYN) IVPB 3.375 g        3.375 g 100 mL/hr over 30 Minutes Intravenous  Once 08/12/21 2347 08/13/21 0129        I have personally reviewed the following labs and images: CBC: Recent Labs  Lab 08/12/21 1330 08/13/21 0558  WBC 10.1 10.1  NEUTROABS 7.2  --   HGB 13.3 11.7*  HCT 39.6 34.8*  MCV 85.3 84.7  PLT 195 166   BMP &GFR Recent Labs  Lab 08/12/21 1330 08/13/21 0558  NA 130* 132*  K 3.9 3.8  CL 100 104  CO2 20* 19*  GLUCOSE 105* 110*  BUN 20 26*  CREATININE 1.42* 1.36*  CALCIUM 8.4* 8.2*   Estimated Creatinine Clearance: 67 mL/min (A) (by C-G formula based on SCr of 1.36 mg/dL (H)). Liver & Pancreas: Recent Labs  Lab 08/12/21 1330  AST 31  ALT 20  ALKPHOS 70  BILITOT 2.0*  PROT 6.9  ALBUMIN 2.9*   No results for input(s): LIPASE, AMYLASE in the last 168 hours. No results for input(s): AMMONIA in the last 168 hours. Diabetic: No results for input(s): HGBA1C in the last 72 hours. No results for input(s): GLUCAP in the last  168 hours. Cardiac Enzymes: No results for input(s): CKTOTAL, CKMB, CKMBINDEX, TROPONINI in the last 168 hours. No results for input(s): PROBNP in the last 8760 hours. Coagulation Profile: No results for input(s): INR, PROTIME in the last 168 hours. Thyroid Function Tests: No results for input(s): TSH, T4TOTAL, FREET4, T3FREE, THYROIDAB in the last 72 hours. Lipid Profile: No results for input(s): CHOL, HDL, LDLCALC, TRIG, CHOLHDL, LDLDIRECT in the last 72 hours. Anemia Panel: No results for input(s): VITAMINB12, FOLATE, FERRITIN, TIBC, IRON, RETICCTPCT in the last 72 hours. Urine analysis:    Component Value Date/Time   COLORURINE AMBER (A) 08/12/2021 1330   APPEARANCEUR CLOUDY (A) 08/12/2021 1330   LABSPEC 1.015 08/12/2021 1330   PHURINE 6.0 08/12/2021 1330   GLUCOSEU NEGATIVE 08/12/2021 1330   GLUCOSEU NEGATIVE 07/14/2021 1548   HGBUR MODERATE (A) 08/12/2021 1330   BILIRUBINUR NEGATIVE 08/12/2021 1330   BILIRUBINUR negative 07/09/2014 [place] 08/12/2021 1330  PROTEINUR 100 (A) 08/12/2021 1330   UROBILINOGEN 2.0 (A) 07/14/2021 1548   NITRITE NEGATIVE 08/12/2021 1330   LEUKOCYTESUR LARGE (A) 08/12/2021 1330   Sepsis Labs: Invalid input(s): PROCALCITONIN, [place]  Microbiology: Recent Results (from the past 240 hour(s))  Resp Panel by RT-PCR (Flu A&B, Covid) Nasopharyngeal Swab     Status: None   Collection Time: 08/12/21  1:31 PM   Specimen: Nasopharyngeal Swab; Nasopharyngeal(NP) swabs in vial transport medium  Result Value Ref Range Status   SARS Coronavirus 2 by RT PCR NEGATIVE NEGATIVE Final    Comment: (NOTE) SARS-CoV-2 target nucleic acids are NOT DETECTED.  The SARS-CoV-2 RNA is generally detectable in upper respiratory specimens during the acute phase of infection. The lowest concentration of SARS-CoV-2 viral copies this assay can detect is 138 copies/mL. A negative result does not preclude SARS-Cov-2 infection and should not be used as  the sole basis for treatment or other patient management decisions. A negative result may occur with  improper specimen collection/handling, submission of specimen other than nasopharyngeal swab, presence of viral mutation(s) within the areas targeted by this assay, and inadequate number of viral copies(<138 copies/mL). A negative result must be combined with clinical observations, patient history, and epidemiological information. The expected result is Negative.  Fact Sheet for Patients:  EntrepreneurPulse.com.au  Fact Sheet for Healthcare Providers:  IncredibleEmployment.be  This test is no t yet approved or cleared by the [place] FDA and  has been authorized for detection and/or diagnosis of SARS-CoV-2 by FDA under an Emergency Use Authorization (EUA). This EUA will remain  in effect (meaning this test can be used) for the duration of the COVID-19 declaration under Section 564(b)(1) of the Act, 21 U.S.C.section 360bbb-3(b)(1), unless the authorization is terminated  or revoked sooner.       Influenza A by PCR NEGATIVE NEGATIVE Final   Influenza B by PCR NEGATIVE NEGATIVE Final    Comment: (NOTE) The Xpert Xpress SARS-CoV-2/FLU/RSV plus assay is intended as an aid in the diagnosis of influenza from Nasopharyngeal swab specimens and should not be used as a sole basis for treatment. Nasal washings and aspirates are unacceptable for Xpert Xpress SARS-CoV-2/FLU/RSV testing.  Fact Sheet for Patients: EntrepreneurPulse.com.au  Fact Sheet for Healthcare Providers: IncredibleEmployment.be  This test is not yet approved or cleared by the [place] FDA and has been authorized for detection and/or diagnosis of SARS-CoV-2 by FDA under an Emergency Use Authorization (EUA). This EUA will remain in effect (meaning this test can be used) for the duration of the COVID-19 declaration under Section 564(b)(1) of  the Act, 21 U.S.C. section 360bbb-3(b)(1), unless the authorization is terminated or revoked.  Performed at [facility], [place] [address] [place], [place] [address]   Culture, blood (routine x 2)     Status: None (Preliminary result)   Collection Time: 08/13/21 12:30 AM   Specimen: BLOOD LEFT FOREARM  Result Value Ref Range Status   Specimen Description BLOOD LEFT FOREARM  Final   Special Requests   Final    BOTTLES DRAWN AEROBIC AND ANAEROBIC Blood Culture adequate volume   Culture   Final    NO GROWTH 1 DAY Performed at [facility], [place] [address] [place], [place] [address]    Report Status PENDING  Incomplete  Culture, blood (routine x 2)     Status: None (Preliminary result)   Collection Time: 08/13/21 12:42 AM   Specimen: BLOOD RIGHT FOREARM  Result Value Ref Range Status   Specimen  Description BLOOD RIGHT FOREARM  Final   Special Requests   Final    BOTTLES DRAWN AEROBIC AND ANAEROBIC Blood Culture adequate volume   Culture   Final    NO GROWTH 1 DAY Performed at [facility], [address], [place], [place] [address]    Report Status PENDING  Incomplete  Culture, blood (single)     Status: None (Preliminary result)   Collection Time: 08/13/21  5:58 AM   Specimen: BLOOD RIGHT HAND  Result Value Ref Range Status   Specimen Description BLOOD RIGHT HAND  Final   Special Requests AEROBIC BOTTLE ONLY Blood Culture adequate volume  Final   Culture   Final    NO GROWTH 1 DAY Performed at [facility], [place] [address] [place], [place] [address]    Report Status PENDING  Incomplete    Radiology Studies: No results found.    Jovante Hammitt T. Uvalda  If 7PM-7AM, please contact night-coverage [email or web address] 08/14/2021, 11:39 AM

## 2021-08-14 NOTE — Progress Notes (Signed)
Initial Nutrition Assessment RD working remotely.  DOCUMENTATION CODES:   Not applicable  INTERVENTION:  - will order Ensure Enlive BID, each supplement provides 350 kcal and 20 grams of protein. - will order 30 ml Prosource Plus once/day, each supplement provides 100 kcal and 15 grams protein.  - will order 1 tablet multivitamin with minerals/day. - complete NFPE at follow-up.    NUTRITION DIAGNOSIS:   Increased nutrient needs related to acute illness as evidenced by estimated needs.  GOAL:   Patient will meet greater than or equal to 90% of their needs  MONITOR:   PO intake, Supplement acceptance, Labs, Weight trends  REASON FOR ASSESSMENT:   Malnutrition Screening Tool  ASSESSMENT:   70 y.o. male with medical history of CAD, HTN, HLD, type 2 DM, and BPH on Flomax until 1 week ago (stopped taking it as it was making him dizzy). He presented to the ED due to fever, dysuria, urinary hesitancy for 3-4 days. No N/V PTA and only 1 episode of diarrhea.  Patient ate 75% at lunch, 100% at lunch, and 100% at dinner yesterday. He ate 100% of a late breakfast this AM.   Patient denies any changes in appetite or PO intakes PTA but that he was losting weight over the past ~1 week, when symptoms began.   Weight on 11/4 was documented as 230 lb, which appears to be a stated weight although weight was stable at about this (226-232 lb) from 11/15/20-05/13/21. Weight on 07/20/21 was 238 lb.  This would indicate 8 lb weight loss (3.4% body weight) in the past ~3 weeks.   Per notes: - severe sepsis d/t acute R pyelonephritis - colovesical fistula in setting of sigmoid diverticulosis  - AKI--improving - hyponatremia--improving   Labs reviewed; Na: 132 mmol/l, BUN: 26 mg/dl, creatinine: 1.36 mg/dl, Ca: 8.2 mg/dl, GFR: 56 ml/min.  Medications reviewed; 1000 mcg oral cyanocobalamin/day. IVF; NS @ 100 ml/hr.     NUTRITION - FOCUSED PHYSICAL EXAM:  Unable to complete at this time.    Diet Order:   Diet Order             Diet heart healthy/carb modified Room service appropriate? Yes; Fluid consistency: Thin  Diet effective now                   EDUCATION NEEDS:   No education needs have been identified at this time  Skin:  Skin Assessment: Reviewed RN Assessment  Last BM:  PTA/unknown  Height:   Ht Readings from Last 1 Encounters:  08/12/21 6\' 3"  (1.905 m)    Weight:   Wt Readings from Last 1 Encounters:  08/12/21 104.3 kg    Estimated Nutritional Needs:  Kcal:  2250-2500 kcal Protein:  115-130 grams Fluid:  >/= 2.5 L/day      Jarome Matin, MS, RD, LDN, CNSC Inpatient Clinical Dietitian RD pager # available in AMION  After hours/weekend pager # available in [facility]

## 2021-08-15 ENCOUNTER — Encounter: Payer: Medicare Other | Admitting: Internal Medicine

## 2021-08-15 ENCOUNTER — Telehealth: Payer: Self-pay | Admitting: Internal Medicine

## 2021-08-15 DIAGNOSIS — E1165 Type 2 diabetes mellitus with hyperglycemia: Secondary | ICD-10-CM | POA: Diagnosis not present

## 2021-08-15 DIAGNOSIS — N12 Tubulo-interstitial nephritis, not specified as acute or chronic: Secondary | ICD-10-CM | POA: Diagnosis not present

## 2021-08-15 DIAGNOSIS — E876 Hypokalemia: Secondary | ICD-10-CM

## 2021-08-15 DIAGNOSIS — N401 Enlarged prostate with lower urinary tract symptoms: Secondary | ICD-10-CM | POA: Diagnosis not present

## 2021-08-15 DIAGNOSIS — N179 Acute kidney failure, unspecified: Secondary | ICD-10-CM | POA: Diagnosis not present

## 2021-08-15 LAB — RENAL FUNCTION PANEL
Albumin: 2 g/dL — ABNORMAL LOW (ref 3.5–5.0)
Anion gap: 9 (ref 5–15)
BUN: 22 mg/dL (ref 8–23)
CO2: 20 mmol/L — ABNORMAL LOW (ref 22–32)
Calcium: 8 mg/dL — ABNORMAL LOW (ref 8.9–10.3)
Chloride: 104 mmol/L (ref 98–111)
Creatinine, Ser: 1.1 mg/dL (ref 0.61–1.24)
GFR, Estimated: >60 mL/min (ref >60)
Glucose, Bld: 106 mg/dL — ABNORMAL HIGH (ref 70–99)
Phosphorus: 2.7 mg/dL (ref 2.5–4.6)
Potassium: 3.4 mmol/L — ABNORMAL LOW (ref 3.5–5.1)
Sodium: 133 mmol/L — ABNORMAL LOW (ref 135–145)

## 2021-08-15 LAB — CBC
HCT: 33.8 % — ABNORMAL LOW (ref 39.0–52.0)
Hemoglobin: 11.2 g/dL — ABNORMAL LOW (ref 13.0–17.0)
MCH: 28.2 pg (ref 26.0–34.0)
MCHC: 33.1 g/dL (ref 30.0–36.0)
MCV: 85.1 fL (ref 80.0–100.0)
Platelets: 209 10*3/uL (ref 150–400)
RBC: 3.97 MIL/uL — ABNORMAL LOW (ref 4.22–5.81)
RDW: 14.3 % (ref 11.5–15.5)
WBC: 7.3 10*3/uL (ref 4.0–10.5)
nRBC: 0 % (ref 0.0–0.2)

## 2021-08-15 LAB — URINE CULTURE

## 2021-08-15 LAB — MAGNESIUM: Magnesium: 1.5 mg/dL — ABNORMAL LOW (ref 1.7–2.4)

## 2021-08-15 MED ORDER — MAGNESIUM SULFATE 2 GM/50ML IV SOLN
2.0000 g | Freq: Once | INTRAVENOUS | Status: AC
  Administered 2021-08-15: 2 g via INTRAVENOUS
  Filled 2021-08-15: qty 50

## 2021-08-15 MED ORDER — POTASSIUM CHLORIDE CRYS ER 20 MEQ PO TBCR
40.0000 meq | EXTENDED_RELEASE_TABLET | Freq: Once | ORAL | Status: AC
  Administered 2021-08-15: 40 meq via ORAL
  Filled 2021-08-15: qty 2

## 2021-08-15 MED ORDER — PHENAZOPYRIDINE HCL 200 MG PO TABS
200.0000 mg | ORAL_TABLET | Freq: Three times a day (TID) | ORAL | Status: AC
  Administered 2021-08-15 – 2021-08-17 (×6): 200 mg via ORAL
  Filled 2021-08-15 (×6): qty 1

## 2021-08-15 MED ORDER — COVID-19MRNA BIVAL VACC PFIZER 30 MCG/0.3ML IM SUSP
0.3000 mL | Freq: Once | INTRAMUSCULAR | Status: AC
  Administered 2021-08-16: 0.3 mL via INTRAMUSCULAR
  Filled 2021-08-15: qty 0.3

## 2021-08-15 NOTE — Plan of Care (Signed)

## 2021-08-15 NOTE — Progress Notes (Signed)
Notified Dr. Velia Meyer on call regarding patient complaint of increased frequency and urgency of urination to the point that he voided on the floor a few times. Patient also complaint that he noticed that both his legs are swollen. RN noted that there's trace non pitting edema BLE at this time. Patient is concern about leg swelling and bladder spasms and the urgency and frequency of urination. Labs were chrecked.

## 2021-08-15 NOTE — Progress Notes (Signed)
Mobility Specialist Progress Note  08/15/21 1244  Mobility  Activity Ambulated in hall  Level of Assistance Independent  Distance Ambulated (ft) 550 ft  Mobility Ambulated independently in hallway  Mobility Response Tolerated well  Mobility performed by Mobility specialist  $Mobility charge 1 Mobility   Received pt in bed having no complaints and agreeable to mobility. Asymptomatic throughout ambulation, returned back to bed w/ call bell by side and all needs met.  Holland Falling Mobility Specialist Phone Number [phone]

## 2021-08-15 NOTE — Telephone Encounter (Signed)
Patient's spouse Ivin Booty want to make provider aware that patient was been admitted to the hospital on 08-12-2021

## 2021-08-15 NOTE — Progress Notes (Signed)
PROGRESS NOTE  Brian Mccullough IHW:[id number] DOB: 1950-12-22   PCP: Cassandria Anger, MD  Patient is from: Home.  DOA: 08/12/2021 LOS: 2  Chief complaints:  Fever, dysuria, urinary hesitancy  Brief Narrative / Interim history: 70 year old M with PMH of CAD/CABG in 2021, DM-2, HTN, HLD and BPH presenting with fever, dysuria, urinary hesitancy, debris-filled urine and right flank pain and admitted with pyelonephritis with colovesical fistula.  Cultures obtained.  He was started on IV Zosyn.  Urology and general surgery consulted, and recommended antibiotics with outpatient follow-up.  Blood cultures NGTD.  Urine culture with multiple species.  Repeat urine culture pending.  Subjective: Seen and examined earlier this morning.  She reports feeling miserable.  He says he had to go to the bathroom every 45 minutes to urinate.  He also reports dysuria and debris's toward the end of micturition.  He is also concerned about lower extremity edema.  Back and flank pain has improved.   Objective: Vitals:   08/14/21 1819 08/14/21 2105 08/15/21 0407 08/15/21 1342  BP: 128/63 120/72 (!) 143/64 136/75  Pulse: 94 77 80 65  Resp: 18 18 18 17   Temp: 98.8 F (37.1 C) 98.6 F (37 C) 98.4 F (36.9 C) 98.5 F (36.9 C)  TempSrc: Oral Oral Oral Oral  SpO2: 100% 100% 100% 100%  Weight:      Height:        Intake/Output Summary (Last 24 hours) at 08/15/2021 1429 Last data filed at 08/15/2021 0700 Gross per 24 hour  Intake 2231.69 ml  Output 400 ml  Net 1831.69 ml   Filed Weights   08/12/21 2229  Weight: 104.3 kg    Examination:  GENERAL: No apparent distress.  Nontoxic. HEENT: MMM.  Vision and hearing grossly intact.  NECK: Supple.  No apparent JVD.  RESP: 100% on RA.  No IWOB.  Fair aeration bilaterally. CVS:  RRR. Heart sounds normal.  ABD/GI/GU: BS+. Abd soft, NTND.  MSK/EXT:  Moves extremities. No apparent deformity. No edema.  SKIN: no apparent skin lesion or wound NEURO: Awake  and alert. Oriented appropriately.  No apparent focal neuro deficit. PSYCH: Calm. Normal affect.  Procedures:  None  Microbiology summarized: [id number] and influenza PCR nonreactive. Blood cultures NGTD. Initial urine culture with multiple species. Repeat urine culture pending.  Assessment & Plan: Severe sepsis due to acute right pyelonephritis complicated by colovesical fistula: POA.  Was febrile with tachycardia and AKI POA.  Patient continues to endorse urinary frequency, dysuria and decreasing urine.  -Continue IV Zosyn pending repeat urine culture -Discussed with Dr. Grandville Silos and Dr. 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 -Plan for IV antibiotics and outpatient follow-up with urology and colorectal surgery -Urology recommended PVR, Flomax, Azo and suppressive antibiotics after initial treatment.  AKI/azotemia: Resolved. Recent Labs    10/19/20 1023 03/29/21 0946 04/26/21 0926 08/12/21 1330 08/13/21 0558 08/15/21 0142  BUN 13 13 11 20  26* 22  CREATININE 1.10 1.17 0.97 1.42* 1.36* 1.10  -IV fluid discontinued  Hyponatremia: Improving. Recent Labs  Lab 08/12/21 1330 08/13/21 0558 08/15/21 0142  NA 130* 132* 133*  -Monitor off IV fluid.  History of CAD/CABG in 07/2020.  No anginal symptoms. -Followed by cardiology for statin intolerance -Plavix discontinued.  Per patient, plan to end on 11/1.  Controlled NIDDM-2: A1c <6.0% since 2012.  On low-dose metformin at home.  BMP glucose within appropriate range -Continue home metformin  Hypotension  with history of essential hypertension: Hypotension seems to have resolved. -Discontinue IV fluid -Discontinued benazepril. -Continue home metoprolol  BPH: Patient stopped taking his Flomax due to dizziness -Continue home Flomax -Recommend taking this at night  Trace edema: Likely due to IV fluid. -IV fluid  discontinued.  Hypokalemia/hypomagnesemia -Replenish and recheck.  COVID-19 vaccination: Patient is vaccinated including booster but likes to get the newer booster. -Booster vaccine ordered.  Increased nutrient needs Body mass index is 28.75 kg/m. Nutrition Problem: Increased nutrient needs Etiology: acute illness Signs/Symptoms: estimated needs Interventions: Prostat, MVI, Ensure Enlive (each supplement provides 350kcal and 20 grams of protein)   DVT prophylaxis:  enoxaparin (LOVENOX) injection 40 mg Start: 08/13/21 1000  Code Status: Full code Family Communication: Updated patient's daughter-in-law, Velna Hatchet over the phone at patient's request. Level of care: Med-Surg Status is: Inpatient  Remains inpatient appropriate because: Need for IV antibiotics for pyelonephritis and complicated UTI pending urine cultures    Consultants:  Urology General surgery   Sch Meds:  Scheduled Meds:  (feeding supplement) PROSource Plus  30 mL Oral Daily   aspirin EC  81 mg Oral Daily   [START ON 08/16/2021] COVID-19 mRNA bivalent vaccine (Pfizer)  0.3 mL Intramuscular ONCE-1600   enoxaparin (LOVENOX) injection  40 mg Subcutaneous Q24H   feeding supplement  237 mL Oral BID BM   influenza vaccine adjuvanted  0.5 mL Intramuscular Tomorrow-1000   metFORMIN  500 mg Oral Q breakfast   metoprolol tartrate  12.5 mg Oral BID   multivitamin with minerals  1 tablet Oral Daily   phenazopyridine  200 mg Oral TID WC   tamsulosin  0.4 mg Oral Daily   cyanocobalamin  1,000 mcg Oral Daily   Continuous Infusions:  piperacillin-tazobactam (ZOSYN)  IV 3.375 g (08/15/21 1412)   PRN Meds:.acetaminophen **OR** acetaminophen, HYDROmorphone (DILAUDID) injection, ondansetron **OR** ondansetron (ZOFRAN) IV, oxyCODONE, senna-docusate, traZODone  Antimicrobials: Anti-infectives (From admission, onward)    Start     Dose/Rate Route Frequency Ordered Stop   08/13/21 0600  piperacillin-tazobactam (ZOSYN) IVPB  3.375 g        3.375 g 12.5 mL/hr over 240 Minutes Intravenous Every 8 hours 08/13/21 0141     08/13/21 0000  piperacillin-tazobactam (ZOSYN) IVPB 3.375 g        3.375 g 100 mL/hr over 30 Minutes Intravenous  Once 08/12/21 2347 08/13/21 0129        I have personally reviewed the following labs and images: CBC: Recent Labs  Lab 08/12/21 1330 08/13/21 0558 08/15/21 0142  WBC 10.1 10.1 7.3  NEUTROABS 7.2  --   --   HGB 13.3 11.7* 11.2*  HCT 39.6 34.8* 33.8*  MCV 85.3 84.7 85.1  PLT 195 166 209   BMP &GFR Recent Labs  Lab 08/12/21 1330 08/13/21 0558 08/15/21 0142  NA 130* 132* 133*  K 3.9 3.8 3.4*  CL 100 104 104  CO2 20* 19* 20*  GLUCOSE 105* 110* 106*  BUN 20 26* 22  CREATININE 1.42* 1.36* 1.10  CALCIUM 8.4* 8.2* 8.0*  MG  --   --  1.5*  PHOS  --   --  2.7   Estimated Creatinine Clearance: 82.8 mL/min (by C-G formula based on SCr of 1.1 mg/dL). Liver & Pancreas: Recent Labs  Lab 08/12/21 1330 08/15/21 0142  AST 31  --   ALT 20  --   ALKPHOS 70  --   BILITOT 2.0*  --   PROT 6.9  --   ALBUMIN 2.9* 2.0*  No results for input(s): LIPASE, AMYLASE in the last 168 hours. No results for input(s): AMMONIA in the last 168 hours. Diabetic: No results for input(s): HGBA1C in the last 72 hours. No results for input(s): GLUCAP in the last 168 hours. Cardiac Enzymes: No results for input(s): CKTOTAL, CKMB, CKMBINDEX, TROPONINI in the last 168 hours. No results for input(s): PROBNP in the last 8760 hours. Coagulation Profile: No results for input(s): INR, PROTIME in the last 168 hours. Thyroid Function Tests: No results for input(s): TSH, T4TOTAL, FREET4, T3FREE, THYROIDAB in the last 72 hours. Lipid Profile: No results for input(s): CHOL, HDL, LDLCALC, TRIG, CHOLHDL, LDLDIRECT in the last 72 hours. Anemia Panel: No results for input(s): VITAMINB12, FOLATE, FERRITIN, TIBC, IRON, RETICCTPCT in the last 72 hours. Urine analysis:    Component Value Date/Time    COLORURINE AMBER (A) 08/12/2021 1330   APPEARANCEUR CLOUDY (A) 08/12/2021 1330   LABSPEC 1.015 08/12/2021 1330   PHURINE 6.0 08/12/2021 1330   GLUCOSEU NEGATIVE 08/12/2021 1330   GLUCOSEU NEGATIVE 07/14/2021 1548   HGBUR MODERATE (A) 08/12/2021 1330   BILIRUBINUR NEGATIVE 08/12/2021 1330   BILIRUBINUR negative 07/09/2014 1019   KETONESUR NEGATIVE 08/12/2021 1330   PROTEINUR 100 (A) 08/12/2021 1330   UROBILINOGEN 2.0 (A) 07/14/2021 1548   NITRITE NEGATIVE 08/12/2021 1330   LEUKOCYTESUR LARGE (A) 08/12/2021 1330   Sepsis Labs: Invalid input(s): PROCALCITONIN, [place]  Microbiology: Recent Results (from the past 240 hour(s))  Resp Panel by RT-PCR (Flu A&B, Covid) Nasopharyngeal Swab     Status: None   Collection Time: 08/12/21  1:31 PM   Specimen: Nasopharyngeal Swab; Nasopharyngeal(NP) swabs in vial transport medium  Result Value Ref Range Status   SARS Coronavirus 2 by RT PCR NEGATIVE NEGATIVE Final    Comment: (NOTE) SARS-CoV-2 target nucleic acids are NOT DETECTED.  The SARS-CoV-2 RNA is generally detectable in upper respiratory specimens during the acute phase of infection. The lowest concentration of SARS-CoV-2 viral copies this assay can detect is 138 copies/mL. A negative result does not preclude SARS-Cov-2 infection and should not be used as the sole basis for treatment or other patient management decisions. A negative result may occur with  improper specimen collection/handling, submission of specimen other than nasopharyngeal swab, presence of viral mutation(s) within the areas targeted by this assay, and inadequate number of viral copies(<138 copies/mL). A negative result must be combined with clinical observations, patient history, and epidemiological information. The expected result is Negative.  Fact Sheet for Patients:  EntrepreneurPulse.com.au  Fact Sheet for Healthcare Providers:  IncredibleEmployment.be  This test  is no t yet approved or cleared by the [place] FDA and  has been authorized for detection and/or diagnosis of SARS-CoV-2 by FDA under an Emergency Use Authorization (EUA). This EUA will remain  in effect (meaning this test can be used) for the duration of the COVID-19 declaration under Section 564(b)(1) of the Act, 21 U.S.C.section 360bbb-3(b)(1), unless the authorization is terminated  or revoked sooner.       Influenza A by PCR NEGATIVE NEGATIVE Final   Influenza B by PCR NEGATIVE NEGATIVE Final    Comment: (NOTE) The Xpert Xpress SARS-CoV-2/FLU/RSV plus assay is intended as an aid in the diagnosis of influenza from Nasopharyngeal swab specimens and should not be used as a sole basis for treatment. Nasal washings and aspirates are unacceptable for Xpert Xpress SARS-CoV-2/FLU/RSV testing.  Fact Sheet for Patients: EntrepreneurPulse.com.au  Fact Sheet for Healthcare Providers: IncredibleEmployment.be  This test is not yet approved or cleared by  the [employer] and has been authorized for detection and/or diagnosis of SARS-CoV-2 by FDA under an Emergency Use Authorization (EUA). This EUA will remain in effect (meaning this test can be used) for the duration of the COVID-19 declaration under Section 564(b)(1) of the Act, 21 U.S.C. section 360bbb-3(b)(1), unless the authorization is terminated or revoked.  Performed at [facility], [place] [address] [place], [place] [address]   Culture, blood (routine x 2)     Status: None (Preliminary result)   Collection Time: 08/13/21 12:30 AM   Specimen: BLOOD LEFT FOREARM  Result Value Ref Range Status   Specimen Description BLOOD LEFT FOREARM  Final   Special Requests   Final    BOTTLES DRAWN AEROBIC AND ANAEROBIC Blood Culture adequate volume   Culture   Final    NO GROWTH 2 DAYS Performed at [facility], [place] [address] [place], [place] [address]    Report Status PENDING   Incomplete  Culture, blood (routine x 2)     Status: None (Preliminary result)   Collection Time: 08/13/21 12:42 AM   Specimen: BLOOD RIGHT FOREARM  Result Value Ref Range Status   Specimen Description BLOOD RIGHT FOREARM  Final   Special Requests   Final    BOTTLES DRAWN AEROBIC AND ANAEROBIC Blood Culture adequate volume   Culture   Final    NO GROWTH 2 DAYS Performed at [facility], [place] [address] [place], [place] [address]    Report Status PENDING  Incomplete  Culture, blood (single)     Status: None (Preliminary result)   Collection Time: 08/13/21  5:58 AM   Specimen: BLOOD RIGHT HAND  Result Value Ref Range Status   Specimen Description BLOOD RIGHT HAND  Final   Special Requests AEROBIC BOTTLE ONLY Blood Culture adequate volume  Final   Culture   Final    NO GROWTH 2 DAYS Performed at [facility], [place] [address], [place], [place] [address]    Report Status PENDING  Incomplete  Urine Culture     Status: Abnormal   Collection Time: 08/14/21  6:20 AM   Specimen: Urine, Clean Catch  Result Value Ref Range Status   Specimen Description URINE, CLEAN CATCH  Final   Special Requests   Final    NONE Performed at [facility], [place] [address], [place], [place] [address]    Culture MULTIPLE SPECIES PRESENT, SUGGEST RECOLLECTION (A)  Final   Report Status 08/15/2021 FINAL  Final    Radiology Studies: No results found.    Sheva Mcdougle T. [place]  If 7PM-7AM, please contact night-coverage [email or web address] 08/15/2021, 2:29 PM

## 2021-08-15 NOTE — Progress Notes (Signed)
[facility] night cross cover note:  I was contacted by patient's RN regarding the following: Patient reporting overnight development of swelling in the bilateral lower extremities, with RN's physical exam confirming the presence of nonpitting edema, which appears to represent a new finding.  The patient is here for pyelonephritis, and has been receiving continuous normal saline at 100 cc/h.  He is currently normotensive, HR 's 70's - 80's, and maintaining oxygen saturations of 99 to 100% on room air without any reported overt respiratory distress.  Will discontinue IV fluids at this time, pending further volume assessment anticipated to occur via dayteam, including assessment of interval response to discontinuation of these IV fluids.  Additionally, I was notified of the following laboratory findings per this morning's labs: Potassium 3.4, creatinine 1.1, and serum magnesium 1.5.  I have ordered potassium chloride 40 mEq PO. x1 dose now and magnesium sulfate 2 g IV over 2 hours x1 dose now.     Babs Bertin, DO Hospitalist

## 2021-08-16 ENCOUNTER — Inpatient Hospital Stay (HOSPITAL_COMMUNITY): Payer: Medicare Other

## 2021-08-16 DIAGNOSIS — I10 Essential (primary) hypertension: Secondary | ICD-10-CM

## 2021-08-16 DIAGNOSIS — N12 Tubulo-interstitial nephritis, not specified as acute or chronic: Secondary | ICD-10-CM | POA: Diagnosis not present

## 2021-08-16 LAB — RENAL FUNCTION PANEL
Albumin: 1.9 g/dL — ABNORMAL LOW (ref 3.5–5.0)
Anion gap: 8 (ref 5–15)
BUN: 11 mg/dL (ref 8–23)
CO2: 22 mmol/L (ref 22–32)
Calcium: 8.2 mg/dL — ABNORMAL LOW (ref 8.9–10.3)
Chloride: 107 mmol/L (ref 98–111)
Creatinine, Ser: 0.89 mg/dL (ref 0.61–1.24)
GFR, Estimated: >60 mL/min (ref >60)
Glucose, Bld: 102 mg/dL — ABNORMAL HIGH (ref 70–99)
Phosphorus: 2.6 mg/dL (ref 2.5–4.6)
Potassium: 3.7 mmol/L (ref 3.5–5.1)
Sodium: 137 mmol/L (ref 135–145)

## 2021-08-16 LAB — CBC
HCT: 31.1 % — ABNORMAL LOW (ref 39.0–52.0)
Hemoglobin: 10.5 g/dL — ABNORMAL LOW (ref 13.0–17.0)
MCH: 28.2 pg (ref 26.0–34.0)
MCHC: 33.8 g/dL (ref 30.0–36.0)
MCV: 83.4 fL (ref 80.0–100.0)
Platelets: 278 10*3/uL (ref 150–400)
RBC: 3.73 MIL/uL — ABNORMAL LOW (ref 4.22–5.81)
RDW: 14.4 % (ref 11.5–15.5)
WBC: 5.6 10*3/uL (ref 4.0–10.5)
nRBC: 0 % (ref 0.0–0.2)

## 2021-08-16 LAB — ECHOCARDIOGRAM COMPLETE
AR max vel: 4.68 cm2
AV Peak grad: 6.6 mmHg
Ao pk vel: 1.28 m/s
Area-P 1/2: 3.74 cm2
Calc EF: 56 %
Height: 75 in
S' Lateral: 4.1 cm
Single Plane A2C EF: 54.3 %
Single Plane A4C EF: 56.8 %
Weight: 3680 oz

## 2021-08-16 LAB — MAGNESIUM: Magnesium: 1.7 mg/dL (ref 1.7–2.4)

## 2021-08-16 NOTE — Care Management Important Message (Signed)
Important Message  Patient Details  Name: Brian Mccullough MRN: [id number] Date of Birth: 01-13-1951   Medicare Important Message Given:  Yes     Orbie Pyo 08/16/2021, 2:43 PM

## 2021-08-16 NOTE — Progress Notes (Signed)
Patient's daughter in law, Eryck Negron would like someone from the primary, surgery and urology team to follow up with her tomorrow regarding the outpatient plans for Mr. Umar.  Her phone number is [phone].

## 2021-08-16 NOTE — Progress Notes (Signed)
PROGRESS NOTE  Brian Mccullough JOA:[id number] DOB: 07/11/1951 DOA: 08/12/2021 PCP: Cassandria Anger, MD  Brief History   70 year old M with PMH of CAD/CABG in 2021, DM-2, HTN, HLD and BPH presenting with fever, dysuria, urinary hesitancy, debris-filled urine and right flank pain and admitted with pyelonephritis with colovesical fistula.  Cultures obtained.  He was started on IV Zosyn.  Urology and general surgery consulted, and recommended antibiotics with outpatient follow-up.  Blood cultures NGTD.  Urine culture with multiple species.  Repeat urine culture pending.  Consultants  General Surgery Urology  Procedures  None  Antibiotics   Anti-infectives (From admission, onward)    Start     Dose/Rate Route Frequency Ordered Stop   08/13/21 0600  piperacillin-tazobactam (ZOSYN) IVPB 3.375 g        3.375 g 12.5 mL/hr over 240 Minutes Intravenous Every 8 hours 08/13/21 0141     08/13/21 0000  piperacillin-tazobactam (ZOSYN) IVPB 3.375 g        3.375 g 100 mL/hr over 30 Minutes Intravenous  Once 08/12/21 2347 08/13/21 0129      Subjective  The patient is resting comfortably. No new complaints.  Objective   Vitals:  Vitals:   08/16/21 0454 08/16/21 1307  BP: 137/71 140/66  Pulse: (!) 56 (!) 46  Resp: 14   Temp: 99.3 F (37.4 C) 98.6 F (37 C)  SpO2: 100% 100%    Exam:  Constitutional:  The patient is awake, alert, and oriented x 3. No acute distress. Respiratory:  CTA bilaterally, no w/r/r.  Respiratory effort normal. No retractions or accessory muscle use Cardiovascular:  RRR, no m/r/g No LE extremity edema   Normal pedal pulses Abdomen:  Abdomen appears normal; no tenderness or masses No hernias No HSM Musculoskeletal:  Digits/nails BUE: no clubbing, cyanosis, petechiae, infection exam of joints, bones, muscles of at least one of following: head/neck, RUE, LUE, RLE, LLE   strength and tone normal, no atrophy, no abnormal movements No tenderness,  masses Normal ROM, no contractures  gait and station Skin:  No rashes, lesions, ulcers palpation of skin: no induration or nodules Neurologic:  CN 2-12 intact Sensation all 4 extremities intact Psychiatric:  Mental status Mood, affect appropriate Orientation to person, place, time  judgment and insight appear intact     I have personally reviewed the following:   Today's Data  Vitals  Lab Data  CBC, BMP  Micro Data  Urine culture: reincubated for better growth Blood cultures x 2: No growth x 3 days  Imaging  CT renal stone study CXR  Cardiology Data  EKG Echocardiogram  Scheduled Meds:  (feeding supplement) PROSource Plus  30 mL Oral Daily   aspirin EC  81 mg Oral Daily   enoxaparin (LOVENOX) injection  40 mg Subcutaneous Q24H   feeding supplement  237 mL Oral BID BM   influenza vaccine adjuvanted  0.5 mL Intramuscular Tomorrow-1000   metFORMIN  500 mg Oral Q breakfast   metoprolol tartrate  12.5 mg Oral BID   multivitamin with minerals  1 tablet Oral Daily   phenazopyridine  200 mg Oral TID WC   tamsulosin  0.4 mg Oral Daily   cyanocobalamin  1,000 mcg Oral Daily   Continuous Infusions:  piperacillin-tazobactam (ZOSYN)  IV 3.375 g (08/16/21 1438)    Principal Problem:   Pyelonephritis Active Problems:   Diabetes mellitus type 2, controlled (HCC)   HTN (hypertension)   S/P CABG x 6   BPH (benign prostatic hyperplasia)  AKI (acute kidney injury) ([place])   Hyponatremia   LOS: 3 days   A & P  Severe sepsis due to acute right pyelonephritis complicated by colovesical fistula: POA.  Was febrile with tachycardia and AKI POA.  Patient continues to endorse urinary frequency, dysuria and decreasing urine.  -Continue IV Zosyn pending repeat urine culture -Discussed with Dr. Grandville Silos and Dr. 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 -Plan for IV  antibiotics and outpatient follow-up with urology and colorectal surgery -Urology recommended PVR, Flomax, Azo and suppressive antibiotics after initial treatment.   AKI/azotemia: Resolved. Recent Labs (within last 365 days)          Recent Labs    10/19/20 1023 03/29/21 0946 04/26/21 0926 08/12/21 1330 08/13/21 0558 08/15/21 0142  BUN 13 13 11 20  26* 22  CREATININE 1.10 1.17 0.97 1.42* 1.36* 1.10    -IV fluid discontinued   Hyponatremia: Improving. Last Labs        Recent Labs  Lab 08/12/21 1330 08/13/21 0558 08/15/21 0142  NA 130* 132* 133*    -Monitor off IV fluid.   History of CAD/CABG in 07/2020.  No anginal symptoms. -Followed by cardiology for statin intolerance -Plavix discontinued.  Per patient, plan to end on 11/1.   Controlled NIDDM-2: A1c <6.0% since 2012.  On low-dose metformin at home.  BMP glucose within appropriate range -Continue home metformin   Hypotension with history of essential hypertension: Hypotension seems to have resolved. -Discontinue IV fluid -Discontinued benazepril. -Continue home metoprolol   BPH: Patient stopped taking his Flomax due to dizziness -Continue home Flomax -Recommend taking this at night   Trace edema: Likely due to IV fluid. -IV fluid discontinued.   Hypokalemia/hypomagnesemia -Replenish and recheck.   COVID-19 vaccination: Patient is vaccinated including booster but likes to get the newer booster. -Booster vaccine ordered.   Increased nutrient needs Body mass index is 28.75 kg/m. Nutrition Problem: Increased nutrient needs Etiology: acute illness Signs/Symptoms: estimated needs Interventions: Prostat, MVI, Ensure Enlive (each supplement provides 350kcal and 20 grams of protein) DVT prophylaxis:  enoxaparin (LOVENOX) injection 40 mg Start: 08/13/21 1000   Code Status: Full code Family Communication: Updated patient's daughter-in-law, Velna Hatchet over the phone at patient's request. Level of care:  Med-Surg Status is: Inpatient   Remains inpatient appropriate because: Need for IV antibiotics for pyelonephritis and complicated UTI pending urine cultures   Charels Stambaugh, DO [facility] Direct contact: see [email or web address]  7PM-7AM contact night coverage as above 08/16/2021, 6:40 PM  LOS: 3 days

## 2021-08-17 DIAGNOSIS — N179 Acute kidney failure, unspecified: Secondary | ICD-10-CM

## 2021-08-17 DIAGNOSIS — N12 Tubulo-interstitial nephritis, not specified as acute or chronic: Secondary | ICD-10-CM

## 2021-08-17 DIAGNOSIS — R350 Frequency of micturition: Secondary | ICD-10-CM

## 2021-08-17 DIAGNOSIS — N401 Enlarged prostate with lower urinary tract symptoms: Secondary | ICD-10-CM | POA: Diagnosis not present

## 2021-08-17 DIAGNOSIS — A4152 Sepsis due to Pseudomonas: Secondary | ICD-10-CM

## 2021-08-17 DIAGNOSIS — E1165 Type 2 diabetes mellitus with hyperglycemia: Secondary | ICD-10-CM | POA: Diagnosis not present

## 2021-08-17 LAB — URINE CULTURE: Culture: 80000 — AB

## 2021-08-17 MED ORDER — LEVOFLOXACIN 500 MG PO TABS: 250.0000 mg | ORAL_TABLET | Freq: Every day | ORAL | Status: DC

## 2021-08-17 MED ORDER — LEVOFLOXACIN 500 MG PO TABS
500.0000 mg | ORAL_TABLET | Freq: Every day | ORAL | Status: DC
  Administered 2021-08-17 – 2021-08-19 (×3): 500 mg via ORAL
  Filled 2021-08-17 (×3): qty 1

## 2021-08-17 MED ORDER — LEVOFLOXACIN 250 MG PO TABS: ORAL_TABLET | ORAL | 0 refills | Status: AC

## 2021-08-17 MED ORDER — HYDRALAZINE HCL 25 MG PO TABS
25.0000 mg | ORAL_TABLET | Freq: Three times a day (TID) | ORAL | Status: DC
  Administered 2021-08-17 – 2021-08-19 (×6): 25 mg via ORAL
  Filled 2021-08-17 (×6): qty 1

## 2021-08-17 NOTE — Consult Note (Signed)
[facility]    Date of Admission:  08/12/2021     Total days of antibiotics 4   Zosyn 11/04 >> current              Reason for Consult: Pyelonephritis, colovesical fistula     Referring Provider: Swayze  Primary Care Provider: Cassandria Anger, MD    Assessment: Brian Mccullough is a 70 y.o. male admitted with 6 week history of intermittent cystitis symptoms with acutely worsening back pain and fevers - found to have acute pyelonephritis and colovesical fistula noted.  Pseudomonas growing from urine culture.  He has had improvement on zosyn after 5 days and defervesced, but has not completely resolved urinary symptoms; will plan to switch to oral levaquin 500 mg QD to complete 10-day course for acute pyelo. Will need surgical attention to correct the underlying problem and prevent recurrent infection; would recommend surgical team to continue his levaquin 250 mg daily for suppression until definitive surgery.   Regarding perioperative antibiotics for procedure - I think it would be best to use cefepime.  Zosyn may not be reliable with higher range MIC to pseudomonas and suppressive levaquin makes him at risk for quinolone resistant bacteria.     Plan: Will switch to levaquin 500 mg QD through 11/14 Then recommend surgery team to continue him on levaquin 250 mg QD for suppression until surgery plan arranged.  Perioperative antibiotics for GI/GU procedure - would recommend to use cefepime      Principal Problem:   Pyelonephritis Active Problems:   Diabetes mellitus type 2, controlled (HCC)   HTN (hypertension)   S/P CABG x 6   BPH (benign prostatic hyperplasia)   AKI (acute kidney injury) (HCC)   Hyponatremia    (feeding supplement) PROSource Plus  30 mL Oral Daily   aspirin EC  81 mg Oral Daily   enoxaparin (LOVENOX) injection  40 mg Subcutaneous Q24H   feeding supplement  237 mL Oral BID BM   influenza vaccine adjuvanted  0.5 mL  Intramuscular Tomorrow-1000   metFORMIN  500 mg Oral Q breakfast   metoprolol tartrate  12.5 mg Oral BID   multivitamin with minerals  1 tablet Oral Daily   phenazopyridine  200 mg Oral TID WC   tamsulosin  0.4 mg Oral Daily   cyanocobalamin  1,000 mcg Oral Daily    HPI: Brian Mccullough is a 70 y.o. male admitted from home with back pain.   He tells me that he has been experiencing dysuria / cystitis symptoms for nearly 6 weeks prior to admission. No measured fevers, some intermittent chills on occasion. No weakness. Normal appetite. Found to have pyelonephritis with urine culture growing pseudomonas (sensitive). Also found to have colovesical fistula on CT scan. General surgery has seen - recommended outpatient follow up for surgical planning with colorectal specialists as we treat current infection.   No previous history of surgeries on GI/GU system. Previous colonoscopies with benign polyps - he says he is overdue for next recommended study.  Last UTI was about 10 years ago from his recollection.  Had a CABG x 6 following STEMI in 07/2020 with radial artery graft - healed well with this. He has HTN and is on metformin for prediabetes (last A1C 5.9%).   He says he is about 95% better regarding dysuria vs when he came here on Friday. Back pain has resolved.    Review of Systems: Review of  Systems  Constitutional:  Negative for chills, fever and malaise/fatigue.  Respiratory: Negative.    Cardiovascular: Negative.  Negative for chest pain.  Gastrointestinal:  Negative for abdominal pain, diarrhea, nausea and vomiting.  Genitourinary:  Positive for dysuria (improved but not completely resolved.). Negative for flank pain.  Skin:  Negative for rash.  Neurological:  Negative for weakness.   Past Medical History:  Diagnosis Date   Allergy    CAD (coronary artery disease)    s/p Inf STEMI 10/21>>POBA to RCA >> s/p CABG (L-LAD, S-PDA, L Radial-OM1/RI, RIMA-RPLA) // post op AF; SVT >> Amiod  Rx // Echo 10/21: EF 55-60, no RWMA, GLS -18.5, normal RVSF, mild LAE, trivial MR    Carotid artery disease ([place])    [place] 10/21: R 1-39; L 40-59   Hyperactive gag reflex    Hypertension    under control with meds., has been on med. x 30 yr.   Inguinal hernia 01/2014   bilateral   LBP (low back pain)    Non-insulin dependent type 2 diabetes mellitus (HCC)    OA (osteoarthritis) of knee 01/2004   left   Palpitations    s/p Inf STEMI 07/2020   POBA to RCA >> CABG   Vitamin B 12 deficiency     Social History   Tobacco Use   Smoking status: Former    Types: Cigars    Quit date: 08/03/2020    Years since quitting: 1.0   Smokeless tobacco: Never   Tobacco comments:    2-3 small cigars/day  Vaping Use   Vaping Use: Never used  Substance Use Topics   Alcohol use: No   Drug use: No    Family History  Problem Relation Age of Onset   Hypertension Mother    Diabetes Mother    Diabetes Father    Colon cancer Neg Hx    Esophageal cancer Neg Hx    Rectal cancer Neg Hx    Stomach cancer Neg Hx    Allergies  Allergen Reactions   Codeine Nausea Only    OBJECTIVE: Blood pressure (!) 153/90, pulse 93, temperature 99.3 F (37.4 C), temperature source Oral, resp. rate 16, height 6\' 3"  (1.905 m), weight 104.3 kg, SpO2 100 %.  Physical Exam Vitals and nursing note reviewed.  Constitutional:      Appearance: Normal appearance. He is not ill-appearing.     Comments: Resting in bed quietly.   HENT:     Mouth/Throat:     Mouth: Mucous membranes are moist.     Pharynx: Oropharynx is clear.  Eyes:     General: No scleral icterus. Cardiovascular:     Rate and Rhythm: Normal rate and regular rhythm.  Pulmonary:     Effort: Pulmonary effort is normal. No respiratory distress.  Abdominal:     General: There is no distension.  Skin:    General: Skin is warm and dry.     Capillary Refill: Capillary refill takes less than 2 seconds.  Neurological:     Mental Status: He is alert and  oriented to person, place, and time.    Lab Results Lab Results  Component Value Date   WBC 5.6 08/16/2021   HGB 10.5 (L) 08/16/2021   HCT 31.1 (L) 08/16/2021   MCV 83.4 08/16/2021   PLT 278 08/16/2021    Lab Results  Component Value Date   CREATININE 0.89 08/16/2021   BUN 11 08/16/2021   NA 137 08/16/2021   K 3.7 08/16/2021  CL 107 08/16/2021   CO2 22 08/16/2021    Lab Results  Component Value Date   ALT 20 08/12/2021   AST 31 08/12/2021   ALKPHOS 70 08/12/2021   BILITOT 2.0 (H) 08/12/2021     Microbiology: Recent Results (from the past 240 hour(s))  Resp Panel by RT-PCR (Flu A&B, Covid) Nasopharyngeal Swab     Status: None   Collection Time: 08/12/21  1:31 PM   Specimen: Nasopharyngeal Swab; Nasopharyngeal(NP) swabs in vial transport medium  Result Value Ref Range Status   SARS Coronavirus 2 by RT PCR NEGATIVE NEGATIVE Final    Comment: (NOTE) SARS-CoV-2 target nucleic acids are NOT DETECTED.  The SARS-CoV-2 RNA is generally detectable in upper respiratory specimens during the acute phase of infection. The lowest concentration of SARS-CoV-2 viral copies this assay can detect is 138 copies/mL. A negative result does not preclude SARS-Cov-2 infection and should not be used as the sole basis for treatment or other patient management decisions. A negative result may occur with  improper specimen collection/handling, submission of specimen other than nasopharyngeal swab, presence of viral mutation(s) within the areas targeted by this assay, and inadequate number of viral copies(<138 copies/mL). A negative result must be combined with clinical observations, patient history, and epidemiological information. The expected result is Negative.  Fact Sheet for Patients:  EntrepreneurPulse.com.au  Fact Sheet for Healthcare Providers:  IncredibleEmployment.be  This test is no t yet approved or cleared by the [place] FDA and  has  been authorized for detection and/or diagnosis of SARS-CoV-2 by FDA under an Emergency Use Authorization (EUA). This EUA will remain  in effect (meaning this test can be used) for the duration of the COVID-19 declaration under Section 564(b)(1) of the Act, 21 U.S.C.section 360bbb-3(b)(1), unless the authorization is terminated  or revoked sooner.       Influenza A by PCR NEGATIVE NEGATIVE Final   Influenza B by PCR NEGATIVE NEGATIVE Final    Comment: (NOTE) The Xpert Xpress SARS-CoV-2/FLU/RSV plus assay is intended as an aid in the diagnosis of influenza from Nasopharyngeal swab specimens and should not be used as a sole basis for treatment. Nasal washings and aspirates are unacceptable for Xpert Xpress SARS-CoV-2/FLU/RSV testing.  Fact Sheet for Patients: EntrepreneurPulse.com.au  Fact Sheet for Healthcare Providers: IncredibleEmployment.be  This test is not yet approved or cleared by the [place] FDA and has been authorized for detection and/or diagnosis of SARS-CoV-2 by FDA under an Emergency Use Authorization (EUA). This EUA will remain in effect (meaning this test can be used) for the duration of the COVID-19 declaration under Section 564(b)(1) of the Act, 21 U.S.C. section 360bbb-3(b)(1), unless the authorization is terminated or revoked.  Performed at [facility], [place] [address] [place], [place] [address]   Culture, blood (routine x 2)     Status: None (Preliminary result)   Collection Time: 08/13/21 12:30 AM   Specimen: BLOOD LEFT FOREARM  Result Value Ref Range Status   Specimen Description BLOOD LEFT FOREARM  Final   Special Requests   Final    BOTTLES DRAWN AEROBIC AND ANAEROBIC Blood Culture adequate volume   Culture   Final    NO GROWTH 4 DAYS Performed at [facility], [place] [address] [place], [place] [address]    Report Status PENDING  Incomplete  Culture, blood (routine x 2)     Status: None  (Preliminary result)   Collection Time: 08/13/21 12:42 AM   Specimen: BLOOD RIGHT FOREARM  Result Value Ref Range Status   Specimen Description BLOOD RIGHT FOREARM  Final   Special Requests   Final    BOTTLES DRAWN AEROBIC AND ANAEROBIC Blood Culture adequate volume   Culture   Final    NO GROWTH 4 DAYS Performed at [facility], [address], [place], [place] [address]    Report Status PENDING  Incomplete  Culture, blood (single)     Status: None (Preliminary result)   Collection Time: 08/13/21  5:58 AM   Specimen: BLOOD RIGHT HAND  Result Value Ref Range Status   Specimen Description BLOOD RIGHT HAND  Final   Special Requests AEROBIC BOTTLE ONLY Blood Culture adequate volume  Final   Culture   Final    NO GROWTH 4 DAYS Performed at [facility], [place] [address] [place], [place] [address]    Report Status PENDING  Incomplete  Urine Culture     Status: Abnormal   Collection Time: 08/14/21  6:20 AM   Specimen: Urine, Clean Catch  Result Value Ref Range Status   Specimen Description URINE, CLEAN CATCH  Final   Special Requests   Final    NONE Performed at [facility], [place] [address], [place],  [address]    Culture MULTIPLE SPECIES PRESENT, SUGGEST RECOLLECTION (A)  Final   Report Status 08/15/2021 FINAL  Final  Urine Culture     Status: Abnormal   Collection Time: 08/15/21  9:57 AM   Specimen: Urine, Clean Catch  Result Value Ref Range Status   Specimen Description URINE, CLEAN CATCH  Final   Special Requests   Final    NONE Performed at [facility], [place] [address], [place], [place] [address]    Culture 80,000 COLONIES/mL PSEUDOMONAS AERUGINOSA (A)  Final   Report Status 08/17/2021 FINAL  Final   Organism ID, Bacteria PSEUDOMONAS AERUGINOSA (A)  Final      Susceptibility   Pseudomonas aeruginosa - MIC*    CEFTAZIDIME 4 SENSITIVE Sensitive     CIPROFLOXACIN <=0.25 SENSITIVE Sensitive     GENTAMICIN 2 SENSITIVE Sensitive     IMIPENEM  <=0.25 SENSITIVE Sensitive     PIP/TAZO 16 SENSITIVE Sensitive     CEFEPIME 0.5 SENSITIVE Sensitive     * 80,000 COLONIES/mL PSEUDOMONAS AERUGINOSA    Janene Madeira, MSN, NP-C [facility] Pager: [phone]  08/17/2021 10:36 AM

## 2021-08-17 NOTE — Progress Notes (Signed)
   08/17/21 2053  Assess: MEWS Score  Temp (!) 100.5 F (38.1 C)  BP (!) 148/70  Pulse Rate (!) 109  Resp 19  Level of Consciousness Alert  Assess: MEWS Score  MEWS Temp 1  MEWS Systolic 0  MEWS Pulse 1  MEWS RR 0  MEWS LOC 0  MEWS Score 2  MEWS Score Color Yellow  Assess: if the MEWS score is Yellow or Red  Were vital signs taken at a resting state? Yes  Focused Assessment No change from prior assessment  Early Detection of Sepsis Score *See Row Information* Medium  MEWS guidelines implemented *See Row Information* Yes  Treat  MEWS Interventions Administered scheduled meds/treatments;Administered prn meds/treatments  Pain Scale 0-10  Pain Score 0  Take Vital Signs  Increase Vital Sign Frequency  Yellow: Q 2hr X 2 then Q 4hr X 2, if remains yellow, continue Q 4hrs  Escalate  MEWS: Escalate Yellow: discuss with charge nurse/RN and consider discussing with provider and RRT  Notify: Charge Nurse/RN  Name of Charge Nurse/RN Notified Paige,RN  Date Charge Nurse/RN Notified 08/17/21  Time Charge Nurse/RN Notified 2115  Notify: Provider  Provider Name/Title Jamison Neighbor  Date Provider Notified 08/17/21  Time Provider Notified 2130  Notification Type Page (secure chat secondary)  Notification Reason Change in status  Provider response No new orders  Date of Provider Response 08/17/21  Time of Provider Response 2135  Document  Patient Outcome Stabilized after interventions  Progress note created (see row info) Yes

## 2021-08-17 NOTE — Progress Notes (Signed)
PROGRESS NOTE  Brian Mccullough CVE:[id number] DOB: [birth year] DOA: 08/12/2021 PCP: Cassandria Anger, MD  Brief History   70 year old M with PMH of CAD/CABG in 2021, DM-2, HTN, HLD and BPH presenting with fever, dysuria, urinary hesitancy, debris-filled urine and right flank pain and admitted with pyelonephritis with colovesical fistula.  Cultures obtained.  He was started on IV Zosyn.  Urology and general surgery consulted, and recommended antibiotics with outpatient follow-up.  Blood cultures NGTD.  Urine culture with multiple species.  Repeat urine culture pending.  The patient has been receiving Zosyn IV since 08/13/2021. Infectious disease was consulted today. They have changed the patient to oral levaquin. They have recommended 500 mg daily through 08/22/2021 for ongoing treatment of the pyelonephritis. He should then take Levaquin 250 mg daily by mouth for suppression until the surgery is completed.   The patient will follow up with surgery as outpatient.   Consultants  General Surgery Urology Infectious disease  Procedures  None  Antibiotics   Anti-infectives (From admission, onward)    Start     Dose/Rate Route Frequency Ordered Stop   08/23/21 1000  levofloxacin (LEVAQUIN) tablet 250 mg        250 mg Oral Daily 08/17/21 1100     08/18/21 0000  levofloxacin (LEVAQUIN) 250 MG tablet         Oral  08/17/21 1102 09/17/21 2359   08/17/21 1400  levofloxacin (LEVAQUIN) tablet 500 mg        500 mg Oral Daily 08/17/21 1058 08/22/21 2359   08/13/21 0600  piperacillin-tazobactam (ZOSYN) IVPB 3.375 g  Status:  Discontinued        3.375 g 12.5 mL/hr over 240 Minutes Intravenous Every 8 hours 08/13/21 0141 08/17/21 1058   08/13/21 0000  piperacillin-tazobactam (ZOSYN) IVPB 3.375 g        3.375 g 100 mL/hr over 30 Minutes Intravenous  Once 08/12/21 2347 08/13/21 0129      Subjective  The patient is resting comfortably. No new complaints.  Objective   Vitals:  Vitals:    08/17/21 0437 08/17/21 0757  BP: (!) 147/76 (!) 153/90  Pulse: (!) 58 93  Resp: 18 16  Temp: 98.2 F (36.8 C) 99.3 F (37.4 C)  SpO2: 100% 100%    Exam:  Constitutional:  The patient is awake, alert, and oriented x 3. No acute distress. Respiratory:  CTA bilaterally, no w/r/r.  Respiratory effort normal. No retractions or accessory muscle use Cardiovascular:  RRR, no m/r/g No LE extremity edema   Normal pedal pulses Abdomen:  Abdomen appears normal; no tenderness or masses No hernias No HSM Musculoskeletal:  Digits/nails BUE: no clubbing, cyanosis, petechiae, infection exam of joints, bones, muscles of at least one of following: head/neck, RUE, LUE, RLE, LLE   strength and tone normal, no atrophy, no abnormal movements No tenderness, masses Normal ROM, no contractures  gait and station Skin:  No rashes, lesions, ulcers palpation of skin: no induration or nodules Neurologic:  CN 2-12 intact Sensation all 4 extremities intact Psychiatric:  Mental status Mood, affect appropriate Orientation to person, place, time  judgment and insight appear intact    I have personally reviewed the following:   Today's Data  Vitals  Lab Data  CBC, BMP  Micro Data  Urine culture: reincubated for better growth Blood cultures x 2: No growth x 3 days  Imaging  CT renal stone study CXR  Cardiology Data  EKG Echocardiogram  Scheduled Meds:  (feeding supplement) PROSource Plus  30 mL Oral Daily   aspirin EC  81 mg Oral Daily   enoxaparin (LOVENOX) injection  40 mg Subcutaneous Q24H   feeding supplement  237 mL Oral BID BM   influenza vaccine adjuvanted  0.5 mL Intramuscular Tomorrow-1000   [START ON 08/23/2021] levofloxacin  250 mg Oral Daily   levofloxacin  500 mg Oral Daily   metFORMIN  500 mg Oral Q breakfast   metoprolol tartrate  12.5 mg Oral BID   multivitamin with minerals  1 tablet Oral Daily   tamsulosin  0.4 mg Oral Daily   cyanocobalamin  1,000 mcg Oral  Daily   Continuous Infusions:    Principal Problem:   Pyelonephritis Active Problems:   Diabetes mellitus type 2, controlled ([place])   HTN (hypertension)   S/P CABG x 6   BPH (benign prostatic hyperplasia)   AKI (acute kidney injury) ([place])   Hyponatremia   LOS: 4 days   A & P  Severe sepsis due to acute right pyelonephritis complicated by colovesical fistula: POA.  Was febrile with tachycardia and AKI POA.  Patient continues to endorse urinary frequency, dysuria and decreasing urine.  -Continue IV Zosyn pending repeat urine culture -Discussed with Dr. Grandville Silos and Dr. Alyson Ingles on 11/7-recommended Flomax and Azo - I appreciate infectious disease consult. The patient has been changed to oral levaquin 500 mg daily that he should continue through 08/22/2021. He should then switch to Levaquin 250 mg daily until the surgery is performed to address his fistula.    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 -Plan for IV antibiotics and outpatient follow-up with urology and colorectal surgery -Urology recommended PVR, Flomax, Azo and suppressive antibiotics after initial treatment.   AKI/azotemia: Resolved. Recent Labs (within last 365 days)          Recent Labs    10/19/20 1023 03/29/21 0946 04/26/21 0926 08/12/21 1330 08/13/21 0558 08/15/21 0142  BUN 13 13 11 20  26* 22  CREATININE 1.10 1.17 0.97 1.42* 1.36* 1.10    -IV fluid discontinued   Hyponatremia: Resolved. Last Labs        Recent Labs  Lab 08/12/21 1330 08/13/21 0558 08/15/21 0142  NA 130* 132* 133*    -Monitor off IV fluid.   History of CAD/CABG in 07/2020.  No anginal symptoms. -Followed by cardiology for statin intolerance -Plavix discontinued.  Per patient, plan to end on 11/1.   Controlled NIDDM-2: A1c <6.0% since 2012.  On low-dose metformin at home.  BMP glucose within appropriate range -Continue home metformin   Hypotension with history  of essential hypertension: Hypotension seems to have resolved. Pt is now somewhat hypertensive. Will restart benazepril.  -Discontinue IV fluid -Will restart benazepril. -Continue home metoprolol   BPH: Patient stopped taking his Flomax due to dizziness -Continue home Flomax -Recommend taking this at night   Trace edema: Likely due to IV fluid. -IV fluid discontinued.   Hypokalemia/hypomagnesemia -Supplement and monitor   COVID-19 vaccination: Patient is vaccinated including booster but likes to get the newer booster. -Booster vaccine ordered.   Increased nutrient needs Body mass index is 28.75 kg/m. Nutrition Problem: Increased nutrient needs Etiology: acute illness Signs/Symptoms: estimated needs Interventions: Prostat, MVI, Ensure Enlive (each supplement provides 350kcal and 20 grams of protein) DVT prophylaxis:  enoxaparin (LOVENOX) injection 40 mg Start: 08/13/21 1000   Code Status: Full code Family Communication: Updated patient's daughter-in-law, Velna Hatchet over the phone at patient's request. Level of care: Med-Surg Status is: Inpatient  Remains inpatient appropriate because: Need for IV antibiotics for pyelonephritis and complicated UTI pending urine cultures   Jennifr Gaeta, DO [facility] Direct contact: see [email or web address]  7PM-7AM contact night coverage as above 08/17/2021, 3:42 PM  LOS: 3 days

## 2021-08-17 NOTE — Progress Notes (Signed)
Pt used bathroom; did not save urine

## 2021-08-18 DIAGNOSIS — N12 Tubulo-interstitial nephritis, not specified as acute or chronic: Secondary | ICD-10-CM | POA: Diagnosis not present

## 2021-08-18 LAB — BASIC METABOLIC PANEL
Anion gap: 6 (ref 5–15)
BUN: 8 mg/dL (ref 8–23)
CO2: 25 mmol/L (ref 22–32)
Calcium: 8.2 mg/dL — ABNORMAL LOW (ref 8.9–10.3)
Chloride: 106 mmol/L (ref 98–111)
Creatinine, Ser: 0.95 mg/dL (ref 0.61–1.24)
GFR, Estimated: >60 mL/min (ref >60)
Glucose, Bld: 104 mg/dL — ABNORMAL HIGH (ref 70–99)
Potassium: 3.7 mmol/L (ref 3.5–5.1)
Sodium: 137 mmol/L (ref 135–145)

## 2021-08-18 LAB — CBC WITH DIFFERENTIAL/PLATELET
Abs Immature Granulocytes: 0.09 10*3/uL — ABNORMAL HIGH (ref 0.00–0.07)
Basophils Absolute: 0 10*3/uL (ref 0.0–0.1)
Basophils Relative: 1 %
Eosinophils Absolute: 0.1 10*3/uL (ref 0.0–0.5)
Eosinophils Relative: 3 %
HCT: 32.2 % — ABNORMAL LOW (ref 39.0–52.0)
Hemoglobin: 10.4 g/dL — ABNORMAL LOW (ref 13.0–17.0)
Immature Granulocytes: 2 %
Lymphocytes Relative: 23 %
Lymphs Abs: 1 10*3/uL (ref 0.7–4.0)
MCH: 27.6 pg (ref 26.0–34.0)
MCHC: 32.3 g/dL (ref 30.0–36.0)
MCV: 85.4 fL (ref 80.0–100.0)
Monocytes Absolute: 0.7 10*3/uL (ref 0.1–1.0)
Monocytes Relative: 15 %
Neutro Abs: 2.5 10*3/uL (ref 1.7–7.7)
Neutrophils Relative %: 56 %
Platelets: 316 10*3/uL (ref 150–400)
RBC: 3.77 MIL/uL — ABNORMAL LOW (ref 4.22–5.81)
RDW: 14.6 % (ref 11.5–15.5)
WBC: 4.5 10*3/uL (ref 4.0–10.5)
nRBC: 0 % (ref 0.0–0.2)

## 2021-08-18 LAB — CULTURE, BLOOD (ROUTINE X 2)
Culture: NO GROWTH
Culture: NO GROWTH
Special Requests: ADEQUATE
Special Requests: ADEQUATE

## 2021-08-18 LAB — CULTURE, BLOOD (SINGLE)
Culture: NO GROWTH
Special Requests: ADEQUATE

## 2021-08-18 NOTE — Plan of Care (Signed)
  Problem: Education: Goal: Knowledge of General Education information will improve Description Including pain rating scale, medication(s)/side effects and non-pharmacologic comfort measures Outcome: Progressing   Problem: Health Behavior/Discharge Planning: Goal: Ability to manage health-related needs will improve Outcome: Progressing   

## 2021-08-18 NOTE — Progress Notes (Signed)
PROGRESS NOTE  Brian Mccullough:[id number] DOB: 04/30/1951 DOA: 08/12/2021 PCP: Cassandria Anger, MD  Brief History   70 year old M with PMH of CAD/CABG in 2021, DM-2, HTN, HLD and BPH presenting with fever, dysuria, urinary hesitancy, debris-filled urine and right flank pain and admitted with pyelonephritis with colovesical fistula.  Cultures obtained.  He was started on IV Zosyn.  Urology and general surgery consulted, and recommended antibiotics with outpatient follow-up.  Blood cultures NGTD.  Urine culture with multiple species.  Repeat urine culture pending.  The patient has been receiving Zosyn IV since 08/13/2021. Infectious disease was consulted today. They have changed the patient to oral levaquin. They have recommended 500 mg daily through 08/22/2021 for ongoing treatment of the pyelonephritis. He should then take Levaquin 250 mg daily by mouth for suppression until the surgery is completed.   The patient will follow up with surgery as outpatient.   Monitor patient for fevers for an additional 24 hours due to fever last night of 100.9.  Consultants  General Surgery Urology Infectious disease  Procedures  None  Antibiotics   Anti-infectives (From admission, onward)    Start     Dose/Rate Route Frequency Ordered Stop   08/23/21 1000  levofloxacin (LEVAQUIN) tablet 250 mg        250 mg Oral Daily 08/17/21 1100     08/18/21 0000  levofloxacin (LEVAQUIN) 250 MG tablet         Oral  08/17/21 1102 09/17/21 2359   08/17/21 1400  levofloxacin (LEVAQUIN) tablet 500 mg        500 mg Oral Daily 08/17/21 1058 08/22/21 2359   08/13/21 0600  piperacillin-tazobactam (ZOSYN) IVPB 3.375 g  Status:  Discontinued        3.375 g 12.5 mL/hr over 240 Minutes Intravenous Every 8 hours 08/13/21 0141 08/17/21 1058   08/13/21 0000  piperacillin-tazobactam (ZOSYN) IVPB 3.375 g        3.375 g 100 mL/hr over 30 Minutes Intravenous  Once 08/12/21 2347 08/13/21 0129      Subjective  The  patient is resting comfortably. No new complaints.  Objective   Vitals:  Vitals:   08/18/21 1521 08/18/21 1640  BP: (!) 155/83 (!) 158/85  Pulse: 74 76  Resp: 18 16  Temp: 99.2 F (37.3 C) 99.5 F (37.5 C)  SpO2: 100% 100%    Exam:  Constitutional:  The patient is awake, alert, and oriented x 3. No acute distress. Respiratory:  CTA bilaterally, no w/r/r.  Respiratory effort normal. No retractions or accessory muscle use Cardiovascular:  RRR, no m/r/g No LE extremity edema   Normal pedal pulses Abdomen:  Abdomen appears normal; no tenderness or masses No hernias No HSM Musculoskeletal:  Digits/nails BUE: no clubbing, cyanosis, petechiae, infection Skin No rashes, lesions, ulcers palpation of skin: no induration or nodules Neurologic:  CN 2-12 intact Sensation all 4 extremities intact Psychiatric:  Mental status Mood, affect appropriate Orientation to person, place, time  judgment and insight appear intact    I have personally reviewed the following:   Today's Data  Vitals  Lab Data  CBC, BMP  Micro Data  Urine culture: reincubated for better growth Blood cultures x 2: No growth x 3 days  Imaging  CT renal stone study CXR  Cardiology Data  EKG Echocardiogram  Scheduled Meds:  (feeding supplement) PROSource Plus  30 mL Oral Daily   aspirin EC  81 mg Oral Daily   enoxaparin (LOVENOX) injection  40 mg Subcutaneous  Q24H   feeding supplement  237 mL Oral BID BM   hydrALAZINE  25 mg Oral Q8H   [START ON 08/23/2021] levofloxacin  250 mg Oral Daily   levofloxacin  500 mg Oral Daily   metFORMIN  500 mg Oral Q breakfast   metoprolol tartrate  12.5 mg Oral BID   multivitamin with minerals  1 tablet Oral Daily   tamsulosin  0.4 mg Oral Daily   cyanocobalamin  1,000 mcg Oral Daily    Principal Problem:   Pyelonephritis Active Problems:   Diabetes mellitus type 2, controlled ([place])   HTN (hypertension)   S/P CABG x 6   BPH (benign prostatic  hyperplasia)   AKI (acute kidney injury) ([place])   Hyponatremia   LOS: 5 days   A & P  Severe sepsis due to acute right pyelonephritis complicated by colovesical fistula: POA.  Was febrile with tachycardia and AKI POA.  Patient continues to endorse urinary frequency, dysuria and decreasing urine.  -Continue IV Zosyn pending repeat urine culture -Discussed with Dr. Grandville Silos and Dr. Alyson Ingles on 11/7-recommended Flomax and Azo - I appreciate infectious disease consult. The patient has been changed to oral levaquin 500 mg daily that he should continue through 08/22/2021. He should then switch to Levaquin 250 mg daily until the surgery is performed to address his fistula.  -the patient had a fever of 100.9 last night. Will hold patient for monitoring for one more day prior to discharge.   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 -Plan for IV antibiotics and outpatient follow-up with urology and colorectal surgery -Urology recommended PVR, Flomax, Azo and suppressive antibiotics after initial treatment.   AKI/azotemia: Resolved. Recent Labs (within last 365 days)          Recent Labs    10/19/20 1023 03/29/21 0946 04/26/21 0926 08/12/21 1330 08/13/21 0558 08/15/21 0142  BUN 13 13 11 20  26* 22  CREATININE 1.10 1.17 0.97 1.42* 1.36* 1.10    -IV fluid discontinued   Hyponatremia: Resolved. Last Labs        Recent Labs  Lab 08/12/21 1330 08/13/21 0558 08/15/21 0142  NA 130* 132* 133*    -Monitor off IV fluid.   History of CAD/CABG in 07/2020.  No anginal symptoms. -Followed by cardiology for statin intolerance -Plavix discontinued.  Per patient, plan to end on 11/1.   Controlled NIDDM-2: A1c <6.0% since 2012.  On low-dose metformin at home.  BMP glucose within appropriate range -Continue home metformin   Hypotension with history of essential hypertension: Hypotension seems to have resolved. Pt is now somewhat  hypertensive. Will restart benazepril.  -Discontinue IV fluid -Will restart benazepril. -Continue home metoprolol   BPH: Patient stopped taking his Flomax due to dizziness -Continue home Flomax -Recommend taking this at night   Trace edema: Likely due to IV fluid. -IV fluid discontinued.   Hypokalemia/hypomagnesemia -Supplement and monitor   COVID-19 vaccination: Patient is vaccinated including booster but likes to get the newer booster. -Booster vaccine ordered.   Increased nutrient needs Body mass index is 28.75 kg/m. Nutrition Problem: Increased nutrient needs Etiology: acute illness Signs/Symptoms: estimated needs Interventions: Prostat, MVI, Ensure Enlive (each supplement provides 350kcal and 20 grams of protein) DVT prophylaxis:  enoxaparin (LOVENOX) injection 40 mg Start: 08/13/21 1000   Code Status: Full code Family Communication: Updated patient's daughter-in-law, Velna Hatchet over the phone at patient's request. Level of care: Med-Surg Status is: Inpatient   Remains inpatient appropriate because: Need  for IV antibiotics for pyelonephritis and complicated UTI pending urine cultures   Immanuel Fedak, DO [facility] Direct contact: see [email or web address]  7PM-7AM contact night coverage as above 08/17/2021, 3:42 PM  LOS: 3 days

## 2021-08-19 MED ORDER — ENSURE ENLIVE PO LIQD: 237.0000 mL | Freq: Two times a day (BID) | ORAL | 12 refills | Status: DC

## 2021-08-19 MED ORDER — METOPROLOL TARTRATE 25 MG PO TABS: 12.5000 mg | ORAL_TABLET | Freq: Two times a day (BID) | ORAL | 0 refills | Status: DC

## 2021-08-19 MED ORDER — ADULT MULTIVITAMIN W/MINERALS CH: 1.0000 | ORAL_TABLET | Freq: Every day | ORAL | 0 refills | Status: DC

## 2021-08-19 MED ORDER — PROSOURCE PLUS PO LIQD: 30.0000 mL | Freq: Every day | ORAL | 0 refills | Status: DC

## 2021-08-19 NOTE — Plan of Care (Signed)
  Problem: Education: Goal: Knowledge of General Education information will improve Description: Including pain rating scale, medication(s)/side effects and non-pharmacologic comfort measures Outcome: Adequate for Discharge   

## 2021-08-19 NOTE — Consult Note (Signed)
   [facility] CM Inpatient Consult   08/19/2021  Brian Mccullough 05-Apr-1951 [id number]  [facility] [ACO] Patient: [name]  Primary Care Provider:  [name], Evie Lacks, MD, [place] is an embedded provider with a Chronic Care Management team and program, and is listed for the transition of care follow up and appointments. Patient encounter shows he is active with the Embedded CCM Pharmacist.  Patient was screened for Embedded practice service needs for chronic care management. Came to the unit and patient was transitioning home.  Plan:  Notification sent to the Embedded Care Management and made aware of post hospital transition.  Please contact for further questions,  Natividad Brood, RN BSN [facility] Liaison  [phone] business mobile phone Toll free office [phone]  Fax number: [phone] [email or web address]

## 2021-08-19 NOTE — Plan of Care (Signed)

## 2021-08-19 NOTE — Progress Notes (Signed)
Discharge instructions (including medications) discussed with and copy provided to patient/caregiver 

## 2021-08-23 NOTE — Discharge Summary (Signed)
Physician Discharge Summary  Brian Mccullough LTJ:[id number] DOB: 04-28-1951 DOA: 08/12/2021  PCP: Cassandria Anger, MD  Admit date: 08/12/2021 Discharge date: 08/19/2021  Recommendations for Outpatient Follow-up:  Discharge to home. Follow up with Surgery at [facility] after 08/22/2021 when you have completed antibiotic treatment for pyelonephritis. Continue Levaquin 250 mg daily until you have surgery. Follow up with Dr. Nicolette Bang at [facility] for Urology once treatment for pyelonephritis is completed.  Follow up with PCP in 7-10 days.   Follow-up Information     McKenzie, Candee Furbish, MD Follow up in 2 week(s).   Specialty: Urology Contact information: [place] [address] [phone]         Ileana Roup, MD Follow up in 2 week(s).   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 male with medical history significant for CAD, HTN, HLD, DMT2, BPH who presents for evaluation of fever, dysuria, urinary hesitancy for past 3-4 days.  He has a history of BPH and has been on Flomax but states he felt it was making him dizzy so he stopped it last week.  He reports he has not felt like he has been able to empty his bladder for the last 5 to 6 days and he developed suprapubic pressure and discomfort along with dysuria 3 to 4 days ago.  He  then began to experience pain on the right and developed a fever 3 days ago.  He reports that 2 days ago he had an episode of diarrhea but none since then.  He states he has not had any nausea vomiting.  He has not had any known sick contacts.  He has used Tylenol for the fever at home.  Symptoms have gradually worsened over the last 24 hours so he decided come for evaluation.  He has not had any chest pain, pressure, palpitations. Reports that he had a colonoscopy a few years ago and he has had polyps in the past but has never been told he has diverticulosis. Denies tobacco, alcohol, illicit drug use.   ED Course: Ammaar Encina has had a T-max of 103.2 degrees in the emergency room.  He has been hemodynamically stable.  Urinalysis is positive for urinary tract infection.  CT of the abdomen without contrast shows perinephric stranding on the right with possible small developing abscess as well as a possible colovesical fistula.  Radiologist notes diverticulosis Me[place]:.  ER physician discussed with urology who will see patient in the morning consultation and determine if cystoscopy or other intervention is required.  Patient is placed on Zosyn for empiric antibiotic coverage to cover pyelonephritis and possible diverticulitis.  COVID negative.  Influenza a and B are negative.  WBC is 10,100 hemoglobin 13.3 hematocrit 39.6 platelets 195,000.  Sodium 130 potassium 3.9 chloride 100 bicarb 20 creatinine 1.42 BUN 20 (baseline creatinine 1-1.15) calcium 8.4 alk phosphatase 70 AST 31 ALT 20 bilirubin 2.0  albumin 2.9.  Hospitalist service asked to admit patient for further management  Hospital Course:  70 year old M with PMH of CAD/CABG in 2021, DM-2, HTN, HLD and BPH presenting with fever, dysuria, urinary hesitancy, debris-filled urine and right flank pain and admitted with pyelonephritis with colovesical fistula.  Cultures obtained.  He was started on IV Zosyn.  Urology and general surgery consulted, and  recommended antibiotics with outpatient follow-up.  Blood cultures NGTD.  Urine culture with multiple species.  Repeat urine culture pending.   The patient has been receiving Zosyn IV since 08/13/2021. Infectious disease was consulted today. They have changed the patient to oral levaquin. They have recommended 500 mg daily through 08/22/2021 for ongoing treatment of the pyelonephritis. He should then take Levaquin 250 mg daily by mouth for suppression until the surgery is completed.    The patient will follow up with surgery as outpatient.    The patient was monitored for fevers for an additional 24 hours due to fever last night of 100.9. He had no further fevers and was discharged to home in fair condition.  Today's assessment: S: The patient is resting comfortably. No new complaints. O: Vitals:  Vitals:   08/19/21 0449 08/19/21 0809  BP: (!) 156/88 (!) 172/76  Pulse: 73 84  Resp: 18 17  Temp: 98 F (36.7 C) 98.5 F (36.9 C)  SpO2: 100% 100%    Constitutional:  The patient is awake, alert, and oriented x 3. No acute distress. Respiratory:  CTA bilaterally, no w/r/r.  Respiratory effort normal. No retractions or accessory muscle use Cardiovascular:  RRR, no m/r/g No LE extremity edema   Normal pedal pulses Abdomen:  Abdomen appears normal; no tenderness or masses No hernias No HSM Musculoskeletal:  Digits/nails BUE: no clubbing, cyanosis, petechiae, infection Skin No rashes, lesions, ulcers palpation of skin: no induration or nodules Neurologic:  CN 2-12 intact Sensation all 4 extremities intact Psychiatric:  Mental status Mood, affect appropriate Orientation to person, place, time  judgment and insight appear intact    Discharge Instructions  Discharge Instructions     Activity as tolerated - No restrictions   Complete by: As directed    Call MD for:  persistant nausea and vomiting   Complete by: As directed    Call MD for:  severe uncontrolled pain    Complete by: As directed    Diet - low sodium heart healthy   Complete by: As directed    Discharge instructions   Complete by: As directed    Discharge to home. Follow up with Surgery at [facility] after 08/22/2021 when you have completed antibiotic treatment for pyelonephritis. Continue Levaquin 250 mg daily until you have surgery. Follow up with Dr. Nicolette Bang at [facility] for Urology once treatment for pyelonephritis is completed.  Follow up with PCP in 7-10 days.   Increase activity slowly   Complete by: As directed       Allergies as of 08/19/2021       Reactions   Codeine Nausea Only        Medication List     STOP taking these medications    cholecalciferol 25 MCG (1000 UNIT) tablet Commonly known as: VITAMIN D   furosemide 20 MG tablet Commonly known as: LASIX   meloxicam 15 MG tablet Commonly known as: MOBIC   potassium chloride SA 20 MEQ tablet Commonly known as: KLOR-CON   tiZANidine 4 MG tablet Commonly known as: ZANAFLEX   traMADol 50 MG tablet  Commonly known as: ULTRAM       TAKE these medications    (feeding supplement) PROSource Plus liquid Take 30 mLs by mouth daily.   feeding supplement Liqd Take 237 mLs by mouth 2 (two) times daily between meals.   acetaminophen 500 MG tablet Commonly known as: TYLENOL Take 1,000 mg by mouth every 6 (six) hours as needed for headache or moderate pain.   aspirin 81 MG EC tablet Take 81 mg by mouth daily.   atorvastatin 40 MG tablet Commonly known as: LIPITOR Take 1 tablet (40 mg total) by mouth daily. What changed: when to take this   benazepril 20 MG tablet Commonly known as: LOTENSIN TAKE 1 TABLET BY MOUTH  DAILY   clopidogrel 75 MG tablet Commonly known as: PLAVIX Take 1 tablet (75 mg total) by mouth daily.   cyanocobalamin 1000 MCG tablet Take 1,000 mcg by mouth daily.   levofloxacin 250 MG tablet Commonly known as: LEVAQUIN Take 2 tablets (500 mg  total) by mouth daily for 5 days, THEN 1 tablet (250 mg total) daily for 25 days. Start taking on: August 18, 2021   loratadine 10 MG tablet Commonly known as: CLARITIN Take 10 mg by mouth daily as needed for allergies or rhinitis.   metFORMIN 500 MG tablet Commonly known as: GLUCOPHAGE TAKE 1 TABLET BY MOUTH  DAILY WITH BREAKFAST   metoprolol tartrate 25 MG tablet Commonly known as: LOPRESSOR Take 0.5 tablets (12.5 mg total) by mouth 2 (two) times daily. What changed: how much to take   multivitamin with minerals Tabs tablet Take 1 tablet by mouth daily.   tamsulosin 0.4 MG Caps capsule Commonly known as: FLOMAX Take 1 capsule (0.4 mg total) by mouth daily.   traZODone 50 MG tablet Commonly known as: DESYREL TAKE 1/2 TO 1 TABLET BY  MOUTH AT BEDTIME AS NEEDED  FOR SLEEP What changed:  how much to take how to take this when to take this reasons to take this additional instructions       Allergies  Allergen Reactions   Codeine Nausea Only    The results of significant diagnostics from this hospitalization (including imaging, microbiology, ancillary and laboratory) are listed below for reference.    Significant Diagnostic Studies: DG Chest 2 View  Result Date: 08/12/2021 CLINICAL DATA:  Cough and headache.  Diarrhea.  Fever and weakness. EXAM: CHEST - 2 VIEW COMPARISON:  08/16/2020 FINDINGS: Previous median sternotomy and CABG procedure. Heart size is normal. No pleural effusion or edema. No airspace densities. Spondylosis identified within the thoracic spine with large flowing ventral osteophytes. IMPRESSION: No active cardiopulmonary abnormalities. Electronically Signed   By: Kerby Moors M.D.   On: 08/12/2021 14:55   ECHOCARDIOGRAM COMPLETE  Result Date: 08/16/2021    ECHOCARDIOGRAM REPORT   Patient Name:   Brian Mccullough Date of Exam: 08/16/2021 Medical Rec #:  [id number]        Height:       75.0 in Accession #:    [id number]       Weight:       230.0 lb Date  of Birth:  Sep 24, 1951        BSA:          2.329 m Patient Age:    46 years         BP:           136/75 mmHg Patient Gender: M  HR:           74 bpm. Exam Location:  Inpatient Procedure: 2D Echo, Cardiac Doppler and Color Doppler Indications:    CHF  History:        Patient has prior history of Echocardiogram examinations. Prior                 CABG; Risk Factors:Diabetes, Dyslipidemia and Hypertension.  Sonographer:    Jyl Heinz Referring Phys: [phone] Threasa Kinch IMPRESSIONS  1. Left ventricular ejection fraction, by estimation, is 60 to 65%. The left ventricle has normal function. The left ventricle has no regional wall motion abnormalities. Left ventricular diastolic parameters were normal.  2. Right ventricular systolic function is normal. The right ventricular size is normal.  3. Left atrial size was mildly dilated.  4. The mitral valve is normal in structure. No evidence of mitral valve regurgitation. No evidence of mitral stenosis.  5. The aortic valve is normal in structure. Aortic valve regurgitation is not visualized. No aortic stenosis is present.  6. The inferior vena cava is normal in size with greater than 50% respiratory variability, suggesting right atrial pressure of 3 mmHg. Comparison(s): No significant change from prior study. Prior images reviewed side by side. FINDINGS  Left Ventricle: Left ventricular ejection fraction, by estimation, is 60 to 65%. The left ventricle has normal function. The left ventricle has no regional wall motion abnormalities. The left ventricular internal cavity size was normal in size. There is  no left ventricular hypertrophy. Abnormal (paradoxical) septal motion consistent with post-operative status. Left ventricular diastolic parameters were normal. Right Ventricle: The right ventricular size is normal. No increase in right ventricular wall thickness. Right ventricular systolic function is normal. Left Atrium: Left atrial size was mildly dilated. Right  Atrium: Right atrial size was normal in size. Prominent Eustachian valve. Pericardium: There is no evidence of pericardial effusion. Mitral Valve: The mitral valve is normal in structure. No evidence of mitral valve regurgitation. No evidence of mitral valve stenosis. Tricuspid Valve: The tricuspid valve is normal in structure. Tricuspid valve regurgitation is trivial. No evidence of tricuspid stenosis. Aortic Valve: The aortic valve is normal in structure. Aortic valve regurgitation is not visualized. No aortic stenosis is present. Aortic valve peak gradient measures 6.6 mmHg. Pulmonic Valve: The pulmonic valve was normal in structure. Pulmonic valve regurgitation is trivial. No evidence of pulmonic stenosis. Aorta: The aortic root is normal in size and structure. Venous: The inferior vena cava is normal in size with greater than 50% respiratory variability, suggesting right atrial pressure of 3 mmHg. IAS/Shunts: No atrial level shunt detected by color flow Doppler.  LEFT VENTRICLE PLAX 2D LVIDd:         5.00 cm      Diastology LVIDs:         4.10 cm      LV e' medial:    5.22 cm/s LV PW:         1.10 cm      LV E/e' medial:  14.5 LV IVS:        1.20 cm      LV e' lateral:   11.00 cm/s LVOT diam:     2.50 cm      LV E/e' lateral: 6.9 LV SV:         105 LV SV Index:   45 LVOT Area:     4.91 cm  LV Volumes (MOD) LV vol d, MOD A2C: 175.0 ml LV vol d, MOD A4C:  184.0 ml LV vol s, MOD A2C: 80.0 ml LV vol s, MOD A4C: 79.5 ml LV SV MOD A2C:     95.0 ml LV SV MOD A4C:     184.0 ml LV SV MOD BP:      101.8 ml RIGHT VENTRICLE            IVC RV Basal diam:  4.60 cm    IVC diam: 1.90 cm RV Mid diam:    3.20 cm RV S prime:     8.38 cm/s TAPSE (M-mode): 1.9 cm LEFT ATRIUM             Index        RIGHT ATRIUM           Index LA diam:        4.70 cm 2.02 cm/m   RA Area:     20.90 cm LA Vol (A2C):   77.8 ml 33.41 ml/m  RA Volume:   59.20 ml  25.42 ml/m LA Vol (A4C):   57.3 ml 24.61 ml/m LA Biplane Vol: 67.6 ml 29.03 ml/m   AORTIC VALVE AV Area (Vmax): 4.68 cm AV Vmax:        128.00 cm/s AV Peak Grad:   6.6 mmHg LVOT Vmax:      122.00 cm/s LVOT Vmean:     82.100 cm/s LVOT VTI:       0.213 m  AORTA Ao Root diam: 3.60 cm Ao Asc diam:  3.60 cm MITRAL VALVE MV Area (PHT): 3.74 cm    SHUNTS MV Decel Time: 203 msec    Systemic VTI:  0.21 m MV E velocity: 75.80 cm/s  Systemic Diam: 2.50 cm MV A velocity: 68.30 cm/s MV E/A ratio:  1.11 Mihai Croitoru MD Electronically signed by Sanda Klein MD Signature Date/Time: 08/16/2021/4:54:53 PM    Final    CT Renal Stone Study  Result Date: 08/12/2021 CLINICAL DATA:  Hematuria. EXAM: CT ABDOMEN AND PELVIS WITHOUT CONTRAST TECHNIQUE: Multidetector CT imaging of the abdomen and pelvis was performed following the standard protocol without IV contrast. COMPARISON:  CT abdomen pelvis report dated 05/02/2000. The images are not available for direct comparison. FINDINGS: Evaluation of this exam is limited in the absence of intravenous contrast. Lower chest: The visualized lung bases are clear. There is coronary vascular calcification. No intra-abdominal free air or free fluid. Hepatobiliary: The liver is unremarkable. No intrahepatic biliary dilatation. Small gallstones. No pericholecystic fluid or evidence of acute cholecystitis by CT. Pancreas: Unremarkable. No pancreatic ductal dilatation or surrounding inflammatory changes. Spleen: Normal in size without focal abnormality. Adrenals/Urinary Tract: The adrenal glands are unremarkable. There is a 9 mm nonobstructing left renal inferior pole calculus. No hydronephrosis. There is no hydronephrosis or nephrolithiasis on the right. Faint area of hypodensity in the right renal parenchyma is not evaluated but may represent a cyst or focal area of edema or developing abscess. There is right perinephric stranding. Correlation with urinalysis recommended to evaluate for possibility of pyelonephritis. The visualized ureters appear unremarkable. There is diffuse  haziness of the bladder wall concerning for cystitis. There is loss of fat plane between the sigmoid colon and bladder dome with air within the urinary bladder most consistent with a colovesical fistula. Stomach/Bowel: There is sigmoid diverticulosis without definite active inflammation. There is no bowel obstruction. The appendix is normal. Vascular/Lymphatic: Moderate aortoiliac atherosclerotic disease. The IVC is unremarkable. No portal venous gas. There is no adenopathy. Reproductive: The prostate and seminal vesicles are grossly unremarkable. No pelvic  mass. Other: None Musculoskeletal: Degenerative changes of the spine. No acute osseous pathology. IMPRESSION: 1. Sigmoid diverticulosis with colovesical fistula and findings of UTI and right-sided pyelonephritis. Developing abscess in the right kidney is not excluded. 2. A 9 mm nonobstructing left renal inferior pole calculus. No hydronephrosis. 3. Cholelithiasis. 4. No bowel obstruction. Normal appendix. 5. Aortic Atherosclerosis (ICD10-I70.0). Electronically Signed   By: Anner Crete M.D.   On: 08/12/2021 21:55    Microbiology: Recent Results (from the past 240 hour(s))  Urine Culture     Status: Abnormal   Collection Time: 08/14/21  6:20 AM   Specimen: Urine, Clean Catch  Result Value Ref Range Status   Specimen Description URINE, CLEAN CATCH  Final   Special Requests   Final    NONE Performed at [facility], [place] [address] [place], [place] [address]    Culture MULTIPLE SPECIES PRESENT, SUGGEST RECOLLECTION (A)  Final   Report Status 08/15/2021 FINAL  Final  Urine Culture     Status: Abnormal   Collection Time: 08/15/21  9:57 AM   Specimen: Urine, Clean Catch  Result Value Ref Range Status   Specimen Description URINE, CLEAN CATCH  Final   Special Requests   Final    NONE Performed at [facility], [place] [address], [place], [place] [address]    Culture 80,000 COLONIES/mL PSEUDOMONAS AERUGINOSA (A)  Final   Report  Status 08/17/2021 FINAL  Final   Organism ID, Bacteria PSEUDOMONAS AERUGINOSA (A)  Final      Susceptibility   Pseudomonas aeruginosa - MIC*    CEFTAZIDIME 4 SENSITIVE Sensitive     CIPROFLOXACIN <=0.25 SENSITIVE Sensitive     GENTAMICIN 2 SENSITIVE Sensitive     IMIPENEM <=0.25 SENSITIVE Sensitive     PIP/TAZO 16 SENSITIVE Sensitive     CEFEPIME 0.5 SENSITIVE Sensitive     * 80,000 COLONIES/mL PSEUDOMONAS AERUGINOSA     Labs: Basic Metabolic Panel: Recent Labs  Lab 08/18/21 0329  NA 137  K 3.7  CL 106  CO2 25  GLUCOSE 104*  BUN 8  CREATININE 0.95  CALCIUM 8.2*   Liver Function Tests: No results for input(s): AST, ALT, ALKPHOS, BILITOT, PROT, ALBUMIN in the last 168 hours. No results for input(s): LIPASE, AMYLASE in the last 168 hours. No results for input(s): AMMONIA in the last 168 hours. CBC: Recent Labs  Lab 08/18/21 0329  WBC 4.5  NEUTROABS 2.5  HGB 10.4*  HCT 32.2*  MCV 85.4  PLT 316   Cardiac Enzymes: No results for input(s): CKTOTAL, CKMB, CKMBINDEX, TROPONINI in the last 168 hours. BNP: BNP (last 3 results) No results for input(s): BNP in the last 8760 hours.  ProBNP (last 3 results) No results for input(s): PROBNP in the last 8760 hours.  CBG: No results for input(s): GLUCAP in the last 168 hours.  Principal Problem:   Pyelonephritis Active Problems:   Diabetes mellitus type 2, controlled ([place])   HTN (hypertension)   S/P CABG x 6   BPH (benign prostatic hyperplasia)   AKI (acute kidney injury) ([place])   Hyponatremia   Time coordinating discharge: 38 minutes  Signed:        Aron Needles, DO [facility]  08/23/2021, 5:12 PM

## 2021-09-09 ENCOUNTER — Other Ambulatory Visit: Payer: Self-pay | Admitting: Internal Medicine

## 2021-09-27 ENCOUNTER — Other Ambulatory Visit: Payer: Self-pay

## 2021-09-27 ENCOUNTER — Ambulatory Visit (INDEPENDENT_AMBULATORY_CARE_PROVIDER_SITE_OTHER): Payer: Medicare Other

## 2021-09-27 DIAGNOSIS — Z0000 Encounter for general adult medical examination without abnormal findings: Secondary | ICD-10-CM | POA: Diagnosis not present

## 2021-09-27 NOTE — Progress Notes (Addendum)
I connected with Brian Mccullough today by telephone and verified that I am speaking with the correct person using two identifiers. Location patient: home Location provider: work Persons participating in the virtual visit: patient, provider.   I discussed the limitations, risks, security and privacy concerns of performing an evaluation and management service by telephone and the availability of in person appointments. I also discussed with the patient that there may be a patient responsible charge related to this service. The patient expressed understanding and verbally consented to this telephonic visit.    Interactive audio and video telecommunications were attempted between this provider and patient, however failed, due to patient having technical difficulties OR patient did not have access to video capability.  We continued and completed visit with audio only.  Some vital signs may be absent or patient reported.   Time Spent with patient on telephone encounter: 40 minutes  Subjective:   Brian Mccullough is a 70 y.o. male who presents for Medicare Annual/Subsequent preventive examination.  Review of Systems     Cardiac Risk Factors include: advanced age (>78men, >65 women);diabetes mellitus;family history of premature cardiovascular disease;dyslipidemia;hypertension;male gender     Objective:    There were no vitals filed for this visit. There is no height or weight on file to calculate BMI.  Advanced Directives 09/27/2021 08/13/2021 08/12/2021 08/04/2020 07/30/2020 05/31/2017 04/18/2017  Does Patient Have a Medical Advance Directive? No No No No No No No  Would patient like information on creating a medical advance directive? No - Patient declined - Yes (ED - Information included in AVS) No - Patient declined No - Patient declined No - Patient declined -    Current Medications (verified) Outpatient Encounter Medications as of 09/27/2021  Medication Sig   acetaminophen (TYLENOL) 500  MG tablet Take 1,000 mg by mouth every 6 (six) hours as needed for headache or moderate pain.   aspirin 81 MG EC tablet Take 81 mg by mouth daily.   atorvastatin (LIPITOR) 40 MG tablet Take 1 tablet (40 mg total) by mouth daily. (Patient taking differently: Take 40 mg by mouth every other day.)   benazepril (LOTENSIN) 20 MG tablet TAKE 1 TABLET BY MOUTH  DAILY (Patient taking differently: Take 20 mg by mouth daily.)   clopidogrel (PLAVIX) 75 MG tablet Take 1 tablet (75 mg total) by mouth daily.   cyanocobalamin 1000 MCG tablet Take 1,000 mcg by mouth daily.   feeding supplement (ENSURE ENLIVE / ENSURE PLUS) LIQD Take 237 mLs by mouth 2 (two) times daily between meals.   loratadine (CLARITIN) 10 MG tablet Take 10 mg by mouth daily as needed for allergies or rhinitis.   metFORMIN (GLUCOPHAGE) 500 MG tablet TAKE 1 TABLET BY MOUTH  DAILY WITH BREAKFAST (Patient taking differently: Take 500 mg by mouth daily with breakfast.)   metoprolol tartrate (LOPRESSOR) 25 MG tablet Take 0.5 tablets (12.5 mg total) by mouth 2 (two) times daily.   Multiple Vitamin (MULTIVITAMIN WITH MINERALS) TABS tablet Take 1 tablet by mouth daily.   Nutritional Supplements (,FEEDING SUPPLEMENT, PROSOURCE PLUS) liquid Take 30 mLs by mouth daily.   tamsulosin (FLOMAX) 0.4 MG CAPS capsule Take 1 capsule (0.4 mg total) by mouth daily.   traZODone (DESYREL) 50 MG tablet TAKE 1/2 TO 1 TABLET BY  MOUTH AT BEDTIME AS NEEDED  FOR SLEEP (Patient taking differently: Take 25-50 mg by mouth at bedtime as needed for sleep.)   No facility-administered encounter medications on file as of 09/27/2021.    Allergies (verified)  Codeine   History: Past Medical History:  Diagnosis Date   Allergy    CAD (coronary artery disease)    s/p Inf STEMI 10/21>>POBA to RCA >> s/p CABG (L-LAD, S-PDA, L Radial-OM1/RI, RIMA-RPLA) // post op AF; SVT >> Amiod Rx // Echo 10/21: EF 55-60, no RWMA, GLS -18.5, normal RVSF, mild LAE, trivial MR    Carotid artery  disease ([place])    [place] 10/21: R 1-39; L 40-59   Hyperactive gag reflex    Hypertension    under control with meds., has been on med. x 30 yr.   Inguinal hernia 01/2014   bilateral   LBP (low back pain)    Non-insulin dependent type 2 diabetes mellitus (HCC)    OA (osteoarthritis) of knee 01/2004   left   Palpitations    s/p Inf STEMI 07/2020   POBA to RCA >> CABG   Vitamin B 12 deficiency    Past Surgical History:  Procedure Laterality Date   CARDIAC CATHETERIZATION     CORONARY ARTERY BYPASS GRAFT N/A 08/04/2020   Procedure: CORONARY ARTERY BYPASS GRAFTING (CABG), ON PUMP, TIMES SIX, USING BILATERAL INTERNAL MAMMARY ARTERIES, RIGHT ENDOSCOPICALLY HARVESTED GREATER SAPHENOUS VEIN, AND RIGHT RADIAL ARTERY OPEN HARVEST;  Surgeon: Wonda Olds, MD;  Location: [place];  Service: Open Heart Surgery;  Laterality: N/A;   CORONARY/GRAFT ACUTE MI REVASCULARIZATION N/A 08/03/2020   Procedure: Coronary/Graft Acute MI Revascularization;  Surgeon: Sherren Mocha, MD;  Location: [place] [facility];  Service: Cardiovascular;  Laterality: N/A;   ENDOVEIN HARVEST OF GREATER SAPHENOUS VEIN Right 08/04/2020   Procedure: ENDOVEIN HARVEST OF GREATER SAPHENOUS VEIN;  Surgeon: Wonda Olds, MD;  Location: [place];  Service: Open Heart Surgery;  Laterality: Right;   INGUINAL HERNIA REPAIR  age [age]   INGUINAL HERNIA REPAIR Bilateral 02/09/2014   Procedure: OPEN BILATERAL INGUINAL HERNIA REPAIR WITH MESH;  Surgeon: [name], MD;  Location: [place];  Service: General;  Laterality: Bilateral;   INSERTION OF MESH Bilateral 02/09/2014   Procedure: INSERTION OF MESH;  Surgeon: [name], MD;  Location: [place];  Service: General;  Laterality: Bilateral;   KNEE ARTHROSCOPY Right    LEFT HEART CATH AND CORONARY ANGIOGRAPHY N/A 08/03/2020   Procedure: LEFT HEART CATH AND CORONARY ANGIOGRAPHY;  Surgeon: Sherren Mocha, MD;  Location: [place] [facility];  Service:  Cardiovascular;  Laterality: N/A;   ORCHIECTOMY Left 05/31/2017   Procedure: LEFT RADICAL ORCHIECTOMY;  Surgeon: Irine Seal, MD;  Location: [facility];  Service: Urology;  Laterality: Left;   RADIAL ARTERY HARVEST Right 08/04/2020   Procedure: RADIAL ARTERY HARVEST;  Surgeon: Wonda Olds, MD;  Location: [place];  Service: Open Heart Surgery;  Laterality: Right;   TEE WITHOUT CARDIOVERSION N/A 08/04/2020   Procedure: TRANSESOPHAGEAL ECHOCARDIOGRAM (TEE);  Surgeon: Wonda Olds, MD;  Location: [place];  Service: Open Heart Surgery;  Laterality: N/A;   TONSILLECTOMY AND ADENOIDECTOMY  1960   TOTAL KNEE ARTHROPLASTY Left 01/14/2004   Family History  Problem Relation Age of Onset   Hypertension Mother    Diabetes Mother    Diabetes Father    Colon cancer Neg Hx    Esophageal cancer Neg Hx    Rectal cancer Neg Hx    Stomach cancer Neg Hx    Social History   Socioeconomic History   Marital status: Married    Spouse name: Not on file   Number of children: Not on  file   Years of education: 12   Highest education level: Not on file  Occupational History   Occupation: diabled    Employer: UNEMPLOYED    Comment: due to knees  Tobacco Use   Smoking status: Former    Types: Cigars    Quit date: 08/03/2020    Years since quitting: 1.1   Smokeless tobacco: Never   Tobacco comments:    2-3 small cigars/day  Vaping Use   Vaping Use: Never used  Substance and Sexual Activity   Alcohol use: No   Drug use: No   Sexual activity: Yes  Other Topics Concern   Not on file  Social History Narrative   Not on file   Social Determinants of Health   Financial Resource Strain: Low Risk    Difficulty of Paying Living Expenses: Not hard at all  Food Insecurity: No Food Insecurity   Worried About Charity fundraiser in the Last Year: Never true   [place] in the Last Year: Never true  Transportation Needs: No Transportation Needs   Lack of Transportation (Medical):  No   Lack of Transportation (Non-Medical): No  Physical Activity: Sufficiently Active   Days of Exercise per Week: 5 days   Minutes of Exercise per Session: 30 min  Stress: No Stress Concern Present   Feeling of Stress : Not at all  Social Connections: Moderately Isolated   Frequency of Communication with Friends and Family: More than three times a week   Frequency of Social Gatherings with Friends and Family: More than three times a week   Attends Religious Services: Never   Marine scientist or Organizations: No   Attends Music therapist: Never   Marital Status: Married    Tobacco Counseling Counseling given: Not Answered Tobacco comments: 2-3 small cigars/day   Clinical Intake:  Pre-visit preparation completed: Yes  Pain : No/denies pain     Nutritional Risks: None Diabetes: Yes CBG done?: No Did pt. bring in CBG monitor from home?: No  How often do you need to have someone help you when you read instructions, pamphlets, or other written materials from your doctor or pharmacy?: 1 - Never What is the last grade level you completed in school?: High School Graduate  Diabetic? yes  Interpreter Needed?: No  Information entered by :: Lisette Abu, LPN   Activities of Daily Living In your present state of health, do you have any difficulty performing the following activities: 09/27/2021 08/13/2021  Hearing? N N  Vision? N N  Difficulty concentrating or making decisions? N N  Walking or climbing stairs? N N  Dressing or bathing? N N  Doing errands, shopping? N N  Preparing Food and eating ? N -  Using the Toilet? N -  In the past six months, have you accidently leaked urine? N -  Managing your Finances? N -  Housekeeping or managing your Housekeeping? N -  Some recent data might be hidden    Patient Care Team: [name], Evie Lacks, MD as PCP - General (Internal Medicine) Sherren Mocha, MD as PCP - Cardiology (Cardiology) Marily Memos, MD (Orthopedic Surgery) Wonda Olds, MD as Consulting Physician (Cardiothoracic Surgery) Sharmon Revere as Physician Assistant (Cardiology) Delice Bison Darnelle Maffucci, [facility] as Pharmacist (Pharmacist)  Indicate any recent Medical Services you may have received from other than [facility] providers in the past year (date may be approximate).     Assessment:   This is a  routine wellness examination for Brian Mccullough.  Hearing/Vision screen Hearing Screening - Comments:: Patient denied any hearing difficulty. No hearing aids. Vision Screening - Comments:: Patient wears reading glasses only. Patient does not have an ophthalmologist. Patient is currently having blurred vision with distance. Referral to ophthalmologist will be placed.  Dietary issues and exercise activities discussed: Current Exercise Habits: Home exercise routine, Type of exercise: walking, Time (Minutes): 30, Frequency (Times/Week): 5, Weekly Exercise (Minutes/Week): 150, Intensity: Moderate, Exercise limited by: orthopedic condition(s);cardiac condition(s)   Goals Addressed   None   Depression Screen PHQ 2/9 Scores 09/27/2021 12/10/2020 10/12/2020 07/30/2020 12/22/2019 12/20/2018 04/09/2017  PHQ - 2 Score 0 0 0 0 0 0 0    Fall Risk Fall Risk  09/27/2021 10/12/2020 07/30/2020 12/22/2019 12/20/2018  Falls in the past year? 0 0 0 0 0  Number falls in past yr: 0 - 0 - -  Injury with Fall? 0 - 0 - -  Risk for fall due to : No Fall Risks Other (Comment) No Fall Risks - -  Risk for fall due to: Comment - Single leg stand less than 15 seconds - - -  Follow up Falls prevention discussed Falls evaluation completed Falls evaluation completed Falls evaluation completed Falls evaluation completed    [place]:  Any stairs in or around the home? No  If so, are there any without handrails? No  Home free of loose throw rugs in walkways, pet beds, electrical cords, etc? Yes  Adequate lighting in your home to  reduce risk of falls? Yes   ASSISTIVE DEVICES UTILIZED TO PREVENT FALLS:  Life alert? No  Use of a cane, walker or w/c? No  Grab bars in the bathroom? No  Shower chair or bench in shower? No  Elevated toilet seat or a handicapped toilet? Yes   TIMED UP AND GO:  Was the test performed? No .  Length of time to ambulate 10 feet: n/a sec.   Gait steady and fast without use of assistive device  Cognitive Function: Normal cognitive status assessed by direct observation by this Nurse Health Advisor. No abnormalities found.          Immunizations Immunization History  Administered Date(s) Administered   Fluad Quad(high Dose 65+) 07/30/2020, 08/18/2021   Influenza Split 09/28/2011, 06/19/2012   Influenza Whole 06/29/2008, 07/05/2009, 09/20/2010   Influenza, High Dose Seasonal PF 08/14/2017, 06/14/2018, 07/12/2019   Influenza,inj,Quad PF,6+ Mos 06/19/2013, 05/29/2014, 08/30/2015, 08/01/2016   PFIZER(Purple Top)SARS-COV-2 Vaccination 10/28/2019, 11/18/2019, 07/21/2020   Pfizer Covid-19 Vaccine Bivalent Booster 50yrs & up 08/16/2021   Pneumococcal Conjugate-13 05/29/2014   Pneumococcal Polysaccharide-23 11/02/2009, 11/15/2017   Td 11/02/2009   Tdap 07/30/2020   Zoster, Live 12/30/2013    TDAP status: Up to date  Flu Vaccine status: Up to date  Pneumococcal vaccine status: Up to date  Covid-19 vaccine status: Completed vaccines  Qualifies for Shingles Vaccine? Yes   Zostavax completed Yes   Shingrix Completed?: No.    Education has been provided regarding the importance of this vaccine. Patient has been advised to call insurance company to determine out of pocket expense if they have not yet received this vaccine. Advised may also receive vaccine at local pharmacy or Health Dept. Verbalized acceptance and understanding.  Screening Tests Health Maintenance  Topic Date Due   FOOT EXAM  Never done   Zoster Vaccines- Shingrix (1 of 2) Never done   OPHTHALMOLOGY EXAM   05/25/2019   COLONOSCOPY (Pts 45-52yrs Insurance coverage  will need to be confirmed)  04/18/2020   HEMOGLOBIN A1C  09/28/2021   TETANUS/TDAP  07/30/2030   Pneumonia Vaccine 35+ Years old  Completed   INFLUENZA VACCINE  Completed   COVID-19 Vaccine  Completed   Hepatitis C Screening  Completed   HPV VACCINES  Aged Out    Health Maintenance  Health Maintenance Due  Topic Date Due   FOOT EXAM  Never done   Zoster Vaccines- Shingrix (1 of 2) Never done   OPHTHALMOLOGY EXAM  05/25/2019   COLONOSCOPY (Pts 45-69yrs Insurance coverage will need to be confirmed)  04/18/2020    Colorectal cancer screening: Type of screening: Colonoscopy. Completed 04/18/2017. Repeat every 3 years (Per patient scheduled for 10/2021)  Lung Cancer Screening: (Low Dose CT Chest recommended if Age 3-80 years, 30 pack-year currently smoking OR have quit w/in 15years.) does not qualify.   Lung Cancer Screening Referral: no  Additional Screening:  Hepatitis C Screening: does qualify; Completed yes  Vision Screening: Recommended annual ophthalmology exams for early detection of glaucoma and other disorders of the eye. Is the patient up to date with their annual eye exam?  No  Who is the provider or what is the name of the office in which the patient attends annual eye exams? N/A If pt is not established with a provider, would they like to be referred to a provider to establish care? Yes .   Dental Screening: Recommended annual dental exams for proper oral hygiene  Community Resource Referral / Chronic Care Management: CRR required this visit?  No   CCM required this visit?  No      Plan:     I have personally reviewed and noted the following in the patients chart:   Medical and social history Use of alcohol, tobacco or illicit drugs  Current medications and supplements including opioid prescriptions. Patient is not currently taking opioid prescriptions. Functional ability and status Nutritional  status Physical activity Advanced directives List of other physicians Hospitalizations, surgeries, and ER visits in previous 12 months Vitals Screenings to include cognitive, depression, and falls Referrals and appointments  In addition, I have reviewed and discussed with patient certain preventive protocols, quality metrics, and best practice recommendations. A written personalized care plan for preventive services as well as general preventive health recommendations were provided to patient.     Sheral Flow, LPN   [id number]   Nurse Notes:  Patient is cogitatively intact. There were no vitals filed for this visit. There is no height or weight on file to calculate BMI. Patient stated that he has no issues with gait or balance; does not use any assistive devices. Medications reviewed with patient; no opioid use noted. Hearing Screening - Comments:: Patient denied any hearing difficulty. No hearing aids. Vision Screening - Comments:: Patient wears reading glasses only. Patient does not have an ophthalmologist. Patient is currently having blurred vision with distance. Referral to ophthalmologist will be placed.   Medical screening examination/treatment/procedure(s) were performed by non-physician practitioner and as supervising physician I was immediately available for consultation/collaboration.  I agree with above. Lew Dawes, MD

## 2021-09-28 ENCOUNTER — Ambulatory Visit: Payer: Medicare Other

## 2021-09-28 ENCOUNTER — Encounter: Payer: Self-pay | Admitting: Internal Medicine

## 2021-09-28 ENCOUNTER — Ambulatory Visit (INDEPENDENT_AMBULATORY_CARE_PROVIDER_SITE_OTHER): Payer: Medicare Other | Admitting: Internal Medicine

## 2021-09-28 DIAGNOSIS — M5441 Lumbago with sciatica, right side: Secondary | ICD-10-CM | POA: Diagnosis not present

## 2021-09-28 DIAGNOSIS — G8929 Other chronic pain: Secondary | ICD-10-CM | POA: Diagnosis not present

## 2021-09-28 DIAGNOSIS — M5442 Lumbago with sciatica, left side: Secondary | ICD-10-CM

## 2021-09-28 DIAGNOSIS — N179 Acute kidney failure, unspecified: Secondary | ICD-10-CM | POA: Diagnosis not present

## 2021-09-28 DIAGNOSIS — N321 Vesicointestinal fistula: Secondary | ICD-10-CM | POA: Insufficient documentation

## 2021-09-28 LAB — CBC WITH DIFFERENTIAL/PLATELET
Basophils Absolute: 0 10*3/uL (ref 0.0–0.1)
Basophils Relative: 0.6 % (ref 0.0–3.0)
Eosinophils Absolute: 0.2 10*3/uL (ref 0.0–0.7)
Eosinophils Relative: 2.6 % (ref 0.0–5.0)
HCT: 39.2 % (ref 39.0–52.0)
Hemoglobin: 13.1 g/dL (ref 13.0–17.0)
Lymphocytes Relative: 24.7 % (ref 12.0–46.0)
Lymphs Abs: 1.7 10*3/uL (ref 0.7–4.0)
MCHC: 33.5 g/dL (ref 30.0–36.0)
MCV: 86.5 fl (ref 78.0–100.0)
Monocytes Absolute: 0.7 10*3/uL (ref 0.1–1.0)
Monocytes Relative: 9.6 % (ref 3.0–12.0)
Neutro Abs: 4.4 10*3/uL (ref 1.4–7.7)
Neutrophils Relative %: 62.5 % (ref 43.0–77.0)
Platelets: 204 10*3/uL (ref 150.0–400.0)
RBC: 4.53 Mil/uL (ref 4.22–5.81)
RDW: 15.5 % (ref 11.5–15.5)
WBC: 7.1 10*3/uL (ref 4.0–10.5)

## 2021-09-28 LAB — URINALYSIS, ROUTINE W REFLEX MICROSCOPIC
Bilirubin Urine: NEGATIVE
Ketones, ur: NEGATIVE
Nitrite: POSITIVE — AB
Specific Gravity, Urine: >=1.03 — AB (ref 1.000–1.030)
Urine Glucose: NEGATIVE
Urobilinogen, UA: 0.2 (ref 0.0–1.0)
pH: 5.5 (ref 5.0–8.0)

## 2021-09-28 LAB — COMPREHENSIVE METABOLIC PANEL
ALT: 8 U/L (ref 0–53)
AST: 15 U/L (ref 0–37)
Albumin: 3.8 g/dL (ref 3.5–5.2)
Alkaline Phosphatase: 57 U/L (ref 39–117)
BUN: 14 mg/dL (ref 6–23)
CO2: 29 mEq/L (ref 19–32)
Calcium: 9.8 mg/dL (ref 8.4–10.5)
Chloride: 107 mEq/L (ref 96–112)
Creatinine, Ser: 1.07 mg/dL (ref 0.40–1.50)
GFR: 70.76 mL/min (ref >60.00)
Glucose, Bld: 83 mg/dL (ref 70–99)
Potassium: 4.1 mEq/L (ref 3.5–5.1)
Sodium: 141 mEq/L (ref 135–145)
Total Bilirubin: 0.5 mg/dL (ref 0.2–1.2)
Total Protein: 7 g/dL (ref 6.0–8.3)

## 2021-09-28 MED ORDER — TRAMADOL HCL 50 MG PO TABS: 50.0000 mg | ORAL_TABLET | Freq: Four times a day (QID) | ORAL | 1 refills | Status: AC | PRN

## 2021-09-28 MED ORDER — TIZANIDINE HCL 4 MG PO TABS: 4.0000 mg | ORAL_TABLET | Freq: Three times a day (TID) | ORAL | 0 refills | Status: DC | PRN

## 2021-09-28 MED ORDER — CEFUROXIME AXETIL 500 MG PO TABS: 500.0000 mg | ORAL_TABLET | Freq: Two times a day (BID) | ORAL | 0 refills | Status: DC

## 2021-09-28 NOTE — Patient Instructions (Signed)
TENS unit for pain

## 2021-09-28 NOTE — Progress Notes (Signed)
Subjective:  Patient ID: Brian Mccullough, male    DOB: 12/14/1950  Age: 70 y.o. MRN: [id number]  CC: Follow-up and Annual Exam (Req referral to eye doctor for blurred vision)   HPI [name] presents for pyelonephritis, OA, LBP, ARI - post-hospital f/u... C/o poss another UTI now... Mr Edler was found to have a possible small developing abscess as well as a possible colovesical fistula.   "Admit date: 08/12/2021 Discharge date: 08/19/2021   Recommendations for Outpatient Follow-up:  Discharge to home. Follow up with Surgery at [facility] after 08/22/2021 when you have completed antibiotic treatment for pyelonephritis. Continue Levaquin 250 mg daily until you have surgery. Follow up with Dr. Nicolette Bang at [facility] for Urology once treatment for pyelonephritis is completed.  Follow up with PCP in 7-10 days.     Follow-up Information       McKenzie, Candee Furbish, MD Follow up in 2 week(s).   Specialty: Urology Contact information: [place] [address] [phone]              Ileana Roup, MD Follow up in 2 week(s).   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 male with medical history significant for CAD, HTN, HLD, DMT2, BPH who presents for evaluation of fever, dysuria, urinary hesitancy for past 3-4  days.  He has a history of BPH and has been on Flomax but states he felt it was making him dizzy so he stopped it last week.  He reports he has not felt like he has been able to empty his bladder for the last 5 to 6 days and he developed suprapubic pressure and discomfort along with dysuria 3 to 4 days ago.  He then began to experience pain on the right and developed a fever 3 days ago.  He reports that 2 days ago he had an episode of diarrhea but none since then.  He states he has not had any nausea vomiting.  He has not had any known sick contacts.  He has used Tylenol for the fever at home.  Symptoms have gradually worsened over the last 24 hours so he decided come for evaluation.  He has not had any chest pain, pressure, palpitations. Reports that he had a colonoscopy a few years ago and he has had polyps in the past but has never been told he has diverticulosis. Denies tobacco, alcohol, illicit drug use.   ED Course: [name] has had a T-max of 103.2 degrees in the emergency room.  He has been hemodynamically stable.  Urinalysis is positive for urinary tract infection.  CT of the abdomen without contrast shows perinephric stranding on the right with possible small developing abscess as well as a possible colovesical fistula.  Radiologist notes diverticulosis [place]:.  ER physician discussed  with urology who will see patient in the morning consultation and determine if cystoscopy or other intervention is required.  Patient is placed on Zosyn for empiric antibiotic coverage to cover pyelonephritis and possible diverticulitis.  COVID negative.  Influenza a and B are negative.  WBC is 10,100 hemoglobin 13.3 hematocrit 39.6 platelets 195,000.  Sodium 130 potassium 3.9 chloride 100 bicarb 20 creatinine 1.42 BUN 20 (baseline creatinine 1-1.15) calcium 8.4 alk phosphatase 70 AST 31 ALT 20 bilirubin 2.0 albumin 2.9.  Hospitalist service asked to admit patient for further management   Hospital Course:   70 year old M with PMH of CAD/CABG in 2021, DM-2, HTN, HLD and BPH presenting with fever, dysuria, urinary hesitancy, debris-filled urine and right flank pain and admitted with pyelonephritis with colovesical fistula.  Cultures obtained.  He was started on IV Zosyn.  Urology and general surgery consulted, and recommended antibiotics with outpatient follow-up.  Blood cultures NGTD.  Urine culture with multiple species.  Repeat urine culture pending.   The patient has been receiving Zosyn IV since 08/13/2021. Infectious disease was consulted today. They have changed the patient to oral levaquin. They have recommended 500 mg daily through 08/22/2021 for ongoing treatment of the pyelonephritis. He should then take Levaquin 250 mg daily by mouth for suppression until the surgery is completed.    The patient will follow up with surgery as outpatient.    The patient was monitored for fevers for an additional 24 hours due to fever last night of 100.9. He had no further fevers and was discharged to home in fair condition."    Outpatient Medications Prior to Visit  Medication Sig Dispense Refill   acetaminophen (TYLENOL) 500 MG tablet Take 1,000 mg by mouth every 6 (six) hours as needed for headache or moderate pain.     aspirin 81 MG EC tablet Take 81 mg by mouth daily.     atorvastatin (LIPITOR) 40 MG tablet Take 1 tablet (40 mg total) by mouth daily. (Patient taking differently: Take 40 mg by mouth every other day.) 90 tablet 3   benazepril (LOTENSIN) 20 MG tablet TAKE 1 TABLET BY MOUTH  DAILY (Patient taking differently: Take 20 mg by mouth daily.) 90 tablet 3   clopidogrel (PLAVIX) 75 MG tablet Take 1 tablet (75 mg total) by mouth daily. 90 tablet 3   cyanocobalamin 1000 MCG tablet Take 1,000 mcg by mouth daily.     feeding supplement (ENSURE ENLIVE / ENSURE PLUS) LIQD Take 237 mLs by mouth 2 (two) times daily between meals. 237 mL 12   loratadine (CLARITIN) 10 MG tablet Take 10 mg by mouth daily as  needed for allergies or rhinitis.     metFORMIN (GLUCOPHAGE) 500 MG tablet TAKE 1 TABLET BY MOUTH  DAILY WITH BREAKFAST (Patient taking differently: Take 500 mg by mouth daily with breakfast.) 90 tablet 3   metoprolol tartrate (LOPRESSOR) 25 MG tablet Take 0.5 tablets (12.5 mg total) by mouth 2 (two) times daily. 15 tablet 0   Multiple Vitamin (MULTIVITAMIN WITH MINERALS) TABS tablet Take 1 tablet by mouth daily. 30 tablet 0   Nutritional Supplements (,FEEDING SUPPLEMENT, PROSOURCE PLUS) liquid Take 30 mLs by mouth daily. 887 mL 0   tamsulosin (FLOMAX) 0.4 MG CAPS capsule Take 1 capsule (0.4 mg total) by mouth daily. 90 capsule 3   traZODone (DESYREL) 50 MG tablet TAKE 1/2 TO 1 TABLET BY  MOUTH AT BEDTIME AS NEEDED  FOR SLEEP (Patient taking differently: Take 25-50 mg by mouth at bedtime as  needed for sleep.) 90 tablet 3   No facility-administered medications prior to visit.    ROS: Review of Systems  Constitutional:  Negative for appetite change, fatigue and unexpected weight change.  HENT:  Negative for congestion, nosebleeds, sneezing, sore throat and trouble swallowing.   Eyes:  Negative for itching and visual disturbance.  Respiratory:  Negative for cough.   Cardiovascular:  Negative for chest pain, palpitations and leg swelling.  Gastrointestinal:  Positive for abdominal pain (mid urinary pain). Negative for abdominal distention, blood in stool, diarrhea and nausea.  Genitourinary:  Negative for frequency and hematuria.  Musculoskeletal:  Negative for back pain, gait problem, joint swelling and neck pain.  Skin:  Negative for rash.  Neurological:  Negative for dizziness, tremors, speech difficulty and weakness.  Psychiatric/Behavioral:  Negative for agitation, dysphoric mood and sleep disturbance. The patient is not nervous/anxious.    Objective:  BP 112/62 (BP Location: Left Arm)    Pulse 70    Temp 98.3 F (36.8 C) (Oral)    Ht '6\' 3"'  (1.905 m)    Wt 236 lb 9.6 oz (107.3 kg)    SpO2  98%    BMI 29.57 kg/m   BP Readings from Last 3 Encounters:  09/28/21 112/62  08/19/21 (!) 172/76  07/20/21 (!) 146/70    Wt Readings from Last 3 Encounters:  09/28/21 236 lb 9.6 oz (107.3 kg)  08/12/21 230 lb (104.3 kg)  07/20/21 239 lb (108.4 kg)    Physical Exam Constitutional:      General: He is not in acute distress.    Appearance: He is well-developed.     Comments: NAD  Eyes:     Conjunctiva/sclera: Conjunctivae normal.     Pupils: Pupils are equal, round, and reactive to light.  Neck:     Thyroid: No thyromegaly.     Vascular: No JVD.  Cardiovascular:     Rate and Rhythm: Normal rate and regular rhythm.     Heart sounds: Normal heart sounds. No murmur heard.   No friction rub. No gallop.  Pulmonary:     Effort: Pulmonary effort is normal. No respiratory distress.     Breath sounds: Normal breath sounds. No wheezing or rales.  Chest:     Chest wall: No tenderness.  Abdominal:     General: Bowel sounds are normal. There is no distension.     Palpations: Abdomen is soft. There is no mass.     Tenderness: There is no abdominal tenderness. There is no guarding or rebound.  Musculoskeletal:        General: No tenderness. Normal range of motion.     Cervical back: Normal range of motion.  Lymphadenopathy:     Cervical: No cervical adenopathy.  Skin:    General: Skin is warm and dry.     Findings: No rash.  Neurological:     Mental Status: He is alert and oriented to person, place, and time.     Cranial Nerves: No cranial nerve deficit.     Motor: No abnormal muscle tone.     Coordination: Coordination normal.     Gait: Gait abnormal.     Deep Tendon Reflexes: Reflexes are normal and symmetric.  Psychiatric:        Behavior: Behavior normal.        Thought Content: Thought content normal.        Judgment: Judgment normal.  Antalgic gait  Lab Results  Component Value Date   WBC 4.5 08/18/2021  HGB 10.4 (L) 08/18/2021   HCT 32.2 (L) 08/18/2021   PLT  316 08/18/2021   GLUCOSE 104 (H) 08/18/2021   CHOL 131 06/20/2021   TRIG 135 06/20/2021   HDL 38 (L) 06/20/2021   LDLDIRECT 88.1 01/23/2011   LDLCALC 69 06/20/2021   ALT 20 08/12/2021   AST 31 08/12/2021   NA 137 08/18/2021   K 3.7 08/18/2021   CL 106 08/18/2021   CREATININE 0.95 08/18/2021   BUN 8 08/18/2021   CO2 25 08/18/2021   TSH 0.673 08/04/2020   PSA 0.26 07/30/2020   INR 1.5 (H) 08/04/2020   HGBA1C 5.9 03/29/2021   MICROALBUR 2.0 (H) 03/29/2021    DG Chest 2 View  Result Date: 08/12/2021 CLINICAL DATA:  Cough and headache.  Diarrhea.  Fever and weakness. EXAM: CHEST - 2 VIEW COMPARISON:  08/16/2020 FINDINGS: Previous median sternotomy and CABG procedure. Heart size is normal. No pleural effusion or edema. No airspace densities. Spondylosis identified within the thoracic spine with large flowing ventral osteophytes. IMPRESSION: No active cardiopulmonary abnormalities. Electronically Signed   By: Kerby Moors M.D.   On: 08/12/2021 14:55   CT Renal Stone Study  Result Date: 08/12/2021 CLINICAL DATA:  Hematuria. EXAM: CT ABDOMEN AND PELVIS WITHOUT CONTRAST TECHNIQUE: Multidetector CT imaging of the abdomen and pelvis was performed following the standard protocol without IV contrast. COMPARISON:  CT abdomen pelvis report dated 05/02/2000. The images are not available for direct comparison. FINDINGS: Evaluation of this exam is limited in the absence of intravenous contrast. Lower chest: The visualized lung bases are clear. There is coronary vascular calcification. No intra-abdominal free air or free fluid. Hepatobiliary: The liver is unremarkable. No intrahepatic biliary dilatation. Small gallstones. No pericholecystic fluid or evidence of acute cholecystitis by CT. Pancreas: Unremarkable. No pancreatic ductal dilatation or surrounding inflammatory changes. Spleen: Normal in size without focal abnormality. Adrenals/Urinary Tract: The adrenal glands are unremarkable. There is a 9 mm  nonobstructing left renal inferior pole calculus. No hydronephrosis. There is no hydronephrosis or nephrolithiasis on the right. Faint area of hypodensity in the right renal parenchyma is not evaluated but may represent a cyst or focal area of edema or developing abscess. There is right perinephric stranding. Correlation with urinalysis recommended to evaluate for possibility of pyelonephritis. The visualized ureters appear unremarkable. There is diffuse haziness of the bladder wall concerning for cystitis. There is loss of fat plane between the sigmoid colon and bladder dome with air within the urinary bladder most consistent with a colovesical fistula. Stomach/Bowel: There is sigmoid diverticulosis without definite active inflammation. There is no bowel obstruction. The appendix is normal. Vascular/Lymphatic: Moderate aortoiliac atherosclerotic disease. The IVC is unremarkable. No portal venous gas. There is no adenopathy. Reproductive: The prostate and seminal vesicles are grossly unremarkable. No pelvic mass. Other: None Musculoskeletal: Degenerative changes of the spine. No acute osseous pathology. IMPRESSION: 1. Sigmoid diverticulosis with colovesical fistula and findings of UTI and right-sided pyelonephritis. Developing abscess in the right kidney is not excluded. 2. A 9 mm nonobstructing left renal inferior pole calculus. No hydronephrosis. 3. Cholelithiasis. 4. No bowel obstruction. Normal appendix. 5. Aortic Atherosclerosis (ICD10-I70.0). Electronically Signed   By: Anner Crete M.D.   On: 08/12/2021 21:55    Assessment & Plan:   Problem List Items Addressed This Visit     AKI (acute kidney injury) ([place])    Check CMET Off Meloxicam      Relevant Orders   Comprehensive metabolic panel   Urinalysis   Colovesical  fistula    Mr Petrovic was found to have a possible small developing abscess as well as a possible colovesical fistula 12/22 Dr Dema Severin - Surgery  Levaquin po if fever       Relevant Orders   CBC with Differential/Platelet   Urinalysis   LOW BACK PAIN    TENS unit for pain      Relevant Medications   traMADol (ULTRAM) 50 MG tablet   tiZANidine (ZANAFLEX) 4 MG tablet      Meds ordered this encounter  Medications   cefUROXime (CEFTIN) 500 MG tablet    Sig: Take 1 tablet (500 mg total) by mouth 2 (two) times daily with a meal.    Dispense:  28 tablet    Refill:  0   traMADol (ULTRAM) 50 MG tablet    Sig: Take 1 tablet (50 mg total) by mouth every 6 (six) hours as needed for up to 5 days.    Dispense:  120 tablet    Refill:  1   tiZANidine (ZANAFLEX) 4 MG tablet    Sig: Take 1 tablet (4 mg total) by mouth every 8 (eight) hours as needed for muscle spasms.    Dispense:  90 tablet    Refill:  0      Follow-up: No follow-ups on file.  Walker Kehr, MD

## 2021-09-28 NOTE — Assessment & Plan Note (Addendum)
Brian Mccullough was found to have a possible small developing abscess as well as a possible colovesical fistula 12/22 Dr Dema Severin - Surgery  Levaquin po if fever

## 2021-09-28 NOTE — Assessment & Plan Note (Signed)
Check CMET Off Meloxicam

## 2021-09-28 NOTE — Assessment & Plan Note (Signed)
TENS unit for pain

## 2021-09-29 ENCOUNTER — Other Ambulatory Visit: Payer: Medicare Other

## 2021-09-29 DIAGNOSIS — N321 Vesicointestinal fistula: Secondary | ICD-10-CM | POA: Diagnosis not present

## 2021-09-29 DIAGNOSIS — N390 Urinary tract infection, site not specified: Secondary | ICD-10-CM | POA: Diagnosis not present

## 2021-10-01 LAB — URINE CULTURE
MICRO NUMBER:: 12790837
SPECIMEN QUALITY:: ADEQUATE

## 2021-10-04 ENCOUNTER — Other Ambulatory Visit: Payer: Self-pay | Admitting: Cardiovascular Disease

## 2021-10-10 ENCOUNTER — Encounter: Payer: Self-pay | Admitting: Internal Medicine

## 2021-10-11 ENCOUNTER — Encounter: Payer: Self-pay | Admitting: Surgery

## 2021-10-11 ENCOUNTER — Ambulatory Visit: Payer: Self-pay | Admitting: Surgery

## 2021-10-11 DIAGNOSIS — Z86010 Personal history of colonic polyps: Secondary | ICD-10-CM | POA: Diagnosis not present

## 2021-10-11 DIAGNOSIS — B965 Pseudomonas (aeruginosa) (mallei) (pseudomallei) as the cause of diseases classified elsewhere: Secondary | ICD-10-CM | POA: Insufficient documentation

## 2021-10-11 DIAGNOSIS — Z951 Presence of aortocoronary bypass graft: Secondary | ICD-10-CM | POA: Diagnosis not present

## 2021-10-11 DIAGNOSIS — N390 Urinary tract infection, site not specified: Secondary | ICD-10-CM | POA: Diagnosis not present

## 2021-10-11 DIAGNOSIS — N321 Vesicointestinal fistula: Secondary | ICD-10-CM

## 2021-10-11 DIAGNOSIS — R739 Hyperglycemia, unspecified: Secondary | ICD-10-CM

## 2021-10-11 DIAGNOSIS — L910 Hypertrophic scar: Secondary | ICD-10-CM | POA: Diagnosis not present

## 2021-10-12 ENCOUNTER — Telehealth: Payer: Self-pay | Admitting: *Deleted

## 2021-10-12 ENCOUNTER — Other Ambulatory Visit: Payer: Self-pay | Admitting: Physician Assistant

## 2021-10-12 DIAGNOSIS — N3020 Other chronic cystitis without hematuria: Secondary | ICD-10-CM | POA: Diagnosis not present

## 2021-10-12 DIAGNOSIS — N321 Vesicointestinal fistula: Secondary | ICD-10-CM | POA: Diagnosis not present

## 2021-10-12 NOTE — Telephone Encounter (Signed)
Yes he is at low risk of holding ASA 5-7 days before surgery as requested

## 2021-10-12 NOTE — Telephone Encounter (Signed)
° °  Name: Brian Mccullough  DOB: 1951/07/03  MRN: [id number]   Primary Cardiologist: Sherren Mocha, MD  Chart reviewed as part of pre-operative protocol coverage. Patient was contacted 10/12/2021 in reference to pre-operative risk assessment for pending surgery as outlined below.  Brian Mccullough was last seen on 07/01/2021 by Richardson Dopp PA-C.  Since that day, Brian Mccullough has done well without exertional chest pain or worsening dyspnea.  Therefore, based on ACC/AHA guidelines, the patient would be at acceptable risk for the planned procedure without further cardiovascular testing.   Patient may hold aspirin for 5 to 7 days prior to the procedure and restart as soon as possible afterward at the surgeon's discretion.  The patient was advised that if he develops new symptoms prior to surgery to contact our office to arrange for a follow-up visit, and he verbalized understanding.  I will route this recommendation to the requesting party via Epic fax function and remove from pre-op pool. Please call with questions.  [place], [place] 10/12/2021, 6:16 PM

## 2021-10-12 NOTE — Telephone Encounter (Signed)
° °  Pre-operative Risk Assessment    Patient Name: Brian Mccullough  DOB: [birth year] MRN: [id number]      Request for Surgical Clearance    Procedure:   ROBOTIC SIGMOIDECTOMY, TAKE DOWN COLOVESICAL FISTULA  Date of Surgery:  Clearance TBD                                 Surgeon:  DR. Michael Boston Surgeon's Group or Practice Name:  [facility] Phone number:  [phone] Fax number:  [phone] ATTN: Illene Regulus, CMA   Type of Clearance Requested:   - Medical  - Pharmacy:  Hold Aspirin and Clopidogrel (Plavix)     Type of Anesthesia:  General    Additional requests/questions:    Jiles Prows   10/12/2021, 9:59 AM

## 2021-10-12 NOTE — Telephone Encounter (Signed)
Dr. Burt Knack to review.   Patient had CAD s/p CABG in 08/04/2020, he was last seen in Sept by Brian Mccullough who asked him to stop plavix therapy after Oct 2022. For some reason plavix was left on his medication list, however he says he did stop it after Oct of 2022. Therefore I have removed the plavix from his medication list.   Otherwise, he has no probably accomplishing more than 4 METS of activity. He should be cleared from the cardiac clearance. Surgeon's office is requesting [place] to hold his ASA as well. Would you be ok with holding ASA for 5-7 days prior to the surgery?  Please forward your response to P CV DIV PREOP

## 2021-10-20 ENCOUNTER — Other Ambulatory Visit: Payer: Self-pay | Admitting: Urology

## 2021-10-24 ENCOUNTER — Other Ambulatory Visit: Payer: Self-pay | Admitting: Internal Medicine

## 2021-11-08 ENCOUNTER — Telehealth: Payer: Self-pay | Admitting: *Deleted

## 2021-11-08 NOTE — Telephone Encounter (Signed)
Dr Fuller Plan,  This pt is scheduled for a PV 11-22-21 and colon 11-29-21 with you-- he has a hx of polyps- last colon 2018 with 3 yr recall- he has a colovesical fistula closure scheduled for 11-30-21 with [facility]- states coordination colon in the Epic notes -  I wanted to make you aware of this since I dont see any notes from you and make sure he does not need an OV prior to this colon due to his surgery.  Please advise- Thanks   Lelan Pons PV

## 2021-11-08 NOTE — Telephone Encounter (Signed)
Noted- thank you- marie PV

## 2021-11-08 NOTE — Telephone Encounter (Signed)
I have reviewed Dr. Clyda Greener office note and this patient is OK for direct [place] colonoscopy prior to his planned segmental colectomy.

## 2021-11-09 NOTE — Progress Notes (Signed)
Sent message, via epic in basket, requesting orders in epic from surgeon.  

## 2021-11-16 ENCOUNTER — Ambulatory Visit: Payer: Self-pay | Admitting: Surgery

## 2021-11-18 ENCOUNTER — Other Ambulatory Visit (HOSPITAL_COMMUNITY): Payer: Self-pay

## 2021-11-18 NOTE — Progress Notes (Addendum)
COVID swab appointment: 11/28/21 @ 0915  COVID Vaccine Completed: yes x4 Date COVID Vaccine completed: 10/28/19, 11/18/19 Has received booster: 07/21/20, 08/16/21 COVID vaccine manufacturer: [place]      Date of COVID positive in last 90 days: no  PCP - Lew Dawes, MD Cardiologist - Sherren Mocha, MD  Cardiac clearance by Almyra Deforest 10/12/21 in [place]  Chest x-ray - 08/12/21 Epic EKG - 08/16/21 Epic Stress Test - 08/21/13 Epic ECHO - 08/16/21 Epic Cardiac Cath - 08/03/20 Epic Pacemaker/ICD device last checked: n/a Spinal Cord Stimulator: n/a  Bowel Prep - Miralax, Neomycin, Flagyl, Dulcolax, liquid diet day before. Pt having colonoscopy day before, will call surgeons office today  Sleep Study - n/a CPAP -   Fasting Blood Sugar - no checks at home Checks Blood Sugar _____ times a day  Blood Thinner Instructions: Aspirin Instructions: ASA 81, hold 5-7 days Last Dose:  Activity level: Can go up a flight of stairs and perform activities of daily living without stopping and without symptoms of chest pain or shortness of breath.    Anesthesia review: HTN, STEMI, CAD, DM2,   Patient denies shortness of breath, fever, cough and chest pain at PAT appointment   Patient verbalized understanding of instructions that were given to them at the PAT appointment. Patient was also instructed that they will need to review over the PAT instructions again at home before surgery.

## 2021-11-18 NOTE — Patient Instructions (Addendum)
DUE TO COVID-19 ONLY ONE VISITOR IS ALLOWED TO COME WITH YOU AND STAY IN THE WAITING ROOM ONLY DURING PRE OP AND PROCEDURE.   **NO VISITORS ARE ALLOWED IN THE SHORT STAY AREA OR RECOVERY ROOM!!**  IF YOU WILL BE ADMITTED INTO THE HOSPITAL YOU ARE ALLOWED ONLY TWO SUPPORT PEOPLE DURING VISITATION HOURS ONLY (7 AM -8PM)   The support person(s) must pass our screening, gel in and out, and wear a mask at all times, including in the patients room. Patients must also wear a mask when staff or their support person are in the room. Visitors GUEST BADGE MUST BE WORN VISIBLY  One adult visitor may remain with you overnight and MUST be in the room by 8 P.M.  No visitors under the age of 74. Any visitor under the age of 61 must be accompanied by an adult.    COVID SWAB TESTING MUST BE COMPLETED ON:  11/28/21 @ 9:15 AM   Site: [facility].  [place] Enter: Main Entrance have a seat in the waiting area to the right of main entrance (DO NOT [place]!!!!!) Dial: [phone] to alert staff you have arrived  You are not required to quarantine, however you are required to wear a well-fitted mask when you are out and around people not in your household.  Hand Hygiene often Do NOT share personal items Notify your provider if you are in close contact with someone who has COVID or you develop fever 100.4 or greater, new onset of sneezing, cough, sore throat, shortness of breath or body aches.       Your procedure is scheduled on: 11/30/21   Report to [facility] Main Entrance    Report to admitting at 6:15 AM   Call this number if you have problems the morning of surgery [phone]   Follow clear liquid diet the day before surgery   May have liquids until 5:30 AM day of surgery  CLEAR LIQUID DIET  Foods Allowed                                                                     Foods Excluded  Water, Black Coffee and tea, regular and decaf                              liquids that you cannot  Plain Jell-O in any flavor  (No red)                                           see through such as: Fruit ices (not with fruit pulp)                                     milk, soups, orange juice              Iced Popsicles (No red)  All solid food                                   Apple juices Sports drinks like Gatorade (No red) Lightly seasoned clear broth or consume(fat free) Sugar  Sample Menu Breakfast                                Lunch                                     Supper Cranberry juice                    Beef broth                            Chicken broth Jell-O                                     Grape juice                           Apple juice Coffee or tea                        Jell-O                                      Popsicle                                                Coffee or tea                        Coffee or tea       Drink 2 Ensure drinks the night before surgery, have done by 10pm.  Complete one Ensure drink the morning of surgery 3 hours prior to scheduled surgery, have done by 5:30 AM.     The day of surgery:  Drink ONE (1) Pre-Surgery G2 at 5:30 AM the morning of surgery. Drink in one sitting. Do not sip.  This drink was given to you during your hospital  pre-op appointment visit. Nothing else to drink after completing the  Pre-Surgery G2.          If you have questions, please contact your surgeons office.  FOLLOW BOWEL PREP AND ANY ADDITIONAL PRE OP INSTRUCTIONS YOU RECEIVED FROM YOUR SURGEON'S OFFICE!!!     Oral Hygiene is also important to reduce your risk of infection.                                    Remember - BRUSH YOUR TEETH THE MORNING OF SURGERY WITH YOUR REGULAR TOOTHPASTE   Take these medicines the morning of surgery with A SIP OF WATER: Tylenol, Claritin, Metoprolol   DO NOT TAKE ANY ORAL DIABETIC MEDICATIONS DAY  OF YOUR SURGERY  How to  Manage Your Diabetes Before and After Surgery  Why is it important to control my blood sugar before and after surgery? Improving blood sugar levels before and after surgery helps healing and can limit problems. A way of improving blood sugar control is eating a healthy diet by:  Eating less sugar and carbohydrates  Increasing activity/exercise  Talking with your doctor about reaching your blood sugar goals High blood sugars (greater than 180 mg/dL) can raise your risk of infections and slow your recovery, so you will need to focus on controlling your diabetes during the weeks before surgery. Make sure that the doctor who takes care of your diabetes knows about your planned surgery including the date and location.  How do I manage my blood sugar before surgery? Check your blood sugar at least 4 times a day, starting 2 days before surgery, to make sure that the level is not too high or low. Check your blood sugar the morning of your surgery when you wake up and every 2 hours until you get to the Short Stay unit. If your blood sugar is less than 70 mg/dL, you will need to treat for low blood sugar: Do not take insulin. Treat a low blood sugar (less than 70 mg/dL) with  cup of clear juice (cranberry or apple), 4 glucose tablets, OR glucose gel. Recheck blood sugar in 15 minutes after treatment (to make sure it is greater than 70 mg/dL). If your blood sugar is not greater than 70 mg/dL on recheck, call [phone] for further instructions. Report your blood sugar to the short stay nurse when you get to Short Stay.  If you are admitted to the hospital after surgery: Your blood sugar will be checked by the staff and you will probably be given insulin after surgery (instead of oral diabetes medicines) to make sure you have good blood sugar levels. The goal for blood sugar control after surgery is 80-180 mg/dL.   WHAT DO I DO ABOUT MY DIABETES MEDICATION?  Do not take oral diabetes medicines  (pills) the morning of surgery.  THE DAY BEFORE SURGERY, take Metformin as prescribed       THE MORNING OF SURGERY, do not take Metformin.   Reviewed and Endorsed by [facility] Patient Education Committee, August 2015                               You may not have any metal on your body including jewelry, and body piercing             Do not wear lotions, powders, cologne, or deodorant              Men may shave face and neck.   Do not bring valuables to the hospital. [place].   Contacts, dentures or bridgework may not be worn into surgery.   Bring small overnight bag day of surgery.              Please read over the following fact sheets you were given: IF YOU HAVE QUESTIONS ABOUT YOUR PRE-OP INSTRUCTIONS PLEASE CALL [facility] - Preparing for Surgery Before surgery, you can play an important role.  Because skin is not sterile, your skin needs to be as free of germs as  possible.  You can reduce the number of germs on your skin by washing with CHG (chlorahexidine gluconate) soap before surgery.  CHG is an antiseptic cleaner which kills germs and bonds with the skin to continue killing germs even after washing. Please DO NOT use if you have an allergy to CHG or antibacterial soaps.  If your skin becomes reddened/irritated stop using the CHG and inform your nurse when you arrive at Short Stay. Do not shave (including legs and underarms) for at least 48 hours prior to the first CHG shower.  You may shave your face/neck.  Please follow these instructions carefully:  1.  Shower with CHG Soap the night before surgery and the  morning of surgery.  2.  If you choose to wash your hair, wash your hair first as usual with your normal  shampoo.  3.  After you shampoo, rinse your hair and body thoroughly to remove the shampoo.                             4.  Use CHG as you would any other liquid soap.  You can apply chg  directly to the skin and wash.  Gently with a scrungie or clean washcloth.  5.  Apply the CHG Soap to your body ONLY FROM THE NECK DOWN.   Do   not use on face/ open                           Wound or open sores. Avoid contact with eyes, ears mouth and   genitals (private parts).                       Wash face,  Genitals (private parts) with your normal soap.             6.  Wash thoroughly, paying special attention to the area where your    surgery  will be performed.  7.  Thoroughly rinse your body with warm water from the neck down.  8.  DO NOT shower/wash with your normal soap after using and rinsing off the CHG Soap.                9.  Pat yourself dry with a clean towel.            10.  Wear clean pajamas.            11.  Place clean sheets on your bed the night of your first shower and do not  sleep with pets. Day of Surgery : Do not apply any lotions/deodorants the morning of surgery.  Please wear clean clothes to the hospital/surgery center.  FAILURE TO FOLLOW THESE INSTRUCTIONS MAY RESULT IN THE CANCELLATION OF YOUR SURGERY  PATIENT SIGNATURE_________________________________  NURSE SIGNATURE__________________________________  ________________________________________________________________________   Adam Phenix  An incentive spirometer is a tool that can help keep your lungs clear and active. This tool measures how well you are filling your lungs with each breath. Taking long deep breaths may help reverse or decrease the chance of developing breathing (pulmonary) problems (especially infection) following: A long period of time when you are unable to move or be active. BEFORE THE PROCEDURE  If the spirometer includes an indicator to show your best effort, your nurse or respiratory therapist will set it to a desired goal. If possible, sit up straight or lean slightly forward.  Try not to slouch. Hold the incentive spirometer in an upright position. INSTRUCTIONS FOR USE   Sit on the edge of your bed if possible, or sit up as far as you can in bed or on a chair. Hold the incentive spirometer in an upright position. Breathe out normally. Place the mouthpiece in your mouth and seal your lips tightly around it. Breathe in slowly and as deeply as possible, raising the piston or the ball toward the top of the column. Hold your breath for 3-5 seconds or for as long as possible. Allow the piston or ball to fall to the bottom of the column. Remove the mouthpiece from your mouth and breathe out normally. Rest for a few seconds and repeat Steps 1 through 7 at least 10 times every 1-2 hours when you are awake. Take your time and take a few normal breaths between deep breaths. The spirometer may include an indicator to show your best effort. Use the indicator as a goal to work toward during each repetition. After each set of 10 deep breaths, practice coughing to be sure your lungs are clear. If you have an incision (the cut made at the time of surgery), support your incision when coughing by placing a pillow or rolled up towels firmly against it. Once you are able to get out of bed, walk around indoors and cough well. You may stop using the incentive spirometer when instructed by your caregiver.  RISKS AND COMPLICATIONS Take your time so you do not get dizzy or light-headed. If you are in pain, you may need to take or ask for pain medication before doing incentive spirometry. It is harder to take a deep breath if you are having pain. AFTER USE Rest and breathe slowly and easily. It can be helpful to keep track of a log of your progress. Your caregiver can provide you with a simple table to help with this. If you are using the spirometer at home, follow these instructions: [place] IF:  You are having difficultly using the spirometer. You have trouble using the spirometer as often as instructed. Your pain medication is not giving enough relief while using the  spirometer. You develop fever of 100.5 F (38.1 C) or higher. SEEK IMMEDIATE MEDICAL CARE IF:  You cough up bloody sputum that had not been present before. You develop fever of 102 F (38.9 C) or greater. You develop worsening pain at or near the incision site. MAKE SURE YOU:  Understand these instructions. Will watch your condition. Will get help right away if you are not doing well or get worse. Document Released: 02/05/2007 Document Revised: 12/18/2011 Document Reviewed: 04/08/2007 Exit[employer] Patient Information 2014 [employer], [place].   ________________________________________________________________________  WHAT IS A BLOOD TRANSFUSION? Blood Transfusion Information  A transfusion is the replacement of blood or some of its parts. Blood is made up of multiple cells which provide different functions. Red blood cells carry oxygen and are used for blood loss replacement. White blood cells fight against infection. Platelets control bleeding. Plasma helps clot blood. Other blood products are available for specialized needs, such as hemophilia or other clotting disorders. BEFORE THE TRANSFUSION  Who gives blood for transfusions?  Healthy volunteers who are fully evaluated to make sure their blood is safe. This is blood bank blood. Transfusion therapy is the safest it has ever been in the practice of medicine. Before blood is taken from a donor, a complete history is taken to make sure that person has no history  of diseases nor engages in risky social behavior (examples are intravenous drug use or sexual activity with multiple partners). The donor's travel history is screened to minimize risk of transmitting infections, such as malaria. The donated blood is tested for signs of infectious diseases, such as HIV and hepatitis. The blood is then tested to be sure it is compatible with you in order to minimize the chance of a transfusion reaction. If you or a relative donates blood, this is often  done in anticipation of surgery and is not appropriate for emergency situations. It takes many days to process the donated blood. RISKS AND COMPLICATIONS Although transfusion therapy is very safe and saves many lives, the main dangers of transfusion include:  Getting an infectious disease. Developing a transfusion reaction. This is an allergic reaction to something in the blood you were given. Every precaution is taken to prevent this. The decision to have a blood transfusion has been considered carefully by your caregiver before blood is given. Blood is not given unless the benefits outweigh the risks. AFTER THE TRANSFUSION Right after receiving a blood transfusion, you will usually feel much better and more energetic. This is especially true if your red blood cells have gotten low (anemic). The transfusion raises the level of the red blood cells which carry oxygen, and this usually causes an energy increase. The nurse administering the transfusion will monitor you carefully for complications. HOME CARE INSTRUCTIONS  No special instructions are needed after a transfusion. You may find your energy is better. Speak with your caregiver about any limitations on activity for underlying diseases you may have. SEEK MEDICAL CARE IF:  Your condition is not improving after your transfusion. You develop redness or irritation at the intravenous (IV) site. SEEK IMMEDIATE MEDICAL CARE IF:  Any of the following symptoms occur over the next 12 hours: Shaking chills. You have a temperature by mouth above 102 F (38.9 C), not controlled by medicine. Chest, back, or muscle pain. People around you feel you are not acting correctly or are confused. Shortness of breath or difficulty breathing. Dizziness and fainting. You get a rash or develop hives. You have a decrease in urine output. Your urine turns a dark color or changes to pink, red, or brown. Any of the following symptoms occur over the next 10  days: You have a temperature by mouth above 102 F (38.9 C), not controlled by medicine. Shortness of breath. Weakness after normal activity. The white part of the eye turns yellow (jaundice). You have a decrease in the amount of urine or are urinating less often. Your urine turns a dark color or changes to pink, red, or brown. Document Released: 09/22/2000 Document Revised: 12/18/2011 Document Reviewed: 05/11/2008 [facility] Patient Information 2014 [place], [place].  _______________________________________________________________________

## 2021-11-19 ENCOUNTER — Other Ambulatory Visit: Payer: Self-pay | Admitting: Physician Assistant

## 2021-11-21 ENCOUNTER — Encounter (HOSPITAL_COMMUNITY)
Admission: RE | Admit: 2021-11-21 | Discharge: 2021-11-21 | Disposition: A | Discharge status: Home / Self Care | Payer: Medicare Other | Source: Ambulatory Visit | Attending: Surgery | Admitting: Surgery

## 2021-11-21 ENCOUNTER — Encounter (HOSPITAL_COMMUNITY): Payer: Self-pay

## 2021-11-21 ENCOUNTER — Other Ambulatory Visit: Payer: Self-pay

## 2021-11-21 VITALS — BP 150/85 | HR 56 | Temp 98.1°F | Resp 18 | Ht 75.5 in | Wt 236.0 lb

## 2021-11-21 DIAGNOSIS — E1165 Type 2 diabetes mellitus with hyperglycemia: Secondary | ICD-10-CM | POA: Diagnosis not present

## 2021-11-21 DIAGNOSIS — Z7982 Long term (current) use of aspirin: Secondary | ICD-10-CM | POA: Diagnosis not present

## 2021-11-21 DIAGNOSIS — Z01812 Encounter for preprocedural laboratory examination: Secondary | ICD-10-CM | POA: Insufficient documentation

## 2021-11-21 DIAGNOSIS — I2510 Atherosclerotic heart disease of native coronary artery without angina pectoris: Secondary | ICD-10-CM | POA: Diagnosis not present

## 2021-11-21 DIAGNOSIS — I10 Essential (primary) hypertension: Secondary | ICD-10-CM | POA: Insufficient documentation

## 2021-11-21 DIAGNOSIS — Z01818 Encounter for other preprocedural examination: Secondary | ICD-10-CM

## 2021-11-21 DIAGNOSIS — N321 Vesicointestinal fistula: Secondary | ICD-10-CM | POA: Insufficient documentation

## 2021-11-21 DIAGNOSIS — Z87891 Personal history of nicotine dependence: Secondary | ICD-10-CM | POA: Diagnosis not present

## 2021-11-21 DIAGNOSIS — R739 Hyperglycemia, unspecified: Secondary | ICD-10-CM

## 2021-11-21 DIAGNOSIS — Z951 Presence of aortocoronary bypass graft: Secondary | ICD-10-CM | POA: Insufficient documentation

## 2021-11-21 HISTORY — DX: Personal history of urinary calculi: Z87.442

## 2021-11-21 LAB — BASIC METABOLIC PANEL
Anion gap: 6 (ref 5–15)
BUN: 12 mg/dL (ref 8–23)
CO2: 24 mmol/L (ref 22–32)
Calcium: 9.4 mg/dL (ref 8.9–10.3)
Chloride: 108 mmol/L (ref 98–111)
Creatinine, Ser: 0.94 mg/dL (ref 0.61–1.24)
GFR, Estimated: >60 mL/min (ref >60)
Glucose, Bld: 92 mg/dL (ref 70–99)
Potassium: 3.8 mmol/L (ref 3.5–5.1)
Sodium: 138 mmol/L (ref 135–145)

## 2021-11-21 LAB — CBC
HCT: 41.6 % (ref 39.0–52.0)
Hemoglobin: 13.5 g/dL (ref 13.0–17.0)
MCH: 28.7 pg (ref 26.0–34.0)
MCHC: 32.5 g/dL (ref 30.0–36.0)
MCV: 88.3 fL (ref 80.0–100.0)
Platelets: 181 10*3/uL (ref 150–400)
RBC: 4.71 MIL/uL (ref 4.22–5.81)
RDW: 13.1 % (ref 11.5–15.5)
WBC: 6 10*3/uL (ref 4.0–10.5)
nRBC: 0 % (ref 0.0–0.2)

## 2021-11-21 LAB — HEMOGLOBIN A1C
Hgb A1c MFr Bld: 5.2 % (ref 4.8–5.6)
Mean Plasma Glucose: 102.54 mg/dL

## 2021-11-21 LAB — GLUCOSE, CAPILLARY: Glucose-Capillary: 103 mg/dL — ABNORMAL HIGH (ref 70–99)

## 2021-11-21 NOTE — Consult Note (Signed)
[place] Nurse requested for preoperative stoma site marking  Discussed surgical procedure and stoma creation with patient.  Explained role of the [place] nurse team.  Provided the patient with educational booklet and provided samples of pouching options.  Answered patient questions.   Examined patient sitting in order to place the marking in the patient's visual field, away from any creases or abdominal contour issues and within the rectus muscle.  Attempted to mark above the patient's belt line.   Marked for colostomy in the LLQ  __6__ cm to the left of the umbilicus and even with the umbilicus.  Marked for ileostomy in the RLQ  __6__cm to the right of the umbilicus and even with the umbilicus.  Patient's abdomen cleansed with CHG wipes at site markings, allowed to air dry prior to marking.Covered mark with thin film transparent dressing to preserve mark until date of surgery.   [place] Nurse team will follow up with patient after surgery for continue ostomy care and teaching.  Val Riles, RN, MSN, CWOCN, CNS-BC, pager [phone]

## 2021-11-22 ENCOUNTER — Other Ambulatory Visit: Payer: Self-pay

## 2021-11-22 ENCOUNTER — Ambulatory Visit (AMBULATORY_SURGERY_CENTER): Payer: Medicare Other

## 2021-11-22 VITALS — Ht 75.0 in | Wt 239.0 lb

## 2021-11-22 DIAGNOSIS — Z86010 Personal history of colonic polyps: Secondary | ICD-10-CM

## 2021-11-22 MED ORDER — PEG 3350-KCL-NA BICARB-NACL 420 G PO SOLR: 4000.0000 mL | Freq: Once | ORAL | 0 refills | Status: AC

## 2021-11-22 NOTE — Progress Notes (Signed)
°  Patient's pre-visit was done today over the phone with the patient   Name,DOB and address verified.    Patient denies any allergies to Eggs and Soy.   Patient denies any problems with anesthesia/sedation.  Patient denies taking diet pills or blood thinners.   Denies atrial flutter or atrial fib  Denies chronic constipation  No home Oxygen.   Packet of Prep instructions sent by My Chart or mail to patient including a copy of a consent form if by mail-pt is aware.  Patient understands to call us back with any questions or concerns.  Patient is aware of our care-partner policy and [id number] safety protocol.   EMMI education assigned to the patient for the procedure, sent to [place].    Pt is having colonoscopy the day before intestinal surger-this was planned by surgeon so he could have additional data prior to the surgery.  Pt wanted had copy of prep instructions vs my chart will come in 2/14 to pick up on 2nd floor's office

## 2021-11-22 NOTE — Progress Notes (Signed)
Anesthesia Chart Review   Case: [id number] Date/Time: 11/30/21 0815   Procedures:      ROBOTIC RESECTION OF COLON, RECTOSIGMOID, POSSIBLE BLADDER REPAIR     POSSIBLE OSTOMY     RIGID PROCTOSCOPY     POSSIBLE CYSTOSCOPY with FIREFLY INJECTION     CYSTOSCOPY WITH RETROGRADE PYELOGRAM/URETERAL STENT PLACEMENT   Anesthesia type: General   Pre-op diagnosis: COLOVESICAL FISTULA   Location: [facility]OM 02 / [facility]   Surgeons: Michael Boston, MD; Vira Agar, MD       DISCUSSION:71 y.o. former smoker with h/o HTN, DM II, CAD (CABG 07/2020), colovesical fistula scheduled for above procedure 11/30/21 with Dr. Michael Boston and Dr. Terrilee Files.   Per cardiology preoperative evaluation 10/12/2021, "Chart reviewed as part of pre-operative protocol coverage. Patient was contacted 10/12/2021 in reference to pre-operative risk assessment for pending surgery as outlined below.  KAILAN LAWS was last seen on 07/01/2021 by Richardson Dopp PA-C.  Since that day, VALOR QUAINTANCE has done well without exertional chest pain or worsening dyspnea.   Therefore, based on ACC/AHA guidelines, the patient would be at acceptable risk for the planned procedure without further cardiovascular testing.    Patient may hold aspirin for 5 to 7 days prior to the procedure and restart as soon as possible afterward at the surgeon's discretion."  Anticipate pt can proceed with planned procedure barring acute status change.   VS: BP (!) 150/85    Pulse (!) 56    Temp 36.7 C (Oral)    Resp 18    Ht 6' 3.5" (1.918 m)    Wt 107 kg    SpO2 100%    BMI 29.11 kg/m   PROVIDERS: [name], Evie Lacks, MD is PCP   Primary Cardiologist: Sherren Mocha, MD  LABS: Labs reviewed: Acceptable for surgery. (all labs ordered are listed, but only abnormal results are displayed)  Labs Reviewed  GLUCOSE, CAPILLARY - Abnormal; Notable for the following components:      Result Value   Glucose-Capillary 103 (*)    All other components within  normal limits  HEMOGLOBIN I1W  BASIC METABOLIC PANEL  CBC  TYPE AND SCREEN     IMAGES:   EKG:   CV: Echo 08/16/2021 1. Left ventricular ejection fraction, by estimation, is 60 to 65%. The  left ventricle has normal function. The left ventricle has no regional  wall motion abnormalities. Left ventricular diastolic parameters were  normal.   2. Right ventricular systolic function is normal. The right ventricular  size is normal.   3. Left atrial size was mildly dilated.   4. The mitral valve is normal in structure. No evidence of mitral valve  regurgitation. No evidence of mitral stenosis.   5. The aortic valve is normal in structure. Aortic valve regurgitation is  not visualized. No aortic stenosis is present.   6. The inferior vena cava is normal in size with greater than 50%  respiratory variability, suggesting right atrial pressure of 3 mmHg.  Past Medical History:  Diagnosis Date   Allergy    CAD (coronary artery disease)    s/p Inf STEMI 10/21>>POBA to RCA >> s/p CABG (L-LAD, S-PDA, L Radial-OM1/RI, RIMA-RPLA) // post op AF; SVT >> Amiod Rx // Echo 10/21: EF 55-60, no RWMA, GLS -18.5, normal RVSF, mild LAE, trivial MR    Carotid artery disease ([place])    [place] 10/21: R 1-39; L 40-59   History of kidney stones    Hyperactive gag reflex  Hypertension    under control with meds., has been on med. x 30 yr.   Inguinal hernia 01/2014   bilateral   LBP (low back pain)    Non-insulin dependent type 2 diabetes mellitus (HCC)    OA (osteoarthritis) of knee 01/2004   left   Palpitations    s/p Inf STEMI 07/2020   POBA to RCA >> CABG   Vitamin B 12 deficiency     Past Surgical History:  Procedure Laterality Date   CARDIAC CATHETERIZATION     CORONARY ARTERY BYPASS GRAFT N/A 08/04/2020   Procedure: CORONARY ARTERY BYPASS GRAFTING (CABG), ON PUMP, TIMES SIX, USING BILATERAL INTERNAL MAMMARY ARTERIES, RIGHT ENDOSCOPICALLY HARVESTED GREATER SAPHENOUS VEIN, AND RIGHT RADIAL  ARTERY OPEN HARVEST;  Surgeon: Wonda Olds, MD;  Location: [facility];  Service: Open Heart Surgery;  Laterality: N/A;   CORONARY/GRAFT ACUTE MI REVASCULARIZATION N/A 08/03/2020   Procedure: Coronary/Graft Acute MI Revascularization;  Surgeon: Sherren Mocha, MD;  Location: [facility];  Service: Cardiovascular;  Laterality: N/A;   ENDOVEIN HARVEST OF GREATER SAPHENOUS VEIN Right 08/04/2020   Procedure: ENDOVEIN HARVEST OF GREATER SAPHENOUS VEIN;  Surgeon: Wonda Olds, MD;  Location: [facility];  Service: Open Heart Surgery;  Laterality: Right;   INGUINAL HERNIA REPAIR  age [age]   INGUINAL HERNIA REPAIR Bilateral 02/09/2014   Procedure: OPEN BILATERAL INGUINAL HERNIA REPAIR WITH MESH;  Surgeon: [name], MD;  Location: [facility];  Service: General;  Laterality: Bilateral;   INSERTION OF MESH Bilateral 02/09/2014   Procedure: INSERTION OF MESH;  Surgeon: [name], MD;  Location: [place] [facility];  Service: General;  Laterality: Bilateral;   KNEE ARTHROSCOPY Right    LEFT HEART CATH AND CORONARY ANGIOGRAPHY N/A 08/03/2020   Procedure: LEFT HEART CATH AND CORONARY ANGIOGRAPHY;  Surgeon: Sherren Mocha, MD;  Location: [facility];  Service: Cardiovascular;  Laterality: N/A;   ORCHIECTOMY Left 05/31/2017   Procedure: LEFT RADICAL ORCHIECTOMY;  Surgeon: Irine Seal, MD;  Location: [facility];  Service: Urology;  Laterality: Left;   RADIAL ARTERY HARVEST Right 08/04/2020   Procedure: RADIAL ARTERY HARVEST;  Surgeon: Wonda Olds, MD;  Location: [facility];  Service: Open Heart Surgery;  Laterality: Right;   TEE WITHOUT CARDIOVERSION N/A 08/04/2020   Procedure: TRANSESOPHAGEAL ECHOCARDIOGRAM (TEE);  Surgeon: Wonda Olds, MD;  Location: [facility];  Service: Open Heart Surgery;  Laterality: N/A;   TONSILLECTOMY AND ADENOIDECTOMY  1960   TOTAL KNEE ARTHROPLASTY Left 01/14/2004    MEDICATIONS:  acetaminophen (TYLENOL) 500 MG tablet    aspirin 81 MG EC tablet   atorvastatin (LIPITOR) 40 MG tablet   benazepril (LOTENSIN) 20 MG tablet   cefUROXime (CEFTIN) 500 MG tablet   cholecalciferol (VITAMIN D3) 25 MCG (1000 UNIT) tablet   cyanocobalamin 1000 MCG tablet   feeding supplement (ENSURE ENLIVE / ENSURE PLUS) LIQD   furosemide (LASIX) 20 MG tablet   loratadine (CLARITIN) 10 MG tablet   meloxicam (MOBIC) 15 MG tablet   metFORMIN (GLUCOPHAGE) 500 MG tablet   metoprolol tartrate (LOPRESSOR) 25 MG tablet   Multiple Vitamin (MULTIVITAMIN WITH MINERALS) TABS tablet   Nutritional Supplements (,FEEDING SUPPLEMENT, PROSOURCE PLUS) liquid   tamsulosin (FLOMAX) 0.4 MG CAPS capsule   tiZANidine (ZANAFLEX) 4 MG tablet   traZODone (DESYREL) 50 MG tablet   triamcinolone ointment (KENALOG) 0.1 %   No current facility-administered medications for this encounter.    Konrad Felix Ward, PA-C [facility]  Pre-Surgical Testing [phone]

## 2021-11-23 ENCOUNTER — Telehealth: Payer: Self-pay | Admitting: Gastroenterology

## 2021-11-23 NOTE — Telephone Encounter (Signed)
Inbound call from patient, stated that he went to pharmacy to get prep medication and they did not have the proscription. Please advise.

## 2021-11-24 NOTE — Telephone Encounter (Signed)
Pt here at office and states pharmacy hasn't received RX for golytely.  I called pt's pharmacy and couldn't reach anyone to speak to- I left a message with pt's name, DOB, and prescription information for Golytely.  Pt is aware this was called in.

## 2021-11-25 ENCOUNTER — Encounter: Payer: Self-pay | Admitting: Gastroenterology

## 2021-11-28 ENCOUNTER — Encounter (HOSPITAL_COMMUNITY)
Admission: RE | Admit: 2021-11-28 | Discharge: 2021-11-28 | Disposition: A | Discharge status: Home / Self Care | Payer: Medicare Other | Source: Ambulatory Visit | Attending: Surgery | Admitting: Surgery

## 2021-11-28 ENCOUNTER — Other Ambulatory Visit: Payer: Self-pay

## 2021-11-28 DIAGNOSIS — Z881 Allergy status to other antibiotic agents status: Secondary | ICD-10-CM | POA: Diagnosis not present

## 2021-11-28 DIAGNOSIS — I10 Essential (primary) hypertension: Secondary | ICD-10-CM | POA: Diagnosis not present

## 2021-11-28 DIAGNOSIS — Z01812 Encounter for preprocedural laboratory examination: Secondary | ICD-10-CM

## 2021-11-28 DIAGNOSIS — Z833 Family history of diabetes mellitus: Secondary | ICD-10-CM | POA: Diagnosis not present

## 2021-11-28 DIAGNOSIS — Z20822 Contact with and (suspected) exposure to covid-19: Secondary | ICD-10-CM | POA: Insufficient documentation

## 2021-11-28 DIAGNOSIS — Z791 Long term (current) use of non-steroidal anti-inflammatories (NSAID): Secondary | ICD-10-CM | POA: Diagnosis not present

## 2021-11-28 DIAGNOSIS — Z79899 Other long term (current) drug therapy: Secondary | ICD-10-CM | POA: Diagnosis not present

## 2021-11-28 DIAGNOSIS — N321 Vesicointestinal fistula: Secondary | ICD-10-CM | POA: Diagnosis not present

## 2021-11-28 DIAGNOSIS — Z87891 Personal history of nicotine dependence: Secondary | ICD-10-CM | POA: Diagnosis not present

## 2021-11-28 DIAGNOSIS — Z951 Presence of aortocoronary bypass graft: Secondary | ICD-10-CM | POA: Diagnosis not present

## 2021-11-28 DIAGNOSIS — Z86010 Personal history of colonic polyps: Secondary | ICD-10-CM | POA: Diagnosis not present

## 2021-11-28 DIAGNOSIS — E559 Vitamin D deficiency, unspecified: Secondary | ICD-10-CM | POA: Diagnosis not present

## 2021-11-28 DIAGNOSIS — L910 Hypertrophic scar: Secondary | ICD-10-CM | POA: Diagnosis not present

## 2021-11-28 DIAGNOSIS — E538 Deficiency of other specified B group vitamins: Secondary | ICD-10-CM | POA: Diagnosis not present

## 2021-11-28 DIAGNOSIS — Z803 Family history of malignant neoplasm of breast: Secondary | ICD-10-CM | POA: Diagnosis not present

## 2021-11-28 DIAGNOSIS — N400 Enlarged prostate without lower urinary tract symptoms: Secondary | ICD-10-CM | POA: Diagnosis present

## 2021-11-28 DIAGNOSIS — Z96652 Presence of left artificial knee joint: Secondary | ICD-10-CM | POA: Diagnosis not present

## 2021-11-28 DIAGNOSIS — E785 Hyperlipidemia, unspecified: Secondary | ICD-10-CM | POA: Diagnosis not present

## 2021-11-28 DIAGNOSIS — I4891 Unspecified atrial fibrillation: Secondary | ICD-10-CM | POA: Diagnosis not present

## 2021-11-28 DIAGNOSIS — E119 Type 2 diabetes mellitus without complications: Secondary | ICD-10-CM | POA: Diagnosis not present

## 2021-11-28 DIAGNOSIS — K5732 Diverticulitis of large intestine without perforation or abscess without bleeding: Secondary | ICD-10-CM | POA: Diagnosis not present

## 2021-11-28 DIAGNOSIS — M1990 Unspecified osteoarthritis, unspecified site: Secondary | ICD-10-CM | POA: Diagnosis not present

## 2021-11-28 DIAGNOSIS — K660 Peritoneal adhesions (postprocedural) (postinfection): Secondary | ICD-10-CM | POA: Diagnosis not present

## 2021-11-28 DIAGNOSIS — I252 Old myocardial infarction: Secondary | ICD-10-CM | POA: Diagnosis not present

## 2021-11-28 DIAGNOSIS — Z885 Allergy status to narcotic agent status: Secondary | ICD-10-CM | POA: Diagnosis not present

## 2021-11-28 DIAGNOSIS — Z8249 Family history of ischemic heart disease and other diseases of the circulatory system: Secondary | ICD-10-CM | POA: Diagnosis not present

## 2021-11-28 DIAGNOSIS — K4090 Unilateral inguinal hernia, without obstruction or gangrene, not specified as recurrent: Secondary | ICD-10-CM | POA: Diagnosis not present

## 2021-11-28 DIAGNOSIS — D123 Benign neoplasm of transverse colon: Secondary | ICD-10-CM | POA: Diagnosis not present

## 2021-11-28 DIAGNOSIS — Z7982 Long term (current) use of aspirin: Secondary | ICD-10-CM | POA: Diagnosis not present

## 2021-11-28 LAB — SARS CORONAVIRUS 2 (TAT 6-24 HRS): SARS Coronavirus 2: NEGATIVE

## 2021-11-29 ENCOUNTER — Ambulatory Visit (AMBULATORY_SURGERY_CENTER): Payer: Medicare Other | Admitting: Gastroenterology

## 2021-11-29 ENCOUNTER — Encounter: Payer: Self-pay | Admitting: Gastroenterology

## 2021-11-29 VITALS — BP 138/64 | HR 86 | Temp 97.8°F | Resp 17 | Ht 75.0 in | Wt 239.0 lb

## 2021-11-29 DIAGNOSIS — D123 Benign neoplasm of transverse colon: Secondary | ICD-10-CM

## 2021-11-29 DIAGNOSIS — E785 Hyperlipidemia, unspecified: Secondary | ICD-10-CM | POA: Diagnosis not present

## 2021-11-29 DIAGNOSIS — Z86010 Personal history of colonic polyps: Secondary | ICD-10-CM | POA: Diagnosis not present

## 2021-11-29 MED ORDER — SODIUM CHLORIDE 0.9 % IV SOLN: 500.0000 mL | Freq: Once | INTRAVENOUS | Status: DC

## 2021-11-29 NOTE — Progress Notes (Signed)
Vitals-AG  Pt's states no medical or surgical changes since previsit or office visit.

## 2021-11-29 NOTE — Op Note (Signed)
[facility] Patient Name: Brian Mccullough Procedure Date: 11/29/2021 12:26 PM MRN: [id number] Endoscopist: Ladene Artist , MD Age: 71 Referring MD:  Date of Birth: 09/07/1951 Gender: Male Account #: [id number] Procedure:                Colonoscopy Indications:              Surveillance: Personal history of adenomatous                            polyps on last colonoscopy 5 years ago Medicines:                Monitored Anesthesia Care Procedure:                Pre-Anesthesia Assessment:                           - Prior to the procedure, a History and Physical                            was performed, and patient medications and                            allergies were reviewed. The patient's tolerance of                            previous anesthesia was also reviewed. The risks                            and benefits of the procedure and the sedation                            options and risks were discussed with the patient.                            All questions were answered, and informed consent                            was obtained. Prior Anticoagulants: The patient has                            taken no previous anticoagulant or antiplatelet                            agents. ASA Grade Assessment: III - A patient with                            severe systemic disease. After reviewing the risks                            and benefits, the patient was deemed in                            satisfactory condition to undergo the procedure.  After obtaining informed consent, the colonoscope                            was passed under direct vision. Throughout the                            procedure, the patient's blood pressure, pulse, and                            oxygen saturations were monitored continuously. The                            CF [id number] #[id number] was introduced through the anus                            and advanced to the  the cecum, identified by                            appendiceal orifice and ileocecal valve. The                            ileocecal valve, appendiceal orifice, and rectum                            were photographed. The quality of the bowel                            preparation was good. The colonoscopy was performed                            without difficulty. The patient tolerated the                            procedure well. Scope In: 1:45:28 PM Scope Out: 2:03:12 PM Scope Withdrawal Time: 0 hours 10 minutes 25 seconds  Total Procedure Duration: 0 hours 17 minutes 44 seconds  Findings:                 The perianal and digital rectal examinations were                            normal.                           A 6 mm polyp was found in the transverse colon. The                            polyp was sessile. The polyp was removed with a                            cold snare. Resection and retrieval were complete.                           Multiple medium-mouthed diverticula were found in  the left colon. There was narrowing of the colon in                            association with the diverticular opening. Purulent                            discharge was seen in association with the                            diverticular opening, suspicious of diverticulitis                            and fistula. There was no evidence of diverticular                            bleeding.                           The exam was otherwise without abnormality on                            direct and retroflexion views. Complications:            No immediate complications. Estimated blood loss:                            None. Estimated Blood Loss:     Estimated blood loss: none. Impression:               - One 6 mm polyp in the transverse colon, removed                            with a cold snare. Resected and retrieved.                           - Moderate  diverticulosis in the left colon with                            stenosis, suspicious of diverticulitis and fistula.                           - The examination was otherwise normal on direct                            and retroflexion views. Recommendation:           - Repeat colonoscopy after studies are complete for                            surveillance based on pathology results.                           - Patient has a contact number available for                            emergencies. The signs and  symptoms of potential                            delayed complications were discussed with the                            patient. Return to normal activities tomorrow.                            Written discharge instructions were provided to the                            patient.                           - Continue clear liquids for surgery tomorrow as                            planned.                           - Continue present medications.                           - Await pathology results. Ladene Artist, MD 11/29/2021 2:09:42 PM This report has been signed electronically.

## 2021-11-29 NOTE — Patient Instructions (Signed)
YOU HAD AN ENDOSCOPIC PROCEDURE TODAY AT [facility]:   Refer to the procedure report that was given to you for any specific questions about what was found during the examination.  If the procedure report does not answer your questions, please call your gastroenterologist to clarify.  If you requested that your care partner not be given the details of your procedure findings, then the procedure report has been included in a sealed envelope for you to review at your convenience later.  YOU SHOULD EXPECT: Some feelings of bloating in the abdomen. Passage of more gas than usual.  Walking can help get rid of the air that was put into your GI tract during the procedure and reduce the bloating. If you had a lower endoscopy (such as a colonoscopy or flexible sigmoidoscopy) you may notice spotting of blood in your stool or on the toilet paper. If you underwent a bowel prep for your procedure, you may not have a normal bowel movement for a few days.  Please Note:  You might notice some irritation and congestion in your nose or some drainage.  This is from the oxygen used during your procedure.  There is no need for concern and it should clear up in a day or so.  SYMPTOMS TO REPORT IMMEDIATELY:  Following lower endoscopy (colonoscopy or flexible sigmoidoscopy):  Excessive amounts of blood in the stool  Significant tenderness or worsening of abdominal pains  Swelling of the abdomen that is new, acute  Fever of 100F or higher  For urgent or emergent issues, a gastroenterologist can be reached at any hour by calling [phone]. Do not use MyChart messaging for urgent concerns.    DIET:  Clear liquid diet in preparation for surgery tomorrow.  Drink plenty of fluids but you should avoid alcoholic beverages for 24 hours.  ACTIVITY:  You should plan to take it easy for the rest of today and you should NOT DRIVE or use heavy machinery until tomorrow (because of the sedation medicines used  during the test).    FOLLOW UP: Our staff will call the number listed on your records 48-72 hours following your procedure to check on you and address any questions or concerns that you may have regarding the information given to you following your procedure. If we do not reach you, we will leave a message.  We will attempt to reach you two times.  During this call, we will ask if you have developed any symptoms of COVID 19. If you develop any symptoms (ie: fever, flu-like symptoms, shortness of breath, cough etc.) before then, please call [phone].  If you test positive for Covid 19 in the 2 weeks post procedure, please call and report this information to [place].    If any biopsies were taken you will be contacted by phone or by letter within the next 1-3 weeks.  Please call us at [phone] if you have not heard about the biopsies in 3 weeks.    SIGNATURES/CONFIDENTIALITY: You and/or your care partner have signed paperwork which will be entered into your electronic medical record.  These signatures attest to the fact that that the information above on your After Visit Summary has been reviewed and is understood.  Full responsibility of the confidentiality of this discharge information lies with you and/or your care-partner.

## 2021-11-29 NOTE — Progress Notes (Signed)
History & Physical  Primary Care Physician:  Cassandria Anger, MD Primary Gastroenterologist: Lucio Edward, MD  CHIEF COMPLAINT:  Personal history of colon polyps   HPI: Brian Mccullough is a 71 y.o. male with a personal history of adenomatous colon polyps and a colovesical fistula who is scheduled for segmental colectomy tomorrow here for colonoscopy today.   Past Medical History:  Diagnosis Date   Allergy    CAD (coronary artery disease)    s/p Inf STEMI 10/21>>POBA to RCA >> s/p CABG (L-LAD, S-PDA, L Radial-OM1/RI, RIMA-RPLA) // post op AF; SVT >> Amiod Rx // Echo 10/21: EF 55-60, no RWMA, GLS -18.5, normal RVSF, mild LAE, trivial MR    Carotid artery disease ([place])    [place] 10/21: R 1-39; L 40-59   History of kidney stones    Hyperactive gag reflex    Hyperlipidemia    Hypertension    under control with meds., has been on med. x 30 yr.   Inguinal hernia 01/2014   bilateral   LBP (low back pain)    Non-insulin dependent type 2 diabetes mellitus (HCC)    OA (osteoarthritis) of knee 01/2004   left   Palpitations    s/p Inf STEMI 07/2020   POBA to RCA >> CABG   Vitamin B 12 deficiency     Past Surgical History:  Procedure Laterality Date   CARDIAC CATHETERIZATION     CORONARY ARTERY BYPASS GRAFT N/A 08/04/2020   Procedure: CORONARY ARTERY BYPASS GRAFTING (CABG), ON PUMP, TIMES SIX, USING BILATERAL INTERNAL MAMMARY ARTERIES, RIGHT ENDOSCOPICALLY HARVESTED GREATER SAPHENOUS VEIN, AND RIGHT RADIAL ARTERY OPEN HARVEST;  Surgeon: Wonda Olds, MD;  Location: [facility];  Service: Open Heart Surgery;  Laterality: N/A;   CORONARY/GRAFT ACUTE MI REVASCULARIZATION N/A 08/03/2020   Procedure: Coronary/Graft Acute MI Revascularization;  Surgeon: Sherren Mocha, MD;  Location: [facility];  Service: Cardiovascular;  Laterality: N/A;   ENDOVEIN HARVEST OF GREATER SAPHENOUS VEIN Right 08/04/2020   Procedure: ENDOVEIN HARVEST OF GREATER SAPHENOUS VEIN;  Surgeon: Wonda Olds, MD;  Location: [facility];  Service: Open Heart Surgery;  Laterality: Right;   INGUINAL HERNIA REPAIR  age [age]   INGUINAL HERNIA REPAIR Bilateral 02/09/2014   Procedure: OPEN BILATERAL INGUINAL HERNIA REPAIR WITH MESH;  Surgeon: [name], MD;  Location: [facility];  Service: General;  Laterality: Bilateral;   INSERTION OF MESH Bilateral 02/09/2014   Procedure: INSERTION OF MESH;  Surgeon: [name], MD;  Location: [facility];  Service: General;  Laterality: Bilateral;   KNEE ARTHROSCOPY Right    LEFT HEART CATH AND CORONARY ANGIOGRAPHY N/A 08/03/2020   Procedure: LEFT HEART CATH AND CORONARY ANGIOGRAPHY;  Surgeon: Sherren Mocha, MD;  Location: [facility];  Service: Cardiovascular;  Laterality: N/A;   ORCHIECTOMY Left 05/31/2017   Procedure: LEFT RADICAL ORCHIECTOMY;  Surgeon: Irine Seal, MD;  [facility];  Service: Urology;  Laterality: Left;   RADIAL ARTERY HARVEST Right 08/04/2020   Procedure: RADIAL ARTERY HARVEST;  Surgeon: Wonda Olds, MD;  Location: [facility];  Service: Open Heart Surgery;  Laterality: Right;   TEE WITHOUT CARDIOVERSION N/A 08/04/2020   Procedure: TRANSESOPHAGEAL ECHOCARDIOGRAM (TEE);  Surgeon: Wonda Olds, MD;  Location: [facility];  Service: Open Heart Surgery;  Laterality: N/A;   TONSILLECTOMY AND ADENOIDECTOMY  1960   TOTAL KNEE ARTHROPLASTY Left 01/14/2004    Prior to Admission medications   Medication Sig  Start Date End Date Taking? Authorizing Provider  acetaminophen (TYLENOL) 500 MG tablet Take 1,000 mg by mouth every 6 (six) hours as needed for headache or moderate pain.   Yes [provider]  aspirin 81 MG EC tablet Take 81 mg by mouth daily.   Yes [provider]  atorvastatin (LIPITOR) 40 MG tablet Take 1 tablet (40 mg total) by mouth daily. Patient taking differently: Take 40 mg by mouth every other day. 03/16/21  Yes Weaver, Scott T, PA-C  benazepril  (LOTENSIN) 20 MG tablet TAKE 1 TABLET BY MOUTH  DAILY 06/03/21  Yes [name], Evie Lacks, MD  cefUROXime (CEFTIN) 500 MG tablet Take 1 tablet (500 mg total) by mouth 2 (two) times daily with a meal. Patient taking differently: Take 500 mg by mouth at bedtime. 09/28/21  Yes [name], Evie Lacks, MD  cholecalciferol (VITAMIN D3) 25 MCG (1000 UNIT) tablet Take 1,000 Units by mouth daily.   Yes [provider]  cyanocobalamin 1000 MCG tablet Take 1,000 mcg by mouth daily.   Yes [provider]  feeding supplement (ENSURE ENLIVE / ENSURE PLUS) LIQD Take 237 mLs by mouth 2 (two) times daily between meals. 08/19/21  Yes Swayze, Ava, DO  furosemide (LASIX) 20 MG tablet TAKE 1 TABLET BY MOUTH AS NEEDED FOR FLUID OR EDEMA 11/21/21  Yes Kathlen Mody, Scott T, PA-C  loratadine (CLARITIN) 10 MG tablet Take 10 mg by mouth daily as needed for allergies or rhinitis.   Yes [provider]  metFORMIN (GLUCOPHAGE) 500 MG tablet TAKE 1 TABLET BY MOUTH  DAILY WITH BREAKFAST 06/07/21  Yes [name], Evie Lacks, MD  metoprolol tartrate (LOPRESSOR) 25 MG tablet Take 0.5 tablets (12.5 mg total) by mouth 2 (two) times daily. 08/19/21  Yes Swayze, Ava, DO  Multiple Vitamin (MULTIVITAMIN WITH MINERALS) TABS tablet Take 1 tablet by mouth daily. 08/20/21  Yes Swayze, Ava, DO  Nutritional Supplements (,FEEDING SUPPLEMENT, PROSOURCE PLUS) liquid Take 30 mLs by mouth daily. 08/19/21  Yes Swayze, Ava, DO  triamcinolone ointment (KENALOG) 0.1 % Apply 1 application topically daily as needed (rash).   Yes [provider]  meloxicam (MOBIC) 15 MG tablet TAKE 1 TABLET BY MOUTH  DAILY AS NEEDED FOR PAIN 10/25/21   [name], Evie Lacks, MD  tamsulosin (FLOMAX) 0.4 MG CAPS capsule Take 1 capsule (0.4 mg total) by mouth daily. Patient not taking: Reported on 11/15/2021 07/20/21   Biagio Borg, MD  tiZANidine (ZANAFLEX) 4 MG tablet Take 1 tablet (4 mg total) by mouth every 8 (eight) hours as needed for muscle  spasms. Patient not taking: Reported on 11/22/2021 09/28/21   [name], Evie Lacks, MD  traZODone (DESYREL) 50 MG tablet TAKE 1/2 TO 1 TABLET BY  MOUTH AT BEDTIME AS NEEDED  FOR SLEEP Patient not taking: Reported on 11/22/2021 06/03/21   [name], Evie Lacks, MD    Current Outpatient Medications  Medication Sig Dispense Refill   acetaminophen (TYLENOL) 500 MG tablet Take 1,000 mg by mouth every 6 (six) hours as needed for headache or moderate pain.     aspirin 81 MG EC tablet Take 81 mg by mouth daily.     atorvastatin (LIPITOR) 40 MG tablet Take 1 tablet (40 mg total) by mouth daily. (Patient taking differently: Take 40 mg by mouth every other day.) 90 tablet 3   benazepril (LOTENSIN) 20 MG tablet TAKE 1 TABLET BY MOUTH  DAILY 90 tablet 3   cefUROXime (CEFTIN) 500 MG tablet Take 1 tablet (500 mg total) by  mouth 2 (two) times daily with a meal. (Patient taking differently: Take 500 mg by mouth at bedtime.) 28 tablet 0   cholecalciferol (VITAMIN D3) 25 MCG (1000 UNIT) tablet Take 1,000 Units by mouth daily.     cyanocobalamin 1000 MCG tablet Take 1,000 mcg by mouth daily.     feeding supplement (ENSURE ENLIVE / ENSURE PLUS) LIQD Take 237 mLs by mouth 2 (two) times daily between meals. 237 mL 12   furosemide (LASIX) 20 MG tablet TAKE 1 TABLET BY MOUTH AS NEEDED FOR FLUID OR EDEMA 90 tablet 2   loratadine (CLARITIN) 10 MG tablet Take 10 mg by mouth daily as needed for allergies or rhinitis.     metFORMIN (GLUCOPHAGE) 500 MG tablet TAKE 1 TABLET BY MOUTH  DAILY WITH BREAKFAST 90 tablet 3   metoprolol tartrate (LOPRESSOR) 25 MG tablet Take 0.5 tablets (12.5 mg total) by mouth 2 (two) times daily. 15 tablet 0   Multiple Vitamin (MULTIVITAMIN WITH MINERALS) TABS tablet Take 1 tablet by mouth daily. 30 tablet 0   Nutritional Supplements (,FEEDING SUPPLEMENT, PROSOURCE PLUS) liquid Take 30 mLs by mouth daily. 887 mL 0   triamcinolone ointment (KENALOG) 0.1 % Apply 1 application topically daily as needed  (rash).     meloxicam (MOBIC) 15 MG tablet TAKE 1 TABLET BY MOUTH  DAILY AS NEEDED FOR PAIN 90 tablet 3   tamsulosin (FLOMAX) 0.4 MG CAPS capsule Take 1 capsule (0.4 mg total) by mouth daily. (Patient not taking: Reported on 11/15/2021) 90 capsule 3   tiZANidine (ZANAFLEX) 4 MG tablet Take 1 tablet (4 mg total) by mouth every 8 (eight) hours as needed for muscle spasms. (Patient not taking: Reported on 11/22/2021) 90 tablet 0   traZODone (DESYREL) 50 MG tablet TAKE 1/2 TO 1 TABLET BY  MOUTH AT BEDTIME AS NEEDED  FOR SLEEP (Patient not taking: Reported on 11/22/2021) 90 tablet 3   Current Facility-Administered Medications  Medication Dose Route Frequency Provider Last Rate Last Admin   0.9 %  sodium chloride infusion  500 mL Intravenous Once Ladene Artist, MD        Allergies as of 11/29/2021 - Review Complete 11/29/2021  Allergen Reaction Noted   Codeine Nausea Only 12/06/2009   Levaquin [levofloxacin]  09/28/2021    Family History  Problem Relation Age of Onset   Hypertension Mother    Diabetes Mother    Diabetes Father    Colon cancer Neg Hx    Esophageal cancer Neg Hx    Rectal cancer Neg Hx    Stomach cancer Neg Hx    Colon polyps Neg Hx     Social History   Socioeconomic History   Marital status: Married    Spouse name: Not on file   Number of children: Not on file   Years of education: 12   Highest education level: Not on file  Occupational History   Occupation: diabled    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  Tobacco Use   Smoking status: Former    Types: Cigars    Quit date: 08/03/2020    Years since quitting: 1.3   Smokeless tobacco: Never   Tobacco comments:    2-3 small cigars/day  Vaping Use   Vaping Use: Never used  Substance and Sexual Activity   Alcohol use: No   Drug use: No   Sexual activity: Yes  Other Topics Concern   Not on file  Social History Narrative   Not on  file   Social Determinants of Health   Financial Resource Strain:  Low Risk    Difficulty of Paying Living Expenses: Not hard at all  Food Insecurity: No Food Insecurity   Worried About Charity fundraiser in the Last Year: Never true   Arboriculturist in the Last Year: Never true  Transportation Needs: No Transportation Needs   Lack of Transportation (Medical): No   Lack of Transportation (Non-Medical): No  Physical Activity: Sufficiently Active   Days of Exercise per Week: 5 days   Minutes of Exercise per Session: 30 min  Stress: No Stress Concern Present   Feeling of Stress : Not at all  Social Connections: Moderately Isolated   Frequency of Communication with Friends and Family: More than three times a week   Frequency of Social Gatherings with Friends and Family: More than three times a week   Attends Religious Services: Never   Marine scientist or Organizations: No   Attends Music therapist: Never   Marital Status: Married  Human resources officer Violence: Not At Risk   Fear of Current or Ex-Partner: No   Emotionally Abused: No   Physically Abused: No   Sexually Abused: No    Review of Systems:  All systems reviewed an negative except where noted in HPI.  Gen: Denies any fever, chills, sweats, anorexia, fatigue, weakness, malaise, weight loss, and sleep disorder CV: Denies chest pain, angina, palpitations, syncope, orthopnea, PND, peripheral edema, and claudication. Resp: Denies dyspnea at rest, dyspnea with exercise, cough, sputum, wheezing, coughing up blood, and pleurisy. GI: Denies vomiting blood, jaundice, and fecal incontinence.   Denies dysphagia or odynophagia. GU : Denies urinary burning, blood in urine, urinary frequency, urinary hesitancy, nocturnal urination, and urinary incontinence. MS: Denies joint pain, limitation of movement, and swelling, stiffness, low back pain, extremity pain. Denies muscle weakness, cramps, atrophy.  Derm: Denies rash, itching, dry skin, hives, moles, warts, or unhealing ulcers.  Psych:  Denies depression, anxiety, memory loss, suicidal ideation, hallucinations, paranoia, and confusion. Heme: Denies bruising, bleeding, and enlarged lymph nodes. Neuro:  Denies any headaches, dizziness, paresthesias. Endo:  Denies any problems with DM, thyroid, adrenal function.   Physical Exam: General:  Alert, well-developed, in NAD Head:  Normocephalic and atraumatic. Eyes:  Sclera clear, no icterus.   Conjunctiva pink. Ears:  Normal auditory acuity. Mouth:  No deformity or lesions.  Neck:  Supple; no masses . Lungs:  Clear throughout to auscultation.   No wheezes, crackles, or rhonchi. No acute distress. Heart:  Regular rate and rhythm; no murmurs. Abdomen:  Soft, nondistended, nontender. No masses, hepatomegaly. No obvious masses.  Normal bowel .    Rectal:  Deferred   Msk:  Symmetrical without gross deformities.. Pulses:  Normal pulses noted. Extremities:  Without edema. Neurologic:  Alert and  oriented x4;  grossly normal neurologically. Skin:  Intact without significant lesions or rashes. Cervical Nodes:  No significant cervical adenopathy. Psych:  Alert and cooperative. Normal mood and affect.   Impression / Plan:   Personal history of adenomatous colon polyps and a colovesical fistula with segmental colectomy scheduled for tomorrow for colonoscopy today.  Pricilla Riffle. Fuller Plan  11/29/2021, 1:42 PM See Shea Evans, [facility], to contact our on call provider

## 2021-11-29 NOTE — Progress Notes (Signed)
Called to room to assist during endoscopic procedure.  Patient ID and intended procedure confirmed with present staff. Received instructions for my participation in the procedure from the performing physician.  

## 2021-11-29 NOTE — Progress Notes (Signed)
VSS, transported to PACU °

## 2021-11-30 ENCOUNTER — Other Ambulatory Visit: Payer: Self-pay

## 2021-11-30 ENCOUNTER — Encounter (HOSPITAL_COMMUNITY): Payer: Self-pay | Admitting: Surgery

## 2021-11-30 ENCOUNTER — Inpatient Hospital Stay (HOSPITAL_COMMUNITY): Payer: Medicare Other | Admitting: Physician Assistant

## 2021-11-30 ENCOUNTER — Inpatient Hospital Stay (HOSPITAL_COMMUNITY): Payer: Medicare Other | Admitting: Certified Registered"

## 2021-11-30 ENCOUNTER — Encounter (HOSPITAL_COMMUNITY)
Admission: RE | Disposition: A | Discharge status: Home / Self Care | Payer: Self-pay | Source: Home / Self Care | Attending: Surgery

## 2021-11-30 ENCOUNTER — Other Ambulatory Visit (HOSPITAL_COMMUNITY): Payer: Self-pay

## 2021-11-30 ENCOUNTER — Inpatient Hospital Stay (HOSPITAL_COMMUNITY)
Admission: RE | Admit: 2021-11-30 | Discharge: 2021-12-02 | DRG: 660 | Disposition: A | Discharge status: Home / Self Care | Payer: Medicare Other | Attending: Surgery | Admitting: Surgery

## 2021-11-30 DIAGNOSIS — I252 Old myocardial infarction: Secondary | ICD-10-CM | POA: Diagnosis not present

## 2021-11-30 DIAGNOSIS — I2510 Atherosclerotic heart disease of native coronary artery without angina pectoris: Secondary | ICD-10-CM | POA: Diagnosis present

## 2021-11-30 DIAGNOSIS — N400 Enlarged prostate without lower urinary tract symptoms: Secondary | ICD-10-CM | POA: Diagnosis present

## 2021-11-30 DIAGNOSIS — M1990 Unspecified osteoarthritis, unspecified site: Secondary | ICD-10-CM | POA: Diagnosis present

## 2021-11-30 DIAGNOSIS — Z7982 Long term (current) use of aspirin: Secondary | ICD-10-CM | POA: Diagnosis not present

## 2021-11-30 DIAGNOSIS — I4891 Unspecified atrial fibrillation: Secondary | ICD-10-CM | POA: Diagnosis present

## 2021-11-30 DIAGNOSIS — I10 Essential (primary) hypertension: Secondary | ICD-10-CM | POA: Diagnosis present

## 2021-11-30 DIAGNOSIS — Z20822 Contact with and (suspected) exposure to covid-19: Secondary | ICD-10-CM | POA: Diagnosis present

## 2021-11-30 DIAGNOSIS — K4090 Unilateral inguinal hernia, without obstruction or gangrene, not specified as recurrent: Secondary | ICD-10-CM | POA: Diagnosis not present

## 2021-11-30 DIAGNOSIS — E119 Type 2 diabetes mellitus without complications: Secondary | ICD-10-CM | POA: Diagnosis present

## 2021-11-30 DIAGNOSIS — Z803 Family history of malignant neoplasm of breast: Secondary | ICD-10-CM | POA: Diagnosis not present

## 2021-11-30 DIAGNOSIS — E538 Deficiency of other specified B group vitamins: Secondary | ICD-10-CM | POA: Diagnosis present

## 2021-11-30 DIAGNOSIS — K5732 Diverticulitis of large intestine without perforation or abscess without bleeding: Secondary | ICD-10-CM

## 2021-11-30 DIAGNOSIS — E785 Hyperlipidemia, unspecified: Secondary | ICD-10-CM | POA: Diagnosis present

## 2021-11-30 DIAGNOSIS — Z791 Long term (current) use of non-steroidal anti-inflammatories (NSAID): Secondary | ICD-10-CM | POA: Diagnosis not present

## 2021-11-30 DIAGNOSIS — Z79899 Other long term (current) drug therapy: Secondary | ICD-10-CM | POA: Diagnosis not present

## 2021-11-30 DIAGNOSIS — L910 Hypertrophic scar: Secondary | ICD-10-CM | POA: Diagnosis present

## 2021-11-30 DIAGNOSIS — E559 Vitamin D deficiency, unspecified: Secondary | ICD-10-CM | POA: Diagnosis present

## 2021-11-30 DIAGNOSIS — Z8249 Family history of ischemic heart disease and other diseases of the circulatory system: Secondary | ICD-10-CM

## 2021-11-30 DIAGNOSIS — Z96652 Presence of left artificial knee joint: Secondary | ICD-10-CM | POA: Diagnosis present

## 2021-11-30 DIAGNOSIS — N321 Vesicointestinal fistula: Principal | ICD-10-CM | POA: Diagnosis present

## 2021-11-30 DIAGNOSIS — Z833 Family history of diabetes mellitus: Secondary | ICD-10-CM

## 2021-11-30 DIAGNOSIS — Z87891 Personal history of nicotine dependence: Secondary | ICD-10-CM

## 2021-11-30 DIAGNOSIS — Z885 Allergy status to narcotic agent status: Secondary | ICD-10-CM

## 2021-11-30 DIAGNOSIS — Z951 Presence of aortocoronary bypass graft: Secondary | ICD-10-CM | POA: Diagnosis not present

## 2021-11-30 DIAGNOSIS — K660 Peritoneal adhesions (postprocedural) (postinfection): Secondary | ICD-10-CM

## 2021-11-30 DIAGNOSIS — Z881 Allergy status to other antibiotic agents status: Secondary | ICD-10-CM | POA: Diagnosis not present

## 2021-11-30 DIAGNOSIS — G4700 Insomnia, unspecified: Secondary | ICD-10-CM | POA: Diagnosis present

## 2021-11-30 HISTORY — PX: PROCTOSCOPY: SHX2266

## 2021-11-30 LAB — TYPE AND SCREEN
ABO/RH(D): AB POS
Antibody Screen: NEGATIVE

## 2021-11-30 LAB — GLUCOSE, CAPILLARY
Glucose-Capillary: 91 mg/dL (ref 70–99)
Glucose-Capillary: 137 mg/dL — ABNORMAL HIGH (ref 70–99)
Glucose-Capillary: 195 mg/dL — ABNORMAL HIGH (ref 70–99)
Glucose-Capillary: 147 mg/dL — ABNORMAL HIGH (ref 70–99)

## 2021-11-30 SURGERY — COLECTOMY, SIGMOID, ROBOT-ASSISTED
Anesthesia: General

## 2021-11-30 MED ORDER — LACTATED RINGERS IV SOLN: INTRAVENOUS | Status: DC

## 2021-11-30 MED ORDER — MIDAZOLAM HCL 2 MG/2ML IJ SOLN
INTRAMUSCULAR | Status: AC
  Filled 2021-11-30: qty 2

## 2021-11-30 MED ORDER — ACETAMINOPHEN 500 MG PO TABS
1000.0000 mg | ORAL_TABLET | Freq: Four times a day (QID) | ORAL | Status: DC
  Administered 2021-11-30 – 2021-12-02 (×6): 1000 mg via ORAL
  Filled 2021-11-30 (×6): qty 2

## 2021-11-30 MED ORDER — TRAMADOL HCL 50 MG PO TABS
50.0000 mg | ORAL_TABLET | Freq: Four times a day (QID) | ORAL | 0 refills | Status: DC | PRN
  Filled 2021-11-30: qty 20, 3d supply, fill #0

## 2021-11-30 MED ORDER — BUPIVACAINE LIPOSOME 1.3 % IJ SUSP: 20.0000 mL | Freq: Once | INTRAMUSCULAR | Status: DC

## 2021-11-30 MED ORDER — ROCURONIUM BROMIDE 10 MG/ML (PF) SYRINGE
PREFILLED_SYRINGE | INTRAVENOUS | Status: DC | PRN
  Administered 2021-11-30: 20 mg via INTRAVENOUS
  Administered 2021-11-30: 60 mg via INTRAVENOUS
  Administered 2021-11-30 (×2): 20 mg via INTRAVENOUS

## 2021-11-30 MED ORDER — KETAMINE HCL 50 MG/5ML IJ SOSY
PREFILLED_SYRINGE | INTRAMUSCULAR | Status: AC
  Filled 2021-11-30: qty 5

## 2021-11-30 MED ORDER — ACETAMINOPHEN 10 MG/ML IV SOLN: 1000.0000 mg | Freq: Once | INTRAVENOUS | Status: DC | PRN

## 2021-11-30 MED ORDER — ONDANSETRON HCL 4 MG PO TABS: 4.0000 mg | ORAL_TABLET | Freq: Four times a day (QID) | ORAL | Status: DC | PRN

## 2021-11-30 MED ORDER — ADULT MULTIVITAMIN W/MINERALS CH
1.0000 | ORAL_TABLET | Freq: Every day | ORAL | Status: DC
  Administered 2021-12-01 – 2021-12-02 (×2): 1 via ORAL
  Filled 2021-11-30 (×2): qty 1

## 2021-11-30 MED ORDER — LIDOCAINE 2% (20 MG/ML) 5 ML SYRINGE
INTRAMUSCULAR | Status: DC | PRN
  Administered 2021-11-30: 40 mg via INTRAVENOUS

## 2021-11-30 MED ORDER — BISACODYL 5 MG PO TBEC: 20.0000 mg | DELAYED_RELEASE_TABLET | Freq: Once | ORAL | Status: DC

## 2021-11-30 MED ORDER — ENSURE SURGERY PO LIQD
237.0000 mL | Freq: Two times a day (BID) | ORAL | Status: DC
  Administered 2021-11-30 – 2021-12-02 (×4): 237 mL via ORAL

## 2021-11-30 MED ORDER — PROCHLORPERAZINE MALEATE 10 MG PO TABS
10.0000 mg | ORAL_TABLET | Freq: Four times a day (QID) | ORAL | Status: DC | PRN
  Filled 2021-11-30: qty 1

## 2021-11-30 MED ORDER — METOPROLOL TARTRATE 5 MG/5ML IV SOLN: 5.0000 mg | Freq: Four times a day (QID) | INTRAVENOUS | Status: DC | PRN

## 2021-11-30 MED ORDER — ENSURE PRE-SURGERY PO LIQD: 296.0000 mL | Freq: Once | ORAL | Status: DC

## 2021-11-30 MED ORDER — ONDANSETRON HCL 4 MG/2ML IJ SOLN
4.0000 mg | Freq: Four times a day (QID) | INTRAMUSCULAR | Status: DC | PRN
Start: 2021-11-30 — End: 2021-12-02

## 2021-11-30 MED ORDER — MIDAZOLAM HCL 5 MG/5ML IJ SOLN
INTRAMUSCULAR | Status: DC | PRN
  Administered 2021-11-30: 2 mg via INTRAVENOUS

## 2021-11-30 MED ORDER — TRIAMCINOLONE ACETONIDE 40 MG/ML IJ SUSP
INTRAMUSCULAR | Status: AC
  Filled 2021-11-30: qty 1

## 2021-11-30 MED ORDER — BUPIVACAINE LIPOSOME 1.3 % IJ SUSP
INTRAMUSCULAR | Status: DC | PRN
Start: 2021-11-30 — End: 2021-11-30
  Administered 2021-11-30: 20 mL

## 2021-11-30 MED ORDER — AMISULPRIDE (ANTIEMETIC) 5 MG/2ML IV SOLN: 10.0000 mg | Freq: Once | INTRAVENOUS | Status: DC | PRN

## 2021-11-30 MED ORDER — ENALAPRILAT 1.25 MG/ML IV SOLN
0.6250 mg | Freq: Four times a day (QID) | INTRAVENOUS | Status: DC | PRN
  Filled 2021-11-30: qty 1

## 2021-11-30 MED ORDER — PROPOFOL 10 MG/ML IV BOLUS
INTRAVENOUS | Status: AC
  Filled 2021-11-30: qty 20

## 2021-11-30 MED ORDER — ORAL CARE MOUTH RINSE: 15.0000 mL | Freq: Once | OROMUCOSAL | Status: AC

## 2021-11-30 MED ORDER — CALCIUM POLYCARBOPHIL 625 MG PO TABS
625.0000 mg | ORAL_TABLET | Freq: Two times a day (BID) | ORAL | Status: DC
  Administered 2021-11-30 – 2021-12-02 (×4): 625 mg via ORAL
  Filled 2021-11-30 (×4): qty 1

## 2021-11-30 MED ORDER — TRIAMCINOLONE ACETONIDE 0.1 % EX OINT: 1.0000 "application " | TOPICAL_OINTMENT | Freq: Every day | CUTANEOUS | Status: DC | PRN

## 2021-11-30 MED ORDER — ALVIMOPAN 12 MG PO CAPS
12.0000 mg | ORAL_CAPSULE | ORAL | Status: AC
  Administered 2021-11-30: 12 mg via ORAL
  Filled 2021-11-30: qty 1

## 2021-11-30 MED ORDER — ACETAMINOPHEN 160 MG/5ML PO SOLN: 325.0000 mg | ORAL | Status: DC | PRN

## 2021-11-30 MED ORDER — ENOXAPARIN SODIUM 40 MG/0.4ML IJ SOSY
40.0000 mg | PREFILLED_SYRINGE | Freq: Once | INTRAMUSCULAR | Status: AC
  Administered 2021-11-30: 40 mg via SUBCUTANEOUS
  Filled 2021-11-30: qty 0.4

## 2021-11-30 MED ORDER — FENTANYL CITRATE (PF) 100 MCG/2ML IJ SOLN
INTRAMUSCULAR | Status: DC | PRN
  Administered 2021-11-30: 100 ug via INTRAVENOUS

## 2021-11-30 MED ORDER — FENTANYL CITRATE (PF) 100 MCG/2ML IJ SOLN
INTRAMUSCULAR | Status: AC
  Filled 2021-11-30: qty 2

## 2021-11-30 MED ORDER — LIDOCAINE HCL (PF) 2 % IJ SOLN
INTRAMUSCULAR | Status: DC | PRN
  Administered 2021-11-30: 1.5 mg/kg/h via INTRADERMAL

## 2021-11-30 MED ORDER — SUGAMMADEX SODIUM 200 MG/2ML IV SOLN
INTRAVENOUS | Status: DC | PRN
Start: 2021-11-30 — End: 2021-11-30
  Administered 2021-11-30: 200 mg via INTRAVENOUS

## 2021-11-30 MED ORDER — METHYLENE BLUE 0.5 % INJ SOLN
INTRAVENOUS | Status: AC
  Filled 2021-11-30: qty 10

## 2021-11-30 MED ORDER — ATORVASTATIN CALCIUM 40 MG PO TABS
40.0000 mg | ORAL_TABLET | ORAL | Status: DC
  Administered 2021-12-02: 40 mg via ORAL
  Filled 2021-11-30: qty 1

## 2021-11-30 MED ORDER — ACETAMINOPHEN 325 MG PO TABS: 325.0000 mg | ORAL_TABLET | ORAL | Status: DC | PRN

## 2021-11-30 MED ORDER — TAMSULOSIN HCL 0.4 MG PO CAPS: 0.4000 mg | ORAL_CAPSULE | Freq: Every day | ORAL | Status: DC

## 2021-11-30 MED ORDER — FENTANYL CITRATE PF 50 MCG/ML IJ SOSY: 25.0000 ug | PREFILLED_SYRINGE | INTRAMUSCULAR | Status: DC | PRN

## 2021-11-30 MED ORDER — STERILE WATER FOR INJECTION IJ SOLN
INTRAMUSCULAR | Status: DC | PRN
  Administered 2021-11-30: 15 mL via INTRAVESICAL

## 2021-11-30 MED ORDER — OXYCODONE HCL 5 MG/5ML PO SOLN: 5.0000 mg | Freq: Once | ORAL | Status: DC | PRN

## 2021-11-30 MED ORDER — ENOXAPARIN SODIUM 40 MG/0.4ML IJ SOSY
40.0000 mg | PREFILLED_SYRINGE | INTRAMUSCULAR | Status: DC
  Administered 2021-12-01 – 2021-12-02 (×2): 40 mg via SUBCUTANEOUS
  Filled 2021-11-30 (×2): qty 0.4

## 2021-11-30 MED ORDER — LORATADINE 10 MG PO TABS: 10.0000 mg | ORAL_TABLET | Freq: Every day | ORAL | Status: DC | PRN

## 2021-11-30 MED ORDER — PROPOFOL 10 MG/ML IV BOLUS
INTRAVENOUS | Status: DC | PRN
  Administered 2021-11-30: 100 mg via INTRAVENOUS

## 2021-11-30 MED ORDER — HYDRALAZINE HCL 20 MG/ML IJ SOLN: 10.0000 mg | INTRAMUSCULAR | Status: DC | PRN

## 2021-11-30 MED ORDER — PHENYLEPHRINE HCL-NACL 20-0.9 MG/250ML-% IV SOLN
INTRAVENOUS | Status: DC | PRN
Start: 2021-11-30 — End: 2021-11-30

## 2021-11-30 MED ORDER — SODIUM CHLORIDE 0.9 % IV SOLN
2.0000 g | Freq: Two times a day (BID) | INTRAVENOUS | Status: AC
  Administered 2021-11-30: 2 g via INTRAVENOUS
  Filled 2021-11-30: qty 2

## 2021-11-30 MED ORDER — CHLORHEXIDINE GLUCONATE 0.12 % MT SOLN
15.0000 mL | Freq: Once | OROMUCOSAL | Status: AC
  Administered 2021-11-30: 15 mL via OROMUCOSAL

## 2021-11-30 MED ORDER — SODIUM CHLORIDE 0.9 % IV SOLN
2.0000 g | INTRAVENOUS | Status: AC
  Administered 2021-11-30: 2 g via INTRAVENOUS
  Filled 2021-11-30: qty 2

## 2021-11-30 MED ORDER — BUPIVACAINE LIPOSOME 1.3 % IJ SUSP
INTRAMUSCULAR | Status: AC
  Filled 2021-11-30: qty 20

## 2021-11-30 MED ORDER — POLYETHYLENE GLYCOL 3350 17 GM/SCOOP PO POWD: 1.0000 | Freq: Once | ORAL | Status: DC

## 2021-11-30 MED ORDER — LACTATED RINGERS IR SOLN
Status: DC | PRN
  Administered 2021-11-30: 1000 mL

## 2021-11-30 MED ORDER — DIPHENHYDRAMINE HCL 12.5 MG/5ML PO ELIX: 12.5000 mg | ORAL_SOLUTION | Freq: Four times a day (QID) | ORAL | Status: DC | PRN

## 2021-11-30 MED ORDER — SODIUM CHLORIDE (PF) 0.9 % IJ SOLN
INTRAMUSCULAR | Status: DC | PRN
  Administered 2021-11-30: 10 mL via INTRAVENOUS

## 2021-11-30 MED ORDER — TRAMADOL HCL 50 MG PO TABS
50.0000 mg | ORAL_TABLET | Freq: Four times a day (QID) | ORAL | Status: DC | PRN
  Administered 2021-11-30 – 2021-12-01 (×2): 100 mg via ORAL
  Filled 2021-11-30 (×2): qty 2

## 2021-11-30 MED ORDER — SPY AGENT GREEN - (INDOCYANINE FOR INJECTION)
INTRAMUSCULAR | Status: DC | PRN
  Administered 2021-11-30: 5 mL via INTRAVENOUS

## 2021-11-30 MED ORDER — INSULIN ASPART 100 UNIT/ML IJ SOLN: 0.0000 [IU] | Freq: Every day | INTRAMUSCULAR | Status: DC

## 2021-11-30 MED ORDER — DIPHENHYDRAMINE HCL 50 MG/ML IJ SOLN: 12.5000 mg | Freq: Four times a day (QID) | INTRAMUSCULAR | Status: DC | PRN

## 2021-11-30 MED ORDER — METRONIDAZOLE 500 MG PO TABS: 1000.0000 mg | ORAL_TABLET | ORAL | Status: DC

## 2021-11-30 MED ORDER — NEOMYCIN SULFATE 500 MG PO TABS: 1000.0000 mg | ORAL_TABLET | ORAL | Status: DC

## 2021-11-30 MED ORDER — PHENYLEPHRINE 40 MCG/ML (10ML) SYRINGE FOR IV PUSH (FOR BLOOD PRESSURE SUPPORT)
PREFILLED_SYRINGE | INTRAVENOUS | Status: DC | PRN
  Administered 2021-11-30: 120 ug via INTRAVENOUS
  Administered 2021-11-30 (×2): 80 ug via INTRAVENOUS
  Administered 2021-11-30: 120 ug via INTRAVENOUS

## 2021-11-30 MED ORDER — LACTATED RINGERS IV SOLN: INTRAVENOUS | Status: DC | PRN

## 2021-11-30 MED ORDER — GABAPENTIN 300 MG PO CAPS
300.0000 mg | ORAL_CAPSULE | ORAL | Status: AC
  Administered 2021-11-30: 300 mg via ORAL
  Filled 2021-11-30: qty 1

## 2021-11-30 MED ORDER — ROCURONIUM BROMIDE 10 MG/ML (PF) SYRINGE
PREFILLED_SYRINGE | INTRAVENOUS | Status: AC
  Filled 2021-11-30: qty 10

## 2021-11-30 MED ORDER — VITAMIN B-12 1000 MCG PO TABS
1000.0000 ug | ORAL_TABLET | Freq: Every day | ORAL | Status: DC
  Administered 2021-12-01 – 2021-12-02 (×2): 1000 ug via ORAL
  Filled 2021-11-30 (×2): qty 1

## 2021-11-30 MED ORDER — TRAZODONE HCL 50 MG PO TABS: 25.0000 mg | ORAL_TABLET | Freq: Every evening | ORAL | Status: DC | PRN

## 2021-11-30 MED ORDER — MAGIC MOUTHWASH
15.0000 mL | Freq: Four times a day (QID) | ORAL | Status: DC | PRN
  Filled 2021-11-30: qty 15

## 2021-11-30 MED ORDER — PROMETHAZINE HCL 25 MG/ML IJ SOLN: 6.2500 mg | INTRAMUSCULAR | Status: DC | PRN

## 2021-11-30 MED ORDER — SODIUM CHLORIDE (PF) 0.9 % IJ SOLN
INTRAMUSCULAR | Status: AC
  Filled 2021-11-30: qty 10

## 2021-11-30 MED ORDER — PHENYLEPHRINE HCL (PRESSORS) 10 MG/ML IV SOLN
INTRAVENOUS | Status: AC
  Filled 2021-11-30: qty 1

## 2021-11-30 MED ORDER — KETAMINE HCL 10 MG/ML IJ SOLN
INTRAMUSCULAR | Status: DC | PRN
  Administered 2021-11-30: 10 mg via INTRAVENOUS
  Administered 2021-11-30: 20 mg via INTRAVENOUS
  Administered 2021-11-30: 10 mg via INTRAVENOUS

## 2021-11-30 MED ORDER — METHYLENE BLUE 0.5 % INJ SOLN
INTRAVENOUS | Status: DC | PRN
  Administered 2021-11-30: 10 mL

## 2021-11-30 MED ORDER — ENSURE PRE-SURGERY PO LIQD: 592.0000 mL | Freq: Once | ORAL | Status: DC

## 2021-11-30 MED ORDER — LIDOCAINE HCL (PF) 2 % IJ SOLN
INTRAMUSCULAR | Status: AC
  Filled 2021-11-30: qty 15

## 2021-11-30 MED ORDER — VITAMIN D 25 MCG (1000 UNIT) PO TABS
1000.0000 [IU] | ORAL_TABLET | Freq: Every day | ORAL | Status: DC
  Administered 2021-12-01 – 2021-12-02 (×2): 1000 [IU] via ORAL
  Filled 2021-11-30 (×2): qty 1

## 2021-11-30 MED ORDER — METOPROLOL TARTRATE 12.5 MG HALF TABLET
12.5000 mg | ORAL_TABLET | Freq: Two times a day (BID) | ORAL | Status: DC
Start: 2021-11-30 — End: 2021-12-02
  Administered 2021-11-30 – 2021-12-02 (×4): 12.5 mg via ORAL
  Filled 2021-11-30 (×4): qty 1

## 2021-11-30 MED ORDER — ACETAMINOPHEN 500 MG PO TABS
1000.0000 mg | ORAL_TABLET | ORAL | Status: AC
  Administered 2021-11-30: 1000 mg via ORAL
  Filled 2021-11-30: qty 2

## 2021-11-30 MED ORDER — ASPIRIN EC 81 MG PO TBEC
81.0000 mg | DELAYED_RELEASE_TABLET | Freq: Every day | ORAL | Status: DC
  Administered 2021-12-01 – 2021-12-02 (×2): 81 mg via ORAL
  Filled 2021-11-30 (×2): qty 1

## 2021-11-30 MED ORDER — INSULIN ASPART 100 UNIT/ML IJ SOLN
0.0000 [IU] | Freq: Three times a day (TID) | INTRAMUSCULAR | Status: DC
  Administered 2021-11-30: 3 [IU] via SUBCUTANEOUS
  Administered 2021-12-01: 2 [IU] via SUBCUTANEOUS

## 2021-11-30 MED ORDER — LACTATED RINGERS IV BOLUS
1000.0000 mL | Freq: Three times a day (TID) | INTRAVENOUS | Status: DC | PRN
Start: 2021-11-30 — End: 2021-12-02

## 2021-11-30 MED ORDER — TRIAMCINOLONE ACETONIDE 40 MG/ML IJ SUSP
INTRAMUSCULAR | Status: DC | PRN
  Administered 2021-11-30: 40 mg via INTRAMUSCULAR

## 2021-11-30 MED ORDER — TIZANIDINE HCL 4 MG PO TABS: 4.0000 mg | ORAL_TABLET | Freq: Three times a day (TID) | ORAL | Status: DC | PRN

## 2021-11-30 MED ORDER — BUPIVACAINE-EPINEPHRINE (PF) 0.25% -1:200000 IJ SOLN
INTRAMUSCULAR | Status: AC
  Filled 2021-11-30: qty 60

## 2021-11-30 MED ORDER — DEXAMETHASONE SODIUM PHOSPHATE 10 MG/ML IJ SOLN
INTRAMUSCULAR | Status: DC | PRN
  Administered 2021-11-30: 5 mg via INTRAVENOUS

## 2021-11-30 MED ORDER — PHENYLEPHRINE HCL-NACL 20-0.9 MG/250ML-% IV SOLN
INTRAVENOUS | Status: DC | PRN
  Administered 2021-11-30: 50 ug/min via INTRAVENOUS

## 2021-11-30 MED ORDER — OXYCODONE HCL 5 MG PO TABS: 5.0000 mg | ORAL_TABLET | Freq: Once | ORAL | Status: DC | PRN

## 2021-11-30 MED ORDER — SIMETHICONE 40 MG/0.6ML PO SUSP
80.0000 mg | Freq: Four times a day (QID) | ORAL | Status: DC | PRN
Start: 2021-11-30 — End: 2021-12-02
  Filled 2021-11-30: qty 1.2

## 2021-11-30 MED ORDER — PHENYLEPHRINE HCL-NACL 20-0.9 MG/250ML-% IV SOLN: INTRAVENOUS | Status: DC | PRN

## 2021-11-30 MED ORDER — MELATONIN 3 MG PO TABS: 3.0000 mg | ORAL_TABLET | Freq: Every evening | ORAL | Status: DC | PRN

## 2021-11-30 MED ORDER — HYDROMORPHONE HCL 1 MG/ML IJ SOLN: 0.5000 mg | INTRAMUSCULAR | Status: DC | PRN

## 2021-11-30 MED ORDER — LIP MEDEX EX OINT
1.0000 "application " | TOPICAL_OINTMENT | Freq: Two times a day (BID) | CUTANEOUS | Status: DC
  Administered 2021-11-30 – 2021-12-02 (×3): 1 via TOPICAL
  Filled 2021-11-30 (×2): qty 7

## 2021-11-30 MED ORDER — BUPIVACAINE-EPINEPHRINE (PF) 0.25% -1:200000 IJ SOLN
INTRAMUSCULAR | Status: DC | PRN
  Administered 2021-11-30: 60 mL via PERINEURAL

## 2021-11-30 MED ORDER — ONDANSETRON HCL 4 MG/2ML IJ SOLN
INTRAMUSCULAR | Status: DC | PRN
  Administered 2021-11-30: 4 mg via INTRAVENOUS

## 2021-11-30 MED ORDER — STERILE WATER FOR INJECTION IJ SOLN
INTRAMUSCULAR | Status: AC
  Filled 2021-11-30: qty 10

## 2021-11-30 MED ORDER — PHENYLEPHRINE 40 MCG/ML (10ML) SYRINGE FOR IV PUSH (FOR BLOOD PRESSURE SUPPORT)
PREFILLED_SYRINGE | INTRAVENOUS | Status: AC
  Filled 2021-11-30: qty 10

## 2021-11-30 MED ORDER — ALUM & MAG HYDROXIDE-SIMETH 200-200-20 MG/5ML PO SUSP: 30.0000 mL | Freq: Four times a day (QID) | ORAL | Status: DC | PRN

## 2021-11-30 MED ORDER — ALVIMOPAN 12 MG PO CAPS
12.0000 mg | ORAL_CAPSULE | Freq: Two times a day (BID) | ORAL | Status: DC
  Filled 2021-11-30: qty 1

## 2021-11-30 MED ORDER — PROCHLORPERAZINE EDISYLATE 10 MG/2ML IJ SOLN: 5.0000 mg | Freq: Four times a day (QID) | INTRAMUSCULAR | Status: DC | PRN

## 2021-11-30 SURGICAL SUPPLY — 129 items
ADAPTER GOLDBERG URETERAL (ADAPTER) IMPLANT
ADPR CATH 15X14FR FL DRN BG (ADAPTER)
APL PRP STRL LF DISP 70% ISPRP (MISCELLANEOUS)
APPLIER CLIP 5 13 M/L LIGAMAX5 (MISCELLANEOUS)
APPLIER CLIP ROT 10 11.4 M/L (STAPLE)
APR CLP MED LRG 11.4X10 (STAPLE)
APR CLP MED LRG 5 ANG JAW (MISCELLANEOUS)
BAG COUNTER SPONGE SURGICOUNT (BAG) ×2 IMPLANT
BAG SPNG CNTER NS LX DISP (BAG) ×1
BAG URO CATCHER STRL LF (MISCELLANEOUS) ×2 IMPLANT
BLADE EXTENDED COATED 6.5IN (ELECTRODE) IMPLANT
CANNULA REDUC XI 12-8 STAPL (CANNULA)
CANNULA REDUCER 12-8 DVNC XI (CANNULA) IMPLANT
CATH URETL OPEN 5X70 (CATHETERS) ×2 IMPLANT
CELLS DAT CNTRL 66122 CELL SVR (MISCELLANEOUS) IMPLANT
CHLORAPREP W/TINT 26 (MISCELLANEOUS) IMPLANT
CLIP APPLIE 5 13 M/L LIGAMAX5 (MISCELLANEOUS) IMPLANT
CLIP APPLIE ROT 10 11.4 M/L (STAPLE) IMPLANT
CLOTH BEACON ORANGE TIMEOUT ST (SAFETY) ×2 IMPLANT
COVER SURGICAL LIGHT HANDLE (MISCELLANEOUS) ×4 IMPLANT
COVER TIP SHEARS 8 DVNC (MISCELLANEOUS) ×1 IMPLANT
COVER TIP SHEARS 8MM DA VINCI (MISCELLANEOUS) ×2
DEVICE TROCAR PUNCTURE CLOSURE (ENDOMECHANICALS) IMPLANT
DRAIN CHANNEL 19F RND (DRAIN) IMPLANT
DRAPE ARM DVNC X/XI (DISPOSABLE) ×4 IMPLANT
DRAPE COLUMN DVNC XI (DISPOSABLE) ×1 IMPLANT
DRAPE DA VINCI XI ARM (DISPOSABLE) ×8
DRAPE DA VINCI XI COLUMN (DISPOSABLE) ×2
DRAPE SURG IRRIG POUCH 19X23 (DRAPES) ×2 IMPLANT
DRSG OPSITE POSTOP 4X10 (GAUZE/BANDAGES/DRESSINGS) ×1 IMPLANT
DRSG OPSITE POSTOP 4X6 (GAUZE/BANDAGES/DRESSINGS) IMPLANT
DRSG OPSITE POSTOP 4X8 (GAUZE/BANDAGES/DRESSINGS) IMPLANT
DRSG TEGADERM 2-3/8X2-3/4 SM (GAUZE/BANDAGES/DRESSINGS) ×10 IMPLANT
DRSG TEGADERM 4X4.75 (GAUZE/BANDAGES/DRESSINGS) IMPLANT
ELECT PENCIL ROCKER SW 15FT (MISCELLANEOUS) ×2 IMPLANT
ELECT REM PT RETURN 15FT ADLT (MISCELLANEOUS) ×2 IMPLANT
ENDOLOOP SUT PDS II  0 18 (SUTURE)
ENDOLOOP SUT PDS II 0 18 (SUTURE) IMPLANT
EVACUATOR SILICONE 100CC (DRAIN) IMPLANT
GAUZE SPONGE 2X2 8PLY STRL LF (GAUZE/BANDAGES/DRESSINGS) ×1 IMPLANT
GLOVE SURG ENC MOIS LTX SZ7 (GLOVE) ×2 IMPLANT
GLOVE SURG ENC TEXT LTX SZ7.5 (GLOVE) ×2 IMPLANT
GLOVE SURG NEOPR MICRO LF SZ8 (GLOVE) ×6 IMPLANT
GLOVE SURG UNDER LTX SZ8 (GLOVE) ×6 IMPLANT
GOWN STRL REUS W/TWL LRG LVL3 (GOWN DISPOSABLE) ×2 IMPLANT
GOWN STRL REUS W/TWL XL LVL3 (GOWN DISPOSABLE) ×8 IMPLANT
GRASPER SUT TROCAR 14GX15 (MISCELLANEOUS) IMPLANT
GUIDEWIRE ANG ZIPWIRE 038X150 (WIRE) IMPLANT
GUIDEWIRE STR DUAL SENSOR (WIRE) IMPLANT
GUIDEWIRE ZIPWRE .038 STRAIGHT (WIRE) ×2 IMPLANT
HOLDER FOLEY CATH W/STRAP (MISCELLANEOUS) ×2 IMPLANT
IRRIG SUCT STRYKERFLOW 2 WTIP (MISCELLANEOUS) ×2
IRRIGATION SUCT STRKRFLW 2 WTP (MISCELLANEOUS) ×1 IMPLANT
KIT PROCEDURE DA VINCI SI (MISCELLANEOUS) ×2
KIT PROCEDURE DVNC SI (MISCELLANEOUS) ×1 IMPLANT
KIT SIGMOIDOSCOPE (SET/KITS/TRAYS/PACK) IMPLANT
KIT TURNOVER KIT A (KITS) IMPLANT
MANIFOLD NEPTUNE II (INSTRUMENTS) ×2 IMPLANT
NDL INSUFFLATION 14GA 120MM (NEEDLE) ×1 IMPLANT
NEEDLE INSUFFLATION 14GA 120MM (NEEDLE) ×2 IMPLANT
PACK CARDIOVASCULAR III (CUSTOM PROCEDURE TRAY) ×2 IMPLANT
PACK COLON (CUSTOM PROCEDURE TRAY) ×2 IMPLANT
PACK CYSTO (CUSTOM PROCEDURE TRAY) ×2 IMPLANT
PAD POSITIONING PINK XL (MISCELLANEOUS) ×2 IMPLANT
PROTECTOR NERVE ULNAR (MISCELLANEOUS) ×4 IMPLANT
RELOAD STAPLE 45 3.5 BLU DVNC (STAPLE) IMPLANT
RELOAD STAPLE 45 4.3 GRN DVNC (STAPLE) IMPLANT
RELOAD STAPLE 60 3.5 BLU DVNC (STAPLE) IMPLANT
RELOAD STAPLE 60 4.3 GRN DVNC (STAPLE) IMPLANT
RELOAD STAPLER 3.5X45 BLU DVNC (STAPLE) IMPLANT
RELOAD STAPLER 3.5X60 BLU DVNC (STAPLE) IMPLANT
RELOAD STAPLER 4.3X45 GRN DVNC (STAPLE) IMPLANT
RELOAD STAPLER 4.3X60 GRN DVNC (STAPLE) IMPLANT
RETRACTOR WND ALEXIS 18 MED (MISCELLANEOUS) IMPLANT
RTRCTR WOUND ALEXIS 18CM MED (MISCELLANEOUS)
SCISSORS LAP 5X35 DISP (ENDOMECHANICALS) ×2 IMPLANT
SEAL CANN UNIV 5-8 DVNC XI (MISCELLANEOUS) ×3 IMPLANT
SEAL XI 5MM-8MM UNIVERSAL (MISCELLANEOUS) ×6
SEALER VESSEL DA VINCI XI (MISCELLANEOUS) ×2
SEALER VESSEL EXT DVNC XI (MISCELLANEOUS) ×1 IMPLANT
SOLUTION ELECTROLUBE (MISCELLANEOUS) ×2 IMPLANT
SPIKE FLUID TRANSFER (MISCELLANEOUS) ×2 IMPLANT
SPONGE GAUZE 2X2 STER 10/PKG (GAUZE/BANDAGES/DRESSINGS) ×1
STAPLER 45 DA VINCI SURE FORM (STAPLE)
STAPLER 45 SUREFORM DVNC (STAPLE) IMPLANT
STAPLER 60 DA VINCI SURE FORM (STAPLE) ×2
STAPLER 60 SUREFORM DVNC (STAPLE) IMPLANT
STAPLER CANNULA SEAL DVNC XI (STAPLE) ×1 IMPLANT
STAPLER CANNULA SEAL XI (STAPLE) ×2
STAPLER ECHELON POWER CIR 29 (STAPLE) IMPLANT
STAPLER ECHELON POWER CIR 31 (STAPLE) ×1 IMPLANT
STAPLER RELOAD 3.5X45 BLU DVNC (STAPLE)
STAPLER RELOAD 3.5X45 BLUE (STAPLE)
STAPLER RELOAD 3.5X60 BLU DVNC (STAPLE)
STAPLER RELOAD 3.5X60 BLUE (STAPLE)
STAPLER RELOAD 4.3X45 GREEN (STAPLE)
STAPLER RELOAD 4.3X45 GRN DVNC (STAPLE)
STAPLER RELOAD 4.3X60 GREEN (STAPLE)
STAPLER RELOAD 4.3X60 GRN DVNC (STAPLE)
STOPCOCK 4 WAY LG BORE MALE ST (IV SETS) ×4 IMPLANT
SURGILUBE 2OZ TUBE FLIPTOP (MISCELLANEOUS) IMPLANT
SUT MNCRL AB 4-0 PS2 18 (SUTURE) ×2 IMPLANT
SUT PDS AB 1 CT1 27 (SUTURE) ×4 IMPLANT
SUT PROLENE 0 CT 2 (SUTURE) IMPLANT
SUT PROLENE 2 0 KS (SUTURE) IMPLANT
SUT PROLENE 2 0 SH DA (SUTURE) IMPLANT
SUT SILK 2 0 (SUTURE)
SUT SILK 2 0 SH CR/8 (SUTURE) IMPLANT
SUT SILK 2-0 18XBRD TIE 12 (SUTURE) IMPLANT
SUT SILK 3 0 (SUTURE)
SUT SILK 3 0 SH CR/8 (SUTURE) ×2 IMPLANT
SUT SILK 3-0 18XBRD TIE 12 (SUTURE) IMPLANT
SUT V-LOC BARB 180 2/0GR6 GS22 (SUTURE)
SUT VIC AB 3-0 SH 18 (SUTURE) IMPLANT
SUT VIC AB 3-0 SH 27 (SUTURE)
SUT VIC AB 3-0 SH 27XBRD (SUTURE) IMPLANT
SUT VICRYL 0 UR6 27IN ABS (SUTURE) ×2 IMPLANT
SUTURE V-LC BRB 180 2/0GR6GS22 (SUTURE) IMPLANT
SYR 10ML ECCENTRIC (SYRINGE) ×2 IMPLANT
SYS LAPSCP GELPORT 120MM (MISCELLANEOUS)
SYS WOUND ALEXIS 18CM MED (MISCELLANEOUS) ×2
SYSTEM LAPSCP GELPORT 120MM (MISCELLANEOUS) IMPLANT
SYSTEM WOUND ALEXIS 18CM MED (MISCELLANEOUS) ×1 IMPLANT
TOWEL OR NON WOVEN STRL DISP B (DISPOSABLE) ×2 IMPLANT
TRAY FOLEY MTR SLVR 16FR STAT (SET/KITS/TRAYS/PACK) ×2 IMPLANT
TROCAR ADV FIXATION 5X100MM (TROCAR) ×2 IMPLANT
TUBING CONNECTING 10 (TUBING) ×6 IMPLANT
TUBING INSUFFLATION 10FT LAP (TUBING) ×2 IMPLANT
TUBING UROLOGY SET (TUBING) IMPLANT

## 2021-11-30 NOTE — H&P (Signed)
11/30/2021    REFERRING PHYSICIAN: Hosiptal, Zacarias Pontes Me*  Patient Care Team: Cassandria Anger, MD as PCP - General (Internal Medicine) Leanora Ivanoff, MD as Consulting Provider (General Surgery) Neale Burly, MD (Cardiovascular Disease) Estanislado Emms., MD (Gastroenterology) Johney Maine, Adrian Saran, MD as Consulting Provider (Colon and Rectal Surgery) Hortencia Pilar, MD (Urology)  PROVIDER: Hollace Kinnier, MD  [facility] MRN: [id number] DOB: [birth year] DATE OF ENCOUNTER: 10/11/2021  SUBJECTIVE   Chief Complaint: Sigmoid diverticulosis   History of Present Illness: Brian Mccullough is a 71 y.o. male who is seen today  as an office consultation at the request of Dr. Myriam Forehand  for evaluation of Sigmoid diverticulosis .   Patient history of colon polyps followed by [facility] gastro[facility]ology. Came in with symptoms concerning for UTI and found to have evidence of a colovesical fistula. Grew out pansensitive Pseudomonas. Not felt to need emergency surgery so recommended close outpatient follow-up. Patient discharged 19 August 2021. Followed up with primary care physician who noted patient had some surgery so consultation requested again. This is the first time I am meeting him  Former smoker. Had a heart attack requiring urgent coronary bypass grafting October 2021 with right radial artery donation. He had postop atrial fibrillation initially controlled with amiodarone. Anticoagulated for a year but now off Plavix and just aspirin only as of October 2022.  Patient notes he discharged on Levaquin. Notes talk about keeping it until he gets surgery that he was off it patient noted that was his bowels once or twice a day. He can walk over half hour without difficulty. [place] history recurrent inguinal hernias. He has repair as a child. Had recurrence requiring bilateral open repairs with mesh by Dr. Dalbert Batman in 2015. He had a chronically reduced left testicle  quadrant orchiectomy by Dr. Jeffie Pollock with urology a couple years ago uneventful. Patient denies any other abdominal surgery. He is a diabetic just controlled with metformin.   Medical History:  Past Medical History:  Diagnosis Date   Arthritis   Heart valve disease   There is no problem list on file for this patient.  Past Surgical History:  Procedure Laterality Date   heart attack 09/24/2020    Allergies  Allergen Reactions   Codeine Nausea   Levofloxacin Other (See Comments)  Can take it but feels achy   Current Outpatient Medications on File Prior to Visit  Medication Sig Dispense Refill   atorvastatin (LIPITOR) 40 MG tablet Take 40 mg by mouth once daily   benazepriL (LOTENSIN) 20 MG tablet Take 1 tablet by mouth once daily   metoprolol tartrate (LOPRESSOR) 25 MG tablet Take 12.5 mg by mouth 3 (three) times daily as needed   aspirin 81 MG EC tablet Take 81 mg by mouth once daily   No current facility-administered medications on file prior to visit.   Family History  Problem Relation Age of Onset   Breast cancer Mother    Social History   Tobacco Use  Smoking Status Former   Types: Cigarettes  Smokeless Tobacco Never    Social History   Socioeconomic History   Marital status: Married  Tobacco Use   Smoking status: Former  Types: Cigarettes   Smokeless tobacco: Never  Substance and Sexual Activity   Alcohol use: Never   Drug use: Never   ############################################################  Review of Systems: A complete review of systems (ROS) was obtained from the patient. I have reviewed this information and discussed as appropriate with the  patient. See HPI as well for other pertinent ROS.  Constitutional: No fevers, chills, sweats. Weight stable Eyes: No vision changes, No discharge HENT: No sore throats, nasal drainage Lymph: No neck swelling, No bruising easily Pulmonary: No cough, productive sputum CV: No orthopnea, PND Patient walks 30  minutes for about 2 miles without difficulty. No exertional chest/neck/shoulder/arm pain.  GI: +polyps. No personal nor family history of GI/colon cancer, inflammatory bowel disease, irritable bowel syndrome, allergy such as Celiac Sprue, dietary/dairy problems, colitis, ulcers nor gastritis. No recent sick contacts/gastroenteritis. No travel outside the country. No changes in diet.  Renal: No UTIs, No hematuria Genital: No drainage, bleeding, masses Musculoskeletal: No severe joint pain. Good ROM major joints Skin: No sores or lesions Heme/Lymph: No easy bleeding. No swollen lymph nodes  OBJECTIVE   Vitals:  10/11/21 1027  BP: 118/82  Pulse: 68  Temp: 36.8 C (98.3 F)  SpO2: 99%  Weight: (!) 107.8 kg (237 lb 9.6 oz)  Height: 193 cm (6\' 4" )  Body mass index is 28.92 kg/m.  11/30/2021 BP 139/81    Pulse (!) 57    Temp 99.1 F (37.3 C) (Oral)    Resp 18    Ht 6\' 3"  (1.905 m)    Wt 108.4 kg    SpO2 100%    BMI 29.87 kg/m    PHYSICAL EXAM:  Constitutional: Not cachectic. Hygeine adequate. Vitals signs as above.  Eyes: Pupils reactive, normal extraocular movements. Sclera nonicteric Neuro: CN II-XII intact. No major focal sensory defects. No major motor deficits. Lymph: No head/neck/groin lymphadenopathy Psych: No severe agitation. No severe anxiety. Judgment & insight Adequate, Oriented x4, HENT: Normocephalic, Mucus membranes moist. No thrush.  Neck: Supple, No tracheal deviation. No obvious thyromegaly Chest: No pain to chest wall compression. Good respiratory excursion. No audible wheezing CV: Pulses intact. Regular rhythm. No major extremity edema Ext: No obvious deformity or contracture. Edema: Not present. No cyanosis Skin: No major subcutaneous nodules. Warm and dry. Tends to have hypertrophic scarring on his arms and chest. Not so much in the groins. Musculoskeletal: Severe joint rigidity not present. No obvious clubbing. No digital petechiae.   Abdomen:  Flat Hernia:  Not present. Diastasis recti: Not present. Soft. Nondistended. Nontender. No hepatomegaly. No splenomegaly  Genital/Pelvic: Inguinal hernia: Not present. Testicle absent. Inguinal lymph nodes: without lymphadenopathy.   Rectal: (Deferred)    ###################################################################  Labs, Imaging and Diagnostic Testing:  Located in 'Care Everywhere' section of Epic EMR chart  PRIOR [facility] CLINIC NOTES:  Located in [place]' section of Epic EMR chart  SURGERY NOTES:  Not applicable  PATHOLOGY:  Located in [place]' section of Epic EMR chart  Assessment and Plan:  DIAGNOSES:  Diagnoses and all orders for this visit:  History of adenomatous polyp of colon  Colovesical fistula  Hypertrophic scar  History of coronary artery bypass graft  Recurrent UTI    ASSESSMENT/PLAN  Pleasant gentleman with classic history physical CT scan drainage for colovesical fistula. Recurrent UTIs now on chronic Levaquin since history of Pseudomonas. Pneumaturia and occasional fecal urea. Standard care is segmental colonic resection. Would plan robotic sigmoid colectomy with primary repair of colovesical fistula.  Colonoscopy yesterday by Dr Fuller Plan confirms diverticulitis with stricture & abscess/fistula.  The anatomy & physiology of the digestive tract was discussed. The pathophysiology of colorectal fistula to the vagina was discussed. Natural history risks without surgery was discussed. I worked to give an overview of the disease and the frequent need to have multispecialty involvement.  I feel the risks of no intervention will lead to serious problems that outweigh the operative risks; therefore, I recommended surgery to treat the pathology. Minimally Invasive (robotic/laparoscopic) & open techniques for probable colectomy were discussed. Repair of the vaginal opening with possible need for intervening material to prevent recurrence such as omentum,  muscle, mesh, etc was discussed. Possible fecal diversion by ostomy was discussed. We will work to preserve anal & pelvic floor function without sacrificing cure.  Risks such as bleeding, infection, abscess, leak, injury to other organs, need for repair of tissues / organs, recurrence with reoperation, possible ostomy, hernia, heart attack, death, and other risks were discussed. I noted a good likelihood this will help address the problem. Goals of post-operative recovery were discussed as well. We will work to minimize complications. Educational information was given as well. Questions were answered.   The patient expresses understanding & wishes to proceed with surgery.   I would like to have cardiac clearance given his history of CABG. He has good exercise tolerance and is about due to follow-up with cardiology anyway. He is off Plavix and just on aspirin. Hopefully just a quick evaluation.  Given his history of a colovesical fistula and some question of hydronephrosis, I think it would be wise to start the case with ICG/Firefly cystoscopic injection of bladder and ureters to help delineate the anatomy and avoid injury or other concerns. Urology to do at the start of the case as well.  Agree with infectious disease that he should stay on his oral antibiotics until he gets his surgery. He is overdue to follow-up with [place] and get surgery. He is past the 6-week point of last admission, so we can do surgery with he is interested in proceeding.    [name], MD, FACS, MASCRS Esophageal, Gastrointestinal & Colorectal Surgery Robotic and Minimally Invasive Surgery  [facility], [employer]  [place]. [address], [place], [place] [address] [phone] Fax [phone] Main  CONTACT INFORMATION:  Weekday (9AM-5PM): Call [facility] main office at [phone]  Weeknight (5PM-9AM) or Weekend/Holiday: Check [email or web address] (password " [email or web address]")  for General Surgery [facility] coverage  (Please, do not use SecureChat as it is not reliable communication to operating surgeons for immediate patient care)    11/30/2021

## 2021-11-30 NOTE — Interval H&P Note (Signed)
History and Physical Interval Note:  11/30/2021 7:12 AM  Brian Mccullough  has presented today for surgery, with the diagnosis of COLOVESICAL FISTULA.  The various methods of treatment have been discussed with the patient and family. After consideration of risks, benefits and other options for treatment, the patient has consented to  Procedure(s): ROBOTIC RESECTION OF COLON, RECTOSIGMOID, POSSIBLE BLADDER REPAIR (N/A) POSSIBLE OSTOMY (N/A) RIGID PROCTOSCOPY (N/A) POSSIBLE CYSTOSCOPY with FIREFLY INJECTION (N/A) CYSTOSCOPY WITH RETROGRADE PYELOGRAM/URETERAL STENT PLACEMENT (N/A) as a surgical intervention.  The patient's history has been reviewed, patient examined, no change in status, stable for surgery.  I have reviewed the patient's chart and labs.  Questions were answered to the patient's satisfaction.    I have re-reviewed the the patient's records, history, medications, and allergies.  I have re-examined the patient.  I again discussed intraoperative plans and goals of post-operative recovery.  The patient agrees to proceed.  Brian Mccullough  Sep 13, 1951 [id number]  Patient Care Team: Cassandria Anger, MD as PCP - General (Internal Medicine) Sherren Mocha, MD as PCP - Cardiology (Cardiology) Marily Memos, MD (Orthopedic Surgery) Wonda Olds, MD as Consulting Physician (Cardiothoracic Surgery) Sharmon Revere as Physician Assistant (Cardiology) Szabat, Darnelle Maffucci, [facility] as Pharmacist (Pharmacist) Irine Seal, MD as Attending Physician (Urology) Ladene Artist, MD as Consulting Physician (Gastroenterology) Alyson Ingles Candee Furbish, MD as Consulting Physician (Urology) Michael Boston, MD as Consulting Physician (Colon and Rectal Surgery)  Patient Active Problem List   Diagnosis Date Noted   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    Priority: High   Pseudomonas urinary tract infection 10/11/2021   Pyelonephritis 08/13/2021   BPH (benign prostatic hyperplasia) 08/13/2021   AKI  (acute kidney injury) ([place]) 08/13/2021   Hyponatremia 08/13/2021   Prostatism 07/20/2021   Arthralgia 05/13/2021   Keloid scar 05/13/2021   Syncope 04/26/2021   Diarrhea 04/26/2021   Statin myopathy 03/29/2021   Carotid artery stenosis 12/02/2020   Elbow stiffness, left 09/27/2020   Coronary artery disease 08/17/2020   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   STEMI (ST elevation myocardial infarction) ([place]) 08/03/2020   STEMI involving right coronary artery ([place]) 08/03/2020   Trigger thumb of right hand 04/26/2020   Testicular pain, right 03/18/2018   Lightheadedness 04/09/2017   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   Stress at home 03/27/2016   Bladder neck obstruction 05/29/2014   [place] 05/29/2014   Acute bronchitis 04/19/2014   Traveler's diarrhea 02/02/2014   Right inguinal hernia 01/13/2014   Recurrent left inguinal hernia 01/13/2014   Rt groin pain 12/30/2013   Palpitations 06/19/2013   Right groin pain 03/27/2013   Well adult exam 10/07/2012   Hypogonadism male 05/29/2012   Erectile dysfunction 05/27/2012   Insomnia 05/29/2011   WEIGHT GAIN, ABNORMAL 05/30/2010   Dyslipidemia 01/31/2010   CHEST PAIN 01/31/2010   Groin pain, chronic, left 11/02/2009   LOW BACK PAIN 11/02/2009   DIZZINESS 03/02/2009   B12 deficiency 11/02/2008   ECZEMA 11/02/2008   Vitamin D deficiency 01/27/2008   Obesity 01/27/2008   ANXIETY 10/28/2007   ALCOHOLISM 10/28/2007   Osteoarthritis 10/28/2007   Diabetes mellitus type 2, controlled ([place]) 05/10/2007   HTN (hypertension) 05/10/2007    Past Medical History:  Diagnosis Date   Allergy    CAD (coronary artery disease)    s/p Inf STEMI 10/21>>POBA to RCA >> s/p CABG (L-LAD, S-PDA, L Radial-OM1/RI, RIMA-RPLA) // post op AF; SVT >> Amiod Rx // Echo 10/21: EF 55-60, no RWMA, GLS -18.5, normal RVSF, mild  LAE, trivial MR    Carotid artery disease ([place])    [place] 10/21: R 1-39; L 40-59   History of kidney stones    Hyperactive gag reflex    Hyperlipidemia     Hypertension    under control with meds., has been on med. x 30 yr.   Inguinal hernia 01/2014   bilateral   LBP (low back pain)    Non-insulin dependent type 2 diabetes mellitus (HCC)    OA (osteoarthritis) of knee 01/2004   left   Palpitations    s/p Inf STEMI 07/2020   POBA to RCA >> CABG   Vitamin B 12 deficiency     Past Surgical History:  Procedure Laterality Date   CARDIAC CATHETERIZATION     CORONARY ARTERY BYPASS GRAFT N/A 08/04/2020   Procedure: CORONARY ARTERY BYPASS GRAFTING (CABG), ON PUMP, TIMES SIX, USING BILATERAL INTERNAL MAMMARY ARTERIES, RIGHT ENDOSCOPICALLY HARVESTED GREATER SAPHENOUS VEIN, AND RIGHT RADIAL ARTERY OPEN HARVEST;  Surgeon: Wonda Olds, MD;  Location: [place];  Service: Open Heart Surgery;  Laterality: N/A;   CORONARY/GRAFT ACUTE MI REVASCULARIZATION N/A 08/03/2020   Procedure: Coronary/Graft Acute MI Revascularization;  Surgeon: Sherren Mocha, MD;  Location: [place] [facility];  Service: Cardiovascular;  Laterality: N/A;   ENDOVEIN HARVEST OF GREATER SAPHENOUS VEIN Right 08/04/2020   Procedure: ENDOVEIN HARVEST OF GREATER SAPHENOUS VEIN;  Surgeon: Wonda Olds, MD;  Location: [place];  Service: Open Heart Surgery;  Laterality: Right;   INGUINAL HERNIA REPAIR  age [age]   INGUINAL HERNIA REPAIR Bilateral 02/09/2014   Procedure: OPEN BILATERAL INGUINAL HERNIA REPAIR WITH MESH;  Surgeon: [name], MD;  Location: [place];  Service: General;  Laterality: Bilateral;   INSERTION OF MESH Bilateral 02/09/2014   Procedure: INSERTION OF MESH;  Surgeon: [name], MD;  Location: [place];  Service: General;  Laterality: Bilateral;   KNEE ARTHROSCOPY Right    LEFT HEART CATH AND CORONARY ANGIOGRAPHY N/A 08/03/2020   Procedure: LEFT HEART CATH AND CORONARY ANGIOGRAPHY;  Surgeon: Sherren Mocha, MD;  Location: [place] [facility];  Service: Cardiovascular;  Laterality: N/A;   ORCHIECTOMY Left 05/31/2017    Procedure: LEFT RADICAL ORCHIECTOMY;  Surgeon: Irine Seal, MD;  Location[facility];  Service: Urology;  Laterality: Left;   RADIAL ARTERY HARVEST Right 08/04/2020   Procedure: RADIAL ARTERY HARVEST;  Surgeon: Wonda Olds, MD;  Location: [place];  Service: Open Heart Surgery;  Laterality: Right;   TEE WITHOUT CARDIOVERSION N/A 08/04/2020   Procedure: TRANSESOPHAGEAL ECHOCARDIOGRAM (TEE);  Surgeon: Wonda Olds, MD;  Location: [place];  Service: Open Heart Surgery;  Laterality: N/A;   TONSILLECTOMY AND ADENOIDECTOMY  1960   TOTAL KNEE ARTHROPLASTY Left 01/14/2004    Social History   Socioeconomic History   Marital status: Married    Spouse name: Not on file   Number of children: Not on file   Years of education: 12   Highest education level: Not on file  Occupational History   Occupation: diabled    Employer: UNEMPLOYED    Comment: due to knees  Tobacco Use   Smoking status: Former    Types: Cigars    Quit date: 08/03/2020    Years since quitting: 1.3   Smokeless tobacco: Never   Tobacco comments:    2-3 small cigars/day  Vaping Use   Vaping Use: Never used  Substance and Sexual Activity   Alcohol use: No   Drug  use: No   Sexual activity: Yes  Other Topics Concern   Not on file  Social History Narrative   Not on file   Social Determinants of Health   Financial Resource Strain: Low Risk    Difficulty of Paying Living Expenses: Not hard at all  Food Insecurity: No Food Insecurity   Worried About Charity fundraiser in the Last Year: Never true   [place] in the Last Year: Never true  Transportation Needs: No Transportation Needs   Lack of Transportation (Medical): No   Lack of Transportation (Non-Medical): No  Physical Activity: Sufficiently Active   Days of Exercise per Week: 5 days   Minutes of Exercise per Session: 30 min  Stress: No Stress Concern Present   Feeling of Stress : Not at all  Social Connections: Moderately Isolated    Frequency of Communication with Friends and Family: More than three times a week   Frequency of Social Gatherings with Friends and Family: More than three times a week   Attends Religious Services: Never   Marine scientist or Organizations: No   Attends Music therapist: Never   Marital Status: Married  Human resources officer Violence: Not At Risk   Fear of Current or Ex-Partner: No   Emotionally Abused: No   Physically Abused: No   Sexually Abused: No    Family History  Problem Relation Age of Onset   Hypertension Mother    Diabetes Mother    Diabetes Father    Colon cancer Neg Hx    Esophageal cancer Neg Hx    Rectal cancer Neg Hx    Stomach cancer Neg Hx    Colon polyps Neg Hx     Medications Prior to Admission  Medication Sig Dispense Refill Last Dose   acetaminophen (TYLENOL) 500 MG tablet Take 1,000 mg by mouth every 6 (six) hours as needed for headache or moderate pain.   Past Month   aspirin 81 MG EC tablet Take 81 mg by mouth daily.   Past Week   atorvastatin (LIPITOR) 40 MG tablet Take 1 tablet (40 mg total) by mouth daily. (Patient taking differently: Take 40 mg by mouth every other day.) 90 tablet 3 11/30/2021 at 0400   benazepril (LOTENSIN) 20 MG tablet TAKE 1 TABLET BY MOUTH  DAILY 90 tablet 3 11/30/2021 at 0400   cefUROXime (CEFTIN) 500 MG tablet Take 1 tablet (500 mg total) by mouth 2 (two) times daily with a meal. (Patient taking differently: Take 500 mg by mouth at bedtime.) 28 tablet 0 11/27/2021   cholecalciferol (VITAMIN D3) 25 MCG (1000 UNIT) tablet Take 1,000 Units by mouth daily.   Past Month   cyanocobalamin 1000 MCG tablet Take 1,000 mcg by mouth daily.   Past Month   feeding supplement (ENSURE ENLIVE / ENSURE PLUS) LIQD Take 237 mLs by mouth 2 (two) times daily between meals. 237 mL 12 11/29/2021   furosemide (LASIX) 20 MG tablet TAKE 1 TABLET BY MOUTH AS NEEDED FOR FLUID OR EDEMA 90 tablet 2 Past Month   loratadine (CLARITIN) 10 MG tablet Take  10 mg by mouth daily as needed for allergies or rhinitis.   Past Week   metoprolol tartrate (LOPRESSOR) 25 MG tablet Take 0.5 tablets (12.5 mg total) by mouth 2 (two) times daily. 15 tablet 0 11/30/2021 at 0400   triamcinolone ointment (KENALOG) 0.1 % Apply 1 application topically daily as needed (rash).   Past Week   meloxicam (MOBIC)  15 MG tablet TAKE 1 TABLET BY MOUTH  DAILY AS NEEDED FOR PAIN 90 tablet 3 More than a month   metFORMIN (GLUCOPHAGE) 500 MG tablet TAKE 1 TABLET BY MOUTH  DAILY WITH BREAKFAST 90 tablet 3 11/28/2021   Multiple Vitamin (MULTIVITAMIN WITH MINERALS) TABS tablet Take 1 tablet by mouth daily. 30 tablet 0 More than a month   Nutritional Supplements (,FEEDING SUPPLEMENT, PROSOURCE PLUS) liquid Take 30 mLs by mouth daily. 887 mL 0 More than a month   tamsulosin (FLOMAX) 0.4 MG CAPS capsule Take 1 capsule (0.4 mg total) by mouth daily. (Patient not taking: Reported on 11/15/2021) 90 capsule 3 Not Taking   tiZANidine (ZANAFLEX) 4 MG tablet Take 1 tablet (4 mg total) by mouth every 8 (eight) hours as needed for muscle spasms. (Patient not taking: Reported on 11/22/2021) 90 tablet 0 More than a month   traZODone (DESYREL) 50 MG tablet TAKE 1/2 TO 1 TABLET BY  MOUTH AT BEDTIME AS NEEDED  FOR SLEEP (Patient not taking: Reported on 11/22/2021) 90 tablet 3 More than a month    Current Facility-Administered Medications  Medication Dose Route Frequency Provider Last Rate Last Admin   bupivacaine liposome (EXPAREL) 1.3 % injection 266 mg  20 mL Infiltration Once Michael Boston, MD       cefoTEtan (CEFOTAN) 2 g in sodium chloride 0.9 % 100 mL IVPB  2 g Intravenous On Call to OR Michael Boston, MD       lactated ringers infusion   Intravenous Continuous Santa Lighter, MD 10 mL/hr at 11/30/21 0640 New Bag at 11/30/21 0640     Allergies  Allergen Reactions   Codeine Nausea Only   Levaquin [Levofloxacin]     Can take it but feels achy    BP 139/81    Pulse (!) 57    Temp 99.1 F (37.3 C)  (Oral)    Resp 18    Ht 6\' 3"  (1.905 m)    Wt 108.4 kg    SpO2 100%    BMI 29.87 kg/m   Labs: Results for orders placed or performed during the hospital encounter of 11/30/21 (from the past 48 hour(s))  Glucose, capillary     Status: None   Collection Time: 11/30/21  6:32 AM  Result Value Ref Range   Glucose-Capillary 91 70 - 99 mg/dL    Comment: Glucose reference range applies only to samples taken after fasting for at least 8 hours.   Comment 1 Notify RN    Comment 2 Document in Chart     Imaging / Studies: No results found.   [name], M.D., F.A.C.S. Gastrointestinal and Minimally Invasive Surgery [facility] [address] [place],  [address] [phone] Main / Paging  11/30/2021 7:12 AM    [name]

## 2021-11-30 NOTE — Progress Notes (Signed)
°  Transition of Care [facility]) Screening Note   Patient Details  Name: Brian Mccullough Date of Birth: 1951/04/03   Transition of Care [facility]) CM/SW Contact:    Lennart Pall, LCSW Phone Number: 11/30/2021, 2:12 PM    Transition of Care Department [facility]) has reviewed patient and no TOC needs have been identified at this time. We will continue to monitor patient advancement through interdisciplinary progression rounds. If new patient transition needs arise, please place a TOC consult.

## 2021-11-30 NOTE — Anesthesia Postprocedure Evaluation (Signed)
Anesthesia Post Note  Patient: FOY VANDUYNE  Procedure(s) Performed: ROBOTIC LOW ANTERIOR RESECTION OF COLON, RECTOSIGMOID, ASSESSMENT OF PERFUSSION WITH FIREFLY, BILATERAL TAP BLOCK RIGID PROCTOSCOPY POSSIBLE CYSTOSCOPY with FIREFLY INJECTION     Patient location during evaluation: PACU Anesthesia Type: General Level of consciousness: awake and alert Pain management: pain level controlled Vital Signs Assessment: post-procedure vital signs reviewed and stable Respiratory status: spontaneous breathing, nonlabored ventilation, respiratory function stable and patient connected to nasal cannula oxygen Cardiovascular status: blood pressure returned to baseline and stable Postop Assessment: no apparent nausea or vomiting Anesthetic complications: no   No notable events documented.  Last Vitals:  Vitals:   11/30/21 1415 11/30/21 1515  BP: (!) 144/79 (!) 142/79  Pulse: 70 (!) 53  Resp: 18 18  Temp: 37 C 36.7 C  SpO2: 100% 100%    Last Pain:  Vitals:   11/30/21 1515  TempSrc: Oral  PainSc:                  Effie Berkshire

## 2021-11-30 NOTE — Anesthesia Preprocedure Evaluation (Addendum)
Anesthesia Evaluation  Patient identified by MRN, date of birth, ID band Patient awake    Reviewed: Allergy & Precautions, NPO status , Patient's Chart, lab work & pertinent test results  Airway Mallampati: I  TM Distance: >3 FB Neck ROM: Full    Dental  (+) Dental Advisory Given, Edentulous Lower, Edentulous Upper   Pulmonary former smoker,    breath sounds clear to auscultation       Cardiovascular hypertension, Pt. on medications and Pt. on home beta blockers + CAD and + CABG   Rhythm:Regular Rate:Normal  Echo: 1. Left ventricular ejection fraction, by estimation, is 60 to 65%. The  left ventricle has normal function. The left ventricle has no regional  wall motion abnormalities. Left ventricular diastolic parameters were  normal.  2. Right ventricular systolic function is normal. The right ventricular  size is normal.  3. Left atrial size was mildly dilated.  4. The mitral valve is normal in structure. No evidence of mitral valve  regurgitation. No evidence of mitral stenosis.  5. The aortic valve is normal in structure. Aortic valve regurgitation is  not visualized. No aortic stenosis is present.  6. The inferior vena cava is normal in size with greater than 50%  respiratory variability, suggesting right atrial pressure of 3 mmHg.    Neuro/Psych Anxiety  Neuromuscular disease    GI/Hepatic negative GI ROS, Neg liver ROS,   Endo/Other  diabetes, Type 2, Oral Hypoglycemic Agents  Renal/GU Renal disease     Musculoskeletal  (+) Arthritis ,   Abdominal   Peds  Hematology   Anesthesia Other Findings   Reproductive/Obstetrics                            Anesthesia Physical Anesthesia Plan  ASA: 3  Anesthesia Plan: General   Post-op Pain Management:    Induction: Intravenous  PONV Risk Score and Plan: 3 and Ondansetron, Dexamethasone, Midazolam and Treatment may vary due to  age or medical condition  Airway Management Planned: Oral ETT  Additional Equipment: None  Intra-op Plan:   Post-operative Plan: Extubation in OR  Informed Consent: I have reviewed the patients History and Physical, chart, labs and discussed the procedure including the risks, benefits and alternatives for the proposed anesthesia with the patient or authorized representative who has indicated his/her understanding and acceptance.       Plan Discussed with: CRNA  Anesthesia Plan Comments: (- 2 IV's)       Anesthesia Quick Evaluation

## 2021-11-30 NOTE — Progress Notes (Signed)
I discussed operative findings, updated the patient's status, discussed probable steps to recovery, and gave postoperative recommendations to the patient's spouse.  Recommendations were made.  Questions were answered.  She expressed understanding & appreciation.

## 2021-11-30 NOTE — Op Note (Deleted)
Operative Note  Brian Mccullough  [id number]  [id number]  11/30/2021   Surgeon: Romana Juniper MD FACS   Procedure performed: Open inguinal hernia repair with UltraPro mesh: right indirect    Preop diagnosis:  right inguinal hernia   Post-op diagnosis/intraop findings: indirect inguinal hernia   Specimens: none   EBL: 95GL   Complications: none   Description of procedure: After obtaining informed consent the patient was taken to the operating room and placed supine on operating room table where general anesthesia was initiated, preoperative antibiotics were administered, SCDs applied, and a formal timeout was performed. The groin was clipped, prepped and draped in the usual sterile fashion. An oblique incision was made in the just above the inguinal ligament after infiltrating the tissues with local anesthetic (exparel mixed with 0.25% marcaine with epi). Soft tissues were dissected using electrocautery until the external oblique aponeurosis was encountered. This was divided sharply to expand the external ring. A plane was bluntly developed between the spermatic cord and the external oblique. The ilioinguinal nerve was divided between hemostats and each and ligated with 3-0 Vicryl ties. The spermatic cord was then bluntly dissected away from the pubic tubercle and encircled with a Penrose. Inspection of the inguinal anatomy revealed a large indirect hernia sac with a fair amount of fat infiltration. The indirect hernia sac was bluntly dissected away from the cord structures and skeletonized down to the level of the internal ring.  The hernia sac was reduced intact and then the internal ring and inguinal floor were reapproximated with interrupted 2-0 PDS sutures to reconstruct the inguinal floor and created an internal ring that was just large enough for the spermatic cord structures to exit.  A 3 x 6 piece of ultra Pro mesh was brought onto the field and trimmed to approximate the field. This was  sutured to the pubic tubercle fascia using 0 ethibond. Interrupted 0 ethibonds were then used to suture the mesh to the inferior shelving edge and to the internal oblique superiorly. The tails of the mesh were wrapped around the spermatic cord, ensuring adequate room for the cord, and sutured to each other with 0 ethibond, and then directed laterally to lie flat beneath the external oblique aponeurosis.  An additional 0 Ethibond was placed just medial to the cord superficially to secure the mesh medially to the inguinal floor repair.  Hemostasis was ensured within the wound. The Penrose was removed. The external oblique aponeurosis was reapproximated with a running 3-0 Vicryl to re-create a narrowed external ring. More local was infiltrated around the pubic tubercle and in the plane just below the external oblique. The Scarpa's was reapproximated with interrupted 3-0 Vicryls. The skin was closed with a running subcuticular 4-0 Monocryl. The remainder of the local was injected in the subcutaneous and subcuticular space. The field was then cleaned, benzoin and Steri-Strips and sterile bandage were applied. Both testicles were palpated in the scrotum at the end of the case. The patient was then awakened extubated and taken to PACU in stable condition.    All counts were correct at the completion of the case

## 2021-11-30 NOTE — Op Note (Signed)
11/30/2021  11:36 AM  PATIENT:  Brian Mccullough  71 y.o. male  Patient Care Team: [name], Evie Lacks, MD as PCP - General (Internal Medicine) Sherren Mocha, MD as PCP - Cardiology (Cardiology) Marily Memos, MD (Orthopedic Surgery) Wonda Olds, MD as Consulting Physician (Cardiothoracic Surgery) Sharmon Revere as Physician Assistant (Cardiology) Szabat, Darnelle Maffucci, [facility] as Pharmacist (Pharmacist) Irine Seal, MD as Attending Physician (Urology) Ladene Artist, MD as Consulting Physician (Gastroenterology) Alyson Ingles Candee Furbish, MD as Consulting Physician (Urology) Michael Boston, MD as Consulting Physician (Colon and Rectal Surgery)  PRE-OPERATIVE DIAGNOSIS:  COLOVESICAL FISTULA  POST-OPERATIVE DIAGNOSIS:   COLOVESICAL FISTULA SIGMOID DIVERTICULITIS  PROCEDURE:   ROBOTIC LOW ANTERIOR RECTOSIGMOID RESECTION ASSESSMENT OF PERFUSSION WITH FIREFLY BILATERAL TAP BLOCK RIGID PROCTOSCOPY  SURGEON:  [name], MD  ASSISTANT: Nadeen Landau, MD An experienced assistant was required given the standard of surgical care given the complexity of the case.  This assistant was needed for exposure, dissection, suction, tissue approximation, retraction, perception, etc.  ANESTHESIA:     General  Regional TRANSVERSUS ABDOMINIS PLANE (TAP) nerve block for perioperative & postoperative pain control provided with liposomal bupivacaine (Experel) mixed with 0.25% bupivacaine as a Bilateral TAP block x 67mL each side at the level of the transverse abdominis & preperitoneal spaces along the flank at the anterior axillary line, from subcostal ridge to iliac crest under laparoscopic guidance   Local field block at port sites & extraction wound  EBL:  Total I/O In: 100 [IV Piggyback:100] Out: 300 [Urine:250; Blood:50]  Delay start of Pharmacological VTE agent (>24hrs) due to surgical blood loss or risk of bleeding:  no  DRAINS: No  SPECIMEN:   RECTOSIGMOID COLON (open  end proximal) DISTAL ANASTOMOTIC RING (final distal margin)  DISPOSITION OF SPECIMEN:  PATHOLOGY  COUNTS:  YES  PLAN OF CARE: Admit to inpatient   PATIENT DISPOSITION:  PACU - hemodynamically stable.  INDICATION:    Patient with symptoms of UTI growing Pseudomonas and underwent work-up consistent with colovesical fistula.  Diverticulitis etiology suspected.  Stabilized antibiotics.  Colonoscopy yesterday ruled out any obvious malignancy.  Cleared by cardiology.  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  I recommended segmental resection:  The anatomy & physiology of the digestive tract was discussed.  The pathophysiology was discussed.  Natural history risks without surgery was discussed.   I worked to give an overview of the disease and the frequent need to have multispecialty involvement.  I feel the risks of no intervention will lead to serious problems that outweigh the operative risks; therefore, I recommended a partial colectomy to remove the pathology.  Laparoscopic & open techniques were discussed.   Risks such as bleeding, infection, abscess, leak, reoperation, possible ostomy, hernia, heart attack, death, and other risks were discussed.  I noted a good likelihood this will help address the problem.   Goals of post-operative recovery were discussed as well.  We will work to minimize complications.  Educational materials on the pathology had been given in the office.  Questions were answered.    The patient expressed understanding & wished to proceed with surgery.  OR FINDINGS:   Patient had inflamed diverticulitis at the descending/sigmoid junction stretched down to an densely adherent to the left posterior bladder and close to the left distal ureter.  Consistent with diverticulitis.  Rather twisted upon itself.  No active abscess.  No active hole/fistula to the bladder today.  No obvious metastatic disease on  visceral parietal peritoneum  or liver.  The anastomosis rests 12 cm from the anal verge by rigid proctoscopy.  CASE DATA:  Type of patient?: Elective [facility] Private Case  Status of Case? Elective Scheduled  Infection Present At Time Of Surgery (PATOS)?  PHLEGMON  DESCRIPTION:   Informed consent was confirmed.  The patient underwent general anaesthesia without difficulty.  The patient was positioned appropriately.  VTE prevention in place.  Dr. Cain Sieve was able to do cystoscopy with firefly ICG injection to help identify the genitourinary anatomy given the colovesical fistula.  Please see his operative note.  The patient was clipped, prepped, & draped in a sterile fashion.  Surgical timeout confirmed our plan.  The patient was positioned in reverse Trendelenburg.  Abdominal entry was gained using Varess technique at the left subcostal ridge on the anterior abdominal wall.  No elevated EtCO2 noted.  Port placed.  Camera inspection revealed no injury.  Extra ports were carefully placed under direct laparoscopic visualization.  Upon entering the abdomen (organ space), I encountered inflamed phlegmonous segment of colon at the descending sigmoid junction densely adherent to the left anterolateral pelvis consistent with the epicenter of the colovesical fistula.   I reflected the greater omentum and the upper abdomen the small bowel in the upper abdomen.  The patient was carefully positioned.  The [employer] daVinci robot was docked with camera & instruments carefully placed.  The patient had redundant sigmoid colon.  I alternated between lateral to medial medial or lateral dissection to help untwisted corkscrewing of the redundant sigmoid colon upon itself.   We could see the right ureter in its natural right retroperitoneal position and not involved with the rectosigmoid mesentery.  We left that alone I mobilized the rectosigmoid colon & elevated it to put the main pedicle on tension.  I scored the base of peritoneum of the medial side  of the mesentery of the elevated left colon from the ligament of Treitz to the mid rectum.   I elevated the sigmoid mesentery and entered into the retro-mesenteric plane. We were able to identify the left ureter and gonadal vessels. We kept those posterior within the retroperitoneum and elevated the left colon mesentery off that.  Some dense inflammation adhesions in the left lateral region but was able to safely come off of those and keep the ureter safe from injury.  I did isolate the inferior mesenteric artery (IMA) pedicle but did not ligate it yet.  I continued distally and got into the avascular plane posterior to the mesorectum, sparing the nervi ergentes.. This allowed me to help mobilize the rectum as well by freeing the mesorectum off the sacrum.  I stayed away from the right and left ureters.  I kept the lateral vascular pedicles to the rectum intact.  I transanally passed up EEA sizers blunt tipped proximally.  Went from 25 to 33 mm.  They passed up easily without resistance.  No pelvic twisting or turning or stricturing.  I chose an area in the proximal rectum since there had been moderate inflammation to the bladder and transected through the mesorectum robotically safely to good result.  We asked the circulator to insufflate the bladder with a three-way Foley with methylene blue dyed solution.  Eventually got good insufflation of the bladder with no evidence of any leak arguing against any active colovesical fistula.  Reassuring.  Bladder decompressed.  I mobilized the left colon in a lateral to medial fashion off the retroperitoneum and sidewall attachments along the line  of Toldt up towards the splenic flexure to ensure good mobilization of the remaining left colon to reach into the pelvis.  His left colon mesentery is actually quite stretchy signed up transecting the mesentery radially sparing a good left colic artery pedicle toward an area in the mid descending colon that reached down away  proximal from the inflammation.  To access vascular perfusion of tissues, we asked anesthesia use intravenous  indocyanine green (ICG) with IV flush.  I switched to the NIR fluorescence (Firefly mode) imaging window on the daVinci robot platform.  We were able to see good light green visualization of blood vessels with good vascular perfusion of tissues, confirming good tissue perfusion of tissues (mid descending colon and proximal rectum) planned for anastomosis.  With that we transected at the proximal rectum using a green load 60 mm stapler with 1 firing to good result.  Did inspection in shock with good hemostasis and no injury or other abnormality.  We created an extraction incision through a small Pfannenstiel incision in the suprapubic region.  Placed a wound protector.  I was able to eviscerate the rectosigmoid and descending colon out the wound.   I clamped the colon proximal to this area using a reusable pursestringer device.  Passed a 2-0 Keith needle. I transected at the descending/sigmoid junction with a scalpel. I got healthy bleeding mucosa.  We sent the rectosigmoid colon specimen off to go to pathology.  We sized the colon orifice.  I chose a 28mm EEA anvil stapler system.  I reinforced the prolene pursestring with interrupted silk "belt loop" sutures.  I placed the anvil to the open end of the proximal remaining colon and closed around it using the pursestring.    We did copious irrigation with crystalloid solution.  Hemostasis was good.  The distal end of the remaining colon easily reached down to the rectal stump, therefore, splenic flexure mobilization was not needed.      Dr Dema Severin scrubbed down and did gentle anal dilation and advanced the EEA stapler up the rectal stump. The spike was brought out at the provimal end of the rectal stump under direct visualization.  I  attached the anvil of the proximal colon the spike of the stapler. Anvil was tightened down and held clamped for 60  seconds.  Orientation was confirmed such that there is no twisting of the colon nor small bowel underneath the mesenteric defect. No concerning tension.  The EEA stapler was fired and held clamped for 30 seconds. The stapler was released & removed. Blue stitch is in the proximal ring.  Care was taken to ensure no other structures were incorporated within this either.  We noted 2 excellent anastomotic rings.   The colon proximal to the anastomosis was then gently occluded. The pelvis was filled with sterile irrigation.  Dr Dema Severin  did rigid proctoscopy noted the anastomosis was at 12 cm from the anal verge consistent with the proximal rectum.  There was a negative air leak test. There was no tension of mesentery or bowel at the anastomosis.   Tissues looked viable.  Ureters & bowel uninjured.  The anastomosis looked healthy. Greater omentum positioned down into the pelvis to help protect the anastomosis.  Endoluminal gas was evacuated.  Ports & wound protector removed.  We changed gloves & redraped the patient per colon SSI prevention protocol.  We aspirated irrigation.  Hemostasis was good.  Sterile unused instruments were used from this point.  I closed the skin at the  port sites using Monocryl stitch and sterile dressing.  I closed the extraction wound using a 0 Vicryl vertical peritoneal closure and a #1 PDS transverse anterior rectal fascial closure like a small Pfannenstiel closure. I closed the skin with some interrupted Monocryl stitches.  Because of his history of hypertrophic scarring and keloid I further injected at the port sites and Pfannenstiel incision dermally with Kenalog 40 diluted up to 10 mL to hopefully minimize the chance of hypertrophic/keloid scarring.  I placed sterile dressings.     Patient was extubated and is hemodynamically stable in the PACU recovery room. I had discussed postop care with the patient in detail the office & in the holding area. Instructions are written.I made an  attempt to locate & reach the desired patient contact (his wife, Brian Mccullough) to discuss the patient's overall status and my recommendations.  No one is available at this time.  My plans & instructions have been written in the chart.  We will try and reach her later.  [name], M.D., F.A.C.S. Gastrointestinal and Minimally Invasive Surgery [facility] [address] [place], [place] [address] [phone] Main / Paging

## 2021-11-30 NOTE — Discharge Instructions (Signed)
SURGERY: POST OP INSTRUCTIONS (Surgery for small bowel obstruction, colon resection, etc)   ######################################################################  EAT Gradually transition to a high fiber diet with a fiber supplement over the next few days after discharge  WALK Walk an hour a day.  Control your pain to do that.    CONTROL PAIN Control pain so that you can walk, sleep, tolerate sneezing/coughing, go up/down stairs.  HAVE A BOWEL MOVEMENT DAILY Keep your bowels regular to avoid problems.  OK to try a laxative to override constipation.  OK to use an antidairrheal to slow down diarrhea.  Call if not better after 2 tries  CALL IF YOU HAVE PROBLEMS/CONCERNS Call if you are still struggling despite following these instructions. Call if you have concerns not answered by these instructions  ######################################################################   DIET Follow a light diet the first few days at home.  Start with a bland diet such as soups, liquids, starchy foods, low fat foods, etc.  If you feel full, bloated, or constipated, stay on a ful liquid or pureed/blenderized diet for a few days until you feel better and no longer constipated. Be sure to drink plenty of fluids every day to avoid getting dehydrated (feeling dizzy, not urinating, etc.). Gradually add a fiber supplement to your diet over the next week.  Gradually get back to a regular solid diet.  Avoid fast food or heavy meals the first week as you are more likely to get nauseated. It is expected for your digestive tract to need a few months to get back to normal.  It is common for your bowel movements and stools to be irregular.  You will have occasional bloating and cramping that should eventually fade away.  Until you are eating solid food normally, off all pain medications, and back to regular activities; your bowels will not be normal. Focus on eating a low-fat, high fiber diet the rest of your life  (See Getting to [place], below).  CARE of your INCISION or WOUND It is good for closed incision and even open wounds to be washed every day.  Shower every day.  Short baths are fine.  Wash the incisions and wounds clean with soap & water.     If you have a closed incision(s), wash the incision with soap & water every day.  You may leave closed incisions open to air if it is dry.   You may cover the incision with clean gauze & replace it after your daily shower for comfort.  It is good for closed incisions and even open wounds to be washed every day.  Shower every day.  Short baths are fine.  Wash the incisions and wounds clean with soap & water.    You may leave closed incisions open to air if it is dry.   You may cover the incision with clean gauze & replace it after your daily shower for comfort.  TEGADERM:  You have clear gauze band-aid dressings over your closed incision(s).  Remove the dressings 3 days after surgery. = 2/25 Saturday   If you have an open wound with a wound vac, see wound vac care instructions.     ACTIVITIES as tolerated Start light daily activities --- self-care, walking, climbing stairs-- beginning the day after surgery.  Gradually increase activities as tolerated.  Control your pain to be active.  Stop when you are tired.  Ideally, walk several times a day, eventually an hour a day.   Most people are back to most  day-to-day activities in a few weeks.  It takes 4-8 weeks to get back to unrestricted, intense activity. If you can walk 30 minutes without difficulty, it is safe to try more intense activity such as jogging, treadmill, bicycling, low-impact aerobics, swimming, etc. Save the most intensive and strenuous activity for last (Usually 4-8 weeks after surgery) such as sit-ups, heavy lifting, contact sports, etc.  Refrain from any intense heavy lifting or straining until you are off narcotics for pain control.  You will have off days, but things should  improve week-by-week. DO NOT PUSH THROUGH PAIN.  Let pain be your guide: If it hurts to do something, don't do it.  Pain is your body warning you to avoid that activity for another week until the pain goes down. You may drive when you are no longer taking narcotic prescription pain medication, you can comfortably wear a seatbelt, and you can safely make sudden turns/stops to protect yourself without hesitating due to pain. You may have sexual intercourse when it is comfortable. If it hurts to do something, stop.  MEDICATIONS Take your usually prescribed home medications unless otherwise directed.   Blood thinners:  Usually you can restart any strong blood thinners after the second postoperative day.  It is OK to take aspirin right away.     If you are on strong blood thinners (warfarin/Coumadin, Plavix, Xerelto, Eliquis, Pradaxa, etc), discuss with your surgeon, medicine PCP, and/or cardiologist for instructions on when to restart the blood thinner & if blood monitoring is needed (PT/INR blood check, etc).     PAIN CONTROL Pain after surgery or related to activity is often due to strain/injury to muscle, tendon, nerves and/or incisions.  This pain is usually short-term and will improve in a few months.  To help speed the process of healing and to get back to regular activity more quickly, DO THE FOLLOWING THINGS TOGETHER: Increase activity gradually.  DO NOT PUSH THROUGH PAIN Use Ice and/or Heat Try Gentle Massage and/or Stretching Take over the counter pain medication Take Narcotic prescription pain medication for more severe pain  Good pain control = faster recovery.  It is better to take more medicine to be more active than to stay in bed all day to avoid medications.  Increase activity gradually Avoid heavy lifting at first, then increase to lifting as tolerated over the next 6 weeks. Do not push through the pain.  Listen to your body and avoid positions and maneuvers than reproduce the  pain.  Wait a few days before trying something more intense Walking an hour a day is encouraged to help your body recover faster and more safely.  Start slowly and stop when getting sore.  If you can walk 30 minutes without stopping or pain, you can try more intense activity (running, jogging, aerobics, cycling, swimming, treadmill, sex, sports, weightlifting, etc.) Remember: If it hurts to do it, then dont do it! Use Ice and/or Heat You will have swelling and bruising around the incisions.  This will take several weeks to resolve. Ice packs or heating pads (6-8 times a day, 30-60 minutes at a time) will help sooth soreness & bruising. Some people prefer to use ice alone, heat alone, or alternate between ice & heat.  Experiment and see what works best for you.  Consider trying ice for the first few days to help decrease swelling and bruising; then, switch to heat to help relax sore spots and speed recovery. Shower every day.  Short baths are fine.  It feels good!  Keep the incisions and wounds clean with soap & water.   Try Gentle Massage and/or Stretching Massage at the area of pain many times a day Stop if you feel pain - do not overdo it Take over the counter pain medication This helps the muscle and nerve tissues become less irritable and calm down faster Choose ONE of the following over-the-counter anti-inflammatory medications: Acetaminophen 500mg  tabs (Tylenol) 1-2 pills with every meal and just before bedtime (avoid if you have liver problems or if you have acetaminophen in you narcotic prescription) Naproxen 220mg  tabs (ex. Aleve, Naprosyn) 1-2 pills twice a day (avoid if you have kidney, stomach, IBD, or bleeding problems) Ibuprofen 200mg  tabs (ex. Advil, Motrin) 3-4 pills with every meal and just before bedtime (avoid if you have kidney, stomach, IBD, or bleeding problems) Take with food/snack several times a day as directed for at least 2 weeks to help keep pain / soreness down & more  manageable. Take Narcotic prescription pain medication for more severe pain A prescription for strong pain control is often given to you upon discharge (for example: oxycodone/Percocet, hydrocodone/Norco/Vicodin, or tramadol/Ultram) Take your pain medication as prescribed. Be mindful that most narcotic prescriptions contain Tylenol (acetaminophen) as well - avoid taking too much Tylenol. If you are having problems/concerns with the prescription medicine (does not control pain, nausea, vomiting, rash, itching, etc.), please call us [phone] to see if we need to switch you to a different pain medicine that will work better for you and/or control your side effects better. If you need a refill on your pain medication, you must call the office before 4 pm and on weekdays only.  By federal law, prescriptions for narcotics cannot be called into a pharmacy.  They must be filled out on paper & picked up from our office by the patient or authorized caretaker.  Prescriptions cannot be filled after 4 pm nor on weekends.    WHEN TO CALL us [phone] Severe uncontrolled or worsening pain  Fever over 101 F (38.5 C) Concerns with the incision: Worsening pain, redness, rash/hives, swelling, bleeding, or drainage Reactions / problems with new medications (itching, rash, hives, nausea, etc.) Nausea and/or vomiting Difficulty urinating Difficulty breathing Worsening fatigue, dizziness, lightheadedness, blurred vision Other concerns If you are not getting better after two weeks or are noticing you are getting worse, contact our office [phone] for further advice.  We may need to adjust your medications, re-evaluate you in the office, send you to the emergency room, or see what other things we can do to help. The clinic staff is available to answer your questions during regular business hours (8:30am-5pm).  Please dont hesitate to call and ask to speak to one of our nurses for clinical concerns.    A  surgeon from [facility] is always on call at the hospitals 24 hours/day If you have a medical emergency, go to the nearest emergency room or call 911.  FOLLOW UP in our office One the day of your discharge from the hospital (or the next business weekday), please call [facility] to set up or confirm an appointment to see your surgeon in the office for a follow-up appointment.  Usually it is 2-3 weeks after your surgery.   If you have skin staples at your incision(s), let the office know so we can set up a time in the office for the nurse to remove them (usually around 10 days after surgery). Make sure  that you call for appointments the day of discharge (or the next business weekday) from the hospital to ensure a convenient appointment time. IF YOU HAVE DISABILITY OR FAMILY LEAVE FORMS, BRING THEM TO THE OFFICE FOR PROCESSING.  DO NOT GIVE THEM TO YOUR DOCTOR.  [facility] [address] [place], [place], [place]  [address] ? [phone] - Main [phone] - [place],  [phone] - Fax [email or web address]  GETTING TO GOOD BOWEL HEALTH. It is expected for your digestive tract to need a few months to get back to normal.  It is common for your bowel movements and stools to be irregular.  You will have occasional bloating and cramping that should eventually fade away.  Until you are eating solid food normally, off all pain medications, and back to regular activities; your bowels will not be normal.   Avoiding constipation The goal: ONE SOFT BOWEL MOVEMENT A DAY!    Drink plenty of fluids.  Choose water first. TAKE A FIBER SUPPLEMENT EVERY DAY THE REST OF YOUR LIFE During your first week back home, gradually add back a fiber supplement every day Experiment which form you can tolerate.   There are many forms such as powders, tablets, wafers, gummies, etc Psyllium bran (Metamucil), methylcellulose (Citrucel), Miralax or Glycolax,  Benefiber, Flax Seed.  Adjust the dose week-by-week (1/2 dose/day to 6 doses a day) until you are moving your bowels 1-2 times a day.  Cut back the dose or try a different fiber product if it is giving you problems such as diarrhea or bloating. Sometimes a laxative is needed to help jump-start bowels if constipated until the fiber supplement can help regulate your bowels.  If you are tolerating eating & you are farting, it is okay to try a gentle laxative such as double dose MiraLax, prune juice, or Milk of Magnesia.  Avoid using laxatives too often. Stool softeners can sometimes help counteract the constipating effects of narcotic pain medicines.  It can also cause diarrhea, so avoid using for too long. If you are still constipated despite taking fiber daily, eating solids, and a few doses of laxatives, call our office. Controlling diarrhea Try drinking liquids and eating bland foods for a few days to avoid stressing your intestines further. Avoid dairy products (especially milk & ice cream) for a short time.  The intestines often can lose the ability to digest lactose when stressed. Avoid foods that cause gassiness or bloating.  Typical foods include beans and other legumes, cabbage, broccoli, and dairy foods.  Avoid greasy, spicy, fast foods.  Every person has some sensitivity to other foods, so listen to your body and avoid those foods that trigger problems for you. Probiotics (such as active yogurt, Align, etc) may help repopulate the intestines and colon with normal bacteria and calm down a sensitive digestive tract Adding a fiber supplement gradually can help thicken stools by absorbing excess fluid and retrain the intestines to act more normally.  Slowly increase the dose over a few weeks.  Too much fiber too soon can backfire and cause cramping & bloating. It is okay to try and slow down diarrhea with a few doses of antidiarrheal medicines.   Bismuth subsalicylate (ex. Kayopectate, Pepto Bismol)  for a few doses can help control diarrhea.  Avoid if pregnant.   Loperamide (Imodium) can slow down diarrhea.  Start with one tablet (2mg ) first.  Avoid if you are having fevers or severe pain.  ILEOSTOMY PATIENTS WILL HAVE CHRONIC DIARRHEA  since their colon is not in use.    Drink plenty of liquids.  You will need to drink even more glasses of water/liquid a day to avoid getting dehydrated. Record output from your ileostomy.  Expect to empty the bag every 3-4 hours at first.  Most people with a permanent ileostomy empty their bag 4-6 times at the least.   Use antidiarrheal medicine (especially Imodium) several times a day to avoid getting dehydrated.  Start with a dose at bedtime & breakfast.  Adjust up or down as needed.  Increase antidiarrheal medications as directed to avoid emptying the bag more than 8 times a day (every 3 hours). Work with your wound ostomy nurse to learn care for your ostomy.  See ostomy care instructions. TROUBLESHOOTING IRREGULAR BOWELS 1) Start with a soft & bland diet. No spicy, greasy, or fried foods.  2) Avoid gluten/wheat or dairy products from diet to see if symptoms improve. 3) Miralax 17gm or flax seed mixed in [place]. water or juice-daily. May use 2-4 times a day as needed. 4) Gas-X, Phazyme, etc. as needed for gas & bloating.  5) Prilosec (omeprazole) over-the-counter as needed 6)  Consider probiotics (Align, Activa, etc) to help calm the bowels down  Call your doctor if you are getting worse or not getting better.  Sometimes further testing (cultures, endoscopy, X-ray studies, CT scans, bloodwork, etc.) may be needed to help diagnose and treat the cause of the diarrhea. [facility], [place], [place], [place], [place]  [address] [phone] - Main.    [phone]  - Toll Free.   [phone] - Fax [email or web address]

## 2021-11-30 NOTE — Transfer of Care (Signed)
Immediate Anesthesia Transfer of Care Note  Patient: Brian Mccullough  Procedure(s) Performed: ROBOTIC LOW ANTERIOR RESECTION OF COLON, RECTOSIGMOID, ASSESSMENT OF PERFUSSION WITH FIREFLY, BILATERAL TAP BLOCK RIGID PROCTOSCOPY POSSIBLE CYSTOSCOPY with FIREFLY INJECTION  Patient Location: PACU  Anesthesia Type:General  Level of Consciousness: awake  Airway & Oxygen Therapy: Patient Spontanous Breathing and Patient connected to face mask oxygen  Post-op Assessment: Report given to RN and Post -op Vital signs reviewed and stable  Post vital signs: Reviewed and stable  Last Vitals:  Vitals Value Taken Time  BP 153/83 11/30/21 1134  Temp    Pulse 69 11/30/21 1138  Resp 21 11/30/21 1138  SpO2 100 % 11/30/21 1138  Vitals shown include unvalidated device data.  Last Pain:  Vitals:   11/30/21 0653  TempSrc:   PainSc: 0-No pain      Patients Stated Pain Goal: 3 (60/60/04 5997)  Complications: No notable events documented.

## 2021-11-30 NOTE — Op Note (Signed)
Preoperative diagnosis:  1. Colovesical fistula  Postoperative diagnosis: same  Procedure(s): 1. Cystoscopy with bilateral ureteral catheterization for firefly injection  Surgeon: Dr. Donald Pore  Anesthesia: general  Complications: none  EBL: <1 cc  Intraoperative findings: bilobar enlargement of prostate, I did not identify the fistula cystoscopically. Firefly injected into ureter bilaterally.  Indication: identification of ureters for abdominal/pelvic surgery  Description of procedure:   With appropriate consent having been obtained, the patient was brought to the operative suite. Anesthesia was induced and the patient was prepped and draped in the usual sterile fashion. A timeout was performed  Thereafter the rigid cystoscope was carefully inserted into the bladder. The prostate was notable for some bilobar enlargement. The bladder was inspected thoroughly and no abnormalities were identified.   The right ureteral orifice was identified and cannulated with a 6 fr open ended ureteral catheter. It was easily advanced to the level of the collecting system. The ureteral catheter was then slowly withdrawn while simultaneously injecting the firefly solution over the length of the ureter. Approximately 8 cc total were injected.  The procedure was then repeated in an identical fashion on the left.   A 16 fr foley was then placed in a sterile fashion with immediate return of clear blue/green urine, and the balloon was inflated with 10 cc sterile water.   Donald Pore MD 11/30/2021, 9:00 AM  [facility]  Pager: [phone]

## 2021-11-30 NOTE — Anesthesia Procedure Notes (Signed)
Procedure Name: Intubation Date/Time: 11/30/2021 8:32 AM Performed by: Cleda Daub, CRNA Pre-anesthesia Checklist: Patient identified, Emergency Drugs available, Suction available and Patient being monitored Patient Re-evaluated:Patient Re-evaluated prior to induction Oxygen Delivery Method: Circle system utilized Preoxygenation: Pre-oxygenation with 100% oxygen Induction Type: IV induction Ventilation: Mask ventilation without difficulty and Oral airway inserted - appropriate to patient size Laryngoscope Size: Mac and 4 Grade View: Grade I Tube type: Oral Tube size: 7.5 mm Number of attempts: 1 Airway Equipment and Method: Stylet and Oral airway Placement Confirmation: ETT inserted through vocal cords under direct vision, positive ETCO2 and breath sounds checked- equal and bilateral Secured at: 23 cm Tube secured with: Tape Dental Injury: Teeth and Oropharynx as per pre-operative assessment  Comments: SRNA intubated successfully.

## 2021-12-01 ENCOUNTER — Other Ambulatory Visit (HOSPITAL_COMMUNITY): Payer: Self-pay

## 2021-12-01 ENCOUNTER — Encounter (HOSPITAL_COMMUNITY): Payer: Self-pay | Admitting: Surgery

## 2021-12-01 ENCOUNTER — Telehealth: Payer: Self-pay | Admitting: *Deleted

## 2021-12-01 LAB — GLUCOSE, CAPILLARY
Glucose-Capillary: 99 mg/dL (ref 70–99)
Glucose-Capillary: 101 mg/dL — ABNORMAL HIGH (ref 70–99)
Glucose-Capillary: 124 mg/dL — ABNORMAL HIGH (ref 70–99)
Glucose-Capillary: 89 mg/dL (ref 70–99)

## 2021-12-01 LAB — BASIC METABOLIC PANEL
Anion gap: 4 — ABNORMAL LOW (ref 5–15)
BUN: 10 mg/dL (ref 8–23)
CO2: 23 mmol/L (ref 22–32)
Calcium: 8.5 mg/dL — ABNORMAL LOW (ref 8.9–10.3)
Chloride: 111 mmol/L (ref 98–111)
Creatinine, Ser: 0.89 mg/dL (ref 0.61–1.24)
GFR, Estimated: >60 mL/min (ref >60)
Glucose, Bld: 116 mg/dL — ABNORMAL HIGH (ref 70–99)
Potassium: 3.9 mmol/L (ref 3.5–5.1)
Sodium: 138 mmol/L (ref 135–145)

## 2021-12-01 LAB — CBC
HCT: 36.6 % — ABNORMAL LOW (ref 39.0–52.0)
Hemoglobin: 12.2 g/dL — ABNORMAL LOW (ref 13.0–17.0)
MCH: 29 pg (ref 26.0–34.0)
MCHC: 33.3 g/dL (ref 30.0–36.0)
MCV: 87.1 fL (ref 80.0–100.0)
Platelets: 173 10*3/uL (ref 150–400)
RBC: 4.2 MIL/uL — ABNORMAL LOW (ref 4.22–5.81)
RDW: 12.6 % (ref 11.5–15.5)
WBC: 12.2 10*3/uL — ABNORMAL HIGH (ref 4.0–10.5)
nRBC: 0 % (ref 0.0–0.2)

## 2021-12-01 LAB — MAGNESIUM: Magnesium: 1.8 mg/dL (ref 1.7–2.4)

## 2021-12-01 NOTE — Progress Notes (Signed)
Pharmacy Brief Note - Alvimopan (Entereg)  The standing order set for alvimopan (Entereg) now includes an automatic order to discontinue the drug after the patient has had a bowel movement. The change was approved by the [place] and the Medical Executive Committee.   This patient has had bowel movements documented by nursing. Therefore, alvimopan has been discontinued. If there are questions, please contact the pharmacy at [phone].   Thank you- Peggyann Juba, PharmD, BCPS 12/01/2021 10:13 AM

## 2021-12-01 NOTE — Telephone Encounter (Signed)
°  Follow up Call-  Call back number 11/29/2021  Post procedure Call Back phone  # [phone]  Permission to leave phone message Yes  Some recent data might be hidden     Patient questions:  Do you have a fever, pain , or abdominal swelling? No. Pain Score  0 *  Have you tolerated food without any problems? Yes.    Have you been able to return to your normal activities? Yes.    Do you have any questions about your discharge instructions: Diet   No. Medications  No. Follow up visit  No.  Do you have questions or concerns about your Care? No.  Actions: * If pain score is 4 or above: No action needed, pain <4.

## 2021-12-01 NOTE — Progress Notes (Signed)
Brian Mccullough [id number] 02-26-1951  CARE TEAM:  PCP: Cassandria Anger, MD  Outpatient Care Team: Patient Care Team: [name], Evie Lacks, MD as PCP - General (Internal Medicine) Sherren Mocha, MD as PCP - Cardiology (Cardiology) Marily Memos, MD (Orthopedic Surgery) Wonda Olds, MD as Consulting Physician (Cardiothoracic Surgery) Sharmon Revere as Physician Assistant (Cardiology) Szabat, Darnelle Maffucci, [facility] as Pharmacist (Pharmacist) Irine Seal, MD as Attending Physician (Urology) Ladene Artist, MD as Consulting Physician (Gastroenterology) Alyson Ingles Candee Furbish, MD as Consulting Physician (Urology) Michael Boston, MD as Consulting Physician (Colon and Rectal Surgery)  Inpatient Treatment Team: Treatment Team: Attending Provider: Michael Boston, MD; Registered Nurse: Keitha Butte, RN; Technician: Lewanda Rife, NT; Charge Nurse: Charlyne Petrin, RN; Utilization Review: Fortino Sic, RN   Problem List:   Principal Problem:   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 Active Problems:   Diabetes mellitus type 2, controlled ([place])   B12 deficiency   Vitamin D deficiency   Dyslipidemia   HTN (hypertension)   Insomnia   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  Consistent with diverticulitis.  Rather twisted upon itself.  No active abscess.  No active  hole/fistula to the bladder today.   No obvious metastatic disease on visceral parietal peritoneum or liver.   The anastomosis rests 12 cm from the anal verge by rigid proctoscopy.  Assessment  Recovering well so far  [facility] Stay = 1 days)  Plan:  -Advance diet per ERAS protocol -Foley catheter removed.  No evidence of any active fistula INO cath as needed -Medlock IV fluids with bolus backup -Multimodal pain control. -Hypertension.  Metoprolol with as needed breakthrough. -Insomnia-trazodone. -VTE prophylaxis- SCDs, etc -mobilize as tolerated to help recovery  Disposition:  Disposition:  The patient is from: Home  Anticipate discharge to:  Home  Anticipated Date of Discharge is:  February 24,2023    Barriers to discharge:  Pending Clinical improvement (more likely than not)  Patient currently is NOT MEDICALLY STABLE for discharge from the hospital from a surgery standpoint.      25 minutes spent in review, evaluation, examination, counseling, and coordination of care.   I have reviewed this patient's available data, including medical history, events of note, physical examination and test results as part of my evaluation.  A significant portion of that time was spent in counseling.  Care during the described time interval was provided by me.  12/01/2021    Subjective: (Chief complaint)  Patient denies much pain.  Already had bowel movement.  A few flecks of blood with it.  Pain controlled.  Denies any nausea  Objective:  Vital signs:  Vitals:   11/30/21 2054 12/01/21 0034 12/01/21 0455 12/01/21 0500  BP: (!) 151/77 (!) 149/81 135/69   Pulse: 73 (!) 58 (!) 55   Resp: 18 16 18    Temp: 98.9 F (37.2 C) 98.7 F (37.1 C) 98.6  F (37 C)   TempSrc: Oral Oral Oral   SpO2: 100% 100% 100%   Weight:    110.2 kg  Height:        Last BM Date : 11/29/21  Intake/Output   Yesterday:  02/22 0701 - 02/23 0700 In: 4547.5 [P.O.:1420; I.V.:2927.5; IV  Piggyback:200] Out: 2750 [Urine:2700; Blood:50] This shift:  No intake/output data recorded.  Bowel function:  Flatus: YES  BM:  YES  Drain: (No drain)   Physical Exam:  General: Pt awake/alert in no acute distress Eyes: PERRL, normal EOM.  Sclera clear.  No icterus Neuro: CN II-XII intact w/o focal sensory/motor deficits. Lymph: No head/neck/groin lymphadenopathy Psych:  No delerium/psychosis/paranoia.  Oriented x 4 HENT: Normocephalic, Mucus membranes moist.  No thrush Neck: Supple, No tracheal deviation.  No obvious thyromegaly Chest: No pain to chest wall compression.  Good respiratory excursion.  No audible wheezing CV:  Pulses intact.  Regular rhythm.  No major extremity edema MS: Normal AROM mjr joints.  No obvious deformity  Abdomen: Soft.  Nondistended.  Mildly tender at incisions only.  Dressings clean dry and intact.  No evidence of peritonitis.  No incarcerated hernias.  Ext:   No deformity.  No mjr edema.  No cyanosis Skin: No petechiae / purpurea.  No major sores.  Warm and dry    Results:   Cultures: Recent Results (from the past 720 hour(s))  SARS CORONAVIRUS 2 (TAT 6-24 HRS) Nasopharyngeal Nasopharyngeal Swab     Status: None   Collection Time: 11/28/21  7:07 AM   Specimen: Nasopharyngeal Swab  Result Value Ref Range Status   SARS Coronavirus 2 NEGATIVE NEGATIVE Final    Comment: (NOTE) SARS-CoV-2 target nucleic acids are NOT DETECTED.  The SARS-CoV-2 RNA is generally detectable in upper and lower respiratory specimens during the acute phase of infection. Negative results do not preclude SARS-CoV-2 infection, do not rule out co-infections with other pathogens, and should not be used as the sole basis for treatment or other patient management decisions. Negative results must be combined with clinical observations, patient history, and epidemiological information. The expected result is Negative.  Fact Sheet for  Patients: SugarRoll.be  Fact Sheet for Healthcare Providers: https://www.woods-mathews.com/  This test is not yet approved or cleared by the [place] FDA and  has been authorized for detection and/or diagnosis of SARS-CoV-2 by FDA under an Emergency Use Authorization (EUA). This EUA will remain  in effect (meaning this test can be used) for the duration of the COVID-19 declaration under Se ction 564(b)(1) of the Act, 21 U.S.C. section 360bbb-3(b)(1), unless the authorization is terminated or revoked sooner.  Performed at [facility], [place] [address] [place], [place] [address]     Labs: Results for orders placed or performed during the hospital encounter of 11/30/21 (from the past 48 hour(s))  Glucose, capillary     Status: None   Collection Time: 11/30/21  6:32 AM  Result Value Ref Range   Glucose-Capillary 91 70 - 99 mg/dL    Comment: Glucose reference range applies only to samples taken after fasting for at least 8 hours.   Comment 1 Notify RN    Comment 2 Document in Chart   Glucose, capillary     Status: Abnormal   Collection Time: 11/30/21 11:37 AM  Result Value Ref Range   Glucose-Capillary 137 (H) 70 - 99 mg/dL    Comment: Glucose reference range applies only to samples taken after fasting for at least 8 hours.  Comment 1 Notify RN   Glucose, capillary     Status: Abnormal   Collection Time: 11/30/21  4:49 PM  Result Value Ref Range   Glucose-Capillary 195 (H) 70 - 99 mg/dL    Comment: Glucose reference range applies only to samples taken after fasting for at least 8 hours.  Glucose, capillary     Status: Abnormal   Collection Time: 11/30/21  8:56 PM  Result Value Ref Range   Glucose-Capillary 147 (H) 70 - 99 mg/dL    Comment: Glucose reference range applies only to samples taken after fasting for at least 8 hours.  Basic metabolic panel     Status: Abnormal   Collection Time: 12/01/21  4:41 AM  Result Value Ref  Range   Sodium 138 135 - 145 mmol/L   Potassium 3.9 3.5 - 5.1 mmol/L   Chloride 111 98 - 111 mmol/L   CO2 23 22 - 32 mmol/L   Glucose, Bld 116 (H) 70 - 99 mg/dL    Comment: Glucose reference range applies only to samples taken after fasting for at least 8 hours.   BUN 10 8 - 23 mg/dL   Creatinine, Ser 0.89 0.61 - 1.24 mg/dL   Calcium 8.5 (L) 8.9 - 10.3 mg/dL   GFR, Estimated >60 >60 mL/min    Comment: (NOTE) Calculated using the CKD-EPI Creatinine Equation (2021)    Anion gap 4 (L) 5 - 15    Comment: Performed at [facility], [place] [address], [place], [place] [address]  CBC     Status: Abnormal   Collection Time: 12/01/21  4:41 AM  Result Value Ref Range   WBC 12.2 (H) 4.0 - 10.5 K/uL   RBC 4.20 (L) 4.22 - 5.81 MIL/uL   Hemoglobin 12.2 (L) 13.0 - 17.0 g/dL   HCT 36.6 (L) 39.0 - 52.0 %   MCV 87.1 80.0 - 100.0 fL   MCH 29.0 26.0 - 34.0 pg   MCHC 33.3 30.0 - 36.0 g/dL   RDW 12.6 11.5 - 15.5 %   Platelets 173 150 - 400 K/uL   nRBC 0.0 0.0 - 0.2 %    Comment: Performed at [facility], [place] [address] [place], [place] [address]  Magnesium     Status: None   Collection Time: 12/01/21  4:41 AM  Result Value Ref Range   Magnesium 1.8 1.7 - 2.4 mg/dL    Comment: Performed at [facility], [place] [address], [place], [place] [address]    Imaging / Studies: No results found.  Medications / Allergies: per chart  Antibiotics: Anti-infectives (From admission, onward)    Start     Dose/Rate Route Frequency Ordered Stop   11/30/21 2030  cefoTEtan (CEFOTAN) 2 g in sodium chloride 0.9 % 100 mL IVPB        2 g 200 mL/hr over 30 Minutes Intravenous Every 12 hours 11/30/21 1327 11/30/21 2144   11/30/21 1400  neomycin (MYCIFRADIN) tablet 1,000 mg  Status:  Discontinued       See Hyperspace for full Linked Orders Report.   1,000 mg Oral 3 times per day 11/30/21 0624 11/30/21 0625   11/30/21 1400  metroNIDAZOLE (FLAGYL) tablet 1,000 mg   Status:  Discontinued       See Hyperspace for full Linked Orders Report.   1,000 mg Oral 3 times per day 11/30/21 0624 11/30/21 0625   11/30/21 0630  cefoTEtan (CEFOTAN) 2 g in sodium chloride 0.9 % 100 mL  IVPB        2 g 200 mL/hr over 30 Minutes Intravenous On call to O.R. 11/30/21 6203 11/30/21 5597         Note: Portions of this report may have been transcribed using voice recognition software. Every effort was made to ensure accuracy; however, inadvertent computerized transcription errors may be present.   Any transcriptional errors that result from this process are unintentional.    [name], MD, FACS, MASCRS Esophageal, Gastrointestinal & Colorectal Surgery Robotic and Minimally Invasive Surgery  [facility], [place]  [place]. [address], [place], [place] [address] [phone] Fax [phone] Main  CONTACT INFORMATION:  Weekday (9AM-5PM): Call [facility] main office at [phone]  Weeknight (5PM-9AM) or Weekend/Holiday: Check [email or web address] (password " [email or web address]") for General Surgery [facility] coverage  (Please, do not use SecureChat as it is not reliable communication to operating surgeons for immediate patient care)      12/01/2021  7:19 AM

## 2021-12-02 ENCOUNTER — Other Ambulatory Visit (HOSPITAL_COMMUNITY): Payer: Self-pay

## 2021-12-02 LAB — SURGICAL PATHOLOGY

## 2021-12-02 LAB — GLUCOSE, CAPILLARY: Glucose-Capillary: 91 mg/dL (ref 70–99)

## 2021-12-02 NOTE — Progress Notes (Signed)
Reviewed written d/c instructions w pt and all questions answered. He verbalized understanding. D/C ambulatory w all belongings in stable condition 

## 2021-12-02 NOTE — Discharge Summary (Signed)
Physician Discharge Summary    Patient ID: Brian Mccullough MRN: [id number] DOB/AGE: 09-Dec-1950  71 y.o.  Patient Care Team: [name], Evie Lacks, MD as PCP - General (Internal Medicine) Sherren Mocha, MD as PCP - Cardiology (Cardiology) Marily Memos, MD (Orthopedic Surgery) Wonda Olds, MD as Consulting Physician (Cardiothoracic Surgery) Sharmon Revere as Physician Assistant (Cardiology) Szabat, Darnelle Maffucci, [facility] as Pharmacist (Pharmacist) Irine Seal, MD as Attending Physician (Urology) Ladene Artist, MD as Consulting Physician (Gastroenterology) Alyson Ingles Candee Furbish, MD as Consulting Physician (Urology) Michael Boston, MD as Consulting Physician (Colon and Rectal Surgery)  Admit date: 11/30/2021  Discharge date: 12/02/2021  Hospital Stay = 2 days    Discharge Diagnoses:  Principal Problem:   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 Active Problems:   Diabetes mellitus type 2, controlled ([place])   B12 deficiency   Vitamin D deficiency   Dyslipidemia   HTN (hypertension)   Insomnia   CAD s/p CABG x 6 in October 2021   Coronary artery disease   BPH (benign prostatic hyperplasia)   Diverticulitis of sigmoid colon   2 Days Post-Op  11/30/2021  POST-OPERATIVE DIAGNOSIS:   COLOVESICAL FISTULA SIGMOID DIVERTICULITIS   PROCEDURE:   ROBOTIC LOW ANTERIOR RECTOSIGMOID RESECTION ASSESSMENT OF PERFUSSION WITH FIREFLY BILATERAL TAP BLOCK RIGID PROCTOSCOPY   SURGEON:  [name], MD   OR FINDINGS:    Patient had inflamed diverticulitis at the descending/sigmoid junction stretched down to an densely adherent to the left posterior bladder and close to the left distal ureter.  Consistent with diverticulitis.  Rather twisted upon itself.  No active abscess.  No active hole/fistula to the bladder today.   No obvious metastatic disease on visceral parietal peritoneum or liver.   The anastomosis rests 12 cm from the anal  verge by rigid proctoscopy.  ###########################################  Preoperative diagnosis:  1. Colovesical fistula   Postoperative diagnosis: same   Procedure(s): 1. Cystoscopy with bilateral ureteral catheterization for firefly injection   Surgeon: Dr. Donald Pore     Consults: Urology  Hospital Course:   The patient underwent the surgery above.  Postoperatively, the patient gradually mobilized and advanced to a solid diet.  Pain and other symptoms were treated aggressively.    By the time of discharge, the patient was walking well the hallways, eating food, having flatus.  Pain was well-controlled on an oral medications.  Based on meeting discharge criteria and continuing to recover, I felt it was safe for the patient to be discharged from the hospital to further recover with close followup. Postoperative recommendations were discussed in detail.  They are written as well.  Discharged Condition: good  Discharge Exam: Blood pressure (!) 150/96, pulse (!) 57, temperature 98.2 F (36.8 C), temperature source Oral, resp. rate 18, height 6\' 3"  (1.905 m), weight 108.9 kg, SpO2 100 %.  General: Pt awake/alert/oriented x4 in No acute distress Eyes: PERRL, normal EOM.  Sclera clear.  No icterus Neuro: CN II-XII intact w/o focal sensory/motor deficits. Lymph: No head/neck/groin lymphadenopathy Psych:  No delerium/psychosis/paranoia HENT: Normocephalic, Mucus membranes moist.  No thrush Neck: Supple, No tracheal deviation Chest:  No chest wall pain w good excursion CV:  Pulses intact.  Regular rhythm MS: Normal AROM mjr joints.  No obvious deformity Abdomen: Soft.  Nondistended.  Nontender.  No evidence of peritonitis.  No incarcerated hernias. Ext:  SCDs BLE.  No mjr edema.  No cyanosis Skin: No petechiae / purpura   Disposition:  Follow-up Information     Michael Boston, MD Follow up in 3 week(s).   Specialties: General Surgery, Colon and Rectal Surgery Why: To  follow up after your hospital stay, To follow up after your operation Contact information: [place] [address] [phone]                 Discharge disposition: 01-Home or Self Care       Discharge Instructions     Call MD for:   Complete by: As directed    FEVER > 101.5 F  (temperatures < 101.5 F are not significant)   Call MD for:  extreme fatigue   Complete by: As directed    Call MD for:  persistant dizziness or light-headedness   Complete by: As directed    Call MD for:  persistant nausea and vomiting   Complete by: As directed    Call MD for:  redness, tenderness, or signs of infection (pain, swelling, redness, odor or green/yellow discharge around incision site)   Complete by: As directed    Call MD for:  severe uncontrolled pain   Complete by: As directed    Diet - low sodium heart healthy   Complete by: As directed    Start with a bland diet such as soups, liquids, starchy foods, low fat foods, etc. the first few days at home. Gradually advance to a solid, low-fat, high fiber diet by the end of the first week at home.   Add a fiber supplement to your diet (Metamucil, etc) If you feel full, bloated, or constipated, stay on a full liquid or pureed/blenderized diet for a few days until you feel better and are no longer constipated.   Discharge instructions   Complete by: As directed    See Discharge Instructions If you are not getting better after two weeks or are noticing you are getting worse, contact our office [phone] for further advice.  We may need to adjust your medications, re-evaluate you in the office, send you to the emergency room, or see what other things we can do to help. The clinic staff is available to answer your questions during regular business hours (8:30am-5pm).  Please don't hesitate to call and ask to speak to one of our nurses for clinical concerns.    A surgeon from [facility] is  always on call at the hospitals 24 hours/day If you have a medical emergency, go to the nearest emergency room or call 911.   Discharge wound care:   Complete by: As directed    It is good for closed incisions and even open wounds to be washed every day.  Shower every day.  Short baths are fine.  Wash the incisions and wounds clean with soap & water.    You may leave closed incisions open to air if it is dry.   You may cover the incision with clean gauze & replace it after your daily shower for comfort.  TEGADERM & STERISTRIPS:  You have clear gauze band-aid dressings over your closed incision(s).  You also have skin tapes called Steristrips on your incisions.  Leave these in place.  You may trim any edges that curl up with clean scissors.  You may remove all dressings & tapes in the shower in a week.   Driving Restrictions   Complete by: As directed    You may drive when: - you are no longer taking narcotic prescription pain medication -  you can comfortably wear a seatbelt - you can safely make sudden turns/stops without pain.   Increase activity slowly   Complete by: As directed    Start light daily activities --- self-care, walking, climbing stairs- beginning the day after surgery.  Gradually increase activities as tolerated.  Control your pain to be active.  Stop when you are tired.  Ideally, walk several times a day, eventually an hour a day.   Most people are back to most day-to-day activities in a few weeks.  It takes 4-6 weeks to get back to unrestricted, intense activity. If you can walk 30 minutes without difficulty, it is safe to try more intense activity such as jogging, treadmill, bicycling, low-impact aerobics, swimming, etc. Save the most intensive and strenuous activity for last (Usually 4-8 weeks after surgery) such as sit-ups, heavy lifting, contact sports, etc.  Refrain from any intense heavy lifting or straining until you are off narcotics for pain control.  You will have off  days, but things should improve week-by-week. DO NOT PUSH THROUGH PAIN.  Let pain be your guide: If it hurts to do something, don't do it.   Lifting restrictions   Complete by: As directed    If you can walk 30 minutes without difficulty, it is safe to try more intense activity such as jogging, treadmill, bicycling, low-impact aerobics, swimming, etc. Save the most intensive and strenuous activity for last (Usually 4-8 weeks after surgery) such as sit-ups, heavy lifting, contact sports, etc.   Refrain from any intense heavy lifting or straining until you are off narcotics for pain control.  You will have off days, but things should improve week-by-week. DO NOT PUSH THROUGH PAIN.  Let pain be your guide: If it hurts to do something, don't do it.  Pain is your body warning you to avoid that activity for another week until the pain goes down.   May shower / Bathe   Complete by: As directed    May walk up steps   Complete by: As directed    Remove dressing in 72 hours   Complete by: As directed    Make sure all dressings are removed by the third day after surgery.  Leave incisions open to air.  OK to cover incisions with gauze or bandages as desired   Sexual Activity Restrictions   Complete by: As directed    You may have sexual intercourse when it is comfortable. If it hurts to do something, stop.       Allergies as of 12/02/2021       Reactions   Codeine Nausea Only   Levaquin [levofloxacin]    Can take it but feels achy        Medication List     STOP taking these medications    (feeding supplement) PROSource Plus liquid   cefUROXime 500 MG tablet Commonly known as: CEFTIN       TAKE these medications    acetaminophen 500 MG tablet Commonly known as: TYLENOL Take 1,000 mg by mouth every 6 (six) hours as needed for headache or moderate pain.   aspirin 81 MG EC tablet Take 81 mg by mouth daily.   atorvastatin 40 MG tablet Commonly known as: LIPITOR Take 1 tablet  (40 mg total) by mouth daily. What changed: when to take this   benazepril 20 MG tablet Commonly known as: LOTENSIN TAKE 1 TABLET BY MOUTH  DAILY   cholecalciferol 25 MCG (1000 UNIT) tablet Commonly known as: VITAMIN D3  Take 1,000 Units by mouth daily.   cyanocobalamin 1000 MCG tablet Take 1,000 mcg by mouth daily.   furosemide 20 MG tablet Commonly known as: LASIX TAKE 1 TABLET BY MOUTH AS NEEDED FOR FLUID OR EDEMA   loratadine 10 MG tablet Commonly known as: CLARITIN Take 10 mg by mouth daily as needed for allergies or rhinitis.   meloxicam 15 MG tablet Commonly known as: MOBIC TAKE 1 TABLET BY MOUTH  DAILY AS NEEDED FOR PAIN   metFORMIN 500 MG tablet Commonly known as: GLUCOPHAGE TAKE 1 TABLET BY MOUTH  DAILY WITH BREAKFAST   metoprolol tartrate 25 MG tablet Commonly known as: LOPRESSOR Take 0.5 tablets (12.5 mg total) by mouth 2 (two) times daily.   multivitamin with minerals Tabs tablet Take 1 tablet by mouth daily.   tamsulosin 0.4 MG Caps capsule Commonly known as: FLOMAX Take 1 capsule (0.4 mg total) by mouth daily.   tiZANidine 4 MG tablet Commonly known as: Zanaflex Take 1 tablet (4 mg total) by mouth every 8 (eight) hours as needed for muscle spasms.   traMADol 50 MG tablet Commonly known as: ULTRAM Take 1-2 tablets (50-100 mg total) by mouth every 6 (six) hours as needed for moderate pain or severe pain.   traZODone 50 MG tablet Commonly known as: DESYREL TAKE 1/2 TO 1 TABLET BY  MOUTH AT BEDTIME AS NEEDED  FOR SLEEP   triamcinolone ointment 0.1 % Commonly known as: KENALOG Apply 1 application topically daily as needed (rash).               Discharge Care Instructions  (From admission, onward)           Start     Ordered   12/02/21 0000  Discharge wound care:       Comments: It is good for closed incisions and even open wounds to be washed every day.  Shower every day.  Short baths are fine.  Wash the incisions and wounds clean  with soap & water.    You may leave closed incisions open to air if it is dry.   You may cover the incision with clean gauze & replace it after your daily shower for comfort.  TEGADERM & STERISTRIPS:  You have clear gauze band-aid dressings over your closed incision(s).  You also have skin tapes called Steristrips on your incisions.  Leave these in place.  You may trim any edges that curl up with clean scissors.  You may remove all dressings & tapes in the shower in a week.   12/02/21 0722            Significant Diagnostic Studies:  Results for orders placed or performed during the hospital encounter of 11/30/21 (from the past 72 hour(s))  Glucose, capillary     Status: None   Collection Time: 11/30/21  6:32 AM  Result Value Ref Range   Glucose-Capillary 91 70 - 99 mg/dL    Comment: Glucose reference range applies only to samples taken after fasting for at least 8 hours.   Comment 1 Notify RN    Comment 2 Document in Chart   Glucose, capillary     Status: Abnormal   Collection Time: 11/30/21 11:37 AM  Result Value Ref Range   Glucose-Capillary 137 (H) 70 - 99 mg/dL    Comment: Glucose reference range applies only to samples taken after fasting for at least 8 hours.   Comment 1 Notify RN   Glucose, capillary     Status:  Abnormal   Collection Time: 11/30/21  4:49 PM  Result Value Ref Range   Glucose-Capillary 195 (H) 70 - 99 mg/dL    Comment: Glucose reference range applies only to samples taken after fasting for at least 8 hours.  Glucose, capillary     Status: Abnormal   Collection Time: 11/30/21  8:56 PM  Result Value Ref Range   Glucose-Capillary 147 (H) 70 - 99 mg/dL    Comment: Glucose reference range applies only to samples taken after fasting for at least 8 hours.  Basic metabolic panel     Status: Abnormal   Collection Time: 12/01/21  4:41 AM  Result Value Ref Range   Sodium 138 135 - 145 mmol/L   Potassium 3.9 3.5 - 5.1 mmol/L   Chloride 111 98 - 111 mmol/L   CO2  23 22 - 32 mmol/L   Glucose, Bld 116 (H) 70 - 99 mg/dL    Comment: Glucose reference range applies only to samples taken after fasting for at least 8 hours.   BUN 10 8 - 23 mg/dL   Creatinine, Ser 0.89 0.61 - 1.24 mg/dL   Calcium 8.5 (L) 8.9 - 10.3 mg/dL   GFR, Estimated >60 >60 mL/min    Comment: (NOTE) Calculated using the CKD-EPI Creatinine Equation (2021)    Anion gap 4 (L) 5 - 15    Comment: Performed at [facility], [place] [address], [place], [place] [address]  CBC     Status: Abnormal   Collection Time: 12/01/21  4:41 AM  Result Value Ref Range   WBC 12.2 (H) 4.0 - 10.5 K/uL   RBC 4.20 (L) 4.22 - 5.81 MIL/uL   Hemoglobin 12.2 (L) 13.0 - 17.0 g/dL   HCT 36.6 (L) 39.0 - 52.0 %   MCV 87.1 80.0 - 100.0 fL   MCH 29.0 26.0 - 34.0 pg   MCHC 33.3 30.0 - 36.0 g/dL   RDW 12.6 11.5 - 15.5 %   Platelets 173 150 - 400 K/uL   nRBC 0.0 0.0 - 0.2 %    Comment: Performed at [facility], [place] [address] [place], [place] [address]  Magnesium     Status: None   Collection Time: 12/01/21  4:41 AM  Result Value Ref Range   Magnesium 1.8 1.7 - 2.4 mg/dL    Comment: Performed at [facility], [place] [address], [place], [place] [address]  Glucose, capillary     Status: None   Collection Time: 12/01/21  7:29 AM  Result Value Ref Range   Glucose-Capillary 99 70 - 99 mg/dL    Comment: Glucose reference range applies only to samples taken after fasting for at least 8 hours.  Glucose, capillary     Status: Abnormal   Collection Time: 12/01/21 11:37 AM  Result Value Ref Range   Glucose-Capillary 101 (H) 70 - 99 mg/dL    Comment: Glucose reference range applies only to samples taken after fasting for at least 8 hours.  Glucose, capillary     Status: Abnormal   Collection Time: 12/01/21  3:49 PM  Result Value Ref Range   Glucose-Capillary 124 (H) 70 - 99 mg/dL    Comment: Glucose reference range applies only to samples taken after fasting for  at least 8 hours.  Glucose, capillary     Status: None   Collection Time: 12/01/21 10:11 PM  Result Value Ref Range   Glucose-Capillary 89 70 - 99 mg/dL    Comment: Glucose  reference range applies only to samples taken after fasting for at least 8 hours.    No results found.  Past Medical History:  Diagnosis Date   Allergy    CAD (coronary artery disease)    s/p Inf STEMI 10/21>>POBA to RCA >> s/p CABG (L-LAD, S-PDA, L Radial-OM1/RI, RIMA-RPLA) // post op AF; SVT >> Amiod Rx // Echo 10/21: EF 55-60, no RWMA, GLS -18.5, normal RVSF, mild LAE, trivial MR    Carotid artery disease ([place])    [place] 10/21: R 1-39; L 40-59   History of kidney stones    Hyperactive gag reflex    Hyperlipidemia    Hypertension    under control with meds., has been on med. x 30 yr.   Inguinal hernia 01/2014   bilateral   LBP (low back pain)    Non-insulin dependent type 2 diabetes mellitus (HCC)    OA (osteoarthritis) of knee 01/2004   left   Palpitations    s/p Inf STEMI 07/2020   POBA to RCA >> CABG   STEMI involving right coronary artery ([place]) 08/03/2020   Vitamin B 12 deficiency     Past Surgical History:  Procedure Laterality Date   CARDIAC CATHETERIZATION     CORONARY ARTERY BYPASS GRAFT N/A 08/04/2020   Procedure: CORONARY ARTERY BYPASS GRAFTING (CABG), ON PUMP, TIMES SIX, USING BILATERAL INTERNAL MAMMARY ARTERIES, RIGHT ENDOSCOPICALLY HARVESTED GREATER SAPHENOUS VEIN, AND RIGHT RADIAL ARTERY OPEN HARVEST;  Surgeon: Wonda Olds, MD;  Location: [facility];  Service: Open Heart Surgery;  Laterality: N/A;   CORONARY/GRAFT ACUTE MI REVASCULARIZATION N/A 08/03/2020   Procedure: Coronary/Graft Acute MI Revascularization;  Surgeon: Sherren Mocha, MD;  Location: [facility];  Service: Cardiovascular;  Laterality: N/A;   ENDOVEIN HARVEST OF GREATER SAPHENOUS VEIN Right 08/04/2020   Procedure: ENDOVEIN HARVEST OF GREATER SAPHENOUS VEIN;  Surgeon: Wonda Olds, MD;  Location: [place];  Service:  Open Heart Surgery;  Laterality: Right;   INGUINAL HERNIA REPAIR  age [age]   INGUINAL HERNIA REPAIR Bilateral 02/09/2014   Procedure: OPEN BILATERAL INGUINAL HERNIA REPAIR WITH MESH;  Surgeon: [name], MD;  Location: [place] [facility];  Service: General;  Laterality: Bilateral;   INSERTION OF MESH Bilateral 02/09/2014   Procedure: INSERTION OF MESH;  Surgeon: [name], MD;  Location: [facility];  Service: General;  Laterality: Bilateral;   KNEE ARTHROSCOPY Right    LEFT HEART CATH AND CORONARY ANGIOGRAPHY N/A 08/03/2020   Procedure: LEFT HEART CATH AND CORONARY ANGIOGRAPHY;  Surgeon: Sherren Mocha, MD;  Location: [place] [facility];  Service: Cardiovascular;  Laterality: N/A;   ORCHIECTOMY Left 05/31/2017   Procedure: LEFT RADICAL ORCHIECTOMY;  Surgeon: Irine Seal, MD;  Location: [facility];  Service: Urology;  Laterality: Left;   PROCTOSCOPY N/A 11/30/2021   Procedure: RIGID PROCTOSCOPY;  Surgeon: Michael Boston, MD;  Location: [facility];  Service: General;  Laterality: N/A;   RADIAL ARTERY HARVEST Right 08/04/2020   Procedure: RADIAL ARTERY HARVEST;  Surgeon: Wonda Olds, MD;  Location: [facility];  Service: Open Heart Surgery;  Laterality: Right;   TEE WITHOUT CARDIOVERSION N/A 08/04/2020   Procedure: TRANSESOPHAGEAL ECHOCARDIOGRAM (TEE);  Surgeon: Wonda Olds, MD;  Location: [place];  Service: Open Heart Surgery;  Laterality: N/A;   TONSILLECTOMY AND ADENOIDECTOMY  1960   TOTAL KNEE ARTHROPLASTY Left 01/14/2004    Social History   Socioeconomic History   Marital status: Married    Spouse name:  Not on file   Number of children: Not on file   Years of education: 12   Highest education level: Not on file  Occupational History   Occupation: diabled    Employer: UNEMPLOYED    Comment: due to knees  Tobacco Use   Smoking status: Former    Types: Cigars    Quit date: 08/03/2020    Years since quitting: 1.3   Smokeless tobacco:  Never   Tobacco comments:    2-3 small cigars/day  Vaping Use   Vaping Use: Never used  Substance and Sexual Activity   Alcohol use: No   Drug use: No   Sexual activity: Yes  Other Topics Concern   Not on file  Social History Narrative   Not on file   Social Determinants of Health   Financial Resource Strain: Low Risk    Difficulty of Paying Living Expenses: Not hard at all  Food Insecurity: No Food Insecurity   Worried About Charity fundraiser in the Last Year: Never true   [place] in the Last Year: Never true  Transportation Needs: No Transportation Needs   Lack of Transportation (Medical): No   Lack of Transportation (Non-Medical): No  Physical Activity: Sufficiently Active   Days of Exercise per Week: 5 days   Minutes of Exercise per Session: 30 min  Stress: No Stress Concern Present   Feeling of Stress : Not at all  Social Connections: Moderately Isolated   Frequency of Communication with Friends and Family: More than three times a week   Frequency of Social Gatherings with Friends and Family: More than three times a week   Attends Religious Services: Never   Marine scientist or Organizations: No   Attends Music therapist: Never   Marital Status: Married  Human resources officer Violence: Not At Risk   Fear of Current or Ex-Partner: No   Emotionally Abused: No   Physically Abused: No   Sexually Abused: No    Family History  Problem Relation Age of Onset   Hypertension Mother    Diabetes Mother    Diabetes Father    Colon cancer Neg Hx    Esophageal cancer Neg Hx    Rectal cancer Neg Hx    Stomach cancer Neg Hx    Colon polyps Neg Hx     Current Facility-Administered Medications  Medication Dose Route Frequency Provider Last Rate Last Admin   acetaminophen (TYLENOL) tablet 1,000 mg  1,000 mg Oral Q6H Michael Boston, MD   1,000 mg at 12/02/21 0505   alum & mag hydroxide-simeth (MAALOX/MYLANTA) 200-200-20 MG/5ML suspension 30 mL  30 mL  Oral Q6H PRN Michael Boston, MD       aspirin EC tablet 81 mg  81 mg Oral Daily Michael Boston, MD   81 mg at 12/01/21 8828   atorvastatin (LIPITOR) tablet 40 mg  40 mg Oral Carlis Abbott, MD       cholecalciferol (VITAMIN D3) tablet 1,000 Units  1,000 Units Oral Daily Michael Boston, MD   1,000 Units at 12/01/21 0924   diphenhydrAMINE (BENADRYL) 12.5 MG/5ML elixir 12.5 mg  12.5 mg Oral Q6H PRN Michael Boston, MD       Or   diphenhydrAMINE (BENADRYL) injection 12.5 mg  12.5 mg Intravenous Q6H PRN Michael Boston, MD       enalaprilat (VASOTEC) injection 0.625-1.25 mg  0.625-1.25 mg Intravenous Q6H PRN Michael Boston, MD       enoxaparin (LOVENOX)  injection 40 mg  40 mg Subcutaneous Q24H Michael Boston, MD   40 mg at 12/01/21 0805   feeding supplement (ENSURE SURGERY) liquid 237 mL  237 mL Oral BID BM Michael Boston, MD   237 mL at 12/01/21 1354   hydrALAZINE (APRESOLINE) injection 10 mg  10 mg Intravenous Q2H PRN Michael Boston, MD       HYDROmorphone (DILAUDID) injection 0.5-2 mg  0.5-2 mg Intravenous Q4H PRN Michael Boston, MD       insulin aspart (novoLOG) injection 0-15 Units  0-15 Units Subcutaneous TID WC Michael Boston, MD   2 Units at 12/01/21 1627   insulin aspart (novoLOG) injection 0-5 Units  0-5 Units Subcutaneous QHS Michael Boston, MD       lactated ringers bolus 1,000 mL  1,000 mL Intravenous Q8H PRN Michael Boston, MD       lip balm (CARMEX) ointment 1 application  1 application Topical BID Michael Boston, MD   1 application at 92/42/68 0925   loratadine (CLARITIN) tablet 10 mg  10 mg Oral Daily PRN Michael Boston, MD       magic mouthwash  15 mL Oral QID PRN Michael Boston, MD       melatonin tablet 3 mg  3 mg Oral QHS PRN Michael Boston, MD       metoprolol tartrate (LOPRESSOR) injection 5 mg  5 mg Intravenous Q6H PRN Michael Boston, MD       metoprolol tartrate (LOPRESSOR) tablet 12.5 mg  12.5 mg Oral BID Michael Boston, MD   12.5 mg at 12/01/21 2142   multivitamin with minerals tablet 1  tablet  1 tablet Oral Daily Michael Boston, MD   1 tablet at 12/01/21 0924   ondansetron (ZOFRAN) tablet 4 mg  4 mg Oral Q6H PRN Michael Boston, MD       Or   ondansetron [facility]) injection 4 mg  4 mg Intravenous Q6H PRN Michael Boston, MD       polycarbophil (FIBERCON) tablet 625 mg  625 mg Oral BID Michael Boston, MD   625 mg at 12/01/21 2142   prochlorperazine (COMPAZINE) tablet 10 mg  10 mg Oral Q6H PRN Michael Boston, MD       Or   prochlorperazine (COMPAZINE) injection 5-10 mg  5-10 mg Intravenous Q6H PRN Michael Boston, MD       simethicone (MYLICON) 40 TM/1.9QQ suspension 80 mg  80 mg Oral QID PRN Michael Boston, MD       tiZANidine (ZANAFLEX) tablet 4 mg  4 mg Oral Q8H PRN Michael Boston, MD       traMADol Veatrice Bourbon) tablet 50-100 mg  50-100 mg Oral Q6H PRN Michael Boston, MD   100 mg at 12/01/21 1108   traZODone (DESYREL) tablet 25-50 mg  25-50 mg Oral QHS PRN Michael Boston, MD       triamcinolone ointment (KENALOG) 0.1 % 1 application  1 application Topical Daily PRN Michael Boston, MD       vitamin B-12 (CYANOCOBALAMIN) tablet 1,000 mcg  1,000 mcg Oral Daily Michael Boston, MD   1,000 mcg at 12/01/21 2297     Allergies  Allergen Reactions   Codeine Nausea Only   Levaquin [Levofloxacin]     Can take it but feels achy    Signed: Morton Peters, MD, FACS, MASCRS Esophageal, Gastrointestinal & Colorectal Surgery Robotic and Minimally Invasive Surgery  [facility], [place]  [place]. [address]  [place], [place] [address] [phone] Fax [phone] Main  CONTACT INFORMATION:  Weekday (9AM-5PM): Call [facility] main office at [phone]  Weeknight (5PM-9AM) or Weekend/Holiday: Check [email or web address] (password " [email or web address]") for General Surgery [facility] coverage  (Please, do not use SecureChat as it is not reliable communication to operating surgeons for immediate patient care)      12/02/2021, 7:23 AM

## 2021-12-07 ENCOUNTER — Encounter: Payer: Self-pay | Admitting: Gastroenterology

## 2021-12-19 ENCOUNTER — Ambulatory Visit: Payer: Self-pay | Admitting: Surgery

## 2021-12-19 DIAGNOSIS — Z86010 Personal history of colonic polyps: Secondary | ICD-10-CM | POA: Insufficient documentation

## 2021-12-19 DIAGNOSIS — Z872 Personal history of diseases of the skin and subcutaneous tissue: Secondary | ICD-10-CM | POA: Insufficient documentation

## 2021-12-19 DIAGNOSIS — Z87440 Personal history of urinary (tract) infections: Secondary | ICD-10-CM | POA: Insufficient documentation

## 2021-12-21 ENCOUNTER — Other Ambulatory Visit: Payer: Self-pay

## 2021-12-21 ENCOUNTER — Ambulatory Visit (INDEPENDENT_AMBULATORY_CARE_PROVIDER_SITE_OTHER): Payer: Medicare Other | Admitting: Internal Medicine

## 2021-12-21 ENCOUNTER — Encounter: Payer: Self-pay | Admitting: Internal Medicine

## 2021-12-21 VITALS — BP 112/68 | HR 61 | Temp 98.6°F | Ht 75.0 in | Wt 240.0 lb

## 2021-12-21 DIAGNOSIS — E559 Vitamin D deficiency, unspecified: Secondary | ICD-10-CM | POA: Diagnosis not present

## 2021-12-21 DIAGNOSIS — N321 Vesicointestinal fistula: Secondary | ICD-10-CM

## 2021-12-21 DIAGNOSIS — E1165 Type 2 diabetes mellitus with hyperglycemia: Secondary | ICD-10-CM

## 2021-12-21 DIAGNOSIS — I25110 Atherosclerotic heart disease of native coronary artery with unstable angina pectoris: Secondary | ICD-10-CM | POA: Diagnosis not present

## 2021-12-21 DIAGNOSIS — E538 Deficiency of other specified B group vitamins: Secondary | ICD-10-CM

## 2021-12-21 DIAGNOSIS — F5101 Primary insomnia: Secondary | ICD-10-CM

## 2021-12-21 MED ORDER — TRIAMCINOLONE ACETONIDE 0.1 % EX OINT: 1.0000 "application " | TOPICAL_OINTMENT | Freq: Two times a day (BID) | CUTANEOUS | 3 refills | Status: DC | PRN

## 2021-12-21 MED ORDER — ATORVASTATIN CALCIUM 40 MG PO TABS: 40.0000 mg | ORAL_TABLET | Freq: Every day | ORAL | 3 refills | Status: DC

## 2021-12-21 MED ORDER — BENAZEPRIL HCL 20 MG PO TABS: 20.0000 mg | ORAL_TABLET | Freq: Every day | ORAL | 3 refills | Status: DC

## 2021-12-21 MED ORDER — MELOXICAM 15 MG PO TABS: 15.0000 mg | ORAL_TABLET | Freq: Every day | ORAL | 0 refills | Status: DC | PRN

## 2021-12-21 MED ORDER — TRAZODONE HCL 50 MG PO TABS: ORAL_TABLET | ORAL | 3 refills | Status: DC

## 2021-12-21 MED ORDER — FUROSEMIDE 20 MG PO TABS: ORAL_TABLET | ORAL | 1 refills | Status: DC

## 2021-12-21 MED ORDER — METOPROLOL TARTRATE 25 MG PO TABS: 12.5000 mg | ORAL_TABLET | Freq: Two times a day (BID) | ORAL | 3 refills | Status: DC

## 2021-12-21 MED ORDER — METFORMIN HCL 500 MG PO TABS: 500.0000 mg | ORAL_TABLET | Freq: Every day | ORAL | 3 refills | Status: DC

## 2021-12-21 MED ORDER — TRAMADOL HCL 50 MG PO TABS: 50.0000 mg | ORAL_TABLET | Freq: Four times a day (QID) | ORAL | 2 refills | Status: DC | PRN

## 2021-12-21 MED ORDER — TIZANIDINE HCL 4 MG PO TABS
4.0000 mg | ORAL_TABLET | Freq: Three times a day (TID) | ORAL | 0 refills | Status: DC | PRN
Start: 2021-12-21 — End: 2024-06-03

## 2021-12-21 NOTE — Assessment & Plan Note (Signed)
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

## 2021-12-21 NOTE — Progress Notes (Signed)
? ?Subjective:  ?Patient ID: Brian Mccullough, male    DOB: 1951/07/09  Age: 71 y.o. MRN: [id number] ? ?CC: No chief complaint on file. ? ? 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 ?  ? ? ?Outpatient Medications Prior to Visit  ?Medication Sig Dispense Refill  ? acetaminophen (TYLENOL) 500 MG tablet Take 1,000 mg by mouth every 6 (six) hours as needed for headache or moderate pain.    ? aspirin 81 MG EC tablet Take 81 mg by mouth daily.    ? cholecalciferol (VITAMIN D3) 25 MCG (1000 UNIT) tablet Take 1,000 Units by mouth daily.    ? cyanocobalamin 1000 MCG tablet Take 1,000 mcg by mouth daily.    ? loratadine (CLARITIN) 10 MG tablet Take 10 mg by mouth daily as needed for allergies or rhinitis.    ? Multiple Vitamin (MULTIVITAMIN WITH MINERALS) TABS tablet Take 1 tablet by mouth daily. 30 tablet 0  ? atorvastatin (LIPITOR) 40 MG tablet Take 1 tablet (40 mg total) by mouth daily. (Patient taking differently: Take 40 mg by mouth every other day.) 90 tablet 3  ? benazepril (LOTENSIN) 20 MG tablet TAKE 1 TABLET BY MOUTH  DAILY 90 tablet 3  ? furosemide (LASIX) 20 MG tablet TAKE 1 TABLET BY MOUTH AS NEEDED FOR FLUID OR EDEMA 90 tablet 2  ? meloxicam (MOBIC) 15 MG tablet TAKE 1 TABLET BY MOUTH  DAILY AS NEEDED FOR PAIN 90 tablet 3  ? metFORMIN (GLUCOPHAGE) 500 MG tablet TAKE 1 TABLET BY MOUTH  DAILY WITH BREAKFAST 90 tablet 3  ? metoprolol tartrate (LOPRESSOR) 25 MG tablet Take 0.5 tablets (12.5 mg total) by mouth 2 (two) times daily. 15 tablet 0  ? traMADol (ULTRAM) 50 MG tablet Take 1-2 tablets (50-100 mg total) by mouth every 6 (six) hours as needed for moderate pain or severe pain. 20 tablet 0  ? triamcinolone ointment (KENALOG) 0.1 % Apply 1 application topically daily as needed (rash).    ? tamsulosin (FLOMAX) 0.4 MG CAPS capsule Take 1 capsule (0.4 mg total) by mouth daily. (Patient not taking: Reported on 11/15/2021) 90 capsule 3  ?  tiZANidine (ZANAFLEX) 4 MG tablet Take 1 tablet (4 mg total) by mouth every 8 (eight) hours as needed for muscle spasms. (Patient not taking: Reported on 11/22/2021) 90 tablet 0  ? traZODone (DESYREL) 50 MG tablet TAKE 1/2 TO 1 TABLET BY  MOUTH AT BEDTIME AS NEEDED  FOR SLEEP (Patient not taking: Reported on 11/22/2021) 90 tablet 3  ? ?No facility-administered medications prior to visit.  ? ? ?ROS: ?Review of Systems  ?Constitutional:  Negative for appetite change, fatigue and unexpected weight change.  ?HENT:  Negative for congestion, nosebleeds, sneezing, sore throat and trouble swallowing.   ?Eyes:  Negative for itching and visual disturbance.  ?Respiratory:  Negative for cough.   ?Cardiovascular:  Negative for chest pain, palpitations and leg swelling.  ?Gastrointestinal:  Negative for abdominal distention, blood in stool, diarrhea and nausea.  ?Genitourinary:  Negative for frequency and hematuria.  ?Musculoskeletal:  Positive for arthralgias and back pain. Negative for gait problem, joint swelling and neck pain.  ?Skin:  Negative for rash.  ?Neurological:  Negative for dizziness, tremors, speech difficulty and weakness.  ?Psychiatric/Behavioral:  Negative for agitation, dysphoric mood and sleep disturbance. The patient is not nervous/anxious.   ? ?Objective:  ?BP 112/68 (BP Location: Left Arm, Patient Position: Sitting, Cuff Size: Large)  Pulse 61   Temp 98.6 ?F (37 ?C) (Oral)   Ht '6\' 3"'$  (1.905 m)   Wt 240 lb (108.9 kg)   SpO2 98%   BMI 30.00 kg/m?  ? ?BP Readings from Last 3 Encounters:  ?12/21/21 112/68  ?12/02/21 (!) 150/96  ?11/29/21 138/64  ? ? ?Wt Readings from Last 3 Encounters:  ?12/21/21 240 lb (108.9 kg)  ?12/02/21 240 lb 1.3 oz (108.9 kg)  ?11/29/21 239 lb (108.4 kg)  ? ? ?Physical Exam ?Constitutional:   ?   General: He is not in acute distress. ?   Appearance: Normal appearance. He is well-developed.  ?   Comments: NAD  ?Eyes:  ?   Conjunctiva/sclera: Conjunctivae normal.  ?   Pupils: Pupils  are equal, round, and reactive to light.  ?Neck:  ?   Thyroid: No thyromegaly.  ?   Vascular: No JVD.  ?Cardiovascular:  ?   Rate and Rhythm: Normal rate and regular rhythm.  ?   Heart sounds: Normal heart sounds. No murmur heard. ?  No friction rub. No gallop.  ?Pulmonary:  ?   Effort: Pulmonary effort is normal. No respiratory distress.  ?   Breath sounds: Normal breath sounds. No wheezing or rales.  ?Chest:  ?   Chest wall: No tenderness.  ?Abdominal:  ?   General: Bowel sounds are normal. There is no distension.  ?   Palpations: Abdomen is soft. There is no mass.  ?   Tenderness: There is no abdominal tenderness. There is no guarding or rebound.  ?Musculoskeletal:     ?   General: No tenderness. Normal range of motion.  ?   Cervical back: Normal range of motion.  ?Lymphadenopathy:  ?   Cervical: No cervical adenopathy.  ?Skin: ?   General: Skin is warm and dry.  ?   Findings: No rash.  ?Neurological:  ?   Mental Status: He is alert and oriented to person, place, and time.  ?   Cranial Nerves: No cranial nerve deficit.  ?   Motor: No abnormal muscle tone.  ?   Coordination: Coordination normal.  ?   Gait: Gait normal.  ?   Deep Tendon Reflexes: Reflexes are normal and symmetric.  ?Psychiatric:     ?   Behavior: Behavior normal.     ?   Thought Content: Thought content normal.     ?   Judgment: Judgment normal.  ? ? ?Lab Results  ?Component Value Date  ? WBC 12.2 (H) 12/01/2021  ? HGB 12.2 (L) 12/01/2021  ? HCT 36.6 (L) 12/01/2021  ? PLT 173 12/01/2021  ? GLUCOSE 116 (H) 12/01/2021  ? CHOL 131 06/20/2021  ? TRIG 135 06/20/2021  ? HDL 38 (L) 06/20/2021  ? LDLDIRECT 88.1 01/23/2011  ? [place] 69 06/20/2021  ? ALT 8 09/28/2021  ? AST 15 09/28/2021  ? NA 138 12/01/2021  ? K 3.9 12/01/2021  ? CL 111 12/01/2021  ? CREATININE 0.89 12/01/2021  ? BUN 10 12/01/2021  ? CO2 23 12/01/2021  ? TSH 0.673 08/04/2020  ? PSA 0.26 07/30/2020  ? INR 1.5 (H) 08/04/2020  ? HGBA1C 5.2 11/21/2021  ? MICROALBUR 2.0 (H) 03/29/2021  ? ? ?No  results found. ? ?Assessment & Plan:  ? ?Problem List Items Addressed This Visit   ? ? B12 deficiency  ?  Cont on Vit B12 ?  ?  ? 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  Colovesical fistula  s/p robotic LAR resection & takedown of fistula 11/30/2021 ?Doing well ?  ?  ? Relevant Orders  ? Urinalysis  ? Coronary artery disease  ?  Cont on Lipitor - 40 mg/d ?  ?  ? Relevant Medications  ? atorvastatin (LIPITOR) 40 MG tablet  ? benazepril (LOTENSIN) 20 MG tablet  ? furosemide (LASIX) 20 MG tablet  ? metoprolol tartrate (LOPRESSOR) 25 MG tablet  ? Diabetes mellitus type 2, controlled ([place]) - Primary  ? Relevant Medications  ? atorvastatin (LIPITOR) 40 MG tablet  ? benazepril (LOTENSIN) 20 MG tablet  ? metFORMIN (GLUCOPHAGE) 500 MG tablet  ? Other Relevant Orders  ? Comprehensive metabolic panel  ? Hemoglobin A1c  ? Insomnia  ?  Trazodone at hs ?  ?  ? Vitamin D deficiency  ?    ? ?  ?  ?  ? ? ?Meds ordered this encounter  ?Medications  ? traMADol (ULTRAM) 50 MG tablet  ?  Sig: Take 1-2 tablets (50-100 mg total) by mouth every 6 (six) hours as needed for moderate pain or severe pain.  ?  Dispense:  120 tablet  ?  Refill:  2  ? atorvastatin (LIPITOR) 40 MG tablet  ?  Sig: Take 1 tablet (40 mg total) by mouth daily.  ?  Dispense:  90 tablet  ?  Refill:  3  ? benazepril (LOTENSIN) 20 MG tablet  ?  Sig: Take 1 tablet (20 mg total) by mouth daily.  ?  Dispense:  90 tablet  ?  Refill:  3  ?  Requesting 1 year supply  ? furosemide (LASIX) 20 MG tablet  ?  Sig: TAKE 1 TABLET BY MOUTH AS NEEDED FOR FLUID OR EDEMA  ?  Dispense:  90 tablet  ?  Refill:  1  ? meloxicam (MOBIC) 15 MG tablet  ?  Sig: Take 1 tablet (15 mg total) by mouth daily as needed. for pain  ?  Dispense:  90 tablet  ?  Refill:  0  ?  Requesting 1 year supply  ? metFORMIN (GLUCOPHAGE) 500 MG tablet  ?  Sig: Take 1 tablet (500 mg total) by mouth daily with breakfast.  ?  Dispense:  90 tablet  ?  Refill:  3  ?  Requesting 1 year  supply  ? metoprolol tartrate (LOPRESSOR) 25 MG tablet  ?  Sig: Take 0.5 tablets (12.5 mg total) by mouth 2 (two) times daily.  ?  Dispense:  90 tablet  ?  Refill:  3  ? traZODone (DESYREL) 50 MG tablet  ?  S

## 2021-12-21 NOTE — Assessment & Plan Note (Signed)
Cont on Vit B12 

## 2021-12-21 NOTE — Assessment & Plan Note (Signed)
Trazodone at hs 

## 2021-12-21 NOTE — Assessment & Plan Note (Signed)
Cont on Lipitor 40 mg/d 

## 2021-12-22 IMAGING — DX DG CHEST 1V PORT
1 series · 1 of 1 positions shown · non-contrast
Comparison: 05/31/2007

CLINICAL DATA: Chest pain

EXAM:
PORTABLE CHEST 1 VIEW

[chest ap]
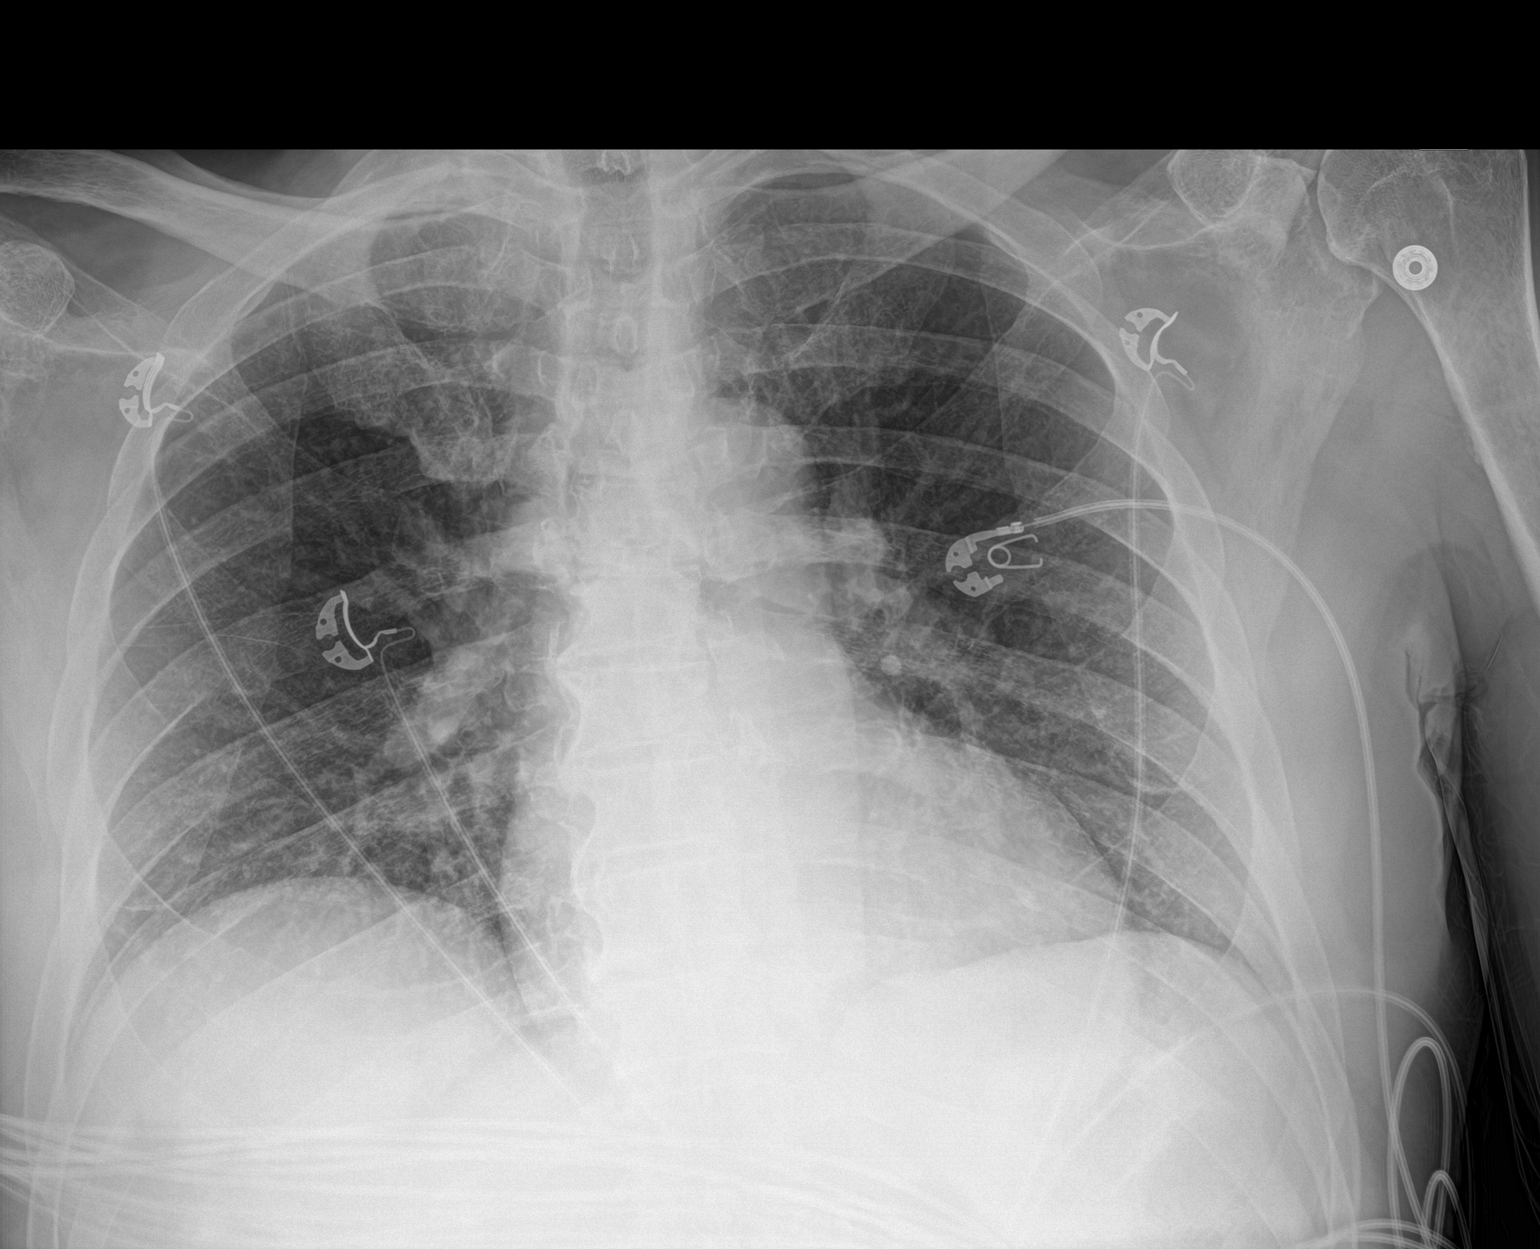

[1 of 1 positions shown; findings below may reference images not displayed]

FINDINGS: The heart size and mediastinal contours are within normal limits.
Both lungs are clear. The visualized skeletal structures are
unremarkable.
IMPRESSION: No active disease.

## 2021-12-23 IMAGING — DX DG CHEST 1V PORT
1 series · 1 of 1 positions shown · non-contrast
Comparison: August 04, 2020.

CLINICAL DATA: Status post coronary bypass graft.

EXAM:
PORTABLE CHEST 1 VIEW

[chest ap]
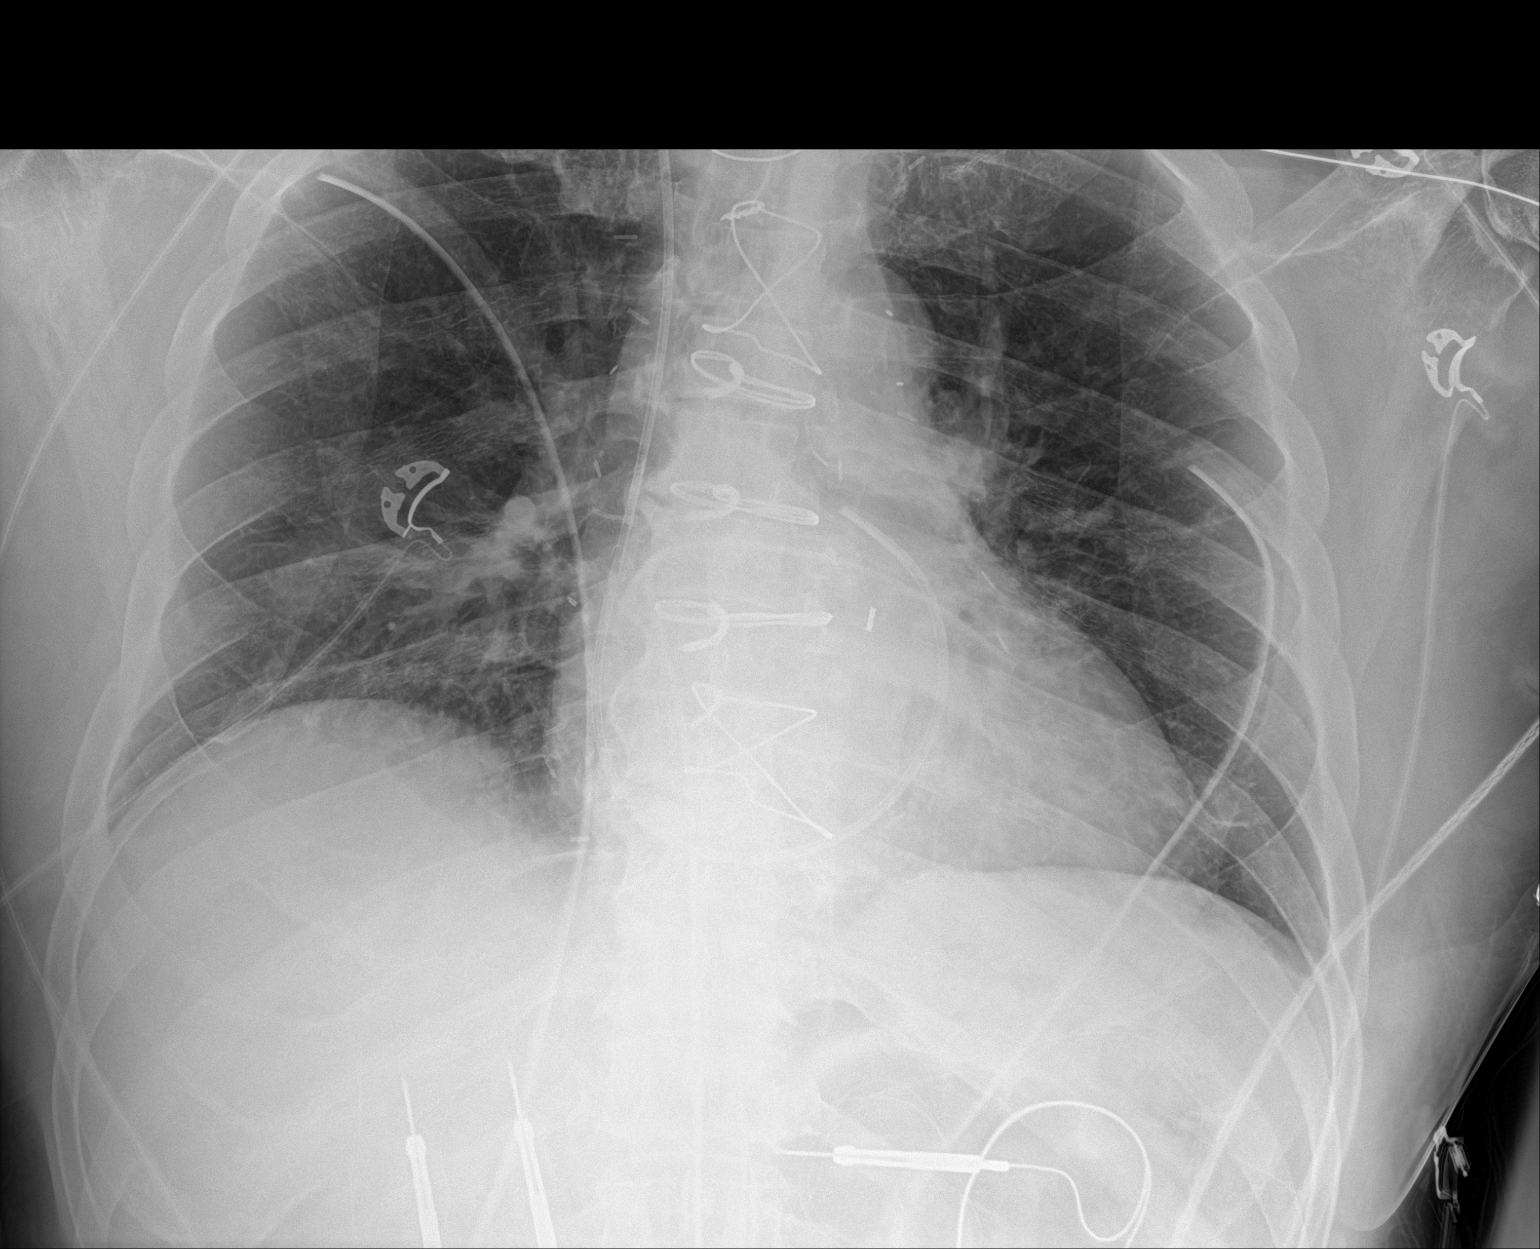

[1 of 1 positions shown; findings below may reference images not displayed]

FINDINGS: Stable cardiomediastinal silhouette. Bilateral chest tubes are noted
without pneumothorax. Endotracheal tube is in good position. Right
internal jugular Swan-Ganz catheter is noted with distal tip in
expected position of main pulmonary artery. No pneumothorax or
pleural effusion is noted. Lungs are clear. Bony thorax is
unremarkable.
IMPRESSION: Endotracheal tube in good position. Bilateral chest tubes are noted
without pneumothorax. No acute cardiopulmonary abnormality seen.

## 2021-12-26 ENCOUNTER — Telehealth: Payer: Self-pay | Admitting: Internal Medicine

## 2021-12-26 NOTE — Telephone Encounter (Addendum)
Saw patient and did PAD screening and right leg was abnormal - they will send you the report - while doing his exam she noticed a small bony growth on the neck area - Patient has noticed that this has gotten better but patient told nurse that he forgot to address this issue at last appointment. ?

## 2021-12-27 NOTE — Telephone Encounter (Signed)
Noted.  Will likely to address both at the next visit.  I will wait to see the Doppler report.  Thank you ?

## 2022-01-04 IMAGING — DX DG CHEST 2V
2 series · 2 of 2 positions shown · non-contrast
Comparison: August 07, 2020

CLINICAL DATA: Status post coronary artery bypass grafting

EXAM:
CHEST - 2 VIEW

[dg chest 2 view (1 of 2)]
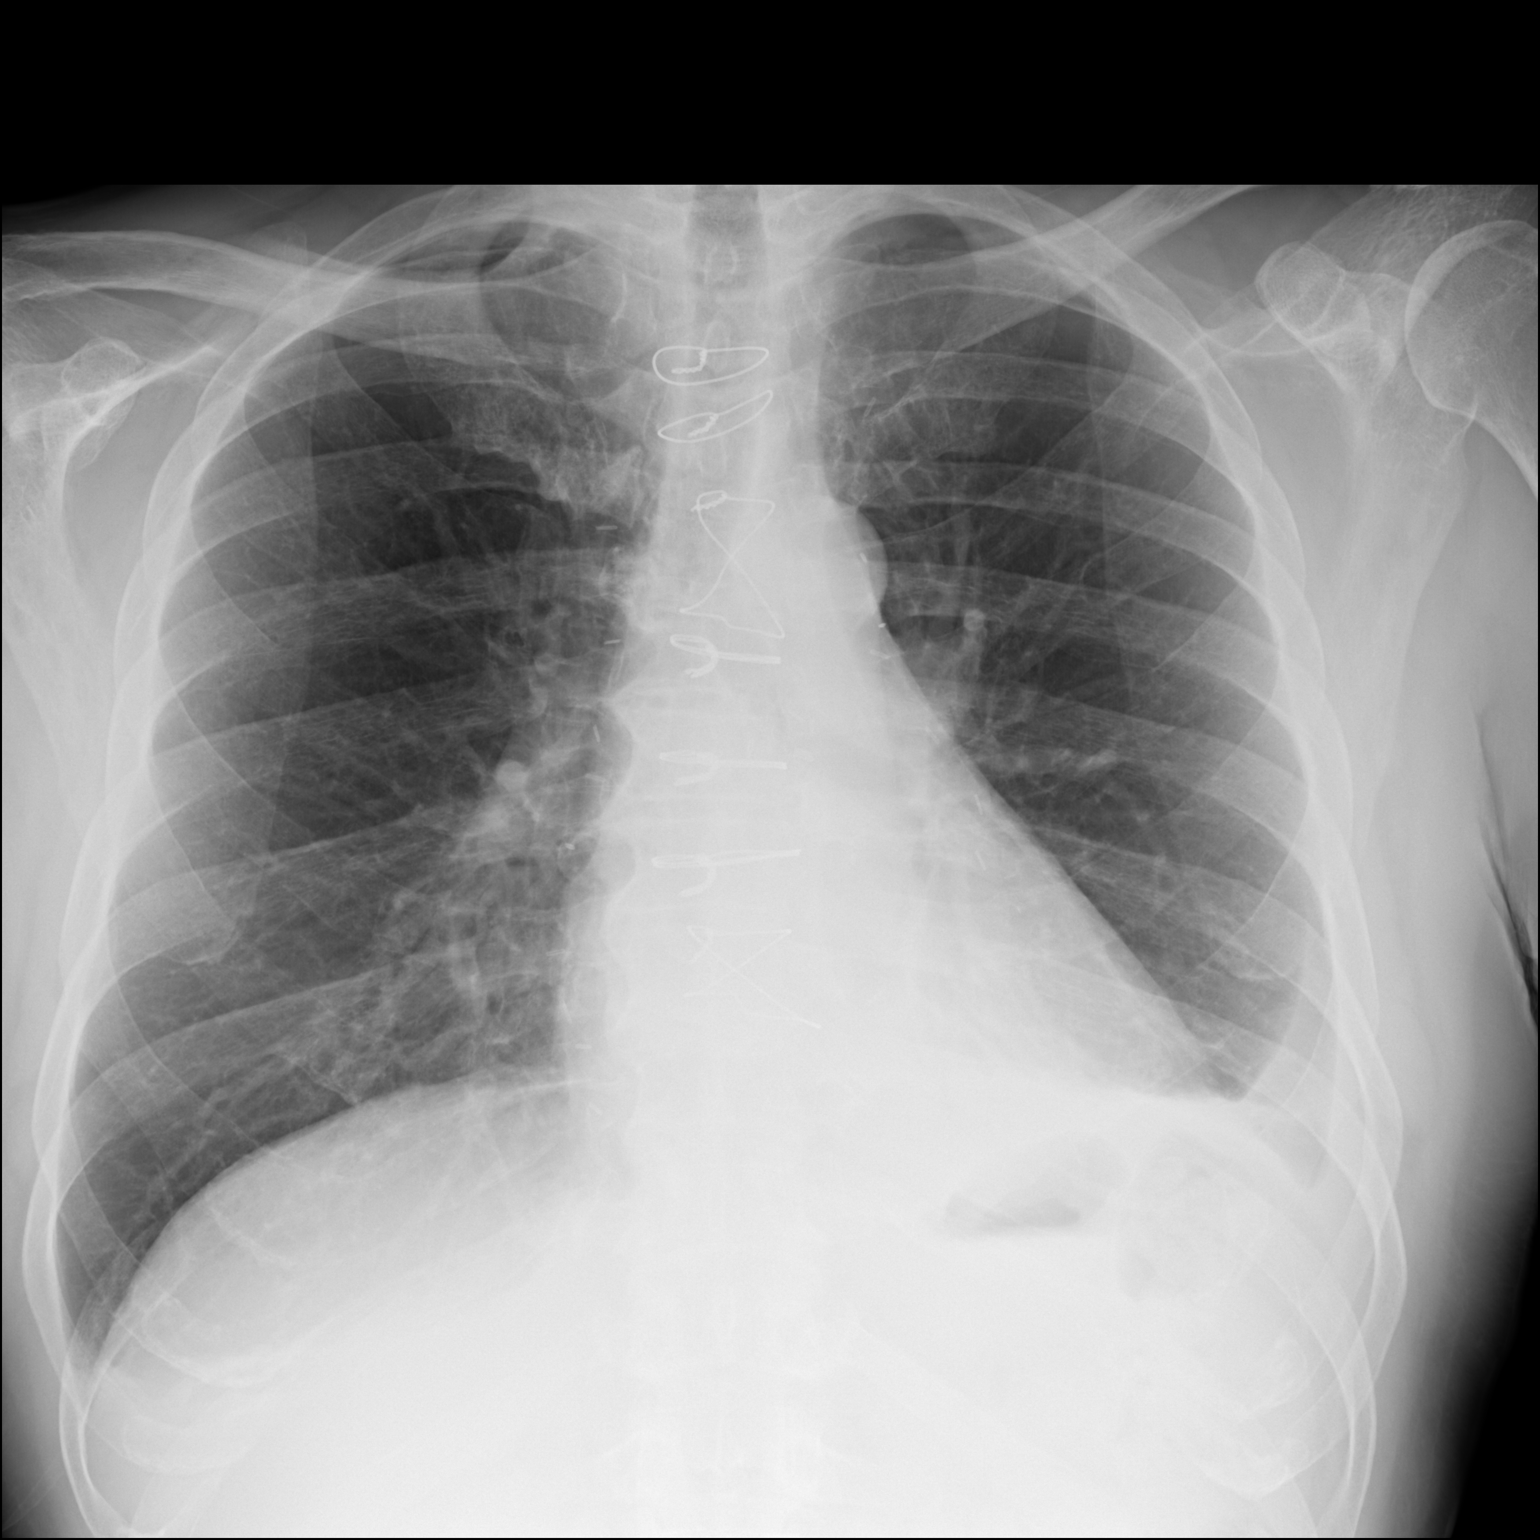

[dg chest 2 view (2 of 2)]
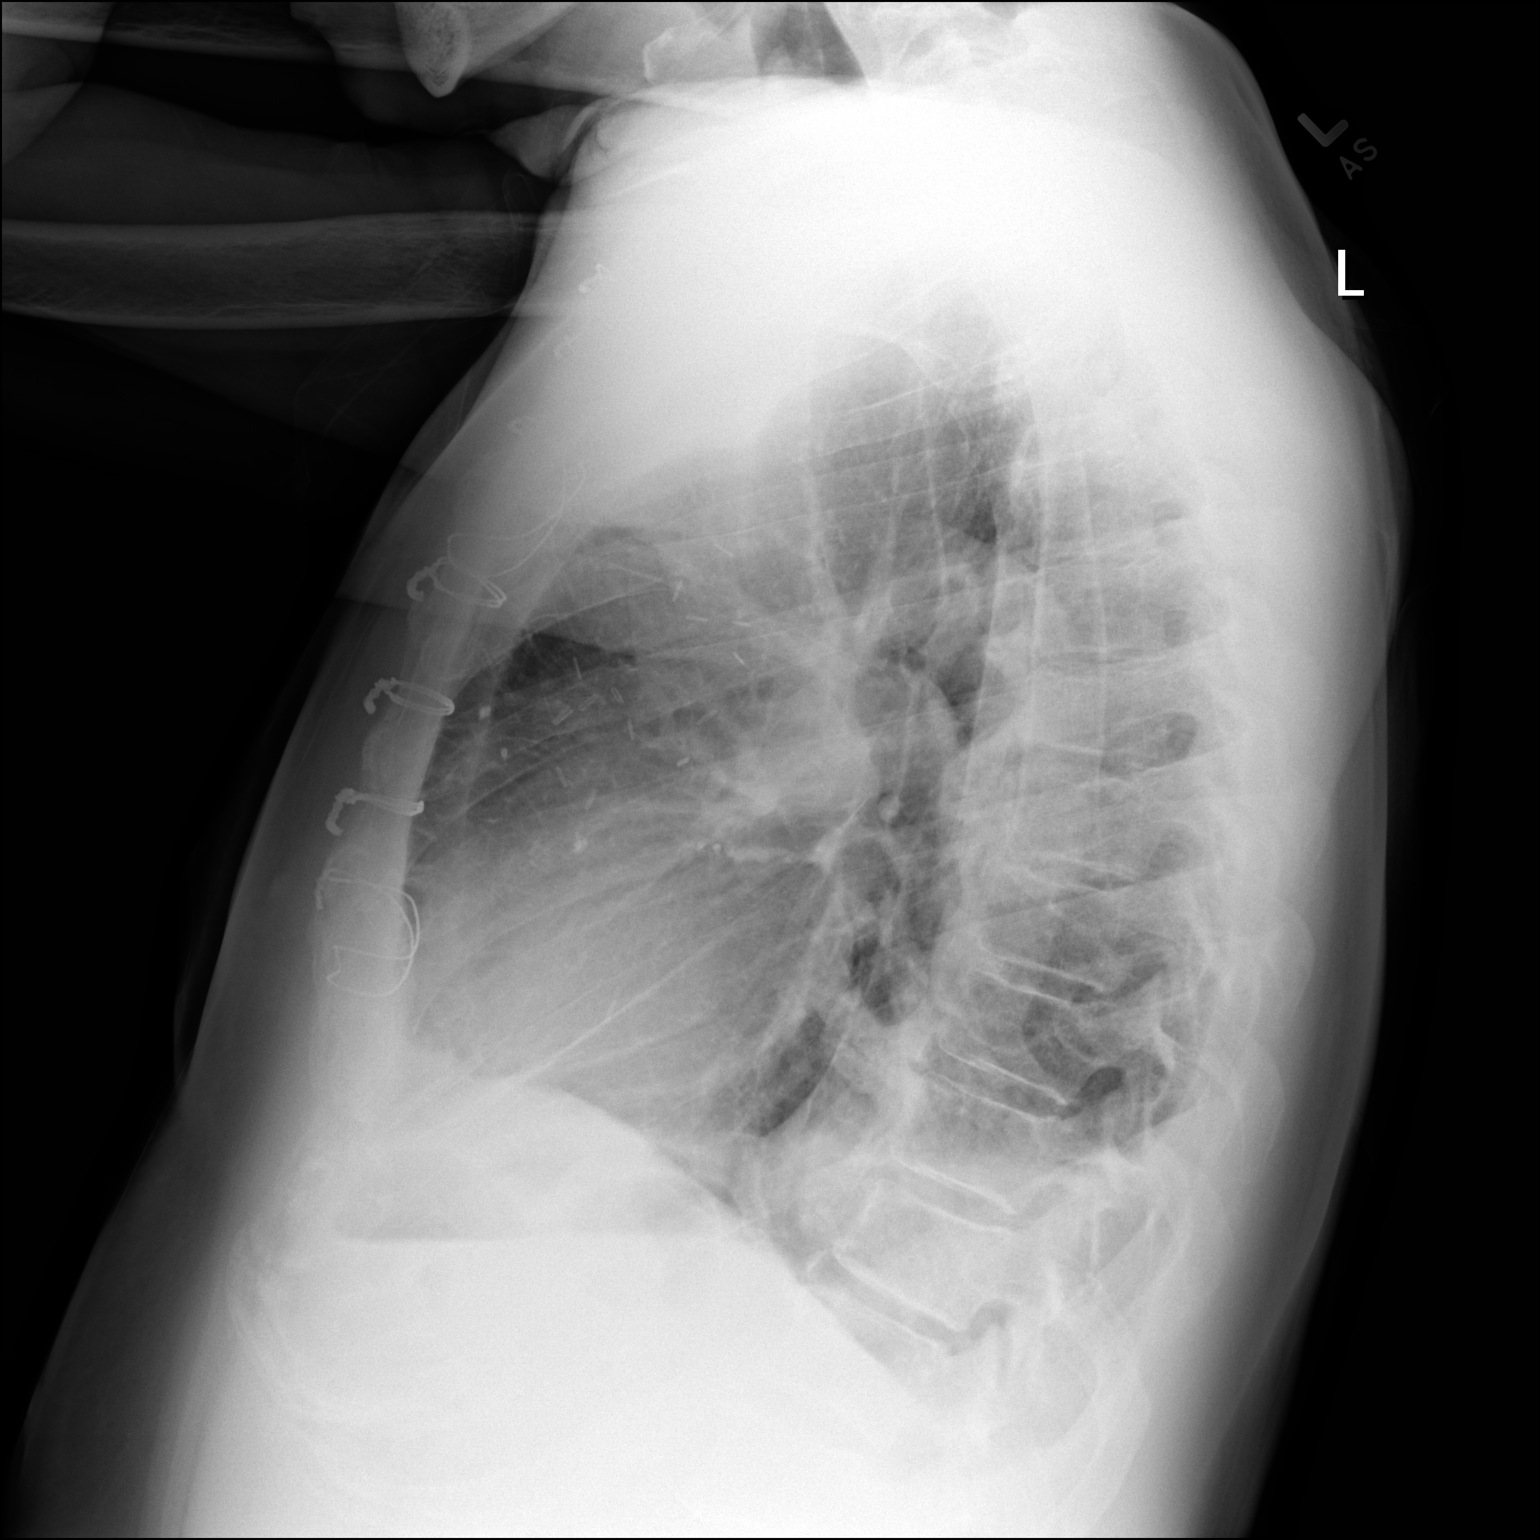

[2 of 2 positions shown; findings below may reference images not displayed]

FINDINGS: Small left pleural effusion noted with apparent atelectatic change
in the left base. Lungs elsewhere clear. Heart size and pulmonary
vascularity are normal. No adenopathy. Status post coronary artery
bypass grafting. No adenopathy. There is degenerative change in the
thoracic spine.
IMPRESSION: Small left pleural effusion with left base atelectasis. Lungs
elsewhere clear. Cardiac silhouette within normal limits.
Postoperative changes noted.

## 2022-01-12 ENCOUNTER — Ambulatory Visit (INDEPENDENT_AMBULATORY_CARE_PROVIDER_SITE_OTHER): Payer: Medicare Other

## 2022-01-12 DIAGNOSIS — I25110 Atherosclerotic heart disease of native coronary artery with unstable angina pectoris: Secondary | ICD-10-CM

## 2022-01-12 DIAGNOSIS — E119 Type 2 diabetes mellitus without complications: Secondary | ICD-10-CM

## 2022-01-12 DIAGNOSIS — I10 Essential (primary) hypertension: Secondary | ICD-10-CM

## 2022-01-12 NOTE — Patient Instructions (Signed)
Visit Information ? ?Following are the goals we discussed today:  ? ?Manage My Medicine  ? ?Timeframe:  Long-Range Goal ?Priority:  Medium ?Start Date:  01/12/2022                           ?Expected End Date:   01/13/2023                   ? ?Follow Up Date 07/2022 ?  ?- call for medicine refill 2 or 3 days before it runs out ?- call if I am sick and can't take my medicine ?- keep a list of all the medicines I take; vitamins and herbals too ?- learn to read medicine labels  ?  ?Why is this important?   ?These steps will help you keep on track with your medicines. ? ?Plan: Telephone follow up appointment with care management team member scheduled for:  6 months ?The patient has been provided with contact information for the care management team and has been advised to call with any health related questions or concerns.  ? ?Tomasa Blase, PharmD ?Clinical Pharmacist, [place]  ? ?Please call the care guide team at [phone] if you need to cancel or reschedule your appointment.  ? ?Patient verbalizes understanding of instructions and care plan provided today and agrees to view in [place]. Active MyChart status confirmed with patient.   ? ?

## 2022-01-12 NOTE — Progress Notes (Addendum)
? ?Chronic Care Management ?Pharmacy Note ? ?01/12/2022 ?Name:  Brian Mccullough MRN:  [id number] DOB:  20-Dec-1950 ? ?Summary: ?-Patient reports compliance to current medications, denies any issues with medications at this time  ?-A1c, LDL, and BP within range with latest check/ office visits  ?-Notes that pain is controlled on current regimen, using tramadol typically twice daily ? ?Recommendations/Changes made from today's visit: ?-Recommending no changes to current medications, encouraged patient to check BP at home at least once weekly, to reach out should BP average >140/90 ?-Patient to reach out to office should he have any questions or concerns about his medications  ? ?Subjective: ?Brian Mccullough is an 71 y.o. year old male who is a primary patient of [name], Evie Lacks, MD.  The CCM team was consulted for assistance with disease management and care coordination needs.   ? ?Engaged with patient by telephone for follow up visit in response to provider referral for pharmacy case management and/or care coordination services.  ? ?Consent to Services:  ?The patient was given the following information about Chronic Care Management services today, agreed to services, and gave verbal consent: 1. CCM service includes personalized support from designated clinical staff supervised by the primary care provider, including individualized plan of care and coordination with other care providers 2. 24/7 contact phone numbers for assistance for urgent and routine care needs. 3. Service will only be billed when office clinical staff spend 20 minutes or more in a month to coordinate care. 4. Only one practitioner may furnish and bill the service in a calendar month. 5.The patient may stop CCM services at any time (effective at the end of the month) by phone call to the office staff. 6. The patient will be responsible for cost sharing (co-pay) of up to 20% of the service fee (after annual deductible is met). Patient agreed to  services and consent obtained. ? ?Patient Care Team: ?[name], Evie Lacks, MD as PCP - General (Internal Medicine) ?Brian Mocha, MD as PCP - Cardiology (Cardiology) ?Brian Memos, MD (Orthopedic Surgery) ?Brian Olds, MD as Consulting Physician (Cardiothoracic Surgery) ?Brian Mccullough as Physician Assistant (Cardiology) ?Brian Mccullough, [facility] as Pharmacist (Pharmacist) ?Brian Seal, MD as Attending Physician (Urology) ?Brian Artist, MD as Consulting Physician (Gastroenterology) ?McKenzie, Candee Furbish, MD as Consulting Physician (Urology) ?Brian Boston, MD as Consulting Physician (Colon and Rectal Surgery) ? ?Recent office visits: ?12/21/2021 - Dr. Alain Marion - tamsulosin stopped per patient  ?09/28/2021 - Dr. Alain Marion - post hospital follow up  - pyelonephritis - rx'd cefuroxime, tizanidine, tramadol ?07/20/2021 - Dr. Jenny Reichmann - urinary issues - started on tamsulosin  ?  ?Recent consult visits:  ?12/19/2021 - Dr. Johney Maine - General Surgery - post op follow up - no changes to medications  ?  ?Hospital visits:  ?11/30/2021 - robotic takedown of colovesical fistula with resection ?08/12/2021 - Hospital Admission - AKI, sepsis due to acute right pyelonephritis complicated by colovesical fistula  - on discharge - vitamin d, furosemide, meloxicam, potassium, tizanidine, tramadol discontinued, prescribed levofloxacin on discharge  ? ?Objective: ? ?Lab Results  ?Component Value Date  ? CREATININE 0.89 12/01/2021  ? BUN 10 12/01/2021  ? GFR 70.76 09/28/2021  ? GFRNONAA >60 12/01/2021  ? GFRAA 79 10/19/2020  ? NA 138 12/01/2021  ? K 3.9 12/01/2021  ? CALCIUM 8.5 (L) 12/01/2021  ? CO2 23 12/01/2021  ? GLUCOSE 116 (H) 12/01/2021  ? ? ?Lab Results  ?Component Value Date/Time  ? HGBA1C 5.2 11/21/2021 12:08  PM  ? HGBA1C 5.9 03/29/2021 09:46 AM  ? GFR 70.76 09/28/2021 11:37 AM  ? GFR 79.84 04/26/2021 09:26 AM  ? MICROALBUR 2.0 (H) 03/29/2021 09:46 AM  ? MICROALBUR <0.2 04/26/2020 09:41 AM  ?  ?Last diabetic Eye exam:   ?Lab Results  ?Component Value Date/Time  ? HMDIABEYEEXA No Retinopathy 05/24/2018 12:00 AM  ?  ?Last diabetic Foot exam:  ?No results found for: HMDIABFOOTEX  ? ?Lab Results  ?Component Value Date  ? CHOL 131 06/20/2021  ? HDL 38 (L) 06/20/2021  ? [place] 69 06/20/2021  ? LDLDIRECT 88.1 01/23/2011  ? TRIG 135 06/20/2021  ? CHOLHDL 3.4 06/20/2021  ? ? ? ?  Latest Ref Rng & Units 09/28/2021  ? 11:37 AM 08/16/2021  ?  4:45 AM 08/15/2021  ?  1:42 AM  ?Hepatic Function  ?Total Protein 6.0 - 8.3 g/dL 7.0      ?Albumin 3.5 - 5.2 g/dL 3.8   1.9   2.0    ?AST 0 - 37 U/L 15      ?ALT 0 - 53 U/L 8      ?Alk Phosphatase 39 - 117 U/L 57      ?Total Bilirubin 0.2 - 1.2 mg/dL 0.5      ? ? ?Lab Results  ?Component Value Date/Time  ? TSH 0.673 08/04/2020 01:28 AM  ? TSH 0.88 07/30/2020 08:30 AM  ? TSH 0.87 08/29/2019 09:11 AM  ? ? ? ?  Latest Ref Rng & Units 12/01/2021  ?  4:41 AM 11/21/2021  ? 12:08 PM 09/28/2021  ? 11:37 AM  ?CBC  ?WBC 4.0 - 10.5 K/uL 12.2   6.0   7.1    ?Hemoglobin 13.0 - 17.0 g/dL 12.2   13.5   13.1    ?Hematocrit 39.0 - 52.0 % 36.6   41.6   39.2    ?Platelets 150 - 400 K/uL 173   181   204.0    ? ? ?No results found for: VD25OH ? ?Clinical ASCVD: Yes  ?The ASCVD Risk score (Arnett DK, et al., 2019) failed to calculate for the following reasons: ?  The patient has a prior MI or stroke diagnosis   ? ? ?  09/27/2021  ?  9:28 AM 12/10/2020  ? 11:31 AM 10/12/2020  ? 12:12 PM  ?Depression screen PHQ 2/9  ?Decreased Interest 0 0 0  ?Down, Depressed, Hopeless 0 0 0  ?PHQ - 2 Score 0 0 0  ? ?Social History  ? ?Tobacco Use  ?Smoking Status Former  ? Types: Cigars  ? Quit date: 08/03/2020  ? Years since quitting: 1.4  ?Smokeless Tobacco Never  ?Tobacco Comments  ? 2-3 small cigars/day  ? ?BP Readings from Last 3 Encounters:  ?12/21/21 112/68  ?12/02/21 (!) 150/96  ?11/29/21 138/64  ? ?Pulse Readings from Last 3 Encounters:  ?12/21/21 61  ?12/02/21 (!) 57  ?11/29/21 86  ? ?Wt Readings from Last 3 Encounters:  ?12/21/21 240 lb  (108.9 kg)  ?12/02/21 240 lb 1.3 oz (108.9 kg)  ?11/29/21 239 lb (108.4 kg)  ? ?BMI Readings from Last 3 Encounters:  ?12/21/21 30.00 kg/m?  ?12/02/21 30.01 kg/m?  ?11/29/21 29.87 kg/m?  ? ? ?Assessment/Interventions: Review of patient past medical history, allergies, medications, health status, including review of consultants reports, laboratory and other test data, was performed as part of comprehensive evaluation and provision of chronic care management services.  ? ?SDOH:  (Social Determinants of Health) assessments and interventions performed: Yes ? ?SDOH  Screenings  ? ?Alcohol Screen: Low Risk   ? Last Alcohol Screening Score (AUDIT): 0  ?Depression (PHQ2-9): Low Risk   ? PHQ-2 Score: 0  ?Financial Resource Strain: Low Risk   ? Difficulty of Paying Living Expenses: Not hard at all  ?Food Insecurity: No Food Insecurity  ? Worried About Charity fundraiser in the Last Year: Never true  ? Ran Out of Food in the Last Year: Never true  ?Housing: Low Risk   ? Last Housing Risk Score: 0  ?Physical Activity: Sufficiently Active  ? Days of Exercise per Week: 5 days  ? Minutes of Exercise per Session: 30 min  ?Social Connections: Moderately Isolated  ? Frequency of Communication with Friends and Family: More than three times a week  ? Frequency of Social Gatherings with Friends and Family: More than three times a week  ? Attends Religious Services: Never  ? Active Member of Clubs or Organizations: No  ? Attends Archivist Meetings: Never  ? Marital Status: Married  ?Stress: No Stress Concern Present  ? Feeling of Stress : Not at all  ?Tobacco Use: Medium Risk  ? Smoking Tobacco Use: Former  ? Smokeless Tobacco Use: Never  ? Passive Exposure: Not on file  ?Transportation Needs: No Transportation Needs  ? Lack of Transportation (Medical): No  ? Lack of Transportation (Non-Medical): No  ? ? ?CCM Care Plan ? ?Allergies  ?Allergen Reactions  ? Codeine Nausea Only  ? Levaquin [Levofloxacin]   ?  Can take it but  feels achy  ? ? ?Medications Reviewed Today   ? ? Reviewed by Brian Mccullough, [facility] (Pharmacist) on 01/12/22 at 1004  Med List Status: <None>  ? ?Medication Order Taking? Sig Documenting Provider Last Dose Sta

## 2022-01-27 ENCOUNTER — Other Ambulatory Visit: Payer: Self-pay | Admitting: *Deleted

## 2022-01-27 MED ORDER — FUROSEMIDE 20 MG PO TABS: ORAL_TABLET | ORAL | 1 refills | Status: DC

## 2022-02-01 NOTE — Progress Notes (Signed)
?Cardiology Office Note:   ? ?Date:  02/02/2022  ? ?ID:  Brian Mccullough, DOB 12-09-1950, MRN [id number] ? ?PCP:  [name], Evie Lacks, MD  ?[facility] Cardiologist:  Sherren Mocha, MD  ?[facility] Electrophysiologist:  None  ? ?Chief Complaint: 87-monthfollow-up ? ?History of Present Illness:   ? ?Brian BECKERMANis a 71y.o. male with a hx of CAD, carotid artery disease, diabetes mellitus, SVT and hyperlipidemia seen for follow-up. ? ?Coronary artery disease ?S/p Inf STEMI 10/21 >> PCI: POBA to RCA ?S/p CABG 10/21 (L-LAD, S-PDA, L radial-OM1/RI, S-D1, RIMA-RPLA) ?Post AFib >> Amiodarone  ?Carotid artery disease ?[place]10/21: R 1-39; L 40-59 ? ?Echocardiogram November 2022 with normal LV function.  No regional wall motion abnormality. ? ?Here today for follow-up.  No complaints.  He started walking without chest pain or shortness of breath.  He has stable myalgias on Lipitor.  Denies chest pain, shortness of breath, orthopnea, PND, syncope, lower extremity edema or melena. ? ?Past Medical History:  ?Diagnosis Date  ? Allergy   ? CAD (coronary artery disease)   ? s/p Inf STEMI 10/21>>POBA to RCA >> s/p CABG (L-LAD, S-PDA, L Radial-OM1/RI, RIMA-RPLA) // post op AF; SVT >> Amiod Rx // Echo 10/21: EF 55-60, no RWMA, GLS -18.5, normal RVSF, mild LAE, trivial MR   ? Carotid artery disease ([place]   ? [place]: R 1-39; L 40-59  ? History of kidney stones   ? Hyperactive gag reflex   ? Hyperlipidemia   ? Hypertension   ? under control with meds., has been on med. x 30 yr.  ? Inguinal hernia 01/2014  ? bilateral  ? LBP (low back pain)   ? Non-insulin dependent type 2 diabetes mellitus ([place]   ? OA (osteoarthritis) of knee 01/2004  ? left  ? Palpitations   ? s/p Inf STEMI 07/2020  ? POBA to RCA >> CABG  ? STEMI involving right coronary artery ([place] 08/03/2020  ? Vitamin B 12 deficiency   ? ? ?Past Surgical History:  ?Procedure Laterality Date  ? CARDIAC CATHETERIZATION    ? CORONARY ARTERY BYPASS GRAFT N/A 08/04/2020  ?  Procedure: CORONARY ARTERY BYPASS GRAFTING (CABG), ON PUMP, TIMES SIX, USING BILATERAL INTERNAL MAMMARY ARTERIES, RIGHT ENDOSCOPICALLY HARVESTED GREATER SAPHENOUS VEIN, AND RIGHT RADIAL ARTERY OPEN HARVEST;  Surgeon: AWonda Olds MD;  [place]  Service: Open Heart Surgery;  Laterality: N/A;  ? CORONARY/GRAFT ACUTE MI REVASCULARIZATION N/A 08/03/2020  ? Procedure: Coronary/Graft Acute MI Revascularization;  Surgeon: CSherren Mocha MD;  [facility];  Service: Cardiovascular;  Laterality: N/A;  ? ENDOVEIN HARVEST OF GREATER SAPHENOUS VEIN Right 08/04/2020  ? Procedure: ENDOVEIN HARVEST OF GREATER SAPHENOUS VEIN;  Surgeon: AWonda Olds MD;  [place] [facility]  Service: Open Heart Surgery;  Laterality: Right;  ? INGUINAL HERNIA REPAIR  age 23-5  ? INGUINAL HERNIA REPAIR Bilateral 02/09/2014  ? Procedure: OPEN BILATERAL INGUINAL HERNIA REPAIR WITH MESH;  Surgeon: HAdin Hector MD;  [place]  Service: General;  Laterality: Bilateral;  ? INSERTION OF MESH Bilateral 02/09/2014  ? Procedure: INSERTION OF MESH;  Surgeon: HAdin Hector MD;  [place] [facility] [place]  Service: General;  Laterality: Bilateral;  ? KNEE ARTHROSCOPY Right   ? LEFT HEART CATH AND CORONARY ANGIOGRAPHY N/A 08/03/2020  ? Procedure: LEFT HEART CATH AND CORONARY ANGIOGRAPHY;  Surgeon: CSherren Mocha MD;  [place];  Service: Cardiovascular;  Laterality: N/A;  ? ORCHIECTOMY Left 05/31/2017  ? Procedure: LEFT RADICAL ORCHIECTOMY;  Surgeon: Irine Seal, MD;  Location[facility];  Service: Urology;  Laterality: Left;  ? PROCTOSCOPY N/A 11/30/2021  ? Procedure: RIGID PROCTOSCOPY;  Surgeon: Michael Boston, MD;  [facility];  Service: General;  Laterality: N/A;  ? RADIAL ARTERY HARVEST Right 08/04/2020  ? Procedure: RADIAL ARTERY HARVEST;  Surgeon: Wonda Olds, MD;  Location: [place];  Service: Open Heart Surgery;  Laterality: Right;  ? TEE  WITHOUT CARDIOVERSION N/A 08/04/2020  ? Procedure: TRANSESOPHAGEAL ECHOCARDIOGRAM (TEE);  Surgeon: Wonda Olds, MD;  Location: [place];  Service: Open Heart Surgery;  Laterality: N/A;  ? [place]  ? TOTAL KNEE ARTHROPLASTY Left 01/14/2004  ? ? ?Current Medications: ?Current Meds  ?Medication Sig  ? acetaminophen (TYLENOL) 500 MG tablet Take 1,000 mg by mouth every 6 (six) hours as needed for headache or moderate pain.  ? aspirin 81 MG EC tablet Take 81 mg by mouth daily.  ? atorvastatin (LIPITOR) 40 MG tablet Take 1 tablet (40 mg total) by mouth daily.  ? benazepril (LOTENSIN) 20 MG tablet Take 1 tablet (20 mg total) by mouth daily.  ? cholecalciferol (VITAMIN D3) 25 MCG (1000 UNIT) tablet Take 1,000 Units by mouth daily.  ? cyanocobalamin 1000 MCG tablet Take 1,000 mcg by mouth daily.  ? furosemide (LASIX) 20 MG tablet TAKE 1 TABLET BY MOUTH AS NEEDED FOR FLUID OR EDEMA  ? loratadine (CLARITIN) 10 MG tablet Take 10 mg by mouth daily as needed for allergies or rhinitis.  ? meloxicam (MOBIC) 15 MG tablet Take 1 tablet (15 mg total) by mouth daily as needed. for pain  ? metFORMIN (GLUCOPHAGE) 500 MG tablet Take 1 tablet (500 mg total) by mouth daily with breakfast.  ? metoprolol tartrate (LOPRESSOR) 25 MG tablet Take 0.5 tablets (12.5 mg total) by mouth 2 (two) times daily.  ? Multiple Vitamin (MULTIVITAMIN WITH MINERALS) TABS tablet Take 1 tablet by mouth daily. (Patient taking differently: Take 1 tablet by mouth once a week.)  ? tiZANidine (ZANAFLEX) 4 MG tablet Take 1 tablet (4 mg total) by mouth every 8 (eight) hours as needed for muscle spasms.  ? traMADol (ULTRAM) 50 MG tablet Take 1-2 tablets (50-100 mg total) by mouth every 6 (six) hours as needed for moderate pain or severe pain.  ? traZODone (DESYREL) 50 MG tablet TAKE 1/2 TO 1 TABLET BY  MOUTH AT BEDTIME AS NEEDED  FOR SLEEP  ? triamcinolone ointment (KENALOG) 0.1 % Apply 1 application. topically 2 (two) times daily as needed  (rash).  ?  ? ?Allergies:   Codeine and Levaquin [levofloxacin]  ? ?Social History  ? ?Socioeconomic History  ? Marital status: Married  ?  Spouse name: Not on file  ? Number of children: Not on file  ? Years of education: 80  ? Highest education level: Not on file  ?Occupational History  ? Occupation: diabled  ?  Employer: UNEMPLOYED  ?  Comment: due to knees  ?Tobacco Use  ? Smoking status: Former  ?  Types: Cigars  ?  Quit date: 08/03/2020  ?  Years since quitting: 1.5  ? Smokeless tobacco: Never  ? Tobacco comments:  ?  2-3 small cigars/day  ?Vaping Use  ? Vaping Use: Never used  ?Substance and Sexual Activity  ? Alcohol use: No  ? Drug use: No  ? Sexual activity: Yes  ?Other Topics Concern  ?  Not on file  ?Social History Narrative  ? Not on file  ? ?Social Determinants of Health  ? ?Financial Resource Strain: Low Risk   ? Difficulty of Paying Living Expenses: Not hard at all  ?Food Insecurity: No Food Insecurity  ? Worried About Charity fundraiser in the Last Year: Never true  ? Ran Out of Food in the Last Year: Never true  ?Transportation Needs: No Transportation Needs  ? Lack of Transportation (Medical): No  ? Lack of Transportation (Non-Medical): No  ?Physical Activity: Sufficiently Active  ? Days of Exercise per Week: 5 days  ? Minutes of Exercise per Session: 30 min  ?Stress: No Stress Concern Present  ? Feeling of Stress : Not at all  ?Social Connections: Moderately Isolated  ? Frequency of Communication with Friends and Family: More than three times a week  ? Frequency of Social Gatherings with Friends and Family: More than three times a week  ? Attends Religious Services: Never  ? Active Member of Clubs or Organizations: No  ? Attends Archivist Meetings: Never  ? Marital Status: Married  ?  ? ?Family History: ?The patient's family history includes Diabetes in his father and mother; Hypertension in his mother. There is no history of Colon cancer, Esophageal cancer, Rectal cancer, Stomach  cancer, or Colon polyps.   ? ?ROS:   ?Please see the history of present illness.    ?All other systems reviewed and are negative.  ? ?EKGs/Labs/Other Studies Reviewed:   ? ?The following studies were revi

## 2022-02-02 ENCOUNTER — Ambulatory Visit: Payer: Medicare Other | Admitting: Physician Assistant

## 2022-02-02 ENCOUNTER — Encounter: Payer: Self-pay | Admitting: Physician Assistant

## 2022-02-02 VITALS — BP 164/82 | HR 66 | Ht 75.0 in | Wt 244.0 lb

## 2022-02-02 DIAGNOSIS — I2510 Atherosclerotic heart disease of native coronary artery without angina pectoris: Secondary | ICD-10-CM

## 2022-02-02 DIAGNOSIS — E782 Mixed hyperlipidemia: Secondary | ICD-10-CM

## 2022-02-02 DIAGNOSIS — I6523 Occlusion and stenosis of bilateral carotid arteries: Secondary | ICD-10-CM

## 2022-02-02 DIAGNOSIS — I10 Essential (primary) hypertension: Secondary | ICD-10-CM

## 2022-02-02 NOTE — Patient Instructions (Signed)
Medication Instructions:  ?Your physician recommends that you continue on your current medications as directed. Please refer to the Current Medication list given to you today. ? ?*If you need a refill on your cardiac medications before your next appointment, please call your pharmacy* ? ?Lab Work: ?NONE ? ?Testing/Procedures: ?NONE ? ?Follow-Up: ?At [facility], you and your health needs are our priority.  As part of our continuing mission to provide you with exceptional heart care, we have created designated Provider Care Teams.  These Care Teams include your primary Cardiologist (physician) and Advanced Practice Providers (APPs -  Physician Assistants and Nurse Practitioners) who all work together to provide you with the care you need, when you need it. ? ?Your next appointment:   ?6 month(s) ? ?The format for your next appointment:   ?In Person ? ?Provider:   ?Sherren Mocha, MD { ? ? ?Important Information About Sugar ? ? ? ? ?  ?

## 2022-02-05 DIAGNOSIS — E119 Type 2 diabetes mellitus without complications: Secondary | ICD-10-CM

## 2022-02-05 DIAGNOSIS — I25110 Atherosclerotic heart disease of native coronary artery with unstable angina pectoris: Secondary | ICD-10-CM | POA: Diagnosis not present

## 2022-02-05 DIAGNOSIS — I10 Essential (primary) hypertension: Secondary | ICD-10-CM

## 2022-03-27 ENCOUNTER — Ambulatory Visit (INDEPENDENT_AMBULATORY_CARE_PROVIDER_SITE_OTHER): Payer: Medicare Other | Admitting: Internal Medicine

## 2022-03-27 ENCOUNTER — Ambulatory Visit (INDEPENDENT_AMBULATORY_CARE_PROVIDER_SITE_OTHER): Payer: Medicare Other

## 2022-03-27 ENCOUNTER — Encounter: Payer: Self-pay | Admitting: Internal Medicine

## 2022-03-27 VITALS — BP 122/78 | HR 62 | Temp 98.7°F | Ht 75.0 in | Wt 238.0 lb

## 2022-03-27 DIAGNOSIS — N321 Vesicointestinal fistula: Secondary | ICD-10-CM

## 2022-03-27 DIAGNOSIS — M159 Polyosteoarthritis, unspecified: Secondary | ICD-10-CM | POA: Diagnosis not present

## 2022-03-27 DIAGNOSIS — G720 Drug-induced myopathy: Secondary | ICD-10-CM

## 2022-03-27 DIAGNOSIS — T466X5A Adverse effect of antihyperlipidemic and antiarteriosclerotic drugs, initial encounter: Secondary | ICD-10-CM

## 2022-03-27 DIAGNOSIS — M5442 Lumbago with sciatica, left side: Secondary | ICD-10-CM | POA: Diagnosis not present

## 2022-03-27 DIAGNOSIS — I6523 Occlusion and stenosis of bilateral carotid arteries: Secondary | ICD-10-CM

## 2022-03-27 DIAGNOSIS — M19011 Primary osteoarthritis, right shoulder: Secondary | ICD-10-CM | POA: Diagnosis not present

## 2022-03-27 DIAGNOSIS — M898X1 Other specified disorders of bone, shoulder: Secondary | ICD-10-CM | POA: Diagnosis not present

## 2022-03-27 DIAGNOSIS — I25110 Atherosclerotic heart disease of native coronary artery with unstable angina pectoris: Secondary | ICD-10-CM

## 2022-03-27 DIAGNOSIS — M150 Primary generalized (osteo)arthritis: Secondary | ICD-10-CM

## 2022-03-27 DIAGNOSIS — Z951 Presence of aortocoronary bypass graft: Secondary | ICD-10-CM

## 2022-03-27 DIAGNOSIS — M5441 Lumbago with sciatica, right side: Secondary | ICD-10-CM

## 2022-03-27 DIAGNOSIS — E1165 Type 2 diabetes mellitus with hyperglycemia: Secondary | ICD-10-CM | POA: Diagnosis not present

## 2022-03-27 DIAGNOSIS — I10 Essential (primary) hypertension: Secondary | ICD-10-CM | POA: Diagnosis not present

## 2022-03-27 DIAGNOSIS — E559 Vitamin D deficiency, unspecified: Secondary | ICD-10-CM

## 2022-03-27 DIAGNOSIS — G8929 Other chronic pain: Secondary | ICD-10-CM

## 2022-03-27 LAB — URINALYSIS
Bilirubin Urine: NEGATIVE
Hgb urine dipstick: NEGATIVE
Ketones, ur: NEGATIVE
Leukocytes,Ua: NEGATIVE
Nitrite: NEGATIVE
Specific Gravity, Urine: 1.02 (ref 1.000–1.030)
Total Protein, Urine: NEGATIVE
Urine Glucose: NEGATIVE
Urobilinogen, UA: 0.2 (ref 0.0–1.0)
pH: 5.5 (ref 5.0–8.0)

## 2022-03-27 LAB — COMPREHENSIVE METABOLIC PANEL
ALT: 9 U/L (ref 0–53)
AST: 14 U/L (ref 0–37)
Albumin: 4 g/dL (ref 3.5–5.2)
Alkaline Phosphatase: 56 U/L (ref 39–117)
BUN: 12 mg/dL (ref 6–23)
CO2: 27 mEq/L (ref 19–32)
Calcium: 9.3 mg/dL (ref 8.4–10.5)
Chloride: 106 mEq/L (ref 96–112)
Creatinine, Ser: 1.12 mg/dL (ref 0.40–1.50)
GFR: 66.76 mL/min (ref >60.00)
Glucose, Bld: 84 mg/dL (ref 70–99)
Potassium: 4.1 mEq/L (ref 3.5–5.1)
Sodium: 139 mEq/L (ref 135–145)
Total Bilirubin: 0.5 mg/dL (ref 0.2–1.2)
Total Protein: 6.9 g/dL (ref 6.0–8.3)

## 2022-03-27 LAB — HEMOGLOBIN A1C: Hgb A1c MFr Bld: 5.9 % (ref 4.6–6.5)

## 2022-03-27 NOTE — Assessment & Plan Note (Signed)
Chronic On Metformin 

## 2022-03-27 NOTE — Assessment & Plan Note (Signed)
Hold lipitor x 1-2 wks to see if pains are better

## 2022-03-27 NOTE — Assessment & Plan Note (Signed)
Blue-Emu cream was recommended to use 2-3 times a day ? ?

## 2022-03-27 NOTE — Assessment & Plan Note (Signed)
R sterno-clavicular joint is big - likely OA. Will X ray

## 2022-03-27 NOTE — Patient Instructions (Signed)
Blue-Emu cream -- use 2-3 times a day ? ?

## 2022-03-27 NOTE — Assessment & Plan Note (Signed)
On Vit D 

## 2022-03-27 NOTE — Assessment & Plan Note (Signed)
Tramadol, Zanaflex prn  Potential benefits of a long term opioids use as well as potential risks (i.e. addiction risk, apnea etc) and complications (i.e. Somnolence, constipation and others) were explained to the patient and were aknowledged. TENS unit for pain

## 2022-03-27 NOTE — Assessment & Plan Note (Signed)
  On Plavix and Lipitor

## 2022-03-27 NOTE — Progress Notes (Signed)
Subjective:  Patient ID: Brian Mccullough, male    DOB: 22-Mar-1951  Age: 71 y.o. MRN: [id number]  CC: No chief complaint on file.   HPI Brian Mccullough presents for CAD, OA, LBP, DM  Outpatient Medications Prior to Visit  Medication Sig Dispense Refill   acetaminophen (TYLENOL) 500 MG tablet Take 1,000 mg by mouth every 6 (six) hours as needed for headache or moderate pain.     aspirin 81 MG EC tablet Take 81 mg by mouth daily.     atorvastatin (LIPITOR) 40 MG tablet Take 1 tablet (40 mg total) by mouth daily. 90 tablet 3   benazepril (LOTENSIN) 20 MG tablet Take 1 tablet (20 mg total) by mouth daily. 90 tablet 3   cholecalciferol (VITAMIN D3) 25 MCG (1000 UNIT) tablet Take 1,000 Units by mouth daily.     clopidogrel (PLAVIX) 75 MG tablet Take 75 mg by mouth daily.     cyanocobalamin 1000 MCG tablet Take 1,000 mcg by mouth daily.     furosemide (LASIX) 20 MG tablet TAKE 1 TABLET BY MOUTH AS NEEDED FOR FLUID OR EDEMA 90 tablet 1   loratadine (CLARITIN) 10 MG tablet Take 10 mg by mouth daily as needed for allergies or rhinitis.     meloxicam (MOBIC) 15 MG tablet Take 1 tablet (15 mg total) by mouth daily as needed. for pain 90 tablet 0   metFORMIN (GLUCOPHAGE) 500 MG tablet Take 1 tablet (500 mg total) by mouth daily with breakfast. 90 tablet 3   metoprolol tartrate (LOPRESSOR) 25 MG tablet Take 0.5 tablets (12.5 mg total) by mouth 2 (two) times daily. 90 tablet 3   Multiple Vitamin (MULTIVITAMIN WITH MINERALS) TABS tablet Take 1 tablet by mouth daily. (Patient taking differently: Take 1 tablet by mouth once a week.) 30 tablet 0   polyethylene glycol-electrolytes (NULYTELY) 420 g solution Take 4,000 mLs by mouth once.     tiZANidine (ZANAFLEX) 4 MG tablet Take 1 tablet (4 mg total) by mouth every 8 (eight) hours as needed for muscle spasms. 90 tablet 0   traMADol (ULTRAM) 50 MG tablet Take 1-2 tablets (50-100 mg total) by mouth every 6 (six) hours as needed for moderate pain or severe  pain. 120 tablet 2   traZODone (DESYREL) 50 MG tablet TAKE 1/2 TO 1 TABLET BY  MOUTH AT BEDTIME AS NEEDED  FOR SLEEP 90 tablet 3   triamcinolone ointment (KENALOG) 0.1 % Apply 1 application. topically 2 (two) times daily as needed (rash). 90 g 3   No facility-administered medications prior to visit.    ROS: Review of Systems  Constitutional:  Negative for appetite change, fatigue and unexpected weight change.  HENT:  Negative for congestion, nosebleeds, sneezing, sore throat and trouble swallowing.   Eyes:  Negative for itching and visual disturbance.  Respiratory:  Negative for cough.   Cardiovascular:  Negative for chest pain, palpitations and leg swelling.  Gastrointestinal:  Negative for abdominal distention, blood in stool, diarrhea and nausea.  Genitourinary:  Negative for frequency and hematuria.  Musculoskeletal:  Positive for arthralgias and back pain. Negative for gait problem, joint swelling and neck pain.  Skin:  Negative for rash.  Neurological:  Negative for dizziness, tremors, speech difficulty and weakness.  Psychiatric/Behavioral:  Negative for agitation, dysphoric mood and sleep disturbance. The patient is not nervous/anxious.     Objective:  BP 122/78 (BP Location: Left Arm, Patient Position: Sitting, Cuff Size: Normal)   Pulse 62   Temp 98.7 F (  37.1 C) (Oral)   Ht '6\' 3"'$  (1.905 m)   Wt 238 lb (108 kg)   SpO2 98%   BMI 29.75 kg/m   BP Readings from Last 3 Encounters:  03/27/22 122/78  02/02/22 (!) 164/82  12/21/21 112/68    Wt Readings from Last 3 Encounters:  03/27/22 238 lb (108 kg)  02/02/22 244 lb (110.7 kg)  12/21/21 240 lb (108.9 kg)    Physical Exam Constitutional:      General: He is not in acute distress.    Appearance: Normal appearance. He is well-developed.     Comments: NAD  Eyes:     Conjunctiva/sclera: Conjunctivae normal.     Pupils: Pupils are equal, round, and reactive to light.  Neck:     Thyroid: No thyromegaly.      Vascular: No JVD.  Cardiovascular:     Rate and Rhythm: Normal rate and regular rhythm.     Heart sounds: Normal heart sounds. No murmur heard.    No friction rub. No gallop.  Pulmonary:     Effort: Pulmonary effort is normal. No respiratory distress.     Breath sounds: Normal breath sounds. No wheezing or rales.  Chest:     Chest wall: No tenderness.  Abdominal:     General: Bowel sounds are normal. There is no distension.     Palpations: Abdomen is soft. There is no mass.     Tenderness: There is no abdominal tenderness. There is no guarding or rebound.  Musculoskeletal:        General: No tenderness. Normal range of motion.     Cervical back: Normal range of motion.  Lymphadenopathy:     Cervical: No cervical adenopathy.  Skin:    General: Skin is warm and dry.     Findings: No rash.  Neurological:     Mental Status: He is alert and oriented to person, place, and time.     Cranial Nerves: No cranial nerve deficit.     Motor: No abnormal muscle tone.     Coordination: Coordination normal.     Gait: Gait normal.     Deep Tendon Reflexes: Reflexes are normal and symmetric.  Psychiatric:        Behavior: Behavior normal.        Thought Content: Thought content normal.        Judgment: Judgment normal.   R sterno-clavicular joint is big  Lab Results  Component Value Date   WBC 12.2 (H) 12/01/2021   HGB 12.2 (L) 12/01/2021   HCT 36.6 (L) 12/01/2021   PLT 173 12/01/2021   GLUCOSE 116 (H) 12/01/2021   CHOL 131 06/20/2021   TRIG 135 06/20/2021   HDL 38 (L) 06/20/2021   LDLDIRECT 88.1 01/23/2011   LDLCALC 69 06/20/2021   ALT 8 09/28/2021   AST 15 09/28/2021   NA 138 12/01/2021   K 3.9 12/01/2021   CL 111 12/01/2021   CREATININE 0.89 12/01/2021   BUN 10 12/01/2021   CO2 23 12/01/2021   TSH 0.673 08/04/2020   PSA 0.26 07/30/2020   INR 1.5 (H) 08/04/2020   HGBA1C 5.2 11/21/2021   MICROALBUR 2.0 (H) 03/29/2021    No results found.  Assessment & Plan:   Problem  List Items Addressed This Visit     CAD s/p CABG x 6 in October 2021    Hold lipitor x 1-2 wks to see if pains are better      Carotid artery stenosis  On Plavix and Lipitor      Coronary artery disease    Hold lipitor x 1-2 wks to see if pains are better      Diabetes mellitus type 2, controlled (HCC)    Chronic On Metformin      HTN (hypertension)     Cont w/Benazepril, Toprol      LOW BACK PAIN    Tramadol, Zanaflex prn  Potential benefits of a long term opioids use as well as potential risks (i.e. addiction risk, apnea etc) and complications (i.e. Somnolence, constipation and others) were explained to the patient and were aknowledged. TENS unit for pain      Osteoarthritis    Blue-Emu cream was recommended to use 2-3 times a day       Pain of right clavicle - Primary    R sterno-clavicular joint is big - likely OA. Will X ray      Relevant Orders   DG Clavicle Right   Statin myopathy    Hold lipitor x 1-2 wks to see if pains are better       Vitamin D deficiency    On Vit D         No orders of the defined types were placed in this encounter.     Follow-up: Return in about 4 months (around 07/27/2022) for a follow-up visit.  Walker Kehr, MD

## 2022-03-27 NOTE — Assessment & Plan Note (Signed)
  Cont w/Benazepril, Toprol

## 2022-03-29 ENCOUNTER — Other Ambulatory Visit: Payer: Self-pay | Admitting: Cardiovascular Disease

## 2022-04-04 ENCOUNTER — Other Ambulatory Visit: Payer: Self-pay | Admitting: Internal Medicine

## 2022-05-03 ENCOUNTER — Telehealth: Payer: Self-pay

## 2022-05-03 MED ORDER — METOPROLOL TARTRATE 25 MG PO TABS: 12.5000 mg | ORAL_TABLET | Freq: Two times a day (BID) | ORAL | 3 refills | Status: DC

## 2022-05-03 NOTE — Telephone Encounter (Signed)
Notified pt 12.5 mg does not come in metoprolol tartrate. Will have to do 25 mg 1/2 bid. [facility]

## 2022-05-03 NOTE — Telephone Encounter (Signed)
Pt is requesting a refill on: metoprolol tartrate (LOPRESSOR) 25 MG tablet  Pharmacy: [id number]- [address], [place] - [address] Pt is requesting a 90 day supply and asking if this can be ordered in 12.5 mg vs 25 mg so he will not have to half it.  Please advise  Pt only has 1 pill left

## 2022-06-11 ENCOUNTER — Other Ambulatory Visit: Payer: Self-pay | Admitting: Internal Medicine

## 2022-06-27 DIAGNOSIS — H2511 Age-related nuclear cataract, right eye: Secondary | ICD-10-CM | POA: Diagnosis not present

## 2022-06-27 DIAGNOSIS — H25012 Cortical age-related cataract, left eye: Secondary | ICD-10-CM | POA: Diagnosis not present

## 2022-06-27 DIAGNOSIS — E119 Type 2 diabetes mellitus without complications: Secondary | ICD-10-CM | POA: Diagnosis not present

## 2022-06-27 LAB — HM DIABETES EYE EXAM

## 2022-07-13 ENCOUNTER — Telehealth: Payer: Medicare Other

## 2022-07-27 ENCOUNTER — Ambulatory Visit: Payer: Medicare Other | Admitting: Internal Medicine

## 2022-07-31 ENCOUNTER — Encounter: Payer: Self-pay | Admitting: Internal Medicine

## 2022-08-07 ENCOUNTER — Other Ambulatory Visit: Payer: Self-pay | Admitting: Internal Medicine

## 2022-08-07 ENCOUNTER — Encounter: Payer: Self-pay | Admitting: Internal Medicine

## 2022-08-07 ENCOUNTER — Ambulatory Visit (INDEPENDENT_AMBULATORY_CARE_PROVIDER_SITE_OTHER): Payer: Medicare Other | Admitting: Internal Medicine

## 2022-08-07 VITALS — BP 142/68 | HR 54 | Temp 98.1°F | Ht 75.0 in | Wt 259.8 lb

## 2022-08-07 DIAGNOSIS — I25110 Atherosclerotic heart disease of native coronary artery with unstable angina pectoris: Secondary | ICD-10-CM

## 2022-08-07 DIAGNOSIS — R202 Paresthesia of skin: Secondary | ICD-10-CM

## 2022-08-07 DIAGNOSIS — E1165 Type 2 diabetes mellitus with hyperglycemia: Secondary | ICD-10-CM | POA: Diagnosis not present

## 2022-08-07 DIAGNOSIS — Z23 Encounter for immunization: Secondary | ICD-10-CM

## 2022-08-07 DIAGNOSIS — I10 Essential (primary) hypertension: Secondary | ICD-10-CM

## 2022-08-07 LAB — COMPREHENSIVE METABOLIC PANEL
ALT: 8 U/L (ref 0–53)
AST: 12 U/L (ref 0–37)
Albumin: 4 g/dL (ref 3.5–5.2)
Alkaline Phosphatase: 60 U/L (ref 39–117)
BUN: 12 mg/dL (ref 6–23)
CO2: 26 mEq/L (ref 19–32)
Calcium: 9.2 mg/dL (ref 8.4–10.5)
Chloride: 106 mEq/L (ref 96–112)
Creatinine, Ser: 1.27 mg/dL (ref 0.40–1.50)
GFR: 57.26 mL/min — ABNORMAL LOW (ref >60.00)
Glucose, Bld: 89 mg/dL (ref 70–99)
Potassium: 4.1 mEq/L (ref 3.5–5.1)
Sodium: 139 mEq/L (ref 135–145)
Total Bilirubin: 0.5 mg/dL (ref 0.2–1.2)
Total Protein: 6.8 g/dL (ref 6.0–8.3)

## 2022-08-07 LAB — TSH: TSH: 0.7 u[IU]/mL (ref 0.35–5.50)

## 2022-08-07 LAB — HEMOGLOBIN A1C: Hgb A1c MFr Bld: 6.1 % (ref 4.6–6.5)

## 2022-08-07 LAB — VITAMIN B12: Vitamin B-12: 485 pg/mL (ref 211–911)

## 2022-08-07 MED ORDER — EMPAGLIFLOZIN 10 MG PO TABS: 10.0000 mg | ORAL_TABLET | Freq: Every day | ORAL | 11 refills | Status: DC

## 2022-08-07 NOTE — Assessment & Plan Note (Signed)
Will add Jardiance Hydrate well

## 2022-08-07 NOTE — Patient Instructions (Addendum)
PITAVASTATIN ?Posterior Tibial Tendinitis Posterior tibial tendinitis is irritation of a tendon called the posterior tibial tendon. Your posterior tibial tendon is a cord-like tissue that connects bones of your lower leg and foot to a muscle that: Supports your arch. Helps you raise up on your toes. Helps you turn your foot down and in. This condition causes foot and ankle pain. It can also lead to a flat foot. What are the causes? This condition is most often caused by repeated stress to the tendon (overuse injury). It can also be caused by a sudden injury that stresses the tendon, such as landing on your foot after jumping or falling. What increases the risk? This condition is more likely to develop in: People who play a sport that involves putting a lot of pressure on the feet, such as: Basketball. Tennis. Soccer. Hockey. Runners. Females who are older than 70 years of age and are overweight. People with diabetes. People with decreased foot stability. People with flat feet. What are the signs or symptoms? Symptoms include: Pain in the inner ankle. Pain at the arch of your foot. Pain that gets worse with running, walking, or standing. Swelling on the inside of your ankle and foot. Weakness in your ankle or foot. Inability to stand up on tiptoe. Flattening of the arch of your foot. How is this diagnosed? This condition may be diagnosed based on: Your symptoms. Your medical history. A physical exam. Tests, such as: X-ray. MRI. Ultrasound. How is this treated? This condition may be treated by: Putting ice to the injured area. Taking NSAIDs, such as ibuprofen, to reduce pain and swelling. Wearing a special shoe or shoe insert to support your arch (orthotic). Having physical therapy. Replacing high-impact exercise with low-impact exercise, such as swimming or cycling. If your symptoms do not improve with these treatments, you may need to wear a splint, removable walking  boot, or short leg cast for 6-8 weeks to keep your foot and ankle still (immobilized). Follow these instructions at home: If you have a cast, splint, or boot: Keep it clean and dry. Check the skin around it every day. Tell your health care provider about any concerns. If you have a cast: Do not stick anything inside it to scratch your skin. Doing that increases your risk of infection. You may put lotion on dry skin around the edges of the cast. Do not put lotion on the skin underneath the cast. If you have a splint or boot: Wear it as told by your health care provider. Remove it only as told by your health care provider. Loosen it if your toes tingle, become numb, or turn cold and blue. Bathing Do not take baths, swim, or use a hot tub until your health care provider approves. Ask your health care provider if you may take showers. If your cast, splint, or boot is not waterproof: Do not let it get wet. Cover it with a waterproof covering while you take a bath or a shower. Managing pain and swelling  If directed, put ice on the injured area. If you have a removable splint or boot, remove it as told by your health care provider. Put ice in a plastic bag. Place a towel between your skin and the bag or between your cast and the bag. Leave the ice on for 20 minutes, 2-3 times a day. Move your toes often to reduce stiffness and swelling. Raise (elevate) the injured area above the level of your heart while you are sitting or  lying down. Activity Do not use the injured foot to support your body weight until your health care provider says that you can. Use crutches as told by your health care provider. Do not do activities that make pain or swelling worse. Ask your health care provider when it is safe to drive if you have a cast, splint, or boot on your foot. Return to your normal activities as told by your health care provider. Ask your health care provider what activities are safe for you. Do  exercises as told by your health care provider. General instructions Take over-the-counter and prescription medicines only as told by your health care provider. If you have an orthotic, use it as told by your health care provider. Keep all follow-up visits as told by your health care provider. This is important. How is this prevented? Wear footwear that is appropriate to your athletic activity. Avoid athletic activities that cause pain or swelling in your ankle or foot. Before being active, do range-of-motion and stretching exercises. If you develop pain or swelling while training, stop training. If you have pain or swelling that does not improve after a few days of rest, see your health care provider. If you start a new athletic activity, start gradually so you can build up your strength and flexibility. Contact a health care provider if: Your symptoms get worse. Your symptoms do not improve in 6-8 weeks. You develop new, unexplained symptoms. Your splint, boot, or cast gets damaged. Summary Posterior tibial tendinitis is irritation of a tendon called the posterior tibial tendon. This condition is most often caused by repeated stress to the tendon (overuse injury). This condition causes foot pain and ankle pain. It can also lead to a flat foot. This condition may be treated by not doing high-impact activities, applying ice, having physical therapy, wearing orthotics, and wearing a cast, splint, or boot if needed. This information is not intended to replace advice given to you by your health care provider. Make sure you discuss any questions you have with your health care provider. Document Revised: 01/21/2019 Document Reviewed: 11/28/2018 Elsevier Patient Education  [place].

## 2022-08-07 NOTE — Progress Notes (Signed)
Subjective:  Patient ID: Brian Mccullough, male    DOB: Nov 13, 1950  Age: 71 y.o. MRN: [id number]  CC: Follow-up (4 month f/u- Flu shot)   HPI Brian Mccullough presents for DM, HTN, CAD f/u C/o R forearm pain post-op C/o L inner ankle pain   Outpatient Medications Prior to Visit  Medication Sig Dispense Refill   acetaminophen (TYLENOL) 500 MG tablet Take 1,000 mg by mouth every 6 (six) hours as needed for headache or moderate pain.     aspirin 81 MG EC tablet Take 81 mg by mouth daily.     atorvastatin (LIPITOR) 40 MG tablet Take 1 tablet (40 mg total) by mouth daily. 90 tablet 3   benazepril (LOTENSIN) 20 MG tablet Take 1 tablet (20 mg total) by mouth daily. 90 tablet 3   cholecalciferol (VITAMIN D3) 25 MCG (1000 UNIT) tablet Take 1,000 Units by mouth daily.     cyanocobalamin 1000 MCG tablet Take 1,000 mcg by mouth daily.     furosemide (LASIX) 20 MG tablet TAKE 1 TABLET BY MOUTH AS NEEDED FOR FLUID OR EDEMA. MAX 1 TABLET PER DAY. 100 tablet 2   loratadine (CLARITIN) 10 MG tablet Take 10 mg by mouth daily as needed for allergies or rhinitis.     meloxicam (MOBIC) 15 MG tablet TAKE 1 TABLET BY MOUTH DAILY AS  NEEDED FOR PAIN 90 tablet 3   metFORMIN (GLUCOPHAGE) 500 MG tablet Take 1 tablet (500 mg total) by mouth daily with breakfast. 90 tablet 3   metoprolol tartrate (LOPRESSOR) 25 MG tablet Take 0.5 tablets (12.5 mg total) by mouth 2 (two) times daily. 90 tablet 3   Multiple Vitamin (MULTIVITAMIN WITH MINERALS) TABS tablet Take 1 tablet by mouth daily. (Patient taking differently: Take 1 tablet by mouth once a week.) 30 tablet 0   polyethylene glycol-electrolytes (NULYTELY) 420 g solution Take 4,000 mLs by mouth once.     tiZANidine (ZANAFLEX) 4 MG tablet Take 1 tablet (4 mg total) by mouth every 8 (eight) hours as needed for muscle spasms. 90 tablet 0   traMADol (ULTRAM) 50 MG tablet Take 1-2 tablets (50-100 mg total) by mouth every 6 (six) hours as needed for moderate pain or severe  pain. 120 tablet 2   traZODone (DESYREL) 50 MG tablet TAKE 1/2 TO 1 TABLET BY  MOUTH AT BEDTIME AS NEEDED  FOR SLEEP 90 tablet 3   triamcinolone ointment (KENALOG) 0.1 % Apply 1 application. topically 2 (two) times daily as needed (rash). 90 g 3   clopidogrel (PLAVIX) 75 MG tablet TAKE 1 TABLET BY MOUTH DAILY (Patient not taking: Reported on 08/07/2022) 100 tablet 2   No facility-administered medications prior to visit.    ROS: Review of Systems  Constitutional:  Negative for appetite change, fatigue and unexpected weight change.  HENT:  Negative for congestion, nosebleeds, sneezing, sore throat and trouble swallowing.   Eyes:  Negative for itching and visual disturbance.  Respiratory:  Negative for cough.   Cardiovascular:  Negative for chest pain, palpitations and leg swelling.  Gastrointestinal:  Negative for abdominal distention, blood in stool, diarrhea and nausea.  Genitourinary:  Negative for frequency and hematuria.  Musculoskeletal:  Negative for back pain, gait problem, joint swelling and neck pain.  Skin:  Negative for rash.  Neurological:  Negative for dizziness, tremors, speech difficulty and weakness.  Psychiatric/Behavioral:  Negative for agitation, dysphoric mood and sleep disturbance. The patient is not nervous/anxious.     Objective:  BP (!) 142/68 (  BP Location: Left Arm)   Pulse (!) 54   Temp 98.1 F (36.7 C) (Oral)   Ht '6\' 3"'$  (1.905 m)   Wt 259 lb 12.8 oz (117.8 kg)   SpO2 98%   BMI 32.47 kg/m   BP Readings from Last 3 Encounters:  08/07/22 (!) 142/68  03/27/22 122/78  02/02/22 (!) 164/82    Wt Readings from Last 3 Encounters:  08/07/22 259 lb 12.8 oz (117.8 kg)  03/27/22 238 lb (108 kg)  02/02/22 244 lb (110.7 kg)    Physical Exam Constitutional:      General: He is not in acute distress.    Appearance: He is well-developed. He is not diaphoretic.  HENT:     Head: Normocephalic and atraumatic.     Right Ear: External ear normal.     Left Ear:  External ear normal.     Nose: Nose normal.     Mouth/Throat:     Pharynx: No oropharyngeal exudate.  Eyes:     General: No scleral icterus.       Right eye: No discharge.        Left eye: No discharge.     Conjunctiva/sclera: Conjunctivae normal.     Pupils: Pupils are equal, round, and reactive to light.  Neck:     Thyroid: No thyromegaly.     Vascular: No JVD.     Trachea: No tracheal deviation.  Cardiovascular:     Rate and Rhythm: Normal rate and regular rhythm.     Heart sounds: Normal heart sounds. No murmur heard.    No friction rub. No gallop.  Pulmonary:     Effort: Pulmonary effort is normal. No respiratory distress.     Breath sounds: Normal breath sounds. No stridor. No wheezing or rales.  Chest:     Chest wall: No tenderness.  Abdominal:     General: Bowel sounds are normal. There is no distension.     Palpations: Abdomen is soft. There is no mass.     Tenderness: There is no abdominal tenderness. There is no guarding or rebound.  Genitourinary:    Penis: No tenderness.      Rectum: Guaiac result negative.  Musculoskeletal:        General: No tenderness. Normal range of motion.     Cervical back: Normal range of motion and neck supple.  Lymphadenopathy:     Cervical: No cervical adenopathy.  Skin:    General: Skin is warm and dry.     Coloration: Skin is not pale.     Findings: No erythema or rash.  Neurological:     Mental Status: He is alert and oriented to person, place, and time.     Cranial Nerves: No cranial nerve deficit.     Motor: No abnormal muscle tone.     Coordination: Coordination normal.     Deep Tendon Reflexes: Reflexes are normal and symmetric. Reflexes normal.  Psychiatric:        Behavior: Behavior normal.        Thought Content: Thought content normal.        Judgment: Judgment normal.   Flat feet L inner foot w/pain Lab Results  Component Value Date   WBC 12.2 (H) 12/01/2021   HGB 12.2 (L) 12/01/2021   HCT 36.6 (L) 12/01/2021    PLT 173 12/01/2021   GLUCOSE 84 03/27/2022   CHOL 131 06/20/2021   TRIG 135 06/20/2021   HDL 38 (L) 06/20/2021   LDLDIRECT 88.1 01/23/2011  LDLCALC 69 06/20/2021   ALT 9 03/27/2022   AST 14 03/27/2022   NA 139 03/27/2022   K 4.1 03/27/2022   CL 106 03/27/2022   CREATININE 1.12 03/27/2022   BUN 12 03/27/2022   CO2 27 03/27/2022   TSH 0.673 08/04/2020   PSA 0.26 07/30/2020   INR 1.5 (H) 08/04/2020   HGBA1C 5.9 03/27/2022   MICROALBUR 2.0 (H) 03/29/2021    No results found.  Assessment & Plan:   Problem List Items Addressed This Visit     Coronary artery disease    Cont w/ current meds including aspirin, Plavix, atorvastatin, metoprolol, benazepril, furosemide Consider PITAVASTATIN      Diabetes mellitus type 2, controlled ([place]) - Primary    Will add Jardiance Hydrate well      Relevant Medications   empagliflozin (JARDIANCE) 10 MG TABS tablet   Other Relevant Orders   Comprehensive metabolic panel   Hemoglobin A1c   TSH   HTN (hypertension)    Will add Jardiance Hydrate well      Other Visit Diagnoses     Needs flu shot       Relevant Orders   Flu Vaccine QUAD High Dose(Fluad) (Completed)   Paresthesia       Relevant Orders   TSH   Vitamin B12         Meds ordered this encounter  Medications   empagliflozin (JARDIANCE) 10 MG TABS tablet    Sig: Take 1 tablet (10 mg total) by mouth daily.    Dispense:  30 tablet    Refill:  11      Follow-up: Return in about 3 months (around 11/07/2022) for a follow-up visit.  Walker Kehr, MD

## 2022-08-07 NOTE — Assessment & Plan Note (Addendum)
Cont w/ current meds including aspirin, Plavix, atorvastatin, metoprolol, benazepril, furosemide Consider PITAVASTATIN

## 2022-08-15 NOTE — Progress Notes (Addendum)
Office Visit    Patient Name: Brian Mccullough Date of Encounter: 08/15/2022  Primary Care Provider:  Cassandria Anger, MD Primary Cardiologist:  Sherren Mocha, MD Primary Electrophysiologist: None  Chief Complaint    Brian Mccullough is a 71 y.o. male with PMH of CAD s/p inferior STEMI 2021 with angioplasty to RCA and CABG x6, carotid artery disease (right 1-39%, left 40-59%), HTN, HLD, DM type II, SVT who presents today for 35-monthfollow-up of coronary artery disease.  Past Medical History    Past Medical History:  Diagnosis Date   Allergy    CAD (coronary artery disease)    s/p Inf STEMI 10/21>>POBA to RCA >> s/p CABG (L-LAD, S-PDA, L Radial-OM1/RI, RIMA-RPLA) // post op AF; SVT >> Amiod Rx // Echo 10/21: EF 55-60, no RWMA, GLS -18.5, normal RVSF, mild LAE, trivial MR    Carotid artery disease ([place]    [place]: R 1-39; L 40-59   History of kidney stones    Hyperactive gag reflex    Hyperlipidemia    Hypertension    under control with meds., has been on med. x 30 yr.   Inguinal hernia 01/2014   bilateral   LBP (low back pain)    Non-insulin dependent type 2 diabetes mellitus (HCC)    OA (osteoarthritis) of knee 01/2004   left   Palpitations    s/p Inf STEMI 07/2020   POBA to RCA >> CABG   [place]   Vitamin B 12 deficiency    Past Surgical History:  Procedure Laterality Date   CARDIAC CATHETERIZATION     CORONARY ARTERY BYPASS GRAFT N/A 08/04/2020   Procedure: CORONARY ARTERY BYPASS GRAFTING (CABG), ON PUMP, TIMES SIX, USING BILATERAL INTERNAL MAMMARY ARTERIES, RIGHT ENDOSCOPICALLY HARVESTED GREATER SAPHENOUS VEIN, AND RIGHT RADIAL ARTERY OPEN HARVEST;  Surgeon: AWonda Olds MD;  [place] [facility]  Service: Open Heart Surgery;  Laterality: N/A;   CORONARY/GRAFT ACUTE MI REVASCULARIZATION N/A 08/03/2020   Procedure: Coronary/Graft Acute MI Revascularization;  Surgeon: CSherren Mocha MD;  [facility];  Service: Cardiovascular;  Laterality: N/A;   ENDOVEIN HARVEST OF GREATER SAPHENOUS VEIN Right 08/04/2020   Procedure: ENDOVEIN HARVEST OF GREATER SAPHENOUS VEIN;  Surgeon: AWonda Olds MD;  [place] [facility]  Service: Open Heart Surgery;  Laterality: Right;   INGUINAL HERNIA REPAIR  age [age]  INGUINAL HERNIA REPAIR Bilateral 02/09/2014   Procedure: OPEN BILATERAL INGUINAL HERNIA REPAIR WITH MESH;  Surgeon: HAdin Hector MD;  [place] [facility]  Service: General;  Laterality: Bilateral;   INSERTION OF MESH Bilateral 02/09/2014   Procedure: INSERTION OF MESH;  Surgeon: HAdin Hector MD;  [facility]  Service: General;  Laterality: Bilateral;   KNEE ARTHROSCOPY Right    LEFT HEART CATH AND CORONARY ANGIOGRAPHY N/A 08/03/2020   Procedure: LEFT HEART CATH AND CORONARY ANGIOGRAPHY;  Surgeon: CSherren Mocha MD;  [facility];  Service: Cardiovascular;  Laterality: N/A;   ORCHIECTOMY Left 05/31/2017   Procedure: LEFT RADICAL ORCHIECTOMY;  Surgeon: WIrine Seal MD;  [facility]  Service: Urology;  Laterality: Left;   PROCTOSCOPY N/A 11/30/2021   Procedure: RIGID PROCTOSCOPY;  Surgeon: GMichael Boston MD;  [facility];  Service: General;  Laterality: N/A;   RADIAL ARTERY HARVEST Right 08/04/2020   Procedure: RADIAL ARTERY HARVEST;  Surgeon: AWonda Olds MD;  [place] [facility]  Service: Open Heart Surgery;  Laterality: Right;   TEE WITHOUT CARDIOVERSION N/A 08/04/2020   Procedure: TRANSESOPHAGEAL ECHOCARDIOGRAM (TEE);  Surgeon: Wonda Olds, MD;  Location: [place];  Service: Open Heart Surgery;  Laterality: N/A;   TONSILLECTOMY AND ADENOIDECTOMY  1960   TOTAL KNEE ARTHROPLASTY Left 01/14/2004    Allergies  Allergies  Allergen Reactions   Codeine Nausea Only   Levaquin [Levofloxacin]     Can take it but feels achy    History of Present Illness    Brian Mccullough  is a 71  year old male with the above mention past medical history who presents today for follow-up of coronary artery disease.  He was initially seen by Dr.Nishan in 2014 for complaint of palpitations.  He is currently followed by Dr. Burt Knack.  He underwent CABG x6 in 07/2020 due to inferior STEMI.  He developed SVT/atrial fibrillation postoperatively was treated with amiodarone and beta-blocker.  2D echo was completed that showed EF 55-60, no RWMA, GLS -18.5, normal RVSF, mild LAE, trivial MR. He reported stable myalgias on Lipitor and also had complaints of palpitations that are currently managed with metoprolol.  He was last seen by Robbie Lis, PA on 01/2022 for follow-up.  During his visit he denied any angina and blood pressure was initially elevated and improved slightly to 140/84.  He was advised to keep a log of pressures.  Brian Mccullough presents today for 35-monthfollow-up alone.  Since last being seen in the office patient reports that he has been doing well however does report an episode of chest pain that occurred last night following a meal that was similar to his anginal equivalent prior to his MI.  Denied any associated symptoms such as dizziness or shortness of breath.  He states that this immediately resolved following occurrence with no further recurrence.  He is currently compliant with his medications and denies any adverse reactions or side effects. His blood pressure today was well controlled at 124/76 and heart rate was 67 bpm.  He denies any recurrence of palpitations and he is currently tolerating his atorvastatin however he is now taking his medication Mondays and Thursdays only.  Patient denies chest pain, palpitations, dyspnea, PND, orthopnea, nausea, vomiting, dizziness, syncope, edema, weight gain, or early satiety.   Home Medications    Current Outpatient Medications  Medication Sig Dispense Refill   acetaminophen (TYLENOL) 500 MG tablet Take 1,000 mg by mouth every 6 (six) hours as needed  for headache or moderate pain.     aspirin 81 MG EC tablet Take 81 mg by mouth daily.     atorvastatin (LIPITOR) 40 MG tablet Take 1 tablet (40 mg total) by mouth daily. 90 tablet 3   benazepril (LOTENSIN) 20 MG tablet Take 1 tablet (20 mg total) by mouth daily. 90 tablet 3   cholecalciferol (VITAMIN D3) 25 MCG (1000 UNIT) tablet Take 1,000 Units by mouth daily.     cyanocobalamin 1000 MCG tablet Take 1,000 mcg by mouth daily.     empagliflozin (JARDIANCE) 10 MG TABS tablet Take 1 tablet (10 mg total) by mouth daily. 30 tablet 11   furosemide (LASIX) 20 MG tablet TAKE 1 TABLET BY MOUTH AS NEEDED FOR FLUID OR EDEMA. MAX 1 TABLET PER DAY. 100 tablet 2   loratadine (CLARITIN) 10 MG tablet Take 10 mg by mouth daily as needed for allergies or rhinitis.     meloxicam (MOBIC) 15 MG tablet TAKE 1 TABLET BY MOUTH DAILY AS  NEEDED  FOR PAIN 90 tablet 3   metFORMIN (GLUCOPHAGE) 500 MG tablet Take 1 tablet (500 mg total) by mouth daily with breakfast. 90 tablet 3   metoprolol tartrate (LOPRESSOR) 25 MG tablet Take 0.5 tablets (12.5 mg total) by mouth 2 (two) times daily. 90 tablet 3   Multiple Vitamin (MULTIVITAMIN WITH MINERALS) TABS tablet Take 1 tablet by mouth daily. (Patient taking differently: Take 1 tablet by mouth once a week.) 30 tablet 0   polyethylene glycol-electrolytes (NULYTELY) 420 g solution Take 4,000 mLs by mouth once.     tiZANidine (ZANAFLEX) 4 MG tablet Take 1 tablet (4 mg total) by mouth every 8 (eight) hours as needed for muscle spasms. 90 tablet 0   traMADol (ULTRAM) 50 MG tablet TAKE 1-2 TABLETS (50-100 MG TOTAL) BY MOUTH EVERY 6 (SIX) HOURS AS NEEDED FOR MODERATE PAIN OR SEVERE PAIN. 120 tablet 3   traZODone (DESYREL) 50 MG tablet TAKE 1/2 TO 1 TABLET BY  MOUTH AT BEDTIME AS NEEDED  FOR SLEEP 90 tablet 3   triamcinolone ointment (KENALOG) 0.1 % Apply 1 application. topically 2 (two) times daily as needed (rash). 90 g 3   No current facility-administered medications for this visit.      Review of Systems  Please see the history of present illness.    (+) Chest pressure  All other systems reviewed and are otherwise negative except as noted above.  Physical Exam    Wt Readings from Last 3 Encounters:  08/07/22 259 lb 12.8 oz (117.8 kg)  03/27/22 238 lb (108 kg)  02/02/22 244 lb (110.7 kg)   XK:GYJEH were no vitals filed for this visit.,There is no height or weight on file to calculate BMI.  Constitutional:      Appearance: Healthy appearance. Not in distress.  Neck:     Vascular: JVD normal.  Pulmonary:     Effort: Pulmonary effort is normal.     Breath sounds: No wheezing. No rales. Diminished in the bases Cardiovascular:     Normal rate. Regular rhythm. Normal S1. Normal S2.      Murmurs: There is no murmur.  Edema:    Peripheral edema absent.  Abdominal:     Palpations: Abdomen is soft non tender. There is no hepatomegaly.  Skin:    General: Skin is warm and dry.  Neurological:     General: No focal deficit present.     Mental Status: Alert and oriented to person, place and time.     Cranial Nerves: Cranial nerves are intact.  EKG/LABS/Other Studies Reviewed    ECG personally reviewed by me today -normal sinus rhythm with left anterior fascicular block and rate of 67 bpm no acute changes consistent with previous EKG  Lab Results  Component Value Date   WBC 12.2 (H) 12/01/2021   HGB 12.2 (L) 12/01/2021   HCT 36.6 (L) 12/01/2021   MCV 87.1 12/01/2021   PLT 173 12/01/2021   Lab Results  Component Value Date   CREATININE 1.27 08/07/2022   BUN 12 08/07/2022   NA 139 08/07/2022   K 4.1 08/07/2022   CL 106 08/07/2022   CO2 26 08/07/2022   Lab Results  Component Value Date   ALT 8 08/07/2022   AST 12 08/07/2022   ALKPHOS 60 08/07/2022   BILITOT 0.5 08/07/2022   Lab Results  Component Value Date   CHOL 131 06/20/2021   HDL 38 (L) 06/20/2021   LDLCALC 69 06/20/2021   LDLDIRECT 88.1 01/23/2011   TRIG 135  06/20/2021   CHOLHDL 3.4  06/20/2021    Lab Results  Component Value Date   HGBA1C 6.1 08/07/2022    Assessment & Plan    1.  Coronary artery disease: -s/p CABG x6 in 2021 and today reports a bout of chest pressure that occurred following a meal that was equivalent to his previous angina prior to MI. -We will have patient complete ETT to rule out possible ischemia related to chest pain -We will add as needed Nitrostat 0.4 mg -Continue current GDMT with ASA 81 mg, Lipitor 40 mg Jardiance 10 mg, metoprolol 12.5 mg twice daily  2.  Essential hypertension: -Patient's blood pressure today was well controlled today at 124/76 -Continue current antihypertensive regimen  3.  Carotid artery disease: -Carotid Dopplers completed in 07/2020 showing right 1-39%, left 40-59% -Patient will need updated carotids next year.  4.  DM type II: -Patient's last hemoglobin A1c was 6.1 08/07/22 -He was recently started on Jardiance by PCP  5.  Hyperlipidemia: -Patient has history of myalgias and reports that he is now taking his medications Monday and Thursday -We will check lipids and LFTs next week -Continue Lipitor 40 mg   Disposition: Follow-up with Sherren Mocha, MD or APP in 1 months Shared Decision Making/Informed Consent The risks [chest pain, shortness of breath, cardiac arrhythmias, dizziness, blood pressure fluctuations, myocardial infarction, stroke/transient ischemic attack, and life-threatening complications (estimated to be 1 in 10,000)], benefits (risk stratification, diagnosing coronary artery disease, treatment guidance) and alternatives of an exercise tolerance test were discussed in detail with Brian Mccullough and he agrees to proceed.   Medication Adjustments/Labs and Tests Ordered: Current medicines are reviewed at length with the patient today.  Concerns regarding medicines are outlined above.   Signed, Mable Fill, Marissa Nestle, NP 08/15/2022, 7:10 AM [facility]  Note:  This  document was prepared using Dragon voice recognition software and may include unintentional dictation errors.

## 2022-08-17 ENCOUNTER — Ambulatory Visit: Payer: Medicare Other | Attending: Nurse Practitioner | Admitting: Nurse Practitioner

## 2022-08-17 ENCOUNTER — Encounter: Payer: Self-pay | Admitting: Nurse Practitioner

## 2022-08-17 VITALS — BP 124/76 | HR 67 | Ht 75.0 in | Wt 253.8 lb

## 2022-08-17 DIAGNOSIS — E1165 Type 2 diabetes mellitus with hyperglycemia: Secondary | ICD-10-CM

## 2022-08-17 DIAGNOSIS — I25110 Atherosclerotic heart disease of native coronary artery with unstable angina pectoris: Secondary | ICD-10-CM | POA: Diagnosis not present

## 2022-08-17 DIAGNOSIS — R0789 Other chest pain: Secondary | ICD-10-CM

## 2022-08-17 DIAGNOSIS — I10 Essential (primary) hypertension: Secondary | ICD-10-CM | POA: Diagnosis not present

## 2022-08-17 DIAGNOSIS — E782 Mixed hyperlipidemia: Secondary | ICD-10-CM | POA: Diagnosis not present

## 2022-08-17 MED ORDER — NITROGLYCERIN 0.4 MG SL SUBL: 0.4000 mg | SUBLINGUAL_TABLET | SUBLINGUAL | 3 refills | Status: DC | PRN

## 2022-08-17 MED ORDER — ATORVASTATIN CALCIUM 40 MG PO TABS: 40.0000 mg | ORAL_TABLET | Freq: Every day | ORAL | 3 refills | Status: DC

## 2022-08-17 MED ORDER — METOPROLOL TARTRATE 25 MG PO TABS: 12.5000 mg | ORAL_TABLET | Freq: Every day | ORAL | 0 refills | Status: DC

## 2022-08-17 NOTE — Patient Instructions (Addendum)
Medication Instructions:  START Nitroglycern 0.'4mg'$  SL tablet take 1 tablet as needed for chest pain for first dose; wait 5 minutes and contact 911 before taking a second dose; wait 5 more minutes before taking final dose. DO NOT TAKE MORE THAN 3 DOSES. Metoprolol instructions have changed you can take half tablet daily and extra tablet as needed. *If you need a refill on your cardiac medications before your next appointment, please call your pharmacy*   Lab Work: Your physician recommends that you return for lab work in: [place] & LFT on the same day as stress test If you have labs (blood work) drawn today and your tests are completely normal, you will receive your results only by: [place] (if you have MyChart) OR A paper copy in the mail If you have any lab test that is abnormal or we need to change your treatment, we will call you to review the results.   Testing/Procedures: Your physician has requested that you have an exercise tolerance test. For further information please visit [email or web address]. Please also follow instruction sheet, as given.    Follow-Up: At [facility], you and your health needs are our priority.  As part of our continuing mission to provide you with exceptional heart care, we have created designated Provider Care Teams.  These Care Teams include your primary Cardiologist (physician) and Advanced Practice Providers (APPs -  Physician Assistants and Nurse Practitioners) who all work together to provide you with the care you need, when you need it.  We recommend signing up for the patient portal called "MyChart".  Sign up information is provided on this After Visit Summary.  MyChart is used to connect with patients for Virtual Visits (Telemedicine).  Patients are able to view lab/test results, encounter notes, upcoming appointments, etc.  Non-urgent messages can be sent to your provider as well.   To learn more about what you can do with  MyChart, go to [email or web address].    Your next appointment:   1 month(s)  The format for your next appointment:   In Person  Provider:   Ambrose Pancoast, NP        Other Instructions   Important Information About Sugar

## 2022-08-23 ENCOUNTER — Ambulatory Visit: Payer: Medicare Other | Attending: Nurse Practitioner

## 2022-08-23 DIAGNOSIS — E782 Mixed hyperlipidemia: Secondary | ICD-10-CM

## 2022-08-23 DIAGNOSIS — R0789 Other chest pain: Secondary | ICD-10-CM

## 2022-08-23 DIAGNOSIS — I25110 Atherosclerotic heart disease of native coronary artery with unstable angina pectoris: Secondary | ICD-10-CM

## 2022-08-23 LAB — EXERCISE TOLERANCE TEST
Angina Index: 0
Duke Treadmill Score: 5
Estimated workload: 7
Exercise duration (min): 5 min
Exercise duration (sec): 0 s
MPHR: 150 {beats}/min
Peak HR: 142 {beats}/min
Percent HR: 94 %
RPE: 15
Rest HR: 67 {beats}/min
ST Depression (mm): 0 mm

## 2022-08-23 LAB — HEPATIC FUNCTION PANEL
ALT: 10 IU/L (ref 0–44)
AST: 15 IU/L (ref 0–40)
Albumin: 4.2 g/dL (ref 3.9–4.9)
Alkaline Phosphatase: 65 IU/L (ref 44–121)
Bilirubin Total: 0.5 mg/dL (ref 0.0–1.2)
Bilirubin, Direct: 0.13 mg/dL (ref 0.00–0.40)
Total Protein: 6.6 g/dL (ref 6.0–8.5)

## 2022-08-23 LAB — LIPID PANEL
Chol/HDL Ratio: 3.8 ratio (ref 0.0–5.0)
Cholesterol, Total: 137 mg/dL (ref 100–199)
HDL: 36 mg/dL — ABNORMAL LOW (ref >39)
LDL Chol Calc (NIH): 85 mg/dL (ref 0–99)
Triglycerides: 85 mg/dL (ref 0–149)
VLDL Cholesterol Cal: 16 mg/dL (ref 5–40)

## 2022-08-24 ENCOUNTER — Other Ambulatory Visit: Payer: Self-pay

## 2022-08-24 MED ORDER — EZETIMIBE 10 MG PO TABS: 10.0000 mg | ORAL_TABLET | Freq: Every day | ORAL | 1 refills | Status: DC

## 2022-08-28 ENCOUNTER — Ambulatory Visit: Payer: Medicare Other | Admitting: Internal Medicine

## 2022-09-15 NOTE — Progress Notes (Unsigned)
Office Visit    Patient Name: Brian Mccullough Date of Encounter: 09/15/2022  Primary Care Provider:  Cassandria Anger, MD Primary Cardiologist:  Sherren Mocha, MD Primary Electrophysiologist: None  Chief Complaint    Brian Mccullough is a 71 y.o. male with PMH of CAD s/p inferior STEMI 2021 with angioplasty to RCA and CABG x6, carotid artery disease (right 1-39%, left 40-59%), HTN, HLD, DM type II, SVT who presents today for 1 month follow-up of coronary artery disease.   Past Medical History    Past Medical History:  Diagnosis Date   Allergy    CAD (coronary artery disease)    s/p Inf STEMI 10/21>>POBA to RCA >> s/p CABG (L-LAD, S-PDA, L Radial-OM1/RI, RIMA-RPLA) // post op AF; SVT >> Amiod Rx // Echo 10/21: EF 55-60, no RWMA, GLS -18.5, normal RVSF, mild LAE, trivial MR    Carotid artery disease ([place])    [place] 10/21: R 1-39; L 40-59   History of kidney stones    Hyperactive gag reflex    Hyperlipidemia    Hypertension    under control with meds., has been on med. x 30 yr.   Inguinal hernia 01/2014   bilateral   LBP (low back pain)    Non-insulin dependent type 2 diabetes mellitus (HCC)    OA (osteoarthritis) of knee 01/2004   left   Palpitations    s/p Inf STEMI 07/2020   POBA to RCA >> CABG   STEMI involving right coronary artery ([place]) 08/03/2020   Vitamin B 12 deficiency    Past Surgical History:  Procedure Laterality Date   CARDIAC CATHETERIZATION     CORONARY ARTERY BYPASS GRAFT N/A 08/04/2020   Procedure: CORONARY ARTERY BYPASS GRAFTING (CABG), ON PUMP, TIMES SIX, USING BILATERAL INTERNAL MAMMARY ARTERIES, RIGHT ENDOSCOPICALLY HARVESTED GREATER SAPHENOUS VEIN, AND RIGHT RADIAL ARTERY OPEN HARVEST;  Surgeon: Wonda Olds, MD;  Location: [place];  Service: Open Heart Surgery;  Laterality: N/A;   CORONARY/GRAFT ACUTE MI REVASCULARIZATION N/A 08/03/2020   Procedure: Coronary/Graft Acute MI Revascularization;  Surgeon: Sherren Mocha, MD;  Location: [place] [facility];  Service: Cardiovascular;  Laterality: N/A;   ENDOVEIN HARVEST OF GREATER SAPHENOUS VEIN Right 08/04/2020   Procedure: ENDOVEIN HARVEST OF GREATER SAPHENOUS VEIN;  Surgeon: Wonda Olds, MD;  Location: [place];  Service: Open Heart Surgery;  Laterality: Right;   INGUINAL HERNIA REPAIR  age [age]   INGUINAL HERNIA REPAIR Bilateral 02/09/2014   Procedure: OPEN BILATERAL INGUINAL HERNIA REPAIR WITH MESH;  Surgeon: [name], MD;  Location: [place];  Service: General;  Laterality: Bilateral;   INSERTION OF MESH Bilateral 02/09/2014   Procedure: INSERTION OF MESH;  Surgeon: [name], MD;  Location: [place];  Service: General;  Laterality: Bilateral;   KNEE ARTHROSCOPY Right    LEFT HEART CATH AND CORONARY ANGIOGRAPHY N/A 08/03/2020   Procedure: LEFT HEART CATH AND CORONARY ANGIOGRAPHY;  Surgeon: Sherren Mocha, MD;  Location: [place] [facility];  Service: Cardiovascular;  Laterality: N/A;   ORCHIECTOMY Left 05/31/2017   Procedure: LEFT RADICAL ORCHIECTOMY;  Surgeon: Irine Seal, MD;  Location: [facility];  Service: Urology;  Laterality: Left;   PROCTOSCOPY N/A 11/30/2021   Procedure: RIGID PROCTOSCOPY;  Surgeon: Michael Boston, MD;  Location: [facility];  Service: General;  Laterality: N/A;   RADIAL ARTERY HARVEST Right 08/04/2020   Procedure: RADIAL ARTERY HARVEST;  Surgeon: Wonda Olds, MD;  [facility]: [facility];  Service: Open Heart Surgery;  Laterality: Right;   TEE WITHOUT CARDIOVERSION N/A 08/04/2020   Procedure: TRANSESOPHAGEAL ECHOCARDIOGRAM (TEE);  Surgeon: Wonda Olds, MD;  Location: [place];  Service: Open Heart Surgery;  Laterality: N/A;   TONSILLECTOMY AND ADENOIDECTOMY  1960   TOTAL KNEE ARTHROPLASTY Left 01/14/2004    Allergies  Allergies  Allergen Reactions   Codeine Nausea Only   Levaquin [Levofloxacin]     Can take it but feels achy    History of Present Illness    Brian Mccullough  is a 71  year old male with the above mention past medical history who presents today for follow-up of coronary artery disease.  He was initially seen by Dr.Nishan in 2014 for complaint of palpitations.  He is currently followed by Dr. Burt Knack.  He underwent CABG x6 in 07/2020 due to inferior STEMI.  He developed SVT/atrial fibrillation postoperatively was treated with amiodarone and beta-blocker.  2D echo was completed that showed EF 55-60, no RWMA, GLS -18.5, normal RVSF, mild LAE, trivial MR. He reported stable myalgias on Lipitor and also had complaints of palpitations that are currently managed with metoprolol.  He was last seen by Brian Lis, PA on 01/2022 for follow-up.  During his visit he denied any angina and blood pressure was initially elevated and improved slightly to 140/84.  He was advised to keep a log of pressures.  Brian Mccullough was seen in follow-up on 08/17/2022 for follow-up.  During his visit he reported an isolated episode of chest discomfort that was similar to his anginal equivalent with his MI.  He was sent for ETT to rule out possible ischemia and Nitrostat was also added to his medications.  ETT results showed normal heart rate and blood pressure response to exercise with no evidence of ischemia.  Since last being seen in the office patient reports***.  Patient denies chest pain, palpitations, dyspnea, PND, orthopnea, nausea, vomiting, dizziness, syncope, edema, weight gain, or early satiety.  ***Notes:  Home Medications    Current Outpatient Medications  Medication Sig Dispense Refill   ezetimibe (ZETIA) 10 MG tablet Take 1 tablet (10 mg total) by mouth daily. 90 tablet 1   acetaminophen (TYLENOL) 500 MG tablet Take 1,000 mg by mouth every 6 (six) hours as needed for headache or moderate pain.     aspirin 81 MG EC tablet Take 81 mg by mouth daily.     atorvastatin (LIPITOR) 40 MG tablet Take 1 tablet (40 mg total) by mouth daily at 12 noon. Take 1 tablet on Mondays and Thursdays only  (patient preference) 90 tablet 3   benazepril (LOTENSIN) 20 MG tablet Take 1 tablet (20 mg total) by mouth daily. 90 tablet 3   cholecalciferol (VITAMIN D3) 25 MCG (1000 UNIT) tablet Take 1,000 Units by mouth daily.     cyanocobalamin 1000 MCG tablet Take 1,000 mcg by mouth daily.     empagliflozin (JARDIANCE) 10 MG TABS tablet Take 1 tablet (10 mg total) by mouth daily. 30 tablet 11   furosemide (LASIX) 20 MG tablet TAKE 1 TABLET BY MOUTH AS NEEDED FOR FLUID OR EDEMA. MAX 1 TABLET PER DAY. 100 tablet 2   loratadine (CLARITIN) 10 MG tablet Take 10 mg by mouth daily as needed for allergies or rhinitis.     meloxicam (MOBIC) 15 MG tablet TAKE 1 TABLET BY MOUTH DAILY AS  NEEDED FOR PAIN 90 tablet 3   metFORMIN (GLUCOPHAGE) 500 MG tablet Take 1 tablet (500 mg  total) by mouth daily with breakfast. 90 tablet 3   metoprolol tartrate (LOPRESSOR) 25 MG tablet Take 0.5 tablets (12.5 mg total) by mouth daily. Can take an additional half tablet daily as needed 30 tablet 0   Multiple Vitamin (MULTIVITAMIN WITH MINERALS) TABS tablet Take 1 tablet by mouth daily. (Patient taking differently: Take 1 tablet by mouth once a week.) 30 tablet 0   nitroGLYCERIN (NITROSTAT) 0.4 MG SL tablet Place 1 tablet (0.4 mg total) under the tongue every 5 (five) minutes as needed for chest pain. 30 tablet 3   polyethylene glycol-electrolytes (NULYTELY) 420 g solution Take 4,000 mLs by mouth once.     tiZANidine (ZANAFLEX) 4 MG tablet Take 1 tablet (4 mg total) by mouth every 8 (eight) hours as needed for muscle spasms. 90 tablet 0   traMADol (ULTRAM) 50 MG tablet TAKE 1-2 TABLETS (50-100 MG TOTAL) BY MOUTH EVERY 6 (SIX) HOURS AS NEEDED FOR MODERATE PAIN OR SEVERE PAIN. 120 tablet 3   traZODone (DESYREL) 50 MG tablet TAKE 1/2 TO 1 TABLET BY  MOUTH AT BEDTIME AS NEEDED  FOR SLEEP 90 tablet 3   triamcinolone ointment (KENALOG) 0.1 % Apply 1 application. topically 2 (two) times daily as needed (rash). 90 g 3   No current  facility-administered medications for this visit.     Review of Systems  Please see the history of present illness.    (+)*** (+)***  All other systems reviewed and are otherwise negative except as noted above.  Physical Exam    Wt Readings from Last 3 Encounters:  08/17/22 253 lb 12.8 oz (115.1 kg)  08/07/22 259 lb 12.8 oz (117.8 kg)  03/27/22 238 lb (108 kg)   YH:CWCBJ were no vitals filed for this visit.,There is no height or weight on file to calculate BMI.  Constitutional:      Appearance: Healthy appearance. Not in distress.  Neck:     Vascular: JVD normal.  Pulmonary:     Effort: Pulmonary effort is normal.     Breath sounds: No wheezing. No rales. Diminished in the bases Cardiovascular:     Normal rate. Regular rhythm. Normal S1. Normal S2.      Murmurs: There is no murmur.  Edema:    Peripheral edema absent.  Abdominal:     Palpations: Abdomen is soft non tender. There is no hepatomegaly.  Skin:    General: Skin is warm and dry.  Neurological:     General: No focal deficit present.     Mental Status: Alert and oriented to person, place and time.     Cranial Nerves: Cranial nerves are intact.  EKG/LABS/Other Studies Reviewed    ECG personally reviewed by me today - ***  Risk Assessment/Calculations:   {Does this patient have ATRIAL FIBRILLATION?:[phone]}        Lab Results  Component Value Date   WBC 12.2 (H) 12/01/2021   HGB 12.2 (L) 12/01/2021   HCT 36.6 (L) 12/01/2021   MCV 87.1 12/01/2021   PLT 173 12/01/2021   Lab Results  Component Value Date   CREATININE 1.27 08/07/2022   BUN 12 08/07/2022   NA 139 08/07/2022   K 4.1 08/07/2022   CL 106 08/07/2022   CO2 26 08/07/2022   Lab Results  Component Value Date   ALT 10 08/23/2022   AST 15 08/23/2022   ALKPHOS 65 08/23/2022   BILITOT 0.5 08/23/2022   Lab Results  Component Value Date   CHOL 137 08/23/2022  HDL 36 (L) 08/23/2022   LDLCALC 85 08/23/2022   LDLDIRECT 88.1 01/23/2011    TRIG 85 08/23/2022   CHOLHDL 3.8 08/23/2022    Lab Results  Component Value Date   HGBA1C 6.1 08/07/2022    Assessment & Plan     1.  Coronary artery disease: -s/p CABG x6 in 2021 and today reports a bout of chest pressure that occurred following a meal that was equivalent to his previous angina prior to MI. -We will have patient complete ETT to rule out possible ischemia related to chest pain -We will add as needed Nitrostat 0.4 mg -Continue current GDMT with ASA 81 mg, Lipitor 40 mg Jardiance 10 mg, metoprolol 12.5 mg twice daily   2.  Essential hypertension: -Patient's blood pressure today was well controlled today at 124/76 -Continue current antihypertensive regimen   3.  Carotid artery disease: -Carotid Dopplers completed in 07/2020 showing right 1-39%, left 40-59% -Patient will need updated carotids next year.   4.  DM type II: -Patient's last hemoglobin A1c was 6.1 08/07/22 -He was recently started on Jardiance by PCP   5.  Hyperlipidemia: -Patient has history of myalgias and reports that he is now taking his medications Monday and Thursday -We will check lipids and LFTs next week -Continue Lipitor 40 mg        Disposition: Follow-up with Sherren Mocha, MD or APP in *** months {Are you ordering a CV Procedure (e.g. stress test, cath, DCCV, TEE, etc)?   Press F2        :[id number]}   Medication Adjustments/Labs and Tests Ordered: Current medicines are reviewed at length with the patient today.  Concerns regarding medicines are outlined above.   Signed, Mable Fill, Marissa Nestle, NP 09/15/2022, 1:25 PM [facility]  Note:  This document was prepared using Dragon voice recognition software and may include unintentional dictation errors.

## 2022-09-17 ENCOUNTER — Other Ambulatory Visit: Payer: Self-pay | Admitting: Internal Medicine

## 2022-09-19 ENCOUNTER — Encounter: Payer: Self-pay | Admitting: Nurse Practitioner

## 2022-09-19 ENCOUNTER — Ambulatory Visit: Payer: Medicare Other | Attending: Nurse Practitioner | Admitting: Nurse Practitioner

## 2022-09-19 VITALS — BP 110/58 | HR 60 | Ht 75.0 in | Wt 255.0 lb

## 2022-09-19 DIAGNOSIS — I25110 Atherosclerotic heart disease of native coronary artery with unstable angina pectoris: Secondary | ICD-10-CM | POA: Diagnosis not present

## 2022-09-19 DIAGNOSIS — I6523 Occlusion and stenosis of bilateral carotid arteries: Secondary | ICD-10-CM

## 2022-09-19 DIAGNOSIS — I10 Essential (primary) hypertension: Secondary | ICD-10-CM

## 2022-09-19 DIAGNOSIS — E785 Hyperlipidemia, unspecified: Secondary | ICD-10-CM

## 2022-09-19 DIAGNOSIS — E1165 Type 2 diabetes mellitus with hyperglycemia: Secondary | ICD-10-CM | POA: Diagnosis not present

## 2022-09-19 NOTE — Patient Instructions (Signed)
Medication Instructions:  Your physician recommends that you continue on your current medications as directed. Please refer to the Current Medication list given to you today. *If you need a refill on your cardiac medications before your next appointment, please call your pharmacy*   Lab Work: None Ordered   Testing/Procedures: None ordered   Follow-Up: At [facility], you and your health needs are our priority.  As part of our continuing mission to provide you with exceptional heart care, we have created designated Provider Care Teams.  These Care Teams include your primary Cardiologist (physician) and Advanced Practice Providers (APPs -  Physician Assistants and Nurse Practitioners) who all work together to provide you with the care you need, when you need it.  We recommend signing up for the patient portal called "MyChart".  Sign up information is provided on this After Visit Summary.  MyChart is used to connect with patients for Virtual Visits (Telemedicine).  Patients are able to view lab/test results, encounter notes, upcoming appointments, etc.  Non-urgent messages can be sent to your provider as well.   To learn more about what you can do with MyChart, go to [email or web address].    Your next appointment:   6 month(s)  The format for your next appointment:   In Person  Provider:   Sherren Mocha, MD   or Ambrose Pancoast, NP  Other Instructions   Important Information About Sugar

## 2022-10-03 ENCOUNTER — Ambulatory Visit: Payer: Medicare Other

## 2022-10-25 ENCOUNTER — Ambulatory Visit (INDEPENDENT_AMBULATORY_CARE_PROVIDER_SITE_OTHER): Payer: Medicare Other

## 2022-10-25 ENCOUNTER — Other Ambulatory Visit: Payer: Self-pay | Admitting: Internal Medicine

## 2022-10-25 VITALS — Ht 75.0 in | Wt 240.0 lb

## 2022-10-25 DIAGNOSIS — Z0000 Encounter for general adult medical examination without abnormal findings: Secondary | ICD-10-CM | POA: Diagnosis not present

## 2022-10-25 NOTE — Patient Instructions (Signed)
Brian Mccullough , Thank you for taking time to come for your Medicare Wellness Visit. I appreciate your ongoing commitment to your health goals. Please review the following plan we discussed and let me know if I can assist you in the future.   These are the goals we discussed:  Goals      My goal for 2024 is to ose 30 pounds by staying active and maintaining my health.     Set My Target A1C-Diabetes Type 2     Timeframe:  Long-Range Goal Priority:  High Start Date: 10/25/2022                            Expected End Date:  04/14/2023                   Follow Up Date: 04/14/2023   - set target A1C - keep A1c <7.0%    Why is this important?   Your target A1C is decided together by you and your doctor.  It is based on several things like your age and other health issues.         This is a list of the screening recommended for you and due dates:  Health Maintenance  Topic Date Due   Complete foot exam   Never done   Yearly kidney health urinalysis for diabetes  03/29/2022   COVID-19 Vaccine (5 - 2023-24 season) 06/09/2022   Hemoglobin A1C  02/06/2023   Eye exam for diabetics  06/28/2023   Yearly kidney function blood test for diabetes  08/08/2023   Medicare Annual Wellness Visit  10/26/2023   Colon Cancer Screening  11/29/2028   DTaP/Tdap/Td vaccine (3 - Td or Tdap) 07/30/2030   Pneumonia Vaccine  Completed   Flu Shot  Completed   Hepatitis C Screening: USPSTF Recommendation to screen - Ages 77-79 yo.  Completed   HPV Vaccine  Aged Out   Zoster (Shingles) Vaccine  Discontinued    Advanced directives: No  Conditions/risks identified: Yes; Type II Diabetes Mellitus   Next appointment: Follow up in one year for your annual wellness visit.   Preventive Care 23 Years and Older, Male  Preventive care refers to lifestyle choices and visits with your health care provider that can promote health and wellness. What does preventive care include? A yearly physical exam. This is also called  an annual well check. Dental exams once or twice a year. Routine eye exams. Ask your health care provider how often you should have your eyes checked. Personal lifestyle choices, including: Daily care of your teeth and gums. Regular physical activity. Eating a healthy diet. Avoiding tobacco and drug use. Limiting alcohol use. Practicing safe sex. Taking low doses of aspirin every day. Taking vitamin and mineral supplements as recommended by your health care provider. What happens during an annual well check? The services and screenings done by your health care provider during your annual well check will depend on your age, overall health, lifestyle risk factors, and family history of disease. Counseling  Your health care provider may ask you questions about your: Alcohol use. Tobacco use. Drug use. Emotional well-being. Home and relationship well-being. Sexual activity. Eating habits. History of falls. Memory and ability to understand (cognition). Work and work Statistician. Screening  You may have the following tests or measurements: Height, weight, and BMI. Blood pressure. Lipid and cholesterol levels. These may be checked every 5 years, or more frequently if you are  over 65 years old. Skin check. Lung cancer screening. You may have this screening every year starting at age 52 if you have a 30-pack-year history of smoking and currently smoke or have quit within the past 15 years. Fecal occult blood test (FOBT) of the stool. You may have this test every year starting at age 3. Flexible sigmoidoscopy or colonoscopy. You may have a sigmoidoscopy every 5 years or a colonoscopy every 10 years starting at age 65. Prostate cancer screening. Recommendations will vary depending on your family history and other risks. Hepatitis C blood test. Hepatitis B blood test. Sexually transmitted disease (STD) testing. Diabetes screening. This is done by checking your blood sugar (glucose) after  you have not eaten for a while (fasting). You may have this done every 1-3 years. Abdominal aortic aneurysm (AAA) screening. You may need this if you are a current or former smoker. Osteoporosis. You may be screened starting at age 50 if you are at high risk. Talk with your health care provider about your test results, treatment options, and if necessary, the need for more tests. Vaccines  Your health care provider may recommend certain vaccines, such as: Influenza vaccine. This is recommended every year. Tetanus, diphtheria, and acellular pertussis (Tdap, Td) vaccine. You may need a Td booster every 10 years. Zoster vaccine. You may need this after age 32. Pneumococcal 13-valent conjugate (PCV13) vaccine. One dose is recommended after age 28. Pneumococcal polysaccharide (PPSV23) vaccine. One dose is recommended after age 56. Talk to your health care provider about which screenings and vaccines you need and how often you need them. This information is not intended to replace advice given to you by your health care provider. Make sure you discuss any questions you have with your health care provider. Document Released: 10/22/2015 Document Revised: 06/14/2016 Document Reviewed: 07/27/2015 Elsevier Interactive Patient Education  2017 [place] Prevention in the Home Falls can cause injuries. They can happen to people of all ages. There are many things you can do to make your home safe and to help prevent falls. What can I do on the outside of my home? Regularly fix the edges of walkways and driveways and fix any cracks. Remove anything that might make you trip as you walk through a door, such as a raised step or threshold. Trim any bushes or trees on the path to your home. Use bright outdoor lighting. Clear any walking paths of anything that might make someone trip, such as rocks or tools. Regularly check to see if handrails are loose or broken. Make sure that both sides of any steps  have handrails. Any raised decks and porches should have guardrails on the edges. Have any leaves, snow, or ice cleared regularly. Use sand or salt on walking paths during winter. Clean up any spills in your garage right away. This includes oil or grease spills. What can I do in the bathroom? Use night lights. Install grab bars by the toilet and in the tub and shower. Do not use towel bars as grab bars. Use non-skid mats or decals in the tub or shower. If you need to sit down in the shower, use a plastic, non-slip stool. Keep the floor dry. Clean up any water that spills on the floor as soon as it happens. Remove soap buildup in the tub or shower regularly. Attach bath mats securely with double-sided non-slip rug tape. Do not have throw rugs and other things on the floor that can make you trip. What  can I do in the bedroom? Use night lights. Make sure that you have a light by your bed that is easy to reach. Do not use any sheets or blankets that are too big for your bed. They should not hang down onto the floor. Have a firm chair that has side arms. You can use this for support while you get dressed. Do not have throw rugs and other things on the floor that can make you trip. What can I do in the kitchen? Clean up any spills right away. Avoid walking on wet floors. Keep items that you use a lot in easy-to-reach places. If you need to reach something above you, use a strong step stool that has a grab bar. Keep electrical cords out of the way. Do not use floor polish or wax that makes floors slippery. If you must use wax, use non-skid floor wax. Do not have throw rugs and other things on the floor that can make you trip. What can I do with my stairs? Do not leave any items on the stairs. Make sure that there are handrails on both sides of the stairs and use them. Fix handrails that are broken or loose. Make sure that handrails are as long as the stairways. Check any carpeting to make  sure that it is firmly attached to the stairs. Fix any carpet that is loose or worn. Avoid having throw rugs at the top or bottom of the stairs. If you do have throw rugs, attach them to the floor with carpet tape. Make sure that you have a light switch at the top of the stairs and the bottom of the stairs. If you do not have them, ask someone to add them for you. What else can I do to help prevent falls? Wear shoes that: Do not have high heels. Have rubber bottoms. Are comfortable and fit you well. Are closed at the toe. Do not wear sandals. If you use a stepladder: Make sure that it is fully opened. Do not climb a closed stepladder. Make sure that both sides of the stepladder are locked into place. Ask someone to hold it for you, if possible. Clearly mark and make sure that you can see: Any grab bars or handrails. First and last steps. Where the edge of each step is. Use tools that help you move around (mobility aids) if they are needed. These include: Canes. Walkers. Scooters. Crutches. Turn on the lights when you go into a dark area. Replace any light bulbs as soon as they burn out. Set up your furniture so you have a clear path. Avoid moving your furniture around. If any of your floors are uneven, fix them. If there are any pets around you, be aware of where they are. Review your medicines with your doctor. Some medicines can make you feel dizzy. This can increase your chance of falling. Ask your doctor what other things that you can do to help prevent falls. This information is not intended to replace advice given to you by your health care provider. Make sure you discuss any questions you have with your health care provider. Document Released: 07/22/2009 Document Revised: 03/02/2016 Document Reviewed: 10/30/2014 Elsevier Interactive Patient Education  2017 [employer].

## 2022-10-25 NOTE — Progress Notes (Addendum)
Virtual Visit via Telephone Note  I connected with  Brian Mccullough on 10/25/22 at  8:45 AM EST by telephone and verified that I am speaking with the correct person using two identifiers.  Location: Patient: Home Provider: [place] Persons participating in the virtual visit: [place]   I discussed the limitations, risks, security and privacy concerns of performing an evaluation and management service by telephone and the availability of in person appointments. The patient expressed understanding and agreed to proceed.  Interactive audio and video telecommunications were attempted between this nurse and patient, however failed, due to patient having technical difficulties OR patient did not have access to video capability.  We continued and completed visit with audio only.  Some vital signs may be absent or patient reported.   Sheral Flow, LPN  Subjective:   Brian Mccullough is a 72 y.o. male who presents for Medicare Annual/Subsequent preventive examination.  Review of Systems     Cardiac Risk Factors include: advanced age (>70mn, >>25women);diabetes mellitus;family history of premature cardiovascular disease;male gender;obesity (BMI >30kg/m2);dyslipidemia;hypertension     Objective:    Today's Vitals   10/25/22 0846  Weight: 240 lb (108.9 kg)  Height: '6\' 3"'$  (1.905 m)  PainSc: 0-No pain   Body mass index is 30 kg/m.     10/25/2022    8:48 AM 11/30/2021    6:50 AM 09/27/2021    9:23 AM 08/13/2021    2:00 AM 08/12/2021   10:30 PM 08/04/2020   12:36 AM 07/30/2020   10:33 AM  Advanced Directives  Does Patient Have a Medical Advance Directive? No No No No No No No  Would patient like information on creating a medical advance directive? No - Patient declined No - Patient declined No - Patient declined  Yes (ED - Information included in AVS) No - Patient declined No - Patient declined    Current Medications (verified) Outpatient  Encounter Medications as of 10/25/2022  Medication Sig   acetaminophen (TYLENOL) 500 MG tablet Take 1,000 mg by mouth every 6 (six) hours as needed for headache or moderate pain.   aspirin 81 MG EC tablet Take 81 mg by mouth daily.   atorvastatin (LIPITOR) 40 MG tablet TAKE 1 TABLET BY MOUTH DAILY   benazepril (LOTENSIN) 20 MG tablet Take 1 tablet (20 mg total) by mouth daily.   cholecalciferol (VITAMIN D3) 25 MCG (1000 UNIT) tablet Take 1,000 Units by mouth daily.   cyanocobalamin 1000 MCG tablet Take 1,000 mcg by mouth daily.   empagliflozin (JARDIANCE) 10 MG TABS tablet Take 1 tablet (10 mg total) by mouth daily.   ezetimibe (ZETIA) 10 MG tablet Take 1 tablet (10 mg total) by mouth daily.   furosemide (LASIX) 20 MG tablet TAKE 1 TABLET BY MOUTH AS NEEDED FOR FLUID OR EDEMA. MAX 1 TABLET PER DAY.   loratadine (CLARITIN) 10 MG tablet Take 10 mg by mouth daily as needed for allergies or rhinitis.   meloxicam (MOBIC) 15 MG tablet TAKE 1 TABLET BY MOUTH DAILY AS  NEEDED FOR PAIN   metFORMIN (GLUCOPHAGE) 500 MG tablet TAKE 1 TABLET BY MOUTH DAILY  WITH BREAKFAST   metoprolol tartrate (LOPRESSOR) 25 MG tablet Take 0.5 tablets (12.5 mg total) by mouth daily. Can take an additional half tablet daily as needed   nitroGLYCERIN (NITROSTAT) 0.4 MG SL tablet Place 1 tablet (0.4 mg total) under the tongue every 5 (five) minutes as needed for chest pain.   tiZANidine (ZANAFLEX) 4 MG  tablet Take 1 tablet (4 mg total) by mouth every 8 (eight) hours as needed for muscle spasms.   traMADol (ULTRAM) 50 MG tablet TAKE 1-2 TABLETS (50-100 MG TOTAL) BY MOUTH EVERY 6 (SIX) HOURS AS NEEDED FOR MODERATE PAIN OR SEVERE PAIN.   traZODone (DESYREL) 50 MG tablet TAKE 1/2 TO 1 TABLET BY  MOUTH AT BEDTIME AS NEEDED  FOR SLEEP   triamcinolone ointment (KENALOG) 0.1 % Apply 1 application. topically 2 (two) times daily as needed (rash).   No facility-administered encounter medications on file as of 10/25/2022.    Allergies  (verified) Codeine and Levaquin [levofloxacin]   History: Past Medical History:  Diagnosis Date   Allergy    CAD (coronary artery disease)    s/p Inf STEMI 10/21>>POBA to RCA >> s/p CABG (L-LAD, S-PDA, L Radial-OM1/RI, RIMA-RPLA) // post op AF; SVT >> Amiod Rx // Echo 10/21: EF 55-60, no RWMA, GLS -18.5, normal RVSF, mild LAE, trivial MR    Carotid artery disease ([place])    [place] 10/21: R 1-39; L 40-59   History of kidney stones    Hyperactive gag reflex    Hyperlipidemia    Hypertension    under control with meds., has been on med. x 30 yr.   Inguinal hernia 01/2014   bilateral   LBP (low back pain)    Non-insulin dependent type 2 diabetes mellitus (HCC)    OA (osteoarthritis) of knee 01/2004   left   Palpitations    s/p Inf STEMI 07/2020   POBA to RCA >> CABG   STEMI involving right coronary artery ([place]) 08/03/2020   Vitamin B 12 deficiency    Past Surgical History:  Procedure Laterality Date   CARDIAC CATHETERIZATION     CORONARY ARTERY BYPASS GRAFT N/A 08/04/2020   Procedure: CORONARY ARTERY BYPASS GRAFTING (CABG), ON PUMP, TIMES SIX, USING BILATERAL INTERNAL MAMMARY ARTERIES, RIGHT ENDOSCOPICALLY HARVESTED GREATER SAPHENOUS VEIN, AND RIGHT RADIAL ARTERY OPEN HARVEST;  Surgeon: Wonda Olds, MD;  Location: [facility];  Service: Open Heart Surgery;  Laterality: N/A;   CORONARY/GRAFT ACUTE MI REVASCULARIZATION N/A 08/03/2020   Procedure: Coronary/Graft Acute MI Revascularization;  Surgeon: Sherren Mocha, MD;  Location: [facility];  Service: Cardiovascular;  Laterality: N/A;   ENDOVEIN HARVEST OF GREATER SAPHENOUS VEIN Right 08/04/2020   Procedure: ENDOVEIN HARVEST OF GREATER SAPHENOUS VEIN;  Surgeon: Wonda Olds, MD;  Location: [facility];  Service: Open Heart Surgery;  Laterality: Right;   INGUINAL HERNIA REPAIR  age 40-5   INGUINAL HERNIA REPAIR Bilateral 02/09/2014   Procedure: OPEN BILATERAL INGUINAL HERNIA REPAIR WITH MESH;  Surgeon: [name], MD;  Location:  [facility];  Service: General;  Laterality: Bilateral;   INSERTION OF MESH Bilateral 02/09/2014   Procedure: INSERTION OF MESH;  Surgeon: [name], MD;  Location: [facility];  Service: General;  Laterality: Bilateral;   KNEE ARTHROSCOPY Right    LEFT HEART CATH AND CORONARY ANGIOGRAPHY N/A 08/03/2020   Procedure: LEFT HEART CATH AND CORONARY ANGIOGRAPHY;  Surgeon: Sherren Mocha, MD;  Location: [facility];  Service: Cardiovascular;  Laterality: N/A;   ORCHIECTOMY Left 05/31/2017   Procedure: LEFT RADICAL ORCHIECTOMY;  Surgeon: Irine Seal, MD;  Location: [facility];  Service: Urology;  Laterality: Left;   PROCTOSCOPY N/A 11/30/2021   Procedure: RIGID PROCTOSCOPY;  Surgeon: Michael Boston, MD;  Location: [facility];  Service: General;  Laterality: N/A;   RADIAL ARTERY HARVEST Right 08/04/2020  Procedure: RADIAL ARTERY HARVEST;  Surgeon: Wonda Olds, MD;  Location: [facility];  Service: Open Heart Surgery;  Laterality: Right;   TEE WITHOUT CARDIOVERSION N/A 08/04/2020   Procedure: TRANSESOPHAGEAL ECHOCARDIOGRAM (TEE);  Surgeon: Wonda Olds, MD;  Location: [facility];  Service: Open Heart Surgery;  Laterality: N/A;   TONSILLECTOMY AND ADENOIDECTOMY  1960   TOTAL KNEE ARTHROPLASTY Left 01/14/2004   Family History  Problem Relation Age of Onset   Hypertension Mother    Diabetes Mother    Diabetes Father    Colon cancer Neg Hx    Esophageal cancer Neg Hx    Rectal cancer Neg Hx    Stomach cancer Neg Hx    Colon polyps Neg Hx    Social History   Socioeconomic History   Marital status: Married    Spouse name: Not on file   Number of children: Not on file   Years of education: 12   Highest education level: Not on file  Occupational History   Occupation: diabled    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  Tobacco Use   Smoking status: Former    Types: Cigars    Quit date: 08/03/2020    Years since quitting: 2.2    Smokeless tobacco: Never   Tobacco comments:    2-3 small cigars/day  Vaping Use   Vaping Use: Never used  Substance and Sexual Activity   Alcohol use: No   Drug use: No   Sexual activity: Yes  Other Topics Concern   Not on file  Social History Narrative   Not on file   Social Determinants of Health   Financial Resource Strain: Low Risk  (10/25/2022)   Overall Financial Resource Strain (CARDIA)    Difficulty of Paying Living Expenses: Not hard at all  Food Insecurity: No Food Insecurity (10/25/2022)   Hunger Vital Sign    Worried About Running Out of Food in the Last Year: Never true    Ran Out of Food in the Last Year: Never true  Transportation Needs: No Transportation Needs (10/25/2022)   PRAPARE - Hydrologist (Medical): No    Lack of Transportation (Non-Medical): No  Physical Activity: Sufficiently Active (10/25/2022)   Exercise Vital Sign    Days of Exercise per Week: 5 days    Minutes of Exercise per Session: 30 min  Stress: No Stress Concern Present (10/25/2022)   [place]    Feeling of Stress : Not at all  Social Connections: Moderately Isolated (10/25/2022)   Social Connection and Isolation Panel [NHANES]    Frequency of Communication with Friends and Family: More than three times a week    Frequency of Social Gatherings with Friends and Family: More than three times a week    Attends Religious Services: Never    Marine scientist or Organizations: No    Attends Music therapist: Never    Marital Status: Married    Tobacco Counseling Counseling given: Not Answered Tobacco comments: 2-3 small cigars/day   Clinical Intake:  Pre-visit preparation completed: Yes  Pain : No/denies pain Pain Score: 0-No pain     BMI - recorded: 30 Nutritional Status: BMI > 30  Obese Nutritional Risks: None Diabetes: Yes CBG done?: No Did pt. bring in CBG  monitor from home?: No  How often do you need to have someone help you when you read instructions, pamphlets, or  other written materials from your doctor or pharmacy?: 1 - Never What is the last grade level you completed in school?: HSG  Nutrition Risk Assessment:  Has the patient had any N/V/D within the last 2 months?  No  Does the patient have any non-healing wounds?  No  Has the patient had any unintentional weight loss or weight gain?  No   Diabetes:  Is the patient diabetic?  Yes  If diabetic, was a CBG obtained today?  No  Did the patient bring in their glucometer from home?  No  How often do you monitor your CBG's? No; controlled with diet and exercise.   Financial Strains and Diabetes Management:  Are you having any financial strains with the device, your supplies or your medication? No .  Does the patient want to be seen by Chronic Care Management for management of their diabetes?  No  Would the patient like to be referred to a Nutritionist or for Diabetic Management?  No   Diabetic Exams:  Diabetic Eye Exam: Completed 06/27/2022 Diabetic Foot Exam: Overdue, Pt has been advised about the importance in completing this exam. Pt is scheduled for diabetic foot exam on 11/06/2022.   Interpreter Needed?: No  Information entered by :: Lisette Abu, LPN.   Activities of Daily Living    10/25/2022    8:52 AM 11/30/2021    1:44 PM  In your present state of health, do you have any difficulty performing the following activities:  Hearing? 0 0  Vision? 0 0  Difficulty concentrating or making decisions? 0 0  Walking or climbing stairs? 0 0  Dressing or bathing? 0 0  Doing errands, shopping? 0 0  Preparing Food and eating ? N   Using the Toilet? N   In the past six months, have you accidently leaked urine? N   Do you have problems with loss of bowel control? N   Managing your Medications? N   Managing your Finances? N   Housekeeping or managing your Housekeeping? N      Patient Care Team: [name], Evie Lacks, MD as PCP - General (Internal Medicine) Sherren Mocha, MD as PCP - Cardiology (Cardiology) Marily Memos, MD (Orthopedic Surgery) Wonda Olds, MD (Inactive) as Consulting Physician (Cardiothoracic Surgery) Sharmon Revere as Physician Assistant (Cardiology) Irine Seal, MD as Attending Physician (Urology) Ladene Artist, MD as Consulting Physician (Gastroenterology) Alyson Ingles Candee Furbish, MD as Consulting Physician (Urology) Michael Boston, MD as Consulting Physician (Colon and Rectal Surgery) Camillo Flaming, OD as Referring Physician (Optometry)  Indicate any recent Medical Services you may have received from other than [facility] providers in the past year (date may be approximate).     Assessment:   This is a routine wellness examination for Brian Mccullough.  Hearing/Vision screen Hearing Screening - Comments:: Denies hearing difficulties   Vision Screening - Comments:: Wears rx glasses - up to date with routine eye exams with Dr. Camillo Flaming   Dietary issues and exercise activities discussed: Current Exercise Habits: Home exercise routine, Type of exercise: walking, Time (Minutes): 30, Frequency (Times/Week): 5, Weekly Exercise (Minutes/Week): 150, Intensity: Moderate, Exercise limited by: None identified   Goals Addressed             This Visit's Progress    My goal for 2024 is to ose 30 pounds by staying active and maintaining my health.       Set My Target A1C-Diabetes Type 2       Timeframe:  Long-Range Goal  Priority:  High Start Date: 10/25/2022                            Expected End Date:  04/14/2023                   Follow Up Date: 04/14/2023   - set target A1C - keep A1c <7.0%    Why is this important?   Your target A1C is decided together by you and your doctor.  It is based on several things like your age and other health issues.       Depression Screen    10/25/2022    8:51 AM 09/27/2021    9:28 AM  12/10/2020   11:31 AM 10/12/2020   12:12 PM 07/30/2020   10:34 AM 12/22/2019    8:08 AM 12/20/2018    8:46 AM  PHQ 2/9 Scores  PHQ - 2 Score 0 0 0 0 0 0 0    Fall Risk    10/25/2022    8:49 AM 09/27/2021    9:25 AM 10/12/2020    1:37 PM 07/30/2020   10:34 AM 12/22/2019    8:09 AM  Fall Risk   Falls in the past year? 0 0 0 0 0  Number falls in past yr: 0 0  0   Injury with Fall? 0 0  0   Risk for fall due to : No Fall Risks No Fall Risks Other (Comment) No Fall Risks   Risk for fall due to: Comment   Single leg stand less than 15 seconds    Follow up Falls prevention discussed Falls prevention discussed Falls evaluation completed Falls evaluation completed Falls evaluation completed    [place]:  Any stairs in or around the home? Yes  If so, are there any without handrails? No  Home free of loose throw rugs in walkways, pet beds, electrical cords, etc? Yes  Adequate lighting in your home to reduce risk of falls? Yes   ASSISTIVE DEVICES UTILIZED TO PREVENT FALLS:  Life alert? No  Use of a cane, walker or w/c? No  Grab bars in the bathroom? Yes  Shower chair or bench in shower? No  Elevated toilet seat or a handicapped toilet? Yes   TIMED UP AND GO:  Was the test performed? No . Phone Visit  Cognitive Function:        10/25/2022    8:51 AM  6CIT Screen  What Year? 0 points  What month? 0 points  What time? 0 points  Count back from 20 0 points  Months in reverse 0 points  Repeat phrase 0 points  Total Score 0 points    Immunizations Immunization History  Administered Date(s) Administered   Fluad Quad(high Dose 65+) 07/30/2020, 08/18/2021, 08/07/2022   Influenza Split 09/28/2011, 06/19/2012   Influenza Whole 06/29/2008, 07/05/2009, 09/20/2010   Influenza, High Dose Seasonal PF 08/14/2017, 06/14/2018, 07/12/2019   Influenza,inj,Quad PF,6+ Mos 06/19/2013, 05/29/2014, 08/30/2015, 08/01/2016   PFIZER(Purple Top)SARS-COV-2  Vaccination 10/28/2019, 11/18/2019, 07/21/2020   Pfizer Covid-19 Vaccine Bivalent Booster 81yr & up 08/16/2021   Pneumococcal Conjugate-13 05/29/2014   Pneumococcal Polysaccharide-23 11/02/2009, 11/15/2017   Td 11/02/2009   Tdap 07/30/2020   Zoster, Live 12/30/2013    TDAP status: Up to date  Flu Vaccine status: Up to date  Pneumococcal vaccine status: Up to date  Covid-19 vaccine status: Completed vaccines  Qualifies for Shingles Vaccine? Yes  Zostavax completed No   Shingrix Completed?: No.    Education has been provided regarding the importance of this vaccine. Patient has been advised to call insurance company to determine out of pocket expense if they have not yet received this vaccine. Advised may also receive vaccine at local pharmacy or Health Dept. Verbalized acceptance and understanding.  Screening Tests Health Maintenance  Topic Date Due   FOOT EXAM  Never done   Diabetic kidney evaluation - Urine ACR  03/29/2022   COVID-19 Vaccine (5 - 2023-24 season) 06/09/2022   HEMOGLOBIN A1C  02/06/2023   OPHTHALMOLOGY EXAM  06/28/2023   Diabetic kidney evaluation - eGFR measurement  08/08/2023   Medicare Annual Wellness (AWV)  10/26/2023   COLONOSCOPY (Pts 45-52yr Insurance coverage will need to be confirmed)  11/29/2028   DTaP/Tdap/Td (3 - Td or Tdap) 07/30/2030   Pneumonia Vaccine 72 Years old  Completed   INFLUENZA VACCINE  Completed   Hepatitis C Screening  Completed   HPV VACCINES  Aged Out   Zoster Vaccines- Shingrix  Discontinued    Health Maintenance  Health Maintenance Due  Topic Date Due   FOOT EXAM  Never done   Diabetic kidney evaluation - Urine ACR  03/29/2022   COVID-19 Vaccine (5 - 2023-24 season) 06/09/2022    Colorectal cancer screening: Type of screening: Colonoscopy. Completed 11/29/2021. Repeat every 7 years  Lung Cancer Screening: (Low Dose CT Chest recommended if Age 71-80years, 30 pack-year currently smoking OR have quit w/in 15years.)  does not qualify.   Lung Cancer Screening Referral: no  Additional Screening:  Hepatitis C Screening: does qualify; Completed 08/01/2016  Vision Screening: Recommended annual ophthalmology exams for early detection of glaucoma and other disorders of the eye. Is the patient up to date with their annual eye exam?  Yes  Who is the provider or what is the name of the office in which the patient attends annual eye exams? TCamillo Flaming MD. If pt is not established with a provider, would they like to be referred to a provider to establish care? No .   Dental Screening: Recommended annual dental exams for proper oral hygiene  Community Resource Referral / Chronic Care Management: CRR required this visit?  No   CCM required this visit?  No      Plan:     I have personally reviewed and noted the following in the patient's chart:   Medical and social history Use of alcohol, tobacco or illicit drugs  Current medications and supplements including opioid prescriptions. Patient is not currently taking opioid prescriptions. Functional ability and status Nutritional status Physical activity Advanced directives List of other physicians Hospitalizations, surgeries, and ER visits in previous 12 months Vitals Screenings to include cognitive, depression, and falls Referrals and appointments  In addition, I have reviewed and discussed with patient certain preventive protocols, quality metrics, and best practice recommendations. A written personalized care plan for preventive services as well as general preventive health recommendations were provided to patient.     SSheral Flow LPN   19/21/1940  Nurse Notes: N/A  Medical screening examination/treatment/procedure(s) were performed by non-physician practitioner and as supervising physician I was immediately available for consultation/collaboration.  I agree with above. ALew Dawes MD

## 2022-11-06 ENCOUNTER — Ambulatory Visit (INDEPENDENT_AMBULATORY_CARE_PROVIDER_SITE_OTHER): Payer: Medicare Other | Admitting: Internal Medicine

## 2022-11-06 ENCOUNTER — Encounter: Payer: Self-pay | Admitting: Internal Medicine

## 2022-11-06 VITALS — BP 120/74 | HR 59 | Temp 98.2°F | Ht 75.0 in | Wt 258.0 lb

## 2022-11-06 DIAGNOSIS — E538 Deficiency of other specified B group vitamins: Secondary | ICD-10-CM

## 2022-11-06 DIAGNOSIS — E1165 Type 2 diabetes mellitus with hyperglycemia: Secondary | ICD-10-CM

## 2022-11-06 DIAGNOSIS — E559 Vitamin D deficiency, unspecified: Secondary | ICD-10-CM | POA: Diagnosis not present

## 2022-11-06 MED ORDER — METFORMIN HCL 500 MG PO TABS: 500.0000 mg | ORAL_TABLET | Freq: Every day | ORAL | 3 refills | Status: DC

## 2022-11-06 MED ORDER — BENAZEPRIL HCL 20 MG PO TABS: 20.0000 mg | ORAL_TABLET | Freq: Every day | ORAL | 3 refills | Status: DC

## 2022-11-06 MED ORDER — TRAZODONE HCL 50 MG PO TABS: ORAL_TABLET | ORAL | 3 refills | Status: DC

## 2022-11-06 MED ORDER — FUROSEMIDE 20 MG PO TABS: ORAL_TABLET | ORAL | 3 refills | Status: DC

## 2022-11-06 MED ORDER — ATORVASTATIN CALCIUM 40 MG PO TABS: 40.0000 mg | ORAL_TABLET | Freq: Every day | ORAL | 3 refills | Status: DC

## 2022-11-06 MED ORDER — MELOXICAM 15 MG PO TABS: 15.0000 mg | ORAL_TABLET | Freq: Every day | ORAL | 3 refills | Status: DC | PRN

## 2022-11-06 NOTE — Assessment & Plan Note (Signed)
On B12 

## 2022-11-06 NOTE — Assessment & Plan Note (Signed)
On Metformin, Jardiance 

## 2022-11-06 NOTE — Assessment & Plan Note (Signed)
On Vit D 

## 2022-11-06 NOTE — Progress Notes (Signed)
Subjective:  Patient ID: Brian Mccullough, male    DOB: Dec 07, 1950  Age: 72 y.o. MRN: [id number]  CC: Follow-up (Having night sweats only on his head )   HPI Brian Mccullough presents for DM, CAD, HTN  Outpatient Medications Prior to Visit  Medication Sig Dispense Refill   acetaminophen (TYLENOL) 500 MG tablet Take 1,000 mg by mouth every 6 (six) hours as needed for headache or moderate pain.     aspirin 81 MG EC tablet Take 81 mg by mouth daily.     cholecalciferol (VITAMIN D3) 25 MCG (1000 UNIT) tablet Take 1,000 Units by mouth daily.     cyanocobalamin 1000 MCG tablet Take 1,000 mcg by mouth daily.     empagliflozin (JARDIANCE) 10 MG TABS tablet Take 1 tablet (10 mg total) by mouth daily. 30 tablet 11   ezetimibe (ZETIA) 10 MG tablet Take 1 tablet (10 mg total) by mouth daily. 90 tablet 1   loratadine (CLARITIN) 10 MG tablet Take 10 mg by mouth daily as needed for allergies or rhinitis.     metoprolol tartrate (LOPRESSOR) 25 MG tablet Take 0.5 tablets (12.5 mg total) by mouth daily. Can take an additional half tablet daily as needed 30 tablet 0   nitroGLYCERIN (NITROSTAT) 0.4 MG SL tablet Place 1 tablet (0.4 mg total) under the tongue every 5 (five) minutes as needed for chest pain. 30 tablet 3   tiZANidine (ZANAFLEX) 4 MG tablet Take 1 tablet (4 mg total) by mouth every 8 (eight) hours as needed for muscle spasms. 90 tablet 0   traMADol (ULTRAM) 50 MG tablet TAKE 1-2 TABLETS (50-100 MG TOTAL) BY MOUTH EVERY 6 (SIX) HOURS AS NEEDED FOR MODERATE PAIN OR SEVERE PAIN. 120 tablet 3   triamcinolone ointment (KENALOG) 0.1 % Apply 1 application. topically 2 (two) times daily as needed (rash). 90 g 3   atorvastatin (LIPITOR) 40 MG tablet TAKE 1 TABLET BY MOUTH DAILY 100 tablet 2   benazepril (LOTENSIN) 20 MG tablet Take 1 tablet (20 mg total) by mouth daily. 90 tablet 3   furosemide (LASIX) 20 MG tablet TAKE 1 TABLET BY MOUTH AS NEEDED FOR FLUID OR EDEMA. MAX 1 TABLET PER DAY. 100 tablet 2    meloxicam (MOBIC) 15 MG tablet TAKE 1 TABLET BY MOUTH DAILY AS  NEEDED FOR PAIN 90 tablet 3   metFORMIN (GLUCOPHAGE) 500 MG tablet TAKE 1 TABLET BY MOUTH DAILY  WITH BREAKFAST 100 tablet 2   traZODone (DESYREL) 50 MG tablet TAKE 1/2 TO 1 TABLET BY  MOUTH AT BEDTIME AS NEEDED  FOR SLEEP 90 tablet 3   No facility-administered medications prior to visit.    ROS: Review of Systems  Constitutional:  Negative for appetite change, fatigue and unexpected weight change.  HENT:  Negative for congestion, nosebleeds, sneezing, sore throat and trouble swallowing.   Eyes:  Negative for itching and visual disturbance.  Respiratory:  Negative for cough.   Cardiovascular:  Negative for chest pain, palpitations and leg swelling.  Gastrointestinal:  Negative for abdominal distention, blood in stool, diarrhea and nausea.  Genitourinary:  Negative for frequency and hematuria.  Musculoskeletal:  Negative for back pain, gait problem, joint swelling and neck pain.  Skin:  Negative for rash.  Neurological:  Negative for dizziness, tremors, speech difficulty and weakness.  Psychiatric/Behavioral:  Negative for agitation, dysphoric mood and sleep disturbance. The patient is not nervous/anxious.     Objective:  BP 120/74 (BP Location: Left Arm, Patient Position: Sitting, Cuff  Size: Normal)   Pulse (!) 59   Temp 98.2 F (36.8 C) (Oral)   Ht '6\' 3"'$  (1.905 m)   Wt 258 lb (117 kg)   SpO2 99%   BMI 32.25 kg/m   BP Readings from Last 3 Encounters:  11/06/22 120/74  09/19/22 (!) 110/58  08/17/22 124/76    Wt Readings from Last 3 Encounters:  11/06/22 258 lb (117 kg)  10/25/22 240 lb (108.9 kg)  09/19/22 255 lb (115.7 kg)    Physical Exam Constitutional:      General: He is not in acute distress.    Appearance: Normal appearance. He is well-developed.     Comments: NAD  Eyes:     Conjunctiva/sclera: Conjunctivae normal.     Pupils: Pupils are equal, round, and reactive to light.  Neck:     Thyroid:  No thyromegaly.     Vascular: No JVD.  Cardiovascular:     Rate and Rhythm: Normal rate and regular rhythm.     Heart sounds: Normal heart sounds. No murmur heard.    No friction rub. No gallop.  Pulmonary:     Effort: Pulmonary effort is normal. No respiratory distress.     Breath sounds: Normal breath sounds. No wheezing or rales.  Chest:     Chest wall: No tenderness.  Abdominal:     General: Bowel sounds are normal. There is no distension.     Palpations: Abdomen is soft. There is no mass.     Tenderness: There is no abdominal tenderness. There is no guarding or rebound.  Musculoskeletal:        General: No tenderness. Normal range of motion.     Cervical back: Normal range of motion.  Lymphadenopathy:     Cervical: No cervical adenopathy.  Skin:    General: Skin is warm and dry.     Findings: No rash.  Neurological:     Mental Status: He is alert and oriented to person, place, and time.     Cranial Nerves: No cranial nerve deficit.     Motor: No weakness or abnormal muscle tone.     Coordination: Coordination normal.     Gait: Gait normal.     Deep Tendon Reflexes: Reflexes are normal and symmetric.  Psychiatric:        Behavior: Behavior normal.        Thought Content: Thought content normal.        Judgment: Judgment normal.   Keloid scar on chest  Lab Results  Component Value Date   WBC 12.2 (H) 12/01/2021   HGB 12.2 (L) 12/01/2021   HCT 36.6 (L) 12/01/2021   PLT 173 12/01/2021   GLUCOSE 89 08/07/2022   CHOL 137 08/23/2022   TRIG 85 08/23/2022   HDL 36 (L) 08/23/2022   LDLDIRECT 88.1 01/23/2011   LDLCALC 85 08/23/2022   ALT 10 08/23/2022   AST 15 08/23/2022   NA 139 08/07/2022   K 4.1 08/07/2022   CL 106 08/07/2022   CREATININE 1.27 08/07/2022   BUN 12 08/07/2022   CO2 26 08/07/2022   TSH 0.70 08/07/2022   PSA 0.26 07/30/2020   INR 1.5 (H) 08/04/2020   HGBA1C 6.1 08/07/2022   MICROALBUR 2.0 (H) 03/29/2021    No results found.  Assessment &  Plan:   Problem List Items Addressed This Visit       Endocrine   Diabetes mellitus type 2, controlled ([place]) - Primary    On Metformin, Jardiance  Relevant Medications   atorvastatin (LIPITOR) 40 MG tablet   benazepril (LOTENSIN) 20 MG tablet   metFORMIN (GLUCOPHAGE) 500 MG tablet     Other   B12 deficiency    On B12      Vitamin D deficiency    On Vit D         Meds ordered this encounter  Medications   atorvastatin (LIPITOR) 40 MG tablet    Sig: Take 1 tablet (40 mg total) by mouth daily.    Dispense:  100 tablet    Refill:  3    Please send a replace/new response with 100-Day Supply if appropriate to maximize member benefit. Requesting 1 year supply.   benazepril (LOTENSIN) 20 MG tablet    Sig: Take 1 tablet (20 mg total) by mouth daily.    Dispense:  90 tablet    Refill:  3    Requesting 1 year supply   furosemide (LASIX) 20 MG tablet    Sig: TAKE 1 TABLET BY MOUTH AS NEEDED FOR FLUID OR EDEMA. MAX 1 TABLET PER DAY.    Dispense:  100 tablet    Refill:  3    Please send a replace/new response with 100-Day Supply if appropriate to maximize member benefit. Requesting 1 year supply.   meloxicam (MOBIC) 15 MG tablet    Sig: Take 1 tablet (15 mg total) by mouth daily as needed. for pain    Dispense:  90 tablet    Refill:  3    Requesting 1 year supply   metFORMIN (GLUCOPHAGE) 500 MG tablet    Sig: Take 1 tablet (500 mg total) by mouth daily with breakfast.    Dispense:  100 tablet    Refill:  3    Please send a replace/new response with 100-Day Supply if appropriate to maximize member benefit. Requesting 1 year supply.   traZODone (DESYREL) 50 MG tablet    Sig: TAKE 1/2 TO 1 TABLET BY  MOUTH AT BEDTIME AS NEEDED  FOR SLEEP    Dispense:  90 tablet    Refill:  3    Requesting 1 year supply      Follow-up: Return in about 3 months (around 02/05/2023) for a follow-up visit.  Walker Kehr, MD

## 2022-12-31 IMAGING — CT CT RENAL STONE PROTOCOL
2 of 4 series · 15 of 46 positions shown, 17 images · non-contrast
Comparison: CT abdomen pelvis report dated 05/02/2000. The images
are not available for direct comparison.

CLINICAL DATA: Hematuria.

EXAM:
CT ABDOMEN AND PELVIS WITHOUT CONTRAST
TECHNIQUE: Multidetector CT imaging of the abdomen and pelvis was performed
following the standard protocol without IV contrast.

[Series 3: abd/ pelvis 5.0 i30f 2 · axial · 0.79mm/px · z∈[-673,-208]mm · 12 of 103 slices shown, 14 images]
[im 5/103  soft-tissue]
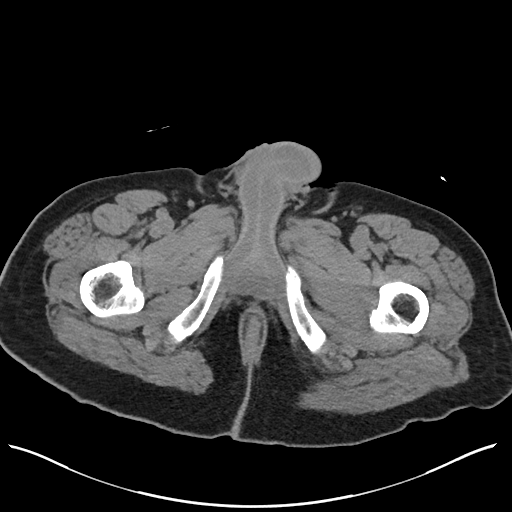
[im 5/103  bone]
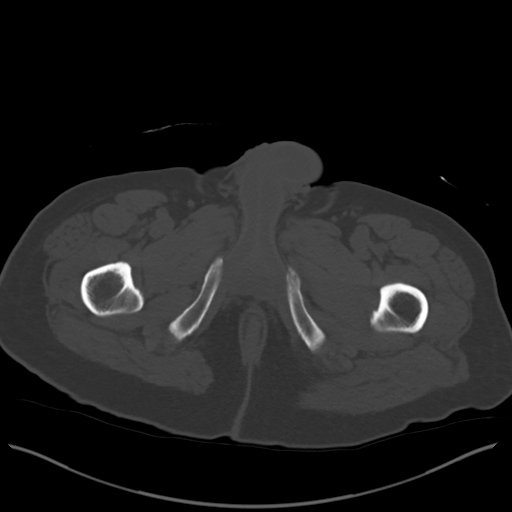
[im 14/103  soft-tissue]
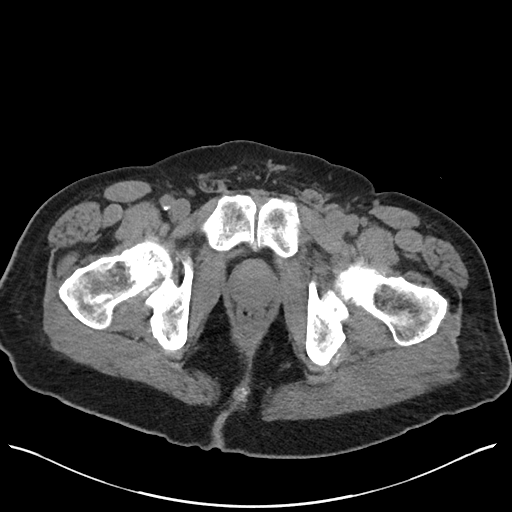
[im 23/103  soft-tissue]
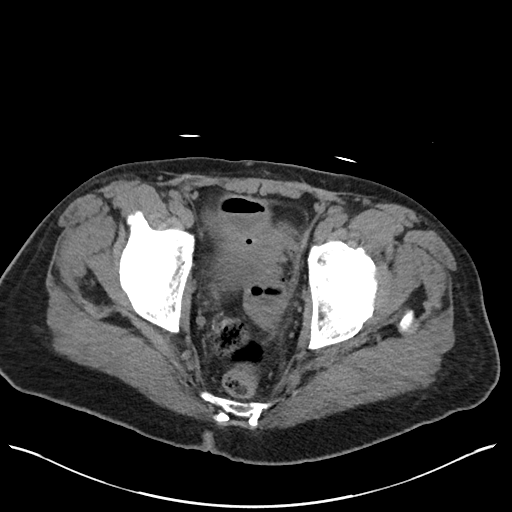
[im 32/103  soft-tissue]
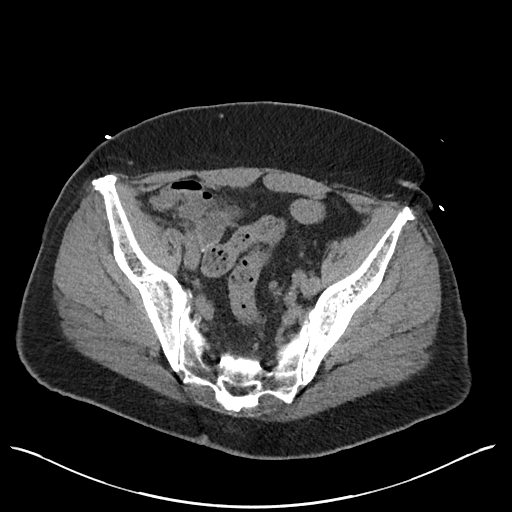
[im 40/103  soft-tissue]
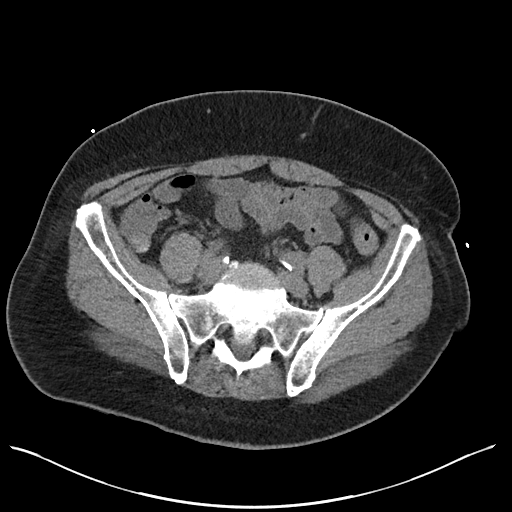
[im 49/103  soft-tissue]
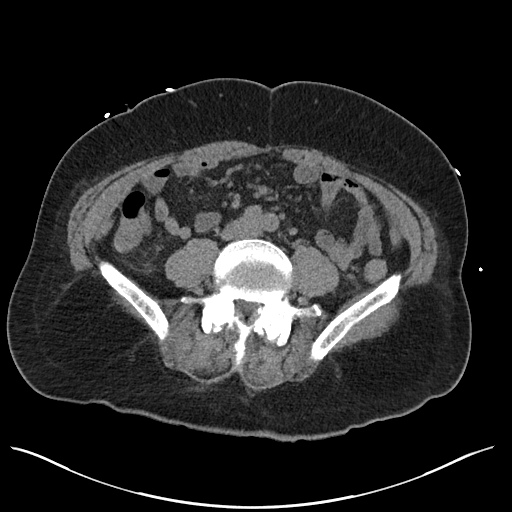
[im 54/103  soft-tissue]
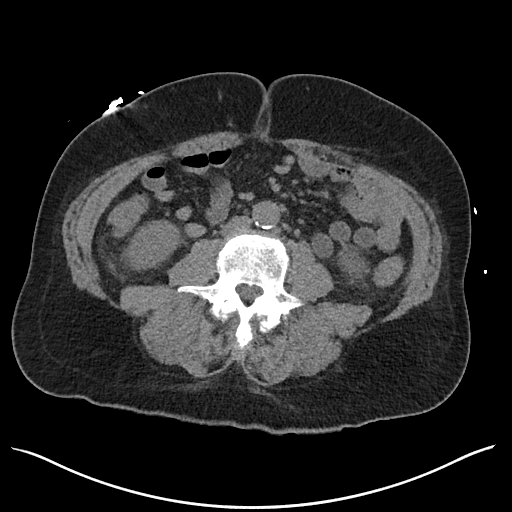
[im 63/103  soft-tissue]
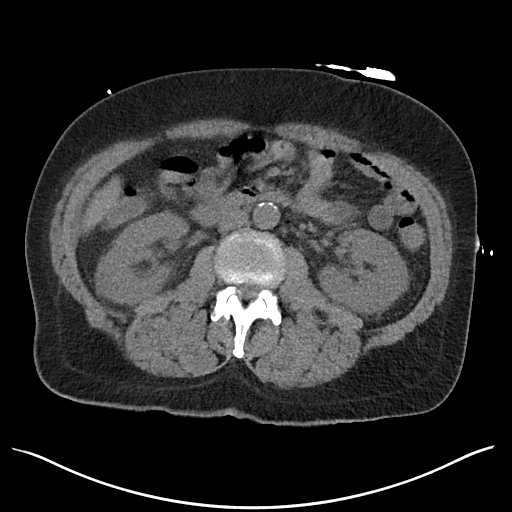
[im 71/103  soft-tissue]
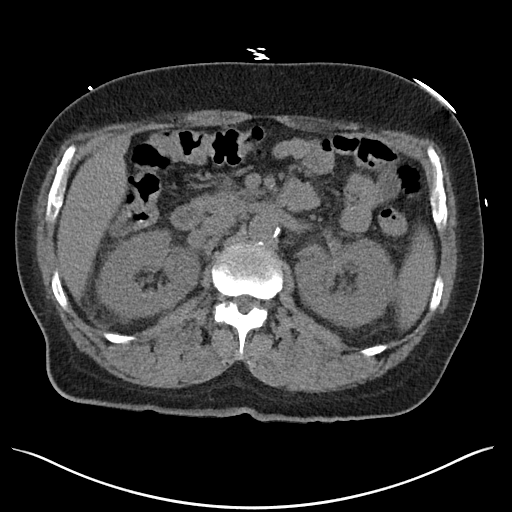
[im 71/103  bone]
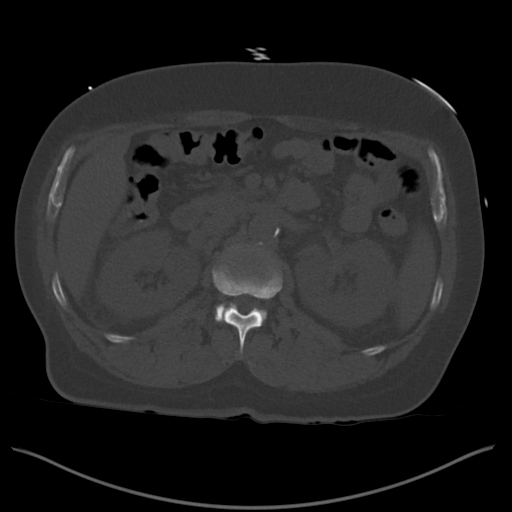
[im 80/103  soft-tissue]
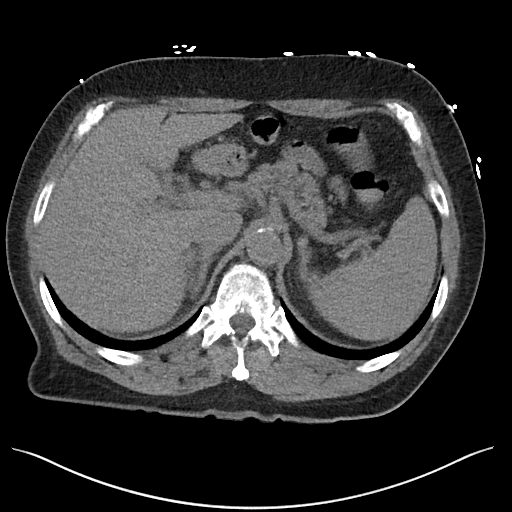
[im 89/103  soft-tissue]
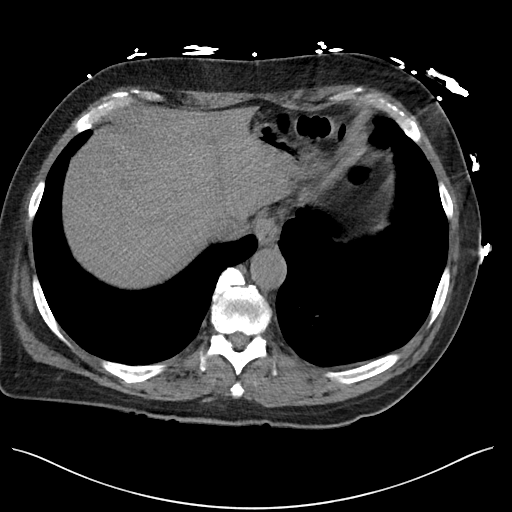
[im 98/103  soft-tissue]
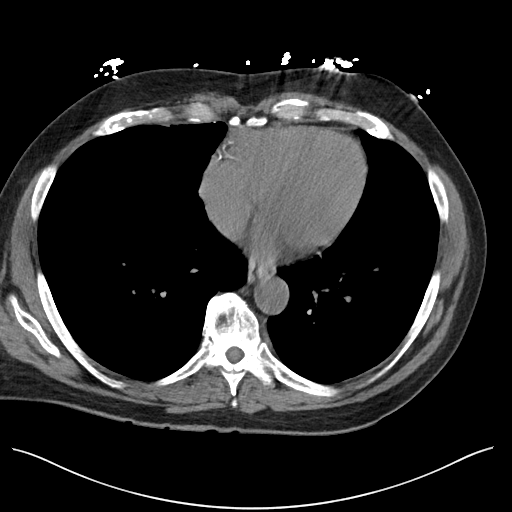

[Series 6: cor st · coronal · 0.75mm/px · 3 of 101 slices shown]
[im 34/101  soft-tissue]
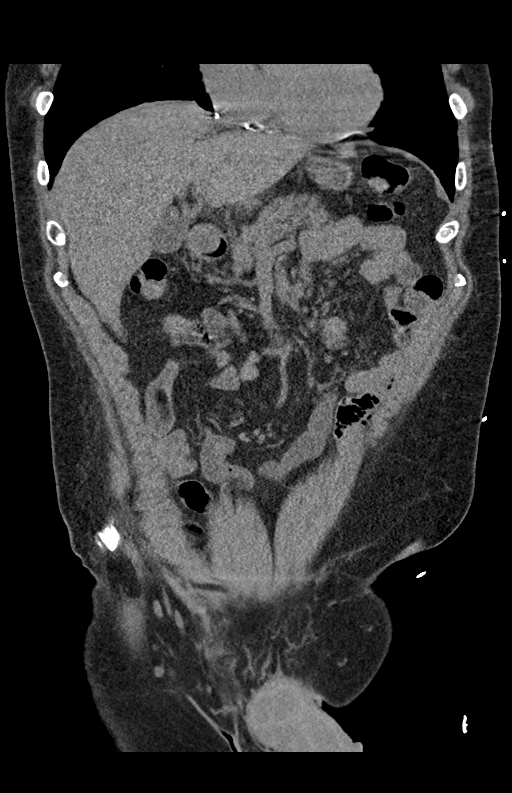
[im 45/101  soft-tissue]
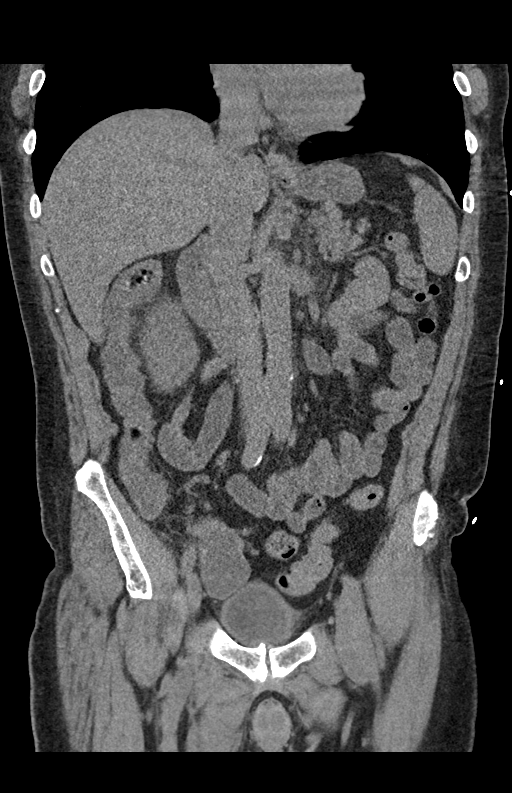
[im 56/101  soft-tissue]
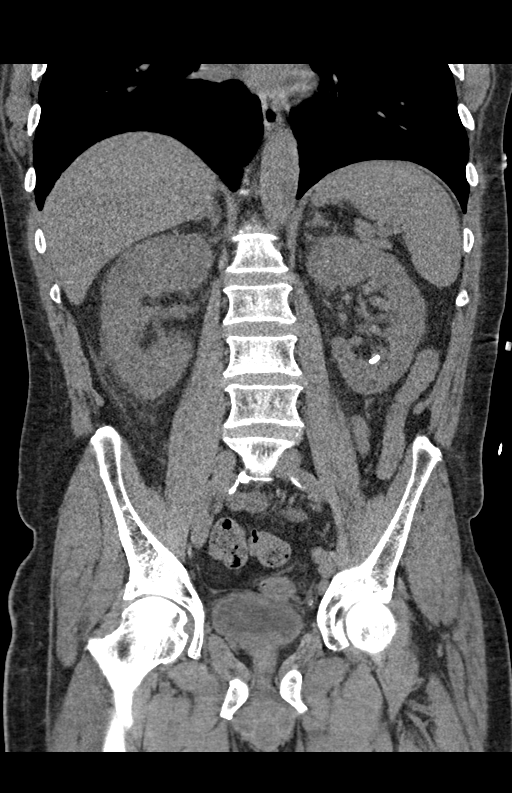

[15 of 46 positions shown; findings below may reference images not displayed]

FINDINGS: Evaluation of this exam is limited in the absence of intravenous
contrast.

Lower chest: The visualized lung bases are clear. There is coronary
vascular calcification.

No intra-abdominal free air or free fluid.

Hepatobiliary: The liver is unremarkable. No intrahepatic biliary
dilatation. Small gallstones. No pericholecystic fluid or evidence
of acute cholecystitis by CT.

Pancreas: Unremarkable. No pancreatic ductal dilatation or
surrounding inflammatory changes.

Spleen: Normal in size without focal abnormality.

Adrenals/Urinary Tract: The adrenal glands are unremarkable. There
is a 9 mm nonobstructing left renal inferior pole calculus. No
hydronephrosis. There is no hydronephrosis or nephrolithiasis on the
right. Faint area of hypodensity in the right renal parenchyma is
not evaluated but may represent a cyst or focal area of edema or
developing abscess. There is right perinephric stranding.
Correlation with urinalysis recommended to evaluate for possibility
of pyelonephritis. The visualized ureters appear unremarkable. There
is diffuse haziness of the bladder wall concerning for cystitis.
There is loss of fat plane between the sigmoid colon and bladder
dome with air within the urinary bladder most consistent with a
colovesical fistula.

Stomach/Bowel: There is sigmoid diverticulosis without definite
active inflammation. There is no bowel obstruction. The appendix is
normal.

Vascular/Lymphatic: Moderate aortoiliac atherosclerotic disease. The
IVC is unremarkable. No portal venous gas. There is no adenopathy.

Reproductive: The prostate and seminal vesicles are grossly
unremarkable. No pelvic mass.

Other: None

Musculoskeletal: Degenerative changes of the spine. No acute osseous
pathology.
IMPRESSION: 1. Sigmoid diverticulosis with colovesical fistula and findings of
UTI and right-sided pyelonephritis. Developing abscess in the right
kidney is not excluded.
2. A 9 mm nonobstructing left renal inferior pole calculus. No
hydronephrosis.
3. Cholelithiasis.
4. No bowel obstruction. Normal appendix.
5. Aortic Atherosclerosis ([id number]).

## 2023-01-10 ENCOUNTER — Other Ambulatory Visit: Payer: Self-pay | Admitting: Cardiovascular Disease

## 2023-01-13 ENCOUNTER — Other Ambulatory Visit: Payer: Self-pay | Admitting: Nurse Practitioner

## 2023-02-02 ENCOUNTER — Encounter: Payer: Self-pay | Admitting: Pharmacist

## 2023-02-02 NOTE — Progress Notes (Signed)
[facility])     [facility] Quality Pharmacy Team Statin Quality Measure Assessment  02/02/2023  Brian Mccullough Dec 06, 1950 [id number]  Per review of chart and payor information, [name] has a diagnosis of cardiovascular disease but is not currently filling a statin prescription. This places patient into the [facility] (Statin Use In Patients with Cardiovascular Disease) measure for CMS.  In the past he has had issues with muscle pain with statins.  He has not filled is atorvastatin since 08/2022.  Please consider discussing the importance of statin compliance or code for past statin intolerance at [facility] office visit to remove patient from the CMS measure for 2024.    Please consider ONE of the following recommendations:  Initiate high intensity statin Atorvastatin 40mg  once daily, #90, 3 refills   Rosuvastatin 20mg  once daily, #90, 3 refills    Initiate moderate intensity  statin with reduced frequency if prior  statin intolerance 1x weekly, #13, 3 refills   2x weekly, #26, 3 refills   3x weekly, #39, 3 refills    Code for past statin intolerance  (required annually)  Provider Requirements: Must associate code during an office visit or telehealth encounter   Drug Induced Myopathy G72.0   Myalgia ([facility] ONLY) M79.1   Myositis, unspecified M60.9   Myopathy, unspecified G72.9   Rhabdomyolysis M62.82   Thank you for allowing [facility] to be a part of this patient's care.  Dellie Burns, PharmD [facility]  [facility] Clinical Pharmacist Office: [phone]

## 2023-02-05 ENCOUNTER — Ambulatory Visit (INDEPENDENT_AMBULATORY_CARE_PROVIDER_SITE_OTHER): Payer: Medicare Other | Admitting: Internal Medicine

## 2023-02-05 ENCOUNTER — Encounter: Payer: Self-pay | Admitting: Internal Medicine

## 2023-02-05 VITALS — BP 118/78 | HR 32 | Temp 98.3°F | Ht 75.0 in | Wt 256.0 lb

## 2023-02-05 DIAGNOSIS — E785 Hyperlipidemia, unspecified: Secondary | ICD-10-CM | POA: Diagnosis not present

## 2023-02-05 DIAGNOSIS — E538 Deficiency of other specified B group vitamins: Secondary | ICD-10-CM

## 2023-02-05 DIAGNOSIS — E1165 Type 2 diabetes mellitus with hyperglycemia: Secondary | ICD-10-CM

## 2023-02-05 DIAGNOSIS — G8929 Other chronic pain: Secondary | ICD-10-CM

## 2023-02-05 DIAGNOSIS — Z7984 Long term (current) use of oral hypoglycemic drugs: Secondary | ICD-10-CM | POA: Diagnosis not present

## 2023-02-05 DIAGNOSIS — E119 Type 2 diabetes mellitus without complications: Secondary | ICD-10-CM | POA: Diagnosis not present

## 2023-02-05 DIAGNOSIS — M5441 Lumbago with sciatica, right side: Secondary | ICD-10-CM

## 2023-02-05 DIAGNOSIS — M5442 Lumbago with sciatica, left side: Secondary | ICD-10-CM | POA: Diagnosis not present

## 2023-02-05 DIAGNOSIS — I10 Essential (primary) hypertension: Secondary | ICD-10-CM | POA: Diagnosis not present

## 2023-02-05 LAB — URINALYSIS
Hgb urine dipstick: NEGATIVE
Leukocytes,Ua: NEGATIVE
Nitrite: NEGATIVE
Specific Gravity, Urine: 1.025 (ref 1.000–1.030)
Total Protein, Urine: NEGATIVE
Urine Glucose: >=1000 — AB
Urobilinogen, UA: 0.2 (ref 0.0–1.0)
pH: 6 (ref 5.0–8.0)

## 2023-02-05 LAB — MICROALBUMIN / CREATININE URINE RATIO
Creatinine,U: 218.5 mg/dL
Microalb Creat Ratio: 1.5 mg/g (ref 0.0–30.0)
Microalb, Ur: 3.3 mg/dL — ABNORMAL HIGH (ref 0.0–1.9)

## 2023-02-05 LAB — HEMOGLOBIN A1C: Hgb A1c MFr Bld: 6.1 % (ref 4.6–6.5)

## 2023-02-05 LAB — TSH: TSH: 0.6 u[IU]/mL (ref 0.35–5.50)

## 2023-02-05 LAB — PSA: PSA: 0.27 ng/mL (ref 0.10–4.00)

## 2023-02-05 MED ORDER — METOPROLOL TARTRATE 25 MG PO TABS: 12.5000 mg | ORAL_TABLET | Freq: Every day | ORAL | 1 refills | Status: DC

## 2023-02-05 MED ORDER — TRAMADOL HCL 50 MG PO TABS: 50.0000 mg | ORAL_TABLET | Freq: Four times a day (QID) | ORAL | 3 refills | Status: DC | PRN

## 2023-02-05 NOTE — Assessment & Plan Note (Signed)
Tramadol, Zanaflex prn  Potential benefits of a long term opioids use as well as potential risks (i.e. addiction risk, apnea etc) and complications (i.e. Somnolence, constipation and others) were explained to the patient and were aknowledged. TENS unit for pain 

## 2023-02-05 NOTE — Assessment & Plan Note (Signed)
Cont on Vit B12 

## 2023-02-05 NOTE — Addendum Note (Signed)
Addended by: Tresa Garter on: 02/05/2023 09:33 AM   Modules accepted: Orders

## 2023-02-05 NOTE — Assessment & Plan Note (Signed)
On Jardiance and Benazepril Hydrate well

## 2023-02-05 NOTE — Addendum Note (Signed)
Addended by: Tresa Garter on: 02/05/2023 09:31 AM   Modules accepted: Level of Service

## 2023-02-05 NOTE — Progress Notes (Addendum)
Subjective:  Patient ID: Brian Mccullough, male    DOB: [birth year]  Age: 72 y.o. MRN: [id number]  CC: Follow-up (3 [facility] F/U)   HPI Brian Mccullough presents for CAD, HTN, DM  Outpatient Medications Prior to Visit  Medication Sig Dispense Refill   acetaminophen (TYLENOL) 500 MG tablet Take 1,000 mg by mouth every 6 (six) hours as needed for headache or moderate pain.     aspirin 81 MG EC tablet Take 81 mg by mouth daily.     atorvastatin (LIPITOR) 40 MG tablet Take 1 tablet (40 mg total) by mouth daily. 100 tablet 3   benazepril (LOTENSIN) 20 MG tablet Take 1 tablet (20 mg total) by mouth daily. 90 tablet 3   cholecalciferol (VITAMIN D3) 25 MCG (1000 UNIT) tablet Take 1,000 Units by mouth daily.     clopidogrel (PLAVIX) 75 MG tablet Take 75 mg by mouth daily.     cyanocobalamin 1000 MCG tablet Take 1,000 mcg by mouth daily.     empagliflozin (JARDIANCE) 10 MG TABS tablet Take 1 tablet (10 mg total) by mouth daily. 30 tablet 11   ezetimibe (ZETIA) 10 MG tablet TAKE 1 TABLET BY MOUTH EVERY DAY 90 tablet 2   furosemide (LASIX) 20 MG tablet TAKE 1 TABLET BY MOUTH AS NEEDED FOR FLUID OR EDEMA. MAX 1 TABLET PER DAY. 100 tablet 3   loratadine (CLARITIN) 10 MG tablet Take 10 mg by mouth daily as needed for allergies or rhinitis.     meloxicam (MOBIC) 15 MG tablet Take 1 tablet (15 mg total) by mouth daily as needed. for pain 90 tablet 3   metFORMIN (GLUCOPHAGE) 500 MG tablet Take 1 tablet (500 mg total) by mouth daily with breakfast. 100 tablet 3   tiZANidine (ZANAFLEX) 4 MG tablet Take 1 tablet (4 mg total) by mouth every 8 (eight) hours as needed for muscle spasms. 90 tablet 0   traZODone (DESYREL) 50 MG tablet TAKE 1/2 TO 1 TABLET BY  MOUTH AT BEDTIME AS NEEDED  FOR SLEEP 90 tablet 3   triamcinolone ointment (KENALOG) 0.1 % Apply 1 application. topically 2 (two) times daily as needed (rash). 90 g 3   metoprolol tartrate (LOPRESSOR) 25 MG tablet Take 0.5 tablets (12.5 mg total) by mouth daily.  Can take an additional half tablet daily as needed 30 tablet 0   traMADol (ULTRAM) 50 MG tablet TAKE 1-2 TABLETS (50-100 MG TOTAL) BY MOUTH EVERY 6 (SIX) HOURS AS NEEDED FOR MODERATE PAIN OR SEVERE PAIN. 120 tablet 3   nitroGLYCERIN (NITROSTAT) 0.4 MG SL tablet Place 1 tablet (0.4 mg total) under the tongue every 5 (five) minutes as needed for chest pain. 30 tablet 3   No facility-administered medications prior to visit.    ROS: Review of Systems  Constitutional:  Negative for appetite change, fatigue and unexpected weight change.  HENT:  Negative for congestion, nosebleeds, sneezing, sore throat and trouble swallowing.   Eyes:  Negative for itching and visual disturbance.  Respiratory:  Negative for cough.   Cardiovascular:  Negative for chest pain, palpitations and leg swelling.  Gastrointestinal:  Negative for abdominal distention, blood in stool, diarrhea and nausea.  Genitourinary:  Negative for frequency and hematuria.  Musculoskeletal:  Positive for back pain. Negative for gait problem, joint swelling and neck pain.  Skin:  Negative for rash.  Neurological:  Negative for dizziness, tremors, speech difficulty and weakness.  Psychiatric/Behavioral:  Negative for agitation, dysphoric mood and sleep disturbance. The patient is not  nervous/anxious.     Objective:  BP 118/78 (BP Location: Right Arm, Patient Position: Sitting, Cuff Size: Large)   Pulse (!) 32   Temp 98.3 F (36.8 C) (Oral)   Ht 6\' 3"  (1.905 m)   Wt 256 lb (116.1 kg)   SpO2 97%   BMI 32.00 kg/m   BP Readings from Last 3 Encounters:  02/05/23 118/78  11/06/22 120/74  09/19/22 (!) 110/58    Wt Readings from Last 3 Encounters:  02/05/23 256 lb (116.1 kg)  11/06/22 258 lb (117 kg)  10/25/22 240 lb (108.9 kg)    Physical Exam Constitutional:      General: He is not in acute distress.    Appearance: He is well-developed. He is obese.     Comments: NAD  Eyes:     Conjunctiva/sclera: Conjunctivae normal.      Pupils: Pupils are equal, round, and reactive to light.  Neck:     Thyroid: No thyromegaly.     Vascular: No JVD.  Cardiovascular:     Rate and Rhythm: Normal rate and regular rhythm.     Heart sounds: Normal heart sounds. No murmur heard.    No friction rub. No gallop.  Pulmonary:     Effort: Pulmonary effort is normal. No respiratory distress.     Breath sounds: Normal breath sounds. No wheezing or rales.  Chest:     Chest wall: No tenderness.  Abdominal:     General: Bowel sounds are normal. There is no distension.     Palpations: Abdomen is soft. There is no mass.     Tenderness: There is no abdominal tenderness. There is no guarding or rebound.  Musculoskeletal:        General: No tenderness. Normal range of motion.     Cervical back: Normal range of motion.  Lymphadenopathy:     Cervical: No cervical adenopathy.  Skin:    General: Skin is warm and dry.     Findings: No rash.  Neurological:     Mental Status: He is alert and oriented to person, place, and time.     Cranial Nerves: No cranial nerve deficit.     Motor: No abnormal muscle tone.     Coordination: Coordination normal.     Gait: Gait normal.     Deep Tendon Reflexes: Reflexes are normal and symmetric.  Psychiatric:        Behavior: Behavior normal.        Thought Content: Thought content normal.        Judgment: Judgment normal.     Lab Results  Component Value Date   WBC 12.2 (H) 12/01/2021   HGB 12.2 (L) 12/01/2021   HCT 36.6 (L) 12/01/2021   PLT 173 12/01/2021   GLUCOSE 89 08/07/2022   CHOL 137 08/23/2022   TRIG 85 08/23/2022   HDL 36 (L) 08/23/2022   LDLDIRECT 88.1 01/23/2011   LDLCALC 85 08/23/2022   ALT 10 08/23/2022   AST 15 08/23/2022   NA 139 08/07/2022   K 4.1 08/07/2022   CL 106 08/07/2022   CREATININE 1.27 08/07/2022   BUN 12 08/07/2022   CO2 26 08/07/2022   TSH 0.70 08/07/2022   PSA 0.26 07/30/2020   INR 1.5 (H) 08/04/2020   HGBA1C 6.1 08/07/2022   MICROALBUR 2.0 (H)  03/29/2021    No results found.  Assessment & Plan:   Problem List Items Addressed This Visit     Diabetes mellitus type 2, controlled ([facility]) - Primary  Chronic On Metformin,  Jardiance since 07/2022 Hydrate well      Relevant Orders   Basic metabolic panel   Comprehensive metabolic panel   Hemoglobin A1c   Urinalysis   TSH   PSA   Microalbumin / creatinine urine ratio   B12 deficiency    Cont on Vit B12      Dyslipidemia (Chronic)    Chronic  Cont on Lipitor      Relevant Orders   Basic metabolic panel   Comprehensive metabolic panel   Hemoglobin A1c   Urinalysis   TSH   PSA   HTN (hypertension)    On Jardiance and Benazepril Hydrate well      Relevant Medications   metoprolol tartrate (LOPRESSOR) 25 MG tablet   Other Relevant Orders   Basic metabolic panel   Comprehensive metabolic panel   Hemoglobin A1c   Urinalysis   TSH   PSA   Low back pain    Tramadol, Zanaflex prn  Potential benefits of a long term opioids use as well as potential risks (i.e. addiction risk, apnea etc) and complications (i.e. Somnolence, constipation and others) were explained to the patient and were aknowledged. TENS unit for pain      Relevant Medications   traMADol (ULTRAM) 50 MG tablet      Meds ordered this encounter  Medications   DISCONTD: metoprolol tartrate (LOPRESSOR) 25 MG tablet    Sig: Take 0.5 tablets (12.5 mg total) by mouth daily. Can take an additional half tablet daily as needed    Dispense:  90 tablet    Refill:  1   traMADol (ULTRAM) 50 MG tablet    Sig: Take 1-2 tablets (50-100 mg total) by mouth every 6 (six) hours as needed for moderate pain or severe pain.    Dispense:  120 tablet    Refill:  3   metoprolol tartrate (LOPRESSOR) 25 MG tablet    Sig: Take 0.5 tablets (12.5 mg total) by mouth daily. Can take an additional half tablet daily as needed    Dispense:  90 tablet    Refill:  1      Follow-up: Return in about 3 months (around  05/07/2023) for a follow-up visit.  Sonda Primes, MD

## 2023-02-05 NOTE — Assessment & Plan Note (Signed)
Chronic ?Cont on Lipitor ?

## 2023-02-05 NOTE — Assessment & Plan Note (Signed)
Chronic On Metformin,  Jardiance since 07/2022 Hydrate well

## 2023-02-06 LAB — COMPREHENSIVE METABOLIC PANEL
ALT: 8 U/L (ref 0–53)
AST: 15 U/L (ref 0–37)
Albumin: 4 g/dL (ref 3.5–5.2)
Alkaline Phosphatase: 56 U/L (ref 39–117)
BUN: 14 mg/dL (ref 6–23)
CO2: 25 mEq/L (ref 19–32)
Calcium: 9.2 mg/dL (ref 8.4–10.5)
Chloride: 108 mEq/L (ref 96–112)
Creatinine, Ser: 1.43 mg/dL (ref 0.40–1.50)
GFR: 49.49 mL/min — ABNORMAL LOW (ref >60.00)
Glucose, Bld: 79 mg/dL (ref 70–99)
Potassium: 4.4 mEq/L (ref 3.5–5.1)
Sodium: 142 mEq/L (ref 135–145)
Total Bilirubin: 0.4 mg/dL (ref 0.2–1.2)
Total Protein: 6.7 g/dL (ref 6.0–8.3)

## 2023-02-12 ENCOUNTER — Ambulatory Visit: Payer: Medicare Other | Attending: Cardiovascular Disease | Admitting: Cardiovascular Disease

## 2023-02-12 ENCOUNTER — Encounter: Payer: Self-pay | Admitting: Cardiovascular Disease

## 2023-02-12 VITALS — BP 130/84 | HR 69 | Ht 75.0 in | Wt 261.6 lb

## 2023-02-12 DIAGNOSIS — I10 Essential (primary) hypertension: Secondary | ICD-10-CM | POA: Diagnosis not present

## 2023-02-12 DIAGNOSIS — E782 Mixed hyperlipidemia: Secondary | ICD-10-CM | POA: Diagnosis not present

## 2023-02-12 DIAGNOSIS — I2510 Atherosclerotic heart disease of native coronary artery without angina pectoris: Secondary | ICD-10-CM | POA: Diagnosis not present

## 2023-02-12 NOTE — Progress Notes (Signed)
Cardiology Office Note:    Date:  02/12/2023   ID:  Brian Mccullough, DOB 09-29-1951, MRN [id number]  PCP:  Tresa Garter, MD   [facility] Providers Cardiologist:  Tonny Bollman, MD Cardiology APP:  Kennon Rounds     Referring MD: Tresa Garter, MD   Chief Complaint  Patient presents with   Coronary Artery Disease    History of Present Illness:    Brian Mccullough is a 72 y.o. male with a hx of coronary artery disease, presenting for follow-up evaluation.  The patient was initially diagnosed with CAD in 2021 when he presented with an acute inferior STEMI and underwent primary angioplasty of the right coronary artery.  He was found to have severe multivessel disease at the time of his emergent catheterization and he ultimately was treated with 6 vessel CABG.  Comorbid conditions include hypertension, mixed hyperlipidemia, and type 2 diabetes.  The patient is here alone today.  Overall he is doing pretty well.  He complains of right arm pain and relates this to his statin drug.  He did have radial artery harvest from this arm, but when he has taken a statin holiday his symptoms have gone away.  He otherwise is doing well and denies chest pain or pressure, shortness of breath, or heart palpitations.  His grandson plays football at [employer] and he enjoys watching the games. He has not been exercising regularly and he has gained some weight.  States that he really would like to lose about 40 pounds to get his weight down to 220.  He denies orthopnea, PND, or leg swelling.   Currently he is taking atorvastatin 2 days weekly and Zetia on the other days.  Past Medical History:  Diagnosis Date   Allergy    CAD (coronary artery disease)    s/p Inf STEMI 10/21>>POBA to RCA >> s/p CABG (L-LAD, S-PDA, L Radial-OM1/RI, RIMA-RPLA) // post op AF; SVT >> Amiod Rx // Echo 10/21: EF 55-60, no RWMA, GLS -18.5, normal RVSF, mild LAE, trivial MR    Carotid artery  disease (HCC)    [place] 10/21: R 1-39; L 40-59   History of kidney stones    Hyperactive gag reflex    Hyperlipidemia    Hypertension    under control with meds., has been on med. x 30 yr.   Inguinal hernia 01/2014   bilateral   LBP (low back pain)    Non-insulin dependent type 2 diabetes mellitus (HCC)    OA (osteoarthritis) of knee 01/2004   left   Palpitations    s/p Inf STEMI 07/2020   POBA to RCA >> CABG   STEMI involving right coronary artery (HCC) 08/03/2020   Vitamin B 12 deficiency     Past Surgical History:  Procedure Laterality Date   CARDIAC CATHETERIZATION     CORONARY ARTERY BYPASS GRAFT N/A 08/04/2020   Procedure: CORONARY ARTERY BYPASS GRAFTING (CABG), ON PUMP, TIMES SIX, USING BILATERAL INTERNAL MAMMARY ARTERIES, RIGHT ENDOSCOPICALLY HARVESTED GREATER SAPHENOUS VEIN, AND RIGHT RADIAL ARTERY OPEN HARVEST;  Surgeon: Linden Dolin, MD;  Location: [facility] OR;  Service: Open Heart Surgery;  Laterality: N/A;   CORONARY/GRAFT ACUTE MI REVASCULARIZATION N/A 08/03/2020   Procedure: Coronary/Graft Acute MI Revascularization;  Surgeon: Tonny Bollman, MD;  Location: [facility] INVASIVE CV LAB;  Service: Cardiovascular;  Laterality: N/A;   ENDOVEIN HARVEST OF GREATER SAPHENOUS VEIN Right 08/04/2020   Procedure: ENDOVEIN HARVEST OF GREATER SAPHENOUS VEIN;  Surgeon: Vickey Sages,  Merri Brunette, MD;  Location: [facility] OR;  Service: Open Heart Surgery;  Laterality: Right;   INGUINAL HERNIA REPAIR  age [age]   INGUINAL HERNIA REPAIR Bilateral 02/09/2014   Procedure: OPEN BILATERAL INGUINAL HERNIA REPAIR WITH MESH;  Surgeon: Ernestene Mention, MD;  Location: [facility];  Service: General;  Laterality: Bilateral;   INSERTION OF MESH Bilateral 02/09/2014   Procedure: INSERTION OF MESH;  Surgeon: Ernestene Mention, MD;  Location:  SURGERY CENTER;  Service: General;  Laterality: Bilateral;   KNEE ARTHROSCOPY Right    LEFT HEART CATH AND CORONARY ANGIOGRAPHY N/A 08/03/2020   Procedure: LEFT HEART  CATH AND CORONARY ANGIOGRAPHY;  Surgeon: Tonny Bollman, MD;  Location: [facility] INVASIVE CV LAB;  Service: Cardiovascular;  Laterality: N/A;   ORCHIECTOMY Left 05/31/2017   Procedure: LEFT RADICAL ORCHIECTOMY;  Surgeon: Bjorn Pippin, MD;  Location: [facility];  Service: Urology;  Laterality: Left;   PROCTOSCOPY N/A 11/30/2021   Procedure: RIGID PROCTOSCOPY;  Surgeon: Karie Soda, MD;  Location: [facility]S;  Service: General;  Laterality: N/A;   RADIAL ARTERY HARVEST Right 08/04/2020   Procedure: RADIAL ARTERY HARVEST;  Surgeon: Linden Dolin, MD;  Location: [facility] OR;  Service: Open Heart Surgery;  Laterality: Right;   TEE WITHOUT CARDIOVERSION N/A 08/04/2020   Procedure: TRANSESOPHAGEAL ECHOCARDIOGRAM (TEE);  Surgeon: Linden Dolin, MD;  Location: [facility] OR;  Service: Open Heart Surgery;  Laterality: N/A;   TONSILLECTOMY AND ADENOIDECTOMY  1960   TOTAL KNEE ARTHROPLASTY Left 01/14/2004    Current Medications: Current Meds  Medication Sig   acetaminophen (TYLENOL) 500 MG tablet Take 1,000 mg by mouth every 6 (six) hours as needed for headache or moderate pain.   aspirin 81 MG EC tablet Take 81 mg by mouth daily.   atorvastatin (LIPITOR) 40 MG tablet Take 1 tablet (40 mg total) by mouth daily.   benazepril (LOTENSIN) 20 MG tablet Take 1 tablet (20 mg total) by mouth daily.   cholecalciferol (VITAMIN D3) 25 MCG (1000 UNIT) tablet Take 1,000 Units by mouth daily.   cyanocobalamin 1000 MCG tablet Take 1,000 mcg by mouth daily.   empagliflozin (JARDIANCE) 10 MG TABS tablet Take 1 tablet (10 mg total) by mouth daily.   ezetimibe (ZETIA) 10 MG tablet TAKE 1 TABLET BY MOUTH EVERY DAY   furosemide (LASIX) 20 MG tablet TAKE 1 TABLET BY MOUTH AS NEEDED FOR FLUID OR EDEMA. MAX 1 TABLET PER DAY.   loratadine (CLARITIN) 10 MG tablet Take 10 mg by mouth daily as needed for allergies or rhinitis.   meloxicam (MOBIC) 15 MG tablet Take 1 tablet (15 mg total) by mouth daily as needed. for pain    metFORMIN (GLUCOPHAGE) 500 MG tablet Take 1 tablet (500 mg total) by mouth daily with breakfast.   metoprolol tartrate (LOPRESSOR) 25 MG tablet Take 0.5 tablets (12.5 mg total) by mouth daily. Can take an additional half tablet daily as needed   tiZANidine (ZANAFLEX) 4 MG tablet Take 1 tablet (4 mg total) by mouth every 8 (eight) hours as needed for muscle spasms.   traMADol (ULTRAM) 50 MG tablet Take 1-2 tablets (50-100 mg total) by mouth every 6 (six) hours as needed for moderate pain or severe pain.   traZODone (DESYREL) 50 MG tablet TAKE 1/2 TO 1 TABLET BY  MOUTH AT BEDTIME AS NEEDED  FOR SLEEP   triamcinolone ointment (KENALOG) 0.1 % Apply 1 application. topically 2 (two) times daily as needed (rash).   [DISCONTINUED] clopidogrel (  PLAVIX) 75 MG tablet Take 75 mg by mouth daily.     Allergies:   Codeine and Levaquin [levofloxacin]   Social History   Socioeconomic History   Marital status: Married    Spouse name: Not on file   Number of children: Not on file   Years of education: 12   Highest education level: Not on file  Occupational History   Occupation: diabled    Employer: UNEMPLOYED    Comment: due to knees  Tobacco Use   Smoking status: Former    Types: Cigars    Quit date: 08/03/2020    Years since quitting: 2.5   Smokeless tobacco: Never   Tobacco comments:    2-3 small cigars/day  Vaping Use   Vaping Use: Never used  Substance and Sexual Activity   Alcohol use: No   Drug use: No   Sexual activity: Yes  Other Topics Concern   Not on file  Social History Narrative   Not on file   Social Determinants of Health   Financial Resource Strain: Low Risk  (10/25/2022)   Overall Financial Resource Strain (CARDIA)    Difficulty of Paying Living Expenses: Not hard at all  Food Insecurity: No Food Insecurity (10/25/2022)   Hunger Vital Sign    Worried About Running Out of Food in the Last Year: Never true    Ran Out of Food in the Last Year: Never true  Transportation  Needs: No Transportation Needs (10/25/2022)   PRAPARE - Administrator, Civil Service (Medical): No    Lack of Transportation (Non-Medical): No  Physical Activity: Sufficiently Active (10/25/2022)   Exercise Vital Sign    Days of Exercise per Week: 5 days    Minutes of Exercise per Session: 30 min  Stress: No Stress Concern Present (10/25/2022)   [employer] of Occupational Health - Occupational Stress Questionnaire    Feeling of Stress : Not at all  Social Connections: Moderately Isolated (10/25/2022)   Social Connection and Isolation Panel [NHANES]    Frequency of Communication with Friends and Family: More than three times a week    Frequency of Social Gatherings with Friends and Family: More than three times a week    Attends Religious Services: Never    Database administrator or Organizations: No    Attends Engineer, structural: Never    Marital Status: Married     Family History: The patient's family history includes Diabetes in his father and mother; Hypertension in his mother. There is no history of Colon cancer, Esophageal cancer, Rectal cancer, Stomach cancer, or Colon polyps.  ROS:   Please see the history of present illness.    All other systems reviewed and are negative.  EKGs/Labs/Other Studies Reviewed:    The following studies were reviewed today: Cardiac Studies & Procedures   CARDIAC CATHETERIZATION  CARDIAC CATHETERIZATION 08/04/2020  Narrative 1.  Acute inferior wall STEMI secondary to thrombotic occlusion of the RCA, treated successfully with balloon angioplasty alone 2.  Severe diffuse multivessel coronary artery disease with severe stenosis of the distal left main, ostial LAD, proximal LAD, ramus intermedius, proximal circumflex, and RCA as detailed above 3.  Mild segmental LV contraction abnormality with basal and mid inferior wall hypokinesis, LVEF estimated at 50 to 55%, mildly elevated LVEDP of 21 mmHg 4. Successful IABP  placement under fluoroscopic guidance  Recommendations: Continue intra-aortic balloon pump at one-to-one augmentation, continue tirofiban infusion, cardiac surgical evaluation tomorrow morning  Findings Coronary Findings  Diagnostic  Dominance: Right  Left Main Mid LM to Dist LM lesion is 70% stenosed. The distal left main trifurcates into the LAD, intermediate branch, and left circumflex with moderately severe distal left main stenosis  Left Anterior Descending Prox LAD to Mid LAD lesion is 80% stenosed. The lesion is calcified. There is severe diffuse proximal LAD stenosis.  The mid and distal LAD appear to be suitable targets for bypass grafting.  First Diagonal Branch 1st Diag lesion is 80% stenosed with 80% stenosed side branch in Lat 1st Diag. The lesion is moderately calcified.  Ramus Intermedius Ramus lesion is 75% stenosed.  Left Circumflex Prox Cx lesion is 70% stenosed.  Right Coronary Artery Prox RCA lesion is 95% stenosed. The lesion is heavily thrombotic. Culprit lesion with heavy thrombus Mid RCA lesion is 75% stenosed.  Right Posterior Descending Artery RPDA-1 lesion is 90% stenosed. Tandem severe 90 to 95% stenoses of the PDA are present in the distal PDA is 100% occluded. RPDA-2 lesion is 100% stenosed.  Intervention  Prox RCA lesion Angioplasty CATH LAUNCHER 6FR JR4 guide catheter was inserted. WIRE COUGAR XT STRL 190CM guidewire used to cross lesion. Balloon angioplasty was performed using a BALLOON SAPPHIRE 2.5X15. Maximum pressure: 8 atm. Inflation time: 30 sec. Post-Intervention Lesion Assessment The intervention was successful. Pre-interventional TIMI flow is 2. Post-intervention TIMI flow is 3. No complications occurred at this lesion. There is a 40% residual stenosis post intervention.   STRESS TESTS  EXERCISE TOLERANCE TEST (ETT) 08/23/2022  Narrative   Negative ECG stress test for ischemia.   Average exercise capacity (5:00 min:s; 7.0 METS).    Normal HR/BP response to exercise.   ECHOCARDIOGRAM  ECHOCARDIOGRAM COMPLETE 08/16/2021  Narrative ECHOCARDIOGRAM REPORT    Patient Name:   EESA TYSKA Date of Exam: 08/16/2021 Medical Rec #:  [id number]        Height:       75.0 in Accession #:    [id number]       Weight:       230.0 lb Date of Birth:  [birth year]        BSA:          2.329 m Patient Age:    70 years         BP:           136/75 mmHg Patient Gender: M                HR:           74 bpm. Exam Location:  Inpatient  Procedure: 2D Echo, Cardiac Doppler and Color Doppler  Indications:    CHF  History:        Patient has prior history of Echocardiogram examinations. Prior CABG; Risk Factors:Diabetes, Dyslipidemia and Hypertension.  Sonographer:    Cleatis Polka Referring Phys: [phone] AVA SWAYZE  IMPRESSIONS   1. Left ventricular ejection fraction, by estimation, is 60 to 65%. The left ventricle has normal function. The left ventricle has no regional wall motion abnormalities. Left ventricular diastolic parameters were normal. 2. Right ventricular systolic function is normal. The right ventricular size is normal. 3. Left atrial size was mildly dilated. 4. The mitral valve is normal in structure. No evidence of mitral valve regurgitation. No evidence of mitral stenosis. 5. The aortic valve is normal in structure. Aortic valve regurgitation is not visualized. No aortic stenosis is present. 6. The inferior vena cava is normal in size with greater than 50% respiratory variability, suggesting right atrial  pressure of 3 mmHg.  Comparison(s): No significant change from prior study. Prior images reviewed side by side.  FINDINGS Left Ventricle: Left ventricular ejection fraction, by estimation, is 60 to 65%. The left ventricle has normal function. The left ventricle has no regional wall motion abnormalities. The left ventricular internal cavity size was normal in size. There is no left ventricular hypertrophy. Abnormal  (paradoxical) septal motion consistent with post-operative status. Left ventricular diastolic parameters were normal.  Right Ventricle: The right ventricular size is normal. No increase in right ventricular wall thickness. Right ventricular systolic function is normal.  Left Atrium: Left atrial size was mildly dilated.  Right Atrium: Right atrial size was normal in size. Prominent Eustachian valve.  Pericardium: There is no evidence of pericardial effusion.  Mitral Valve: The mitral valve is normal in structure. No evidence of mitral valve regurgitation. No evidence of mitral valve stenosis.  Tricuspid Valve: The tricuspid valve is normal in structure. Tricuspid valve regurgitation is trivial. No evidence of tricuspid stenosis.  Aortic Valve: The aortic valve is normal in structure. Aortic valve regurgitation is not visualized. No aortic stenosis is present. Aortic valve peak gradient measures 6.6 mmHg.  Pulmonic Valve: The pulmonic valve was normal in structure. Pulmonic valve regurgitation is trivial. No evidence of pulmonic stenosis.  Aorta: The aortic root is normal in size and structure.  Venous: The inferior vena cava is normal in size with greater than 50% respiratory variability, suggesting right atrial pressure of 3 mmHg.  IAS/Shunts: No atrial level shunt detected by color flow Doppler.   LEFT VENTRICLE PLAX 2D LVIDd:         5.00 cm      Diastology LVIDs:         4.10 cm      LV e' medial:    5.22 cm/s LV PW:         1.10 cm      LV E/e' medial:  14.5 LV IVS:        1.20 cm      LV e' lateral:   11.00 cm/s LVOT diam:     2.50 cm      LV E/e' lateral: 6.9 LV SV:         105 LV SV Index:   45 LVOT Area:     4.91 cm  LV Volumes (MOD) LV vol d, MOD A2C: 175.0 ml LV vol d, MOD A4C: 184.0 ml LV vol s, MOD A2C: 80.0 ml LV vol s, MOD A4C: 79.5 ml LV SV MOD A2C:     95.0 ml LV SV MOD A4C:     184.0 ml LV SV MOD BP:      101.8 ml  RIGHT VENTRICLE            IVC RV  Basal diam:  4.60 cm    IVC diam: 1.90 cm RV Mid diam:    3.20 cm RV S prime:     8.38 cm/s TAPSE (M-mode): 1.9 cm  LEFT ATRIUM             Index        RIGHT ATRIUM           Index LA diam:        4.70 cm 2.02 cm/m   RA Area:     20.90 cm LA Vol (A2C):   77.8 ml 33.41 ml/m  RA Volume:   59.20 ml  25.42 ml/m LA Vol (A4C):   57.3 ml 24.61 ml/m  LA Biplane Vol: 67.6 ml 29.03 ml/m AORTIC VALVE AV Area (Vmax): 4.68 cm AV Vmax:        128.00 cm/s AV Peak Grad:   6.6 mmHg LVOT Vmax:      122.00 cm/s LVOT Vmean:     82.100 cm/s LVOT VTI:       0.213 m  AORTA Ao Root diam: 3.60 cm Ao Asc diam:  3.60 cm  MITRAL VALVE MV Area (PHT): 3.74 cm    SHUNTS MV Decel Time: 203 msec    Systemic VTI:  0.21 m MV E velocity: 75.80 cm/s  Systemic Diam: 2.50 cm MV A velocity: 68.30 cm/s MV E/A ratio:  1.11  Mihai Croitoru MD Electronically signed by Thurmon Fair MD Signature Date/Time: 08/16/2021/4:54:53 PM    Final   TEE  ECHO INTRAOPERATIVE TEE 08/16/2020  Narrative *INTRAOPERATIVE TRANSESOPHAGEAL REPORT *    Patient Name:   WANDELL FETTERMAN Date of Exam: 08/04/2020 Medical Rec #:  [id number]        Height:       76.0 in Accession #:    [id number]       Weight:       230.4 lb Date of Birth:  Oct 13, 1950        BSA:          2.35 m Patient Age:    69 years         BP:           110/82 mmHg Patient Gender: M                HR:           62 bpm. Exam Location:  Anesthesiology  Transesophogeal exam was perform intraoperatively during surgical procedure. Patient was closely monitored under general anesthesia during the entirety of examination.  Indications:     Coronary Artery Disease Sonographer:     Eulah Pont RDCS Performing Phys: Leslye Peer MD  Complications: No known complications during this procedure. POST-OP IMPRESSIONS - Left Ventricle: The left ventricle is unchanged from pre-bypass. - Right Ventricle: The right ventricle appears unchanged from  pre-bypass. - Aorta: The aorta appears unchanged from pre-bypass. - Left Atrium: The left atrium appears unchanged from pre-bypass. - Left Atrial Appendage: The left atrial appendage appears unchanged from pre-bypass. - Aortic Valve: The aortic valve appears unchanged from pre-bypass. - Mitral Valve: The mitral valve appears unchanged from pre-bypass. - Tricuspid Valve: The tricuspid valve appears unchanged from pre-bypass. - Interatrial Septum: The interatrial septum appears unchanged from pre-bypass. - Interventricular Septum: The interventricular septum appears unchanged from pre-bypass. - Pericardium: The pericardium appears unchanged from pre-bypass.  PRE-OP FINDINGS Left Ventricle: The left ventricle has low normal systolic function, with an ejection fraction of 50-55%. The cavity size was normal. There is mildly increased left ventricular wall thickness.  Right Ventricle: The right ventricle has normal systolic function. The cavity was normal. There is no increase in right ventricular wall thickness.  Left Atrium: Left atrial size was normal in size.  Right Atrium: Right atrial size was normal in size.  Interatrial Septum: No atrial level shunt detected by color flow Doppler. There is left bowing of the interatrial septum, suggestive of elevated right atrial pressure.  Pericardium: There is no evidence of pericardial effusion.  Mitral Valve: The mitral valve is normal in structure. Mitral valve regurgitation is trivial by color flow Doppler. There is No evidence of mitral stenosis.  Tricuspid Valve: The tricuspid valve was normal in structure.  Tricuspid valve regurgitation is trivial by color flow Doppler.  Aortic Valve: The aortic valve is tricuspid Aortic valve regurgitation was not visualized by color flow Doppler. There is no evidence of aortic valve stenosis.  Pulmonic Valve: The pulmonic valve was normal in structure. Pulmonic valve regurgitation is trivial by color flow  Doppler.   Aorta: IABP in place.   Leslye Peer MD Electronically signed by Leslye Peer MD Signature Date/Time: 08/06/2020/9:11:17 AM    Final             EKG:  EKG is not ordered today.    Recent Labs: 02/05/2023: ALT 8; BUN 14; Creatinine, Ser 1.43; Potassium 4.4; Sodium 142; TSH 0.60  Recent Lipid Panel    Component Value Date/Time   CHOL 137 08/23/2022 0907   TRIG 85 08/23/2022 0907   HDL 36 (L) 08/23/2022 0907   CHOLHDL 3.8 08/23/2022 0907   CHOLHDL 4.6 08/04/2020 0419   VLDL 14 08/04/2020 0419   LDLCALC 85 08/23/2022 0907   LDLDIRECT 88.1 01/23/2011 0829     Risk Assessment/Calculations:                Physical Exam:    VS:  BP 130/84   Pulse 69   Ht 6\' 3"  (1.905 m)   Wt 261 lb 9.6 oz (118.7 kg)   SpO2 97%   BMI 32.70 kg/m     Wt Readings from Last 3 Encounters:  02/12/23 261 lb 9.6 oz (118.7 kg)  02/05/23 256 lb (116.1 kg)  11/06/22 258 lb (117 kg)     GEN:  Well nourished, well developed in no acute distress HEENT: Normal NECK: No JVD; No carotid bruits LYMPHATICS: No lymphadenopathy CARDIAC: RRR, no murmurs, rubs, gallops RESPIRATORY:  Clear to auscultation without rales, wheezing or rhonchi  ABDOMEN: Soft, non-tender, non-distended MUSCULOSKELETAL:  No edema; No deformity  SKIN: Warm and dry NEUROLOGIC:  Alert and oriented x 3 PSYCHIATRIC:  Normal affect   ASSESSMENT:    1. Coronary artery disease involving native coronary artery of native heart without angina pectoris   2. Mixed hyperlipidemia   3. Primary hypertension    PLAN:    In order of problems listed above:  Stable without symptoms of angina.  Continue aspirin for antiplatelet therapy, metoprolol, and Jardiance. He is not tolerating his current therapy well.  His LDL cholesterol when last checked was greater than 70 mg/dL.  I am going to refer him to lipid clinic to consider a PCSK9 instead of a statin agent.  I think he would do better off of atorvastatin completely  and if we could reach a goal LDL cholesterol on a PCSK9 inhibitor I think this would be ideal. Blood pressure is controlled on a combination of benazepril and metoprolol.  He will work on diet and weight loss.  He has a goal weight of 220 pounds.     Medication Adjustments/Labs and Tests Ordered: Current medicines are reviewed at length with the patient today.  Concerns regarding medicines are outlined above.  Orders Placed This Encounter  Procedures   AMB Referral to [facility] Pharm-D   No orders of the defined types were placed in this encounter.   Patient Instructions  Medication Instructions:  Your physician recommends that you continue on your current medications as directed. Please refer to the Current Medication list given to you today.  *If you need a refill on your cardiac medications before your next appointment, please call your pharmacy*  Lab Work: NONE If you have  labs (blood work) drawn today and your tests are completely normal, you will receive your results only by: MyChart Message (if you have MyChart) OR A paper copy in the mail If you have any lab test that is abnormal or we need to change your treatment, we will call you to review the results.  Testing/Procedures: Ambulatory referral to PharmD (lipid clinic)  Follow-Up: At [facility], you and your health needs are our priority.  As part of our continuing mission to provide you with exceptional heart care, we have created designated Provider Care Teams.  These Care Teams include your primary Cardiologist (physician) and Advanced Practice Providers (APPs -  Physician Assistants and Nurse Practitioners) who all work together to provide you with the care you need, when you need it.  Your next appointment:   6 month(s)  Provider:   Robin Searing, NP     Then, Tonny Bollman, MD will plan to see you again in 1 year(s).       Signed, Tonny Bollman, MD  02/12/2023 2:51 PM    [facility]

## 2023-02-12 NOTE — Patient Instructions (Signed)
Medication Instructions:  Your physician recommends that you continue on your current medications as directed. Please refer to the Current Medication list given to you today.  *If you need a refill on your cardiac medications before your next appointment, please call your pharmacy*  Lab Work: NONE If you have labs (blood work) drawn today and your tests are completely normal, you will receive your results only by: MyChart Message (if you have MyChart) OR A paper copy in the mail If you have any lab test that is abnormal or we need to change your treatment, we will call you to review the results.  Testing/Procedures: Ambulatory referral to PharmD (lipid clinic)  Follow-Up: At [facility], you and your health needs are our priority.  As part of our continuing mission to provide you with exceptional heart care, we have created designated Provider Care Teams.  These Care Teams include your primary Cardiologist (physician) and Advanced Practice Providers (APPs -  Physician Assistants and Nurse Practitioners) who all work together to provide you with the care you need, when you need it.  Your next appointment:   6 month(s)  Provider:   Robin Searing, NP     Then, Tonny Bollman, MD will plan to see you again in 1 year(s).

## 2023-02-19 ENCOUNTER — Ambulatory Visit: Payer: Medicare Other | Attending: Nurse Practitioner | Admitting: Pharmacist

## 2023-02-19 ENCOUNTER — Encounter: Payer: Self-pay | Admitting: Pharmacist

## 2023-02-19 DIAGNOSIS — E785 Hyperlipidemia, unspecified: Secondary | ICD-10-CM

## 2023-02-19 DIAGNOSIS — I25110 Atherosclerotic heart disease of native coronary artery with unstable angina pectoris: Secondary | ICD-10-CM | POA: Diagnosis not present

## 2023-02-19 MED ORDER — ROSUVASTATIN CALCIUM 5 MG PO TABS: 5.0000 mg | ORAL_TABLET | Freq: Every day | ORAL | 3 refills | Status: DC

## 2023-02-19 NOTE — Patient Instructions (Addendum)
Your LDL goal is < 55  Stop taking atorvastatin  Start taking rosuvastatin 5mg  daily  Take ezetimibe 10mg  daily  Recheck fasting labs in 6 weeks on Friday, June 28th any time after 7:30am  If you have trouble tolerating this, call Laiya Wisby PharmD at #[phone] and we can try twice monthly Repatha injections

## 2023-02-19 NOTE — Progress Notes (Signed)
Patient ID: [name]                 DOB: Sep 07, 1951                    MRN: [id number]     HPI: Brian Mccullough is a 72 y.o. male patient referred to lipid clinic by Brian Mccullough. PMH is significant for CAD s/p acute inferior STEMI in 2021, underwent primary angioplasty of the RCA. Was found to have severe multivessel disease at the time of his cath and ultimately was treated with 6 vessel CABG. Also has history of HTN, HLD, and T2DM.  Last saw Brian Mccullough on 02/12/23. Reported right arm pain that he attributed to his statin. Did have radial artery harvest from this arm, but when he took a statin holiday his symptoms resolved. Was taking his atorvastatin 2 days a week and ezetimibe on the other days.   Pt presents today for follow up. Was prescribed atorvastatin 40mg  daily after his MI in 2021, has not tried other statins or lower dose of atorvastatin. He decreased his statin frequency to twice weekly last November after lipid panel was drawn, secondary to muscle pain in his thighs and shoulders bilaterally, and more pronounced in his right arm where his CABG harvest site was. At that time, he started taking ezetimibe 10mg  on days that he doesn't take atorvastatin. Still having some pain with this.   Current Medications: atorvastatin 40mg  2x/week, ezetimibe 10mg  5x/week Intolerances: atorvastatin 40mg  daily Risk Factors: CAD s/p STEMI and CABG, DM, HTN, age LDL goal: 55mg /dL  Family History: Diabetes in his father and mother; Hypertension in his mother   Social History: Former tobacco use, quit in 2021, no alcohol or drug use.  Labs: 08/23/22: TC 137, TG 85, HDL 36, LDL 85 - atorvastatin 40mg  daily  Past Medical History:  Diagnosis Date   Allergy    CAD (coronary artery disease)    s/p Inf STEMI 10/21>>POBA to RCA >> s/p CABG (L-LAD, S-PDA, L Radial-OM1/RI, RIMA-RPLA) // post op AF; SVT >> Amiod Rx // Echo 10/21: EF 55-60, no RWMA, GLS -18.5, normal RVSF, mild LAE, trivial MR     Carotid artery disease (HCC)    [place] 10/21: R 1-39; L 40-59   History of kidney stones    Hyperactive gag reflex    Hyperlipidemia    Hypertension    under control with meds., has been on med. x 30 yr.   Inguinal hernia 01/2014   bilateral   LBP (low back pain)    Non-insulin dependent type 2 diabetes mellitus (HCC)    OA (osteoarthritis) of knee 01/2004   left   Palpitations    s/p Inf STEMI 07/2020   POBA to RCA >> CABG   STEMI involving right coronary artery (HCC) 08/03/2020   Vitamin B 12 deficiency     Current Outpatient Medications on File Prior to Visit  Medication Sig Dispense Refill   acetaminophen (TYLENOL) 500 MG tablet Take 1,000 mg by mouth every 6 (six) hours as needed for headache or moderate pain.     aspirin 81 MG EC tablet Take 81 mg by mouth daily.     atorvastatin (LIPITOR) 40 MG tablet Take 1 tablet (40 mg total) by mouth daily. 100 tablet 3   benazepril (LOTENSIN) 20 MG tablet Take 1 tablet (20 mg total) by mouth daily. 90 tablet 3   cholecalciferol (VITAMIN D3) 25 MCG (1000 UNIT) tablet Take 1,000 Units  by mouth daily.     cyanocobalamin 1000 MCG tablet Take 1,000 mcg by mouth daily.     empagliflozin (JARDIANCE) 10 MG TABS tablet Take 1 tablet (10 mg total) by mouth daily. 30 tablet 11   ezetimibe (ZETIA) 10 MG tablet TAKE 1 TABLET BY MOUTH EVERY DAY 90 tablet 2   furosemide (LASIX) 20 MG tablet TAKE 1 TABLET BY MOUTH AS NEEDED FOR FLUID OR EDEMA. MAX 1 TABLET PER DAY. 100 tablet 3   loratadine (CLARITIN) 10 MG tablet Take 10 mg by mouth daily as needed for allergies or rhinitis.     meloxicam (MOBIC) 15 MG tablet Take 1 tablet (15 mg total) by mouth daily as needed. for pain 90 tablet 3   metFORMIN (GLUCOPHAGE) 500 MG tablet Take 1 tablet (500 mg total) by mouth daily with breakfast. 100 tablet 3   metoprolol tartrate (LOPRESSOR) 25 MG tablet Take 0.5 tablets (12.5 mg total) by mouth daily. Can take an additional half tablet daily as needed 90 tablet 1    nitroGLYCERIN (NITROSTAT) 0.4 MG SL tablet Place 1 tablet (0.4 mg total) under the tongue every 5 (five) minutes as needed for chest pain. 30 tablet 3   tiZANidine (ZANAFLEX) 4 MG tablet Take 1 tablet (4 mg total) by mouth every 8 (eight) hours as needed for muscle spasms. 90 tablet 0   traMADol (ULTRAM) 50 MG tablet Take 1-2 tablets (50-100 mg total) by mouth every 6 (six) hours as needed for moderate pain or severe pain. 120 tablet 3   traZODone (DESYREL) 50 MG tablet TAKE 1/2 TO 1 TABLET BY  MOUTH AT BEDTIME AS NEEDED  FOR SLEEP 90 tablet 3   triamcinolone ointment (KENALOG) 0.1 % Apply 1 application. topically 2 (two) times daily as needed (rash). 90 g 3   No current facility-administered medications on file prior to visit.    Allergies  Allergen Reactions   Codeine Nausea Only   Levaquin [Levofloxacin]     Can take it but feels achy    Assessment/Plan:  1. Hyperlipidemia - LDL 85 on atorvastatin 40mg  daily which he did not tolerate, above goal < 55 given CAD + DM. Will stop atorvastatin and start rosuvastatin 5mg  daily in 1 week - he will monitor for improvement in myalgias over the next week. Discussed that lower dose of hydrophilic statin is more likely to be tolerated better. He will increase his ezetimibe from 5 days a week to daily. Will recheck lipids at the end of June. If LDL above goal or pt doesn't tolerate rosuvastatin, discussed Repatha as a backup option which pt is open to trying.  Brian Mccullough E. Devaris Quirk, PharmD, BCACP, CPP [facility] [address], [place], [place] [address] Phone: [phone]; Fax: [phone] 02/19/2023 11:03 AM  Reviewed options for lowering LDL cholesterol, including statins, ezetimibe, PCSK9 inhibitors, Nexletol/Nexlizet, and Leqvio. Discussed mechanisms of action, dosing, side effects and potential decreases in LDL cholesterol. Also reviewed cost information and potential options for patient assistance.

## 2023-04-06 ENCOUNTER — Ambulatory Visit: Payer: Medicare Other | Attending: Cardiovascular Disease

## 2023-04-06 DIAGNOSIS — E785 Hyperlipidemia, unspecified: Secondary | ICD-10-CM

## 2023-04-07 LAB — HEPATIC FUNCTION PANEL
ALT: 7 IU/L (ref 0–44)
AST: 15 IU/L (ref 0–40)
Albumin: 4.2 g/dL (ref 3.8–4.8)
Alkaline Phosphatase: 60 IU/L (ref 44–121)
Bilirubin Total: 0.4 mg/dL (ref 0.0–1.2)
Bilirubin, Direct: 0.15 mg/dL (ref 0.00–0.40)
Total Protein: 6.9 g/dL (ref 6.0–8.5)

## 2023-04-07 LAB — LIPID PANEL
Chol/HDL Ratio: 2.9 ratio (ref 0.0–5.0)
Cholesterol, Total: 117 mg/dL (ref 100–199)
HDL: 41 mg/dL (ref >39)
LDL Chol Calc (NIH): 56 mg/dL (ref 0–99)
Triglycerides: 110 mg/dL (ref 0–149)
VLDL Cholesterol Cal: 20 mg/dL (ref 5–40)

## 2023-05-07 ENCOUNTER — Encounter: Payer: Self-pay | Admitting: Internal Medicine

## 2023-05-07 ENCOUNTER — Ambulatory Visit (INDEPENDENT_AMBULATORY_CARE_PROVIDER_SITE_OTHER): Payer: Medicare Other | Admitting: Internal Medicine

## 2023-05-07 VITALS — BP 124/82 | HR 62 | Temp 98.4°F | Ht 75.0 in | Wt 261.0 lb

## 2023-05-07 DIAGNOSIS — E785 Hyperlipidemia, unspecified: Secondary | ICD-10-CM | POA: Diagnosis not present

## 2023-05-07 DIAGNOSIS — E1165 Type 2 diabetes mellitus with hyperglycemia: Secondary | ICD-10-CM

## 2023-05-07 DIAGNOSIS — M2550 Pain in unspecified joint: Secondary | ICD-10-CM

## 2023-05-07 DIAGNOSIS — Z7984 Long term (current) use of oral hypoglycemic drugs: Secondary | ICD-10-CM

## 2023-05-07 DIAGNOSIS — G8929 Other chronic pain: Secondary | ICD-10-CM

## 2023-05-07 DIAGNOSIS — M25511 Pain in right shoulder: Secondary | ICD-10-CM

## 2023-05-07 LAB — COMPREHENSIVE METABOLIC PANEL WITH GFR
ALT: 8 U/L (ref 0–53)
AST: 12 U/L (ref 0–37)
Albumin: 4.1 g/dL (ref 3.5–5.2)
Alkaline Phosphatase: 52 U/L (ref 39–117)
BUN: 18 mg/dL (ref 6–23)
CO2: 24 meq/L (ref 19–32)
Calcium: 9.2 mg/dL (ref 8.4–10.5)
Chloride: 108 meq/L (ref 96–112)
Creatinine, Ser: 1.32 mg/dL (ref 0.40–1.50)
GFR: 54.38 mL/min — ABNORMAL LOW
Glucose, Bld: 84 mg/dL (ref 70–99)
Potassium: 4.3 meq/L (ref 3.5–5.1)
Sodium: 139 meq/L (ref 135–145)
Total Bilirubin: 0.5 mg/dL (ref 0.2–1.2)
Total Protein: 6.8 g/dL (ref 6.0–8.3)

## 2023-05-07 LAB — HEMOGLOBIN A1C: Hgb A1c MFr Bld: 6 % (ref 4.6–6.5)

## 2023-05-07 NOTE — Assessment & Plan Note (Signed)
joint pains on statins - 70 % better on Rosuvastatin and Zetia

## 2023-05-07 NOTE — Assessment & Plan Note (Addendum)
Pain started after the heart surgery Joint pains on statins - 70 % better on Rosuvastatin and Zetia, not the arm pain

## 2023-05-07 NOTE — Progress Notes (Signed)
Subjective:  Patient ID: Brian Mccullough, male    DOB: 09/21/1951  Age: 72 y.o. MRN: [id number]  CC: No chief complaint on file.   HPI Brian Mccullough presents for DM, HTN, OA C/o joint pains on statins - 70 % better on Rosuvastatin and Zetia C/o R shoulder pain/R arm pain  Outpatient Medications Prior to Visit  Medication Sig Dispense Refill   acetaminophen (TYLENOL) 500 MG tablet Take 1,000 mg by mouth every 6 (six) hours as needed for headache or moderate pain.     aspirin 81 MG EC tablet Take 81 mg by mouth daily.     benazepril (LOTENSIN) 20 MG tablet Take 1 tablet (20 mg total) by mouth daily. 90 tablet 3   cholecalciferol (VITAMIN D3) 25 MCG (1000 UNIT) tablet Take 1,000 Units by mouth daily.     cyanocobalamin 1000 MCG tablet Take 1,000 mcg by mouth daily.     empagliflozin (JARDIANCE) 10 MG TABS tablet Take 1 tablet (10 mg total) by mouth daily. 30 tablet 11   ezetimibe (ZETIA) 10 MG tablet TAKE 1 TABLET BY MOUTH EVERY DAY 90 tablet 2   furosemide (LASIX) 20 MG tablet TAKE 1 TABLET BY MOUTH AS NEEDED FOR FLUID OR EDEMA. MAX 1 TABLET PER DAY. 100 tablet 3   loratadine (CLARITIN) 10 MG tablet Take 10 mg by mouth daily as needed for allergies or rhinitis.     meloxicam (MOBIC) 15 MG tablet Take 1 tablet (15 mg total) by mouth daily as needed. for pain 90 tablet 3   metFORMIN (GLUCOPHAGE) 500 MG tablet Take 1 tablet (500 mg total) by mouth daily with breakfast. 100 tablet 3   metoprolol tartrate (LOPRESSOR) 25 MG tablet Take 0.5 tablets (12.5 mg total) by mouth daily. Can take an additional half tablet daily as needed 90 tablet 1   rosuvastatin (CRESTOR) 5 MG tablet Take 1 tablet (5 mg total) by mouth daily. 90 tablet 3   tiZANidine (ZANAFLEX) 4 MG tablet Take 1 tablet (4 mg total) by mouth every 8 (eight) hours as needed for muscle spasms. 90 tablet 0   traMADol (ULTRAM) 50 MG tablet Take 1-2 tablets (50-100 mg total) by mouth every 6 (six) hours as needed for moderate pain or  severe pain. 120 tablet 3   traZODone (DESYREL) 50 MG tablet TAKE 1/2 TO 1 TABLET BY  MOUTH AT BEDTIME AS NEEDED  FOR SLEEP 90 tablet 3   triamcinolone ointment (KENALOG) 0.1 % Apply 1 application. topically 2 (two) times daily as needed (rash). 90 g 3   nitroGLYCERIN (NITROSTAT) 0.4 MG SL tablet Place 1 tablet (0.4 mg total) under the tongue every 5 (five) minutes as needed for chest pain. 30 tablet 3   No facility-administered medications prior to visit.    ROS: Review of Systems  Constitutional:  Negative for appetite change, fatigue and unexpected weight change.  HENT:  Negative for congestion, nosebleeds, sneezing, sore throat and trouble swallowing.   Eyes:  Negative for itching and visual disturbance.  Respiratory:  Negative for cough.   Cardiovascular:  Negative for chest pain, palpitations and leg swelling.  Gastrointestinal:  Negative for abdominal distention, blood in stool, diarrhea and nausea.  Genitourinary:  Negative for frequency and hematuria.  Musculoskeletal:  Positive for arthralgias. Negative for back pain, gait problem, joint swelling and neck pain.  Skin:  Negative for rash.  Neurological:  Negative for dizziness, tremors, speech difficulty and weakness.  Psychiatric/Behavioral:  Negative for agitation, dysphoric mood and  sleep disturbance. The patient is not nervous/anxious.     Objective:  BP 124/82 (BP Location: Left Arm, Patient Position: Sitting, Cuff Size: Normal)   Pulse 62   Temp 98.4 F (36.9 C) (Oral)   Ht 6\' 3"  (1.905 m)   Wt 261 lb (118.4 kg)   SpO2 98%   BMI 32.62 kg/m   BP Readings from Last 3 Encounters:  05/07/23 124/82  02/12/23 130/84  02/05/23 118/78    Wt Readings from Last 3 Encounters:  05/07/23 261 lb (118.4 kg)  02/12/23 261 lb 9.6 oz (118.7 kg)  02/05/23 256 lb (116.1 kg)    Physical Exam Constitutional:      General: He is not in acute distress.    Appearance: He is well-developed.     Comments: NAD  Eyes:      Conjunctiva/sclera: Conjunctivae normal.     Pupils: Pupils are equal, round, and reactive to light.  Neck:     Thyroid: No thyromegaly.     Vascular: No JVD.  Cardiovascular:     Rate and Rhythm: Normal rate and regular rhythm.     Heart sounds: Normal heart sounds. No murmur heard.    No friction rub. No gallop.  Pulmonary:     Effort: Pulmonary effort is normal. No respiratory distress.     Breath sounds: Normal breath sounds. No wheezing or rales.  Chest:     Chest wall: No tenderness.  Abdominal:     General: Bowel sounds are normal. There is no distension.     Palpations: Abdomen is soft. There is no mass.     Tenderness: There is no abdominal tenderness. There is no guarding or rebound.  Musculoskeletal:        General: No tenderness. Normal range of motion.     Cervical back: Normal range of motion.  Lymphadenopathy:     Cervical: No cervical adenopathy.  Skin:    General: Skin is warm and dry.     Findings: No rash.  Neurological:     Mental Status: He is alert and oriented to person, place, and time.     Cranial Nerves: No cranial nerve deficit.     Motor: No abnormal muscle tone.     Coordination: Coordination normal.     Gait: Gait normal.     Deep Tendon Reflexes: Reflexes are normal and symmetric.  Psychiatric:        Behavior: Behavior normal.        Thought Content: Thought content normal.        Judgment: Judgment normal.     Lab Results  Component Value Date   WBC 12.2 (H) 12/01/2021   HGB 12.2 (L) 12/01/2021   HCT 36.6 (L) 12/01/2021   PLT 173 12/01/2021   GLUCOSE 79 02/05/2023   CHOL 117 04/06/2023   TRIG 110 04/06/2023   HDL 41 04/06/2023   LDLDIRECT 88.1 01/23/2011   LDLCALC 56 04/06/2023   ALT 7 04/06/2023   AST 15 04/06/2023   NA 142 02/05/2023   K 4.4 02/05/2023   CL 108 02/05/2023   CREATININE 1.43 02/05/2023   BUN 14 02/05/2023   CO2 25 02/05/2023   TSH 0.60 02/05/2023   PSA 0.27 02/05/2023   INR 1.5 (H) 08/04/2020   HGBA1C 6.1  02/05/2023   MICROALBUR 3.3 (H) 02/05/2023    No results found.  Assessment & Plan:   Problem List Items Addressed This Visit     Diabetes mellitus type 2, controlled (HCC)  joint pains on statins - 70 % better on Rosuvastatin and Zetia      Relevant Orders   Hemoglobin A1c   CBC with Differential/Platelet   Comprehensive metabolic panel   Hemoglobin A1c   Comprehensive metabolic panel   Dyslipidemia (Chronic)    joint pains on statins - 70 % better on Rosuvastatin and Zetia      Relevant Orders   Hemoglobin A1c   CBC with Differential/Platelet   Comprehensive metabolic panel   Arthralgia - Primary    joint pains on statins - 70 % better on Rosuvastatin and Zetia      Shoulder pain, right    Pain started after the heart surgery Joint pains on statins - 70 % better on Rosuvastatin and Zetia, not the arm pain         No orders of the defined types were placed in this encounter.     Follow-up: Return in about 3 months (around 08/07/2023) for a follow-up visit.  Sonda Primes, MD

## 2023-08-07 ENCOUNTER — Ambulatory Visit (INDEPENDENT_AMBULATORY_CARE_PROVIDER_SITE_OTHER): Payer: Medicare Other | Admitting: Internal Medicine

## 2023-08-07 ENCOUNTER — Encounter: Payer: Self-pay | Admitting: Internal Medicine

## 2023-08-07 VITALS — BP 110/70 | HR 52 | Temp 98.6°F | Ht 75.0 in | Wt 254.0 lb

## 2023-08-07 DIAGNOSIS — Z6830 Body mass index (BMI) 30.0-30.9, adult: Secondary | ICD-10-CM | POA: Diagnosis not present

## 2023-08-07 DIAGNOSIS — Z23 Encounter for immunization: Secondary | ICD-10-CM | POA: Diagnosis not present

## 2023-08-07 DIAGNOSIS — E1165 Type 2 diabetes mellitus with hyperglycemia: Secondary | ICD-10-CM

## 2023-08-07 DIAGNOSIS — Z7984 Long term (current) use of oral hypoglycemic drugs: Secondary | ICD-10-CM

## 2023-08-07 DIAGNOSIS — I10 Essential (primary) hypertension: Secondary | ICD-10-CM

## 2023-08-07 DIAGNOSIS — E66811 Obesity, class 1: Secondary | ICD-10-CM

## 2023-08-07 LAB — COMPREHENSIVE METABOLIC PANEL
ALT: 7 U/L (ref 0–53)
AST: 13 U/L (ref 0–37)
Albumin: 4.2 g/dL (ref 3.5–5.2)
Alkaline Phosphatase: 55 U/L (ref 39–117)
BUN: 16 mg/dL (ref 6–23)
CO2: 26 meq/L (ref 19–32)
Calcium: 9.5 mg/dL (ref 8.4–10.5)
Chloride: 106 meq/L (ref 96–112)
Creatinine, Ser: 1.45 mg/dL (ref 0.40–1.50)
GFR: 48.5 mL/min — ABNORMAL LOW (ref >60.00)
Glucose, Bld: 87 mg/dL (ref 70–99)
Potassium: 4.6 meq/L (ref 3.5–5.1)
Sodium: 139 meq/L (ref 135–145)
Total Bilirubin: 0.5 mg/dL (ref 0.2–1.2)
Total Protein: 7.1 g/dL (ref 6.0–8.3)

## 2023-08-07 LAB — HEMOGLOBIN A1C: Hgb A1c MFr Bld: 6 % (ref 4.6–6.5)

## 2023-08-07 NOTE — Assessment & Plan Note (Signed)
Chronic On Metformin,  Jardiance since 07/2022 Hydrate well joint pains on statins - 70 % better on Rosuvastatin and Zetia

## 2023-08-07 NOTE — Progress Notes (Signed)
Subjective:  Patient ID: Brian Mccullough, male    DOB: 01/06/1951  Age: 72 y.o. MRN: [id number]  CC: Medical Management of Chronic Issues (3 MNTH F/U, discuss weight loss medication)   HPI Brian Mccullough presents for DM, obesity, HTN  Outpatient Medications Prior to Visit  Medication Sig Dispense Refill   acetaminophen (TYLENOL) 500 MG tablet Take 1,000 mg by mouth every 6 (six) hours as needed for headache or moderate pain.     aspirin 81 MG EC tablet Take 81 mg by mouth daily.     benazepril (LOTENSIN) 20 MG tablet Take 1 tablet (20 mg total) by mouth daily. 90 tablet 3   cholecalciferol (VITAMIN D3) 25 MCG (1000 UNIT) tablet Take 1,000 Units by mouth daily.     cyanocobalamin 1000 MCG tablet Take 1,000 mcg by mouth daily.     empagliflozin (JARDIANCE) 10 MG TABS tablet Take 1 tablet (10 mg total) by mouth daily. 30 tablet 11   ezetimibe (ZETIA) 10 MG tablet TAKE 1 TABLET BY MOUTH EVERY DAY 90 tablet 2   furosemide (LASIX) 20 MG tablet TAKE 1 TABLET BY MOUTH AS NEEDED FOR FLUID OR EDEMA. MAX 1 TABLET PER DAY. 100 tablet 3   loratadine (CLARITIN) 10 MG tablet Take 10 mg by mouth daily as needed for allergies or rhinitis.     meloxicam (MOBIC) 15 MG tablet Take 1 tablet (15 mg total) by mouth daily as needed. for pain 90 tablet 3   metFORMIN (GLUCOPHAGE) 500 MG tablet Take 1 tablet (500 mg total) by mouth daily with breakfast. 100 tablet 3   metoprolol tartrate (LOPRESSOR) 25 MG tablet Take 0.5 tablets (12.5 mg total) by mouth daily. Can take an additional half tablet daily as needed 90 tablet 1   rosuvastatin (CRESTOR) 5 MG tablet Take 1 tablet (5 mg total) by mouth daily. 90 tablet 3   tiZANidine (ZANAFLEX) 4 MG tablet Take 1 tablet (4 mg total) by mouth every 8 (eight) hours as needed for muscle spasms. 90 tablet 0   traMADol (ULTRAM) 50 MG tablet Take 1-2 tablets (50-100 mg total) by mouth every 6 (six) hours as needed for moderate pain or severe pain. 120 tablet 3   traZODone  (DESYREL) 50 MG tablet TAKE 1/2 TO 1 TABLET BY  MOUTH AT BEDTIME AS NEEDED  FOR SLEEP 90 tablet 3   triamcinolone ointment (KENALOG) 0.1 % Apply 1 application. topically 2 (two) times daily as needed (rash). 90 g 3   nitroGLYCERIN (NITROSTAT) 0.4 MG SL tablet Place 1 tablet (0.4 mg total) under the tongue every 5 (five) minutes as needed for chest pain. 30 tablet 3   No facility-administered medications prior to visit.    ROS: Review of Systems  Constitutional:  Negative for appetite change, fatigue and unexpected weight change.  HENT:  Negative for congestion, nosebleeds, sneezing, sore throat and trouble swallowing.   Eyes:  Negative for itching and visual disturbance.  Respiratory:  Negative for cough.   Cardiovascular:  Negative for chest pain, palpitations and leg swelling.  Gastrointestinal:  Negative for abdominal distention, blood in stool, diarrhea and nausea.  Genitourinary:  Negative for frequency and hematuria.  Musculoskeletal:  Negative for back pain, gait problem, joint swelling and neck pain.  Skin:  Negative for rash.  Neurological:  Negative for dizziness, tremors, speech difficulty and weakness.  Psychiatric/Behavioral:  Negative for agitation, dysphoric mood and sleep disturbance. The patient is not nervous/anxious.     Objective:  BP 110/70 (  BP Location: Left Arm, Patient Position: Sitting, Cuff Size: Normal)   Pulse (!) 52   Temp 98.6 F (37 C) (Oral)   Ht 6\' 3"  (1.905 m)   Wt 254 lb (115.2 kg)   SpO2 98%   BMI 31.75 kg/m   BP Readings from Last 3 Encounters:  08/07/23 110/70  05/07/23 124/82  02/12/23 130/84    Wt Readings from Last 3 Encounters:  08/07/23 254 lb (115.2 kg)  05/07/23 261 lb (118.4 kg)  02/12/23 261 lb 9.6 oz (118.7 kg)    Physical Exam  Lab Results  Component Value Date   WBC 12.2 (H) 12/01/2021   HGB 12.2 (L) 12/01/2021   HCT 36.6 (L) 12/01/2021   PLT 173 12/01/2021   GLUCOSE 84 05/07/2023   CHOL 117 04/06/2023   TRIG 110  04/06/2023   HDL 41 04/06/2023   LDLDIRECT 88.1 01/23/2011   LDLCALC 56 04/06/2023   ALT 8 05/07/2023   AST 12 05/07/2023   NA 139 05/07/2023   K 4.3 05/07/2023   CL 108 05/07/2023   CREATININE 1.32 05/07/2023   BUN 18 05/07/2023   CO2 24 05/07/2023   TSH 0.60 02/05/2023   PSA 0.27 02/05/2023   INR 1.5 (H) 08/04/2020   HGBA1C 6.0 05/07/2023   MICROALBUR 3.3 (H) 02/05/2023    No results found.  Assessment & Plan:   Problem List Items Addressed This Visit     Diabetes mellitus type 2, controlled (HCC) - Primary    Chronic On Metformin,  Jardiance since 07/2022 Hydrate well joint pains on statins - 70 % better on Rosuvastatin and Zetia      Obesity    Ideal wt 220 lbs - pt's goal Ozempic option was discussed      HTN (hypertension)    On Jardiance and Benazepril Hydrate well         No orders of the defined types were placed in this encounter.     Follow-up: Return in about 3 months (around 11/07/2023) for a follow-up visit.  Sonda Primes, MD

## 2023-08-07 NOTE — Patient Instructions (Addendum)
Jardiance or [employer]

## 2023-08-07 NOTE — Addendum Note (Signed)
Addended by: Delsa Grana R on: 08/07/2023 12:03 PM   Modules accepted: Orders

## 2023-08-07 NOTE — Assessment & Plan Note (Signed)
Ideal wt 220 lbs - pt's goal Ozempic option was discussed

## 2023-08-07 NOTE — Assessment & Plan Note (Signed)
On Jardiance and Benazepril Hydrate well

## 2023-08-12 ENCOUNTER — Other Ambulatory Visit: Payer: Self-pay | Admitting: Internal Medicine

## 2023-08-12 NOTE — Progress Notes (Unsigned)
Cardiology Office Note    Patient Name: Brian Mccullough Date of Encounter: 08/12/2023  Primary Care Provider:  Tresa Garter, MD Primary Cardiologist:  Tonny Bollman, MD Primary Electrophysiologist: None   Past Medical History    Past Medical History:  Diagnosis Date   Allergy    CAD (coronary artery disease)    s/p Inf STEMI 10/21>>POBA to RCA >> s/p CABG (L-LAD, S-PDA, L Radial-OM1/RI, RIMA-RPLA) // post op AF; SVT >> Amiod Rx // Echo 10/21: EF 55-60, no RWMA, GLS -18.5, normal RVSF, mild LAE, trivial MR    Carotid artery disease (HCC)    [place] 10/21: R 1-39; L 40-59   History of kidney stones    Hyperactive gag reflex    Hyperlipidemia    Hypertension    under control with meds., has been on med. x 30 yr.   Inguinal hernia 01/2014   bilateral   LBP (low back pain)    Non-insulin dependent type 2 diabetes mellitus (HCC)    OA (osteoarthritis) of knee 01/2004   left   Palpitations    s/p Inf STEMI 07/2020   POBA to RCA >> CABG   STEMI involving right coronary artery (HCC) 08/03/2020   Vitamin B 12 deficiency     History of Present Illness  Brian Mccullough is a 72 y.o. male with PMH of CAD s/p inferior STEMI 2021 with angioplasty to RCA and CABG x6, carotid artery disease (right 1-39%, left 40-59%), HTN, HLD, DM type II, SVT who presents today for 50-month follow-up of coronary artery disease.   Ms. Mahoney was last seen by Dr. Excell Seltzer on 02/12/2023 for follow-up.  During visit patient had gained weight and was interested in losing 40 pounds.  He was overall doing well with no chest pain and was referred to the Pharm.D. for consideration of PCSK9 with LDL not at goal.  He was found to have controlled BP was seen in clinic on 02/19/2023.  He was switched from atorvastatin to rosuvastatin was increased to 5 days/week.  That was discussed as a backup option in the future.   During today's visit the patient reports*** .  Patient denies chest pain, palpitations, dyspnea, PND,  orthopnea, nausea, vomiting, dizziness, syncope, edema, weight gain, or early satiety.  ***Notes: -Last ischemic evaluation: -Last echo: -Interim ED visits: Review of Systems  Please see the history of present illness.    All other systems reviewed and are otherwise negative except as noted above.  Physical Exam    Wt Readings from Last 3 Encounters:  08/07/23 254 lb (115.2 kg)  05/07/23 261 lb (118.4 kg)  02/12/23 261 lb 9.6 oz (118.7 kg)   OV:FIEPP were no vitals filed for this visit.,There is no height or weight on file to calculate BMI. GEN: Well nourished, well developed in no acute distress Neck: No JVD; No carotid bruits Pulmonary: Clear to auscultation without rales, wheezing or rhonchi  Cardiovascular: Normal rate. Regular rhythm. Normal S1. Normal S2.   Murmurs: There is no murmur.  ABDOMEN: Soft, non-tender, non-distended EXTREMITIES:  No edema; No deformity   EKG/LABS/ Recent Cardiac Studies   ECG personally reviewed by me today - ***  Risk Assessment/Calculations:   {Does this patient have ATRIAL FIBRILLATION?:[phone]}      Lab Results  Component Value Date   WBC 12.2 (H) 12/01/2021   HGB 12.2 (L) 12/01/2021   HCT 36.6 (L) 12/01/2021   MCV 87.1 12/01/2021   PLT 173 12/01/2021   Lab Results  Component Value Date   CREATININE 1.45 08/07/2023   BUN 16 08/07/2023   NA 139 08/07/2023   K 4.6 08/07/2023   CL 106 08/07/2023   CO2 26 08/07/2023   Lab Results  Component Value Date   CHOL 117 04/06/2023   HDL 41 04/06/2023   LDLCALC 56 04/06/2023   LDLDIRECT 88.1 01/23/2011   TRIG 110 04/06/2023   CHOLHDL 2.9 04/06/2023    Lab Results  Component Value Date   HGBA1C 6.0 08/07/2023   Assessment & Plan    1.  Coronary artery disease: -s/p CABG x6 in 2021 and today reports*** -Continue current GDMT with ASA 81 mg, Lipitor 40 mg Jardiance 10 mg, metoprolol 12.5 mg twice daily   2.  Essential hypertension: -Patient's blood pressure today  was***  3.  Carotid artery disease: -Carotid Dopplers completed in 07/2020 showing right 1-39%, left 40-59%    4.  DM type II: -Patient's last hemoglobin A1c was***  5.  Hyperlipidemia: -Patient's last LDL was 56 at goal on Crestor and ezetimibe  Disposition: Follow-up with Tonny Bollman, MD or APP in *** months {Are you ordering a CV Procedure (e.g. stress test, cath, DCCV, TEE, etc)?   Press F2        :[id number]}   Signed, Napoleon Form, Leodis Rains, NP 08/12/2023, 3:23 PM [facility]

## 2023-08-13 ENCOUNTER — Encounter: Payer: Self-pay | Admitting: Nurse Practitioner

## 2023-08-13 ENCOUNTER — Ambulatory Visit: Payer: Medicare Other | Attending: Nurse Practitioner | Admitting: Nurse Practitioner

## 2023-08-13 VITALS — BP 108/66 | HR 58 | Ht 75.0 in | Wt 254.6 lb

## 2023-08-13 DIAGNOSIS — E6609 Other obesity due to excess calories: Secondary | ICD-10-CM

## 2023-08-13 DIAGNOSIS — I10 Essential (primary) hypertension: Secondary | ICD-10-CM | POA: Diagnosis not present

## 2023-08-13 DIAGNOSIS — E1165 Type 2 diabetes mellitus with hyperglycemia: Secondary | ICD-10-CM | POA: Diagnosis not present

## 2023-08-13 DIAGNOSIS — I25110 Atherosclerotic heart disease of native coronary artery with unstable angina pectoris: Secondary | ICD-10-CM

## 2023-08-13 DIAGNOSIS — I6523 Occlusion and stenosis of bilateral carotid arteries: Secondary | ICD-10-CM | POA: Diagnosis not present

## 2023-08-13 DIAGNOSIS — E66811 Obesity, class 1: Secondary | ICD-10-CM

## 2023-08-13 DIAGNOSIS — E782 Mixed hyperlipidemia: Secondary | ICD-10-CM | POA: Diagnosis not present

## 2023-08-13 DIAGNOSIS — Z6831 Body mass index (BMI) 31.0-31.9, adult: Secondary | ICD-10-CM

## 2023-08-13 NOTE — Patient Instructions (Signed)
Medication Instructions:  Your physician recommends that you continue on your current medications as directed. Please refer to the Current Medication list given to you today. *If you need a refill on your cardiac medications before your next appointment, please call your pharmacy*   Lab Work: None ordered   Testing/Procedures: None ordered   Follow-Up: At [facility], you and your health needs are our priority.  As part of our continuing mission to provide you with exceptional heart care, we have created designated Provider Care Teams.  These Care Teams include your primary Cardiologist (physician) and Advanced Practice Providers (APPs -  Physician Assistants and Nurse Practitioners) who all work together to provide you with the care you need, when you need it.  We recommend signing up for the patient portal called "MyChart".  Sign up information is provided on this After Visit Summary.  MyChart is used to connect with patients for Virtual Visits (Telemedicine).  Patients are able to view lab/test results, encounter notes, upcoming appointments, etc.  Non-urgent messages can be sent to your provider as well.   To learn more about what you can do with MyChart, go to [email or web address].    Your next appointment:   6 month(s)  Provider:   Tonny Bollman, MD  or Robin Searing, NP   Other Instructions Follow up with your PCP about nephrology

## 2023-08-16 ENCOUNTER — Other Ambulatory Visit: Payer: Self-pay | Admitting: Internal Medicine

## 2023-08-29 ENCOUNTER — Other Ambulatory Visit: Payer: Self-pay | Admitting: Internal Medicine

## 2023-09-26 ENCOUNTER — Other Ambulatory Visit: Payer: Self-pay | Admitting: Internal Medicine

## 2023-10-13 ENCOUNTER — Other Ambulatory Visit: Payer: Self-pay | Admitting: Nurse Practitioner

## 2023-10-13 ENCOUNTER — Other Ambulatory Visit: Payer: Self-pay | Admitting: Internal Medicine

## 2023-11-12 ENCOUNTER — Encounter: Payer: Self-pay | Admitting: Internal Medicine

## 2023-11-12 ENCOUNTER — Ambulatory Visit (INDEPENDENT_AMBULATORY_CARE_PROVIDER_SITE_OTHER): Payer: Medicare Other | Admitting: Internal Medicine

## 2023-11-12 VITALS — BP 118/70 | HR 53 | Temp 98.3°F | Ht 75.0 in | Wt 259.0 lb

## 2023-11-12 DIAGNOSIS — E538 Deficiency of other specified B group vitamins: Secondary | ICD-10-CM | POA: Diagnosis not present

## 2023-11-12 DIAGNOSIS — E1165 Type 2 diabetes mellitus with hyperglycemia: Secondary | ICD-10-CM

## 2023-11-12 DIAGNOSIS — N183 Chronic kidney disease, stage 3 unspecified: Secondary | ICD-10-CM | POA: Diagnosis not present

## 2023-11-12 DIAGNOSIS — Z7984 Long term (current) use of oral hypoglycemic drugs: Secondary | ICD-10-CM | POA: Diagnosis not present

## 2023-11-12 DIAGNOSIS — I25110 Atherosclerotic heart disease of native coronary artery with unstable angina pectoris: Secondary | ICD-10-CM | POA: Diagnosis not present

## 2023-11-12 MED ORDER — OZEMPIC (0.25 OR 0.5 MG/DOSE) 2 MG/3ML ~~LOC~~ SOPN: PEN_INJECTOR | SUBCUTANEOUS | 2 refills | Status: DC

## 2023-11-12 NOTE — Assessment & Plan Note (Signed)
Cont on Vit B12 

## 2023-11-12 NOTE — Progress Notes (Signed)
Subjective:  Patient ID: Brian Mccullough, male    DOB: 11-18-1950  Age: 73 y.o. MRN: [id number]  CC: Medical Management of Chronic Issues (3 MNTH F/U)   HPI Brian Mccullough presents for CAD, HTN, dyslipidemia  Outpatient Medications Prior to Visit  Medication Sig Dispense Refill   acetaminophen (TYLENOL) 500 MG tablet Take 1,000 mg by mouth every 6 (six) hours as needed for headache or moderate pain.     aspirin 81 MG EC tablet Take 81 mg by mouth daily.     atorvastatin (LIPITOR) 40 MG tablet Take 40 mg by mouth daily.     benazepril (LOTENSIN) 20 MG tablet TAKE 1 TABLET BY MOUTH DAILY 100 tablet 2   cholecalciferol (VITAMIN D3) 25 MCG (1000 UNIT) tablet Take 1,000 Units by mouth daily.     cyanocobalamin 1000 MCG tablet Take 1,000 mcg by mouth daily.     ezetimibe (ZETIA) 10 MG tablet TAKE 1 TABLET BY MOUTH EVERY DAY 90 tablet 3   furosemide (LASIX) 20 MG tablet TAKE 1 TABLET BY MOUTH AS NEEDED FOR FLUID OR EDEMA. MAX 1 TABLET PER DAY. 100 tablet 3   JARDIANCE 10 MG TABS tablet TAKE 1 TABLET BY MOUTH EVERY DAY 30 tablet 11   loratadine (CLARITIN) 10 MG tablet Take 10 mg by mouth daily as needed for allergies or rhinitis.     meloxicam (MOBIC) 15 MG tablet TAKE 1 TABLET BY MOUTH DAILY AS  NEEDED FOR PAIN 100 tablet 1   metFORMIN (GLUCOPHAGE) 500 MG tablet Take 1 tablet (500 mg total) by mouth daily with breakfast. 100 tablet 3   metoprolol tartrate (LOPRESSOR) 25 MG tablet TAKE 0.5 TABLETS (12.5 MG TOTAL) BY MOUTH DAILY. CAN TAKE AN ADDITIONAL HALF TABLET DAILY AS NEEDED 90 tablet 1   rosuvastatin (CRESTOR) 5 MG tablet Take 1 tablet (5 mg total) by mouth daily. 90 tablet 3   tiZANidine (ZANAFLEX) 4 MG tablet Take 1 tablet (4 mg total) by mouth every 8 (eight) hours as needed for muscle spasms. 90 tablet 0   traMADol (ULTRAM) 50 MG tablet TAKE 1-2 TABLETS (50-100 MG TOTAL) BY MOUTH EVERY 6 (SIX) HOURS AS NEEDED FOR MODERATE PAIN OR SEVERE PAIN. 120 tablet 1   traZODone (DESYREL) 50 MG  tablet TAKE 1/2 TO 1 TABLET BY  MOUTH AT BEDTIME AS NEEDED  FOR SLEEP 90 tablet 3   triamcinolone ointment (KENALOG) 0.1 % Apply 1 application. topically 2 (two) times daily as needed (rash). 90 g 3   nitroGLYCERIN (NITROSTAT) 0.4 MG SL tablet Place 1 tablet (0.4 mg total) under the tongue every 5 (five) minutes as needed for chest pain. 30 tablet 3   No facility-administered medications prior to visit.    ROS: Review of Systems  Constitutional:  Negative for appetite change, fatigue and unexpected weight change.  HENT:  Negative for congestion, nosebleeds, sneezing, sore throat and trouble swallowing.   Eyes:  Negative for itching and visual disturbance.  Respiratory:  Negative for cough.   Cardiovascular:  Negative for chest pain, palpitations and leg swelling.  Gastrointestinal:  Negative for abdominal distention, blood in stool, diarrhea and nausea.  Genitourinary:  Negative for frequency and hematuria.  Musculoskeletal:  Negative for back pain, gait problem, joint swelling and neck pain.  Skin:  Negative for rash.  Neurological:  Negative for dizziness, tremors, speech difficulty and weakness.  Psychiatric/Behavioral:  Negative for agitation, dysphoric mood and sleep disturbance. The patient is not nervous/anxious.     Objective:  BP 118/70 (BP Location: Left Arm, Patient Position: Sitting, Cuff Size: Normal)   Pulse (!) 53   Temp 98.3 F (36.8 C) (Oral)   Ht 6\' 3"  (1.905 m)   Wt 259 lb (117.5 kg)   SpO2 97%   BMI 32.37 kg/m   BP Readings from Last 3 Encounters:  11/12/23 118/70  08/13/23 108/66  08/07/23 110/70    Wt Readings from Last 3 Encounters:  11/12/23 259 lb (117.5 kg)  08/13/23 254 lb 9.6 oz (115.5 kg)  08/07/23 254 lb (115.2 kg)    Physical Exam Constitutional:      General: He is not in acute distress.    Appearance: He is well-developed. He is obese.     Comments: NAD  Eyes:     Conjunctiva/sclera: Conjunctivae normal.     Pupils: Pupils are  equal, round, and reactive to light.  Neck:     Thyroid: No thyromegaly.     Vascular: No JVD.  Cardiovascular:     Rate and Rhythm: Normal rate and regular rhythm.     Heart sounds: Normal heart sounds. No murmur heard.    No friction rub. No gallop.  Pulmonary:     Effort: Pulmonary effort is normal. No respiratory distress.     Breath sounds: Normal breath sounds. No wheezing or rales.  Chest:     Chest wall: No tenderness.  Abdominal:     General: Bowel sounds are normal. There is no distension.     Palpations: Abdomen is soft. There is no mass.     Tenderness: There is no abdominal tenderness. There is no guarding or rebound.  Musculoskeletal:        General: No tenderness. Normal range of motion.     Cervical back: Normal range of motion.  Lymphadenopathy:     Cervical: No cervical adenopathy.  Skin:    General: Skin is warm and dry.     Findings: No rash.  Neurological:     Mental Status: He is alert and oriented to person, place, and time.     Cranial Nerves: No cranial nerve deficit.     Motor: No abnormal muscle tone.     Coordination: Coordination normal.     Gait: Gait normal.     Deep Tendon Reflexes: Reflexes are normal and symmetric.  Psychiatric:        Behavior: Behavior normal.        Thought Content: Thought content normal.        Judgment: Judgment normal.     Lab Results  Component Value Date   WBC 12.2 (H) 12/01/2021   HGB 12.2 (L) 12/01/2021   HCT 36.6 (L) 12/01/2021   PLT 173 12/01/2021   GLUCOSE 87 08/07/2023   CHOL 117 04/06/2023   TRIG 110 04/06/2023   HDL 41 04/06/2023   LDLDIRECT 88.1 01/23/2011   LDLCALC 56 04/06/2023   ALT 7 08/07/2023   AST 13 08/07/2023   NA 139 08/07/2023   K 4.6 08/07/2023   CL 106 08/07/2023   CREATININE 1.45 08/07/2023   BUN 16 08/07/2023   CO2 26 08/07/2023   TSH 0.60 02/05/2023   PSA 0.27 02/05/2023   INR 1.5 (H) 08/04/2020   HGBA1C 6.0 08/07/2023   MICROALBUR 3.3 (H) 02/05/2023    No results  found.  Assessment & Plan:   Problem List Items Addressed This Visit     Diabetes mellitus type 2, controlled (HCC) - Primary   Chronic On Metformin,  Jardiance  since 07/2022 Hydrate well joint pains on statins - 70 % better on Rosuvastatin and Zetia Start Ozempic      Relevant Medications   atorvastatin (LIPITOR) 40 MG tablet   Semaglutide,0.25 or 0.5MG /DOS, (OZEMPIC, 0.25 OR 0.5 MG/DOSE,) 2 MG/3ML SOPN   Other Relevant Orders   Hemoglobin A1c   Comprehensive metabolic panel   W09 deficiency   Cont on Vit B12      Relevant Orders   Hemoglobin A1c   Coronary artery disease   Cont w/ current meds including aspirin, Plavix, atorvastatin, metoprolol, benazepril, furosemide Consider PITAVASTATIN      Relevant Medications   atorvastatin (LIPITOR) 40 MG tablet   CRI (chronic renal insufficiency), stage 3 (moderate) (HCC)   GFR 48-53 Multifactorial Hydrate well On Jardiance           Meds ordered this encounter  Medications   Semaglutide,0.25 or 0.5MG /DOS, (OZEMPIC, 0.25 OR 0.5 MG/DOSE,) 2 MG/3ML SOPN    Sig: Use 0.25 mg weekly sq for 1 month, then 0.5 mg sq weekly    Dispense:  9 mL    Refill:  2      Follow-up: Return in about 3 months (around 02/09/2024) for a follow-up visit.  Sonda Primes, MD

## 2023-11-12 NOTE — Assessment & Plan Note (Signed)
Chronic On Metformin,  Jardiance since 07/2022 Hydrate well joint pains on statins - 70 % better on Rosuvastatin and Zetia Start Ozempic

## 2023-11-12 NOTE — Assessment & Plan Note (Addendum)
GFR 48-53 Multifactorial Hydrate well On Jardiance

## 2023-11-12 NOTE — Assessment & Plan Note (Signed)
Cont w/ current meds including aspirin, Plavix, atorvastatin, metoprolol, benazepril, furosemide Consider PITAVASTATIN

## 2023-12-04 ENCOUNTER — Other Ambulatory Visit: Payer: Self-pay | Admitting: Internal Medicine

## 2023-12-10 ENCOUNTER — Ambulatory Visit (INDEPENDENT_AMBULATORY_CARE_PROVIDER_SITE_OTHER): Payer: Medicare Other

## 2023-12-10 VITALS — Ht 75.0 in | Wt 259.0 lb

## 2023-12-10 DIAGNOSIS — E1165 Type 2 diabetes mellitus with hyperglycemia: Secondary | ICD-10-CM | POA: Diagnosis not present

## 2023-12-10 DIAGNOSIS — Z0000 Encounter for general adult medical examination without abnormal findings: Secondary | ICD-10-CM | POA: Diagnosis not present

## 2023-12-10 NOTE — Patient Instructions (Addendum)
 [name] , Thank you for taking time to come for your Medicare Wellness Visit. I appreciate your ongoing commitment to your health goals. Please review the following plan we discussed and let me know if I can assist you in the future.   Referrals/Orders/Follow-Ups/Clinician Recommendations: It was nice talking with you today.  You are due for a Foot exam and a Shingles vaccine.  Remember to let PCP know when you get your eye examination.  Each day, aim for 6 glasses of water, plenty of protein in your diet and try to get up and walk/ stretch every hour for 5-10 minutes at a time.    This is a list of the screening recommended for you and due dates:  Health Maintenance  Topic Date Due   Complete foot exam   Never done   COVID-19 Vaccine (5 - 2024-25 season) 06/10/2023   Eye exam for diabetics  06/28/2023   Medicare Annual Wellness Visit  10/26/2023   Yearly kidney health urinalysis for diabetes  02/05/2024   Hemoglobin A1C  02/05/2024   Yearly kidney function blood test for diabetes  08/06/2024   Colon Cancer Screening  11/29/2028   DTaP/Tdap/Td vaccine (3 - Td or Tdap) 07/30/2030   Pneumonia Vaccine  Completed   Flu Shot  Completed   Hepatitis C Screening  Completed   HPV Vaccine  Aged Out   Zoster (Shingles) Vaccine  Discontinued    Advanced directives: (Declined) Advance directive discussed with you today. Even though you declined this today, please call our office should you change your mind, and we can give you the proper paperwork for you to fill out.  Next Medicare Annual Wellness Visit scheduled for next year: Yes  Managing Pain Without Opioids Opioids are strong medicines used to treat moderate to severe pain. For some people, especially those who have long-term (chronic) pain, opioids may not be the best choice for pain management due to: Side effects like nausea, constipation, and sleepiness. The risk of addiction (opioid use disorder). The longer you take opioids, the  greater your risk of addiction. Pain that lasts for more than 3 months is called chronic pain. Managing chronic pain usually requires more than one approach and is often provided by a team of health care providers working together (multidisciplinary approach). Pain management may be done at a pain management center or pain clinic. How to manage pain without the use of opioids Use non-opioid medicines Non-opioid medicines for pain may include: Over-the-counter or prescription non-steroidal anti-inflammatory drugs (NSAIDs). These may be the first medicines used for pain. They work well for muscle and bone pain, and they reduce swelling. Acetaminophen. This over-the-counter medicine may work well for milder pain but not swelling. Antidepressants. These may be used to treat chronic pain. A certain type of antidepressant (tricyclics) is often used. These medicines are given in lower doses for pain than when used for depression. Anticonvulsants. These are usually used to treat seizures but may also reduce nerve (neuropathic) pain. Muscle relaxants. These relieve pain caused by sudden muscle tightening (spasms). You may also use a pain medicine that is applied to the skin as a patch, cream, or gel (topical analgesic), such as a numbing medicine. These may cause fewer side effects than medicines taken by mouth. Do certain therapies as directed Some therapies can help with pain management. They include: Physical therapy. You will do exercises to gain strength and flexibility. A physical therapist may teach you exercises to move and stretch parts of your  body that are weak, stiff, or painful. You can learn these exercises at physical therapy visits and practice them at home. Physical therapy may also involve: Massage. Heat wraps or applying heat or cold to affected areas. Electrical signals that interrupt pain signals (transcutaneous electrical nerve stimulation, TENS). Weak lasers that reduce pain and  swelling (low-level laser therapy). Signals from your body that help you learn to regulate pain (biofeedback). Occupational therapy. This helps you to learn ways to function at home and work with less pain. Recreational therapy. This involves trying new activities or hobbies, such as a physical activity or drawing. Mental health therapy, including: Cognitive behavioral therapy (CBT). This helps you learn coping skills for dealing with pain. Acceptance and commitment therapy (ACT) to change the way you think and react to pain. Relaxation therapies, including muscle relaxation exercises and mindfulness-based stress reduction. Pain management counseling. This may be individual, family, or group counseling.  Receive medical treatments Medical treatments for pain management include: Nerve block injections. These may include a pain blocker and anti-inflammatory medicines. You may have injections: Near the spine to relieve chronic back or neck pain. Into joints to relieve back or joint pain. Into nerve areas that supply a painful area to relieve body pain. Into muscles (trigger point injections) to relieve some painful muscle conditions. A medical device placed near your spine to help block pain signals and relieve nerve pain or chronic back pain (spinal cord stimulation device). Acupuncture. Follow these instructions at home Medicines Take over-the-counter and prescription medicines only as told by your health care provider. If you are taking pain medicine, ask your health care providers about possible side effects to watch out for. Do not drive or use heavy machinery while taking prescription opioid pain medicine. Lifestyle  Do not use drugs or alcohol to reduce pain. If you drink alcohol, limit how much you have to: 0-1 drink a day for women who are not pregnant. 0-2 drinks a day for men. Know how much alcohol is in a drink. In the [place] one drink equals one 12 oz bottle of beer (355 mL),  one 5 oz glass of wine (148 mL), or one 1 oz glass of hard liquor (44 mL). Do not use any products that contain nicotine or tobacco. These products include cigarettes, chewing tobacco, and vaping devices, such as e-cigarettes. If you need help quitting, ask your health care provider. Eat a healthy diet and maintain a healthy weight. Poor diet and excess weight may make pain worse. Eat foods that are high in fiber. These include fresh fruits and vegetables, whole grains, and beans. Limit foods that are high in fat and processed sugars, such as fried and sweet foods. Exercise regularly. Exercise lowers stress and may help relieve pain. Ask your health care provider what activities and exercises are safe for you. If your health care provider approves, join an exercise class that combines movement and stress reduction. Examples include yoga and tai chi. Get enough sleep. Lack of sleep may make pain worse. Lower stress as much as possible. Practice stress reduction techniques as told by your therapist. General instructions Work with all your pain management providers to find the treatments that work best for you. You are an important member of your pain management team. There are many things you can do to reduce pain on your own. Consider joining an online or in-person support group for people who have chronic pain. Keep all follow-up visits. This is important. Where to find more information You  can find more information about managing pain without opioids from: American Academy of Pain Medicine: painmed.org Institute for Chronic Pain: instituteforchronicpain.org American Chronic Pain Association: theacpa.org Contact a health care provider if: You have side effects from pain medicine. Your pain gets worse or does not get better with treatments or home therapy. You are struggling with anxiety or depression. Summary Many types of pain can be managed without opioids. Chronic pain may respond better  to pain management without opioids. Pain is best managed when you and a team of health care providers work together. Pain management without opioids may include non-opioid medicines, medical treatments, physical therapy, mental health therapy, and lifestyle changes. Tell your health care providers if your pain gets worse or is not being managed well enough. This information is not intended to replace advice given to you by your health care provider. Make sure you discuss any questions you have with your health care provider. Document Revised: 01/05/2021 Document Reviewed: 01/05/2021 Elsevier Patient Education  2024 [employer].

## 2023-12-10 NOTE — Progress Notes (Signed)
 Subjective:   Brian Mccullough is a 73 y.o. who presents for a Medicare Wellness preventive visit.  Visit Complete: Virtual I connected with  [name] on 12/10/23 by a video and audio enabled telemedicine application and verified that I am speaking with the correct person using two identifiers.  Patient Location: Home  Provider Location: Home Office  I discussed the limitations of evaluation and management by telemedicine. The patient expressed understanding and agreed to proceed.  Vital Signs: Because this visit was a virtual/telehealth visit, some criteria may be missing or patient reported. Any vitals not documented were not able to be obtained and vitals that have been documented are patient reported.   AWV Questionnaire: No: Patient Medicare AWV questionnaire was not completed prior to this visit.  Cardiac Risk Factors include: advanced age (>55men, >8 women);male gender;hypertension;Other (see comment);dyslipidemia;diabetes mellitus, Risk factor comments: CRI,  CAD     Objective:    Today's Vitals   12/10/23 1035  Weight: 259 lb (117.5 kg)  Height: 6\' 3"  (1.905 m)   Body mass index is 32.37 kg/m.     12/10/2023   10:57 AM 10/25/2022    8:48 AM 11/30/2021    6:50 AM 09/27/2021    9:23 AM 08/13/2021    2:00 AM 08/12/2021   10:30 PM 08/04/2020   12:36 AM  Advanced Directives  Does Patient Have a Medical Advance Directive? No No No No No No No  Would patient like information on creating a medical advance directive? No - Patient declined No - Patient declined No - Patient declined No - Patient declined  Yes (ED - Information included in AVS) No - Patient declined    Current Medications (verified) Outpatient Encounter Medications as of 12/10/2023  Medication Sig   acetaminophen (TYLENOL) 500 MG tablet Take 1,000 mg by mouth every 6 (six) hours as needed for headache or moderate pain.   aspirin 81 MG EC tablet Take 81 mg by mouth daily.   atorvastatin (LIPITOR) 40  MG tablet Take 40 mg by mouth daily.   benazepril (LOTENSIN) 20 MG tablet TAKE 1 TABLET BY MOUTH DAILY   cholecalciferol (VITAMIN D3) 25 MCG (1000 UNIT) tablet Take 1,000 Units by mouth daily.   cyanocobalamin 1000 MCG tablet Take 1,000 mcg by mouth daily.   ezetimibe (ZETIA) 10 MG tablet TAKE 1 TABLET BY MOUTH EVERY DAY   furosemide (LASIX) 20 MG tablet TAKE 1 TABLET BY MOUTH AS NEEDED FOR FLUID OR EDEMA. MAX 1 TABLET PER DAY.   JARDIANCE 10 MG TABS tablet TAKE 1 TABLET BY MOUTH EVERY DAY   loratadine (CLARITIN) 10 MG tablet Take 10 mg by mouth daily as needed for allergies or rhinitis.   meloxicam (MOBIC) 15 MG tablet TAKE 1 TABLET BY MOUTH DAILY AS  NEEDED FOR PAIN   metFORMIN (GLUCOPHAGE) 500 MG tablet Take 1 tablet (500 mg total) by mouth daily with breakfast.   metoprolol tartrate (LOPRESSOR) 25 MG tablet TAKE 0.5 TABLETS (12.5 MG TOTAL) BY MOUTH DAILY. CAN TAKE AN ADDITIONAL HALF TABLET DAILY AS NEEDED   rosuvastatin (CRESTOR) 5 MG tablet Take 1 tablet (5 mg total) by mouth daily.   Semaglutide,0.25 or 0.5MG /DOS, (OZEMPIC, 0.25 OR 0.5 MG/DOSE,) 2 MG/3ML SOPN Use 0.25 mg weekly sq for 1 month, then 0.5 mg sq weekly   tiZANidine (ZANAFLEX) 4 MG tablet Take 1 tablet (4 mg total) by mouth every 8 (eight) hours as needed for muscle spasms.   traMADol (ULTRAM) 50 MG tablet TAKE  1-2 TABLETS (50-100 MG TOTAL) BY MOUTH EVERY 6 (SIX) HOURS AS NEEDED FOR MODERATE PAIN OR SEVERE PAIN.   traZODone (DESYREL) 50 MG tablet TAKE 1/2 TO 1 TABLET BY MOUTH AT BEDTIME AS NEEDED FOR SLEEP   triamcinolone ointment (KENALOG) 0.1 % Apply 1 application. topically 2 (two) times daily as needed (rash).   nitroGLYCERIN (NITROSTAT) 0.4 MG SL tablet Place 1 tablet (0.4 mg total) under the tongue every 5 (five) minutes as needed for chest pain.   No facility-administered encounter medications on file as of 12/10/2023.    Allergies (verified) Codeine, Atorvastatin, and Levaquin [levofloxacin]   History: Past Medical  History:  Diagnosis Date   Allergy    CAD (coronary artery disease)    s/p Inf STEMI 10/21>>POBA to RCA >> s/p CABG (L-LAD, S-PDA, L Radial-OM1/RI, RIMA-RPLA) // post op AF; SVT >> Amiod Rx // Echo 10/21: EF 55-60, no RWMA, GLS -18.5, normal RVSF, mild LAE, trivial MR    Carotid artery disease (HCC)    [place] 10/21: R 1-39; L 40-59   History of kidney stones    Hyperactive gag reflex    Hyperlipidemia    Hypertension    under control with meds., has been on med. x 30 yr.   Inguinal hernia 01/2014   bilateral   LBP (low back pain)    Non-insulin dependent type 2 diabetes mellitus (HCC)    OA (osteoarthritis) of knee 01/2004   left   Palpitations    s/p Inf STEMI 07/2020   POBA to RCA >> CABG   STEMI involving right coronary artery (HCC) 08/03/2020   Vitamin B 12 deficiency    Past Surgical History:  Procedure Laterality Date   CARDIAC CATHETERIZATION     CORONARY ARTERY BYPASS GRAFT N/A 08/04/2020   Procedure: CORONARY ARTERY BYPASS GRAFTING (CABG), ON PUMP, TIMES SIX, USING BILATERAL INTERNAL MAMMARY ARTERIES, RIGHT ENDOSCOPICALLY HARVESTED GREATER SAPHENOUS VEIN, AND RIGHT RADIAL ARTERY OPEN HARVEST;  Surgeon: Linden Dolin, MD;  Location: [facility] OR;  Service: Open Heart Surgery;  Laterality: N/A;   CORONARY/GRAFT ACUTE MI REVASCULARIZATION N/A 08/03/2020   Procedure: Coronary/Graft Acute MI Revascularization;  Surgeon: Tonny Bollman, MD;  Location: [facility] INVASIVE CV LAB;  Service: Cardiovascular;  Laterality: N/A;   ENDOVEIN HARVEST OF GREATER SAPHENOUS VEIN Right 08/04/2020   Procedure: ENDOVEIN HARVEST OF GREATER SAPHENOUS VEIN;  Surgeon: Linden Dolin, MD;  Location: [facility] OR;  Service: Open Heart Surgery;  Laterality: Right;   INGUINAL HERNIA REPAIR  age 2-5   INGUINAL HERNIA REPAIR Bilateral 02/09/2014   Procedure: OPEN BILATERAL INGUINAL HERNIA REPAIR WITH MESH;  Surgeon: Ernestene Mention, MD;  Location: [facility];  Service: General;  Laterality: Bilateral;    INSERTION OF MESH Bilateral 02/09/2014   Procedure: INSERTION OF MESH;  Surgeon: Ernestene Mention, MD;  Location: [facility];  Service: General;  Laterality: Bilateral;   KNEE ARTHROSCOPY Right    LEFT HEART CATH AND CORONARY ANGIOGRAPHY N/A 08/03/2020   Procedure: LEFT HEART CATH AND CORONARY ANGIOGRAPHY;  Surgeon: Tonny Bollman, MD;  Location: [facility] INVASIVE CV LAB;  Service: Cardiovascular;  Laterality: N/A;   ORCHIECTOMY Left 05/31/2017   Procedure: LEFT RADICAL ORCHIECTOMY;  Surgeon: Bjorn Pippin, MD;  Location[facility];  Service: Urology;  Laterality: Left;   PROCTOSCOPY N/A 11/30/2021   Procedure: RIGID PROCTOSCOPY;  Surgeon: Karie Soda, MD;  Location: [facility];  Service: General;  Laterality: N/A;   RADIAL ARTERY HARVEST Right 08/04/2020  Procedure: RADIAL ARTERY HARVEST;  Surgeon: Linden Dolin, MD;  Location: [facility] OR;  Service: Open Heart Surgery;  Laterality: Right;   TEE WITHOUT CARDIOVERSION N/A 08/04/2020   Procedure: TRANSESOPHAGEAL ECHOCARDIOGRAM (TEE);  Surgeon: Linden Dolin, MD;  Location: [facility] OR;  Service: Open Heart Surgery;  Laterality: N/A;   TONSILLECTOMY AND ADENOIDECTOMY  1960   TOTAL KNEE ARTHROPLASTY Left 01/14/2004   Family History  Problem Relation Age of Onset   Hypertension Mother    Diabetes Mother    Diabetes Father    Colon cancer Neg Hx    Esophageal cancer Neg Hx    Rectal cancer Neg Hx    Stomach cancer Neg Hx    Colon polyps Neg Hx    Social History   Socioeconomic History   Marital status: Married    Spouse name: Jasmine December   Number of children: 3   Years of education: 12   Highest education level: Not on file  Occupational History   Occupation: diabled    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  Tobacco Use   Smoking status: Former    Types: Cigars    Quit date: 08/03/2020    Years since quitting: 3.3   Smokeless tobacco: Never   Tobacco comments:    2-3 small cigars/day  Vaping Use   Vaping  status: Never Used  Substance and Sexual Activity   Alcohol use: No   Drug use: No   Sexual activity: Yes  Other Topics Concern   Not on file  Social History Narrative   Lives with wife.-2025   Social Drivers of Health   Financial Resource Strain: Low Risk  (10/25/2022)   Overall Financial Resource Strain (CARDIA)    Difficulty of Paying Living Expenses: Not hard at all  Food Insecurity: No Food Insecurity (10/25/2022)   Hunger Vital Sign    Worried About Running Out of Food in the Last Year: Never true    Ran Out of Food in the Last Year: Never true  Transportation Needs: No Transportation Needs (10/25/2022)   PRAPARE - Administrator, Civil Service (Medical): No    Lack of Transportation (Non-Medical): No  Physical Activity: Sufficiently Active (10/25/2022)   Exercise Vital Sign    Days of Exercise per Week: 5 days    Minutes of Exercise per Session: 30 min  Stress: No Stress Concern Present (10/25/2022)   [employer] of Occupational Health - Occupational Stress Questionnaire    Feeling of Stress : Not at all  Social Connections: Moderately Isolated (10/25/2022)   Social Connection and Isolation Panel [NHANES]    Frequency of Communication with Friends and Family: More than three times a week    Frequency of Social Gatherings with Friends and Family: More than three times a week    Attends Religious Services: Never    Database administrator or Organizations: No    Attends Engineer, structural: Never    Marital Status: Married    Tobacco Counseling Counseling given: Not Answered Tobacco comments: 2-3 small cigars/day    Clinical Intake:  Pre-visit preparation completed: Yes  Pain : No/denies pain     BMI - recorded: 32.37 Nutritional Status: BMI > 30  Obese Nutritional Risks: None Diabetes: Yes CBG done?: No Did pt. bring in CBG monitor from home?: No  How often do you need to have someone help you when you read instructions,  pamphlets, or other written materials from your doctor or  pharmacy?: 1 - Never     Information entered by :: Idy Rawling, RMA   Activities of Daily Living     12/10/2023   10:38 AM  In your present state of health, do you have any difficulty performing the following activities:  Hearing? 0  Vision? 0  Difficulty concentrating or making decisions? 0  Walking or climbing stairs? 0  Dressing or bathing? 0  Doing errands, shopping? 0  Preparing Food and eating ? N  Using the Toilet? N  In the past six months, have you accidently leaked urine? N  Do you have problems with loss of bowel control? N  Managing your Medications? N  Managing your Finances? N  Housekeeping or managing your Housekeeping? N    Patient Care Team: [name], Georgina Quint, MD as PCP - General (Internal Medicine) Tonny Bollman, MD as PCP - Cardiology (Cardiology) Myrtie Neither, MD (Orthopedic Surgery) Linden Dolin, MD (Inactive) as Consulting Physician (Cardiothoracic Surgery) Kennon Rounds as Physician Assistant (Cardiology) Bjorn Pippin, MD as Attending Physician (Urology) Meryl Dare, MD (Inactive) as Consulting Physician (Gastroenterology) Ronne Binning Mardene Celeste, MD as Consulting Physician (Urology) Karie Soda, MD as Consulting Physician (Colon and Rectal Surgery) Gelene Mink, OD as Referring Physician (Optometry)  Indicate any recent Medical Services you may have received from other than [facility] providers in the past year (date may be approximate).     Assessment:   This is a routine wellness examination for Adalid.  Hearing/Vision screen Hearing Screening - Comments:: Denies hearing difficulties   Vision Screening - Comments:: Wears eyeglasses   Goals Addressed   None    Depression Screen     05/07/2023    8:58 AM 02/05/2023    9:05 AM 11/06/2022    9:22 AM 10/25/2022    8:51 AM 09/27/2021    9:28 AM 12/10/2020   11:31 AM 10/12/2020   12:12 PM  PHQ 2/9 Scores  PHQ - 2  Score 0 0 0 0 0 0 0    Fall Risk     12/10/2023   10:57 AM 05/07/2023    8:58 AM 02/05/2023    9:05 AM 11/06/2022    9:22 AM 10/25/2022    8:49 AM  Fall Risk   Falls in the past year? 0 0 0 0 0  Number falls in past yr: 0 0 0 0 0  Injury with Fall? 0 0 0 0 0  Risk for fall due to : No Fall Risks No Fall Risks No Fall Risks No Fall Risks No Fall Risks  Follow up Falls prevention discussed;Falls evaluation completed Falls evaluation completed Falls evaluation completed Falls evaluation completed Falls prevention discussed    MEDICARE RISK AT HOME:  Medicare Risk at Home Any stairs in or around the home?: Yes If so, are there any without handrails?: Yes Home free of loose throw rugs in walkways, pet beds, electrical cords, etc?: Yes Adequate lighting in your home to reduce risk of falls?: Yes Life alert?: No Use of a cane, walker or w/c?: No Grab bars in the bathroom?: Yes Shower chair or bench in shower?: No Elevated toilet seat or a handicapped toilet?: No  TIMED UP AND GO:  Was the test performed?  No  Cognitive Function: 6CIT completed        10/25/2022    8:51 AM  6CIT Screen  What Year? 0 points  What month? 0 points  What time? 0 points  Count back from 20 0 points  Months in reverse 0 points  Repeat phrase 0 points  Total Score 0 points    Immunizations Immunization History  Administered Date(s) Administered   Fluad Quad(high Dose 65+) 07/30/2020, 08/18/2021, 08/07/2022   Fluad Trivalent(High Dose 65+) 08/07/2023   Influenza Split 09/28/2011, 06/19/2012   Influenza Whole 06/29/2008, 07/05/2009, 09/20/2010   Influenza, High Dose Seasonal PF 08/14/2017, 06/14/2018, 07/12/2019   Influenza,inj,Quad PF,6+ Mos 06/19/2013, 05/29/2014, 08/30/2015, 08/01/2016   PFIZER(Purple Top)SARS-COV-2 Vaccination 10/28/2019, 11/18/2019, 07/21/2020   Pfizer Covid-19 Vaccine Bivalent Booster 37yrs & up 08/16/2021   Pneumococcal Conjugate-13 05/29/2014   Pneumococcal  Polysaccharide-23 11/02/2009, 11/15/2017   Td 11/02/2009   Tdap 07/30/2020   Zoster, Live 12/30/2013    Screening Tests Health Maintenance  Topic Date Due   FOOT EXAM  Never done   COVID-19 Vaccine (5 - 2024-25 season) 06/10/2023   OPHTHALMOLOGY EXAM  06/28/2023   Medicare Annual Wellness (AWV)  10/26/2023   Diabetic kidney evaluation - Urine ACR  02/05/2024   HEMOGLOBIN A1C  02/05/2024   Diabetic kidney evaluation - eGFR measurement  08/06/2024   Colonoscopy  11/29/2028   DTaP/Tdap/Td (3 - Td or Tdap) 07/30/2030   Pneumonia Vaccine 72+ Years old  Completed   INFLUENZA VACCINE  Completed   Hepatitis C Screening  Completed   HPV VACCINES  Aged Out   Zoster Vaccines- Shingrix  Discontinued    Health Maintenance  Health Maintenance Due  Topic Date Due   FOOT EXAM  Never done   COVID-19 Vaccine (5 - 2024-25 season) 06/10/2023   OPHTHALMOLOGY EXAM  06/28/2023   Medicare Annual Wellness (AWV)  10/26/2023   Health Maintenance Items Addressed: See Nurse Notes  Additional Screening:  Vision Screening: Recommended annual ophthalmology exams for early detection of glaucoma and other disorders of the eye.  Dental Screening: Recommended annual dental exams for proper oral hygiene  Community Resource Referral / Chronic Care Management: CRR required this visit?  No   CCM required this visit?  No     Plan:     I have personally reviewed and noted the following in the patient's chart:   Medical and social history Use of alcohol, tobacco or illicit drugs  Current medications and supplements including opioid prescriptions. Patient is currently taking opioid prescriptions. Information provided to patient regarding non-opioid alternatives. Patient advised to discuss non-opioid treatment plan with their provider. Functional ability and status Nutritional status Physical activity Advanced directives List of other physicians Hospitalizations, surgeries, and ER visits in  previous 12 months Vitals Screenings to include cognitive, depression, and falls Referrals and appointments  In addition, I have reviewed and discussed with patient certain preventive protocols, quality metrics, and best practice recommendations. A written personalized care plan for preventive services as well as general preventive health recommendations were provided to patient.     Aristide Waggle L Diksha Tagliaferro, CMA   12/10/2023   After Visit Summary: (MyChart) Due to this being a telephonic visit, the after visit summary with patients personalized plan was offered to patient via MyChart   Notes: Please refer to Routing Comments.

## 2023-12-14 ENCOUNTER — Other Ambulatory Visit: Payer: Self-pay | Admitting: Internal Medicine

## 2023-12-18 ENCOUNTER — Other Ambulatory Visit: Payer: Self-pay | Admitting: Internal Medicine

## 2023-12-21 ENCOUNTER — Other Ambulatory Visit: Payer: Self-pay | Admitting: Internal Medicine

## 2023-12-27 ENCOUNTER — Other Ambulatory Visit: Payer: Self-pay | Admitting: Cardiovascular Disease

## 2024-02-11 ENCOUNTER — Ambulatory Visit (INDEPENDENT_AMBULATORY_CARE_PROVIDER_SITE_OTHER): Payer: Medicare Other | Admitting: Internal Medicine

## 2024-02-11 ENCOUNTER — Encounter: Payer: Self-pay | Admitting: Internal Medicine

## 2024-02-11 VITALS — BP 122/78 | HR 53 | Temp 98.6°F | Ht 75.0 in | Wt 246.8 lb

## 2024-02-11 DIAGNOSIS — E538 Deficiency of other specified B group vitamins: Secondary | ICD-10-CM

## 2024-02-11 DIAGNOSIS — E785 Hyperlipidemia, unspecified: Secondary | ICD-10-CM | POA: Diagnosis not present

## 2024-02-11 DIAGNOSIS — E1165 Type 2 diabetes mellitus with hyperglycemia: Secondary | ICD-10-CM | POA: Diagnosis not present

## 2024-02-11 DIAGNOSIS — I25110 Atherosclerotic heart disease of native coronary artery with unstable angina pectoris: Secondary | ICD-10-CM | POA: Diagnosis not present

## 2024-02-11 DIAGNOSIS — I10 Essential (primary) hypertension: Secondary | ICD-10-CM

## 2024-02-11 DIAGNOSIS — Z7985 Long-term (current) use of injectable non-insulin antidiabetic drugs: Secondary | ICD-10-CM

## 2024-02-11 DIAGNOSIS — F411 Generalized anxiety disorder: Secondary | ICD-10-CM

## 2024-02-11 LAB — COMPREHENSIVE METABOLIC PANEL WITH GFR
ALT: 7 U/L (ref 0–53)
AST: 13 U/L (ref 0–37)
Albumin: 4.1 g/dL (ref 3.5–5.2)
Alkaline Phosphatase: 49 U/L (ref 39–117)
BUN: 13 mg/dL (ref 6–23)
CO2: 25 meq/L (ref 19–32)
Calcium: 9.2 mg/dL (ref 8.4–10.5)
Chloride: 107 meq/L (ref 96–112)
Creatinine, Ser: 1.4 mg/dL (ref 0.40–1.50)
GFR: 50.4 mL/min — ABNORMAL LOW (ref >60.00)
Glucose, Bld: 81 mg/dL (ref 70–99)
Potassium: 4.2 meq/L (ref 3.5–5.1)
Sodium: 139 meq/L (ref 135–145)
Total Bilirubin: 0.6 mg/dL (ref 0.2–1.2)
Total Protein: 6.8 g/dL (ref 6.0–8.3)

## 2024-02-11 LAB — HEMOGLOBIN A1C: Hgb A1c MFr Bld: 5.8 % (ref 4.6–6.5)

## 2024-02-11 MED ORDER — OZEMPIC (0.25 OR 0.5 MG/DOSE) 2 MG/3ML ~~LOC~~ SOPN: 0.5000 mg | PEN_INJECTOR | SUBCUTANEOUS | 2 refills | Status: AC

## 2024-02-11 NOTE — Assessment & Plan Note (Signed)
 On Rosuvastatin  and Zetia 

## 2024-02-11 NOTE — Assessment & Plan Note (Signed)
On Jardiance and Benazepril Hydrate well

## 2024-02-11 NOTE — Assessment & Plan Note (Signed)
 Cont w/ current meds including aspirin , Plavix , crestor , metoprolol , benazepril , furosemide  Consider PITAVASTATIN

## 2024-02-11 NOTE — Progress Notes (Signed)
 Subjective:  Patient ID: Brian Mccullough, male    DOB: [birth year]  Age: 73 y.o. MRN: [id number]  CC: Medical Management of Chronic Issues (3 Month Follow Up. No concerns)   HPI [name] presents for DM, HTN, CAD, anxiety  Outpatient Medications Prior to Visit  Medication Sig Dispense Refill   acetaminophen  (TYLENOL ) 500 MG tablet Take 1,000 mg by mouth every 6 (six) hours as needed for headache or moderate pain.     aspirin  81 MG EC tablet Take 81 mg by mouth daily.     benazepril  (LOTENSIN ) 20 MG tablet TAKE 1 TABLET BY MOUTH DAILY 100 tablet 2   cholecalciferol  (VITAMIN D3) 25 MCG (1000 UNIT) tablet Take 1,000 Units by mouth daily.     cyanocobalamin  1000 MCG tablet Take 1,000 mcg by mouth daily.     ezetimibe  (ZETIA ) 10 MG tablet TAKE 1 TABLET BY MOUTH EVERY DAY 90 tablet 3   furosemide  (LASIX ) 20 MG tablet TAKE 1 TABLET BY MOUTH AS NEEDED FOR FLUID OR EDEMA. MAX 1 TABLET PER DAY 100 tablet 2   JARDIANCE  10 MG TABS tablet TAKE 1 TABLET BY MOUTH EVERY DAY 30 tablet 11   loratadine  (CLARITIN ) 10 MG tablet Take 10 mg by mouth daily as needed for allergies or rhinitis.     meloxicam  (MOBIC ) 15 MG tablet TAKE 1 TABLET BY MOUTH DAILY AS  NEEDED FOR PAIN 100 tablet 1   metFORMIN  (GLUCOPHAGE ) 500 MG tablet TAKE 1 TABLET BY MOUTH DAILY  WITH BREAKFAST 100 tablet 2   metoprolol  tartrate (LOPRESSOR ) 25 MG tablet TAKE 0.5 TABLETS (12.5 MG TOTAL) BY MOUTH DAILY. CAN TAKE AN ADDITIONAL HALF TABLET DAILY AS NEEDED 90 tablet 1   rosuvastatin  (CRESTOR ) 5 MG tablet TAKE 1 TABLET (5 MG TOTAL) BY MOUTH DAILY. 90 tablet 2   tiZANidine  (ZANAFLEX ) 4 MG tablet Take 1 tablet (4 mg total) by mouth every 8 (eight) hours as needed for muscle spasms. 90 tablet 0   traMADol  (ULTRAM ) 50 MG tablet TAKE 1-2 TABLETS (50-100 MG TOTAL) BY MOUTH EVERY 6 (SIX) HOURS AS NEEDED FOR MODERATE PAIN OR SEVERE PAIN. 120 tablet 1   traZODone  (DESYREL ) 50 MG tablet TAKE 1/2 TO 1 TABLET BY MOUTH AT BEDTIME AS NEEDED FOR  SLEEP 90 tablet 3   triamcinolone  ointment (KENALOG ) 0.1 % Apply 1 application. topically 2 (two) times daily as needed (rash). 90 g 3   atorvastatin  (LIPITOR ) 40 MG tablet TAKE 1 TABLET BY MOUTH DAILY 100 tablet 2   Semaglutide ,0.25 or 0.5MG /DOS, (OZEMPIC , 0.25 OR 0.5 MG/DOSE,) 2 MG/3ML SOPN Use 0.25 mg weekly sq for 1 month, then 0.5 mg sq weekly 9 mL 2   nitroGLYCERIN  (NITROSTAT ) 0.4 MG SL tablet Place 1 tablet (0.4 mg total) under the tongue every 5 (five) minutes as needed for chest pain. 30 tablet 3   No facility-administered medications prior to visit.    ROS: Review of Systems  Constitutional:  Negative for appetite change, fatigue and unexpected weight change.  HENT:  Negative for congestion, nosebleeds, sneezing, sore throat and trouble swallowing.   Eyes:  Negative for itching and visual disturbance.  Respiratory:  Negative for cough.   Cardiovascular:  Negative for chest pain, palpitations and leg swelling.  Gastrointestinal:  Negative for abdominal distention, blood in stool, diarrhea and nausea.  Genitourinary:  Negative for frequency and hematuria.  Musculoskeletal:  Negative for back pain, gait problem, joint swelling and neck pain.  Skin:  Negative for rash.  Neurological:  Negative for dizziness, tremors, speech difficulty and weakness.  Psychiatric/Behavioral:  Negative for agitation, dysphoric mood and sleep disturbance. The patient is not nervous/anxious.     Objective:  BP 122/78   Pulse (!) 53   Temp 98.6 F (37 C)   Ht 6\' 3"  (1.905 m)   Wt 246 lb 12.8 oz (111.9 kg)   SpO2 99%   BMI 30.85 kg/m   BP Readings from Last 3 Encounters:  02/11/24 122/78  11/12/23 118/70  08/13/23 108/66    Wt Readings from Last 3 Encounters:  02/11/24 246 lb 12.8 oz (111.9 kg)  12/10/23 259 lb (117.5 kg)  11/12/23 259 lb (117.5 kg)    Physical Exam Constitutional:      General: He is not in acute distress.    Appearance: He is well-developed.     Comments: NAD   Eyes:     Conjunctiva/sclera: Conjunctivae normal.     Pupils: Pupils are equal, round, and reactive to light.  Neck:     Thyroid : No thyromegaly.     Vascular: No JVD.  Cardiovascular:     Rate and Rhythm: Normal rate and regular rhythm.     Heart sounds: Normal heart sounds. No murmur heard.    No friction rub. No gallop.  Pulmonary:     Effort: Pulmonary effort is normal. No respiratory distress.     Breath sounds: Normal breath sounds. No wheezing or rales.  Chest:     Chest wall: No tenderness.  Abdominal:     General: Bowel sounds are normal. There is no distension.     Palpations: Abdomen is soft. There is no mass.     Tenderness: There is no abdominal tenderness. There is no guarding or rebound.  Musculoskeletal:        General: No tenderness. Normal range of motion.     Cervical back: Normal range of motion.  Lymphadenopathy:     Cervical: No cervical adenopathy.  Skin:    General: Skin is warm and dry.     Findings: No rash.  Neurological:     Mental Status: He is alert and oriented to person, place, and time.     Cranial Nerves: No cranial nerve deficit.     Motor: No abnormal muscle tone.     Coordination: Coordination normal.     Gait: Gait normal.     Deep Tendon Reflexes: Reflexes are normal and symmetric.  Psychiatric:        Behavior: Behavior normal.        Thought Content: Thought content normal.        Judgment: Judgment normal.     Lab Results  Component Value Date   WBC 12.2 (H) 12/01/2021   HGB 12.2 (L) 12/01/2021   HCT 36.6 (L) 12/01/2021   PLT 173 12/01/2021   GLUCOSE 87 08/07/2023   CHOL 117 04/06/2023   TRIG 110 04/06/2023   HDL 41 04/06/2023   LDLDIRECT 88.1 01/23/2011   LDLCALC 56 04/06/2023   ALT 7 08/07/2023   AST 13 08/07/2023   NA 139 08/07/2023   K 4.6 08/07/2023   CL 106 08/07/2023   CREATININE 1.45 08/07/2023   BUN 16 08/07/2023   CO2 26 08/07/2023   TSH 0.60 02/05/2023   PSA 0.27 02/05/2023   INR 1.5 (H) 08/04/2020    HGBA1C 6.0 08/07/2023   MICROALBUR 3.3 (H) 02/05/2023    No results found.  Assessment & Plan:   Problem List Items Addressed This Visit  Dyslipidemia (Chronic)   On Rosuvastatin  and Zetia       Diabetes mellitus type 2, controlled (HCC) - Primary   On Ozempic  0.5 mg/wk      Relevant Medications   Semaglutide ,0.25 or 0.5MG /DOS, (OZEMPIC , 0.25 OR 0.5 MG/DOSE,) 2 MG/3ML SOPN   B12 deficiency   Cont on Vit B12      Anxiety state   On Pamelor       HTN (hypertension)   On Jardiance  and Benazepril  Hydrate well      Coronary artery disease   Cont w/ current meds including aspirin , Plavix , crestor , metoprolol , benazepril , furosemide  Consider PITAVASTATIN         Meds ordered this encounter  Medications   Semaglutide ,0.25 or 0.5MG /DOS, (OZEMPIC , 0.25 OR 0.5 MG/DOSE,) 2 MG/3ML SOPN    Sig: Inject 0.5 mg into the skin once a week.    Dispense:  9 mL    Refill:  2      Follow-up: Return in about 3 months (around 05/13/2024) for a follow-up visit.  Anitra Barn, MD

## 2024-02-11 NOTE — Assessment & Plan Note (Signed)
On Pamelor 

## 2024-02-11 NOTE — Assessment & Plan Note (Signed)
 On Ozempic  0.5 mg/wk

## 2024-02-11 NOTE — Assessment & Plan Note (Signed)
Cont on Vit B12 

## 2024-02-12 ENCOUNTER — Encounter: Payer: Self-pay | Admitting: Internal Medicine

## 2024-02-23 LAB — HM DIABETES EYE EXAM

## 2024-02-25 NOTE — Progress Notes (Signed)
 Cardiology Office Note    Patient Name: Brian Mccullough Date of Encounter: 02/25/2024  Primary Care Provider:  Genia Kettering, MD Primary Cardiologist:  Arnoldo Lapping, MD Primary Electrophysiologist: None   Past Medical History    Past Medical History:  Diagnosis Date   Allergy    CAD (coronary artery disease)    s/p Inf STEMI 10/21>>POBA to RCA >> s/p CABG (L-LAD, S-PDA, L Radial-OM1/RI, RIMA-RPLA) // post op AF; SVT >> Amiod Rx // Echo 10/21: EF 55-60, no RWMA, GLS -18.5, normal RVSF, mild LAE, trivial MR    Carotid artery disease (HCC)    US  10/21: R 1-39; L 40-59   History of kidney stones    Hyperactive gag reflex    Hyperlipidemia    Hypertension    under control with meds., has been on med. x 30 yr.   Inguinal hernia 01/2014   bilateral   LBP (low back pain)    Non-insulin  dependent type 2 diabetes mellitus (HCC)    OA (osteoarthritis) of knee 01/2004   left   Palpitations    s/p Inf STEMI 07/2020   POBA to RCA >> CABG   STEMI involving right coronary artery (HCC) 08/03/2020   Vitamin B 12 deficiency     History of Present Illness  Brian Mccullough is a 73 y.o. male with PMH of CAD s/p inferior STEMI 2021 with angioplasty to RCA and CABG x6, carotid artery disease (right 1-39%, left 40-59%), HTN, HLD, DM type II, SVT who presents today for annual follow-up.  Brian Mccullough was last seen by Dr. Arlester Ladd on 02/12/2023 doing well with some right arm pain related to statin drug use.  He was last seen on 08/13/2023 for follow-up visit was found to have stable blood pressure of 108/66.  We discussed GLP-1 for weight loss and patient followed up with PCP regarding initiation.  Brian Mccullough presents today for annual follow-up.  He reports experiencing an episode of palpitations that occurred over a 2-hour period.On April 29th, he experienced palpitations that differed from his usual episodes, describing them as a 'weird weakness' with an irregular heart rhythm. The episode  lasted a couple of hours, longer than typical. No shortness of breath, fatigue, chest tightness, or heaviness was noted during the episode. A similar but brief episode occurred about a week later.He has a history of supraventricular tachycardia, with recent episodes featuring more pronounced extra beats. He has not previously worn a heart monitor. His blood pressure is stable at 102/62, with no dizziness unless standing up quickly. He is currently taking Ozempic  and has experienced mild queasiness but no significant gastrointestinal symptoms like vomiting or diarrhea. He frequently drinks orange soda but does not consume alcohol. He has a history of arm pain, suspected to be related to arthritis rather than statin use. He recently switched from atorvastatin  to Rasuvo and is also taking Azetia. Occasional leg cramps are reported. He is an early riser, waking between 4:30 and 5:00 AM. Patient denies chest pain, palpitations, dyspnea, PND, orthopnea, nausea, vomiting, dizziness, syncope, edema, weight gain, or early satiety.  Discussed the use of AI scribe software for clinical note transcription with the patient, who gave verbal consent to proceed.  History of Present Illness   Review of System  Please see the history of present illness.    All other systems reviewed and are otherwise negative except as noted above.  Physical Exam     Wt Readings from Last 3 Encounters:  02/11/24 246 lb  12.8 oz (111.9 kg)  12/10/23 259 lb (117.5 kg)  11/12/23 259 lb (117.5 kg)   UE:AVWUJ were no vitals filed for this visit.,There is no height or weight on file to calculate BMI. GEN: Well nourished, well developed in no acute distress Neck: No JVD; No carotid bruits Pulmonary: Clear to auscultation without rales, wheezing or rhonchi  Cardiovascular: Normal rate. Regular rhythm. Normal S1. Normal S2.   Murmurs: There is no murmur.  ABDOMEN: Soft, non-tender, non-distended EXTREMITIES:  No edema; No deformity    EKG/LABS/ Recent Cardiac Studies   ECG personally reviewed by me today -sinus rhythm with left anterior fascicular block with left atrial enlargement and rate of 60 bpm with no PACs present.  Risk Assessment/Calculations:          Lab Results  Component Value Date   WBC 12.2 (H) 12/01/2021   HGB 12.2 (L) 12/01/2021   HCT 36.6 (L) 12/01/2021   MCV 87.1 12/01/2021   PLT 173 12/01/2021   Lab Results  Component Value Date   CREATININE 1.40 02/11/2024   BUN 13 02/11/2024   NA 139 02/11/2024   K 4.2 02/11/2024   CL 107 02/11/2024   CO2 25 02/11/2024   Lab Results  Component Value Date   CHOL 117 04/06/2023   HDL 41 04/06/2023   LDLCALC 56 04/06/2023   LDLDIRECT 88.1 01/23/2011   TRIG 110 04/06/2023   CHOLHDL 2.9 04/06/2023    Lab Results  Component Value Date   HGBA1C 5.8 02/11/2024   Assessment & Plan    Assessment & Plan  1.  Coronary artery disease: -s/p CABG x6 in 2021 and today reports that he is currently not experiencing any chest pain but does note occasional palpitations -Continue current GDMT with ASA 81 mg, Crestor  5 mg Jardiance  10 mg, metoprolol  12.5 mg t daily   2.  Essential hypertension: -Patient's blood pressure today was 102/62 -Benazepril  20 mg and metoprolol  to tartrate 12.5 mg daily   3.  Carotid artery disease: -Carotid Dopplers completed in 07/2020 showing right 1-39%, left 40-59% -Currently asymptomatic and will continue ASA 81 mg and Crestor  5 mg   4.  DM type II: -Patient's last hemoglobin A1c was 5.8 percent -Continue current treatment plan per PCP   5.  Hyperlipidemia: -Patient's last LDL was 56 at goal  -Continue Crestor  5 mg and ezetimibe  10 mg   6.  History of SVT: Intermittent palpitations with irregular heartbeat suggest possible SVT exacerbation. No electrolyte disturbances noted despite Ozempic  use. - Order Zio patch for 14-day heart rhythm monitoring. Advise him to take an additional 25 mg tablet of metoprolol  if  prolonged palpitations occur, monitoring blood pressure closely. - Consider referral to electrophysiologist if significant arrhythmias are detected.  Disposition: Follow-up with Arnoldo Lapping, MD or APP in 12 months    Signed, Francene Ing, Retha Cast, NP 02/25/2024, 12:48 PM [facility]

## 2024-02-26 ENCOUNTER — Other Ambulatory Visit: Payer: Self-pay | Admitting: Nurse Practitioner

## 2024-02-26 ENCOUNTER — Ambulatory Visit: Attending: Nurse Practitioner | Admitting: Nurse Practitioner

## 2024-02-26 ENCOUNTER — Ambulatory Visit

## 2024-02-26 ENCOUNTER — Encounter: Payer: Self-pay | Admitting: Nurse Practitioner

## 2024-02-26 VITALS — BP 102/62 | HR 62 | Ht 75.0 in | Wt 246.0 lb

## 2024-02-26 DIAGNOSIS — I6523 Occlusion and stenosis of bilateral carotid arteries: Secondary | ICD-10-CM | POA: Diagnosis not present

## 2024-02-26 DIAGNOSIS — Z6831 Body mass index (BMI) 31.0-31.9, adult: Secondary | ICD-10-CM

## 2024-02-26 DIAGNOSIS — I10 Essential (primary) hypertension: Secondary | ICD-10-CM

## 2024-02-26 DIAGNOSIS — I471 Supraventricular tachycardia, unspecified: Secondary | ICD-10-CM

## 2024-02-26 DIAGNOSIS — I25110 Atherosclerotic heart disease of native coronary artery with unstable angina pectoris: Secondary | ICD-10-CM

## 2024-02-26 DIAGNOSIS — E782 Mixed hyperlipidemia: Secondary | ICD-10-CM

## 2024-02-26 DIAGNOSIS — E6609 Other obesity due to excess calories: Secondary | ICD-10-CM

## 2024-02-26 DIAGNOSIS — E66811 Obesity, class 1: Secondary | ICD-10-CM

## 2024-02-26 DIAGNOSIS — E1165 Type 2 diabetes mellitus with hyperglycemia: Secondary | ICD-10-CM | POA: Diagnosis not present

## 2024-02-26 DIAGNOSIS — R002 Palpitations: Secondary | ICD-10-CM

## 2024-02-26 NOTE — Progress Notes (Unsigned)
Enrolled for [employer] to mail a ZIO XT long term holter monitor to the patients address on file.   Dr. Turner to read. 

## 2024-02-26 NOTE — Patient Instructions (Signed)
 Medication Instructions:  Your physician recommends that you continue on your current medications as directed. Please refer to the Current Medication list given to you today. *If you need a refill on your cardiac medications before your next appointment, please call your pharmacy*  Lab Work: None ordered If you have labs (blood work) drawn today and your tests are completely normal, you will receive your results only by: MyChart Message (if you have MyChart) OR A paper copy in the mail If you have any lab test that is abnormal or we need to change your treatment, we will call you to review the results.  Testing/Procedures: Brian Mccullough- Long Term Monitor Instructions  Your physician has requested you wear a ZIO patch monitor for 14 days.  This is a single patch monitor. [employer] supplies one patch monitor per enrollment. Additional stickers are not available. Please do not apply patch if you will be having a Nuclear Stress Test,  Echocardiogram, Cardiac CT, MRI, or Chest Xray during the period you would be wearing the  monitor. The patch cannot be worn during these tests. You cannot remove and re-apply the  ZIO XT patch monitor.  Your ZIO patch monitor will be mailed 3 day USPS to your address on file. It may take 3-5 days  to receive your monitor after you have been enrolled.  Once you have received your monitor, please review the enclosed instructions. Your monitor  has already been registered assigning a specific monitor serial # to you.  Billing and Patient Assistance Program Information  We have supplied [employer] with any of your insurance information on file for billing purposes. [employer] offers a sliding scale Patient Assistance Program for patients that do not have  insurance, or whose insurance does not completely cover the cost of the ZIO monitor.  You must apply for the Patient Assistance Program to qualify for this discounted rate.  To apply, please call [employer] at [phone],  select option 4, select option 2, ask to apply for  Patient Assistance Program. Brian Mccullough will ask your household income, and how many people  are in your household. They will quote your out-of-pocket cost based on that information.  [employer] will also be able to set up a 48-month, interest-free payment plan if needed.  Applying the monitor   Shave hair from upper left chest.  Hold abrader disc by orange tab. Rub abrader in 40 strokes over the upper left chest as  indicated in your monitor instructions.  Clean area with 4 enclosed alcohol pads. Let dry.  Apply patch as indicated in monitor instructions. Patch will be placed under collarbone on left  side of chest with arrow pointing upward.  Rub patch adhesive wings for 2 minutes. Remove white label marked "1". Remove the white  label marked "2". Rub patch adhesive wings for 2 additional minutes.  While looking in a mirror, press and release button in center of patch. A small green light will  flash 3-4 times. This will be your only indicator that the monitor has been turned on.  Do not shower for the first 24 hours. You may shower after the first 24 hours.  Press the button if you feel a symptom. You will hear a small click. Record Date, Time and  Symptom in the Patient Logbook.  When you are ready to remove the patch, follow instructions on the last 2 pages of Patient  Logbook. Stick patch monitor onto the last page of Patient Logbook.  Place Patient Logbook in the blue  and white box. Use locking tab on box and tape box closed  securely. The blue and white box has prepaid postage on it. Please place it in the mailbox as  soon as possible. Your physician should have your test results approximately 7 days after the  monitor has been mailed back to [employer].  Call [facility] Customer Care at [phone] if you have questions regarding  your ZIO XT patch monitor. Call them immediately if you see an orange light blinking on your   monitor.  If your monitor falls off in less than 4 days, contact our Monitor department at [phone].  If your monitor becomes loose or falls off after 4 days call [employer] at [phone] for  suggestions on securing your monitor   Follow-Up: At [facility], you and your health needs are our priority.  As part of our continuing mission to provide you with exceptional heart care, our providers are all part of one team.  This team includes your primary Cardiologist (physician) and Advanced Practice Providers or APPs (Physician Assistants and Nurse Practitioners) who all work together to provide you with the care you need, when you need it.  Your next appointment:   6 month(s)  Provider:   Arnoldo Lapping, MD    We recommend signing up for the patient portal called "MyChart".  Sign up information is provided on this After Visit Summary.  MyChart is used to connect with patients for Virtual Visits (Telemedicine).  Patients are able to view lab/test results, encounter notes, upcoming appointments, etc.  Non-urgent messages can be sent to your provider as well.   To learn more about what you can do with MyChart, go to [email or web address].   Other Instructions

## 2024-03-08 ENCOUNTER — Other Ambulatory Visit: Payer: Self-pay | Admitting: Internal Medicine

## 2024-03-25 DIAGNOSIS — R002 Palpitations: Secondary | ICD-10-CM | POA: Diagnosis not present

## 2024-03-25 DIAGNOSIS — I6523 Occlusion and stenosis of bilateral carotid arteries: Secondary | ICD-10-CM | POA: Diagnosis not present

## 2024-03-26 ENCOUNTER — Other Ambulatory Visit: Payer: Self-pay | Admitting: Internal Medicine

## 2024-03-26 ENCOUNTER — Ambulatory Visit: Payer: Self-pay | Admitting: Nurse Practitioner

## 2024-03-26 DIAGNOSIS — R002 Palpitations: Secondary | ICD-10-CM

## 2024-03-26 DIAGNOSIS — I471 Supraventricular tachycardia, unspecified: Secondary | ICD-10-CM

## 2024-04-08 NOTE — Progress Notes (Signed)
 " Electrophysiology Office Note:    Date:  04/09/2024   ID:  Brian Mccullough, DOB 1951/01/12, MRN [id number]  PCP:  Garald Karlynn GAILS, MD   [facility] Providers Cardiologist:  Ozell Fell, MD Cardiology APP:  Lelon Glendia ONEIDA DEVONNA     Referring MD: Wyn Jackee VEAR Mickey., NP   History of Present Illness:    Brian Mccullough is a 73 y.o. male with a medical history significant for CAD with CABG x6, carotid disease, HTN, HLD, referred for management of SVT.      Discussed the use of AI scribe software for clinical note transcription with the patient, who gave verbal consent to proceed.  History of Present Illness Brian Mccullough is a 73 year old male with coronary artery disease who presents with palpitations and supraventricular tachycardia. He was referred for palpitations and SVT.  He has experienced palpitations for most of his life, initially attributing them to exertion during activities like basketball. As an adult, he recognized these sensations as palpitations. Typically, these episodes last less than 30 seconds and resolve spontaneously. However, on April 29, he experienced a prolonged episode lasting about three hours, which was different from his usual episodes. The palpitations occur without a clear trigger, although sometimes bending at the waist may precipitate them. They can also occur while he is at rest.  A 14-day cardiac monitoring revealed episodes of SVT. Notably, on June 7, his cardiac monitor recorded an episode of tachycardia with 2:1 conduction. He has not identified any specific interventions that consistently alleviate the palpitations, although he sometimes tries bearing down to see if it helps.         Today, he reports that he feels well. No new complaints.  EKGs/Labs/Other Studies Reviewed Today:     Echocardiogram:  TTE November 2022 LVEF 60 to 65%.  Left atrium mildly dilated.   Monitors:  14 day monitor 02/2024  -- my  interpretation Sinus rhythm.  Heart rate 45 to 143 bpm, average 72 bpm Multiple episodes of SVT were noted.  1 episode on June 7 at 12:34 PM appears to be an atrial tachycardia with 2-1 conduction  Stress testing:  Exercise tolerance test November 2023 Normal response to exercise    EKG:   EKG Interpretation Date/Time:  Wednesday April 09 2024 08:59:40 EDT Ventricular Rate:  69 PR Interval:  182 QRS Duration:  104 QT Interval:  396 QTC Calculation: 424 R Axis:   -64  Text Interpretation: Sinus rhythm with Premature atrial complexes Incomplete right bundle branch block Left anterior fascicular block Inferior infarct , age undetermined When compared with ECG of 26-Feb-2024 10:21, Premature atrial complexes are now Present Confirmed by Nancey Scotts [id number]) on 04/09/2024 9:16:16 AM     Physical Exam:    VS:  BP 110/70 (BP Location: Left Arm, Patient Position: Sitting, Cuff Size: Large)   Pulse 69   Ht 6' 3 (1.905 m)   Wt 244 lb (110.7 kg)   SpO2 98%   BMI 30.50 kg/m     Wt Readings from Last 3 Encounters:  04/09/24 244 lb (110.7 kg)  02/26/24 246 lb (111.6 kg)  02/11/24 246 lb 12.8 oz (111.9 kg)     GEN: Well nourished, well developed in no acute distress CARDIAC: RRR, no murmurs, rubs, gallops RESPIRATORY:  Normal work of breathing MUSCULOSKELETAL: no edema    ASSESSMENT & PLAN:     SVT At least 1 episode during monitor appears to show 2-1 atrial tachycardia  Another episode appears to be initiated with a PAC and prolonged AV conduction resulting in a 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 We discussed management options, and using a shared decision making approach opted to proceed with EP study and possible ablation of an SVT In addition to possible AVNRT, I suspect he also has frequent PACs and possibly brief atrial tachycardia, which will be less amenable to ablation though more easily managed with medications  We discussed  the indication, rationale, logistics, anticipated benefits, and potential risks of the ablation procedure including but not limited to -- bleed at the groin access site, chest pain, damage to nearby organs such as the diaphragm, lungs, or esophagus, need for a drainage tube, pacemaker, or prolonged hospitalization. I explained that the risk for stroke, heart attack, need for open chest surgery, or even death is very low but not zero. he  expressed understanding and wishes to proceed.   Coronary artery disease 6 vessel CABG in 2021 Continue aspirin  81 mg, Crestor  5 mg, Jardiance  10 mg, metoprolol  12.5 mg daily      Signed, Eulas FORBES Furbish, MD  04/09/2024 9:18 AM    [facility] "

## 2024-04-09 ENCOUNTER — Encounter: Payer: Self-pay | Admitting: Cardiovascular Disease

## 2024-04-09 ENCOUNTER — Ambulatory Visit: Attending: Cardiology | Admitting: Cardiovascular Disease

## 2024-04-09 VITALS — BP 110/70 | HR 69 | Ht 75.0 in | Wt 244.0 lb

## 2024-04-09 DIAGNOSIS — R002 Palpitations: Secondary | ICD-10-CM | POA: Diagnosis not present

## 2024-04-09 DIAGNOSIS — I471 Supraventricular tachycardia, unspecified: Secondary | ICD-10-CM

## 2024-04-09 NOTE — Patient Instructions (Signed)
 Medication Instructions:  Your physician recommends that you continue on your current medications as directed. Please refer to the Current Medication list given to you today. *If you need a refill on your cardiac medications before your next appointment, please call your pharmacy*  Lab Work: CBC and BMET - please have pre-procedure lab work completed on Friday, August 1. This can be done at ANY [facility] near you - no appointment required and this does not have to be fasting. If you have labs (blood work) drawn today and your tests are completely normal, you will receive your results only by: MyChart Message (if you have MyChart) OR A paper copy in the mail If you have any lab test that is abnormal or we need to change your treatment, we will call you to review the results.  Testing/Procedures: EP Study with possible SVT Ablation - scheduled on Friday, August 29. We will be in contact closer to your ablation date with further instructions Your physician has recommended that you have an ablation. Catheter ablation is a medical procedure used to treat some cardiac arrhythmias (irregular heartbeats). During catheter ablation, a long, thin, flexible tube is put into a blood vessel in your groin (upper thigh), or neck. This tube is called an ablation catheter. It is then guided to your heart through the blood vessel. Radio frequency waves destroy small areas of heart tissue where abnormal heartbeats may cause an arrhythmia to start. Please see the instruction sheet given to you today.  Follow-Up: At [facility], you and your health needs are our priority.  As part of our continuing mission to provide you with exceptional heart care, our providers are all part of one team.  This team includes your primary Cardiologist (physician) and Advanced Practice Providers or APPs (Physician Assistants and Nurse Practitioners) who all work together to provide you with the care you need, when you need  it.  Your next appointment:   We will schedule follow up after your ablation  Provider:   Eulas Furbish, MD   Cardiac Ablation Cardiac ablation is a procedure to destroy, or ablate, a small amount of heart tissue that is causing problems. The heart has many electrical connections. Sometimes, these connections are abnormal and can cause the heart to beat very fast or irregularly. Ablating the abnormal areas can improve the heart's rhythm or return it to normal. Ablation may be done for people who: Have irregular or rapid heartbeats (arrhythmias). Have Wolff-Parkinson-White syndrome. Have taken medicines for an arrhythmia that did not work or caused side effects. Have a high-risk heartbeat that may be life-threatening. Tell a health care provider about: Any allergies you have. All medicines you are taking, including vitamins, herbs, eye drops, creams, and over-the-counter medicines. Any problems you or family members have had with anesthesia. Any bleeding problems you have. Any surgeries you have had. Any medical conditions you have. Whether you are pregnant or may be pregnant. What are the risks? Your health care provider will talk with you about risks. These may include: Infection. Bruising and bleeding. Stroke or blood clots. Damage to nearby structures or organs. Allergic reaction to medicines or dyes. Needing a pacemaker if the heart gets damaged. A pacemaker is a device that helps the heart beat normally. Failure of the procedure. A repeat procedure may be needed. What happens before the procedure? Medicines Ask your health care provider about: Changing or stopping your regular medicines. These include any heart rhythm medicines, diabetes medicines, or blood thinners you take. Taking  medicines such as aspirin  and ibuprofen . These medicines can thin your blood. Do not take them unless your health care provider tells you to. Taking over-the-counter medicines, vitamins, herbs,  and supplements. General instructions Follow instructions from your health care provider about what you may eat and drink. If you will be going home right after the procedure, plan to have a responsible adult: Take you home from the hospital or clinic. You will not be allowed to drive. Care for you for the time you are told. Ask your health care provider what steps will be taken to prevent infection. What happens during the procedure?  An IV will be inserted into one of your veins. You may be given: A sedative. This helps you relax. Anesthesia. This will: Numb certain areas of your body. An incision will be made in your neck or your groin. A needle will be inserted through the incision and into a large vein in your neck or groin. The small, thin tube (catheter) will be inserted through the needle and moved to your heart. A type of X-ray (fluoroscopy) will be used to help guide the catheter and provide images of the heart on a monitor. Dye may be injected through the catheter to help your surgeon see the area of the heart that needs treatment. Electrical currents will be sent from the catheter to destroy heart tissue in certain areas. There are three types of energy that may be used to do this: Heat (radiofrequency energy). Laser energy. Extreme cold (cryoablation). When the tissue has been destroyed, the catheter will be removed. Pressure will be held on the insertion area to prevent bleeding. A bandage (dressing) will be placed over the insertion area. The procedure may vary among health care providers and hospitals. What happens after the procedure? Your blood pressure, heart rate and rhythm, breathing rate, and blood oxygen level will be monitored until you leave the hospital or clinic. Your insertion area will be checked for bleeding. You will need to lie still for a few hours. If your groin was used, you will need to keep your leg straight for a few hours after the catheter is  removed. This information is not intended to replace advice given to you by your health care provider. Make sure you discuss any questions you have with your health care provider. Document Revised: 03/14/2022 Document Reviewed: 03/14/2022 Elsevier Patient Education  2024 [employer].

## 2024-05-01 DIAGNOSIS — H25012 Cortical age-related cataract, left eye: Secondary | ICD-10-CM | POA: Diagnosis not present

## 2024-05-01 DIAGNOSIS — H52223 Regular astigmatism, bilateral: Secondary | ICD-10-CM | POA: Diagnosis not present

## 2024-05-01 DIAGNOSIS — H524 Presbyopia: Secondary | ICD-10-CM | POA: Diagnosis not present

## 2024-05-01 DIAGNOSIS — H5201 Hypermetropia, right eye: Secondary | ICD-10-CM | POA: Diagnosis not present

## 2024-05-01 DIAGNOSIS — H2511 Age-related nuclear cataract, right eye: Secondary | ICD-10-CM | POA: Diagnosis not present

## 2024-05-01 DIAGNOSIS — H43392 Other vitreous opacities, left eye: Secondary | ICD-10-CM | POA: Diagnosis not present

## 2024-05-01 DIAGNOSIS — H5212 Myopia, left eye: Secondary | ICD-10-CM | POA: Diagnosis not present

## 2024-05-01 DIAGNOSIS — E119 Type 2 diabetes mellitus without complications: Secondary | ICD-10-CM | POA: Diagnosis not present

## 2024-05-01 LAB — HM DIABETES EYE EXAM

## 2024-05-09 ENCOUNTER — Other Ambulatory Visit: Payer: Self-pay | Admitting: Internal Medicine

## 2024-05-09 DIAGNOSIS — R002 Palpitations: Secondary | ICD-10-CM | POA: Diagnosis not present

## 2024-05-09 DIAGNOSIS — I471 Supraventricular tachycardia, unspecified: Secondary | ICD-10-CM | POA: Diagnosis not present

## 2024-05-10 LAB — BASIC METABOLIC PANEL WITH GFR
BUN/Creatinine Ratio: 12 (ref 10–24)
BUN: 17 mg/dL (ref 8–27)
CO2: 21 mmol/L (ref 20–29)
Calcium: 9.7 mg/dL (ref 8.6–10.2)
Chloride: 105 mmol/L (ref 96–106)
Creatinine, Ser: 1.43 mg/dL — ABNORMAL HIGH (ref 0.76–1.27)
Glucose: 76 mg/dL (ref 70–99)
Potassium: 4.6 mmol/L (ref 3.5–5.2)
Sodium: 140 mmol/L (ref 134–144)
eGFR: 52 mL/min/1.73 — ABNORMAL LOW (ref >59)

## 2024-05-10 LAB — CBC
Hematocrit: 43.4 % (ref 37.5–51.0)
Hemoglobin: 13.9 g/dL (ref 13.0–17.7)
MCH: 28.1 pg (ref 26.6–33.0)
MCHC: 32 g/dL (ref 31.5–35.7)
MCV: 88 fL (ref 79–97)
Platelets: 218 x10E3/uL (ref 150–450)
RBC: 4.94 x10E6/uL (ref 4.14–5.80)
RDW: 13.9 % (ref 11.6–15.4)
WBC: 5.1 x10E3/uL (ref 3.4–10.8)

## 2024-05-12 ENCOUNTER — Ambulatory Visit: Payer: Self-pay

## 2024-05-13 ENCOUNTER — Encounter: Payer: Self-pay | Admitting: Internal Medicine

## 2024-05-13 ENCOUNTER — Ambulatory Visit: Admitting: Internal Medicine

## 2024-05-13 VITALS — BP 132/88 | HR 79 | Temp 98.8°F | Ht 75.0 in | Wt 243.0 lb

## 2024-05-13 DIAGNOSIS — N183 Chronic kidney disease, stage 3 unspecified: Secondary | ICD-10-CM

## 2024-05-13 DIAGNOSIS — Z7985 Long-term (current) use of injectable non-insulin antidiabetic drugs: Secondary | ICD-10-CM | POA: Diagnosis not present

## 2024-05-13 DIAGNOSIS — E1165 Type 2 diabetes mellitus with hyperglycemia: Secondary | ICD-10-CM | POA: Diagnosis not present

## 2024-05-13 DIAGNOSIS — E538 Deficiency of other specified B group vitamins: Secondary | ICD-10-CM

## 2024-05-13 DIAGNOSIS — I471 Supraventricular tachycardia, unspecified: Secondary | ICD-10-CM

## 2024-05-13 MED ORDER — TRIAMCINOLONE ACETONIDE 0.1 % EX OINT: 1.0000 | TOPICAL_OINTMENT | Freq: Two times a day (BID) | CUTANEOUS | 3 refills | Status: DC | PRN

## 2024-05-13 NOTE — Assessment & Plan Note (Signed)
Ablationis pending 

## 2024-05-13 NOTE — Progress Notes (Signed)
 Subjective:  Patient ID: Brian Mccullough, male    DOB: [birth year]  Age: 73 y.o. MRN: [id number]  CC: No chief complaint on file.   HPI DIANGELO RADEL presents for SVT - ablation is pending, DM, dermatitis  Outpatient Medications Prior to Visit  Medication Sig Dispense Refill   acetaminophen  (TYLENOL ) 500 MG tablet Take 1,000 mg by mouth every 6 (six) hours as needed for headache or moderate pain. (Patient not taking: Reported on 04/09/2024)     aspirin  81 MG EC tablet Take 81 mg by mouth daily.     benazepril  (LOTENSIN ) 20 MG tablet TAKE 1 TABLET BY MOUTH DAILY 100 tablet 2   cholecalciferol  (VITAMIN D3) 25 MCG (1000 UNIT) tablet Take 1,000 Units by mouth daily.     cyanocobalamin  1000 MCG tablet Take 1,000 mcg by mouth daily.     ezetimibe  (ZETIA ) 10 MG tablet TAKE 1 TABLET BY MOUTH EVERY DAY 90 tablet 3   furosemide  (LASIX ) 20 MG tablet TAKE 1 TABLET BY MOUTH AS NEEDED FOR FLUID OR EDEMA. MAX 1 TABLET PER DAY 100 tablet 2   JARDIANCE  10 MG TABS tablet TAKE 1 TABLET BY MOUTH EVERY DAY 30 tablet 11   loratadine  (CLARITIN ) 10 MG tablet Take 10 mg by mouth daily as needed for allergies or rhinitis.     meloxicam  (MOBIC ) 15 MG tablet TAKE 1 TABLET BY MOUTH DAILY AS  NEEDED FOR PAIN 100 tablet 0   metFORMIN  (GLUCOPHAGE ) 500 MG tablet TAKE 1 TABLET BY MOUTH DAILY  WITH BREAKFAST 100 tablet 2   metoprolol  tartrate (LOPRESSOR ) 25 MG tablet TAKE 0.5 TABLETS (12.5 MG TOTAL) BY MOUTH DAILY. CAN TAKE AN ADDITIONAL HALF TABLET DAILY AS NEEDED 90 tablet 3   nitroGLYCERIN  (NITROSTAT ) 0.4 MG SL tablet Place 1 tablet (0.4 mg total) under the tongue every 5 (five) minutes as needed for chest pain. 30 tablet 3   rosuvastatin  (CRESTOR ) 5 MG tablet TAKE 1 TABLET (5 MG TOTAL) BY MOUTH DAILY. 90 tablet 2   Semaglutide ,0.25 or 0.5MG /DOS, (OZEMPIC , 0.25 OR 0.5 MG/DOSE,) 2 MG/3ML SOPN Inject 0.5 mg into the skin once a week. 9 mL 2   tiZANidine  (ZANAFLEX ) 4 MG tablet Take 1 tablet (4 mg total) by mouth every 8  (eight) hours as needed for muscle spasms. (Patient not taking: Reported on 04/09/2024) 90 tablet 0   traMADol  (ULTRAM ) 50 MG tablet TAKE 1-2 TABLETS (50-100 MG TOTAL) BY MOUTH EVERY 6 (SIX) HOURS AS NEEDED FOR MODERATE PAIN OR SEVERE PAIN. 120 tablet 3   traZODone  (DESYREL ) 50 MG tablet TAKE 1/2 TO 1 TABLET BY MOUTH AT BEDTIME AS NEEDED FOR SLEEP (Patient not taking: Reported on 04/09/2024) 90 tablet 3   triamcinolone  ointment (KENALOG ) 0.1 % Apply 1 application. topically 2 (two) times daily as needed (rash). 90 g 3   No facility-administered medications prior to visit.    ROS: Review of Systems  Constitutional:  Negative for appetite change, fatigue and unexpected weight change.  HENT:  Negative for congestion, nosebleeds, sneezing, sore throat and trouble swallowing.   Eyes:  Negative for itching and visual disturbance.  Respiratory:  Negative for cough.   Cardiovascular:  Positive for palpitations. Negative for chest pain and leg swelling.  Gastrointestinal:  Negative for abdominal distention, blood in stool, diarrhea and nausea.  Genitourinary:  Negative for frequency and hematuria.  Musculoskeletal:  Negative for back pain, gait problem, joint swelling and neck pain.  Skin:  Negative for rash.  Neurological:  Negative for  dizziness, tremors, speech difficulty and weakness.  Psychiatric/Behavioral:  Negative for agitation, dysphoric mood and sleep disturbance. The patient is not nervous/anxious.     Objective:  BP 132/88   Pulse 79   Temp 98.8 F (37.1 C) (Oral)   Ht 6' 3 (1.905 m)   Wt 243 lb (110.2 kg)   SpO2 98%   BMI 30.37 kg/m   BP Readings from Last 3 Encounters:  05/13/24 132/88  04/09/24 110/70  02/26/24 102/62    Wt Readings from Last 3 Encounters:  05/13/24 243 lb (110.2 kg)  04/09/24 244 lb (110.7 kg)  02/26/24 246 lb (111.6 kg)    Physical Exam Constitutional:      General: He is not in acute distress.    Appearance: He is well-developed.     Comments:  NAD  Eyes:     Conjunctiva/sclera: Conjunctivae normal.     Pupils: Pupils are equal, round, and reactive to light.  Neck:     Thyroid : No thyromegaly.     Vascular: No JVD.  Cardiovascular:     Rate and Rhythm: Normal rate and regular rhythm.     Heart sounds: Normal heart sounds. No murmur heard.    No friction rub. No gallop.  Pulmonary:     Effort: Pulmonary effort is normal. No respiratory distress.     Breath sounds: Normal breath sounds. No wheezing or rales.  Chest:     Chest wall: No tenderness.  Abdominal:     General: Bowel sounds are normal. There is no distension.     Palpations: Abdomen is soft. There is no mass.     Tenderness: There is no abdominal tenderness. There is no guarding or rebound.  Musculoskeletal:        General: No tenderness. Normal range of motion.     Cervical back: Normal range of motion.  Lymphadenopathy:     Cervical: No cervical adenopathy.  Skin:    General: Skin is warm and dry.     Findings: No rash.  Neurological:     Mental Status: He is alert and oriented to person, place, and time.     Cranial Nerves: No cranial nerve deficit.     Motor: No abnormal muscle tone.     Coordination: Coordination normal.     Gait: Gait normal.     Deep Tendon Reflexes: Reflexes are normal and symmetric.  Psychiatric:        Behavior: Behavior normal.        Thought Content: Thought content normal.        Judgment: Judgment normal.     Lab Results  Component Value Date   WBC 5.1 05/09/2024   HGB 13.9 05/09/2024   HCT 43.4 05/09/2024   PLT 218 05/09/2024   GLUCOSE 76 05/09/2024   CHOL 117 04/06/2023   TRIG 110 04/06/2023   HDL 41 04/06/2023   LDLDIRECT 88.1 01/23/2011   LDLCALC 56 04/06/2023   ALT 7 02/11/2024   AST 13 02/11/2024   NA 140 05/09/2024   K 4.6 05/09/2024   CL 105 05/09/2024   CREATININE 1.43 (H) 05/09/2024   BUN 17 05/09/2024   CO2 21 05/09/2024   TSH 0.60 02/05/2023   PSA 0.27 02/05/2023   INR 1.5 (H) 08/04/2020    HGBA1C 5.8 02/11/2024   MICROALBUR <0.2 04/26/2020    No results found.  Assessment & Plan:   Problem List Items Addressed This Visit     B12 deficiency   On B12  CRI (chronic renal insufficiency), stage 3 (moderate) (HCC)   GFR 48-53 Multifactorial Hydrate well On Jardiance         Diabetes mellitus type 2, controlled (HCC) - Primary   On Ozempic  0.5 mg/wk      Relevant Orders   Hemoglobin A1c   Comprehensive metabolic panel with GFR   SVT (supraventricular tachycardia) (HCC)   Ablation is pending      Relevant Orders   Hemoglobin A1c   Comprehensive metabolic panel with GFR      Meds ordered this encounter  Medications   triamcinolone  ointment (KENALOG ) 0.1 %    Sig: Apply 1 Application topically 2 (two) times daily as needed (rash).    Dispense:  90 g    Refill:  3       Follow-up: No follow-ups on file.  Marolyn Noel, MD

## 2024-05-13 NOTE — Assessment & Plan Note (Signed)
 On B12

## 2024-05-13 NOTE — Assessment & Plan Note (Signed)
 On Ozempic  0.5 mg/wk

## 2024-05-13 NOTE — Assessment & Plan Note (Signed)
 GFR 48-53 Multifactorial Hydrate well On Jardiance

## 2024-05-20 ENCOUNTER — Other Ambulatory Visit: Payer: Self-pay | Admitting: Internal Medicine

## 2024-05-20 MED ORDER — TRIAMCINOLONE ACETONIDE 0.1 % EX OINT: 1.0000 | TOPICAL_OINTMENT | Freq: Two times a day (BID) | CUTANEOUS | 3 refills | Status: AC | PRN

## 2024-05-20 NOTE — Telephone Encounter (Signed)
 Copied from CRM [id number]. Topic: Clinical - Medication Refill >> May 20, 2024  9:33 AM Paige D wrote: Medication: triamcinolone  ointment (KENALOG ) 0.1 %  Has the patient contacted their pharmacy? Yes (Agent: If no, request that the patient contact the pharmacy for the refill. If patient does not wish to contact the pharmacy document the reason why and proceed with request.) (Agent: If yes, when and what did the pharmacy advise?)  This is the patient's preferred pharmacy:    [id number] - [place], [place] - [address] AT [facility] [address] [place] [address] Phone: [phone] Fax: [phone]   Is this the correct pharmacy for this prescription? Yes If no, delete pharmacy and type the correct one.   Has the prescription been filled recently? No  Is the patient out of the medication? Yes  Has the patient been seen for an appointment in the last year OR does the patient have an upcoming appointment? Yes  Can we respond through MyChart? Yes  Agent: Please be advised that Rx refills may take up to 3 business days. We ask that you follow-up with your pharmacy.

## 2024-05-30 ENCOUNTER — Telehealth (HOSPITAL_COMMUNITY): Payer: Self-pay

## 2024-05-30 NOTE — Telephone Encounter (Signed)
 Spoke with patient to discuss upcoming procedure.   Labs: completed.   Any recent signs of acute illness or been started on antibiotics? No Any new medications started? No Any medications to hold?  Hold Semaglutide  for 1 week prior to the procedure. Last dose taken on August 16. Hold Metoprolol  for 5 days prior to your procedure. Last dose on August 23. Hold Jardiance  for 3 days prior to the procedure. Last dose on August 25.   Medication instructions:  On the morning of your procedure DO NOT take any medication. or the procedure may be rescheduled. Nothing to eat or drink after midnight prior to your procedure.  Confirmed patient is scheduled for SVT Ablation on Friday, August 29 with Dr. Eulas Furbish. Instructed patient to arrive at the Main Entrance A at [facility]: [address] [place], [place] [address] and check in at Admitting at 10:30 AM   Advised of plan to go home the same day and will only stay overnight if medically necessary. You MUST have a responsible adult to drive you home and MUST be with you the first 24 hours after you arrive home or your procedure could be cancelled.  Patient verbalized understanding to all instructions provided and agreed to proceed with procedure.

## 2024-06-03 ENCOUNTER — Other Ambulatory Visit: Payer: Self-pay | Admitting: Internal Medicine

## 2024-06-05 NOTE — Pre-Procedure Instructions (Signed)
 Instructed patient on the following items: Arrival time 1000 Nothing to eat or drink after midnight No meds AM of procedure Responsible person to drive you home and stay with you for 24 hrs

## 2024-06-06 ENCOUNTER — Ambulatory Visit (HOSPITAL_COMMUNITY): Admitting: Registered Nurse

## 2024-06-06 ENCOUNTER — Encounter (HOSPITAL_COMMUNITY)
Admission: RE | Disposition: A | Discharge status: Home / Self Care | Payer: Self-pay | Source: Home / Self Care | Attending: Cardiovascular Disease

## 2024-06-06 ENCOUNTER — Ambulatory Visit (HOSPITAL_COMMUNITY)
Admission: RE | Admit: 2024-06-06 | Discharge: 2024-06-06 | Disposition: A | Discharge status: Home / Self Care | Attending: Cardiovascular Disease | Admitting: Cardiovascular Disease

## 2024-06-06 ENCOUNTER — Ambulatory Visit (HOSPITAL_BASED_OUTPATIENT_CLINIC_OR_DEPARTMENT_OTHER): Admitting: Registered Nurse

## 2024-06-06 ENCOUNTER — Other Ambulatory Visit: Payer: Self-pay

## 2024-06-06 DIAGNOSIS — I10 Essential (primary) hypertension: Secondary | ICD-10-CM | POA: Diagnosis not present

## 2024-06-06 DIAGNOSIS — E119 Type 2 diabetes mellitus without complications: Secondary | ICD-10-CM | POA: Insufficient documentation

## 2024-06-06 DIAGNOSIS — I471 Supraventricular tachycardia, unspecified: Secondary | ICD-10-CM | POA: Diagnosis not present

## 2024-06-06 DIAGNOSIS — Z87891 Personal history of nicotine dependence: Secondary | ICD-10-CM | POA: Insufficient documentation

## 2024-06-06 DIAGNOSIS — Z7982 Long term (current) use of aspirin: Secondary | ICD-10-CM | POA: Insufficient documentation

## 2024-06-06 DIAGNOSIS — I252 Old myocardial infarction: Secondary | ICD-10-CM | POA: Insufficient documentation

## 2024-06-06 DIAGNOSIS — Z7984 Long term (current) use of oral hypoglycemic drugs: Secondary | ICD-10-CM | POA: Insufficient documentation

## 2024-06-06 DIAGNOSIS — Z79899 Other long term (current) drug therapy: Secondary | ICD-10-CM | POA: Insufficient documentation

## 2024-06-06 DIAGNOSIS — I4719 Other supraventricular tachycardia: Secondary | ICD-10-CM | POA: Diagnosis not present

## 2024-06-06 DIAGNOSIS — I2510 Atherosclerotic heart disease of native coronary artery without angina pectoris: Secondary | ICD-10-CM | POA: Insufficient documentation

## 2024-06-06 DIAGNOSIS — Z951 Presence of aortocoronary bypass graft: Secondary | ICD-10-CM | POA: Diagnosis not present

## 2024-06-06 DIAGNOSIS — I739 Peripheral vascular disease, unspecified: Secondary | ICD-10-CM | POA: Diagnosis not present

## 2024-06-06 DIAGNOSIS — E785 Hyperlipidemia, unspecified: Secondary | ICD-10-CM | POA: Insufficient documentation

## 2024-06-06 HISTORY — PX: SVT ABLATION: EP1225

## 2024-06-06 LAB — GLUCOSE, CAPILLARY
Glucose-Capillary: 93 mg/dL (ref 70–99)
Glucose-Capillary: 88 mg/dL (ref 70–99)
Glucose-Capillary: 80 mg/dL (ref 70–99)
Glucose-Capillary: 103 mg/dL — ABNORMAL HIGH (ref 70–99)

## 2024-06-06 MED ORDER — ISOPROTERENOL HCL 0.2 MG/ML IJ SOLN
INTRAVENOUS | Status: DC | PRN
  Administered 2024-06-06: 2 ug/min via INTRAVENOUS

## 2024-06-06 MED ORDER — ONDANSETRON HCL 4 MG/2ML IJ SOLN
4.0000 mg | Freq: Four times a day (QID) | INTRAMUSCULAR | Status: DC | PRN
Start: 2024-06-06 — End: 2024-06-06

## 2024-06-06 MED ORDER — MIDAZOLAM HCL 2 MG/2ML IJ SOLN
INTRAMUSCULAR | Status: AC
  Filled 2024-06-06: qty 2

## 2024-06-06 MED ORDER — FENTANYL CITRATE (PF) 100 MCG/2ML IJ SOLN
INTRAMUSCULAR | Status: AC
  Filled 2024-06-06: qty 2

## 2024-06-06 MED ORDER — HEPARIN (PORCINE) IN NACL 1000-0.9 UT/500ML-% IV SOLN
INTRAVENOUS | Status: DC | PRN
  Administered 2024-06-06: 500 mL

## 2024-06-06 MED ORDER — MIDAZOLAM HCL 5 MG/5ML IJ SOLN
INTRAMUSCULAR | Status: DC | PRN
Start: 2024-06-06 — End: 2024-06-06
  Administered 2024-06-06 (×2): 1 mg via INTRAVENOUS

## 2024-06-06 MED ORDER — SODIUM CHLORIDE 0.9 % IV SOLN: INTRAVENOUS | Status: DC

## 2024-06-06 MED ORDER — BUPIVACAINE HCL (PF) 0.25 % IJ SOLN
INTRAMUSCULAR | Status: AC
  Filled 2024-06-06: qty 30

## 2024-06-06 MED ORDER — BUPIVACAINE HCL (PF) 0.25 % IJ SOLN
INTRAMUSCULAR | Status: DC | PRN
  Administered 2024-06-06: 30 mL

## 2024-06-06 MED ORDER — ISOPROTERENOL HCL 0.2 MG/ML IJ SOLN
INTRAMUSCULAR | Status: AC
  Filled 2024-06-06: qty 5

## 2024-06-06 MED ORDER — FENTANYL CITRATE (PF) 100 MCG/2ML IJ SOLN
INTRAMUSCULAR | Status: DC | PRN
  Administered 2024-06-06 (×2): 50 ug via INTRAVENOUS

## 2024-06-06 MED ORDER — ACETAMINOPHEN 325 MG PO TABS: 650.0000 mg | ORAL_TABLET | ORAL | Status: DC | PRN

## 2024-06-06 MED ORDER — MIDAZOLAM HCL 2 MG/2ML IJ SOLN
INTRAMUSCULAR | Status: AC
Start: 2024-06-06 — End: 2024-06-06
  Filled 2024-06-06: qty 2

## 2024-06-06 NOTE — Discharge Instructions (Signed)

## 2024-06-06 NOTE — Anesthesia Postprocedure Evaluation (Signed)
 Anesthesia Post Note  Patient: Brian Mccullough  Procedure(s) Performed: SVT ABLATION     Patient location during evaluation: PACU Anesthesia Type: MAC Level of consciousness: awake and alert Pain management: pain level controlled Vital Signs Assessment: post-procedure vital signs reviewed and stable Respiratory status: spontaneous breathing, nonlabored ventilation, respiratory function stable and patient connected to nasal cannula oxygen Cardiovascular status: stable and blood pressure returned to baseline Postop Assessment: no apparent nausea or vomiting Anesthetic complications: no   There were no known notable events for this encounter.  Last Vitals:  Vitals:   06/06/24 1430 06/06/24 1445  BP: (!) 140/84 128/81  Pulse: 72 70  Resp: 13 16  Temp:    SpO2: 100% 97%    Last Pain:  Vitals:   06/06/24 1345  TempSrc: Oral                 Oriya Kettering L Mollye Guinta

## 2024-06-06 NOTE — Transfer of Care (Signed)
 Immediate Anesthesia Transfer of Care Note  Patient: Brian Mccullough  Procedure(s) Performed: SVT ABLATION  Patient Location: PACU  Anesthesia Type:MAC  Level of Consciousness: awake, alert , and oriented  Airway & Oxygen Therapy: Patient Spontanous Breathing  Post-op Assessment: Report given to RN and Post -op Vital signs reviewed and stable  Post vital signs: Reviewed and stable  Last Vitals:  Vitals Value Taken Time  BP 156/80   Temp    Pulse 71 06/06/24 13:42  Resp 8 06/06/24 13:42  SpO2 98 % 06/06/24 13:42  Vitals shown include unfiled device data.  Last Pain:  Vitals:   06/06/24 1029  TempSrc: Oral         Complications: There were no known notable events for this encounter.

## 2024-06-06 NOTE — Anesthesia Preprocedure Evaluation (Addendum)
 Anesthesia Evaluation  Patient identified by MRN, date of birth, ID band Patient awake    Reviewed: Allergy & Precautions, NPO status , Patient's Chart, lab work & pertinent test results, reviewed documented beta blocker date and time   Airway Mallampati: I  TM Distance: >3 FB Neck ROM: Full    Dental  (+) Edentulous Upper, Edentulous Lower, Dental Advisory Given   Pulmonary former smoker   Pulmonary exam normal breath sounds clear to auscultation       Cardiovascular hypertension, Pt. on home beta blockers and Pt. on medications + CAD, + Past MI, + CABG and + Peripheral Vascular Disease  Normal cardiovascular exam+ dysrhythmias Supra Ventricular Tachycardia  Rhythm:Regular Rate:Normal  TTE 2022  1. Left ventricular ejection fraction, by estimation, is 60 to 65%. The  left ventricle has normal function. The left ventricle has no regional  wall motion abnormalities. Left ventricular diastolic parameters were  normal.   2. Right ventricular systolic function is normal. The right ventricular  size is normal.   3. Left atrial size was mildly dilated.   4. The mitral valve is normal in structure. No evidence of mitral valve  regurgitation. No evidence of mitral stenosis.   5. The aortic valve is normal in structure. Aortic valve regurgitation is  not visualized. No aortic stenosis is present.   6. The inferior vena cava is normal in size with greater than 50%  respiratory variability, suggesting right atrial pressure of 3 mmHg.   Stress Test 2023 negative    Neuro/Psych  PSYCHIATRIC DISORDERS Anxiety     negative neurological ROS     GI/Hepatic negative GI ROS,,,(+)     substance abuse  alcohol use  Endo/Other  diabetes, Type 2, Oral Hypoglycemic Agents    Renal/GU Renal InsufficiencyRenal disease  negative genitourinary   Musculoskeletal  (+) Arthritis ,    Abdominal   Peds  Hematology negative hematology  ROS (+)   Anesthesia Other Findings   Reproductive/Obstetrics                              Anesthesia Physical Anesthesia Plan  ASA: 3  Anesthesia Plan: MAC   Post-op Pain Management: Minimal or no pain anticipated   Induction: Intravenous  PONV Risk Score and Plan: 1 and Propofol  infusion, Treatment may vary due to age or medical condition and Ondansetron   Airway Management Planned: Natural Airway  Additional Equipment:   Intra-op Plan:   Post-operative Plan:   Informed Consent: I have reviewed the patients History and Physical, chart, labs and discussed the procedure including the risks, benefits and alternatives for the proposed anesthesia with the patient or authorized representative who has indicated his/her understanding and acceptance.     Dental advisory given  Plan Discussed with: CRNA  Anesthesia Plan Comments:          Anesthesia Quick Evaluation

## 2024-06-06 NOTE — H&P (Signed)
 Electrophysiology Office Note:    Date:  06/06/2024   ID:  Brian Mccullough, DOB Dec 16, 1950, MRN [id number]  PCP:  Brian Karlynn GAILS, MD   [facility] Providers Cardiologist:  Brian Fell, MD Cardiology APP:  Brian Glendia DASEN, PA-C  Electrophysiologist:  Brian FORBES Furbish, MD     Referring MD: No ref. provider found   History of Present Illness:    Brian Mccullough is a 73 y.o. male with a medical history significant for CAD with CABG x6, carotid disease, HTN, HLD, referred for management of SVT.      Discussed the use of AI scribe software for clinical note transcription with the patient, who gave verbal consent to proceed.  History of Present Illness Brian Mccullough is a 73 year old male with coronary artery disease who presents with palpitations and supraventricular tachycardia. He was referred for palpitations and SVT.  He has experienced palpitations for most of his life, initially attributing them to exertion during activities like basketball. As an adult, he recognized these sensations as palpitations. Typically, these episodes last less than 30 seconds and resolve spontaneously. However, on April 29, he experienced a prolonged episode lasting about three hours, which was different from his usual episodes. The palpitations occur without a clear trigger, although sometimes bending at the waist may precipitate them. They can also occur while he is at rest.  A 14-day cardiac monitoring revealed episodes of SVT. Notably, on June 7, his cardiac monitor recorded an episode of tachycardia with 2:1 conduction. He has not identified any specific interventions that consistently alleviate the palpitations, although he sometimes tries bearing down to see if it helps.         Today, he reports that he feels well. No new complaints. He presents today for EP study and ablation of SVT.   EKGs/Labs/Other Studies Reviewed Today:     Echocardiogram:  TTE November  2022 LVEF 60 to 65%.  Left atrium mildly dilated.   Monitors:  14 day monitor 02/2024  -- my interpretation Sinus rhythm.  Heart rate 45 to 143 bpm, average 72 bpm Multiple episodes of SVT were noted.  1 episode on June 7 at 12:34 PM appears to be an atrial tachycardia with 2-1 conduction  Stress testing:  Exercise tolerance test November 2023 Normal response to exercise    EKG:         Physical Exam:    VS:  BP 138/87   Pulse 80   Temp 99.7 F (37.6 C) (Oral)   Resp 18   Ht 6' 3 (1.905 m)   Wt 108.9 kg   SpO2 100%   BMI 30.00 kg/m     Wt Readings from Last 3 Encounters:  06/06/24 108.9 kg  05/13/24 110.2 kg  04/09/24 110.7 kg     GEN: Well nourished, well developed in no acute distress CARDIAC: RRR, no murmurs, rubs, gallops RESPIRATORY:  Normal work of breathing MUSCULOSKELETAL: no edema    ASSESSMENT & PLAN:     SVT At least 1 episode during monitor appears to show 2-1 atrial tachycardia Another episode appears to be initiated with a PAC and prolonged AV conduction resulting in a 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 We discussed management options, and using a shared decision making approach opted to proceed with EP study and possible ablation of an SVT In addition to possible AVNRT, I suspect he also has frequent PACs and possibly brief atrial tachycardia, which will be less  amenable to ablation though more easily managed with medications  We discussed the indication, rationale, logistics, anticipated benefits, and potential risks of the ablation procedure including but not limited to -- bleed at the groin access site, chest pain, damage to nearby organs such as the diaphragm, lungs, or esophagus, need for a drainage tube, pacemaker, or prolonged hospitalization. I explained that the risk for stroke, heart attack, need for open chest surgery, or even death is very low but not zero. he  expressed understanding and wishes  to proceed.   Coronary artery disease 6 vessel CABG in 2021 Continue aspirin  81 mg, Crestor  5 mg, Jardiance  10 mg, metoprolol  12.5 mg daily      Signed, Brian FORBES Furbish, MD  06/06/2024 11:24 AM    [facility]

## 2024-06-07 ENCOUNTER — Encounter (HOSPITAL_COMMUNITY): Payer: Self-pay | Admitting: Cardiovascular Disease

## 2024-06-09 MED FILL — Midazolam HCl Inj PF 2 MG/2ML (Base Equivalent): INTRAMUSCULAR | Qty: 2 | Status: AC

## 2024-07-07 ENCOUNTER — Ambulatory Visit: Attending: Cardiovascular Disease | Admitting: Cardiovascular Disease

## 2024-07-07 ENCOUNTER — Encounter: Payer: Self-pay | Admitting: Cardiovascular Disease

## 2024-07-07 VITALS — BP 118/72 | HR 67 | Ht 75.0 in | Wt 239.5 lb

## 2024-07-07 DIAGNOSIS — I25110 Atherosclerotic heart disease of native coronary artery with unstable angina pectoris: Secondary | ICD-10-CM

## 2024-07-07 NOTE — Patient Instructions (Signed)
 Medication Instructions:  Your physician recommends that you continue on your current medications as directed. Please refer to the Current Medication list given to you today.  *If you need a refill on your cardiac medications before your next appointment, please call your pharmacy*  Lab Work: None ordered.  If you have labs (blood work) drawn today and your tests are completely normal, you will receive your results only by: MyChart Message (if you have MyChart) OR A paper copy in the mail If you have any lab test that is abnormal or we need to change your treatment, we will call you to review the results.  Testing/Procedures: None ordered.   Follow-Up: At [facility], you and your health needs are our priority.  As part of our continuing mission to provide you with exceptional heart care, our providers are all part of one team.  This team includes your primary Cardiologist (physician) and Advanced Practice Providers or APPs (Physician Assistants and Nurse Practitioners) who all work together to provide you with the care you need, when you need it.  Your next appointment:   12 months with Dr Marko APP

## 2024-07-07 NOTE — Progress Notes (Signed)
 Electrophysiology Office Note:    Date:  07/07/2024   ID:  Brian Mccullough, DOB 1951-02-08, MRN [id number]  PCP:  Garald Karlynn GAILS, MD   [facility] Providers Cardiologist:  Ozell Fell, MD Cardiology APP:  Lelon Glendia DASEN, PA-C  Electrophysiologist:  Eulas FORBES Furbish, MD     Referring MD: Garald Karlynn GAILS, MD   History of Present Illness:    Brian Mccullough is a 73 y.o. male with a medical history significant for CAD with CABG x6, carotid disease, HTN, HLD, referred for management of SVT.      Discussed the use of AI scribe software for clinical note transcription with the patient, who gave verbal consent to proceed.  History of Present Illness Brian Mccullough is a 73 year old male with coronary artery disease who presents with palpitations and supraventricular tachycardia. He was referred for palpitations and SVT.  He has experienced palpitations for most of his life, initially attributing them to exertion during activities like basketball. As an adult, he recognized these sensations as palpitations. Typically, these episodes last less than 30 seconds and resolve spontaneously. However, on April 29, he experienced a prolonged episode lasting about three hours, which was different from his usual episodes. The palpitations occur without a clear trigger, although sometimes bending at the waist may precipitate them. They can also occur while he is at rest.  A 14-day cardiac monitoring revealed episodes of SVT. Notably, on June 7, his cardiac monitor recorded an episode of tachycardia with 2:1 conduction. He has not identified any specific interventions that consistently alleviate the palpitations, although he sometimes tries bearing down to see if it helps.         Today, he reports that he feels well. No new complaints.  EKGs/Labs/Other Studies Reviewed Today:     Echocardiogram:  TTE November 2022 LVEF 60 to 65%.  Left atrium mildly  dilated.   Monitors:  14 day monitor 02/2024  -- my interpretation Sinus rhythm.  Heart rate 45 to 143 bpm, average 72 bpm Multiple episodes of SVT were noted.  1 episode on June 7 at 12:34 PM appears to be an atrial tachycardia with 2-1 conduction  Stress testing:  Exercise tolerance test November 2023 Normal response to exercise    EKG:   EKG Interpretation Date/Time:  Monday July 07 2024 14:13:22 EDT Ventricular Rate:  67 PR Interval:  178 QRS Duration:  108 QT Interval:  392 QTC Calculation: 414 R Axis:   -59  Text Interpretation: Sinus rhythm with Premature atrial complexes in a pattern of bigeminy Left anterior fascicular block When compared with ECG of 06-Jun-2024 13:48, Premature atrial complexes are now Present Confirmed by Furbish Eulas [id number]) on 07/07/2024 2:34:20 PM     Physical Exam:    VS:  BP 118/72   Pulse 67   Ht 6' 3 (1.905 m)   Wt 239 lb 8 oz (108.6 kg)   SpO2 98%   BMI 29.94 kg/m     Wt Readings from Last 3 Encounters:  07/07/24 239 lb 8 oz (108.6 kg)  06/06/24 240 lb (108.9 kg)  05/13/24 243 lb (110.2 kg)     GEN: Well nourished, well developed in no acute distress CARDIAC: RRR, no murmurs, rubs, gallops RESPIRATORY:  Normal work of breathing MUSCULOSKELETAL: no edema    ASSESSMENT & PLAN:     SVT Status post AV node modification June 06, 2024 No recurrence of symptoms since  Frequent PACs Continue metoprolol  12.5 mg  Coronary artery disease 6 vessel CABG in 2021 Continue aspirin  81 mg, Crestor  5 mg, Jardiance  10 mg, metoprolol  12.5 mg daily      Signed, Eulas FORBES Furbish, MD  07/07/2024 2:40 PM    [facility]

## 2024-08-07 ENCOUNTER — Other Ambulatory Visit: Payer: Self-pay | Admitting: Internal Medicine

## 2024-08-14 ENCOUNTER — Ambulatory Visit (INDEPENDENT_AMBULATORY_CARE_PROVIDER_SITE_OTHER): Admitting: Internal Medicine

## 2024-08-14 ENCOUNTER — Encounter: Payer: Self-pay | Admitting: Internal Medicine

## 2024-08-14 VITALS — BP 114/72 | HR 51 | Temp 98.3°F | Ht 75.0 in | Wt 237.2 lb

## 2024-08-14 DIAGNOSIS — I471 Supraventricular tachycardia, unspecified: Secondary | ICD-10-CM | POA: Diagnosis not present

## 2024-08-14 DIAGNOSIS — Z951 Presence of aortocoronary bypass graft: Secondary | ICD-10-CM

## 2024-08-14 DIAGNOSIS — E785 Hyperlipidemia, unspecified: Secondary | ICD-10-CM

## 2024-08-14 DIAGNOSIS — E1165 Type 2 diabetes mellitus with hyperglycemia: Secondary | ICD-10-CM

## 2024-08-14 DIAGNOSIS — Z23 Encounter for immunization: Secondary | ICD-10-CM

## 2024-08-14 DIAGNOSIS — E119 Type 2 diabetes mellitus without complications: Secondary | ICD-10-CM

## 2024-08-14 DIAGNOSIS — M5441 Lumbago with sciatica, right side: Secondary | ICD-10-CM

## 2024-08-14 DIAGNOSIS — I10 Essential (primary) hypertension: Secondary | ICD-10-CM

## 2024-08-14 DIAGNOSIS — E538 Deficiency of other specified B group vitamins: Secondary | ICD-10-CM

## 2024-08-14 DIAGNOSIS — Z7985 Long-term (current) use of injectable non-insulin antidiabetic drugs: Secondary | ICD-10-CM

## 2024-08-14 DIAGNOSIS — M5442 Lumbago with sciatica, left side: Secondary | ICD-10-CM

## 2024-08-14 DIAGNOSIS — G8929 Other chronic pain: Secondary | ICD-10-CM

## 2024-08-14 LAB — COMPREHENSIVE METABOLIC PANEL WITH GFR
ALT: 7 U/L (ref 0–53)
AST: 17 U/L (ref 0–37)
Albumin: 4.1 g/dL (ref 3.5–5.2)
Alkaline Phosphatase: 48 U/L (ref 39–117)
BUN: 17 mg/dL (ref 6–23)
CO2: 25 meq/L (ref 19–32)
Calcium: 9.4 mg/dL (ref 8.4–10.5)
Chloride: 107 meq/L (ref 96–112)
Creatinine, Ser: 1.39 mg/dL (ref 0.40–1.50)
GFR: 50.66 mL/min — ABNORMAL LOW (ref >60.00)
Glucose, Bld: 74 mg/dL (ref 70–99)
Potassium: 4.4 meq/L (ref 3.5–5.1)
Sodium: 140 meq/L (ref 135–145)
Total Bilirubin: 0.7 mg/dL (ref 0.2–1.2)
Total Protein: 7 g/dL (ref 6.0–8.3)

## 2024-08-14 LAB — HEMOGLOBIN A1C: Hgb A1c MFr Bld: 5.7 % (ref 4.6–6.5)

## 2024-08-14 NOTE — Assessment & Plan Note (Signed)
Blue-Emu cream was recommended to use 2-3 times a day ? ?

## 2024-08-14 NOTE — Assessment & Plan Note (Signed)
 On Rosuvastatin  and Zetia 

## 2024-08-14 NOTE — Assessment & Plan Note (Signed)
On Jardiance and Benazepril Hydrate well

## 2024-08-14 NOTE — Assessment & Plan Note (Signed)
 On B12

## 2024-08-14 NOTE — Assessment & Plan Note (Signed)
 On ASA, Lipitor

## 2024-08-14 NOTE — Assessment & Plan Note (Signed)
 On Ozempic  0.5 mg/wk

## 2024-08-14 NOTE — Progress Notes (Signed)
 Subjective:  Patient ID: Brian Mccullough, male    DOB: 1951-05-17  Age: 73 y.o. MRN: [id number]  CC: Medical Management of Chronic Issues (3 Month follow up)   HPI Brian Mccullough presents for DM, SVT - s/p ablation in 05/2024, HTN, CAD  Outpatient Medications Prior to Visit  Medication Sig Dispense Refill   aspirin  81 MG EC tablet Take 81 mg by mouth daily.     benazepril  (LOTENSIN ) 20 MG tablet TAKE 1 TABLET BY MOUTH DAILY 100 tablet 2   cholecalciferol  (VITAMIN D3) 25 MCG (1000 UNIT) tablet Take 1,000 Units by mouth daily.     cyanocobalamin  (VITAMIN B12) 1000 MCG tablet Take 1,000 mcg by mouth daily.     ezetimibe  (ZETIA ) 10 MG tablet TAKE 1 TABLET BY MOUTH EVERY DAY 90 tablet 3   furosemide  (LASIX ) 20 MG tablet TAKE 1 TABLET BY MOUTH AS NEEDED FOR FLUID OR EDEMA. MAX 1 TABLET PER DAY 100 tablet 2   JARDIANCE  10 MG TABS tablet TAKE 1 TABLET BY MOUTH EVERY DAY 30 tablet 11   loratadine  (CLARITIN ) 10 MG tablet Take 10 mg by mouth daily as needed for allergies or rhinitis.     meloxicam  (MOBIC ) 15 MG tablet TAKE 1 TABLET BY MOUTH DAILY AS  NEEDED FOR PAIN 100 tablet 2   metFORMIN  (GLUCOPHAGE ) 500 MG tablet TAKE 1 TABLET BY MOUTH DAILY  WITH BREAKFAST 100 tablet 2   metoprolol  tartrate (LOPRESSOR ) 25 MG tablet TAKE 0.5 TABLETS (12.5 MG TOTAL) BY MOUTH DAILY. CAN TAKE AN ADDITIONAL HALF TABLET DAILY AS NEEDED 90 tablet 3   nitroGLYCERIN  (NITROSTAT ) 0.4 MG SL tablet Place 0.4 mg under the tongue every 5 (five) minutes as needed for chest pain.     rosuvastatin  (CRESTOR ) 5 MG tablet TAKE 1 TABLET (5 MG TOTAL) BY MOUTH DAILY. 90 tablet 2   Semaglutide ,0.25 or 0.5MG /DOS, (OZEMPIC , 0.25 OR 0.5 MG/DOSE,) 2 MG/3ML SOPN Inject 0.5 mg into the skin once a week. 9 mL 2   traMADol  (ULTRAM ) 50 MG tablet TAKE 1-2 TABLETS (50-100 MG TOTAL) BY MOUTH EVERY 6 (SIX) HOURS AS NEEDED FOR MODERATE PAIN OR SEVERE PAIN. 120 tablet 3   traZODone  (DESYREL ) 50 MG tablet TAKE 1/2 TO 1 TABLET BY MOUTH AT BEDTIME AS  NEEDED FOR SLEEP 90 tablet 3   triamcinolone  ointment (KENALOG ) 0.1 % Apply 1 Application topically 2 (two) times daily as needed (rash). 90 g 3   No facility-administered medications prior to visit.    ROS: Review of Systems  Constitutional:  Positive for fatigue. Negative for appetite change and unexpected weight change.  HENT:  Negative for congestion, nosebleeds, sneezing, sore throat and trouble swallowing.   Eyes:  Negative for itching and visual disturbance.  Respiratory:  Negative for cough.   Cardiovascular:  Negative for chest pain, palpitations and leg swelling.  Gastrointestinal:  Negative for abdominal distention, blood in stool, diarrhea and nausea.  Genitourinary:  Negative for frequency and hematuria.  Musculoskeletal:  Positive for arthralgias and back pain. Negative for gait problem, joint swelling and neck pain.  Skin:  Negative for rash.  Neurological:  Negative for dizziness, tremors, speech difficulty and weakness.  Hematological:  Does not bruise/bleed easily.  Psychiatric/Behavioral:  Negative for agitation, dysphoric mood, sleep disturbance and suicidal ideas. The patient is not nervous/anxious.     Objective:  BP 114/72   Pulse (!) 51   Temp 98.3 F (36.8 C)   Ht 6' 3 (1.905 m)   Wt 237  lb 3.2 oz (107.6 kg)   SpO2 96%   BMI 29.65 kg/m   BP Readings from Last 3 Encounters:  08/14/24 114/72  07/07/24 118/72  06/06/24 (!) 163/94    Wt Readings from Last 3 Encounters:  08/14/24 237 lb 3.2 oz (107.6 kg)  07/07/24 239 lb 8 oz (108.6 kg)  06/06/24 240 lb (108.9 kg)    Physical Exam Constitutional:      General: He is not in acute distress.    Appearance: He is well-developed. He is obese.     Comments: NAD  Eyes:     Conjunctiva/sclera: Conjunctivae normal.     Pupils: Pupils are equal, round, and reactive to light.  Neck:     Thyroid : No thyromegaly.     Vascular: No JVD.  Cardiovascular:     Rate and Rhythm: Normal rate and regular  rhythm.     Heart sounds: Normal heart sounds. No murmur heard.    No friction rub. No gallop.  Pulmonary:     Effort: Pulmonary effort is normal. No respiratory distress.     Breath sounds: Normal breath sounds. No wheezing or rales.  Chest:     Chest wall: No tenderness.  Abdominal:     General: Bowel sounds are normal. There is no distension.     Palpations: Abdomen is soft. There is no mass.     Tenderness: There is no abdominal tenderness. There is no guarding or rebound.  Musculoskeletal:        General: No tenderness. Normal range of motion.     Cervical back: Normal range of motion.     Right lower leg: No edema.     Left lower leg: No edema.  Lymphadenopathy:     Cervical: No cervical adenopathy.  Skin:    General: Skin is warm and dry.     Findings: No rash.  Neurological:     Mental Status: He is alert and oriented to person, place, and time.     Cranial Nerves: No cranial nerve deficit.     Motor: No abnormal muscle tone.     Coordination: Coordination normal.     Gait: Gait normal.     Deep Tendon Reflexes: Reflexes are normal and symmetric.  Psychiatric:        Behavior: Behavior normal.        Thought Content: Thought content normal.        Judgment: Judgment normal.     Lab Results  Component Value Date   WBC 5.1 05/09/2024   HGB 13.9 05/09/2024   HCT 43.4 05/09/2024   PLT 218 05/09/2024   GLUCOSE 76 05/09/2024   CHOL 117 04/06/2023   TRIG 110 04/06/2023   HDL 41 04/06/2023   LDLDIRECT 88.1 01/23/2011   LDLCALC 56 04/06/2023   ALT 7 02/11/2024   AST 13 02/11/2024   NA 140 05/09/2024   K 4.6 05/09/2024   CL 105 05/09/2024   CREATININE 1.43 (H) 05/09/2024   BUN 17 05/09/2024   CO2 21 05/09/2024   TSH 0.60 02/05/2023   PSA 0.27 02/05/2023   INR 1.5 (H) 08/04/2020   HGBA1C 5.8 02/11/2024   MICROALBUR <0.2 04/26/2020    EP STUDY Result Date: 06/06/2024 SURGEON: Eulas FORBES Furbish, MD PREPROCEDURE DIAGNOSIS: SVT POSTPROCEDURE DIAGNOSIS: SVT  Dual AV node pathway Frequent, multifocal atrial ectopy PROCEDURES: 1. Comprehensive EP study. 2. Coronary sinus pacing and recording. 3. 3D Mapping of supraventricular tachycardia. 4. Radiofrequency ablation of supraventricular tachycardia. 5. Isuprel  infusion  for arrhythmia induction and maintenance INTRODUCTION: TARO HIDROGO is a 73 y.o. male with a history of symptomatic recurrent SVT who presents today for EP study and radiofrequency ablation. DESCRIPTION OF PROCEDURE: Informed written consent was obtained and the patient was brought to the Electrophysiology Lab in the fasting state. The patient was adequately sedated with intravenous medication as outlined in the anesthesia report. The patient was prepped and draped in the usual sterile fashion. The inguinal areas were anaesthetized using lidocaine . Using ultraound guidance and modified seldinger technique the right femoral vein was accessed x 3. One 6-French and two 8-French sheaths were placed into the right common femoral vein. A Deca polar catheter was advanced to the RA and an electroanatomic map was created of the RA/RV and CS. This catheter was then positioned in the coronary sinus for atrial pacing and recording. Next, a Josephson catheter was positioned at the His bundle for pacing and recording. A Josephson catheter was then advanced to the RV apex for pacing and recording.  Presenting Measurements: The presenting rhythm was sinus rhythm. The PR interval was 183 msec with a QRS of 87 msec and a QT of 369 msec. The average RR interval was 1057 msec. The AH interval was 92 msec and the HV interval was 53 milliseconds.  There was no pre-excitation on surface ECG. EP study: Ventricular pacing was performed which reveals concentric and decremental VA conduction. The VA WCL was 300  msec and a VERP < 500/300 msec. Decremental atrial pacing revealed a WCL of 440 ms. There was no evidence of antegrade dual AV nodal physiology (no PR >RR prior to [facility])  with decremental atrial pacing. Atrial estrastimulus testing was then performed revealing dual AVN physiology with a jump at 600:380. AVN ERP was 600:300 ms. Isuprel  was then started at 2 mcg/min with an observed effect on the sinus CL. Repeat atrial stim was performed without induction of tachycardia. We did elicit single echo beats. 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 Ablation: An 4mm non-irrigated FJ ablation catheter was then advanced to the RA. The His bundle and CS os were annotated on the electroanatomic map. Ablation was performed at the inferior/ventricular aspect of the [place] where a slow pathway potential was observed. With ablation, an accelerated junctional rhythm was observed. During accelerated junction rhythm there was intact VA conduction. Several reinforcing lesions were delivered to this area. Ablation was difficult due to excessive respiratory movement. I exchanged the short sheath for an SL 2 for additional stability, but this provided little benefit. Measurements following ablation: Following ablation, repeat EP study was performed. With atrial pacing, AVWCL was 360 msec. Isuprel  was again infused at up to 4 mcg/min with an adequate acceleration in heart rate response. EP study again confirmed dual AV node physiology with a jump at 600:340 without an inducible tachycardia. Final intervals were: PR 168 msec, QRS 101 msec, QT 378 msec, RR 929 msec. The procedure was considered complete. All catheters were removed from the body. Sheaths were removed and hemostasis achieved with two mynx devices and a perclose device for the long sheath, and manual pressure. EBL<63ml. There were no early apparent complications. CONCLUSIONS: 1. Sinus rhythm upon presentation. 2. Dual AV node physiology, but no inducible arrhythmia at EP study 3. Radiofrequency ablation of the slow  pathway without a strong endpoint 4. No inducible arrhythmias following ablation. 5. Multifocal PACs  were observed -- not a target for ablation 6. No early apparent complications. Eulas FORBES Furbish, MD 06/06/2024 2:12 PM     Assessment & Plan:   Problem List Items Addressed This Visit     B12 deficiency   On B12      CAD s/p CABG x 6 in October 2021   On ASA, Lipitor       Diabetes mellitus type 2, controlled (HCC)   On Ozempic  0.5 mg/wk      Dyslipidemia (Chronic)   On Rosuvastatin  and Zetia       HTN (hypertension)   On Jardiance  and Benazepril  Hydrate well      Low back pain   Blue-Emu cream was recommended to use 2-3 times a day       Other Visit Diagnoses       Immunization due    -  Primary   Relevant Orders   Flu vaccine HIGH DOSE PF(Fluzone Trivalent)         No orders of the defined types were placed in this encounter.     Follow-up: Return in about 3 months (around 11/14/2024) for a follow-up visit.  Marolyn Noel, MD

## 2024-08-15 ENCOUNTER — Ambulatory Visit: Payer: Self-pay | Admitting: Internal Medicine

## 2024-09-07 ENCOUNTER — Other Ambulatory Visit: Payer: Self-pay | Admitting: Internal Medicine

## 2024-10-07 ENCOUNTER — Other Ambulatory Visit: Payer: Self-pay | Admitting: Internal Medicine

## 2024-11-06 NOTE — Assessment & Plan Note (Addendum)
 Pt s/p ablation by Dr Nancey in 2025.  Doing very well with minimal recurrent symptoms.  Continue same therapy.

## 2024-11-06 NOTE — Progress Notes (Signed)
 " Cardiology Office Note:    Date:  11/07/2024   ID:  Brian Mccullough, DOB [birth year], MRN [id number]  PCP:  Garald Karlynn GAILS, MD   [facility] Providers Cardiologist:  Ozell Fell, MD Cardiology APP:  Lelon Glendia DASEN, PA-C  Electrophysiologist:  Eulas FORBES Furbish, MD     Referring MD: Garald Karlynn GAILS, MD   Chief Complaint  Patient presents with   Coronary Artery Disease    History of Present Illness:    Brian Mccullough is a 74 y.o. male with a hx of coronary artery disease, presenting for follow-up evaluation. The patient was initially diagnosed with CAD in 2021 when he presented with an acute inferior STEMI and underwent primary angioplasty of the right coronary artery. He was found to have severe multivessel disease at the time of his emergent catheterization and he ultimately was treated with 6 vessel CABG. Comorbid conditions include hypertension, mixed hyperlipidemia, and type 2 diabetes.   The patient is here alone today.  He is doing really well.  He states that since his SVT ablation his palpitations have been markedly improved.  He still has occasional heart palpitations but no sustained arrhythmia or symptoms to suggest significant rhythm issues.  He denies chest pain, chest pressure, or shortness of breath.  He continues to get out in his yard and enjoys doing yard work but he does not engage much in an exercise program.  He does enjoy walking.  No other concerns today.  The patient is compliant with his medications.   Current Medications: Active Medications[1]   Allergies:   Codeine, Atorvastatin , and Levaquin  [levofloxacin ]   ROS:   Please see the history of present illness.    All other systems reviewed and are negative.  EKGs/Labs/Other Studies Reviewed:    The following studies were reviewed today: Cardiac Studies & Procedures   ______________________________________________________________________________________________ CARDIAC  CATHETERIZATION  CARDIAC CATHETERIZATION 08/03/2020  Conclusion 1.  Acute inferior wall STEMI secondary to thrombotic occlusion of the RCA, treated successfully with balloon angioplasty alone 2.  Severe diffuse multivessel coronary artery disease with severe stenosis of the distal left main, ostial LAD, proximal LAD, ramus intermedius, proximal circumflex, and RCA as detailed above 3.  Mild segmental LV contraction abnormality with basal and mid inferior wall hypokinesis, LVEF estimated at 50 to 55%, mildly elevated LVEDP of 21 mmHg 4. Successful IABP placement under fluoroscopic guidance  Recommendations: Continue intra-aortic balloon pump at one-to-one augmentation, continue tirofiban  infusion, cardiac surgical evaluation tomorrow morning  Findings Coronary Findings Diagnostic  Dominance: Right  Left Main Mid LM to Dist LM lesion is 70% stenosed. The distal left main trifurcates into the LAD, intermediate branch, and left circumflex with moderately severe distal left main stenosis  Left Anterior Descending Prox LAD to Mid LAD lesion is 80% stenosed. The lesion is calcified. There is severe diffuse proximal LAD stenosis.  The mid and distal LAD appear to be suitable targets for bypass grafting.  First Diagonal Branch 1st Diag lesion is 80% stenosed with 80% stenosed side branch in Lat 1st Diag. The lesion is moderately calcified.  Ramus Intermedius Ramus lesion is 75% stenosed.  Left Circumflex Prox Cx lesion is 70% stenosed.  Right Coronary Artery Prox RCA lesion is 95% stenosed. The lesion is heavily thrombotic. Culprit lesion with heavy thrombus Mid RCA lesion is 75% stenosed.  Right Posterior Descending Artery RPDA-1 lesion is 90% stenosed. Tandem severe 90 to 95% stenoses of the PDA are present in the distal PDA is  100% occluded. RPDA-2 lesion is 100% stenosed.  Intervention  Prox RCA lesion Angioplasty CATH LAUNCHER 6FR JR4 guide catheter was inserted. WIRE COUGAR XT  STRL 190CM guidewire used to cross lesion. Balloon angioplasty was performed using a BALLOON SAPPHIRE 2.5X15. Maximum pressure: 8 atm. Inflation time: 30 sec. Post-Intervention Lesion Assessment The intervention was successful. Pre-interventional TIMI flow is 2. Post-intervention TIMI flow is 3. No complications occurred at this lesion. There is a 40% residual stenosis post intervention.   STRESS TESTS  EXERCISE TOLERANCE TEST (ETT) 08/23/2022  Interpretation Summary   Negative ECG stress test for ischemia.   Average exercise capacity (5:00 min:s; 7.0 METS).   Normal HR/BP response to exercise.   ECHOCARDIOGRAM  ECHOCARDIOGRAM COMPLETE 08/16/2021  Narrative ECHOCARDIOGRAM REPORT    Patient Name:   Brian Mccullough Date of Exam: 08/16/2021 Medical Rec #:  [id number]        Height:       75.0 in Accession #:    [id number]       Weight:       230.0 lb Date of Birth:  09/24/1951        BSA:          2.329 m Patient Age:    70 years         BP:           136/75 mmHg Patient Gender: M                HR:           74 bpm. Exam Location:  Inpatient  Procedure: 2D Echo, Cardiac Doppler and Color Doppler  Indications:    CHF  History:        Patient has prior history of Echocardiogram examinations. Prior CABG; Risk Factors:Diabetes, Dyslipidemia and Hypertension.  Sonographer:    Waddell Captain Referring Phys: [phone] AVA SWAYZE  IMPRESSIONS   1. Left ventricular ejection fraction, by estimation, is 60 to 65%. The left ventricle has normal function. The left ventricle has no regional wall motion abnormalities. Left ventricular diastolic parameters were normal. 2. Right ventricular systolic function is normal. The right ventricular size is normal. 3. Left atrial size was mildly dilated. 4. The mitral valve is normal in structure. No evidence of mitral valve regurgitation. No evidence of mitral stenosis. 5. The aortic valve is normal in structure. Aortic valve regurgitation is not  visualized. No aortic stenosis is present. 6. The inferior vena cava is normal in size with greater than 50% respiratory variability, suggesting right atrial pressure of 3 mmHg.  Comparison(s): No significant change from prior study. Prior images reviewed side by side.  FINDINGS Left Ventricle: Left ventricular ejection fraction, by estimation, is 60 to 65%. The left ventricle has normal function. The left ventricle has no regional wall motion abnormalities. The left ventricular internal cavity size was normal in size. There is no left ventricular hypertrophy. Abnormal (paradoxical) septal motion consistent with post-operative status. Left ventricular diastolic parameters were normal.  Right Ventricle: The right ventricular size is normal. No increase in right ventricular wall thickness. Right ventricular systolic function is normal.  Left Atrium: Left atrial size was mildly dilated.  Right Atrium: Right atrial size was normal in size. Prominent Eustachian valve.  Pericardium: There is no evidence of pericardial effusion.  Mitral Valve: The mitral valve is normal in structure. No evidence of mitral valve regurgitation. No evidence of mitral valve stenosis.  Tricuspid Valve: The tricuspid valve is normal in structure. Tricuspid valve regurgitation  is trivial. No evidence of tricuspid stenosis.  Aortic Valve: The aortic valve is normal in structure. Aortic valve regurgitation is not visualized. No aortic stenosis is present. Aortic valve peak gradient measures 6.6 mmHg.  Pulmonic Valve: The pulmonic valve was normal in structure. Pulmonic valve regurgitation is trivial. No evidence of pulmonic stenosis.  Aorta: The aortic root is normal in size and structure.  Venous: The inferior vena cava is normal in size with greater than 50% respiratory variability, suggesting right atrial pressure of 3 mmHg.  IAS/Shunts: No atrial level shunt detected by color flow Doppler.   LEFT VENTRICLE PLAX  2D LVIDd:         5.00 cm      Diastology LVIDs:         4.10 cm      LV e' medial:    5.22 cm/s LV PW:         1.10 cm      LV E/e' medial:  14.5 LV IVS:        1.20 cm      LV e' lateral:   11.00 cm/s LVOT diam:     2.50 cm      LV E/e' lateral: 6.9 LV SV:         105 LV SV Index:   45 LVOT Area:     4.91 cm  LV Volumes (MOD) LV vol d, MOD A2C: 175.0 ml LV vol d, MOD A4C: 184.0 ml LV vol s, MOD A2C: 80.0 ml LV vol s, MOD A4C: 79.5 ml LV SV MOD A2C:     95.0 ml LV SV MOD A4C:     184.0 ml LV SV MOD BP:      101.8 ml  RIGHT VENTRICLE            IVC RV Basal diam:  4.60 cm    IVC diam: 1.90 cm RV Mid diam:    3.20 cm RV S prime:     8.38 cm/s TAPSE (M-mode): 1.9 cm  LEFT ATRIUM             Index        RIGHT ATRIUM           Index LA diam:        4.70 cm 2.02 cm/m   RA Area:     20.90 cm LA Vol (A2C):   77.8 ml 33.41 ml/m  RA Volume:   59.20 ml  25.42 ml/m LA Vol (A4C):   57.3 ml 24.61 ml/m LA Biplane Vol: 67.6 ml 29.03 ml/m AORTIC VALVE AV Area (Vmax): 4.68 cm AV Vmax:        128.00 cm/s AV Peak Grad:   6.6 mmHg LVOT Vmax:      122.00 cm/s LVOT Vmean:     82.100 cm/s LVOT VTI:       0.213 m  AORTA Ao Root diam: 3.60 cm Ao Asc diam:  3.60 cm  MITRAL VALVE MV Area (PHT): 3.74 cm    SHUNTS MV Decel Time: 203 msec    Systemic VTI:  0.21 m MV E velocity: 75.80 cm/s  Systemic Diam: 2.50 cm MV A velocity: 68.30 cm/s MV E/A ratio:  1.11  Mihai Croitoru MD Electronically signed by Jerel Balding MD Signature Date/Time: 08/16/2021/4:54:53 PM    Final   TEE  ECHO INTRAOPERATIVE TEE 08/04/2020  Narrative *INTRAOPERATIVE TRANSESOPHAGEAL REPORT *    Patient Name:   ARIHAAN BELLUCCI Date of Exam: 08/04/2020 Medical Rec #:  [id number]        Height:       76.0 in Accession #:    [id number]       Weight:       230.4 lb Date of Birth:  09-29-1951        BSA:          2.35 m Patient Age:    69 years         BP:           110/82 mmHg Patient Gender: M                 HR:           62 bpm. Exam Location:  Anesthesiology  Transesophogeal exam was perform intraoperatively during surgical procedure. Patient was closely monitored under general anesthesia during the entirety of examination.  Indications:     Coronary Artery Disease Sonographer:     Lauraine Pilot RDCS Performing Phys: Debby Like MD  Complications: No known complications during this procedure. POST-OP IMPRESSIONS - Left Ventricle: The left ventricle is unchanged from pre-bypass. - Right Ventricle: The right ventricle appears unchanged from pre-bypass. - Aorta: The aorta appears unchanged from pre-bypass. - Left Atrium: The left atrium appears unchanged from pre-bypass. - Left Atrial Appendage: The left atrial appendage appears unchanged from pre-bypass. - Aortic Valve: The aortic valve appears unchanged from pre-bypass. - Mitral Valve: The mitral valve appears unchanged from pre-bypass. - Tricuspid Valve: The tricuspid valve appears unchanged from pre-bypass. - Interatrial Septum: The interatrial septum appears unchanged from pre-bypass. - Interventricular Septum: The interventricular septum appears unchanged from pre-bypass. - Pericardium: The pericardium appears unchanged from pre-bypass.  PRE-OP FINDINGS Left Ventricle: The left ventricle has low normal systolic function, with an ejection fraction of 50-55%. The cavity size was normal. There is mildly increased left ventricular wall thickness.  Right Ventricle: The right ventricle has normal systolic function. The cavity was normal. There is no increase in right ventricular wall thickness.  Left Atrium: Left atrial size was normal in size.  Right Atrium: Right atrial size was normal in size.  Interatrial Septum: No atrial level shunt detected by color flow Doppler. There is left bowing of the interatrial septum, suggestive of elevated right atrial pressure.  Pericardium: There is no evidence of pericardial  effusion.  Mitral Valve: The mitral valve is normal in structure. Mitral valve regurgitation is trivial by color flow Doppler. There is No evidence of mitral stenosis.  Tricuspid Valve: The tricuspid valve was normal in structure. Tricuspid valve regurgitation is trivial by color flow Doppler.  Aortic Valve: The aortic valve is tricuspid Aortic valve regurgitation was not visualized by color flow Doppler. There is no evidence of aortic valve stenosis.  Pulmonic Valve: The pulmonic valve was normal in structure. Pulmonic valve regurgitation is trivial by color flow Doppler.   Aorta: IABP in place.   Debby Like MD Electronically signed by Debby Like MD Signature Date/Time: 08/06/2020/9:11:17 AM    Final  MONITORS  LONG TERM MONITOR (3-14 DAYS) 03/26/2024  Narrative   Predominant rhythm was normal sinus rhythm with an average heart rate of 72 bpm.   3 episodes of NSVT lasting as long as 11 beats at a rate of 182 bpm   11 episodes of SVT lasting as long as 10 minutes at a heart rate of 203 bpm.  This corresponded to patient's complaints of palpitations   Frequent PACs, atrial couplets, atrial triplets   Rare PVCs, ventricular couplets  Patch Wear Time:  13 days and 20 hours (2025-05-26T09:22:45-0400 to 2025-06-09T06:00:53-0400)  Patient had a min HR of 45 bpm, max HR of 203 bpm, and avg HR of 72 bpm. Predominant underlying rhythm was Sinus Rhythm. First Degree AV Block was present. 3 Ventricular Tachycardia runs occurred, the run with the fastest interval lasting 11 beats with a max rate of 182 bpm (avg 150 bpm); the run with the fastest interval was also the longest. 11 Supraventricular Tachycardia runs occurred, the run with the fastest interval lasting 6 beats with a max rate of 203 bpm, the longest lasting 2 mins 0 secs with an avg rate of 121 bpm. Some episodes of Supraventricular Tachycardia may be possible Atrial Tachycardia with variable block. True duration of  Supraventricular Tachycardia difficult to ascertain due to artifact. Ventricular Tachycardia and Supraventricular Tachycardia were detected within +/- 45 seconds of symptomatic patient event(s). Isolated SVEs were frequent (18.3%, [id number]), SVE Couplets were occasional (1.2%, 8641), and SVE Triplets were rare (<1.0%, 360). Isolated VEs were rare (<1.0%), VE Couplets were rare (<1.0%), and no VE Triplets were present.       ______________________________________________________________________________________________      EKG:        Recent Labs: 05/09/2024: Hemoglobin 13.9; Platelets 218 08/14/2024: ALT 7; BUN 17; Creatinine, Ser 1.39; Potassium 4.4; Sodium 140  Recent Lipid Panel    Component Value Date/Time   CHOL 117 04/06/2023 1049   TRIG 110 04/06/2023 1049   HDL 41 04/06/2023 1049   CHOLHDL 2.9 04/06/2023 1049   CHOLHDL 4.6 08/04/2020 0419   VLDL 14 08/04/2020 0419   LDLCALC 56 04/06/2023 1049   LDLDIRECT 88.1 01/23/2011 0829          Physical Exam:    VS:  BP (!) 124/90 (BP Location: Right Arm, Patient Position: Sitting, Cuff Size: Large)   Pulse 66   Ht 6' 3 (1.905 m)   Wt 241 lb 9.6 oz (109.6 kg)   SpO2 95%   BMI 30.20 kg/m     Wt Readings from Last 3 Encounters:  11/07/24 241 lb 9.6 oz (109.6 kg)  08/14/24 237 lb 3.2 oz (107.6 kg)  07/07/24 239 lb 8 oz (108.6 kg)     GEN:  Well nourished, well developed in no acute distress HEENT: Normal NECK: No JVD; No carotid bruits LYMPHATICS: No lymphadenopathy CARDIAC: RRR, no murmurs, rubs, gallops RESPIRATORY:  Clear to auscultation without rales, wheezing or rhonchi  ABDOMEN: Soft, non-tender, non-distended MUSCULOSKELETAL:  No edema; No deformity  SKIN: Warm and dry NEUROLOGIC:  Alert and oriented x 3 PSYCHIATRIC:  Normal affect   Assessment & Plan Coronary artery disease involving native coronary artery of native heart with unstable angina pectoris (HCC) Stable on current medical regimen.  Continue  aspirin , benazepril  in the setting of CAD and diabetes, lipid-lowering therapies, and metoprolol  for antianginal treatment.  Follow-up 1 year. Mixed hyperlipidemia Low-dose rosuvastatin  in the setting of intolerance to higher dose statins.  Continue ezetimibe .  Last lipids in 2024 were excellent with LDL 56.  Will order repeat labs for surveillance. Primary hypertension Blood pressure controlled on current regimen.  Encouraged increased aerobic activity and regular exercise.  Continue current medications. SVT (supraventricular tachycardia) Pt s/p ablation by Dr Nancey in 2025.  Doing very well with minimal recurrent symptoms.  Continue same therapy.  Mr. Mele seems to be doing very well from a cardiovascular perspective.  His recent hemoglobin A1c is 5.7.  He is on appropriate medications for cardiovascular risk reduction reviewed  above.  I will plan to see him back in 1 year for follow-up evaluation.          Medication Adjustments/Labs and Tests Ordered: Current medicines are reviewed at length with the patient today.  Concerns regarding medicines are outlined above.  Orders Placed This Encounter  Procedures   Lipid panel   Hepatic function panel   No orders of the defined types were placed in this encounter.   Patient Instructions  Medication Instructions:  No medication changes were made at this visit. Continue current regimen.   *If you need a refill on your cardiac medications before your next appointment, please call your pharmacy*  Lab Work: To be completed at your earliest convenience: lipid panel and LFT  If you have labs (blood work) drawn today and your tests are completely normal, you will receive your results only by: MyChart Message (if you have MyChart) OR A paper copy in the mail If you have any lab test that is abnormal or we need to change your treatment, we will call you to review the results.  Testing/Procedures: None ordered today.  Follow-Up: At [facility], you and your health needs are our priority.  As part of our continuing mission to provide you with exceptional heart care, our providers are all part of one team.  This team includes your primary Cardiologist (physician) and Advanced Practice Providers or APPs (Physician Assistants and Nurse Practitioners) who all work together to provide you with the care you need, when you need it.  Your next appointment:   1 year(s)  Provider:   Ozell Fell, MD     Signed, Ozell Fell, MD  11/07/2024 11:59 AM    [facility]     [1]  Current Meds  Medication Sig   aspirin  81 MG EC tablet Take 81 mg by mouth daily.   benazepril  (LOTENSIN ) 20 MG tablet TAKE 1 TABLET BY MOUTH DAILY   cholecalciferol  (VITAMIN D3) 25 MCG (1000 UNIT) tablet Take 1,000 Units by mouth daily.   cyanocobalamin  (VITAMIN B12) 1000 MCG tablet Take 1,000 mcg by mouth daily.   ezetimibe  (ZETIA ) 10 MG tablet TAKE 1 TABLET BY MOUTH EVERY DAY   furosemide  (LASIX ) 20 MG tablet TAKE 1 TABLET BY MOUTH AS NEEDED FOR FLUID OR EDEMA. MAX 1 TABLET BY MOUTH DAILY   JARDIANCE  10 MG TABS tablet TAKE 1 TABLET BY MOUTH EVERY DAY   loratadine  (CLARITIN ) 10 MG tablet Take 10 mg by mouth daily as needed for allergies or rhinitis.   meloxicam  (MOBIC ) 15 MG tablet TAKE 1 TABLET BY MOUTH DAILY AS  NEEDED FOR PAIN   metFORMIN  (GLUCOPHAGE ) 500 MG tablet TAKE 1 TABLET BY MOUTH DAILY  WITH BREAKFAST   metoprolol  tartrate (LOPRESSOR ) 25 MG tablet TAKE 0.5 TABLETS (12.5 MG TOTAL) BY MOUTH DAILY. CAN TAKE AN ADDITIONAL HALF TABLET DAILY AS NEEDED   nitroGLYCERIN  (NITROSTAT ) 0.4 MG SL tablet Place 0.4 mg under the tongue every 5 (five) minutes as needed for chest pain.   rosuvastatin  (CRESTOR ) 5 MG tablet TAKE 1 TABLET (5 MG TOTAL) BY MOUTH DAILY.   Semaglutide ,0.25 or 0.5MG /DOS, (OZEMPIC , 0.25 OR 0.5 MG/DOSE,) 2 MG/3ML SOPN Inject 0.5 mg into the skin once a week.   traMADol  (ULTRAM ) 50 MG tablet TAKE 1-2 TABLETS (50-100 MG  TOTAL) BY MOUTH EVERY 6 (SIX) HOURS AS NEEDED FOR MODERATE PAIN OR SEVERE PAIN.   traZODone  (DESYREL ) 50 MG tablet TAKE 1/2 TO 1 TABLET BY MOUTH AT BEDTIME AS  NEEDED FOR SLEEP   triamcinolone  ointment (KENALOG ) 0.1 % Apply 1 Application topically 2 (two) times daily as needed (rash).   "

## 2024-11-07 ENCOUNTER — Ambulatory Visit: Attending: Cardiovascular Disease | Admitting: Cardiovascular Disease

## 2024-11-07 ENCOUNTER — Encounter: Payer: Self-pay | Admitting: Cardiovascular Disease

## 2024-11-07 VITALS — BP 124/90 | HR 66 | Ht 75.0 in | Wt 241.6 lb

## 2024-11-07 DIAGNOSIS — I471 Supraventricular tachycardia, unspecified: Secondary | ICD-10-CM | POA: Diagnosis not present

## 2024-11-07 DIAGNOSIS — E782 Mixed hyperlipidemia: Secondary | ICD-10-CM

## 2024-11-07 DIAGNOSIS — I25110 Atherosclerotic heart disease of native coronary artery with unstable angina pectoris: Secondary | ICD-10-CM | POA: Diagnosis not present

## 2024-11-07 DIAGNOSIS — I10 Essential (primary) hypertension: Secondary | ICD-10-CM

## 2024-11-07 NOTE — Patient Instructions (Signed)
 Medication Instructions:  No medication changes were made at this visit. Continue current regimen.   *If you need a refill on your cardiac medications before your next appointment, please call your pharmacy*  Lab Work: To be completed at your earliest convenience: lipid panel and LFT  If you have labs (blood work) drawn today and your tests are completely normal, you will receive your results only by: MyChart Message (if you have MyChart) OR A paper copy in the mail If you have any lab test that is abnormal or we need to change your treatment, we will call you to review the results.  Testing/Procedures: None ordered today.  Follow-Up: At [facility], you and your health needs are our priority.  As part of our continuing mission to provide you with exceptional heart care, our providers are all part of one team.  This team includes your primary Cardiologist (physician) and Advanced Practice Providers or APPs (Physician Assistants and Nurse Practitioners) who all work together to provide you with the care you need, when you need it.  Your next appointment:   1 year(s)  Provider:   Ozell Fell, MD

## 2024-11-07 NOTE — Assessment & Plan Note (Addendum)
 Blood pressure controlled on current regimen.  Encouraged increased aerobic activity and regular exercise.  Continue current medications.

## 2024-11-12 ENCOUNTER — Ambulatory Visit: Admitting: Cardiovascular Disease

## 2024-11-14 LAB — HEPATIC FUNCTION PANEL
ALT: 8 [IU]/L (ref 0–44)
AST: 15 [IU]/L (ref 0–40)
Albumin: 4.2 g/dL (ref 3.8–4.8)
Alkaline Phosphatase: 58 [IU]/L (ref 47–123)
Bilirubin Total: 0.8 mg/dL (ref 0.0–1.2)
Total Protein: 6.6 g/dL (ref 6.0–8.5)

## 2024-11-14 LAB — LIPID PANEL
Chol/HDL Ratio: 2.5 ratio (ref 0.0–5.0)
Cholesterol, Total: 119 mg/dL (ref 100–199)
HDL: 48 mg/dL
LDL Chol Calc (NIH): 56 mg/dL (ref 0–99)
Triglycerides: 72 mg/dL (ref 0–149)
VLDL Cholesterol Cal: 15 mg/dL (ref 5–40)

## 2024-11-17 ENCOUNTER — Ambulatory Visit: Admitting: Internal Medicine

## 2024-12-10 ENCOUNTER — Ambulatory Visit
